# Patient Record
Sex: Female | Born: 1935 | Race: White | Smoking: Former smoker
Health system: Southern US, Community
[De-identification: ages and names within clinical notes are randomized; demographics above are authoritative.]

## PROBLEM LIST (undated history)

## (undated) DIAGNOSIS — I2510 Atherosclerotic heart disease of native coronary artery without angina pectoris: Secondary | ICD-10-CM

## (undated) DIAGNOSIS — Z720 Tobacco use: Secondary | ICD-10-CM

## (undated) DIAGNOSIS — N179 Acute kidney failure, unspecified: Secondary | ICD-10-CM

## (undated) DIAGNOSIS — J449 Chronic obstructive pulmonary disease, unspecified: Secondary | ICD-10-CM

## (undated) DIAGNOSIS — I5043 Acute on chronic combined systolic (congestive) and diastolic (congestive) heart failure: Secondary | ICD-10-CM

## (undated) DIAGNOSIS — U071 COVID-19: Secondary | ICD-10-CM

## (undated) DIAGNOSIS — E119 Type 2 diabetes mellitus without complications: Secondary | ICD-10-CM

## (undated) DIAGNOSIS — I10 Essential (primary) hypertension: Secondary | ICD-10-CM

## (undated) DIAGNOSIS — I350 Nonrheumatic aortic (valve) stenosis: Secondary | ICD-10-CM

## (undated) DIAGNOSIS — E785 Hyperlipidemia, unspecified: Secondary | ICD-10-CM

## (undated) DIAGNOSIS — H548 Legal blindness, as defined in USA: Secondary | ICD-10-CM

## (undated) DIAGNOSIS — K5792 Diverticulitis of intestine, part unspecified, without perforation or abscess without bleeding: Secondary | ICD-10-CM

## (undated) DIAGNOSIS — K219 Gastro-esophageal reflux disease without esophagitis: Secondary | ICD-10-CM

## (undated) HISTORY — DX: Nonrheumatic aortic (valve) stenosis: I35.0

## (undated) HISTORY — PX: APPENDECTOMY: SHX54

## (undated) HISTORY — DX: Hyperlipidemia, unspecified: E78.5

## (undated) HISTORY — DX: Acute kidney failure, unspecified: N17.9

## (undated) HISTORY — DX: Diverticulitis of intestine, part unspecified, without perforation or abscess without bleeding: K57.92

## (undated) HISTORY — DX: Gastro-esophageal reflux disease without esophagitis: K21.9

## (undated) HISTORY — DX: Atherosclerotic heart disease of native coronary artery without angina pectoris: I25.10

## (undated) HISTORY — DX: Essential (primary) hypertension: I10

## (undated) HISTORY — DX: Acute on chronic combined systolic (congestive) and diastolic (congestive) heart failure: I50.43

## (undated) HISTORY — DX: Type 2 diabetes mellitus without complications: E11.9

## (undated) HISTORY — DX: Chronic obstructive pulmonary disease, unspecified: J44.9

## (undated) HISTORY — PX: ABDOMINAL HYSTERECTOMY: SHX81

## (undated) HISTORY — PX: CATARACT EXTRACTION, BILATERAL: SHX1313

## (undated) HISTORY — DX: Tobacco use: Z72.0

## (undated) HISTORY — PX: BACK SURGERY: SHX140

## (undated) HISTORY — PX: CHOLECYSTECTOMY: SHX55

## (undated) HISTORY — DX: COVID-19: U07.1

## (undated) HISTORY — DX: Legal blindness, as defined in USA: H54.8

---

## 2014-05-03 DIAGNOSIS — I359 Nonrheumatic aortic valve disorder, unspecified: Secondary | ICD-10-CM | POA: Insufficient documentation

## 2014-05-03 DIAGNOSIS — E559 Vitamin D deficiency, unspecified: Secondary | ICD-10-CM | POA: Insufficient documentation

## 2014-05-03 DIAGNOSIS — J449 Chronic obstructive pulmonary disease, unspecified: Secondary | ICD-10-CM | POA: Insufficient documentation

## 2014-05-03 DIAGNOSIS — E7800 Pure hypercholesterolemia, unspecified: Secondary | ICD-10-CM | POA: Insufficient documentation

## 2014-05-03 DIAGNOSIS — M2392 Unspecified internal derangement of left knee: Secondary | ICD-10-CM | POA: Insufficient documentation

## 2014-05-03 DIAGNOSIS — E119 Type 2 diabetes mellitus without complications: Secondary | ICD-10-CM | POA: Insufficient documentation

## 2014-05-03 DIAGNOSIS — J4489 Other specified chronic obstructive pulmonary disease: Secondary | ICD-10-CM | POA: Insufficient documentation

## 2014-05-03 DIAGNOSIS — I10 Essential (primary) hypertension: Secondary | ICD-10-CM | POA: Insufficient documentation

## 2014-05-03 DIAGNOSIS — K219 Gastro-esophageal reflux disease without esophagitis: Secondary | ICD-10-CM | POA: Insufficient documentation

## 2014-08-13 DIAGNOSIS — M23309 Other meniscus derangements, unspecified meniscus, unspecified knee: Secondary | ICD-10-CM | POA: Insufficient documentation

## 2014-09-10 ENCOUNTER — Encounter: Payer: Self-pay | Admitting: Cardiovascular Disease

## 2014-09-10 ENCOUNTER — Ambulatory Visit (INDEPENDENT_AMBULATORY_CARE_PROVIDER_SITE_OTHER): Payer: Medicare Other | Admitting: Cardiovascular Disease

## 2014-09-10 VITALS — BP 140/80 | HR 68 | Ht 60.0 in | Wt 174.0 lb

## 2014-09-10 DIAGNOSIS — Z01810 Encounter for preprocedural cardiovascular examination: Secondary | ICD-10-CM

## 2014-09-10 DIAGNOSIS — I10 Essential (primary) hypertension: Secondary | ICD-10-CM

## 2014-09-10 DIAGNOSIS — R011 Cardiac murmur, unspecified: Secondary | ICD-10-CM

## 2014-09-10 NOTE — Progress Notes (Signed)
History of Present Illness: 81 yo female with history of DM, COPD, HTN, HLD, GERD and aortic valve disease referred today as a new patient for cardiac evaluation prior to planned left knee replacement. She has been evaluated by Dr. Noemi Chapel and surgery is being discussed but not yet arranged. She has well controlled BP, DM and lipids per pt. No known heart disease. She has been told for several years that she has a heart murmur. No recent echo. She denies any chest pain, SOB, palpitations, near syncope or syncope. She feels well. Only limited by chronic back pain and knee pain. She has smoked 1 ppd for 70 years.   Primary Care Physician: Bennie Dallas   Past Medical History  Diagnosis Date  . Diabetes mellitus   . COPD (chronic obstructive pulmonary disease)   . Hyperlipidemia   . HTN (hypertension)   . Tobacco abuse     Past Surgical History  Procedure Laterality Date  . Appendectomy    . Back surgery    . Cholecystectomy      Current Outpatient Prescriptions  Medication Sig Dispense Refill  . acetaminophen (TYLENOL) 325 MG tablet Take 650 mg by mouth every 6 (six) hours as needed (SEVERE PAIN).    [name] amLODipine (NORVASC) 10 MG tablet Take 10 mg by mouth.   1  . aspirin 81 MG tablet Take 81 mg by mouth daily.    [name] atenolol (TENORMIN) 100 MG tablet Take 100 mg by mouth daily.   1  . atorvastatin (LIPITOR) 20 MG tablet Take 20 mg by mouth daily at 6 PM.   4  . Cholecalciferol (VITAMIN D-3 PO) Take 5,000 Units by mouth daily.    . Coenzyme Q10-Fish Oil-Vit E (CO-Q 10 OMEGA-3 FISH OIL PO) Take 400 mg by mouth daily.    . fenofibrate 160 MG tablet Take 160 mg by mouth daily.   7  . losartan (COZAAR) 100 MG tablet Take 100 mg by mouth daily.   1  . metFORMIN (GLUCOPHAGE) 500 MG tablet Take 500 mg by mouth 2 (two) times daily.   1  . Multiple Vitamins-Minerals (WOMENS 50+ MULTI VITAMIN/MIN PO) Take 1 tablet by mouth daily. CENTRUM SILVER    . PROAIR HFA 108 (90 BASE) MCG/ACT inhaler  Inhale 2 puffs into the lungs as needed (FOR ALLERGIES). ALLERGIES  3  . terazosin (HYTRIN) 10 MG capsule Take 10 mg by mouth at bedtime.   1  . traMADol (ULTRAM) 50 MG tablet Take by mouth every 6 (six) hours as needed for moderate pain.   0   No current facility-administered medications for this visit.    Allergies  Allergen Reactions  . Amlodipine Besy-Benazepril Hcl Other (See Comments)    cough  . Hctz [Hydrochlorothiazide] Other (See Comments)    History   Social History  . Marital Status: Widowed    Spouse Name: N/A    Number of Children: 2  . Years of Education: N/A   Occupational History  . Retired    Social History Main Topics  . Smoking status: Current Every Day Smoker -- 1.00 packs/day for 70 years    Types: Cigarettes  . Smokeless tobacco: Not on file  . Alcohol Use: No  . Drug Use: No  . Sexual Activity: Not on file   Other Topics Concern  . Not on file   Social History Narrative  . No narrative on file    Family History  Problem Relation Age of Onset  .  Stroke Brother   . Diabetes Mother   . Coronary artery disease Neg Hx     Review of Systems:  As stated in the HPI and otherwise negative.   BP 140/80 mmHg  Pulse 68  Ht 5' (1.524 m)  Wt 174 lb (78.926 kg)  BMI 33.98 kg/m2  SpO2 97%  Physical Examination: General: Well developed, well nourished, NAD HEENT: OP clear, mucus membranes moist SKIN: warm, dry. No rashes. Neuro: No focal deficits Musculoskeletal: Muscle strength 5/5 all ext Psychiatric: Mood and affect normal Neck: No JVD, no carotid bruits, no thyromegaly, no lymphadenopathy. Lungs:Clear bilaterally, no wheezes, rhonci, crackles Cardiovascular: Regular rate and rhythm. Systolic murmur. No gallops or rubs. Abdomen:Soft. Bowel sounds present. Non-tender.  Extremities: No lower extremity edema. Pulses are 2 + in the bilateral DP/PT.  EKG: NSR, rate 68 bpm. Non-specific ST abnormality.   Assessment and Plan:   1.  Pre-operative cardiovascular examination: She has risk factors for CAD including 70 years of tobacco abuse, DM, HTN, HLD and advanced age. She has a planned left knee replacement. I think she needs ischemic testing before her surgical procedure. Will arrange Lexiscan stress myoview. She cannot walk on a treadmill due to knee pain and back pain. I will review the studies and then make formal recommendations for her risk.   2. Cardiac murmur: Will arrange echo to assess. This is a systolic murmur and sounds most c/w an aortic stenosis etiology but the quality is slightly different over the left precordium. There may also be mitral regurgitation.   3. Tobacco abuse: Smoking cessation recommended.   4. HTN: BP controlled. No changes today.

## 2014-09-10 NOTE — Patient Instructions (Signed)
Your physician recommends that you schedule a follow-up appointment in: about 5-6 weeks.   Your physician has requested that you have an echocardiogram. Echocardiography is a painless test that uses sound waves to create images of your heart. It provides your doctor with information about the size and shape of your heart and how well your heart's chambers and valves are working. This procedure takes approximately one hour. There are no restrictions for this procedure.  Your physician has requested that you have a lexiscan myoview. For further information please visit [email or web address]. Please follow instruction sheet, as given.

## 2014-09-13 ENCOUNTER — Ambulatory Visit (HOSPITAL_BASED_OUTPATIENT_CLINIC_OR_DEPARTMENT_OTHER): Payer: Medicare Other | Admitting: Radiology

## 2014-09-13 ENCOUNTER — Ambulatory Visit (HOSPITAL_COMMUNITY): Payer: Medicare Other | Attending: Cardiovascular Disease | Admitting: Radiology

## 2014-09-13 DIAGNOSIS — E119 Type 2 diabetes mellitus without complications: Secondary | ICD-10-CM | POA: Diagnosis not present

## 2014-09-13 DIAGNOSIS — Z720 Tobacco use: Secondary | ICD-10-CM | POA: Diagnosis not present

## 2014-09-13 DIAGNOSIS — Z01810 Encounter for preprocedural cardiovascular examination: Secondary | ICD-10-CM

## 2014-09-13 DIAGNOSIS — I10 Essential (primary) hypertension: Secondary | ICD-10-CM

## 2014-09-13 DIAGNOSIS — I2510 Atherosclerotic heart disease of native coronary artery without angina pectoris: Secondary | ICD-10-CM | POA: Insufficient documentation

## 2014-09-13 DIAGNOSIS — R011 Cardiac murmur, unspecified: Secondary | ICD-10-CM

## 2014-09-13 DIAGNOSIS — Z01818 Encounter for other preprocedural examination: Secondary | ICD-10-CM | POA: Diagnosis not present

## 2014-09-13 DIAGNOSIS — J449 Chronic obstructive pulmonary disease, unspecified: Secondary | ICD-10-CM | POA: Insufficient documentation

## 2014-09-13 DIAGNOSIS — E785 Hyperlipidemia, unspecified: Secondary | ICD-10-CM | POA: Diagnosis not present

## 2014-09-13 MED ORDER — TECHNETIUM TC 99M SESTAMIBI GENERIC - CARDIOLITE
11.0000 | Freq: Once | INTRAVENOUS | Status: AC | PRN
  Administered 2014-09-13: 11 via INTRAVENOUS

## 2014-09-13 MED ORDER — TECHNETIUM TC 99M SESTAMIBI GENERIC - CARDIOLITE
33.0000 | Freq: Once | INTRAVENOUS | Status: AC | PRN
  Administered 2014-09-13: 33 via INTRAVENOUS

## 2014-09-13 MED ORDER — REGADENOSON 0.4 MG/5ML IV SOLN
0.4000 mg | Freq: Once | INTRAVENOUS | Status: AC
  Administered 2014-09-13: 0.4 mg via INTRAVENOUS

## 2014-09-13 NOTE — Progress Notes (Signed)
[facility] [place], [place] [address] [phone]    Cardiology Nuclear Med Study  [place] is a 81 y.o. female     MRN : [id number]     DOB: 1927/12/16  Procedure Date: 09/13/2014  Nuclear Med Background Indication for Stress Test:  Evaluation for Ischemia and Surgical Clearance History:  CAD; Cardiac Risk Factors: Hypertension, Lipids, NIDDM, Smoker and COPD  Symptoms: NO SYMPTOMS   Nuclear Pre-Procedure Caffeine/Decaff Intake:  None NPO After: 5:00am   Lungs:  clear O2 Sat: 98% on room air. IV 0.9% NS with Angio Cath:  22g  IV Site: L Antecubital  IV Started by:  Ileene Hutchinson, EMT-P  Chest Size (in):  38 Cup Size: D  Height: 5' (1.524 m)  Weight:  174 lb (78.926 kg)  BMI:  Body mass index is 33.98 kg/(m^2). Tech Comments:  Withheld tenormin    Nuclear Med Study 1 or 2 day study: 1 day  Stress Test Type:  Lexiscan  Reading MD: n/a  Order Authorizing Provider:  Dr.C.McAlhany  Resting Radionuclide: Technetium 62m Sestamibi  Resting Radionuclide Dose: 11.0 mCi   Stress Radionuclide:  Technetium 48m Sestamibi  Stress Radionuclide Dose: 33.0 mCi           Stress Protocol Rest HR: 74 Stress HR: 88  Rest BP: 168/73 Stress BP: 144/53  Exercise Time (min): n/a METS: n/a   Predicted Max HR: 136 bpm % Max HR: 64.71 bpm Rate Pressure Product: 13728   Dose of Adenosine (mg):  n/a Dose of Lexiscan: 0.4 mg  Dose of Atropine (mg): n/a Dose of Dobutamine: n/a mcg/kg/min (at max HR)  Stress Test Technologist: Glade Lloyd, BS-ES  Nuclear Technologist:  Earl Many, CNMT     Rest Procedure:  Myocardial perfusion imaging was performed at rest 45 minutes following the intravenous administration of Technetium 29m Sestamibi. Rest ECG: NSR - Normal EKG  Stress Procedure:  The patient received IV Lexiscan 0.4 mg over 15-seconds.  Technetium 28m Sestamibi injected at 30-seconds.  Quantitative spect images were obtained after  a 45 minute delay.  During the infusion of Lexiscan the patient complained of SOB that resolved in recovery.  Stress ECG: No significant change from baseline ECG  QPS Raw Data Images:  Normal; no motion artifact; normal heart/lung ratio. Stress Images:  Decreased uptake in the distal anterior and lateral walls and in the true apex.  Rest Images:  Normal homogeneous uptake in all areas of the myocardium. Subtraction (SDS):  SDS 5.  Transient Ischemic Dilatation (Normal <1.22):  1.31 Lung/Heart Ratio (Normal <0.45):  0.31  Quantitative Gated Spect Images QGS EDV:  112 ml QGS ESV:  48 ml  Impression Exercise Capacity:  Lexiscan with no exercise. BP Response:  Normal blood pressure response. Clinical Symptoms:  There is dyspnea. ECG Impression:  No significant ST segment change suggestive of ischemia. Comparison with Prior Nuclear Study: No images to compare  Overall Impression:  Intermediate risk stress nuclear study there is smal area moderate severity reversible defect in the distal anterior and lateral walls and in the apex. SDS 5.   LV Ejection Fraction: 58%.  LV Wall Motion:  NL LV Function; NL Wall Motion    Dorothy Spark 09/13/2014'

## 2014-09-13 NOTE — Progress Notes (Signed)
Echocardiogram performed.  

## 2014-09-18 ENCOUNTER — Ambulatory Visit (INDEPENDENT_AMBULATORY_CARE_PROVIDER_SITE_OTHER): Payer: Medicare Other | Admitting: Cardiovascular Disease

## 2014-09-18 ENCOUNTER — Encounter: Payer: Self-pay | Admitting: Cardiovascular Disease

## 2014-09-18 VITALS — BP 150/72 | HR 68 | Ht 60.0 in | Wt 172.8 lb

## 2014-09-18 DIAGNOSIS — I350 Nonrheumatic aortic (valve) stenosis: Secondary | ICD-10-CM

## 2014-09-18 DIAGNOSIS — Z01810 Encounter for preprocedural cardiovascular examination: Secondary | ICD-10-CM

## 2014-09-18 DIAGNOSIS — Z720 Tobacco use: Secondary | ICD-10-CM

## 2014-09-18 DIAGNOSIS — I10 Essential (primary) hypertension: Secondary | ICD-10-CM

## 2014-09-18 NOTE — Patient Instructions (Signed)
Your physician recommends that you schedule a follow-up appointment in: 3 months. Scheduled for Dec 25, 2014 at 9:00

## 2014-09-18 NOTE — Progress Notes (Signed)
History of Present Illness: 81 yo female with history of DM, COPD, HTN, HLD, GERD and aortic valve disease here today for cardiac follow up. I saw her 09/10/14 as a new patient for cardiac evaluation prior to planned left knee replacement. She has been evaluated by Dr. Noemi Chapel and surgery is being discussed but not yet arranged. She has well controlled BP, DM and lipids per pt. No known heart disease. She has been told for several years that she has a heart murmur. No recent echo. She denied any chest pain, SOB, palpitations, near syncope or syncope. Only limited by chronic back pain and knee pain. She has smoked 1 ppd for 70 years. I arranged an echo and a stress myoview. Echo 09/13/14 with normal LV function, moderate AS. Stress myoview 09/13/14 intermediate risk study with small area of possible ischemia in the distal anterior and lateral walls.   She is here today for follow up. NO changes since last visit. No chest pain or SOB. Left knee pain is biggest complaint.   Primary Care Physician: Bennie Dallas   Past Medical History  Diagnosis Date  . Diabetes mellitus   . COPD (chronic obstructive pulmonary disease)   . Hyperlipidemia   . HTN (hypertension)   . Tobacco abuse     Past Surgical History  Procedure Laterality Date  . Appendectomy    . Back surgery    . Cholecystectomy      Current Outpatient Prescriptions  Medication Sig Dispense Refill  . acetaminophen (TYLENOL) 325 MG tablet Take 650 mg by mouth every 6 (six) hours as needed (SEVERE PAIN).    [name] amLODipine (NORVASC) 10 MG tablet Take 10 mg by mouth.   1  . aspirin 81 MG tablet Take 81 mg by mouth daily.    [name] atenolol (TENORMIN) 100 MG tablet Take 100 mg by mouth daily.   1  . atorvastatin (LIPITOR) 20 MG tablet Take 20 mg by mouth daily at 6 PM.   4  . Cholecalciferol (VITAMIN D-3 PO) Take 5,000 Units by mouth daily.    . Coenzyme Q10-Fish Oil-Vit E (CO-Q 10 OMEGA-3 FISH OIL PO) Take 400 mg by mouth daily.    .  fenofibrate 160 MG tablet Take 160 mg by mouth daily.   7  . losartan (COZAAR) 100 MG tablet Take 100 mg by mouth daily.   1  . metFORMIN (GLUCOPHAGE) 500 MG tablet Take 500 mg by mouth 2 (two) times daily.   1  . Multiple Vitamins-Minerals (WOMENS 50+ MULTI VITAMIN/MIN PO) Take 1 tablet by mouth daily. CENTRUM SILVER    . PROAIR HFA 108 (90 BASE) MCG/ACT inhaler Inhale 2 puffs into the lungs as needed (FOR ALLERGIES). ALLERGIES  3  . terazosin (HYTRIN) 10 MG capsule Take 10 mg by mouth at bedtime.   1  . traMADol (ULTRAM) 50 MG tablet Take by mouth every 6 (six) hours as needed for moderate pain.   0   No current facility-administered medications for this visit.    Allergies  Allergen Reactions  . Amlodipine Besy-Benazepril Hcl Other (See Comments)    cough  . Hctz [Hydrochlorothiazide] Other (See Comments)    History   Social History  . Marital Status: Widowed    Spouse Name: N/A    Number of Children: 2  . Years of Education: N/A   Occupational History  . Retired    Social History Main Topics  . Smoking status: Current Every Day Smoker -- 1.00  packs/day for 70 years    Types: Cigarettes  . Smokeless tobacco: Not on file  . Alcohol Use: No  . Drug Use: No  . Sexual Activity: Not on file   Other Topics Concern  . Not on file   Social History Narrative    Family History  Problem Relation Age of Onset  . Stroke Brother   . Diabetes Mother   . Coronary artery disease Neg Hx     Review of Systems:  As stated in the HPI and otherwise negative.   BP 150/72 mmHg  Pulse 68  Ht 5' (1.524 m)  Wt 172 lb 12.8 oz (78.382 kg)  BMI 33.75 kg/m2  SpO2 94%  Physical Examination: General: Well developed, well nourished, NAD HEENT: OP clear, mucus membranes moist SKIN: warm, dry. No rashes. Neuro: No focal deficits Musculoskeletal: Muscle strength 5/5 all ext Psychiatric: Mood and affect normal Neck: No JVD, no carotid bruits, no thyromegaly, no  lymphadenopathy. Lungs:Clear bilaterally, no wheezes, rhonci, crackles Cardiovascular: Regular rate and rhythm. Systolic murmur. No gallops or rubs. Abdomen:Soft. Bowel sounds present. Non-tender.  Extremities: No lower extremity edema. Pulses are 2 + in the bilateral DP/PT.  Echo 09/13/14: Left ventricle: The cavity size was normal. Wall thickness was increased in a pattern of mild LVH. Systolic function was normal. The estimated ejection fraction was in the range of 55% to 60%. Doppler parameters are consistent with abnormal left ventricular relaxation (grade 1 diastolic dysfunction). - Aortic valve: Mean gradient (S): 28 mm Hg. Peak gradient (S): 47 mm Hg. Valve area (VTI): 1.18 cm^2. Valve area (Vmax): 1.07 cm^2. Valve area (Vmean): 1.07 cm^2. - Mitral valve: There was mild regurgitation. - Left atrium: The atrium was moderately dilated.  Stress myoview 09/13/13: Stress Procedure: The patient received IV Lexiscan 0.4 mg over 15-seconds. Technetium 36m Sestamibi injected at 30-seconds. Quantitative spect images were obtained after a 45 minute delay. During the infusion of Lexiscan the patient complained of SOB that resolved in recovery. Stress ECG: No significant change from baseline ECG QPS Raw Data Images: Normal; no motion artifact; normal heart/lung ratio. Stress Images: Decreased uptake in the distal anterior and lateral walls and in the true apex.  Rest Images: Normal homogeneous uptake in all areas of the myocardium. Subtraction (SDS): SDS 5.  Transient Ischemic Dilatation (Normal <1.22): 1.31 Lung/Heart Ratio (Normal <0.45): 0.31  Quantitative Gated Spect Images QGS EDV: 112 ml QGS ESV: 48 ml  Impression Exercise Capacity: Lexiscan with no exercise. BP Response: Normal blood pressure response. Clinical Symptoms: There is dyspnea. ECG Impression: No significant ST segment change suggestive of ischemia. Comparison with Prior Nuclear Study: No  images to compare  Overall Impression: Intermediate risk stress nuclear study there is smal area moderate severity reversible defect in the distal anterior and lateral walls and in the apex. SDS 5.   LV Ejection Fraction: 58%. LV Wall Motion: NL LV Function; NL Wall Motion    Assessment and Plan:   1. Pre-operative cardiovascular examination: Stress test 09/13/14 with small area of possible ischemia. She is asymptomatic with no chest pain or dyspnea with exertion. We have discussed cardiac cath but she is asymptomatic. Given her long history of tobacco abuse, DM and HTN, she has a high probability of obstructive CAD. Her risk with general anesthesia would not be low. If she proceeded with the surgery, I would plan a cath but she will likely not schedule the knee surgery. I will see her back in 3 months. If she has any  chest pain or change in clinical status before then she will call.   2. Aortic stenosis:  Moderate by echo 09/13/13. Will follow.   3. Tobacco abuse: Smoking cessation recommended.   4. HTN: BP controlled. No changes today.

## 2014-10-17 ENCOUNTER — Ambulatory Visit: Payer: Medicare Other | Admitting: Cardiovascular Disease

## 2014-12-25 ENCOUNTER — Ambulatory Visit: Payer: Medicare Other | Admitting: Cardiovascular Disease

## 2017-12-01 DIAGNOSIS — I6523 Occlusion and stenosis of bilateral carotid arteries: Secondary | ICD-10-CM | POA: Insufficient documentation

## 2018-01-25 ENCOUNTER — Ambulatory Visit (INDEPENDENT_AMBULATORY_CARE_PROVIDER_SITE_OTHER): Payer: Medicare Other | Admitting: Family Medicine

## 2018-01-25 ENCOUNTER — Other Ambulatory Visit: Payer: Self-pay

## 2018-01-25 ENCOUNTER — Encounter: Payer: Self-pay | Admitting: Family Medicine

## 2018-01-25 ENCOUNTER — Ambulatory Visit (INDEPENDENT_AMBULATORY_CARE_PROVIDER_SITE_OTHER): Payer: Medicare Other

## 2018-01-25 VITALS — BP 128/62 | HR 55 | Temp 97.4°F | Ht 60.0 in | Wt 151.6 lb

## 2018-01-25 DIAGNOSIS — I350 Nonrheumatic aortic (valve) stenosis: Secondary | ICD-10-CM | POA: Diagnosis not present

## 2018-01-25 DIAGNOSIS — J069 Acute upper respiratory infection, unspecified: Secondary | ICD-10-CM | POA: Diagnosis not present

## 2018-01-25 DIAGNOSIS — R062 Wheezing: Secondary | ICD-10-CM

## 2018-01-25 DIAGNOSIS — R0602 Shortness of breath: Secondary | ICD-10-CM | POA: Diagnosis not present

## 2018-01-25 DIAGNOSIS — R0789 Other chest pain: Secondary | ICD-10-CM

## 2018-01-25 LAB — POCT CBC
GRANULOCYTE PERCENT: 81.7 % — AB (ref 37–80)
HCT, POC: 39.7 % (ref 37.7–47.9)
Hemoglobin: 12.6 g/dL (ref 12.2–16.2)
Lymph, poc: 1.1 (ref 0.6–3.4)
MCH: 29.2 pg (ref 27–31.2)
MCHC: 31.8 g/dL (ref 31.8–35.4)
MCV: 91.8 fL (ref 80–97)
MID (cbc): 0.2 (ref 0–0.9)
MPV: 7.3 fL (ref 0–99.8)
PLATELET COUNT, POC: 346 10*3/uL (ref 142–424)
POC Granulocyte: 6 (ref 2–6.9)
POC LYMPH PERCENT: 15.2 %L (ref 10–50)
POC MID %: 3.1 %M (ref 0–12)
RBC: 4.32 M/uL (ref 4.04–5.48)
RDW, POC: 1301 %
WBC: 7.4 10*3/uL (ref 4.6–10.2)

## 2018-01-25 MED ORDER — PROAIR HFA 108 (90 BASE) MCG/ACT IN AERS: INHALATION_SPRAY | RESPIRATORY_TRACT | 3 refills | Status: DC

## 2018-01-25 MED ORDER — FLUTICASONE PROPIONATE 50 MCG/ACT NA SUSP: 2.0000 | Freq: Every day | NASAL | 0 refills | Status: DC

## 2018-01-25 MED ORDER — CEFDINIR 300 MG PO CAPS: 600.0000 mg | ORAL_CAPSULE | Freq: Every day | ORAL | 0 refills | Status: DC

## 2018-01-25 NOTE — Progress Notes (Signed)
Patient ID: [place], female    DOB: [birth year]  Age: 84 y.o. MRN: [id number]  Chief Complaint  Patient presents with  . Nasal Congestion    x started x 1 week ago Denies fever  . Cough    yellow mucus  . Shortness of Breath    Subjective:  [place] who recently had carotid surgeries done in [place] and needs to have aortic stenosis repair sometime.  Other than that she has done well until last week when she got a cold she had sneezing and congestion and some coughing she is felt short of breath and has had bilateral rib cage pain no fevers.  She has a history of having had a pneumonia in the past.  She has moved here to be with family, having lived in [place] until now.  Care everywhere [facility] from [facility] reviewed.  Review of systems: Constitutional: Feels little tired Cardiovascular: As above.  No anterior chest pains. Respiratory: As above HEENT: As above GI: Unremarkable GU: Unremarkable Musculoskeletal: Unremarkable Neurologic: Unremarkable Psychiatric: Unremarkable.  Mental status stays good. Dermatologic unremarkable  Current allergies, medications, problem list, past/family and social histories reviewed.  Objective:  BP 128/62 (BP Location: Right Arm, Patient Position: Sitting, Cuff Size: Normal)   Pulse (!) 55   Temp (!) 97.4 F (36.3 C) (Oral)   Ht 5' (1.524 m)   Wt 151 lb 9.6 oz (68.8 kg)   SpO2 90%   BMI 29.61 kg/m  [place]: Alert and oriented.  Mildly short of breath.  TMs normal.  Throat not erythematous.  Neck supple without significant nodes.  Chest tube wheezes on auscultation.  Heart regular.  Murmur grade 4 out of 6 of aortic stenosis.  Abdomen soft.  Extremities without edema.  CBC and chest x-ray report reviewed  Assessment & Plan:   Assessment: 1. Acute upper respiratory infection   2. Shortness of breath   3. Wheeze   4. Chest wall pain   5. Aortic valve stenosis, etiology of cardiac valve disease  unspecified       Plan: See instructions  Orders Placed This Encounter  Procedures  . DG Chest 2 View    Standing Status:   Future    Number of Occurrences:   1    Standing Expiration Date:   01/25/2019    Order Specific Question:   Reason for Exam (SYMPTOM  OR DIAGNOSIS REQUIRED)    Answer:   mild cough, wheeze, aortic stenosis, short of breath, chest wall pains in lateral ribs with breathing deep    Order Specific Question:   Preferred imaging location?    Answer:   External  . POCT CBC    Meds ordered this encounter  Medications  . cefdinir (OMNICEF) 300 MG capsule    Sig: Take 2 capsules (600 mg total) by mouth daily.    Dispense:  20 capsule    Refill:  0  . PROAIR HFA 108 (90 Base) MCG/ACT inhaler    Sig: Use 2 inhalations every 4-6 hours as needed for wheezing or coughing.    Dispense:  1 Inhaler    Refill:  3  . fluticasone (FLONASE) 50 MCG/ACT nasal spray    Sig: Place 2 sprays into both nostrils daily.    Dispense:  16 g    Refill:  0         Patient Instructions   Drink plenty of fluids to stay well-hydrated.  Avoid excessive salt  Take Omnicef cyst  Ceftin ear) 300 mg 1 twice daily  Albuterol inhaler 2 inhalations every 4-6 hours if needed for coughing or wheezing  Flonase 2 sprays each nostril daily for nasal symptoms and congestion.  Can use twice daily for a few days if necessary.  If wheezing gets worse you may need to take a course of prednisone, return if needed.       IF you received an x-ray today, you will receive an invoice from [facility]. Please contact [facility] at [phone] with questions or concerns regarding your invoice.   IF you received labwork today, you will receive an invoice from [facility]. Please contact [facility] at [phone] with questions or concerns regarding your invoice.   Our billing staff will not be able to assist you with questions regarding bills from these companies.  You will be  contacted with the lab results as soon as they are available. The fastest way to get your results is to activate your My Chart account. Instructions are located on the last page of this paperwork. If you have not heard from [place] regarding the results in 2 weeks, please contact this office.         No follow-ups on file.   [name], MD 01/25/2018

## 2018-01-25 NOTE — Patient Instructions (Addendum)
Drink plenty of fluids to stay well-hydrated.  Avoid excessive salt  Take Omnicef cyst Ceftin ear) 300 mg 1 twice daily  Albuterol inhaler 2 inhalations every 4-6 hours if needed for coughing or wheezing  Flonase 2 sprays each nostril daily for nasal symptoms and congestion.  Can use twice daily for a few days if necessary.  If wheezing gets worse you may need to take a course of prednisone, return if needed.       IF you received an x-ray today, you will receive an invoice from [facility]. Please contact [facility] at [phone] with questions or concerns regarding your invoice.   IF you received labwork today, you will receive an invoice from [place] [facility]. Please contact [facility] at [phone] with questions or concerns regarding your invoice.   Our billing staff will not be able to assist you with questions regarding bills from these companies.  You will be contacted with the lab results as soon as they are available. The fastest way to get your results is to activate your My Chart account. Instructions are located on the last page of this paperwork. If you have not heard from [place] regarding the results in 2 weeks, please contact this office.

## 2019-02-21 ENCOUNTER — Other Ambulatory Visit: Payer: Self-pay

## 2019-02-21 ENCOUNTER — Ambulatory Visit (INDEPENDENT_AMBULATORY_CARE_PROVIDER_SITE_OTHER): Payer: Medicare Other | Admitting: Adult Health

## 2019-02-21 ENCOUNTER — Encounter: Payer: Self-pay | Admitting: Adult Health

## 2019-02-21 VITALS — BP 130/70 | HR 69 | Temp 97.5°F | Wt 139.6 lb

## 2019-02-21 DIAGNOSIS — I10 Essential (primary) hypertension: Secondary | ICD-10-CM | POA: Diagnosis not present

## 2019-02-21 DIAGNOSIS — E1169 Type 2 diabetes mellitus with other specified complication: Secondary | ICD-10-CM | POA: Diagnosis not present

## 2019-02-21 DIAGNOSIS — I359 Nonrheumatic aortic valve disorder, unspecified: Secondary | ICD-10-CM | POA: Diagnosis not present

## 2019-02-21 DIAGNOSIS — J449 Chronic obstructive pulmonary disease, unspecified: Secondary | ICD-10-CM | POA: Diagnosis not present

## 2019-02-21 DIAGNOSIS — E7800 Pure hypercholesterolemia, unspecified: Secondary | ICD-10-CM

## 2019-02-21 DIAGNOSIS — Z0000 Encounter for general adult medical examination without abnormal findings: Secondary | ICD-10-CM

## 2019-02-21 DIAGNOSIS — I6523 Occlusion and stenosis of bilateral carotid arteries: Secondary | ICD-10-CM | POA: Diagnosis not present

## 2019-02-21 LAB — CBC WITH DIFFERENTIAL/PLATELET
Basophils Absolute: 0 10*3/uL (ref 0.0–0.1)
Basophils Relative: 0.6 % (ref 0.0–3.0)
Eosinophils Absolute: 0.1 10*3/uL (ref 0.0–0.7)
Eosinophils Relative: 0.9 % (ref 0.0–5.0)
HCT: 38.5 % (ref 36.0–46.0)
Hemoglobin: 12.7 g/dL (ref 12.0–15.0)
Lymphocytes Relative: 17.5 % (ref 12.0–46.0)
Lymphs Abs: 1.1 10*3/uL (ref 0.7–4.0)
MCHC: 33.1 g/dL (ref 30.0–36.0)
MCV: 91 fl (ref 78.0–100.0)
Monocytes Absolute: 0.5 10*3/uL (ref 0.1–1.0)
Monocytes Relative: 7.6 % (ref 3.0–12.0)
Neutro Abs: 4.8 10*3/uL (ref 1.4–7.7)
Neutrophils Relative %: 73.4 % (ref 43.0–77.0)
Platelets: 320 10*3/uL (ref 150.0–400.0)
RBC: 4.23 Mil/uL (ref 3.87–5.11)
RDW: 13.7 % (ref 11.5–15.5)
WBC: 6.5 10*3/uL (ref 4.0–10.5)

## 2019-02-21 LAB — BASIC METABOLIC PANEL
BUN: 18 mg/dL (ref 6–23)
CO2: 27 mEq/L (ref 19–32)
Calcium: 9.4 mg/dL (ref 8.4–10.5)
Chloride: 106 mEq/L (ref 96–112)
Creatinine, Ser: 0.67 mg/dL (ref 0.40–1.20)
GFR: 82.87 mL/min (ref >60.00)
Glucose, Bld: 101 mg/dL — ABNORMAL HIGH (ref 70–99)
Potassium: 3.8 mEq/L (ref 3.5–5.1)
Sodium: 145 mEq/L (ref 135–145)

## 2019-02-21 LAB — HEMOGLOBIN A1C: Hgb A1c MFr Bld: 5.7 % (ref 4.6–6.5)

## 2019-02-21 NOTE — Progress Notes (Signed)
Patient presents to clinic today to establish care. She is a pleasant 85 year old female who  has a past medical history of COPD (chronic obstructive pulmonary disease) ([place]), Diabetes mellitus ([place]), HTN (hypertension), Hyperlipidemia, and Tobacco abuse.   Recently moved from [place], [place] to live with her son.   Acute Concerns: Establish Care  Chronic Issues:  Aortic Valve Stenosis -is monitored by Dr. Ardeth Sportsman and Dr. Elba Barman at [facility] heart and vascular in [place].  She is scheduled for TAVR procedure on July 22.Currentlly prescribed Plavix.   DM - diet controlled. She does not know when her last A1c was done or what her reading was.   Hyperlipidemia/CAD/Bilateral carotid Artery Stenosis - Takes lipitor and fenofibrate.  Her last carotid artery ultrasound was in April 2019.  Showed 70 to 99% bilateral internal carotid artery stenosis which was felt to be greater on the right. This is managed by Cardiology.   Hypertension - Controlled with Losartan, atenolol, norvasc, and terazosin   COPD - uses Proair when needed. Denies shortness of breath   Chronic Back Pain - takes Tramadol PRN   Tobacco Use - Continues to smoke about 2 cigarette per day.    Health Maintenance: Dental -- Has dentures  Vision -- Routine  Immunizations -- UTD Colonoscopy -- No longer needs     Past Medical History:  Diagnosis Date  . COPD (chronic obstructive pulmonary disease) ([place])   . Diabetes mellitus ([place])   . HTN (hypertension)   . Hyperlipidemia   . Tobacco abuse     Past Surgical History:  Procedure Laterality Date  . APPENDECTOMY    . BACK SURGERY    . CHOLECYSTECTOMY      Current Outpatient Medications on File Prior to Visit  Medication Sig Dispense Refill  . acetaminophen (TYLENOL) 325 MG tablet Take 650 mg by mouth every 6 (six) hours as needed (SEVERE PAIN).    [name] amLODipine (NORVASC) 10 MG tablet Take 10 mg by mouth.   1  . aspirin 81 MG tablet Take 81 mg by mouth daily.     [name] atenolol (TENORMIN) 100 MG tablet Take 100 mg by mouth daily.   1  . atorvastatin (LIPITOR) 40 MG tablet   1  . cefdinir (OMNICEF) 300 MG capsule Take 2 capsules (600 mg total) by mouth daily. 20 capsule 0  . Cholecalciferol (VITAMIN D-3 PO) Take 5,000 Units by mouth daily.    . clopidogrel (PLAVIX) 75 MG tablet Take 75 mg by mouth daily.   2  . Coenzyme Q10-Fish Oil-Vit E (CO-Q 10 OMEGA-3 FISH OIL PO) Take 400 mg by mouth daily.    . fenofibrate 160 MG tablet Take 160 mg by mouth daily.   7  . fluticasone (FLONASE) 50 MCG/ACT nasal spray Place 2 sprays into both nostrils daily. 16 g 0  . losartan (COZAAR) 100 MG tablet Take 100 mg by mouth daily.   1  . Multiple Vitamins-Minerals (WOMENS 50+ MULTI VITAMIN/MIN PO) Take 1 tablet by mouth daily. CENTRUM SILVER    . PROAIR HFA 108 (90 Base) MCG/ACT inhaler Use 2 inhalations every 4-6 hours as needed for wheezing or coughing. 1 Inhaler 3  . terazosin (HYTRIN) 10 MG capsule Take 10 mg by mouth at bedtime.   1  . traMADol (ULTRAM) 50 MG tablet Take by mouth every 6 (six) hours as needed for moderate pain.   0   No current facility-administered medications on file prior to visit.  Allergies  Allergen Reactions  . Amlodipine Besy-Benazepril Hcl Other (See Comments)    cough  . Hctz [Hydrochlorothiazide] Other (See Comments)  . Diclofenac Sodium Diarrhea  . Hydralazine     (Apresoline *ANTIHYPERTENSIVES*) HEPATITIS  . Methylprednisolone     (Medrol (Pak) *CORTICOSTEROIDS*) STATES ANGER, BUT ALOT OF ENERGY    Family History  Problem Relation Age of Onset  . Diabetes Mother   . Stroke Brother   . Coronary artery disease Neg Hx     Social History   Socioeconomic History  . Marital status: Widowed    Spouse name: Not on file  . Number of children: 2  . Years of education: Not on file  . Highest education level: Not on file  Occupational History  . Occupation: Retired  Scientific laboratory technician  . Financial resource strain: Not on file  .  Food insecurity    Worry: Not on file    Inability: Not on file  . Transportation needs    Medical: Not on file    Non-medical: Not on file  Tobacco Use  . Smoking status: Current Every Day Smoker    Packs/day: 1.00    Years: 70.00    Pack years: 70.00    Types: Cigarettes  . Smokeless tobacco: Never Used  Substance and Sexual Activity  . Alcohol use: No    Alcohol/week: 0.0 standard drinks  . Drug use: No  . Sexual activity: Not on file  Lifestyle  . Physical activity    Days per week: Not on file    Minutes per session: Not on file  . Stress: Not on file  Relationships  . Social Herbalist on phone: Not on file    Gets together: Not on file    Attends religious service: Not on file    Active member of club or organization: Not on file    Attends meetings of clubs or organizations: Not on file    Relationship status: Not on file  . Intimate partner violence    Fear of current or ex partner: Not on file    Emotionally abused: Not on file    Physically abused: Not on file    Forced sexual activity: Not on file  Other Topics Concern  . Not on file  Social History Narrative  . Not on file    Review of Systems  Constitutional: Negative.   HENT: Negative.   Eyes: Positive for blurred vision.       Blind in left eye  Respiratory: Negative.   Cardiovascular: Negative.   Gastrointestinal: Negative.   Genitourinary: Negative.   Musculoskeletal: Positive for back pain and joint pain.  Skin: Negative.   Neurological: Negative.   Psychiatric/Behavioral: Negative.   All other systems reviewed and are negative.      There were no vitals taken for this visit.  Physical Exam Vitals signs and nursing note reviewed.  Constitutional:      Appearance: Normal appearance.  Cardiovascular:     Rate and Rhythm: Normal rate and regular rhythm.     Heart sounds: Murmur present. Systolic murmur present with a grade of 3/6.  Pulmonary:     Effort: Pulmonary effort  is normal.     Breath sounds: Normal breath sounds.  Musculoskeletal: Normal range of motion.     Right lower leg: No edema.     Left lower leg: No edema.  Skin:    General: Skin is warm and dry.  Neurological:  Mental Status: She is alert.     Gait: Gait abnormal.     Comments: Walks with rolling walker   Psychiatric:        Mood and Affect: Mood normal.        Behavior: Behavior normal.        Thought Content: Thought content normal.        Judgment: Judgment normal.     Assessment/Plan: 1. Encounter for medical examination to establish care .  Pleasant 85 year old female.  We will have her follow-up after her TAVR procedure.  We will request notes from his PCP.  Will get basic labs today.  She was advised to follow-up if she needs anything in the future  2. Type 2 diabetes mellitus with other specified complication, without long-term current use of insulin (HCC) - Consider metformin  - CBC with Differential/Platelet - Basic Metabolic Panel - Hemoglobin A1c  3. Aortic valve disorder - Follow up with Cardiology as directed  4. Bilateral carotid artery stenosis - Follow up with Cardiology as directed  5. Chronic obstructive bronchitis without exacerbation ([place]) - Continue with Albuterol inhaler PRN   6. Pure hypercholesterolemia - Continue with statin and fenofibrate   7. Essential hypertension - No change in medications  - CBC with Differential/Platelet - Basic Metabolic Panel  [name], NP

## 2019-02-21 NOTE — Patient Instructions (Signed)
It was great meeting you today and I am happy to have you on the team   We will follow up with you regarding your blood work   I will review the notes that I can see from cardiology.   Your son can send me any information he has via Mychart.   Lets plan on seeing each other after your heart procedure.   Please let me know if you need anything

## 2019-03-15 ENCOUNTER — Telehealth: Payer: Self-pay | Admitting: Adult Health

## 2019-03-15 NOTE — Telephone Encounter (Signed)
Flor calling with [facility] stated that she would like verbals for PT.   Frequency: 1x7

## 2019-03-16 NOTE — Telephone Encounter (Signed)
Spoke to [place] and advised that she proceed with PT.  Nothing further needed at this time.

## 2019-05-03 ENCOUNTER — Ambulatory Visit (INDEPENDENT_AMBULATORY_CARE_PROVIDER_SITE_OTHER): Payer: Medicare Other | Admitting: Adult Health

## 2019-05-03 ENCOUNTER — Encounter: Payer: Self-pay | Admitting: Adult Health

## 2019-05-03 ENCOUNTER — Other Ambulatory Visit: Payer: Self-pay

## 2019-05-03 VITALS — BP 122/60 | HR 90 | Temp 97.6°F | Wt 134.4 lb

## 2019-05-03 DIAGNOSIS — Z952 Presence of prosthetic heart valve: Secondary | ICD-10-CM

## 2019-05-03 DIAGNOSIS — I6523 Occlusion and stenosis of bilateral carotid arteries: Secondary | ICD-10-CM

## 2019-05-03 DIAGNOSIS — I4819 Other persistent atrial fibrillation: Secondary | ICD-10-CM | POA: Diagnosis not present

## 2019-05-03 DIAGNOSIS — M150 Primary generalized (osteo)arthritis: Secondary | ICD-10-CM | POA: Diagnosis not present

## 2019-05-03 DIAGNOSIS — Z23 Encounter for immunization: Secondary | ICD-10-CM

## 2019-05-03 DIAGNOSIS — M159 Polyosteoarthritis, unspecified: Secondary | ICD-10-CM

## 2019-05-03 MED ORDER — TRAMADOL HCL 50 MG PO TABS: 50.0000 mg | ORAL_TABLET | Freq: Every evening | ORAL | 2 refills | Status: AC | PRN

## 2019-05-03 NOTE — Progress Notes (Signed)
Subjective:    Patient ID: Tara Fields, female    DOB: [birth year], 85 y.o.   MRN: [id number]  HPI  85 year old female who  has a past medical history of COPD (chronic obstructive pulmonary disease) ([place]), Diabetes mellitus ([place]), Diverticulitis, HTN (hypertension), Hyperlipidemia, Legally blind in left eye, as defined in [place], and Tobacco abuse.  She presents with her son today for follow-up visit.  She was last seen by me on 02/21/2019  Consent time she is gone through TAVR procedure on 03/07/2019 at [facility].  Had some postop bleeding on her right groin site.  She has been seen for follow-up and reports now she is doing fine.  She continues to exercise independently.  She denies shortness of breath dyspnea on exertion, orthopnea, PND, palpitations, chest pain, dizziness, or syncope  He was also diagnosed with A. fib currently on Eliquis and beta-blocker.  At her previous PCP she was treated with tramadol for osteoarthritis of the knees, hips, and lower back.  This works well for her but she needs to get a new prescription.  She recently has been seen by Dr. Noemi Chapel and is receiving steroid injections every 3 months.  She does report that this helps with mobility.   Review of Systems See HPI   Past Medical History:  Diagnosis Date  . COPD (chronic obstructive pulmonary disease) ([place])   . Diabetes mellitus ([place])   . Diverticulitis   . HTN (hypertension)   . Hyperlipidemia   . Legally blind in left eye, as defined in [place]   . Tobacco abuse     Social History   Socioeconomic History  . Marital status: Widowed    Spouse name: Not on file  . Number of children: 2  . Years of education: Not on file  . Highest education level: Not on file  Occupational History  . Occupation: Retired  Scientific laboratory technician  . Financial resource strain: Not on file  . Food insecurity    Worry: Not on file    Inability: Not on file  . Transportation needs    Medical: Not on file    Non-medical:  Not on file  Tobacco Use  . Smoking status: Current Every Day Smoker    Packs/day: 1.00    Years: 70.00    Pack years: 70.00    Types: Cigarettes  . Smokeless tobacco: Never Used  Substance and Sexual Activity  . Alcohol use: No    Alcohol/week: 0.0 standard drinks  . Drug use: No  . Sexual activity: Not on file  Lifestyle  . Physical activity    Days per week: Not on file    Minutes per session: Not on file  . Stress: Not on file  Relationships  . Social Herbalist on phone: Not on file    Gets together: Not on file    Attends religious service: Not on file    Active member of club or organization: Not on file    Attends meetings of clubs or organizations: Not on file    Relationship status: Not on file  . Intimate partner violence    Fear of current or ex partner: Not on file    Emotionally abused: Not on file    Physically abused: Not on file    Forced sexual activity: Not on file  Other Topics Concern  . Not on file  Social History Narrative   Widowed- was married for 40 years    Has  two sons        Past Surgical History:  Procedure Laterality Date  . ABDOMINAL HYSTERECTOMY    . APPENDECTOMY    . BACK SURGERY    . CHOLECYSTECTOMY      Family History  Problem Relation Age of Onset  . Diabetes Mother   . Stroke Brother   . Cerebral palsy Brother   . Coronary artery disease Neg Hx     Allergies  Allergen Reactions  . Amlodipine Besy-Benazepril Hcl Other (See Comments)    cough  . Hctz [Hydrochlorothiazide] Other (See Comments)  . Diclofenac Sodium Diarrhea  . Hydralazine     (Apresoline *ANTIHYPERTENSIVES*) HEPATITIS  . Methylprednisolone     (Medrol (Pak) *CORTICOSTEROIDS*) STATES ANGER, BUT ALOT OF ENERGY    Current Outpatient Medications on File Prior to Visit  Medication Sig Dispense Refill  . acetaminophen (TYLENOL) 325 MG tablet Take 650 mg by mouth every 6 (six) hours as needed (SEVERE PAIN).    [name] amLODipine (NORVASC) 10 MG  tablet Take 10 mg by mouth.   1  . atenolol (TENORMIN) 100 MG tablet Take 100 mg by mouth daily.   1  . atorvastatin (LIPITOR) 40 MG tablet   1  . cefdinir (OMNICEF) 300 MG capsule Take 2 capsules (600 mg total) by mouth daily. 20 capsule 0  . Cholecalciferol (VITAMIN D-3 PO) Take 5,000 Units by mouth daily.    . clopidogrel (PLAVIX) 75 MG tablet Take 75 mg by mouth daily.   2  . Coenzyme Q10-Fish Oil-Vit E (CO-Q 10 OMEGA-3 FISH OIL PO) Take 400 mg by mouth daily.    . fenofibrate 160 MG tablet Take 160 mg by mouth daily.   7  . fluticasone (FLONASE) 50 MCG/ACT nasal spray Place 2 sprays into both nostrils daily. 16 g 0  . losartan (COZAAR) 100 MG tablet Take 100 mg by mouth daily.   1  . Multiple Vitamins-Minerals (WOMENS 50+ MULTI VITAMIN/MIN PO) Take 1 tablet by mouth daily. CENTRUM SILVER    . PROAIR HFA 108 (90 Base) MCG/ACT inhaler Use 2 inhalations every 4-6 hours as needed for wheezing or coughing. 1 Inhaler 3  . terazosin (HYTRIN) 10 MG capsule Take 10 mg by mouth at bedtime.   1   No current facility-administered medications on file prior to visit.     BP 122/60 (BP Location: Left Arm, Patient Position: Sitting, Cuff Size: Normal)   Pulse 90   Temp 97.6 F (36.4 C) (Temporal)   Wt 134 lb 6.4 oz (61 kg)   SpO2 98%   BMI 26.25 kg/m       Objective:   Physical Exam Vitals signs and nursing note reviewed.  Constitutional:      Appearance: Normal appearance.  Cardiovascular:     Rate and Rhythm: Normal rate. Rhythm irregularly irregular.     Pulses: Normal pulses.     Heart sounds: Murmur present.  Pulmonary:     Effort: Pulmonary effort is normal.     Breath sounds: Normal breath sounds.  Skin:    General: Skin is warm and dry.     Capillary Refill: Capillary refill takes less than 2 seconds.  Neurological:     General: No focal deficit present.     Mental Status: She is alert and oriented to person, place, and time.  Psychiatric:        Mood and Affect: Mood  normal.        Behavior: Behavior normal.  Thought Content: Thought content normal.        Judgment: Judgment normal.       Assessment & Plan:   1. Primary osteoarthritis involving multiple joints  - traMADol (ULTRAM) 50 MG tablet; Take 1 tablet (50 mg total) by mouth at bedtime as needed.  Dispense: 30 tablet; Refill: 2 - Follow up in three months or sooner 2. S/P TAVR (transcatheter aortic valve replacement) -Appears to be doing well status post TAVR.  She was advised to follow-up with cardiology as directed  3. Other persistent atrial fibrillation -Continue with Eliquis and beta-blocker for rate control.  Follow-up with cardiology as directed  [name], NP

## 2019-07-03 ENCOUNTER — Telehealth: Payer: Self-pay | Admitting: Adult Health

## 2019-07-03 NOTE — Telephone Encounter (Signed)
Pt son called in and stated that pt was taking Ferrous Guconate 320 MG , I did not see this on her med list but he stated the previous dr had her take this.  Please advise ?

## 2019-07-04 MED ORDER — FERROUS GLUCONATE 324 (38 FE) MG PO TABS: 324.0000 mg | ORAL_TABLET | Freq: Every day | ORAL | 1 refills | Status: DC

## 2019-07-04 NOTE — Telephone Encounter (Signed)
Ok to send in or they can buy it over the counter.

## 2019-07-04 NOTE — Telephone Encounter (Signed)
Sent to the pharmacy by e-scribe. 

## 2019-08-01 ENCOUNTER — Other Ambulatory Visit: Payer: Self-pay

## 2019-08-02 ENCOUNTER — Ambulatory Visit (INDEPENDENT_AMBULATORY_CARE_PROVIDER_SITE_OTHER): Payer: Medicare Other | Admitting: Adult Health

## 2019-08-02 ENCOUNTER — Encounter: Payer: Self-pay | Admitting: Adult Health

## 2019-08-02 VITALS — BP 140/84 | Temp 97.2°F | Wt 151.0 lb

## 2019-08-02 DIAGNOSIS — I6523 Occlusion and stenosis of bilateral carotid arteries: Secondary | ICD-10-CM | POA: Diagnosis not present

## 2019-08-02 DIAGNOSIS — M8949 Other hypertrophic osteoarthropathy, multiple sites: Secondary | ICD-10-CM | POA: Diagnosis not present

## 2019-08-02 DIAGNOSIS — I10 Essential (primary) hypertension: Secondary | ICD-10-CM

## 2019-08-02 DIAGNOSIS — M159 Polyosteoarthritis, unspecified: Secondary | ICD-10-CM

## 2019-08-02 MED ORDER — TRAMADOL HCL 50 MG PO TABS: 50.0000 mg | ORAL_TABLET | Freq: Three times a day (TID) | ORAL | 2 refills | Status: DC | PRN

## 2019-08-02 MED ORDER — AMLODIPINE BESYLATE 5 MG PO TABS: 5.0000 mg | ORAL_TABLET | Freq: Every day | ORAL | 3 refills | Status: DC

## 2019-08-02 NOTE — Progress Notes (Signed)
Subjective:    Patient ID: Tara Fields, female    DOB: 23-Jan-1928, 85 y.o.   MRN: [id number]  HPI 85 year old female who  has a past medical history of COPD (chronic obstructive pulmonary disease) ([place]), Diabetes mellitus ([place]), Diverticulitis, HTN (hypertension), Hyperlipidemia, Legally blind in left eye, as defined in [place], and Tobacco abuse.  She presents to the office today with her son for 14-month follow-up regarding osteoarthritic pain.  She was placed back on tramadol for osteoarthritis of the knees, hips, low back.  This worked well for her in the past but when she moved to [place] from [facility] she needed a new prescription.  Today she reports that the tramadol is helping, she takes it in the morning but by the time she gets to early evening the pain is started to come back.  She reports that this medication does not make her feel dizzy, lightheaded or sedated.  She also needs a refill of Norvasc.    Review of Systems See HPI   Past Medical History:  Diagnosis Date  . COPD (chronic obstructive pulmonary disease) ([place])   . Diabetes mellitus ([place])   . Diverticulitis   . HTN (hypertension)   . Hyperlipidemia   . Legally blind in left eye, as defined in [place]   . Tobacco abuse     Social History   Socioeconomic History  . Marital status: Widowed    Spouse name: Not on file  . Number of children: 2  . Years of education: Not on file  . Highest education level: Not on file  Occupational History  . Occupation: Retired  Tobacco Use  . Smoking status: Current Every Day Smoker    Packs/day: 1.00    Years: 70.00    Pack years: 70.00    Types: Cigarettes  . Smokeless tobacco: Never Used  Substance and Sexual Activity  . Alcohol use: No    Alcohol/week: 0.0 standard drinks  . Drug use: No  . Sexual activity: Not on file  Other Topics Concern  . Not on file  Social History Narrative   Widowed- was married for 50 years    Has two sons        Social Determinants of Radio broadcast assistant Strain:   . Difficulty of Paying Living Expenses: Not on file  Food Insecurity:   . Worried About Charity fundraiser in the Last Year: Not on file  . Ran Out of Food in the Last Year: Not on file  Transportation Needs:   . Lack of Transportation (Medical): Not on file  . Lack of Transportation (Non-Medical): Not on file  Physical Activity:   . Days of Exercise per Week: Not on file  . Minutes of Exercise per Session: Not on file  Stress:   . Feeling of Stress : Not on file  Social Connections:   . Frequency of Communication with Friends and Family: Not on file  . Frequency of Social Gatherings with Friends and Family: Not on file  . Attends Religious Services: Not on file  . Active Member of Clubs or Organizations: Not on file  . Attends Archivist Meetings: Not on file  . Marital Status: Not on file  Intimate Partner Violence:   . Fear of Current or Ex-Partner: Not on file  . Emotionally Abused: Not on file  . Physically Abused: Not on file  . Sexually Abused: Not on file    Past Surgical History:  Procedure Laterality Date  . ABDOMINAL HYSTERECTOMY    . APPENDECTOMY    . BACK SURGERY    . CHOLECYSTECTOMY      Family History  Problem Relation Age of Onset  . Diabetes Mother   . Stroke Brother   . Cerebral palsy Brother   . Coronary artery disease Neg Hx     Allergies  Allergen Reactions  . Amlodipine Besy-Benazepril Hcl Other (See Comments)    cough  . Hctz [Hydrochlorothiazide] Other (See Comments)  . Diclofenac Sodium Diarrhea  . Hydralazine     (Apresoline *ANTIHYPERTENSIVES*) HEPATITIS  . Methylprednisolone     (Medrol (Pak) *CORTICOSTEROIDS*) STATES ANGER, BUT ALOT OF ENERGY    Current Outpatient Medications on File Prior to Visit  Medication Sig Dispense Refill  . acetaminophen (TYLENOL) 325 MG tablet Take 650 mg by mouth every 6 (six) hours as needed (SEVERE PAIN).    [name] amLODipine  (NORVASC) 5 MG tablet 5 mg daily.    [name] aspirin 81 MG chewable tablet Chew 81 mg by mouth.    [name] atorvastatin (LIPITOR) 40 MG tablet   1  . cefdinir (OMNICEF) 300 MG capsule Take 2 capsules (600 mg total) by mouth daily. 20 capsule 0  . Cholecalciferol (VITAMIN D-3 PO) Take 5,000 Units by mouth daily.    . Coenzyme Q10-Fish Oil-Vit E (CO-Q 10 OMEGA-3 FISH OIL PO) Take 400 mg by mouth daily.    . fenofibrate 160 MG tablet Take 160 mg by mouth daily.   7  . ferrous gluconate (FERGON) 324 MG tablet Take 1 tablet (324 mg total) by mouth daily with breakfast. 90 tablet 1  . fluticasone (FLONASE) 50 MCG/ACT nasal spray Place 2 sprays into both nostrils daily. 16 g 0  . losartan (COZAAR) 100 MG tablet Take 100 mg by mouth daily.   1  . metoprolol tartrate (LOPRESSOR) 50 MG tablet Take 50 mg by mouth 2 (two) times daily.    . Multiple Vitamins-Minerals (WOMENS 50+ MULTI VITAMIN/MIN PO) Take 1 tablet by mouth daily. CENTRUM SILVER    . PROAIR HFA 108 (90 Base) MCG/ACT inhaler Use 2 inhalations every 4-6 hours as needed for wheezing or coughing. 1 Inhaler 3  . terazosin (HYTRIN) 10 MG capsule Take 10 mg by mouth at bedtime.   1  . apixaban (ELIQUIS) 5 MG TABS tablet Take 5 mg by mouth 2 (two) times daily.     No current facility-administered medications on file prior to visit.    BP 140/84   Temp (!) 97.2 F (36.2 C) (Temporal)   Wt 151 lb (68.5 kg)   BMI 29.49 kg/m       Objective:   Physical Exam Vitals and nursing note reviewed.  Constitutional:      Appearance: Normal appearance.  Musculoskeletal:        General: Tenderness present. No swelling.  Skin:    General: Skin is warm and dry.     Capillary Refill: Capillary refill takes less than 2 seconds.  Neurological:     General: No focal deficit present.     Mental Status: She is alert and oriented to person, place, and time.     Gait: Gait abnormal.     Comments: In wheelchair    Psychiatric:        Mood and Affect: Mood normal.         Behavior: Behavior normal.        Thought Content: Thought content normal.  Judgment: Judgment normal.       Assessment & Plan:  1. Primary osteoarthritis involving multiple joints - Will increase Tramadol to every 8 hours PRN with a limit of 60 a month  - traMADol (ULTRAM) 50 MG tablet; Take 1 tablet (50 mg total) by mouth every 8 (eight) hours as needed.  Dispense: 60 tablet; Refill: 2  2. Essential hypertension  - amLODipine (NORVASC) 5 MG tablet; Take 1 tablet (5 mg total) by mouth daily.  Dispense: 90 tablet; Refill: 3   [name], NP

## 2019-08-19 ENCOUNTER — Encounter: Payer: Self-pay | Admitting: Cardiovascular Disease

## 2019-08-19 NOTE — Progress Notes (Signed)
Chief Complaint  Patient presents with  . New Patient (Initial Visit)    aortic valve disease   History of Present Illness: 86 yo female with history of severe AS s/p TAVR at [facility], COPD, CAD, GERD, persistent atrial fibrillation, DM, HTN, HLD, tobacco abuse and diverticulitis here today to establish cardiology care. I saw her remotely in 2016. She underwent TAVR at [facility] 03/07/19 with placement of a 26 mm Sapien Ultra THV from the right femoral artery approach. She developed a pseudoaneurysm post TAVR and had thrombin injection. She has atrial fibrillation and is on Eliquis. She has carotid artery disease. She tells me that no stent was placed in her carotid artery. Cardiac cath March 2019 at [place] with 25% mid LAD stenosis, 50% stenosis second diagonal branch, 50% distal Circumflex stenosis, 75% ostial stenosis of a small Diagonal branch. Echo September 2020 at [place] with LVEF=45-50%. Moderate MR. Normally   She tells me today that she has no chest pain, dyspnea, palpitations, lower extremity edema, orthopnea, PND, dizziness, near syncope or syncope. She feels great. Her right groin is much better.   Primary Care Physician: [name], NP  Past Medical History:  Diagnosis Date  . Aortic stenosis    s/p TAVR 26 mm Edwards Sapien 3 THV July 2020  . CAD (coronary artery disease)   . COPD (chronic obstructive pulmonary disease) ([place])   . Diabetes mellitus ([place])   . Diverticulitis   . GERD (gastroesophageal reflux disease)   . HTN (hypertension)   . Hyperlipidemia   . Legally blind in left eye, as defined in [place]   . Tobacco abuse     Past Surgical History:  Procedure Laterality Date  . ABDOMINAL HYSTERECTOMY    . APPENDECTOMY    . BACK SURGERY    . CATARACT EXTRACTION, BILATERAL    . CHOLECYSTECTOMY      Current Outpatient Medications  Medication Sig Dispense Refill  . acetaminophen (TYLENOL) 325 MG tablet Take 650 mg by mouth every 6 (six) hours as needed (SEVERE PAIN).     [name] amLODipine (NORVASC) 5 MG tablet Take 1 tablet (5 mg total) by mouth daily. 90 tablet 3  . apixaban (ELIQUIS) 5 MG TABS tablet Take 5 mg by mouth 2 (two) times daily.    [name] aspirin 81 MG chewable tablet Chew 81 mg by mouth.    [name] atorvastatin (LIPITOR) 40 MG tablet   1  . Cholecalciferol (VITAMIN D-3 PO) Take 5,000 Units by mouth daily.    . Coenzyme Q10-Fish Oil-Vit E (CO-Q 10 OMEGA-3 FISH OIL PO) Take 400 mg by mouth daily.    . fenofibrate 160 MG tablet Take 160 mg by mouth daily.   7  . ferrous gluconate (FERGON) 324 MG tablet Take 1 tablet (324 mg total) by mouth daily with breakfast. 90 tablet 1  . losartan (COZAAR) 100 MG tablet Take 100 mg by mouth daily.   1  . metoprolol tartrate (LOPRESSOR) 50 MG tablet Take 50 mg by mouth 2 (two) times daily.    . Multiple Vitamins-Minerals (WOMENS 50+ MULTI VITAMIN/MIN PO) Take 1 tablet by mouth daily. CENTRUM SILVER    . PROAIR HFA 108 (90 Base) MCG/ACT inhaler Use 2 inhalations every 4-6 hours as needed for wheezing or coughing. 1 Inhaler 3  . terazosin (HYTRIN) 10 MG capsule Take 10 mg by mouth at bedtime.   1  . traMADol (ULTRAM) 50 MG tablet Take 1 tablet (50 mg total) by mouth every 8 (eight)  hours as needed. 60 tablet 2   No current facility-administered medications for this visit.    Allergies  Allergen Reactions  . Amlodipine Besy-Benazepril Hcl Other (See Comments)    cough  . Hctz [Hydrochlorothiazide] Other (See Comments)  . Diclofenac Sodium Diarrhea  . Hydralazine     (Apresoline *ANTIHYPERTENSIVES*) HEPATITIS  . Methylprednisolone     (Medrol (Pak) *CORTICOSTEROIDS*) STATES ANGER, BUT ALOT OF ENERGY    Social History   Socioeconomic History  . Marital status: Widowed    Spouse name: Not on file  . Number of children: 2  . Years of education: Not on file  . Highest education level: Not on file  Occupational History  . Occupation: Retired  Tobacco Use  . Smoking status: Current Every Day Smoker    Packs/day: 1.00     Years: 70.00    Pack years: 70.00    Types: Cigarettes  . Smokeless tobacco: Never Used  Substance and Sexual Activity  . Alcohol use: No    Alcohol/week: 0.0 standard drinks  . Drug use: No  . Sexual activity: Not on file  Other Topics Concern  . Not on file  Social History Narrative   Widowed- was married for 13 years    Has two sons       Social Determinants of Radio broadcast assistant Strain:   . Difficulty of Paying Living Expenses: Not on file  Food Insecurity:   . Worried About Charity fundraiser in the Last Year: Not on file  . Ran Out of Food in the Last Year: Not on file  Transportation Needs:   . Lack of Transportation (Medical): Not on file  . Lack of Transportation (Non-Medical): Not on file  Physical Activity:   . Days of Exercise per Week: Not on file  . Minutes of Exercise per Session: Not on file  Stress:   . Feeling of Stress : Not on file  Social Connections:   . Frequency of Communication with Friends and Family: Not on file  . Frequency of Social Gatherings with Friends and Family: Not on file  . Attends Religious Services: Not on file  . Active Member of Clubs or Organizations: Not on file  . Attends Archivist Meetings: Not on file  . Marital Status: Not on file  Intimate Partner Violence:   . Fear of Current or Ex-Partner: Not on file  . Emotionally Abused: Not on file  . Physically Abused: Not on file  . Sexually Abused: Not on file    Family History  Problem Relation Age of Onset  . Diabetes Mother   . Stroke Brother   . Cerebral palsy Brother   . Coronary artery disease Neg Hx     Review of Systems:  As stated in the HPI and otherwise negative.   BP 124/82   Pulse 89   Ht 5' (1.524 m)   Wt 154 lb 12.8 oz (70.2 kg)   SpO2 98%   BMI 30.23 kg/m   Physical Examination: General: Well developed, well nourished, NAD  HEENT: OP clear, mucus membranes moist  SKIN: warm, dry. No rashes. Neuro: No focal deficits   Musculoskeletal: Muscle strength 5/5 all ext  Psychiatric: Mood and affect normal  Neck: No JVD, no carotid bruits, no thyromegaly, no lymphadenopathy.  Lungs:Clear bilaterally, no wheezes, rhonci, crackles Cardiovascular: Regular rate and rhythm. Systolic murmur.  Abdomen:Soft. Bowel sounds present. Non-tender.  Extremities: No lower extremity edema. Pulses are 2 + in  the bilateral DP/PT.  EKG:  EKG is ordered today. The ekg ordered today demonstrates Atrial fib, rate 89 bpm  Recent Labs: 02/21/2019: BUN 18; Creatinine, Ser 0.67; Hemoglobin 12.7; Platelets 320.0; Potassium 3.8; Sodium 145   Lipid Panel No results found for: CHOL, TRIG, HDL, CHOLHDL, VLDL, LDLCALC, LDLDIRECT   Wt Readings from Last 3 Encounters:  08/20/19 154 lb 12.8 oz (70.2 kg)  08/02/19 151 lb (68.5 kg)  05/03/19 134 lb 6.4 oz (61 kg)     Other studies Reviewed: Additional studies/ records that were reviewed today include:  Review of the above records demonstrates:    Assessment and Plan:   1. Severe aortic stenosis s/p TAVR: Continue ASA, Eliquis and SBE prophylaxis. She has planned f/u in the structural heart clinic at [place] in July 2021 and will have a repeat echo at that time.   2. CAD: No chest pain. Mild to moderate CAD by cath in 2019. Continue ASA, statin and beta blocker  3. Paroxysmal atrial fibrillation: Atrial fib today. Rate controlled. Continue Eliquis and metoprolol  4. HTN: BP controlled. No changes  5. Carotid artery disease: ? Followed at [facility]. Notes indicate right carotid artery stenting  6. Tobacco abuse: Smoking cessation is recommended.   Current medicines are reviewed at length with the patient today.  The patient does not have concerns regarding medicines.  The following changes have been made:  no change  Labs/ tests ordered today include:   Orders Placed This Encounter  Procedures  . EKG 12-Lead     Disposition:   FU with me in 12 months   Signed, Lauree Chandler, MD 08/20/2019 9:15 AM    [facility] [address], [place], [place]  [address] Phone: [phone]; Fax: [phone]

## 2019-08-20 ENCOUNTER — Other Ambulatory Visit: Payer: Self-pay

## 2019-08-20 ENCOUNTER — Ambulatory Visit (INDEPENDENT_AMBULATORY_CARE_PROVIDER_SITE_OTHER): Payer: Medicare Other | Admitting: Cardiovascular Disease

## 2019-08-20 ENCOUNTER — Encounter: Payer: Self-pay | Admitting: Cardiovascular Disease

## 2019-08-20 VITALS — BP 124/82 | HR 89 | Ht 60.0 in | Wt 154.8 lb

## 2019-08-20 DIAGNOSIS — I4821 Permanent atrial fibrillation: Secondary | ICD-10-CM | POA: Diagnosis not present

## 2019-08-20 DIAGNOSIS — I6523 Occlusion and stenosis of bilateral carotid arteries: Secondary | ICD-10-CM

## 2019-08-20 DIAGNOSIS — I10 Essential (primary) hypertension: Secondary | ICD-10-CM

## 2019-08-20 DIAGNOSIS — I350 Nonrheumatic aortic (valve) stenosis: Secondary | ICD-10-CM | POA: Diagnosis not present

## 2019-08-20 NOTE — Patient Instructions (Signed)
Medication Instructions:  No changes *If you need a refill on your cardiac medications before your next appointment, please call your pharmacy*  Lab Work: none If you have labs (blood work) drawn today and your tests are completely normal, you will receive your results only by: . MyChart Message (if you have MyChart) OR . A paper copy in the mail If you have any lab test that is abnormal or we need to change your treatment, we will call you to review the results.  Testing/Procedures: none  Follow-Up: At [facility], you and your health needs are our priority.  As part of our continuing mission to provide you with exceptional heart care, we have created designated Provider Care Teams.  These Care Teams include your primary Cardiologist (physician) and Advanced Practice Providers (APPs -  Physician Assistants and Nurse Practitioners) who all work together to provide you with the care you need, when you need it.  Your next appointment:   12 month(s)  The format for your next appointment:   Either In Person or Virtual  Provider:   Christopher McAlhany, MD  Other Instructions   

## 2019-08-29 ENCOUNTER — Ambulatory Visit: Payer: Medicare Other | Attending: Internal Medicine

## 2019-08-29 DIAGNOSIS — Z23 Encounter for immunization: Secondary | ICD-10-CM | POA: Insufficient documentation

## 2019-08-29 NOTE — Progress Notes (Signed)
   Covid-19 Vaccination Clinic  Name:  Tara Fields    MRN: [id number] DOB: 03-27-1928  08/29/2019  [name] was observed post Covid-19 immunization for 30 minutes based on pre-vaccination screening without incidence. She was provided with Vaccine Information Sheet and instruction to access the V-Safe system.   Ms. Cousin was instructed to call 911 with any severe reactions post vaccine: [name] Difficulty breathing  . Swelling of your face and throat  . A fast heartbeat  . A bad rash all over your body  . Dizziness and weakness    Immunizations Administered    Name Date Dose VIS Date Route   Pfizer COVID-19 Vaccine 08/29/2019  9:05 AM 0.3 mL 07/27/2019 Intramuscular   Manufacturer: [employer], [employer]   Lot: [id number]   [place]: [address]-2

## 2019-09-18 ENCOUNTER — Ambulatory Visit: Payer: Medicare Other | Attending: Internal Medicine

## 2019-09-18 DIAGNOSIS — Z23 Encounter for immunization: Secondary | ICD-10-CM

## 2019-09-18 NOTE — Progress Notes (Signed)
   Covid-19 Vaccination Clinic  Name:  Tara Fields    MRN: [id number] DOB: [birth year]  09/18/2019  [name] was observed post Covid-19 immunization for 15 minutes without incidence. She was provided with Vaccine Information Sheet and instruction to access the V-Safe system.   [name] was instructed to call 911 with any severe reactions post vaccine: [name] Difficulty breathing  . Swelling of your face and throat  . A fast heartbeat  . A bad rash all over your body  . Dizziness and weakness    Immunizations Administered    Name Date Dose VIS Date Route   Pfizer COVID-19 Vaccine 09/18/2019  9:22 AM 0.3 mL 07/27/2019 Intramuscular   Manufacturer: [place]   Lot: [id number]   [place]: [address]-0

## 2019-10-31 ENCOUNTER — Telehealth: Payer: Self-pay | Admitting: Adult Health

## 2019-10-31 MED ORDER — ATORVASTATIN CALCIUM 40 MG PO TABS: 40.0000 mg | ORAL_TABLET | Freq: Every day | ORAL | 0 refills | Status: DC

## 2019-10-31 MED ORDER — TERAZOSIN HCL 10 MG PO CAPS: 10.0000 mg | ORAL_CAPSULE | Freq: Every day | ORAL | 0 refills | Status: DC

## 2019-10-31 MED ORDER — APIXABAN 5 MG PO TABS: 5.0000 mg | ORAL_TABLET | Freq: Two times a day (BID) | ORAL | 0 refills | Status: DC

## 2019-10-31 MED ORDER — FERROUS GLUCONATE 324 (38 FE) MG PO TABS: 324.0000 mg | ORAL_TABLET | Freq: Every day | ORAL | 0 refills | Status: DC

## 2019-10-31 MED ORDER — METOPROLOL TARTRATE 50 MG PO TABS: 50.0000 mg | ORAL_TABLET | Freq: Two times a day (BID) | ORAL | 0 refills | Status: DC

## 2019-10-31 NOTE — Telephone Encounter (Signed)
pt requesting a refill on medication  atorvastatin (LIPITOR) 40 MG tablet terazosin (HYTRIN) 10 MG capsule apixaban (ELIQUIS) 5 MG TABS tablet(Expired) metoprolol tartrate (LOPRESSOR) 50 MG tablet ferrous gluconate (FERGON) 324 MG tablet  [id number] - [place], [place] - [address] AT [address]  Phone:  [phone] Fax:  [phone]  May call  Louie Casa son -[phone] if any questions

## 2019-11-07 ENCOUNTER — Other Ambulatory Visit: Payer: Self-pay | Admitting: Adult Health

## 2019-11-07 DIAGNOSIS — M159 Polyosteoarthritis, unspecified: Secondary | ICD-10-CM

## 2019-11-07 NOTE — Telephone Encounter (Signed)
Pt is out of Tramadol and would like this filled today if possible. She has enough for today but will be out by tomorrow.   Pharmacy: [facility]: [phone]

## 2019-11-20 ENCOUNTER — Telehealth: Payer: Self-pay | Admitting: Adult Health

## 2019-11-20 NOTE — Telephone Encounter (Signed)
Pt's son, Louie Casa, would like to know if pt could take any kind of dieretic medication. Pt has swelling around ankles. Thanks

## 2019-11-20 NOTE — Telephone Encounter (Signed)
Left a message for Tara Fields to call back.  Pt needs to be scheduled for appointment.

## 2019-11-21 ENCOUNTER — Telehealth: Payer: Self-pay | Admitting: Adult Health

## 2019-11-21 NOTE — Telephone Encounter (Signed)
Appt scheduled

## 2019-11-21 NOTE — Telephone Encounter (Signed)
Called and spoke to the pharmacy.  Last filled by Bennie Dallas on 07/30/2019.

## 2019-11-21 NOTE — Telephone Encounter (Signed)
Pt scheduled for 11/23/2019 @ 2 PM.  Nothing further needed.

## 2019-11-21 NOTE — Telephone Encounter (Signed)
Medication:Fenofibrate 160 MG Pharmacy: [facility]

## 2019-11-21 NOTE — Telephone Encounter (Signed)
Can we find out when he last refill was

## 2019-11-21 NOTE — Telephone Encounter (Signed)
Not sure the pt is taking this medication.

## 2019-11-22 MED ORDER — FENOFIBRATE 160 MG PO TABS: 160.0000 mg | ORAL_TABLET | Freq: Every day | ORAL | 1 refills | Status: DC

## 2019-11-22 NOTE — Telephone Encounter (Signed)
SENT TO THE PHARMACY BY E-SCRIBE. 

## 2019-11-22 NOTE — Telephone Encounter (Signed)
Ok to fill 90 +1

## 2019-11-23 ENCOUNTER — Encounter: Payer: Self-pay | Admitting: Adult Health

## 2019-11-23 ENCOUNTER — Other Ambulatory Visit: Payer: Self-pay

## 2019-11-23 ENCOUNTER — Ambulatory Visit (INDEPENDENT_AMBULATORY_CARE_PROVIDER_SITE_OTHER): Payer: Medicare Other | Admitting: Adult Health

## 2019-11-23 ENCOUNTER — Ambulatory Visit (INDEPENDENT_AMBULATORY_CARE_PROVIDER_SITE_OTHER): Payer: Medicare Other

## 2019-11-23 VITALS — BP 158/76 | Temp 97.6°F

## 2019-11-23 DIAGNOSIS — R600 Localized edema: Secondary | ICD-10-CM

## 2019-11-23 DIAGNOSIS — R350 Frequency of micturition: Secondary | ICD-10-CM | POA: Diagnosis not present

## 2019-11-23 DIAGNOSIS — I6523 Occlusion and stenosis of bilateral carotid arteries: Secondary | ICD-10-CM | POA: Diagnosis not present

## 2019-11-23 LAB — URINALYSIS, ROUTINE W REFLEX MICROSCOPIC
Bilirubin Urine: NEGATIVE
Nitrite: NEGATIVE
Specific Gravity, Urine: 1.025 (ref 1.000–1.030)
Total Protein, Urine: NEGATIVE
Urine Glucose: NEGATIVE
Urobilinogen, UA: 0.2 (ref 0.0–1.0)
pH: 5.5 (ref 5.0–8.0)

## 2019-11-23 LAB — BASIC METABOLIC PANEL
BUN: 25 mg/dL — ABNORMAL HIGH (ref 6–23)
CO2: 28 mEq/L (ref 19–32)
Calcium: 9.4 mg/dL (ref 8.4–10.5)
Chloride: 104 mEq/L (ref 96–112)
Creatinine, Ser: 0.77 mg/dL (ref 0.40–1.20)
GFR: 70.46 mL/min (ref >60.00)
Glucose, Bld: 138 mg/dL — ABNORMAL HIGH (ref 70–99)
Potassium: 3.3 mEq/L — ABNORMAL LOW (ref 3.5–5.1)
Sodium: 142 mEq/L (ref 135–145)

## 2019-11-23 LAB — BRAIN NATRIURETIC PEPTIDE: Pro B Natriuretic peptide (BNP): 519 pg/mL — ABNORMAL HIGH (ref 0.0–100.0)

## 2019-11-23 NOTE — Progress Notes (Signed)
Subjective:    Patient ID: Tara Fields, female    DOB: 07-02-1928, 86 y.o.   MRN: [id number]  HPI  86 year old female who  has a past medical history of Aortic stenosis, CAD (coronary artery disease), COPD (chronic obstructive pulmonary disease) ([place]), Diabetes mellitus ([place]), Diverticulitis, GERD (gastroesophageal reflux disease), HTN (hypertension), Hyperlipidemia, Legally blind in left eye, as defined in [place], and Tobacco abuse.   She presents to the office today with her son for bilateral lower extremity swelling x 2-3 months.  She has not changed her diet and her family has actually been having her eat a more low-sodium diet.  Tries to elevate her legs at night and does drink water throughout the day.  She does have a significant cardiac history including aortic stenosis status post TAVR in July 2020 and CAD.  He denies chest pain but does report mild shortness of breath.  She has not experienced any calf pain redness, or warmth  She is also complaining of urinary frequency but denies other UTI-like symptoms.  This has been an ongoing issue for an extended period of time.    Review of Systems See HPI   Past Medical History:  Diagnosis Date  . Aortic stenosis    s/p TAVR 26 mm Edwards Sapien 3 THV July 2020  . CAD (coronary artery disease)   . COPD (chronic obstructive pulmonary disease) ([place])   . Diabetes mellitus ([place])   . Diverticulitis   . GERD (gastroesophageal reflux disease)   . HTN (hypertension)   . Hyperlipidemia   . Legally blind in left eye, as defined in [place]   . Tobacco abuse     Social History   Socioeconomic History  . Marital status: Widowed    Spouse name: Not on file  . Number of children: 2  . Years of education: Not on file  . Highest education level: Not on file  Occupational History  . Occupation: Retired  Tobacco Use  . Smoking status: Current Every Day Smoker    Packs/day: 1.00    Years: 70.00    Pack years: 70.00    Types: Cigarettes    . Smokeless tobacco: Never Used  Substance and Sexual Activity  . Alcohol use: No    Alcohol/week: 0.0 standard drinks  . Drug use: No  . Sexual activity: Not on file  Other Topics Concern  . Not on file  Social History Narrative   Widowed- was married for 40 years    Has two sons       Social Determinants of Radio broadcast assistant Strain:   . Difficulty of Paying Living Expenses:   Food Insecurity:   . Worried About Charity fundraiser in the Last Year:   . Arboriculturist in the Last Year:   Transportation Needs:   . Film/video editor (Medical):   [name] Lack of Transportation (Non-Medical):   Physical Activity:   . Days of Exercise per Week:   . Minutes of Exercise per Session:   Stress:   . Feeling of Stress :   Social Connections:   . Frequency of Communication with Friends and Family:   . Frequency of Social Gatherings with Friends and Family:   . Attends Religious Services:   . Active Member of Clubs or Organizations:   . Attends Archivist Meetings:   [name] Marital Status:   Intimate Partner Violence:   . Fear of Current or Ex-Partner:   .  Emotionally Abused:   [name] Physically Abused:   . Sexually Abused:     Past Surgical History:  Procedure Laterality Date  . ABDOMINAL HYSTERECTOMY    . APPENDECTOMY    . BACK SURGERY    . CATARACT EXTRACTION, BILATERAL    . CHOLECYSTECTOMY      Family History  Problem Relation Age of Onset  . Diabetes Mother   . Stroke Brother   . Cerebral palsy Brother   . Coronary artery disease Neg Hx     Allergies  Allergen Reactions  . Amlodipine Besy-Benazepril Hcl Other (See Comments)    cough  . Hctz [Hydrochlorothiazide] Other (See Comments)  . Diclofenac Sodium Diarrhea  . Hydralazine     (Apresoline *ANTIHYPERTENSIVES*) HEPATITIS  . Methylprednisolone     (Medrol (Pak) *CORTICOSTEROIDS*) STATES ANGER, BUT ALOT OF ENERGY    Current Outpatient Medications on File Prior to Visit  Medication Sig  Dispense Refill  . acetaminophen (TYLENOL) 325 MG tablet Take 650 mg by mouth every 6 (six) hours as needed (SEVERE PAIN).    [name] amLODipine (NORVASC) 5 MG tablet Take 1 tablet (5 mg total) by mouth daily. 90 tablet 3  . apixaban (ELIQUIS) 5 MG TABS tablet Take 1 tablet (5 mg total) by mouth 2 (two) times daily. 180 tablet 0  . aspirin 81 MG chewable tablet Chew 81 mg by mouth.    [name] atorvastatin (LIPITOR) 40 MG tablet Take 1 tablet (40 mg total) by mouth daily at 6 PM. 90 tablet 0  . Cholecalciferol (VITAMIN D-3 PO) Take 5,000 Units by mouth daily.    . Coenzyme Q10-Fish Oil-Vit E (CO-Q 10 OMEGA-3 FISH OIL PO) Take 400 mg by mouth daily.    . fenofibrate 160 MG tablet Take 1 tablet (160 mg total) by mouth daily. 90 tablet 1  . ferrous gluconate (FERGON) 324 MG tablet Take 1 tablet (324 mg total) by mouth daily with breakfast. 90 tablet 0  . losartan (COZAAR) 100 MG tablet Take 100 mg by mouth daily.   1  . metoprolol tartrate (LOPRESSOR) 50 MG tablet Take 1 tablet (50 mg total) by mouth 2 (two) times daily. 180 tablet 0  . Multiple Vitamins-Minerals (WOMENS 50+ MULTI VITAMIN/MIN PO) Take 1 tablet by mouth daily. CENTRUM SILVER    . PROAIR HFA 108 (90 Base) MCG/ACT inhaler Use 2 inhalations every 4-6 hours as needed for wheezing or coughing. 1 Inhaler 3  . terazosin (HYTRIN) 10 MG capsule Take 1 capsule (10 mg total) by mouth at bedtime. 90 capsule 0  . traMADol (ULTRAM) 50 MG tablet TAKE 1 TABLET (50 MG TOTAL) BY MOUTH EVERY 8 (EIGHT) HOURS AS NEEDED. 60 tablet 2   No current facility-administered medications on file prior to visit.    BP (!) 158/76   Temp 97.6 F (36.4 C)       Objective:   Physical Exam Vitals and nursing note reviewed.  Constitutional:      Appearance: Normal appearance.  Cardiovascular:     Rate and Rhythm: Normal rate and regular rhythm.     Pulses: Normal pulses.     Heart sounds: Normal heart sounds.  Pulmonary:     Effort: Pulmonary effort is normal.      Breath sounds: Normal breath sounds.  Abdominal:     General: Abdomen is flat.     Palpations: Abdomen is soft.  Musculoskeletal:     Right lower leg: 1+ Pitting Edema present.     Left lower leg:  1+ Pitting Edema present.  Skin:    General: Skin is warm.     Capillary Refill: Capillary refill takes less than 2 seconds.  Neurological:     General: No focal deficit present.     Mental Status: She is alert and oriented to person, place, and time.  Psychiatric:        Mood and Affect: Mood normal.        Behavior: Behavior normal.        Thought Content: Thought content normal.        Judgment: Judgment normal.        Assessment & Plan:  1. Lower extremity edema -Concern for CHF versus venous insufficiency.  No concern for DVT.  Will check BMP, BNP, and chest x-ray.  Consider low-dose Lasix. - Basic Metabolic Panel - Brain Natriuretic Peptide - DG Chest 2 View; Future - DG Chest 2 View  2. Urinary frequency - OAB vs UTI - Urinalysis - Urinalysis, Routine w reflex microscopic   [name], NP

## 2019-11-23 NOTE — Patient Instructions (Addendum)
It was great seeing you today   I am going to do some blood work and get a chest xray on you.   I will follow up with you regarding your blood work and chest xray

## 2019-11-26 ENCOUNTER — Telehealth: Payer: Self-pay | Admitting: Adult Health

## 2019-11-26 ENCOUNTER — Encounter: Payer: Self-pay | Admitting: Adult Health

## 2019-11-26 NOTE — Telephone Encounter (Signed)
Pt's son, Louie Casa (on [place]), called to get the results from the lab test and to inquire what to do next?  Louie Casa can be reached at [phone]

## 2019-11-27 ENCOUNTER — Other Ambulatory Visit: Payer: Self-pay | Admitting: Adult Health

## 2019-11-27 DIAGNOSIS — J189 Pneumonia, unspecified organism: Secondary | ICD-10-CM

## 2019-11-27 DIAGNOSIS — R600 Localized edema: Secondary | ICD-10-CM

## 2019-11-27 MED ORDER — POTASSIUM CHLORIDE CRYS ER 10 MEQ PO TBCR: 10.0000 meq | EXTENDED_RELEASE_TABLET | Freq: Two times a day (BID) | ORAL | 0 refills | Status: DC

## 2019-11-27 MED ORDER — FUROSEMIDE 20 MG PO TABS: 10.0000 mg | ORAL_TABLET | Freq: Every day | ORAL | 0 refills | Status: DC

## 2019-11-27 MED ORDER — DOXYCYCLINE HYCLATE 100 MG PO CAPS: 100.0000 mg | ORAL_CAPSULE | Freq: Two times a day (BID) | ORAL | 0 refills | Status: AC

## 2019-11-27 NOTE — Telephone Encounter (Signed)
Spoke to Doran and informed him of the below message.  I have scheduled her for a follow up in 2 weeks.  Nothing further needed.

## 2019-11-27 NOTE — Telephone Encounter (Signed)
I sent this to Leidi's Mychart this morning in response to the question through mychart  Good Morning,    Her BNP was slightly elevated, this is likely due to mild heart failure, which is not uncommon at her age.  This is what is also causing her lower extremity swelling.  I have sent in a diuretic called Lasix, since she mentioned that she responds very well to Lasix, I am starting her off on 10 mg daily.  I also sent in a potassium supplement for her to take with the Lasix so that she does not become deficient in her potassium levels.     Her chest x-ray showed a possible mild pneumonia in the left upper lung.  I have sent in an antibiotic called doxycycline to help clear this up.   I would like to see Asti back in about 2 weeks to see how she is doing   Please let me know if you have any questions

## 2019-12-10 ENCOUNTER — Other Ambulatory Visit: Payer: Self-pay

## 2019-12-11 ENCOUNTER — Ambulatory Visit (INDEPENDENT_AMBULATORY_CARE_PROVIDER_SITE_OTHER)
Admission: RE | Admit: 2019-12-11 | Discharge: 2019-12-11 | Disposition: A | Discharge status: Home / Self Care | Payer: Medicare Other | Source: Ambulatory Visit | Attending: Adult Health | Admitting: Adult Health

## 2019-12-11 ENCOUNTER — Encounter: Payer: Self-pay | Admitting: Adult Health

## 2019-12-11 ENCOUNTER — Ambulatory Visit (INDEPENDENT_AMBULATORY_CARE_PROVIDER_SITE_OTHER): Payer: Medicare Other | Admitting: Adult Health

## 2019-12-11 VITALS — BP 132/90 | HR 71 | Temp 97.6°F

## 2019-12-11 DIAGNOSIS — R600 Localized edema: Secondary | ICD-10-CM

## 2019-12-11 DIAGNOSIS — J189 Pneumonia, unspecified organism: Secondary | ICD-10-CM | POA: Diagnosis not present

## 2019-12-11 DIAGNOSIS — R350 Frequency of micturition: Secondary | ICD-10-CM | POA: Diagnosis not present

## 2019-12-11 DIAGNOSIS — I6523 Occlusion and stenosis of bilateral carotid arteries: Secondary | ICD-10-CM | POA: Diagnosis not present

## 2019-12-11 LAB — URINALYSIS, ROUTINE W REFLEX MICROSCOPIC
Bilirubin Urine: NEGATIVE
Hgb urine dipstick: NEGATIVE
Ketones, ur: NEGATIVE
Nitrite: NEGATIVE
Specific Gravity, Urine: 1.025 (ref 1.000–1.030)
Total Protein, Urine: NEGATIVE
Urine Glucose: NEGATIVE
Urobilinogen, UA: 0.2 (ref 0.0–1.0)
pH: 6 (ref 5.0–8.0)

## 2019-12-11 NOTE — Patient Instructions (Signed)
It was great seeing you today   I am going to check your urine again and make sure that you do not have a urinary tract infection  Please go to the [facility] office at your convenience for a follow up chest xrya   Continue to elevated legs

## 2019-12-11 NOTE — Progress Notes (Signed)
Subjective:    Patient ID: Tara Fields, female    DOB: 1927/11/11, 86 y.o.   MRN: [id number]  HPI 86 year old female who  has a past medical history of Aortic stenosis, CAD (coronary artery disease), COPD (chronic obstructive pulmonary disease) ([place]), Diabetes mellitus ([place]), Diverticulitis, GERD (gastroesophageal reflux disease), HTN (hypertension), Hyperlipidemia, Legally blind in left eye, as defined in [place], and Tobacco abuse.   She was seen 2 weeks ago for bilateral lower extremity swelling x2 to 3 months.  She had not changed her diet and her family had actually been having her eat more low-sodium.  She was elevating her legs at night, but despite these modalities she continued to have lower extremity edema.  She denied chest pain but did report mild shortness of breath.  Additionally, she reported urinary frequency that has been present for over a year as well as some new onset of dysuria.  UA was positive for leukocytes and bacteria; urine culture was unable to be added on.    Her BNP was slightly elevated at 519  On chest x-ray she had mild patchy/nodular posterior left upper lobe opacity.  Restarted her on Lasix 10 mg by mouth daily as well as doxycycline for likely pneumonia.  Today she reports that she has not been taking the Lasix, she is just been elevating her legs higher and this seems to has resolved the fluid.  She is breathing better and does not have as much shortness of breath.  She no longer is having dysuria after taking doxycycline but continues with frequency and urgency   Review of Systems See HPI   Past Medical History:  Diagnosis Date  . Aortic stenosis    s/p TAVR 26 mm Edwards Sapien 3 THV July 2020  . CAD (coronary artery disease)   . COPD (chronic obstructive pulmonary disease) ([place])   . Diabetes mellitus ([place])   . Diverticulitis   . GERD (gastroesophageal reflux disease)   . HTN (hypertension)   . Hyperlipidemia   . Legally blind in left eye,  as defined in [place]   . Tobacco abuse     Social History   Socioeconomic History  . Marital status: Widowed    Spouse name: Not on file  . Number of children: 2  . Years of education: Not on file  . Highest education level: Not on file  Occupational History  . Occupation: Retired  Tobacco Use  . Smoking status: Current Every Day Smoker    Packs/day: 1.00    Years: 70.00    Pack years: 70.00    Types: Cigarettes  . Smokeless tobacco: Never Used  Substance and Sexual Activity  . Alcohol use: No    Alcohol/week: 0.0 standard drinks  . Drug use: No  . Sexual activity: Not on file  Other Topics Concern  . Not on file  Social History Narrative   Widowed- was married for 40 years    Has two sons       Social Determinants of Radio broadcast assistant Strain:   . Difficulty of Paying Living Expenses:   Food Insecurity:   . Worried About Charity fundraiser in the Last Year:   . Arboriculturist in the Last Year:   Transportation Needs:   . Film/video editor (Medical):   [name] Lack of Transportation (Non-Medical):   Physical Activity:   . Days of Exercise per Week:   . Minutes of Exercise per Session:  Stress:   . Feeling of Stress :   Social Connections:   . Frequency of Communication with Friends and Family:   . Frequency of Social Gatherings with Friends and Family:   . Attends Religious Services:   . Active Member of Clubs or Organizations:   . Attends Archivist Meetings:   [name] Marital Status:   Intimate Partner Violence:   . Fear of Current or Ex-Partner:   . Emotionally Abused:   [name] Physically Abused:   . Sexually Abused:     Past Surgical History:  Procedure Laterality Date  . ABDOMINAL HYSTERECTOMY    . APPENDECTOMY    . BACK SURGERY    . CATARACT EXTRACTION, BILATERAL    . CHOLECYSTECTOMY      Family History  Problem Relation Age of Onset  . Diabetes Mother   . Stroke Brother   . Cerebral palsy Brother   . Coronary artery disease Neg  Hx     Allergies  Allergen Reactions  . Amlodipine Besy-Benazepril Hcl Other (See Comments)    cough  . Hctz [Hydrochlorothiazide] Other (See Comments)  . Diclofenac Sodium Diarrhea  . Hydralazine     (Apresoline *ANTIHYPERTENSIVES*) HEPATITIS  . Methylprednisolone     (Medrol (Pak) *CORTICOSTEROIDS*) STATES ANGER, BUT ALOT OF ENERGY    Current Outpatient Medications on File Prior to Visit  Medication Sig Dispense Refill  . acetaminophen (TYLENOL) 325 MG tablet Take 650 mg by mouth every 6 (six) hours as needed (SEVERE PAIN).    [name] apixaban (ELIQUIS) 5 MG TABS tablet Take 1 tablet (5 mg total) by mouth 2 (two) times daily. 180 tablet 0  . aspirin 81 MG chewable tablet Chew 81 mg by mouth.    [name] atorvastatin (LIPITOR) 40 MG tablet Take 1 tablet (40 mg total) by mouth daily at 6 PM. 90 tablet 0  . Cholecalciferol (VITAMIN D-3 PO) Take 5,000 Units by mouth daily.    . Coenzyme Q10-Fish Oil-Vit E (CO-Q 10 OMEGA-3 FISH OIL PO) Take 400 mg by mouth daily.    . fenofibrate 160 MG tablet Take 1 tablet (160 mg total) by mouth daily. 90 tablet 1  . ferrous gluconate (FERGON) 324 MG tablet Take 1 tablet (324 mg total) by mouth daily with breakfast. 90 tablet 0  . losartan (COZAAR) 100 MG tablet Take 100 mg by mouth daily.   1  . metoprolol tartrate (LOPRESSOR) 50 MG tablet Take 1 tablet (50 mg total) by mouth 2 (two) times daily. 180 tablet 0  . Multiple Vitamins-Minerals (WOMENS 50+ MULTI VITAMIN/MIN PO) Take 1 tablet by mouth daily. CENTRUM SILVER    . potassium chloride (KLOR-CON M10) 10 MEQ tablet Take 1 tablet (10 mEq total) by mouth 2 (two) times daily. 60 tablet 0  . PROAIR HFA 108 (90 Base) MCG/ACT inhaler Use 2 inhalations every 4-6 hours as needed for wheezing or coughing. 1 Inhaler 3  . terazosin (HYTRIN) 10 MG capsule Take 1 capsule (10 mg total) by mouth at bedtime. 90 capsule 0  . traMADol (ULTRAM) 50 MG tablet TAKE 1 TABLET (50 MG TOTAL) BY MOUTH EVERY 8 (EIGHT) HOURS AS NEEDED. 60  tablet 2  . amLODipine (NORVASC) 5 MG tablet Take 1 tablet (5 mg total) by mouth daily. 90 tablet 3   No current facility-administered medications on file prior to visit.    BP 132/90   Pulse 71   Temp 97.6 F (36.4 C)   SpO2 97%  Objective:   Physical Exam Vitals and nursing note reviewed.  Constitutional:      Appearance: Normal appearance.  Cardiovascular:     Rate and Rhythm: Normal rate and regular rhythm.     Pulses: Normal pulses.     Heart sounds: Normal heart sounds.  Pulmonary:     Effort: Pulmonary effort is normal.     Breath sounds: Normal breath sounds.  Musculoskeletal:     Right lower leg: No edema.     Left lower leg: No edema.  Skin:    General: Skin is warm and dry.  Neurological:     General: No focal deficit present.     Mental Status: She is alert and oriented to person, place, and time.  Psychiatric:        Mood and Affect: Mood normal.        Behavior: Behavior normal.        Thought Content: Thought content normal.        Judgment: Judgment normal.       Assessment & Plan:  1. Urinary frequency -We will recheck with culture since this could not be added on 2 weeks ago.  Consider OAB.  We will discuss medications of urinalysis and culture come back negative - Urinalysis - Culture, Urine  2. Pneumonia of left upper lobe due to infectious organism  - DG Chest 2 View; Future  3. Lower extremity edema -Has resolved with elevation.  No need for Lasix at this time.  Continue to monitor at home and follow-up as needed  [name], NP

## 2019-12-12 ENCOUNTER — Telehealth: Payer: Self-pay | Admitting: Adult Health

## 2019-12-12 DIAGNOSIS — R918 Other nonspecific abnormal finding of lung field: Secondary | ICD-10-CM

## 2019-12-12 NOTE — Telephone Encounter (Signed)
Spoke to patient's son and DPR Louie Casa and informed him of x-ray results which showed  Persistent left upper lobe density most compatible with a lung nodule. Malignancy is possible. Recommend CT chest with contrast for further evaluation.  He is okay with doing a CT of the chest

## 2019-12-13 ENCOUNTER — Other Ambulatory Visit: Payer: Self-pay | Admitting: Adult Health

## 2019-12-13 LAB — URINE CULTURE
MICRO NUMBER:: 10410834
SPECIMEN QUALITY:: ADEQUATE

## 2019-12-13 MED ORDER — CIPROFLOXACIN HCL 500 MG PO TABS
500.0000 mg | ORAL_TABLET | Freq: Two times a day (BID) | ORAL | 0 refills | Status: DC
Start: 2019-12-13 — End: 2019-12-28

## 2019-12-19 ENCOUNTER — Other Ambulatory Visit: Payer: Self-pay | Admitting: Adult Health

## 2019-12-19 DIAGNOSIS — R600 Localized edema: Secondary | ICD-10-CM

## 2019-12-20 NOTE — Telephone Encounter (Signed)
Does not need this medication since she is not taking lasix

## 2019-12-24 ENCOUNTER — Other Ambulatory Visit: Payer: Self-pay | Admitting: Adult Health

## 2019-12-24 DIAGNOSIS — R600 Localized edema: Secondary | ICD-10-CM

## 2019-12-25 NOTE — Telephone Encounter (Signed)
Denied.  Pt is not taking the diuretic so this medication is not needed.

## 2019-12-25 NOTE — Telephone Encounter (Signed)
Patient's son called to get a Rx refill   potassium chloride (KLOR-CON M10) 10 MEQ tablet   [id number] - [place], [place] - [place] Phone:  [phone]  Fax:  [phone]

## 2019-12-27 ENCOUNTER — Ambulatory Visit
Admission: RE | Admit: 2019-12-27 | Discharge: 2019-12-27 | Disposition: A | Discharge status: Home / Self Care | Payer: Medicare Other | Source: Ambulatory Visit | Attending: Adult Health | Admitting: Adult Health

## 2019-12-27 ENCOUNTER — Other Ambulatory Visit: Payer: Self-pay

## 2019-12-27 ENCOUNTER — Other Ambulatory Visit: Payer: Self-pay | Admitting: Adult Health

## 2019-12-27 DIAGNOSIS — R918 Other nonspecific abnormal finding of lung field: Secondary | ICD-10-CM

## 2019-12-27 MED ORDER — IOPAMIDOL (ISOVUE-300) INJECTION 61%
75.0000 mL | Freq: Once | INTRAVENOUS | Status: AC | PRN
  Administered 2019-12-27: 75 mL via INTRAVENOUS

## 2019-12-28 ENCOUNTER — Telehealth: Payer: Self-pay | Admitting: Adult Health

## 2019-12-28 DIAGNOSIS — R918 Other nonspecific abnormal finding of lung field: Secondary | ICD-10-CM

## 2019-12-28 MED ORDER — CIPROFLOXACIN HCL 500 MG PO TABS: 500.0000 mg | ORAL_TABLET | Freq: Two times a day (BID) | ORAL | 0 refills | Status: DC

## 2019-12-28 NOTE — Telephone Encounter (Signed)
I spoke to the patient's son and informed him of Tara Fields's CT results which showed  IMPRESSION: 1. 2.4 x 1.8 cm spiculated nodule in the superior segment of the left lower lobe, suspicious for a primary lung carcinoma. Recommend further evaluation with a PET-CT and/or tissue sampling. 2. 4 mm indeterminate right lower lobe nodule. 3. 2.5 x 1.8 cm left adrenal mass. This could represent an adenoma or metastasis. 4. Enlarged central pulmonary arteries, compatible with pulmonary arterial hypertension. 5. COPD with centrilobular emphysema. 6.  Calcific coronary artery and aortic atherosclerosis.   He will relay this information to his mother and he is okay with me referring to pulmonary for biopsy

## 2020-01-03 ENCOUNTER — Encounter: Payer: Self-pay | Admitting: Emergency Medicine

## 2020-01-03 ENCOUNTER — Ambulatory Visit (INDEPENDENT_AMBULATORY_CARE_PROVIDER_SITE_OTHER): Payer: Medicare Other | Admitting: Emergency Medicine

## 2020-01-03 ENCOUNTER — Other Ambulatory Visit: Payer: Self-pay

## 2020-01-03 DIAGNOSIS — I6523 Occlusion and stenosis of bilateral carotid arteries: Secondary | ICD-10-CM | POA: Diagnosis not present

## 2020-01-03 DIAGNOSIS — R911 Solitary pulmonary nodule: Secondary | ICD-10-CM | POA: Diagnosis not present

## 2020-01-03 NOTE — Progress Notes (Signed)
Subjective:    Patient ID: Tara Fields, female    DOB: Jan 25, 1928, 86 y.o.   MRN: [id number]  HPI 86 year old woman with a history of former tobacco use (70 pack years), associated COPD (no PFTs, no BD), A Fib, CAD, aortic stenosis for which she underwent TAVR July 2020 and is on Eliquis, hypertension, diabetes.   She is referred today for abnormal CT scan of the chest.  She originally had a chest x-ray 11/24/2019 that showed a hazy left upper lobe density, still present on repeat chest x-ray 4/27 so a CT chest was done on 12/28/2019.  I have reviewed all the films.  The CT shows an isolated spiculated superior segmental left lower lobe nodule 2.4 x 1.8 cm. Also noted was a 4 mm right lower lobe nodule, emphysematous change and a left adrenal mass 2.5 x 1.8 cm.  Her son is here and assists with the history She denies any cough, dyspnea, does have severe arthritis.    Review of Systems As per [facility]   Past Medical History:  Diagnosis Date  . Aortic stenosis    s/p TAVR 26 mm Edwards Sapien 3 THV July 2020  . CAD (coronary artery disease)   . COPD (chronic obstructive pulmonary disease) ([place])   . Diabetes mellitus ([place])   . Diverticulitis   . GERD (gastroesophageal reflux disease)   . HTN (hypertension)   . Hyperlipidemia   . Legally blind in left eye, as defined in [place]   . Tobacco abuse      Family History  Problem Relation Age of Onset  . Diabetes Mother   . Stroke Brother   . Cerebral palsy Brother   . Coronary artery disease Neg Hx      Social History   Socioeconomic History  . Marital status: Widowed    Spouse name: Not on file  . Number of children: 2  . Years of education: Not on file  . Highest education level: Not on file  Occupational History  . Occupation: Retired  Tobacco Use  . Smoking status: Former Smoker    Packs/day: 1.00    Years: 70.00    Pack years: 70.00    Types: Cigarettes    Quit date: 12/15/2019    Years since quitting: 0.0  .  Smokeless tobacco: Never Used  . Tobacco comment: Pt states quiting a year ago. 01/03/20 [facility]  Substance and Sexual Activity  . Alcohol use: No    Alcohol/week: 0.0 standard drinks  . Drug use: No  . Sexual activity: Not on file  Other Topics Concern  . Not on file  Social History Narrative   Widowed- was married for 40 years    Has two sons       Social Determinants of Radio broadcast assistant Strain:   . Difficulty of Paying Living Expenses:   Food Insecurity:   . Worried About Charity fundraiser in the Last Year:   . Arboriculturist in the Last Year:   Transportation Needs:   . Film/video editor (Medical):   [name] Lack of Transportation (Non-Medical):   Physical Activity:   . Days of Exercise per Week:   . Minutes of Exercise per Session:   Stress:   . Feeling of Stress :   Social Connections:   . Frequency of Communication with Friends and Family:   . Frequency of Social Gatherings with Friends and Family:   . Attends Religious Services:   . Active  Member of Clubs or Organizations:   . Attends Archivist Meetings:   [name] Marital Status:   Intimate Partner Violence:   . Fear of Current or Ex-Partner:   . Emotionally Abused:   [name] Physically Abused:   . Sexually Abused:      Allergies  Allergen Reactions  . Amlodipine Besy-Benazepril Hcl Other (See Comments)    cough  . Hctz [Hydrochlorothiazide] Other (See Comments)  . Diclofenac Sodium Diarrhea  . Hydralazine     (Apresoline *ANTIHYPERTENSIVES*) HEPATITIS  . Methylprednisolone     (Medrol (Pak) *CORTICOSTEROIDS*) STATES ANGER, BUT ALOT OF ENERGY     Outpatient Medications Prior to Visit  Medication Sig Dispense Refill  . acetaminophen (TYLENOL) 325 MG tablet Take 650 mg by mouth every 6 (six) hours as needed (SEVERE PAIN).    [name] apixaban (ELIQUIS) 5 MG TABS tablet Take 1 tablet (5 mg total) by mouth 2 (two) times daily. 180 tablet 0  . aspirin 81 MG chewable tablet Chew 81 mg by mouth.    [name]  atorvastatin (LIPITOR) 40 MG tablet Take 1 tablet (40 mg total) by mouth daily at 6 PM. 90 tablet 0  . Cholecalciferol (VITAMIN D-3 PO) Take 5,000 Units by mouth daily.    . Coenzyme Q10-Fish Oil-Vit E (CO-Q 10 OMEGA-3 FISH OIL PO) Take 400 mg by mouth daily.    . fenofibrate 160 MG tablet Take 1 tablet (160 mg total) by mouth daily. 90 tablet 1  . ferrous gluconate (FERGON) 324 MG tablet TAKE 1 TABLET BY MOUTH DAILY WITH BREAKFAST 90 tablet 1  . losartan (COZAAR) 100 MG tablet Take 100 mg by mouth daily.   1  . metoprolol tartrate (LOPRESSOR) 50 MG tablet Take 1 tablet (50 mg total) by mouth 2 (two) times daily. 180 tablet 0  . Multiple Vitamins-Minerals (WOMENS 50+ MULTI VITAMIN/MIN PO) Take 1 tablet by mouth daily. CENTRUM SILVER    . potassium chloride (KLOR-CON M10) 10 MEQ tablet Take 1 tablet (10 mEq total) by mouth 2 (two) times daily. 60 tablet 0  . PROAIR HFA 108 (90 Base) MCG/ACT inhaler Use 2 inhalations every 4-6 hours as needed for wheezing or coughing. 1 Inhaler 3  . terazosin (HYTRIN) 10 MG capsule Take 1 capsule (10 mg total) by mouth at bedtime. 90 capsule 0  . traMADol (ULTRAM) 50 MG tablet TAKE 1 TABLET (50 MG TOTAL) BY MOUTH EVERY 8 (EIGHT) HOURS AS NEEDED. 60 tablet 2  . amLODipine (NORVASC) 5 MG tablet Take 1 tablet (5 mg total) by mouth daily. 90 tablet 3  . ciprofloxacin (CIPRO) 500 MG tablet Take 1 tablet (500 mg total) by mouth 2 (two) times daily. 6 tablet 0   No facility-administered medications prior to visit.        Objective:   Physical Exam  Vitals:   01/03/20 1535  BP: 124/72  Pulse: 83  Temp: 98.3 F (36.8 C)  TempSrc: Temporal  SpO2: 94%  Weight: 158 lb 12.8 oz (72 kg)  Height: 5' (1.524 m)   Gen: Pleasant, well-nourished, in no distress,  In wheelchair  ENT: No lesions,  mouth clear,  oropharynx clear, no postnasal drip  Neck: No JVD, no stridor  Lungs: distant, no wheeze or crackles.   Cardiovascular: irreg irreg, 2/6 S  murmur  Musculoskeletal: No deformities, no cyanosis or clubbing  Neuro: alert, awake, a bit tangential and distracted, but can re-focus  Skin: Warm, no lesions or rash      Assessment &  Plan:  Pulmonary nodule 1 cm or greater in diameter I reviewed the imaging with the patient and her son today.  Explained that this is most likely a primary lung cancer based on appearance and given her smoking history, risk factors.  Also explained that there is a range of possible approaches to take based on her philosophy for her care.  If we were to pursue biopsy then I believe a PET scan will help [place] plan this.  If her adrenal lesion is hypermetabolic then this may be the safest biopsy target.  Alternatively TTNA of the pulmonary nodule or bronchoscopy with navigation are possibilities.  The ENB would of course necessitate general anesthesia with associated risk.  The patient is willing to get the PET scan, will discuss the options with her son.  We will then get back together reviewed the PET scan and decide whether to pursue biopsy versus following serial scans,  and which kind of biopsy to pursue.  Baltazar Apo, MD, PhD 01/03/2020, 5:24 PM [facility] [phone] or if no answer [phone]

## 2020-01-03 NOTE — Assessment & Plan Note (Signed)
I reviewed the imaging with the patient and her son today.  Explained that this is most likely a primary lung cancer based on appearance and given her smoking history, risk factors.  Also explained that there is a range of possible approaches to take based on her philosophy for her care.  If we were to pursue biopsy then I believe a PET scan will help [place] plan this.  If her adrenal lesion is hypermetabolic then this may be the safest biopsy target.  Alternatively TTNA of the pulmonary nodule or bronchoscopy with navigation are possibilities.  The ENB would of course necessitate general anesthesia with associated risk.  The patient is willing to get the PET scan, will discuss the options with her son.  We will then get back together reviewed the PET scan and decide whether to pursue biopsy versus following serial scans,  and which kind of biopsy to pursue.

## 2020-01-03 NOTE — Patient Instructions (Addendum)
We will perform a PET scan to better evaluate your pulmonary nodule and adrenal nodule.  Follow with Dr Lamonte Sakai next available after the PET to review together.

## 2020-01-16 ENCOUNTER — Other Ambulatory Visit: Payer: Self-pay

## 2020-01-16 ENCOUNTER — Ambulatory Visit (HOSPITAL_COMMUNITY)
Admission: RE | Admit: 2020-01-16 | Discharge: 2020-01-16 | Disposition: A | Discharge status: Home / Self Care | Payer: Medicare Other | Source: Ambulatory Visit | Attending: Emergency Medicine | Admitting: Emergency Medicine

## 2020-01-16 DIAGNOSIS — Z952 Presence of prosthetic heart valve: Secondary | ICD-10-CM | POA: Insufficient documentation

## 2020-01-16 DIAGNOSIS — I2510 Atherosclerotic heart disease of native coronary artery without angina pectoris: Secondary | ICD-10-CM | POA: Insufficient documentation

## 2020-01-16 DIAGNOSIS — R911 Solitary pulmonary nodule: Secondary | ICD-10-CM

## 2020-01-16 DIAGNOSIS — I700 Atherosclerosis of aorta: Secondary | ICD-10-CM | POA: Diagnosis not present

## 2020-01-16 DIAGNOSIS — D3502 Benign neoplasm of left adrenal gland: Secondary | ICD-10-CM | POA: Diagnosis not present

## 2020-01-16 DIAGNOSIS — R918 Other nonspecific abnormal finding of lung field: Secondary | ICD-10-CM | POA: Insufficient documentation

## 2020-01-16 LAB — GLUCOSE, CAPILLARY: Glucose-Capillary: 141 mg/dL — ABNORMAL HIGH (ref 70–99)

## 2020-01-16 MED ORDER — FLUDEOXYGLUCOSE F - 18 (FDG) INJECTION: 7.8000 | Freq: Once | INTRAVENOUS | Status: DC | PRN

## 2020-01-18 ENCOUNTER — Other Ambulatory Visit: Payer: Self-pay | Admitting: Adult Health

## 2020-01-18 DIAGNOSIS — R600 Localized edema: Secondary | ICD-10-CM

## 2020-01-18 DIAGNOSIS — R911 Solitary pulmonary nodule: Secondary | ICD-10-CM

## 2020-01-19 NOTE — Telephone Encounter (Signed)
Need to call pt on 6/7 to discuss PET results and plan biopsy

## 2020-01-21 NOTE — Telephone Encounter (Signed)
I spoke with the patient's son about the PET scan results.  The best target for biopsy is the left lung nodule.  Even so TTNA does carry risk of pneumothorax.  He understandably asks whether she might be a candidate for referral to XRT even without a tissue diagnosis.  This is possible.  I did explain that without tissue we would not be able to consider targeted therapy, etc.  I offered to speak with radiation oncology about whether she would be a good candidate.  I will discuss her either directly with radiation oncology or at the thoracic oncology conference on Thursday

## 2020-01-22 NOTE — Telephone Encounter (Signed)
DENIED.  PT IS NO LONGER TAKING THIS MEDICATION. 

## 2020-01-24 NOTE — Telephone Encounter (Signed)
Please let the patient and her son know that I reviewed her case at thoracic conference this morning. It was recommended that we refer her to Radiation Oncology to discuss possible radiation treatment to her nodule without a biopsy. I will refer her to see Dr Sondra Come with Rad Onc.   Please go ahead and make the referral as long as they agree.

## 2020-01-27 ENCOUNTER — Other Ambulatory Visit: Payer: Self-pay | Admitting: Adult Health

## 2020-01-29 MED ORDER — METOPROLOL TARTRATE 50 MG PO TABS: 50.0000 mg | ORAL_TABLET | Freq: Two times a day (BID) | ORAL | 1 refills | Status: DC

## 2020-01-30 ENCOUNTER — Other Ambulatory Visit: Payer: Self-pay

## 2020-01-31 ENCOUNTER — Encounter: Payer: Self-pay | Admitting: Adult Health

## 2020-01-31 ENCOUNTER — Ambulatory Visit (INDEPENDENT_AMBULATORY_CARE_PROVIDER_SITE_OTHER): Payer: Medicare Other | Admitting: Adult Health

## 2020-01-31 VITALS — BP 160/90 | HR 101 | Temp 97.3°F

## 2020-01-31 DIAGNOSIS — I6523 Occlusion and stenosis of bilateral carotid arteries: Secondary | ICD-10-CM | POA: Diagnosis not present

## 2020-01-31 DIAGNOSIS — Z952 Presence of prosthetic heart valve: Secondary | ICD-10-CM | POA: Diagnosis not present

## 2020-01-31 DIAGNOSIS — I10 Essential (primary) hypertension: Secondary | ICD-10-CM

## 2020-01-31 DIAGNOSIS — M8949 Other hypertrophic osteoarthropathy, multiple sites: Secondary | ICD-10-CM

## 2020-01-31 DIAGNOSIS — E1169 Type 2 diabetes mellitus with other specified complication: Secondary | ICD-10-CM | POA: Diagnosis not present

## 2020-01-31 DIAGNOSIS — C3432 Malignant neoplasm of lower lobe, left bronchus or lung: Secondary | ICD-10-CM

## 2020-01-31 DIAGNOSIS — M159 Polyosteoarthritis, unspecified: Secondary | ICD-10-CM

## 2020-01-31 LAB — CBC WITH DIFFERENTIAL/PLATELET
Basophils Absolute: 0.1 10*3/uL (ref 0.0–0.1)
Basophils Relative: 0.9 % (ref 0.0–3.0)
Eosinophils Absolute: 0.1 10*3/uL (ref 0.0–0.7)
Eosinophils Relative: 2.1 % (ref 0.0–5.0)
HCT: 39.3 % (ref 36.0–46.0)
Hemoglobin: 13.3 g/dL (ref 12.0–15.0)
Lymphocytes Relative: 20.8 % (ref 12.0–46.0)
Lymphs Abs: 1.2 10*3/uL (ref 0.7–4.0)
MCHC: 33.8 g/dL (ref 30.0–36.0)
MCV: 92.2 fl (ref 78.0–100.0)
Monocytes Absolute: 0.6 10*3/uL (ref 0.1–1.0)
Monocytes Relative: 9.3 % (ref 3.0–12.0)
Neutro Abs: 4 10*3/uL (ref 1.4–7.7)
Neutrophils Relative %: 66.9 % (ref 43.0–77.0)
Platelets: 229 10*3/uL (ref 150.0–400.0)
RBC: 4.26 Mil/uL (ref 3.87–5.11)
RDW: 13.8 % (ref 11.5–15.5)
WBC: 5.9 10*3/uL (ref 4.0–10.5)

## 2020-01-31 LAB — BASIC METABOLIC PANEL
BUN: 23 mg/dL (ref 6–23)
CO2: 29 mEq/L (ref 19–32)
Calcium: 9.9 mg/dL (ref 8.4–10.5)
Chloride: 101 mEq/L (ref 96–112)
Creatinine, Ser: 0.92 mg/dL (ref 0.40–1.20)
GFR: 57.35 mL/min — ABNORMAL LOW (ref >60.00)
Glucose, Bld: 119 mg/dL — ABNORMAL HIGH (ref 70–99)
Potassium: 4.4 mEq/L (ref 3.5–5.1)
Sodium: 139 mEq/L (ref 135–145)

## 2020-01-31 LAB — HEMOGLOBIN A1C: Hgb A1c MFr Bld: 6.4 % (ref 4.6–6.5)

## 2020-01-31 NOTE — Progress Notes (Signed)
Subjective:    Patient ID: Tara Fields, female    DOB: [birth year], 86 y.o.   MRN: [id number]  HPI 86 year old female who  has a past medical history of Aortic stenosis, CAD (coronary artery disease), COPD (chronic obstructive pulmonary disease) ([place]), Diabetes mellitus ([place]), Diverticulitis, GERD (gastroesophageal reflux disease), HTN (hypertension), Hyperlipidemia, Legally blind in left eye, as defined in [place], and Tobacco abuse.  She presents to the office today for 65-monthfollow-up.  She is with her son today.  Since I last saw her in April 2021 she has been diagnosed with primary lung cancer of the left lower lung.  She underwent PET scan on 01/16/2020 which confirmed this.  She has met with pulmonary who has referred her to radiation oncology for possible radiation treatment.  She does not like to talk about this and she does not know yet what her plan is.  She has a history of severe aortic stenosis S/p TAVR and continues with aspirin and Eliquis.  She also is a history of CAD and is on a statin, fenofibrate and beta-blocker.  Other cardiac history includes PAF which is rate controlled with beta-blocker and is on Eliquis for stroke prevention.  Her diabetes is diet controlled with her last A1c being 5.7.  Her biggest complaint today is that of bilateral knee pain and low back pain, both are chronic in nature.  Recently she has had injections into her knees but she does not feel as though this helped at all and actually believes that it made her pain worse.  She is having a hard time walking and does have to use a wheelchair.  She denies redness, warmth, or swelling.  She is taking tramadol as needed reports that this helps but does not relieve the pain   Review of Systems See HPI   Past Medical History:  Diagnosis Date  . Aortic stenosis    s/p TAVR 26 mm Edwards Sapien 3 THV July 2020  . CAD (coronary artery disease)   . COPD (chronic obstructive pulmonary disease) ([place]   .  Diabetes mellitus ([place]   . Diverticulitis   . GERD (gastroesophageal reflux disease)   . HTN (hypertension)   . Hyperlipidemia   . Legally blind in left [place], [place] defined [place]  . Tobacco abuse     Social History   Socioeconomic History  . Marital status: Widowed    Spouse name: Not on file  . Number of children: 2  . Years of education: Not on file  . Highest education level: Not on file  Occupational History  . Occupation: Retired  Tobacco Use  . Smoking status: Former Smoker    Packs/day: 1.00    Years: 70.00    Pack years: 70.00    Types: Cigarettes    Quit date: 12/15/2019    Years since quitting: 0.1  . Smokeless tobacco: Never Used  . Tobacco comment: Pt states quiting a year ago. 01/03/20 ARJ  Substance and Sexual Activity  . Alcohol use: No    Alcohol/week: 0.0 standard drinks  . Drug use: No  . Sexual activity: Not on file  Other Topics Concern  . Not on file  Social History Narrative   Widowed- was married for 40 years    Has two sons       Social Determinants of HRadio broadcast assistantStrain:   . Difficulty of Paying Living Expenses:   Food Insecurity:   . Worried About Running  Out of Food in the Last Year:   . [place] in the Last Year:   Transportation Needs:   . Lack of Transportation (Medical):   [name] Lack of Transportation (Non-Medical):   Physical Activity:   . Days of Exercise per Week:   . Minutes of Exercise per Session:   Stress:   . Feeling of Stress :   Social Connections:   . Frequency of Communication with Friends and Family:   . Frequency of Social Gatherings with Friends and Family:   . Attends Religious Services:   . Active Member of Clubs or Organizations:   . Attends Archivist Meetings:   [name] Marital Status:   Intimate Partner Violence:   . Fear of Current or Ex-Partner:   . Emotionally Abused:   [name] Physically Abused:   . Sexually Abused:     Past Surgical History:  Procedure Laterality Date  .  ABDOMINAL HYSTERECTOMY    . APPENDECTOMY    . BACK SURGERY    . CATARACT EXTRACTION, BILATERAL    . CHOLECYSTECTOMY      Family History  Problem Relation Age of Onset  . Diabetes Mother   . Stroke Brother   . Cerebral palsy Brother   . Coronary artery disease Neg Hx     Allergies  Allergen Reactions  . Amlodipine Besy-Benazepril Hcl Other (See Comments)    cough  . Hctz [Hydrochlorothiazide] Other (See Comments)  . Diclofenac Sodium Diarrhea  . Hydralazine     (Apresoline *ANTIHYPERTENSIVES*) HEPATITIS  . Methylprednisolone     (Medrol (Pak) *CORTICOSTEROIDS*) STATES ANGER, BUT ALOT OF ENERGY    Current Outpatient Medications on File Prior to Visit  Medication Sig Dispense Refill  . acetaminophen (TYLENOL) 325 MG tablet Take 650 mg by mouth every 6 (six) hours as needed (SEVERE PAIN).    [name] aspirin 81 MG chewable tablet Chew 81 mg by mouth.    [name] atorvastatin (LIPITOR) 40 MG tablet TAKE 1 TABLET (40 MG TOTAL) BY MOUTH DAILY AT 6 PM. 90 tablet 1  . Cholecalciferol (VITAMIN D-3 PO) Take 5,000 Units by mouth daily.    . Coenzyme Q10-Fish Oil-Vit E (CO-Q 10 OMEGA-3 FISH OIL PO) Take 400 mg by mouth daily.    [name] ELIQUIS 5 MG TABS tablet TAKE 1 TABLET BY MOUTH TWICE A DAY 180 tablet 1  . fenofibrate 160 MG tablet Take 1 tablet (160 mg total) by mouth daily. 90 tablet 1  . ferrous gluconate (FERGON) 324 MG tablet TAKE 1 TABLET BY MOUTH DAILY WITH BREAKFAST 90 tablet 1  . losartan (COZAAR) 100 MG tablet Take 100 mg by mouth daily.   1  . metoprolol tartrate (LOPRESSOR) 50 MG tablet Take 1 tablet (50 mg total) by mouth 2 (two) times daily. 180 tablet 1  . Multiple Vitamins-Minerals (WOMENS 50+ MULTI VITAMIN/MIN PO) Take 1 tablet by mouth daily. CENTRUM SILVER    . potassium chloride (KLOR-CON M10) 10 MEQ tablet Take 1 tablet (10 mEq total) by mouth 2 (two) times daily. 60 tablet 0  . PROAIR HFA 108 (90 Base) MCG/ACT inhaler Use 2 inhalations every 4-6 hours as needed for wheezing or  coughing. 1 Inhaler 3  . terazosin (HYTRIN) 10 MG capsule TAKE 1 CAPSULE (10 MG TOTAL) BY MOUTH AT BEDTIME. 90 capsule 1  . traMADol (ULTRAM) 50 MG tablet TAKE 1 TABLET (50 MG TOTAL) BY MOUTH EVERY 8 (EIGHT) HOURS AS NEEDED. 60 tablet 2  . amLODipine (NORVASC) 5 MG  tablet Take 1 tablet (5 mg total) by mouth daily. 90 tablet 3   No current facility-administered medications on file prior to visit.    BP (!) 160/90   Pulse (!) 101   Temp (!) 97.3 F (36.3 C)   SpO2 97%       Objective:   Physical Exam Vitals and nursing note reviewed.  Constitutional:      Appearance: Normal appearance.  Cardiovascular:     Rate and Rhythm: Normal rate and regular rhythm.     Pulses: Normal pulses.     Heart sounds: Normal heart sounds.  Pulmonary:     Effort: Pulmonary effort is normal.     Breath sounds: Normal breath sounds.  Musculoskeletal:        General: Tenderness present. No swelling.  Skin:    General: Skin is warm and dry.  Neurological:     General: No focal deficit present.     Mental Status: She is alert.  Psychiatric:        Mood and Affect: Mood normal.        Behavior: Behavior normal.        Thought Content: Thought content normal.        Judgment: Judgment normal.        Assessment & Plan:  1. Primary osteoarthritis involving multiple joints - Will refer to PT. Can continue to take tramadol as needed - Ambulatory referral to Physical Therapy  2. Type 2 diabetes mellitus with other specified complication, without long-term current use of insulin (HCC) - Consider increase in metformin  - Hemoglobin A1c - CBC with Differential/Platelet - Basic Metabolic Panel  3. S/P TAVR (transcatheter aortic valve replacement) - Continue with current medications  - Follow up with Cardiology as directed  4. Essential hypertension - BP elevated today but has been controlled in the past.  - No change in medications  - CBC with Differential/Platelet - Basic Metabolic  Panel  5. Malignant neoplasm of lower lobe of left lung ([place]) -Answered all questions to the best of my ability.  She is going to follow-up with radiation oncology and go from there.  [name], NP

## 2020-01-31 NOTE — Telephone Encounter (Signed)
Referral has been placed. 

## 2020-01-31 NOTE — Patient Instructions (Signed)
It was great seeing you today   We will follow up with you regarding your blood work   Someone from PT will call you to schedule your appointments

## 2020-02-05 ENCOUNTER — Ambulatory Visit: Payer: Medicare Other | Attending: Adult Health | Admitting: Physical Therapy

## 2020-02-05 ENCOUNTER — Other Ambulatory Visit: Payer: Self-pay

## 2020-02-05 ENCOUNTER — Encounter: Payer: Self-pay | Admitting: Physical Therapy

## 2020-02-05 DIAGNOSIS — M545 Low back pain: Secondary | ICD-10-CM | POA: Diagnosis present

## 2020-02-05 DIAGNOSIS — M25561 Pain in right knee: Secondary | ICD-10-CM | POA: Diagnosis present

## 2020-02-05 DIAGNOSIS — M25562 Pain in left knee: Secondary | ICD-10-CM | POA: Insufficient documentation

## 2020-02-05 DIAGNOSIS — G8929 Other chronic pain: Secondary | ICD-10-CM | POA: Insufficient documentation

## 2020-02-05 NOTE — Therapy (Addendum)
[facility] [address] [place], [place], [address] Phone: [phone]   Fax:  [phone]  Physical Therapy Evaluation/Discharge Summary   Patient Details  Name: Tara Fields MRN: [id number] Date of Birth: 05/21/1928 Referring Provider (PT): Dr. [name]   Encounter Date: 02/05/2020   PT End of Session - 02/05/20 2002    Visit Number 1    Date for PT Re-Evaluation 04/29/20    Authorization Type Medicare BCBS    PT Start Time 4193    PT Stop Time 1100    PT Time Calculation (min) 45 min    Activity Tolerance Patient tolerated treatment well           Past Medical History:  Diagnosis Date  . Aortic stenosis    s/p TAVR 26 mm Edwards Sapien 3 THV July 2020  . CAD (coronary artery disease)   . COPD (chronic obstructive pulmonary disease) ([place])   . Diabetes mellitus ([place])   . Diverticulitis   . GERD (gastroesophageal reflux disease)   . HTN (hypertension)   . Hyperlipidemia   . Legally blind in left eye, as defined in [place]   . Tobacco abuse     Past Surgical History:  Procedure Laterality Date  . ABDOMINAL HYSTERECTOMY    . APPENDECTOMY    . BACK SURGERY    . CATARACT EXTRACTION, BILATERAL    . CHOLECYSTECTOMY      There were no vitals filed for this visit.    Subjective Assessment - 02/05/20 1021    Subjective 10 years knees, back surgeries years ago.  No  surgery recommended now secondary to advanced age.  Gets cortisone in knees.  Wants to relieve pain.  Now living with son.  Some steps in/out of the house.  Presents in W/C.  Has RW for household  distances.  Uses W/C for community use.    Patient is accompained by: Family member    Pertinent History Heart valve replaced 1 year ago had PT bedridden 1 week after squats    Limitations Walking    How long can you sit comfortably? not using an issue (knees are fine in this position)    How long can you stand comfortably? short periods of  time    How long can you walk comfortably? household distances    Diagnostic tests severe OA bil knees;  DDD/DJD lumbar spine    Patient Stated Goals Son requests PT only for "pain management" due to negative experience in the past where pt was bedridden following too much exercise    Currently in Pain? No/denies    Pain Score 0-No pain    Pain Location Knee    Pain Orientation Right;Left    Pain Type Chronic pain    Aggravating Factors  standing, bending over, walking    Pain Relieving Factors ice    Multiple Pain Sites Yes    Pain Score 0    Pain Location Back    Pain Type Chronic pain    Pain Radiating Towards bending over; standing              [facility] PT Assessment - 02/05/20 0001      Assessment   Medical Diagnosis osteoarthritis multiple joints (bil knees, back)    Referring Provider (PT) Dr. [name]    Onset Date/Surgical Date --   1 year   Next MD Visit as needed    Prior Therapy yes--negative experience bedridden for > 1 week (  squats)      Precautions   Precautions None      Restrictions   Weight Bearing Restrictions No      Balance Screen   Has the patient fallen in the past 6 months No    Has the patient had a decrease in activity level because of a fear of falling?  No    Is the patient reluctant to leave their home because of a fear of falling?  No      Home Ecologist residence    Living Arrangements Children;Other relatives    Available Help at Discharge [place] - 2 wheels;Toilet riser;Wheelchair - manual      Prior Function   Level of Independence Independent with basic ADLs    Vocation Retired      AROM   Right Knee Extension 10    Right Knee Flexion 125    Left Knee Extension 12    Left Knee Flexion 110      Strength   Overall Strength Comments Able to do 1 SLR right/left     Right Knee Flexion 4-/5    Right Knee Extension 3+/5    Left Knee Flexion 4-/5    Left Knee Extension  3+/5      Flexibility   Soft Tissue Assessment /Muscle Length yes    Hamstrings decreased bil 60 degrees      Transfers   Comments needs UE assist and son's CGA to transfer W/C to chair                       Objective measurements completed on examination: See above findings.               PT Education - 02/05/20 2000    Education Details [email or web address]  supine heel slides, SLR, supine hip abduction; seated LAQ, seated march;  home TENS info (pt has used one in the past but unsure if still works) son requests info on where to Health and safety inspector) Educated Patient;Child(ren);Caregiver(s)    Methods Explanation;Demonstration;Handout    Comprehension Returned demonstration;Verbalized understanding            PT Short Term Goals - 02/05/20 2021      PT SHORT TERM GOAL #1   Title See LTGS             PT Long Term Goals - 02/05/20 2021      PT LONG TERM GOAL #1   Title The patient will demonstrate understanding of a basic HEP for ROM and low level strength    Time 12    Period Weeks    Status New    Target Date 04/29/20      PT LONG TERM GOAL #2   Title The patient will demonstrate understanding of basic self care strategies for pain management    Time 12    Period Weeks    Status New      PT LONG TERM GOAL #3   Title Right knee ROM 8- 130 degrees and left knee ROM 10-115 for greater ease with bed mobility, transfers, and household gait    Time 12    Period Weeks    Status New                  Plan - 02/05/20 2003    Clinical Impression Statement The patient is an 86 year old who presents today  in a wheelchair pushed by her son.  He requests to participate in the evaluation to give background info.  He reports his mother has severe bil knee OA and a long history of LBP with a surgery 30 years ago.  He states their primary interest in attending today is for pain management since his mother was bedridden for over a week following a previous  course of PT in the past.  We discussed treatment options of a home TENs unit and bracing but also the benefits of unloaded (seated and supine) low level ROM and strengthening ex's and iontophoresis.  She will consider aquatic PT for the future.  Recommend 6-8 PT visits to establish a low level HEP and self care strategies for pain management.    Personal Factors and Comorbidities Age;Fitness;Past/Current Experience;Comorbidity 1;Comorbidity 2;Comorbidity 3+    Comorbidities Diabetes, cardiac history; multi region joint pain; chronic pain    Examination-Activity Limitations Transfers;Bend;Stand    Examination-Participation Restrictions Community Activity;Other;Meal Prep    Stability/Clinical Decision Making Stable/Uncomplicated    Clinical Decision Making Low    Rehab Potential Good    PT Frequency 1x / week    PT Duration 12 weeks   6-8 visits max to establish HEP   PT Treatment/Interventions ADLs/Self Care Home Management;Cryotherapy;Electrical Stimulation;Moist Heat;Iontophoresis 54m/ml Dexamethasone;Therapeutic activities;Therapeutic exercise;Neuromuscular re-education;Manual techniques;Patient/family education;Taping    PT Next Visit Plan review supine and seated HEP as needed and add 2-3 additional ex's (pillow squeezes, seated abdominal isometric table top push downs); iontophoresis for knees if cert signed (with caution depending on skin fragility; discuss home TENS if needed    PT [place] Agree with Plan of Care Patient;Family member/caregiver           Patient will benefit from skilled therapeutic intervention in order to improve the following deficits and impairments:  Decreased range of motion, Pain, Decreased strength, Impaired flexibility, Decreased activity tolerance, Difficulty walking  Visit Diagnosis: Chronic pain of left knee - Plan: PT plan of care cert/re-cert  Chronic pain of right knee - Plan: PT plan of care cert/re-cert  Chronic  bilateral low back pain without sciatica - Plan: PT plan of care cert/re-cert   PHYSICAL THERAPY DISCHARGE SUMMARY  Visits from Start of Care: 1  Current functional level related to goals / functional outcomes: The patient's son called to request discharge from for his mother.  She is starting cancer treatments and has a lot going on right now.  She will need a new PT order if she wishes to resume.     Remaining deficits: As above   Education / Equipment: Basic HEP Plan: Patient agrees to discharge.  Patient goals were not met. Patient is being discharged due to the patient's request.  ?????         Problem List Patient Active Problem List   Diagnosis Date Noted  . Pulmonary nodule 1 cm or greater in diameter 01/03/2020  . S/P TAVR (transcatheter aortic valve replacement) 05/03/2019  . Primary osteoarthritis involving multiple joints 05/03/2019  . Other persistent atrial fibrillation ([place] 05/03/2019  . Bilateral carotid artery stenosis 12/01/2017  . 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/13/2014  . Aortic valve disorder 05/03/2014  . Chronic obstructive bronchitis without exacerbation ([place] 05/03/2014  . Derangement of left knee 05/03/2014  . Esophageal reflux 05/03/2014  . Essential hypertension 05/03/2014  . Pure hypercholesterolemia 05/03/2014  . Type II diabetes mellitus ([place] 05/03/2014  . Vitamin D deficiency 05/03/2014   SM[place]etta Board  Konstance Happel, PT 02/05/20 8:29 PM Phone: [phone] Fax: [phone] Alvera Singh 02/05/2020, 8:28 PM  [facility] [address] [place], [place], [address] Phone: [phone]   Fax:  [phone]  Name: Tara Fields MRN: [id number] Date of Birth: 08/30/1927

## 2020-02-05 NOTE — Telephone Encounter (Signed)
PCC's can we check on the referral to the radiology oncology?

## 2020-02-05 NOTE — Patient Instructions (Signed)
Access Code: [email or web address] URL: [email or web address] Date: 02/05/2020 Prepared by: Ruben Im  Exercises Supine Heel Slide - 1 x daily - 7 x weekly - 1 sets - 10 reps Supine Hip Abduction - 1 x daily - 7 x weekly - 1 sets - 10 reps Small Range Straight Leg Raise - 1 x daily - 7 x weekly - 1 sets - 10 reps Seated Long Arc Quad - 1 x daily - 7 x weekly - 1 sets - 10 reps Seated March - 1 x daily - 7 x weekly - 1 sets - 10 reps    TENS UNIT  This is helpful for muscle pain and spasm.   Search and Purchase a TENS 7000 2nd edition at www.tenspros.com or www.amazon.com  (It should be less than $30)     TENS unit instructions:   Do not shower or bathe with the unit on  Turn the unit off before removing electrodes or batteries  If the electrodes lose stickiness add a drop of water to the electrodes after they are disconnected from the unit and place on plastic sheet. If you continued to have difficulty, call the TENS unit company to purchase more electrodes.  Do not apply lotion on the skin area prior to use. Make sure the skin is clean and dry as this will help prolong the life of the electrodes.  After use, always check skin for unusual red areas, rash or other skin difficulties. If there are any skin problems, does not apply electrodes to the same area.  Never remove the electrodes from the unit by pulling the wires.  Do not use the TENS unit or electrodes other than as directed.  Do not change electrode placement without consulting your therapist or physician.  Keep 2 fingers with between each electrode.

## 2020-02-05 NOTE — Telephone Encounter (Signed)
I have sent it over to oncology on 01/31/2020 I resent it urgently over in proficient

## 2020-02-07 ENCOUNTER — Other Ambulatory Visit: Payer: Self-pay | Admitting: Adult Health

## 2020-02-07 ENCOUNTER — Telehealth: Payer: Self-pay | Admitting: Adult Health

## 2020-02-07 DIAGNOSIS — M159 Polyosteoarthritis, unspecified: Secondary | ICD-10-CM

## 2020-02-07 NOTE — Telephone Encounter (Signed)
Pt' son, Tara Fields ok per DPR, stated that the pt runs out of tramadol early because she will take 2-3 a day instead of the one a day. He is wondering if the dosage can be up to two a day so she does not run out so soon?  He is aware that Tommi Rumps is out of the office this week  [id number] - [place], [place] - [place] Phone:  [phone]  Fax:  [phone]

## 2020-02-11 ENCOUNTER — Encounter: Payer: Self-pay | Admitting: Adult Health

## 2020-02-12 ENCOUNTER — Other Ambulatory Visit: Payer: Self-pay | Admitting: Adult Health

## 2020-02-12 DIAGNOSIS — M159 Polyosteoarthritis, unspecified: Secondary | ICD-10-CM

## 2020-02-12 MED ORDER — TRAMADOL HCL 50 MG PO TABS: 50.0000 mg | ORAL_TABLET | Freq: Three times a day (TID) | ORAL | 2 refills | Status: DC

## 2020-02-22 NOTE — Progress Notes (Signed)
Patient here for a consult with Dr. Sondra Come.  Assessment & Plan:  Pulmonary nodule 1 cm or greater in diameter I reviewed the imaging with the patient and her son today.  Explained that this is most likely a primary lung cancer based on appearance and given her smoking history, risk factors.  Also explained that there is a range of possible approaches to take based on her philosophy for her care.  If we were to pursue biopsy then I believe a PET scan will help [place] plan this.  If her adrenal lesion is hypermetabolic then this may be the safest biopsy target.  Alternatively TTNA of the pulmonary nodule or bronchoscopy with navigation are possibilities.  The ENB would of course necessitate general anesthesia with associated risk.  The patient is willing to get the PET scan, will discuss the options with her son.  We will then get back together reviewed the PET scan and decide whether to pursue biopsy versus following serial scans,  and which kind of biopsy to pursue.  Baltazar Apo, MD, PhD 01/03/2020, 5:24 PM [facility] [phone] or if no answer [phone]         Electronically signed by Collene Gobble, MD at 01/03/2020 5:25 PM    [name], NP  Nurse Practitioner  Specialty:  Family Medicine  Telephone Encounter  Signed  Encounter Date:  12/28/2019          Signed       I spoke to the patient's son and informed him of Tara Fields's CT results which showed  IMPRESSION: 1. 2.4 x 1.8 cm spiculated nodule in the superior segment of the left lower lobe, suspicious for a primary lung carcinoma. Recommend further evaluation with a PET-CT and/or tissue sampling. 2. 4 mm indeterminate right lower lobe nodule. 3. 2.5 x 1.8 cm left adrenal mass. This could represent an adenoma or metastasis. 4. Enlarged central pulmonary arteries, compatible with pulmonary arterial hypertension. 5. COPD with centrilobular emphysema. 6. Calcific coronary artery and aortic  atherosclerosis.   He will relay this information to his mother and he is okay with me referring to pulmonary for biopsy        Electronically signed by [name], NP at 12/28/2019 2:07 PM  Telephone on 12/28/2019   Telephone on 12/28/2019     Detailed Report    Tobacco/Marijuana/Snuff/ETOH use: Former Smoker  Past/Anticipated interventions by cardiothoracic surgery, if any:   Past/Anticipated interventions by medical oncology, if any:   Signs/Symptoms  Weight changes, if any: Stable  Respiratory complaints, if any: Mild SOB  Hemoptysis, if any: No  Pain issues, if any:  No  SAFETY ISSUES:  Prior radiation? No  Pacemaker/ICD? No  Possible current pregnancy?Postmenopausal  Is the patient on methotrexate? No  Current Complaints / other details:   She has bad arthritis.  BP (!) 159/74 (BP Location: Left Arm, Patient Position: Sitting, Cuff Size: Large)   Pulse 89   Temp 98.5 F (36.9 C)   Resp 20   Ht 5' (1.524 m)   Wt 162 lb 3.2 oz (73.6 kg)   SpO2 98%   BMI 31.68 kg/m    Wt Readings from Last 3 Encounters:  02/25/20 162 lb 3.2 oz (73.6 kg)  01/03/20 158 lb 12.8 oz (72 kg)  08/20/19 154 lb 12.8 oz (70.2 kg)

## 2020-02-25 ENCOUNTER — Encounter: Payer: Self-pay | Admitting: Radiation Oncology

## 2020-02-25 ENCOUNTER — Ambulatory Visit
Admission: RE | Admit: 2020-02-25 | Discharge: 2020-02-25 | Disposition: A | Discharge status: Home / Self Care | Payer: Medicare Other | Source: Ambulatory Visit | Attending: Radiation Oncology | Admitting: Radiation Oncology

## 2020-02-25 ENCOUNTER — Other Ambulatory Visit: Payer: Self-pay

## 2020-02-25 VITALS — BP 159/74 | HR 89 | Temp 98.5°F | Resp 20 | Ht 60.0 in | Wt 162.2 lb

## 2020-02-25 DIAGNOSIS — J439 Emphysema, unspecified: Secondary | ICD-10-CM | POA: Diagnosis not present

## 2020-02-25 DIAGNOSIS — Z87891 Personal history of nicotine dependence: Secondary | ICD-10-CM | POA: Insufficient documentation

## 2020-02-25 DIAGNOSIS — I2721 Secondary pulmonary arterial hypertension: Secondary | ICD-10-CM | POA: Insufficient documentation

## 2020-02-25 DIAGNOSIS — E785 Hyperlipidemia, unspecified: Secondary | ICD-10-CM | POA: Insufficient documentation

## 2020-02-25 DIAGNOSIS — Z7901 Long term (current) use of anticoagulants: Secondary | ICD-10-CM | POA: Insufficient documentation

## 2020-02-25 DIAGNOSIS — R911 Solitary pulmonary nodule: Secondary | ICD-10-CM | POA: Diagnosis not present

## 2020-02-25 DIAGNOSIS — Z7982 Long term (current) use of aspirin: Secondary | ICD-10-CM | POA: Insufficient documentation

## 2020-02-25 DIAGNOSIS — I350 Nonrheumatic aortic (valve) stenosis: Secondary | ICD-10-CM | POA: Diagnosis not present

## 2020-02-25 DIAGNOSIS — E119 Type 2 diabetes mellitus without complications: Secondary | ICD-10-CM | POA: Insufficient documentation

## 2020-02-25 DIAGNOSIS — I2510 Atherosclerotic heart disease of native coronary artery without angina pectoris: Secondary | ICD-10-CM | POA: Diagnosis not present

## 2020-02-25 DIAGNOSIS — K219 Gastro-esophageal reflux disease without esophagitis: Secondary | ICD-10-CM | POA: Insufficient documentation

## 2020-02-25 DIAGNOSIS — I10 Essential (primary) hypertension: Secondary | ICD-10-CM | POA: Diagnosis not present

## 2020-02-25 DIAGNOSIS — F17210 Nicotine dependence, cigarettes, uncomplicated: Secondary | ICD-10-CM | POA: Insufficient documentation

## 2020-02-25 DIAGNOSIS — I700 Atherosclerosis of aorta: Secondary | ICD-10-CM | POA: Diagnosis not present

## 2020-02-25 NOTE — Progress Notes (Signed)
Radiation Oncology         (336) [phone] ________________________________  Initial Outpatient Consultation  Name: Tara Fields MRN: [id number]  Date: 02/25/2020  DOB: 09-28-1927  VF:IEPPIRJJ, Tommi Rumps, NP  Collene Gobble, MD   REFERRING PHYSICIAN: Collene Gobble, MD  DIAGNOSIS: The encounter diagnosis was Pulmonary nodule 1 cm or greater in diameter.  Left lower lobe pulmonary nodule compatible with primary bronchogenic neoplasm, biopsy deferred  HISTORY OF PRESENT ILLNESS::Tara Fields is a 86 y.o. female who is seen as a courtesy of Dr. Lamonte Sakai for an opinion concerning radiation therapy as part of management for her recently diagnosed lung cancer. Today, she is accompanied by her son on evaluation today.   The patient presented to [name], FNP, on 11/23/2019 with complaint of lower extremity edema. Chest x-ray on that same day showed a mild indeterminate patchy/nodular posterior left upper lobe opacity. Follow-up chest x-ray on 04/272021 showed a persistent left upper lobe density that was most compatible with a lung nodule and possible malignancy.  CT scan of chest on 12/27/2019 showed a 2.4 x 1.8 cm spiculated nodule in the superior segment of the left lower lobe that was suspicious for a primary lung carcinoma. There was also a 4 mm indeterminate right lower lobe nodule and a 2.5 x 1.8 cm left adrenal mass that could represent an adenoma or metastasis. Finally, it showed enlarged central pulmonary arteries compatible with pulmonary arterial hypertension, COPD with centrilobular emphysema, and calcific coronary artery and aortic atherosclerosis.   The patient was referred to Dr. Lamonte Sakai and was seen in consultation on 01/03/2020. At that time, he recommended proceeding with a PET scan and biopsy.  PET scan on 01/16/2020 showed a 2.4 cm spiculated left lower lobe nodule that was compatible with primary bronchogenic neoplasm. It also showed a 5 mm right lower lobe nodule that  was non-FDG avid but beneath the size threshold for PET sensitivity. Finally, there was a 2.2 cm benign left adrenal adenoma. There were no findings specific for metastatic disease.  Dr. Lamonte Sakai spoke with the patient's son on 01/21/2020 and discussed how the best target for biopsy would be the left lung nodule. The patient's son questioned whether she was a candidate for referral to radiation therapy without tissue diagnosis. This was discussed at the thoracic conference on 01/24/2020 and referral was given.  PREVIOUS RADIATION THERAPY: No  PAST MEDICAL HISTORY:  Past Medical History:  Diagnosis Date  . Aortic stenosis    s/p TAVR 26 mm Edwards Sapien 3 THV July 2020  . CAD (coronary artery disease)   . COPD (chronic obstructive pulmonary disease) ([place])   . Diabetes mellitus ([place])   . Diverticulitis   . GERD (gastroesophageal reflux disease)   . HTN (hypertension)   . Hyperlipidemia   . Legally blind in left eye, as defined in [place]   . Tobacco abuse     PAST SURGICAL HISTORY: Past Surgical History:  Procedure Laterality Date  . ABDOMINAL HYSTERECTOMY    . APPENDECTOMY    . BACK SURGERY    . CATARACT EXTRACTION, BILATERAL    . CHOLECYSTECTOMY      FAMILY HISTORY:  Family History  Problem Relation Age of Onset  . Diabetes Mother   . Stroke Brother   . Cerebral palsy Brother   . Coronary artery disease Neg Hx     SOCIAL HISTORY:  Social History   Tobacco Use  . Smoking status: Former Smoker    Packs/day: 1.00    Years:  70.00    Pack years: 70.00    Types: Cigarettes    Quit date: 12/15/2019    Years since quitting: 0.1  . Smokeless tobacco: Never Used  . Tobacco comment: Pt states quiting a year ago. 01/03/20 [facility]  Substance Use Topics  . Alcohol use: No    Alcohol/week: 0.0 standard drinks  . Drug use: No    ALLERGIES:  Allergies  Allergen Reactions  . Amlodipine Besy-Benazepril Hcl Other (See Comments)    cough  . Hctz [Hydrochlorothiazide] Other (See  Comments)  . Diclofenac Sodium Diarrhea  . Hydralazine     (Apresoline *ANTIHYPERTENSIVES*) HEPATITIS  . Methylprednisolone     (Medrol (Pak) *CORTICOSTEROIDS*) STATES ANGER, BUT ALOT OF ENERGY    MEDICATIONS:  Current Outpatient Medications  Medication Sig Dispense Refill  . acetaminophen (TYLENOL) 325 MG tablet Take 650 mg by mouth every 6 (six) hours as needed (SEVERE PAIN).    [name] aspirin 81 MG chewable tablet Chew 81 mg by mouth.    [name] atorvastatin (LIPITOR) 40 MG tablet TAKE 1 TABLET (40 MG TOTAL) BY MOUTH DAILY AT 6 PM. 90 tablet 1  . Cholecalciferol (VITAMIN D-3 PO) Take 5,000 Units by mouth daily.    . Coenzyme Q10-Fish Oil-Vit E (CO-Q 10 OMEGA-3 FISH OIL PO) Take 400 mg by mouth daily.    [name] ELIQUIS 5 MG TABS tablet TAKE 1 TABLET BY MOUTH TWICE A DAY 180 tablet 1  . fenofibrate 160 MG tablet Take 1 tablet (160 mg total) by mouth daily. 90 tablet 1  . ferrous gluconate (FERGON) 324 MG tablet TAKE 1 TABLET BY MOUTH DAILY WITH BREAKFAST 90 tablet 1  . losartan (COZAAR) 100 MG tablet Take 100 mg by mouth daily.   1  . metoprolol tartrate (LOPRESSOR) 50 MG tablet Take 1 tablet (50 mg total) by mouth 2 (two) times daily. 180 tablet 1  . Multiple Vitamins-Minerals (WOMENS 50+ MULTI VITAMIN/MIN PO) Take 1 tablet by mouth daily. CENTRUM SILVER    . terazosin (HYTRIN) 10 MG capsule TAKE 1 CAPSULE (10 MG TOTAL) BY MOUTH AT BEDTIME. 90 capsule 1  . traMADol (ULTRAM) 50 MG tablet Take 1 tablet (50 mg total) by mouth in the morning, at noon, and at bedtime. 90 tablet 2  . amLODipine (NORVASC) 5 MG tablet Take 1 tablet (5 mg total) by mouth daily. 90 tablet 3  . potassium chloride (KLOR-CON M10) 10 MEQ tablet Take 1 tablet (10 mEq total) by mouth 2 (two) times daily. (Patient not taking: Reported on 02/25/2020) 60 tablet 0  . PROAIR HFA 108 (90 Base) MCG/ACT inhaler Use 2 inhalations every 4-6 hours as needed for wheezing or coughing. (Patient not taking: Reported on 02/25/2020) 1 Inhaler 3   No  current facility-administered medications for this encounter.    REVIEW OF SYSTEMS:  A 10+ POINT REVIEW OF SYSTEMS WAS OBTAINED including neurology, dermatology, psychiatry, cardiac, respiratory, lymph, extremities, GI, GU, musculoskeletal, constitutional, reproductive, HEENT.  She denies any pain within the chest area significant cough or hemoptysis.  Patient denies any respiratory difficulties.  She smoked approximately 1 pack of cigarettes per day for 70 years.  She denies ever being on oxygen.  Quit smoking in May of this year.  Ambulates with the assistance of a walker at home.   PHYSICAL EXAM:  height is 5' (1.524 m) and weight is 162 lb 3.2 oz (73.6 kg). Her temperature is 98.5 F (36.9 C). Her blood pressure is 159/74 (abnormal) and her pulse is 89.  Her respiration is 20 and oxygen saturation is 98%.   General: Alert and oriented, in no acute distress, exhibits some signs of dementia HEENT: Head is normocephalic. Extraocular movements are intact.  Neck: Neck is supple, no palpable cervical or supraclavicular lymphadenopathy. Heart: Regular in rate and rhythm with no murmurs, rubs, or gallops. Chest: Clear to auscultation bilaterally, with no rhonchi, wheezes, or rales. Abdomen: Soft, nontender, nondistended, with no rigidity or guarding. Extremities: No cyanosis or edema. Lymphatics: see Neck Exam Skin: No concerning lesions. Musculoskeletal: symmetric strength and muscle tone throughout. Neurologic: Cranial nerves II through XII are grossly intact. No obvious focalities. Speech is fluent. Coordination is intact. Psychiatric: Judgment and insight are intact. Affect is appropriate.   ECOG = 2  0 - Asymptomatic (Fully active, able to carry on all predisease activities without restriction)  1 - Symptomatic but completely ambulatory (Restricted in physically strenuous activity but ambulatory and able to carry out work of a light or sedentary nature. For example, light housework, office  work)  2 - Symptomatic, <50% in bed during the day (Ambulatory and capable of all self care but unable to carry out any work activities. Up and about more than 50% of waking hours)  3 - Symptomatic, >50% in bed, but not bedbound (Capable of only limited self-care, confined to bed or chair 50% or more of waking hours)  4 - Bedbound (Completely disabled. Cannot carry on any self-care. Totally confined to bed or chair)  5 - Death   Tara Fields MM, Creech RH, Tormey DC, et al. [phone]). "Toxicity and response criteria of the [facility]". [place] Oncol. 5 (6): 649-55  LABORATORY DATA:  Lab Results  Component Value Date   WBC 5.9 01/31/2020   HGB 13.3 01/31/2020   HCT 39.3 01/31/2020   MCV 92.2 01/31/2020   PLT 229.0 01/31/2020   NEUTROABS 4.0 01/31/2020   Lab Results  Component Value Date   NA 139 01/31/2020   K 4.4 01/31/2020   CL 101 01/31/2020   CO2 29 01/31/2020   GLUCOSE 119 (H) 01/31/2020   CREATININE 0.92 01/31/2020   CALCIUM 9.9 01/31/2020      RADIOGRAPHY: No results found.    IMPRESSION: Left lower lobe pulmonary nodule compatible with primary bronchogenic neoplasm, biopsy deferred  We discussed that the patient most likely has a primary bronchogenic neoplasm.  We discussed potential options for biopsy including CT-guided biopsy and bronchoscopy and subsequent biopsy.  Given the patient's advanced age and other medical issues empiric treatment without tissue diagnosis  would be a very reasonable option for this patient.  Patient and her son are somewhat hesitant in considering biopsy given the potential complications of either procedure.  Today, I talked to the patient and family about the findings and work-up thus far.  We discussed the natural history of lung cancer and general treatment, highlighting the role of radiotherapy (SBRT) in the management.  We discussed the available radiation techniques, and focused on the details of logistics and  delivery.  We reviewed the anticipated acute and late sequelae associated with radiation in this setting.  The patient was encouraged to ask questions that I answered to the best of my ability.  A patient consent form was discussed and signed.  We retained a copy for our records.  The patient would like to proceed with radiation and will be scheduled for CT simulation.  PLAN: Patient will return on July 15 for stereotactic body radiation therapy planning.  Treatments to  begin a week later.  Anticipate between 3 and 5 treatments directed at the solitary pulmonary nodule in the superior segment of the left lower lobe.  Total time spent in this encounter was 60 minutes which included reviewing the patient's most recent imaging (chest x-rays, chest CT scan, PET scan), consultations, follow-ups, physical examination, and documentation.   ------------------------------------------------  Blair Promise, PhD, MD  This document serves as a record of services personally performed by Gery Pray, MD. It was created on his behalf by Clerance Lav, a trained medical scribe. The creation of this record is based on the scribe's personal observations and the provider's statements to them. This document has been checked and approved by the attending provider.

## 2020-02-28 ENCOUNTER — Other Ambulatory Visit: Payer: Self-pay

## 2020-02-28 ENCOUNTER — Ambulatory Visit
Admission: RE | Admit: 2020-02-28 | Discharge: 2020-02-28 | Disposition: A | Discharge status: Home / Self Care | Payer: Medicare Other | Source: Ambulatory Visit | Attending: Radiation Oncology | Admitting: Radiation Oncology

## 2020-02-28 DIAGNOSIS — Z510 Encounter for antineoplastic radiation therapy: Secondary | ICD-10-CM | POA: Diagnosis present

## 2020-02-28 DIAGNOSIS — C3432 Malignant neoplasm of lower lobe, left bronchus or lung: Secondary | ICD-10-CM | POA: Insufficient documentation

## 2020-03-04 DIAGNOSIS — Z510 Encounter for antineoplastic radiation therapy: Secondary | ICD-10-CM | POA: Diagnosis not present

## 2020-03-06 ENCOUNTER — Ambulatory Visit: Payer: Medicare Other | Admitting: Physical Therapy

## 2020-03-07 ENCOUNTER — Telehealth: Payer: Self-pay

## 2020-03-07 NOTE — Telephone Encounter (Signed)
Patient's son called to advise Dr. Sondra Come that his mother fell into a chair but not to the floor. She has sore buttocks and he will take her to the ortho MD to be checked out. Patient is okay otherwise and the patient wanted Dr. Sondra Come to know. Son was advised that will not affect her treatment at this time.

## 2020-03-09 NOTE — Progress Notes (Signed)
  Radiation Oncology         (336) [phone] ________________________________  Name: Tara Fields MRN: [id number]  Date: 03/11/2020  DOB: 1928/04/05  Stereotactic Body Radiotherapy Treatment Procedure Note  NARRATIVE:  Tara Fields was brought to the stereotactic radiation treatment machine and placed supine on the CT couch. The patient was set up for stereotactic body radiotherapy on the body fix pillow.  3D TREATMENT PLANNING AND DOSIMETRY:  The patient's radiation plan was reviewed and approved prior to starting treatment.  It showed 3-dimensional radiation distributions overlaid onto the planning CT.  The [facility] for the target structures as well as the organs at risk were reviewed. The documentation of this is filed in the radiation oncology EMR.  SIMULATION VERIFICATION:  The patient underwent CT imaging on the treatment unit.  These were carefully aligned to document that the ablative radiation dose would cover the target volume and maximally spare the nearby organs at risk according to the planned distribution.  SPECIAL TREATMENT PROCEDURE: Tara Fields received high dose ablative stereotactic body radiotherapy to the planned target volume without unforeseen complications. Treatment was delivered uneventfully. The high doses associated with stereotactic body radiotherapy and the significant potential risks require careful treatment set up and patient monitoring constituting a special treatment procedure   STEREOTACTIC TREATMENT MANAGEMENT:  Following delivery, the patient was evaluated clinically. The patient tolerated treatment without significant acute effects, and was discharged to home in stable condition.    PLAN: Continue treatment as planned.  ________________________________  Blair Promise, PhD, MD  This document serves as a record of services personally performed by Gery Pray, MD. It was created on his behalf by Clerance Lav, a trained medical scribe. The  creation of this record is based on the scribe's personal observations and the provider's statements to them. This document has been checked and approved by the attending provider.

## 2020-03-11 ENCOUNTER — Ambulatory Visit
Admission: RE | Admit: 2020-03-11 | Discharge: 2020-03-11 | Disposition: A | Discharge status: Home / Self Care | Payer: Medicare Other | Source: Ambulatory Visit | Attending: Radiation Oncology | Admitting: Radiation Oncology

## 2020-03-11 ENCOUNTER — Other Ambulatory Visit: Payer: Self-pay

## 2020-03-11 DIAGNOSIS — Z510 Encounter for antineoplastic radiation therapy: Secondary | ICD-10-CM | POA: Diagnosis not present

## 2020-03-11 DIAGNOSIS — R911 Solitary pulmonary nodule: Secondary | ICD-10-CM

## 2020-03-12 ENCOUNTER — Encounter: Payer: Medicare Other | Admitting: Physical Therapy

## 2020-03-12 NOTE — Progress Notes (Signed)
°  Radiation Oncology         (336) [phone] ________________________________  Name: Tara Fields MRN: [id number]  Date: 03/13/2020  DOB: 02-11-1928  Stereotactic Body Radiotherapy Treatment Procedure Note  NARRATIVE:  Tara Fields was brought to the stereotactic radiation treatment machine and placed supine on the CT couch. The patient was set up for stereotactic body radiotherapy on the body fix pillow.  3D TREATMENT PLANNING AND DOSIMETRY:  The patient's radiation plan was reviewed and approved prior to starting treatment.  It showed 3-dimensional radiation distributions overlaid onto the planning CT.  The [facility] for the target structures as well as the organs at risk were reviewed. The documentation of this is filed in the radiation oncology EMR.  SIMULATION VERIFICATION:  The patient underwent CT imaging on the treatment unit.  These were carefully aligned to document that the ablative radiation dose would cover the target volume and maximally spare the nearby organs at risk according to the planned distribution.  SPECIAL TREATMENT PROCEDURE: Tara Fields received high dose ablative stereotactic body radiotherapy to the planned target volume without unforeseen complications. Treatment was delivered uneventfully. The high doses associated with stereotactic body radiotherapy and the significant potential risks require careful treatment set up and patient monitoring constituting a special treatment procedure   STEREOTACTIC TREATMENT MANAGEMENT:  Following delivery, the patient was evaluated clinically. The patient tolerated treatment without significant acute effects, and was discharged to home in stable condition.    PLAN: Continue treatment as planned.  ________________________________  Blair Promise, PhD, MD  This document serves as a record of services personally performed by Gery Pray, MD. It was created on his behalf by Clerance Lav, a trained medical scribe. The  creation of this record is based on the scribe's personal observations and the provider's statements to them. This document has been checked and approved by the attending provider.

## 2020-03-13 ENCOUNTER — Other Ambulatory Visit: Payer: Self-pay

## 2020-03-13 ENCOUNTER — Ambulatory Visit
Admission: RE | Admit: 2020-03-13 | Discharge: 2020-03-13 | Disposition: A | Discharge status: Home / Self Care | Payer: Medicare Other | Source: Ambulatory Visit | Attending: Radiation Oncology | Admitting: Radiation Oncology

## 2020-03-13 DIAGNOSIS — R911 Solitary pulmonary nodule: Secondary | ICD-10-CM

## 2020-03-13 DIAGNOSIS — Z510 Encounter for antineoplastic radiation therapy: Secondary | ICD-10-CM | POA: Diagnosis not present

## 2020-03-18 ENCOUNTER — Other Ambulatory Visit: Payer: Self-pay

## 2020-03-18 ENCOUNTER — Ambulatory Visit
Admission: RE | Admit: 2020-03-18 | Discharge: 2020-03-18 | Disposition: A | Discharge status: Home / Self Care | Payer: Medicare Other | Source: Ambulatory Visit | Attending: Radiation Oncology | Admitting: Radiation Oncology

## 2020-03-18 ENCOUNTER — Encounter: Payer: Medicare Other | Admitting: Physical Therapy

## 2020-03-18 ENCOUNTER — Encounter: Payer: Self-pay | Admitting: Radiation Oncology

## 2020-03-18 DIAGNOSIS — Z510 Encounter for antineoplastic radiation therapy: Secondary | ICD-10-CM | POA: Diagnosis present

## 2020-03-18 DIAGNOSIS — C3432 Malignant neoplasm of lower lobe, left bronchus or lung: Secondary | ICD-10-CM | POA: Diagnosis not present

## 2020-03-24 NOTE — Progress Notes (Incomplete)
  Patient Name: Tara Fields MRN: [id number] DOB: 27-Oct-1927 Referring Physician: Baltazar Apo (Profile Not Attached) Date of Service: 03/18/2020 [facility], [place]                                                        End Of Treatment Note  Diagnoses: R91.1-Solitary pulmonary nodule  Cancer Staging: Left lower lobe pulmonary nodule compatible with primary bronchogenic neoplasm, biopsy deferred  Intent: Curative  Radiation Treatment Dates: 03/11/2020 through 03/18/2020 Site Technique Total Dose (Gy) Dose per Fx (Gy) Completed Fx Beam Energies  Lung, Left: Lung_Lt IMRT 54/54 18 3/3 6XFFF   Narrative: The patient tolerated radiation therapy relatively well. She reported no radiation-related toxicities.  Plan: The patient will follow-up with radiation oncology in one month.  ________________________________________________   Blair Promise, PhD, MD  This document serves as a record of services personally performed by Gery Pray, MD. It was created on his behalf by Clerance Lav, a trained medical scribe. The creation of this record is based on the scribe's personal observations and the provider's statements to them. This document has been checked and approved by the attending provider.

## 2020-03-26 ENCOUNTER — Encounter: Payer: Self-pay | Admitting: Adult Health

## 2020-03-26 ENCOUNTER — Encounter: Payer: Medicare Other | Admitting: Physical Therapy

## 2020-04-01 ENCOUNTER — Other Ambulatory Visit: Payer: Self-pay | Admitting: Adult Health

## 2020-04-01 MED ORDER — ASPIRIN 81 MG PO CHEW: 81.0000 mg | CHEWABLE_TABLET | Freq: Every day | ORAL | 3 refills | Status: DC

## 2020-04-02 ENCOUNTER — Encounter: Payer: Medicare Other | Admitting: Physical Therapy

## 2020-04-08 ENCOUNTER — Encounter: Payer: Medicare Other | Admitting: Physical Therapy

## 2020-05-17 ENCOUNTER — Telehealth: Payer: Self-pay | Admitting: Adult Health

## 2020-05-22 ENCOUNTER — Other Ambulatory Visit: Payer: Self-pay

## 2020-05-22 MED ORDER — LOSARTAN POTASSIUM 100 MG PO TABS: 100.0000 mg | ORAL_TABLET | Freq: Every day | ORAL | 0 refills | Status: DC

## 2020-05-22 NOTE — Telephone Encounter (Signed)
losartan (COZAAR) 100 MG tablet  fenofibrate 160 MG tablet   [id number] - [place], [place] - [address] Phone:  [phone]  Fax:  [phone]

## 2020-05-22 NOTE — Telephone Encounter (Signed)
I have never prescribed losartan and does not look like she has had a refill since 2016.  I am unsure if she actually needs this medication

## 2020-05-22 NOTE — Telephone Encounter (Signed)
Spoke with the pts son Louie Casa and informed him of the message below.  Louie Casa stated she pt has been taking this medication from Dr Pascal Lux he thought and has had refills from the pharmacy. I advised he contact the pharmacy for further info as Losartan is not on her medication list and reminded of message from PCP.

## 2020-05-29 ENCOUNTER — Other Ambulatory Visit: Payer: Self-pay

## 2020-05-29 ENCOUNTER — Ambulatory Visit (INDEPENDENT_AMBULATORY_CARE_PROVIDER_SITE_OTHER): Payer: Medicare Other

## 2020-05-29 DIAGNOSIS — Z23 Encounter for immunization: Secondary | ICD-10-CM

## 2020-05-30 NOTE — Telephone Encounter (Signed)
RB- please advise on pt email:  Tara Fields finished her radiation treatments in August, when should she schedule a follow up visit?

## 2020-06-02 ENCOUNTER — Other Ambulatory Visit: Payer: Self-pay | Admitting: Adult Health

## 2020-06-02 DIAGNOSIS — M159 Polyosteoarthritis, unspecified: Secondary | ICD-10-CM

## 2020-06-04 NOTE — Telephone Encounter (Signed)
Lets set up an office visit either in December or January so we can make sure her breathing is doing okay following radiation treatment.

## 2020-07-07 ENCOUNTER — Telehealth: Payer: Self-pay | Admitting: Emergency Medicine

## 2020-07-07 NOTE — Telephone Encounter (Signed)
Called and spoke to pt's son, Louie Casa. Appt scheduled for 08/04/20. Louie Casa verbalized understanding and denied any further questions or concerns at this time.

## 2020-07-15 ENCOUNTER — Emergency Department (HOSPITAL_COMMUNITY)
Admission: EM | Admit: 2020-07-15 | Discharge: 2020-07-15 | Disposition: A | Discharge status: Home / Self Care | Payer: Medicare Other | Attending: Emergency Medicine | Admitting: Emergency Medicine

## 2020-07-15 ENCOUNTER — Other Ambulatory Visit: Payer: Self-pay

## 2020-07-15 ENCOUNTER — Encounter (HOSPITAL_COMMUNITY): Payer: Self-pay | Admitting: Emergency Medicine

## 2020-07-15 ENCOUNTER — Emergency Department (HOSPITAL_COMMUNITY): Payer: Medicare Other

## 2020-07-15 DIAGNOSIS — I10 Essential (primary) hypertension: Secondary | ICD-10-CM | POA: Diagnosis not present

## 2020-07-15 DIAGNOSIS — X500XXA Overexertion from strenuous movement or load, initial encounter: Secondary | ICD-10-CM | POA: Diagnosis not present

## 2020-07-15 DIAGNOSIS — Z7982 Long term (current) use of aspirin: Secondary | ICD-10-CM | POA: Insufficient documentation

## 2020-07-15 DIAGNOSIS — R0789 Other chest pain: Secondary | ICD-10-CM | POA: Diagnosis present

## 2020-07-15 DIAGNOSIS — S161XXA Strain of muscle, fascia and tendon at neck level, initial encounter: Secondary | ICD-10-CM | POA: Diagnosis not present

## 2020-07-15 DIAGNOSIS — Z7901 Long term (current) use of anticoagulants: Secondary | ICD-10-CM | POA: Diagnosis not present

## 2020-07-15 DIAGNOSIS — M5412 Radiculopathy, cervical region: Secondary | ICD-10-CM | POA: Insufficient documentation

## 2020-07-15 DIAGNOSIS — M25512 Pain in left shoulder: Secondary | ICD-10-CM | POA: Diagnosis not present

## 2020-07-15 DIAGNOSIS — E119 Type 2 diabetes mellitus without complications: Secondary | ICD-10-CM | POA: Diagnosis not present

## 2020-07-15 DIAGNOSIS — Z87891 Personal history of nicotine dependence: Secondary | ICD-10-CM | POA: Diagnosis not present

## 2020-07-15 DIAGNOSIS — R079 Chest pain, unspecified: Secondary | ICD-10-CM

## 2020-07-15 DIAGNOSIS — Z79899 Other long term (current) drug therapy: Secondary | ICD-10-CM | POA: Insufficient documentation

## 2020-07-15 LAB — CBC
HCT: 41.8 % (ref 36.0–46.0)
Hemoglobin: 13.1 g/dL (ref 12.0–15.0)
MCH: 29.8 pg (ref 26.0–34.0)
MCHC: 31.3 g/dL (ref 30.0–36.0)
MCV: 95 fL (ref 80.0–100.0)
Platelets: 229 10*3/uL (ref 150–400)
RBC: 4.4 MIL/uL (ref 3.87–5.11)
RDW: 13.4 % (ref 11.5–15.5)
WBC: 7.3 10*3/uL (ref 4.0–10.5)
nRBC: 0 % (ref 0.0–0.2)

## 2020-07-15 LAB — BASIC METABOLIC PANEL
Anion gap: 9 (ref 5–15)
BUN: 14 mg/dL (ref 8–23)
CO2: 22 mmol/L (ref 22–32)
Calcium: 9.2 mg/dL (ref 8.9–10.3)
Chloride: 102 mmol/L (ref 98–111)
Creatinine, Ser: 0.84 mg/dL (ref 0.44–1.00)
GFR, Estimated: >60 mL/min (ref >60)
Glucose, Bld: 147 mg/dL — ABNORMAL HIGH (ref 70–99)
Potassium: 3.4 mmol/L — ABNORMAL LOW (ref 3.5–5.1)
Sodium: 133 mmol/L — ABNORMAL LOW (ref 135–145)

## 2020-07-15 LAB — TROPONIN I (HIGH SENSITIVITY)
Troponin I (High Sensitivity): 7 ng/L (ref <18)
Troponin I (High Sensitivity): 7 ng/L (ref <18)

## 2020-07-15 MED ORDER — CYCLOBENZAPRINE HCL 10 MG PO TABS: 10.0000 mg | ORAL_TABLET | Freq: Two times a day (BID) | ORAL | 0 refills | Status: DC | PRN

## 2020-07-15 MED ORDER — POTASSIUM CHLORIDE CRYS ER 20 MEQ PO TBCR
20.0000 meq | EXTENDED_RELEASE_TABLET | Freq: Once | ORAL | Status: AC
  Administered 2020-07-15: 20 meq via ORAL
  Filled 2020-07-15: qty 1

## 2020-07-15 MED ORDER — CYCLOBENZAPRINE HCL 10 MG PO TABS
10.0000 mg | ORAL_TABLET | Freq: Once | ORAL | Status: AC
  Administered 2020-07-15: 10 mg via ORAL
  Filled 2020-07-15: qty 1

## 2020-07-15 NOTE — ED Triage Notes (Signed)
Patient arrives to ED with complaints of left sided chest pain for the last couple of days. Pt states the pain is constant and has gotten worse. Pain relieves with rest. Describes pain as achy. CP travels to neck and down left arm. Pt has been moving homes this week.

## 2020-07-15 NOTE — ED Provider Notes (Signed)
[facility] EMERGENCY DEPARTMENT Provider Note   CSN: [id number] Arrival date & time: 07/15/20  0757     History Chief Complaint  Patient presents with  . Chest Pain    Tara Fields is a 86 y.o. female.  HPI     Left sided chest pain upper, shoulder pain, neck pain, and down the arm on left worse with movement Hx of prior back surgery, TAVR Smoked in earlier years Maybe doing more than used to   When resting is 7/10, with movement is more intense, lifting, reaching makes it worse No falls or trauma in last 3 mos  No left arm weakness, numbness Has arthritis pain/difficulty grasping  Tried heat and ice  No shortness of breath, nausea, vomiting, diaphoresis No fevers, cough No hx of MI  Past Medical History:  Diagnosis Date  . Aortic stenosis    s/p TAVR 26 mm Edwards Sapien 3 THV July 2020  . CAD (coronary artery disease)   . COPD (chronic obstructive pulmonary disease) ([place])   . Diabetes mellitus ([place])   . Diverticulitis   . GERD (gastroesophageal reflux disease)   . HTN (hypertension)   . Hyperlipidemia   . Legally blind in left eye, as defined in [place]   . Tobacco abuse     Patient Active Problem List   Diagnosis Date Noted  . Pulmonary nodule 1 cm or greater in diameter 01/03/2020  . S/P TAVR (transcatheter aortic valve replacement) 05/03/2019  . Primary osteoarthritis involving multiple joints 05/03/2019  . Other persistent atrial fibrillation ([place]) 05/03/2019  . Bilateral carotid artery stenosis 12/01/2017  . 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/13/2014  . Aortic valve disorder 05/03/2014  . Chronic obstructive bronchitis without exacerbation ([place]) 05/03/2014  . Derangement of left knee 05/03/2014  . Esophageal reflux 05/03/2014  . Essential hypertension 05/03/2014  . Pure hypercholesterolemia 05/03/2014  . Type II diabetes mellitus ([place]) 05/03/2014  . Vitamin D deficiency 05/03/2014    Past Surgical History:  Procedure  Laterality Date  . ABDOMINAL HYSTERECTOMY    . APPENDECTOMY    . BACK SURGERY    . CATARACT EXTRACTION, BILATERAL    . CHOLECYSTECTOMY       OB History   No obstetric history on file.     Family History  Problem Relation Age of Onset  . Diabetes Mother   . Stroke Brother   . Cerebral palsy Brother   . Coronary artery disease Neg Hx     Social History   Tobacco Use  . Smoking status: Former Smoker    Packs/day: 1.00    Years: 70.00    Pack years: 70.00    Types: Cigarettes    Quit date: 12/15/2019    Years since quitting: 0.5  . Smokeless tobacco: Never Used  . Tobacco comment: Pt states quiting a year ago. 01/03/20 ARJ  Substance Use Topics  . Alcohol use: No    Alcohol/week: 0.0 standard drinks  . Drug use: No    Home Medications Prior to Admission medications   Medication Sig Start Date End Date Taking? Authorizing Provider  acetaminophen (TYLENOL) 325 MG tablet Take 650 mg by mouth every 6 (six) hours as needed (SEVERE PAIN).    [provider]  amLODipine (NORVASC) 5 MG tablet Take 1 tablet (5 mg total) by mouth daily. 08/02/19 10/31/19  [name], NP  aspirin 81 MG chewable tablet Chew 1 tablet (81 mg total) by mouth daily. 04/01/20   [name], NP  atorvastatin (LIPITOR) 40 MG tablet TAKE 1 TABLET (40 MG TOTAL) BY MOUTH DAILY AT 6 PM. 01/29/20   Nafziger, Tommi Rumps, NP  Cholecalciferol (VITAMIN D-3 PO) Take 5,000 Units by mouth daily.    [provider]  Coenzyme Q10-Fish Oil-Vit E (CO-Q 10 OMEGA-3 FISH OIL PO) Take 400 mg by mouth daily.    [provider]  cyclobenzaprine (FLEXERIL) 10 MG tablet Take 1 tablet (10 mg total) by mouth 2 (two) times daily as needed for muscle spasms. 07/15/20   Gareth Morgan, MD  ELIQUIS 5 MG TABS tablet TAKE 1 TABLET BY MOUTH TWICE A DAY 01/29/20   Nafziger, Tommi Rumps, NP  fenofibrate 160 MG tablet TAKE 1 TABLET BY MOUTH EVERY DAY 05/19/20   Nafziger, Tommi Rumps, NP  ferrous gluconate (FERGON) 324 MG tablet TAKE  1 TABLET BY MOUTH DAILY WITH BREAKFAST 12/27/19   Nafziger, Tommi Rumps, NP  losartan (COZAAR) 100 MG tablet Take 1 tablet (100 mg total) by mouth daily. 05/22/20   Burnell Blanks, MD  metoprolol tartrate (LOPRESSOR) 50 MG tablet Take 1 tablet (50 mg total) by mouth 2 (two) times daily. 01/29/20 04/28/20  Nafziger, Tommi Rumps, NP  Multiple Vitamins-Minerals (WOMENS 50+ MULTI VITAMIN/MIN PO) Take 1 tablet by mouth daily. CENTRUM SILVER    [provider]  potassium chloride (KLOR-CON M10) 10 MEQ tablet Take 1 tablet (10 mEq total) by mouth 2 (two) times daily. Patient not taking: Reported on 02/25/2020 11/27/19   [name], NP  PROAIR HFA 108 [id number] Base) MCG/ACT inhaler Use 2 inhalations every 4-6 hours as needed for wheezing or coughing. Patient not taking: Reported on 02/25/2020 01/25/18   Posey Boyer, MD  terazosin (HYTRIN) 10 MG capsule TAKE 1 CAPSULE (10 MG TOTAL) BY MOUTH AT BEDTIME. 01/29/20   Nafziger, Tommi Rumps, NP    Allergies    Amlodipine besy-benazepril hcl, Hctz [hydrochlorothiazide], Diclofenac sodium, Hydralazine, and Methylprednisolone  Review of Systems   Review of Systems  Constitutional: Negative for fever.  HENT: Negative for sore throat.   Eyes: Negative for visual disturbance.  Respiratory: Negative for cough and shortness of breath.   Cardiovascular: Positive for chest pain.  Gastrointestinal: Negative for abdominal pain, nausea and vomiting.  Genitourinary: Negative for difficulty urinating.  Musculoskeletal: Positive for arthralgias, myalgias and neck pain. Negative for back pain.  Skin: Negative for rash.  Neurological: Negative for syncope and headaches.    Physical Exam Updated Vital Signs BP (!) 159/91   Pulse 73   Temp 98 F (36.7 C) (Oral)   Resp 13   Ht 5' (1.524 m)   Wt 77.1 kg   SpO2 98%   BMI 33.20 kg/m   Physical Exam Vitals and nursing note reviewed.  Constitutional:      General: She is not in acute distress.    Appearance: She is  well-developed. She is not diaphoretic.  HENT:     Head: Normocephalic and atraumatic.  Eyes:     Conjunctiva/sclera: Conjunctivae normal.  Cardiovascular:     Rate and Rhythm: Normal rate and regular rhythm.     Heart sounds: Normal heart sounds. No murmur heard.  No friction rub. No gallop.   Pulmonary:     Effort: Pulmonary effort is normal. No respiratory distress.     Breath sounds: Normal breath sounds. No wheezing or rales.  Abdominal:     General: There is no distension.     Palpations: Abdomen is soft.     Tenderness: There is no abdominal tenderness. There is  no guarding.  Musculoskeletal:        General: No tenderness.     Cervical back: Normal range of motion.  Skin:    General: Skin is warm and dry.     Findings: No erythema or rash.  Neurological:     Mental Status: She is alert and oriented to person, place, and time.     ED Results / Procedures / Treatments   Labs (all labs ordered are listed, but only abnormal results are displayed) Labs Reviewed  BASIC METABOLIC PANEL - Abnormal; Notable for the following components:      Result Value   Sodium 133 (*)    Potassium 3.4 (*)    Glucose, Bld 147 (*)    All other components within normal limits  CBC  TROPONIN I (HIGH SENSITIVITY)  TROPONIN I (HIGH SENSITIVITY)    EKG EKG Interpretation  Date/Time:  Tuesday July 15 2020 08:01:01 EST Ventricular Rate:  86 PR Interval:    QRS Duration: 158 QT Interval:  414 QTC Calculation: 495 R Axis:   122 Text Interpretation: Atrial fibrillation Right axis deviation Left bundle branch block Abnormal ECG No previous ECGs available Confirmed by Gareth Morgan [id number]) on 07/15/2020 8:48:33 AM   Radiology DG Chest 2 View  Result Date: 07/15/2020 CLINICAL DATA:  LEFT-sided chest pain. Radiation therapy 2 LEFT lower lobe lung mass 03/13/2020. EXAM: CHEST - 2 VIEW COMPARISON:  PET-CT 01/16/2020, chest radiograph 12/11/2019 FINDINGS: Stable cardiac silhouette. Low  lung volumes. Mild interstitial pattern similar comparison exam. No pneumothorax. No focal consolidation. LEFT lower lobe mass not appreciated. IMPRESSION: Mild interstitial pattern similar to comparison exam. Low lung volumes. LEFT lower lobe mass not appreciated. Electronically Signed   By: Suzy Bouchard M.D.   On: 07/15/2020 08:41    Procedures Procedures (including critical care time)  Medications Ordered in ED Medications  cyclobenzaprine (FLEXERIL) tablet 10 mg (10 mg Oral Given 07/15/20 1153)  potassium chloride SA (KLOR-CON) CR tablet 20 mEq (20 mEq Oral Given 07/15/20 1154)    ED Course  I have reviewed the triage vital signs and the nursing notes.  Pertinent labs & imaging results that were available during my care of the patient were reviewed by me and considered in my medical decision making (see chart for details).    MDM Rules/Calculators/A&P                          86 year old female with a history of aortic stenosis status post TAVR in July 2020, coronary artery disease, COPD, diabetes, hypertension, hyperlipidemia, atrial fibrillation on Eliquis, pulmonary nodule for which she received radiation, presents with concern for left shoulder, neck, chest and arm pain.  EKG shows a left bundle branch block with no prior for comparison.  CBC, CMP shows no clinically significant abnormalities.  Troponins negative x 2.  Have low suspicion for aortic dissection given equal bilateral upper and lower extremity pulses, description of pain, x-ray without concern, and significant tenderness on exam suggesting more likely musculoskeletal etiology.  Possible cervical radiculopathy resulting in left-sided neck, shoulder and arm pain.  No signs of surgical emergency. No trauma, doubt fx.  Suspect likely MSK etiology of pain given pain with palpation and movements.  Recommend flexeril, supportive care, close PCP follow up. Patient discharged in stable condition with understanding of reasons to  return.    Final Clinical Impression(s) / ED Diagnoses Final diagnoses:  Chest pain, unspecified type  Acute pain of left  shoulder  Cervical radiculopathy  Strain of neck muscle, initial encounter    Rx / DC Orders ED Discharge Orders         Ordered    cyclobenzaprine (FLEXERIL) 10 MG tablet  2 times daily PRN        07/15/20 1143           Gareth Morgan, MD 07/15/20 1204

## 2020-07-20 ENCOUNTER — Other Ambulatory Visit: Payer: Self-pay | Admitting: Adult Health

## 2020-07-20 ENCOUNTER — Other Ambulatory Visit: Payer: Self-pay | Admitting: Cardiovascular Disease

## 2020-08-04 ENCOUNTER — Encounter: Payer: Self-pay | Admitting: Emergency Medicine

## 2020-08-04 ENCOUNTER — Ambulatory Visit (INDEPENDENT_AMBULATORY_CARE_PROVIDER_SITE_OTHER): Payer: Medicare Other | Admitting: Emergency Medicine

## 2020-08-04 ENCOUNTER — Other Ambulatory Visit: Payer: Self-pay

## 2020-08-04 DIAGNOSIS — R911 Solitary pulmonary nodule: Secondary | ICD-10-CM | POA: Diagnosis not present

## 2020-08-04 DIAGNOSIS — I6523 Occlusion and stenosis of bilateral carotid arteries: Secondary | ICD-10-CM | POA: Diagnosis not present

## 2020-08-04 NOTE — Addendum Note (Signed)
Addended by: Gavin Potters R on: 08/04/2020 12:03 PM   Modules accepted: Orders

## 2020-08-04 NOTE — Patient Instructions (Addendum)
We will plan to repeat your CT chest to follow your pulmonary nodules.  Follow-up with Dr. Lamonte Sakai next available after your CT so that we can review the results together.

## 2020-08-04 NOTE — Progress Notes (Signed)
 Subjective:    Patient ID: Tara Fields, female    DOB: 02/06/1928, 86 y.o.   MRN: [id number]  HPI 89-year-old woman with a history of former tobacco use (70 pack years), associated COPD (no PFTs, no BD), A Fib, CAD, aortic stenosis for which she underwent TAVR July 2020 and is on Eliquis, hypertension, diabetes.   She is referred today for abnormal CT scan of the chest.  She originally had a chest x-ray 11/24/2019 that showed a hazy left upper lobe density, still present on repeat chest x-ray 4/27 so a CT chest was done on 12/28/2019.  I have reviewed all the films.  The CT shows an isolated spiculated superior segmental left lower lobe nodule 2.4 x 1.8 cm. Also noted was a 4 mm right lower lobe nodule, emphysematous change and a left adrenal mass 2.5 x 1.8 cm.  Her son is here and assists with the history She denies any cough, dyspnea, does have severe arthritis.   ROV 08/04/20 --follow-up visit for 86-year-old former smoker (70 pack years) with associated COPD (no PFTs), atrial fibrillation, CAD, aortic stenosis with TAVR on Eliquis, hypertension, diabetes.  I met her in May following a CT chest that showed a 2.4 cm spiculated left lower lobe nodule, left adrenal mass.  A PET scan done 01/16/2020 reviewed by me, showed that the adrenal adenoma was benign but that the left lower lobe lesion was hypermetabolic.  There was no other distant disease identified. I referred her for SBRT with Dr Kinard. She feels well, denies any breathing difficulty. She had some L CP in November that prompted an ED visit, CXR as below.  She tolerated XRT without difficulty.  She has not followed back up with Dr. Kinard yet  CXR 07/15/20 reviewed by me, showed a mild interstitial pattern, stable from prior, low lung volumes, no evidence of the left lower lobe mass.   Review of Systems As per HPi   Past Medical History:  Diagnosis Date  . Aortic stenosis    s/p TAVR 26 mm Edwards Sapien 3 THV July 2020  . CAD  (coronary artery disease)   . COPD (chronic obstructive pulmonary disease) ([facility])   . Diabetes mellitus ([facility])   . Diverticulitis   . GERD (gastroesophageal reflux disease)   . HTN (hypertension)   . Hyperlipidemia   . Legally blind in left eye, as defined in [place]   . Tobacco abuse      Family History  Problem Relation Age of Onset  . Diabetes Mother   . Stroke Brother   . Cerebral palsy Brother   . Coronary artery disease Neg Hx      Social History   Socioeconomic History  . Marital status: Widowed    Spouse name: Not on file  . Number of children: 2  . Years of education: Not on file  . Highest education level: Not on file  Occupational History  . Occupation: Retired  Tobacco Use  . Smoking status: Former Smoker    Packs/day: 1.00    Years: 70.00    Pack years: 70.00    Types: Cigarettes    Quit date: 12/15/2019    Years since quitting: 0.6  . Smokeless tobacco: Never Used  . Tobacco comment: Pt states quiting a year ago. 01/03/20 ARJ  Substance and Sexual Activity  . Alcohol use: No    Alcohol/week: 0.0 standard drinks  . Drug use: No  . Sexual activity: Not on file  Other Topics Concern  .   Not on file  Social History Narrative   Widowed- was married for 96 years    Has two sons       Social Determinants of Radio broadcast assistant Strain: Not on file  Food Insecurity: Not on file  Transportation Needs: Not on file  Physical Activity: Not on file  Stress: Not on file  Social Connections: Not on file  Intimate Partner Violence: Not on file     Allergies  Allergen Reactions  . Amlodipine Besy-Benazepril Hcl Other (See Comments)    cough  . Hctz [Hydrochlorothiazide] Other (See Comments)  . Diclofenac Sodium Diarrhea  . Hydralazine     (Apresoline *ANTIHYPERTENSIVES*) HEPATITIS  . Methylprednisolone     (Medrol (Pak) *CORTICOSTEROIDS*) STATES ANGER, BUT ALOT OF ENERGY     Outpatient Medications Prior to Visit  Medication Sig Dispense Refill  .  acetaminophen (TYLENOL) 325 MG tablet Take 650 mg by mouth every 6 (six) hours as needed (SEVERE PAIN).    [name] aspirin 81 MG chewable tablet Chew 1 tablet (81 mg total) by mouth daily. 90 tablet 3  . atorvastatin (LIPITOR) 40 MG tablet TAKE 1 TABLET (40 MG TOTAL) BY MOUTH DAILY AT 6 PM. 90 tablet 0  . Cholecalciferol (VITAMIN D-3 PO) Take 5,000 Units by mouth daily.    . Coenzyme Q10-Fish Oil-Vit E (CO-Q 10 OMEGA-3 FISH OIL PO) Take 400 mg by mouth daily.    . cyclobenzaprine (FLEXERIL) 10 MG tablet Take 1 tablet (10 mg total) by mouth 2 (two) times daily as needed for muscle spasms. 20 tablet 0  . ELIQUIS 5 MG TABS tablet TAKE 1 TABLET BY MOUTH TWICE A DAY 180 tablet 0  . fenofibrate 160 MG tablet TAKE 1 TABLET BY MOUTH EVERY DAY 90 tablet 1  . ferrous gluconate (FERGON) 324 MG tablet TAKE 1 TABLET BY MOUTH EVERY DAY WITH BREAKFAST 90 tablet 0  . losartan (COZAAR) 100 MG tablet TAKE 1 TABLET BY MOUTH EVERY DAY 90 tablet 0  . Multiple Vitamins-Minerals (WOMENS 50+ MULTI VITAMIN/MIN PO) Take 1 tablet by mouth daily. CENTRUM SILVER    . potassium chloride (KLOR-CON M10) 10 MEQ tablet Take 1 tablet (10 mEq total) by mouth 2 (two) times daily. 60 tablet 0  . PROAIR HFA 108 (90 Base) MCG/ACT inhaler Use 2 inhalations every 4-6 hours as needed for wheezing or coughing. 1 Inhaler 3  . terazosin (HYTRIN) 10 MG capsule TAKE 1 CAPSULE (10 MG TOTAL) BY MOUTH AT BEDTIME. 90 capsule 0  . amLODipine (NORVASC) 5 MG tablet Take 1 tablet (5 mg total) by mouth daily. 90 tablet 3  . metoprolol tartrate (LOPRESSOR) 50 MG tablet Take 1 tablet (50 mg total) by mouth 2 (two) times daily. 180 tablet 1   No facility-administered medications prior to visit.        Objective:   Physical Exam  Vitals:   08/04/20 1115  BP: 124/76  Pulse: 83  Temp: (!) 97.3 F (36.3 C)  SpO2: 96%  Weight: 155 lb 6.4 oz (70.5 kg)  Height: 5' (1.524 m)   Gen: Pleasant, elderly woman,  well-nourished, in no distress,  In  wheelchair  ENT: No lesions,  mouth clear,  oropharynx clear, no postnasal drip  Neck: No JVD, no stridor  Lungs: distant, no wheeze or crackles.   Cardiovascular: irreg irreg, 2/6 S murmur  Musculoskeletal: No deformities, no cyanosis or clubbing  Neuro: alert, awake, oriented  Skin: Warm, no lesions or rash  Assessment & Plan:  Pulmonary nodule 1 cm or greater in diameter Treated with radiation therapy.  She tolerated.  She has not followed up with radiation oncology yet.  No serial imaging.  Its been 6 months since her last imaging and we need to follow a 5 mm right lower lobe nodule as well.  We will repeat her CT now to look for response to therapy and to follow the 5 mm nodule.  Follow-up after completed to review together.  Baltazar Apo, MD, PhD 08/04/2020, 11:48 AM [facility] [phone] or if no answer [phone]

## 2020-08-04 NOTE — Assessment & Plan Note (Signed)
Treated with radiation therapy.  She tolerated.  She has not followed up with radiation oncology yet.  No serial imaging.  Its been 6 months since her last imaging and we need to follow a 5 mm right lower lobe nodule as well.  We will repeat her CT now to look for response to therapy and to follow the 5 mm nodule.  Follow-up after completed to review together.

## 2020-08-12 ENCOUNTER — Other Ambulatory Visit: Payer: Self-pay

## 2020-08-12 ENCOUNTER — Ambulatory Visit (INDEPENDENT_AMBULATORY_CARE_PROVIDER_SITE_OTHER): Payer: Medicare Other | Admitting: Adult Health

## 2020-08-12 ENCOUNTER — Encounter: Payer: Self-pay | Admitting: Adult Health

## 2020-08-12 VITALS — BP 140/80 | HR 80 | Temp 98.0°F | Ht 60.0 in | Wt 153.2 lb

## 2020-08-12 DIAGNOSIS — M545 Low back pain, unspecified: Secondary | ICD-10-CM | POA: Diagnosis not present

## 2020-08-12 DIAGNOSIS — I6523 Occlusion and stenosis of bilateral carotid arteries: Secondary | ICD-10-CM | POA: Diagnosis not present

## 2020-08-12 NOTE — Progress Notes (Signed)
Subjective:    Patient ID: Tara Fields, female    DOB: Jan 07, 1928, 86 y.o.   MRN: [id number]  HPI  86 year old female who  has a past medical history of Aortic stenosis, CAD (coronary artery disease), COPD (chronic obstructive pulmonary disease) ([place]), Diabetes mellitus ([place]), Diverticulitis, GERD (gastroesophageal reflux disease), HTN (hypertension), Hyperlipidemia, Legally blind in left eye, as defined in [place], and Tobacco abuse.   She presents to the office today with her son for an acute issue of low back pain.  She reports that she was reaching over to grab something off the floor a few days ago and experience bilateral low back pain.  Currently not any pain but will have pain intermittently throughout the day depending on position.  She has been using heat and ice as well is taking Tylenol.  Was seen for something similar in the emergency room on 07/15/2020 and prescribed Flexeril which made her confused so she stopped taking this medication   Review of Systems See HPI   Past Medical History:  Diagnosis Date  . Aortic stenosis    s/p TAVR 26 mm Edwards Sapien 3 THV July 2020  . CAD (coronary artery disease)   . COPD (chronic obstructive pulmonary disease) ([place])   . Diabetes mellitus ([place])   . Diverticulitis   . GERD (gastroesophageal reflux disease)   . HTN (hypertension)   . Hyperlipidemia   . Legally blind in left eye, as defined in [place]   . Tobacco abuse     Social History   Socioeconomic History  . Marital status: Widowed    Spouse name: Not on file  . Number of children: 2  . Years of education: Not on file  . Highest education level: Not on file  Occupational History  . Occupation: Retired  Tobacco Use  . Smoking status: Former Smoker    Packs/day: 1.00    Years: 70.00    Pack years: 70.00    Types: Cigarettes    Quit date: 12/15/2019    Years since quitting: 0.6  . Smokeless tobacco: Never Used  . Tobacco comment: Pt states quiting a year ago.  01/03/20 ARJ  Substance and Sexual Activity  . Alcohol use: No    Alcohol/week: 0.0 standard drinks  . Drug use: No  . Sexual activity: Not on file  Other Topics Concern  . Not on file  Social History Narrative   Widowed- was married for 34 years    Has two sons       Social Determinants of Radio broadcast assistant Strain: Not on file  Food Insecurity: Not on file  Transportation Needs: Not on file  Physical Activity: Not on file  Stress: Not on file  Social Connections: Not on file  Intimate Partner Violence: Not on file    Past Surgical History:  Procedure Laterality Date  . ABDOMINAL HYSTERECTOMY    . APPENDECTOMY    . BACK SURGERY    . CATARACT EXTRACTION, BILATERAL    . CHOLECYSTECTOMY      Family History  Problem Relation Age of Onset  . Diabetes Mother   . Stroke Brother   . Cerebral palsy Brother   . Coronary artery disease Neg Hx     Allergies  Allergen Reactions  . Amlodipine Besy-Benazepril Hcl Other (See Comments)    cough  . Hctz [Hydrochlorothiazide] Other (See Comments)  . Diclofenac Sodium Diarrhea  . Hydralazine     (Apresoline *ANTIHYPERTENSIVES*) HEPATITIS  . Methylprednisolone     (  Medrol (Pak) *CORTICOSTEROIDS*) STATES ANGER, BUT ALOT OF ENERGY    Current Outpatient Medications on File Prior to Visit  Medication Sig Dispense Refill  . acetaminophen (TYLENOL) 325 MG tablet Take 650 mg by mouth every 6 (six) hours as needed (SEVERE PAIN).    [name] aspirin 81 MG chewable tablet Chew 1 tablet (81 mg total) by mouth daily. 90 tablet 3  . atorvastatin (LIPITOR) 40 MG tablet TAKE 1 TABLET (40 MG TOTAL) BY MOUTH DAILY AT 6 PM. 90 tablet 0  . Cholecalciferol (VITAMIN D-3 PO) Take 5,000 Units by mouth daily.    . Coenzyme Q10-Fish Oil-Vit E (CO-Q 10 OMEGA-3 FISH OIL PO) Take 400 mg by mouth daily.    [name] ELIQUIS 5 MG TABS tablet TAKE 1 TABLET BY MOUTH TWICE A DAY 180 tablet 0  . fenofibrate 160 MG tablet TAKE 1 TABLET BY MOUTH EVERY DAY 90 tablet 1   . ferrous gluconate (FERGON) 324 MG tablet TAKE 1 TABLET BY MOUTH EVERY DAY WITH BREAKFAST 90 tablet 0  . losartan (COZAAR) 100 MG tablet TAKE 1 TABLET BY MOUTH EVERY DAY 90 tablet 0  . Multiple Vitamins-Minerals (WOMENS 50+ MULTI VITAMIN/MIN PO) Take 1 tablet by mouth daily. CENTRUM SILVER    . potassium chloride (KLOR-CON M10) 10 MEQ tablet Take 1 tablet (10 mEq total) by mouth 2 (two) times daily. 60 tablet 0  . PROAIR HFA 108 (90 Base) MCG/ACT inhaler Use 2 inhalations every 4-6 hours as needed for wheezing or coughing. 1 Inhaler 3  . terazosin (HYTRIN) 10 MG capsule TAKE 1 CAPSULE (10 MG TOTAL) BY MOUTH AT BEDTIME. 90 capsule 0  . amLODipine (NORVASC) 5 MG tablet Take 1 tablet (5 mg total) by mouth daily. 90 tablet 3  . metoprolol tartrate (LOPRESSOR) 50 MG tablet Take 1 tablet (50 mg total) by mouth 2 (two) times daily. 180 tablet 1   No current facility-administered medications on file prior to visit.    BP 140/80 (BP Location: Left Arm, Patient Position: Sitting, Cuff Size: Normal)   Pulse 80   Temp 98 F (36.7 C) (Oral)   Ht 5' (1.524 m)   Wt 153 lb 3.2 oz (69.5 kg)   BMI 29.92 kg/m       Objective:   Physical Exam Vitals and nursing note reviewed.  Constitutional:      Appearance: Normal appearance.  Cardiovascular:     Rate and Rhythm: Normal rate and regular rhythm.     Pulses: Normal pulses.     Heart sounds: Normal heart sounds.  Pulmonary:     Effort: Pulmonary effort is normal.     Breath sounds: Normal breath sounds.  Musculoskeletal:        General: Tenderness (mile tenderness to bilateral sides of low back. No spine tenderness noted) present.  Skin:    General: Skin is warm and dry.  Neurological:     General: No focal deficit present.     Mental Status: She is alert and oriented to person, place, and time.  Psychiatric:        Mood and Affect: Mood normal.        Behavior: Behavior normal.        Thought Content: Thought content normal.         Judgment: Judgment normal.       Assessment & Plan:  1. Acute bilateral low back pain without sciatica -Appears to be muscular in nature.  We will have her continue with ice and heat  as well as Tylenol.  Gentle stretching exercises will help as well.  No muscle relaxers that she had adverse reaction recently.  [name], NP

## 2020-08-21 ENCOUNTER — Other Ambulatory Visit: Payer: Self-pay

## 2020-08-21 ENCOUNTER — Ambulatory Visit
Admission: RE | Admit: 2020-08-21 | Discharge: 2020-08-21 | Disposition: A | Discharge status: Home / Self Care | Payer: Medicare Other | Source: Ambulatory Visit | Attending: Emergency Medicine | Admitting: Emergency Medicine

## 2020-08-21 DIAGNOSIS — R911 Solitary pulmonary nodule: Secondary | ICD-10-CM

## 2020-08-25 NOTE — Telephone Encounter (Signed)
Please advise on patient mychart message    Hello Dr Lamonte Sakai, Need your help "translating" Dr. Felton Clinton findings. I believe he is saying that the tumor on her  left side that you treated has shrunk significantly. I don't see where he mentions the "spot" on her right side-- did it disappear?  Are there any other finding in this report that are of concerned?   [place] [phone]

## 2020-08-27 NOTE — Telephone Encounter (Signed)
I reviewed the CT chest and compared it to her prior.   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   The small R nodule wasn't mentioned in the radiology report but is still present, unchanged I size which is good news. We will continue to follow it to insure it doesn't change.   Thanks.

## 2020-08-31 IMAGING — CT CT CHEST W/ CM
1 series · 15 of 34 positions shown, 19 images · IV contrast (APPLIED)
Comparison: None.

CLINICAL DATA: Persistent right upper lobe density compatible with
a nodule on recent chest radiographs.

EXAM:
CT CHEST WITH CONTRAST
TECHNIQUE: Multidetector CT imaging of the chest was performed during
intravenous contrast administration.
CONTRAST:  75mL L92S8H-FDD IOPAMIDOL (L92S8H-FDD) INJECTION 61%

[Series 2: chest w/cm · axial · 0.73mm/px · z∈[-307,-51]mm · 15 of 152 slices shown, 19 images]
[im 12/152  mediastinal]
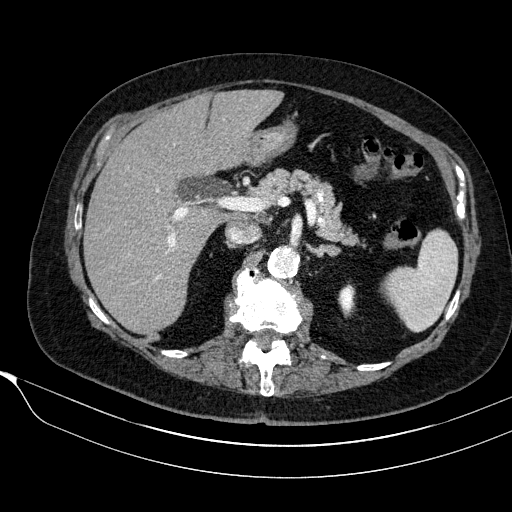
[im 12/152  lung]
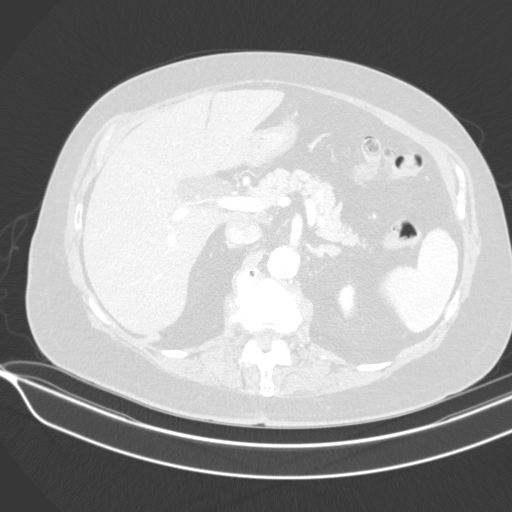
[im 23/152  lung]
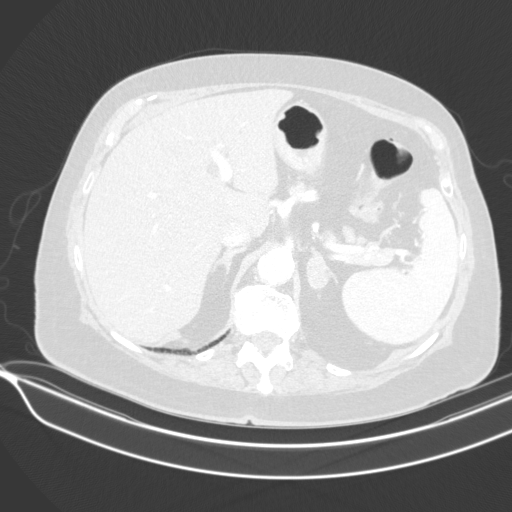
[im 31/152  lung]
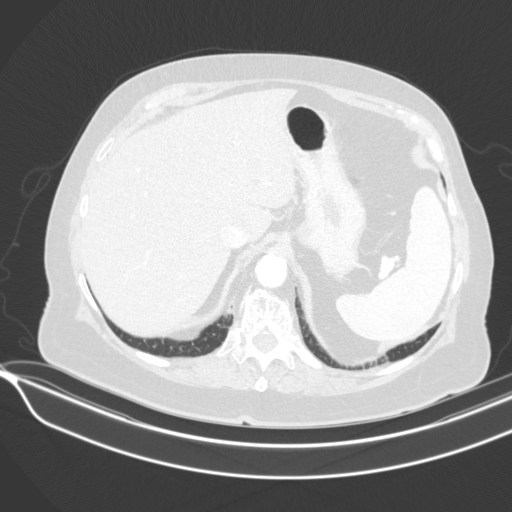
[im 40/152  lung]
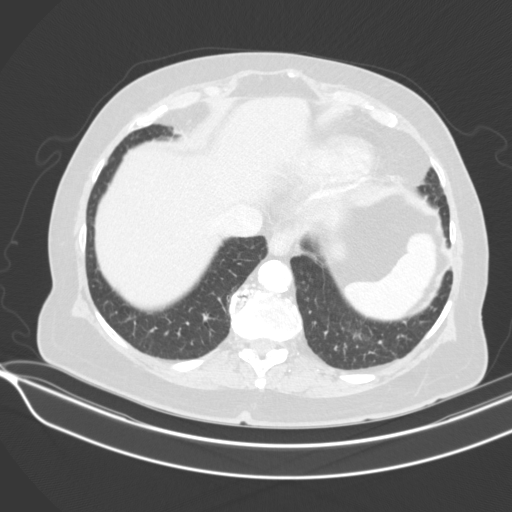
[im 51/152  mediastinal]
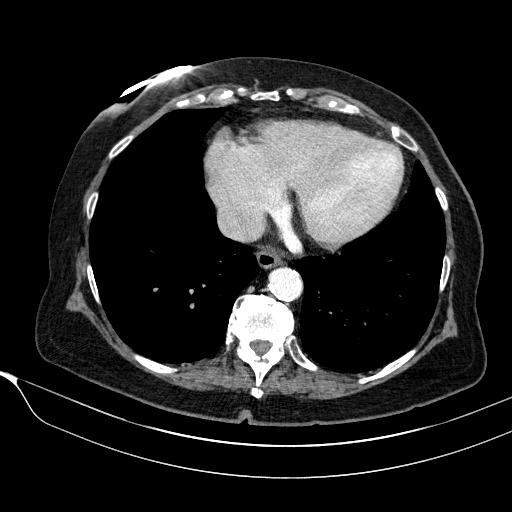
[im 51/152  lung]
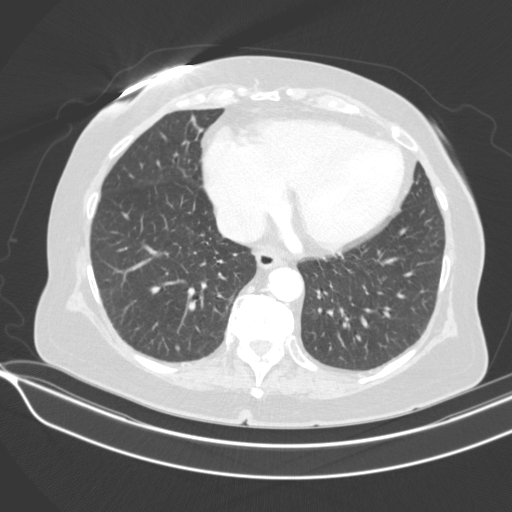
[im 61/152  lung]
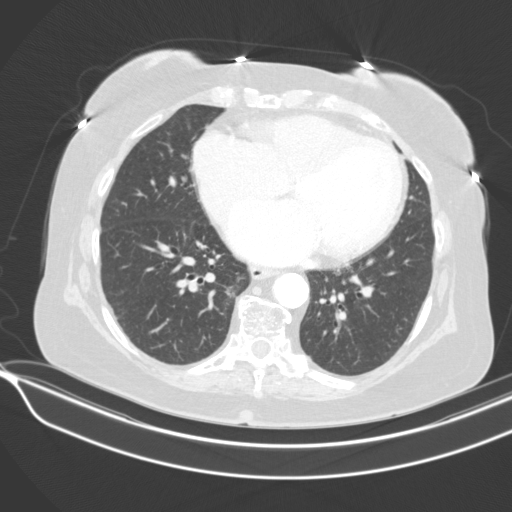
[im 68/152  lung]
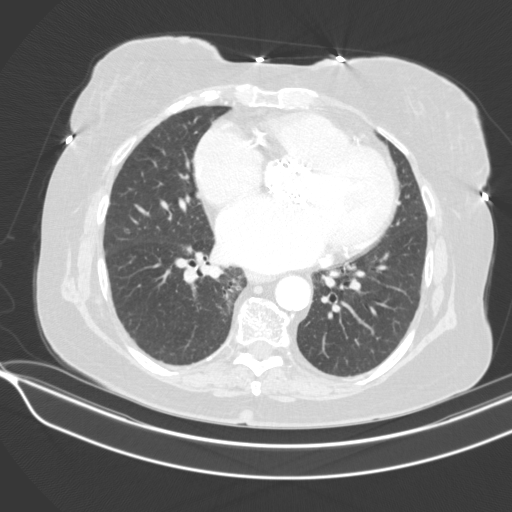
[im 79/152  lung]
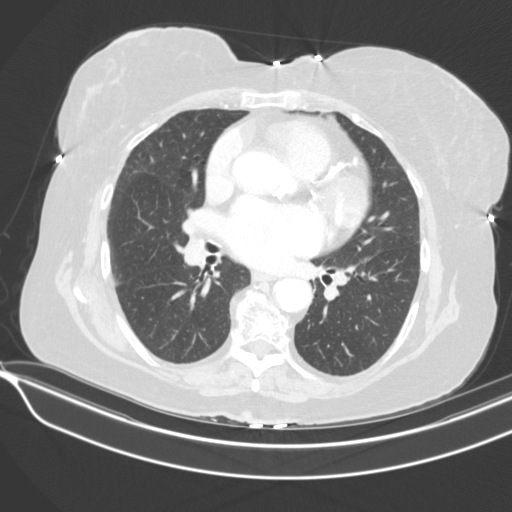
[im 84/152  mediastinal]
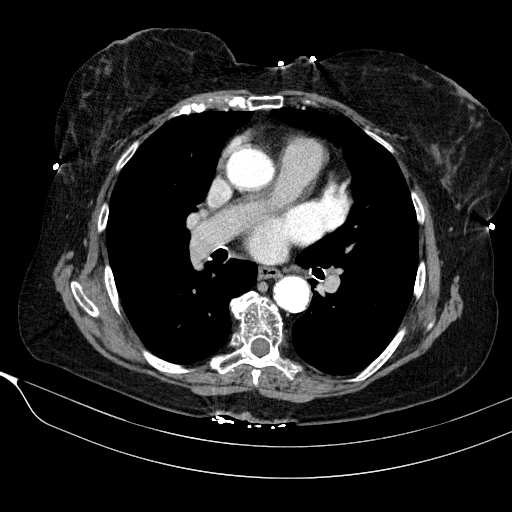
[im 84/152  lung]
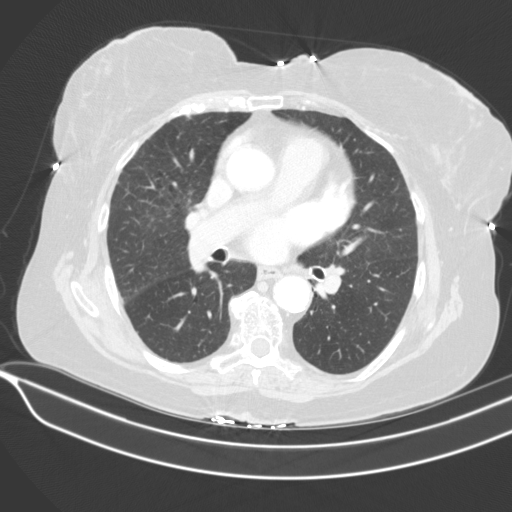
[im 91/152  lung]
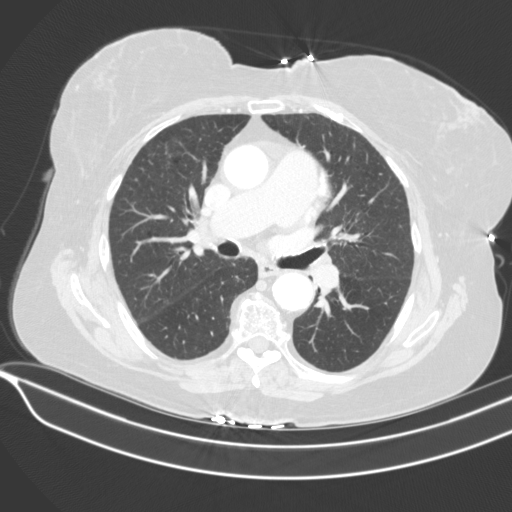
[im 101/152  lung]
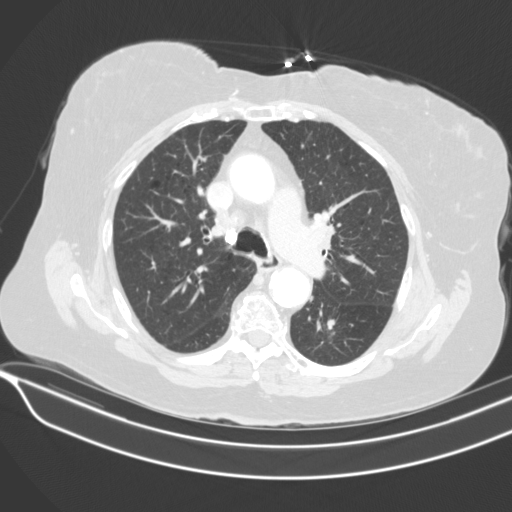
[im 112/152  lung]
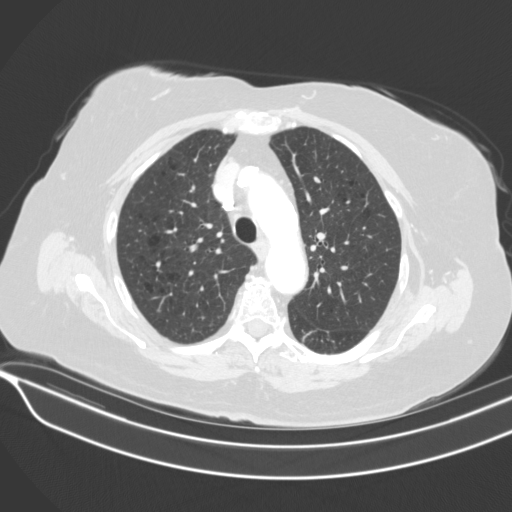
[im 121/152  mediastinal]
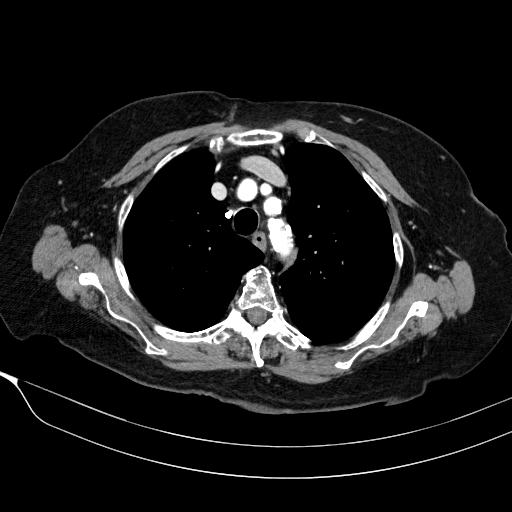
[im 121/152  lung]
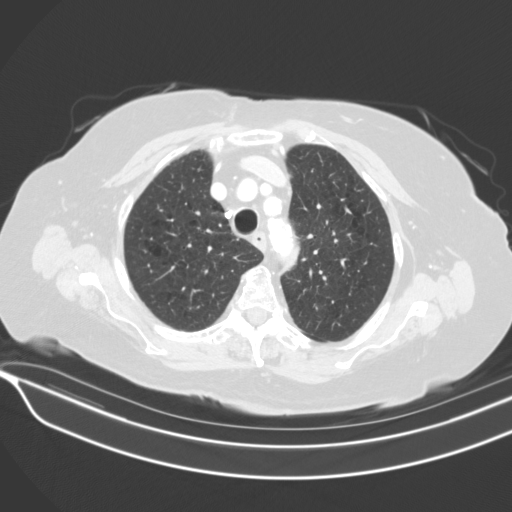
[im 129/152  lung]
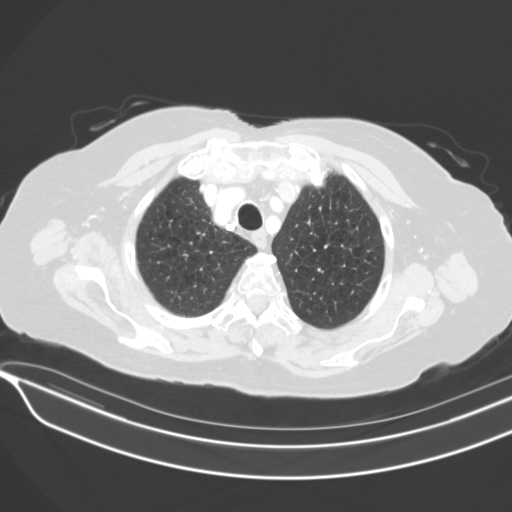
[im 140/152  lung]
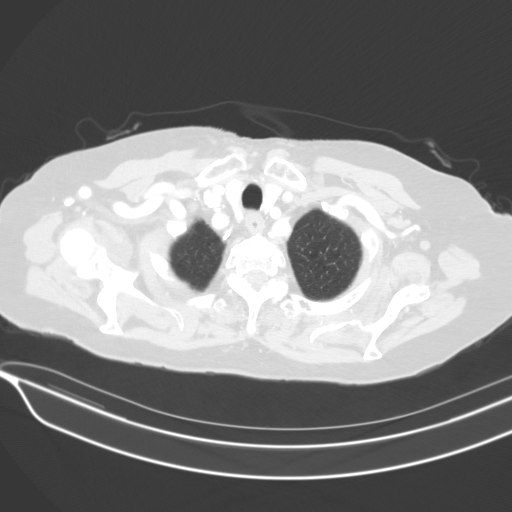

[15 of 34 positions shown; findings below may reference images not displayed]

FINDINGS: Cardiovascular: Aortic stent valve. Enlarged heart. Dense
atheromatous calcifications, including the coronary arteries and
aorta. Enlarged central pulmonary arteries. The main pulmonary
artery measures 3.5 cm in maximum diameter.

Mediastinum/Nodes: Minimally heterogeneous thyroid gland. No
enlarged lymph nodes.

Lungs/Pleura: Irregular, spiculated nodule in the superior segment
of the left lower lobe, measuring 2.4 x 1.8 cm on image number 47
series 5. This corresponds to the nodular density seen on the recent
chest radiographs.

4 mm nodule in the right lower lobe on image number 102 series 5.

Bilateral centrilobular bullous changes, most pronounced in the
upper lobes.

Upper Abdomen: Mildly heterogeneous left adrenal mass measuring
x 1.8 cm on image number 131 series 2. Cholecystectomy clips.
Dilated extrahepatic and intrahepatic ducts with the common duct
measuring 1.9 cm in maximum diameter.

Musculoskeletal: Diffuse osteopenia. Thoracic spine degenerative
changes. No evidence of bony metastatic disease.
IMPRESSION: 1. 2.4 x 1.8 cm spiculated nodule in the superior segment of the
left lower lobe, suspicious for a primary lung carcinoma. Recommend
further evaluation with a PET-CT and/or tissue sampling.
2. 4 mm indeterminate right lower lobe nodule.
3. 2.5 x 1.8 cm left adrenal mass. This could represent an adenoma
or metastasis.
4. Enlarged central pulmonary arteries, compatible with pulmonary
arterial hypertension.
5. COPD with centrilobular emphysema.
6.  Calcific coronary artery and aortic atherosclerosis.

Aortic Atherosclerosis ([facility]BHE-4OP.P) and Emphysema (UKBHE-J24.H).

## 2020-09-01 ENCOUNTER — Emergency Department (HOSPITAL_COMMUNITY): Payer: Medicare Other

## 2020-09-01 ENCOUNTER — Inpatient Hospital Stay (HOSPITAL_COMMUNITY)
Admission: EM | Admit: 2020-09-01 | Discharge: 2020-09-04 | DRG: 193 | Disposition: A | Discharge status: Home Health | Payer: Medicare Other | Attending: Internal Medicine | Admitting: Internal Medicine

## 2020-09-01 ENCOUNTER — Encounter (HOSPITAL_COMMUNITY): Payer: Self-pay | Admitting: *Deleted

## 2020-09-01 ENCOUNTER — Other Ambulatory Visit: Payer: Self-pay

## 2020-09-01 DIAGNOSIS — K429 Umbilical hernia without obstruction or gangrene: Secondary | ICD-10-CM | POA: Diagnosis present

## 2020-09-01 DIAGNOSIS — Z85118 Personal history of other malignant neoplasm of bronchus and lung: Secondary | ICD-10-CM

## 2020-09-01 DIAGNOSIS — R0602 Shortness of breath: Secondary | ICD-10-CM | POA: Diagnosis present

## 2020-09-01 DIAGNOSIS — Z90710 Acquired absence of both cervix and uterus: Secondary | ICD-10-CM

## 2020-09-01 DIAGNOSIS — J9601 Acute respiratory failure with hypoxia: Secondary | ICD-10-CM | POA: Diagnosis present

## 2020-09-01 DIAGNOSIS — I10 Essential (primary) hypertension: Secondary | ICD-10-CM | POA: Diagnosis not present

## 2020-09-01 DIAGNOSIS — Z87891 Personal history of nicotine dependence: Secondary | ICD-10-CM

## 2020-09-01 DIAGNOSIS — K5900 Constipation, unspecified: Secondary | ICD-10-CM | POA: Diagnosis present

## 2020-09-01 DIAGNOSIS — J189 Pneumonia, unspecified organism: Secondary | ICD-10-CM | POA: Diagnosis not present

## 2020-09-01 DIAGNOSIS — I4819 Other persistent atrial fibrillation: Secondary | ICD-10-CM | POA: Diagnosis present

## 2020-09-01 DIAGNOSIS — Z9049 Acquired absence of other specified parts of digestive tract: Secondary | ICD-10-CM

## 2020-09-01 DIAGNOSIS — Z952 Presence of prosthetic heart valve: Secondary | ICD-10-CM

## 2020-09-01 DIAGNOSIS — E876 Hypokalemia: Secondary | ICD-10-CM | POA: Diagnosis present

## 2020-09-01 DIAGNOSIS — H548 Legal blindness, as defined in USA: Secondary | ICD-10-CM | POA: Diagnosis present

## 2020-09-01 DIAGNOSIS — Z833 Family history of diabetes mellitus: Secondary | ICD-10-CM | POA: Diagnosis not present

## 2020-09-01 DIAGNOSIS — J441 Chronic obstructive pulmonary disease with (acute) exacerbation: Secondary | ICD-10-CM | POA: Diagnosis not present

## 2020-09-01 DIAGNOSIS — I2510 Atherosclerotic heart disease of native coronary artery without angina pectoris: Secondary | ICD-10-CM | POA: Diagnosis present

## 2020-09-01 DIAGNOSIS — E1169 Type 2 diabetes mellitus with other specified complication: Secondary | ICD-10-CM | POA: Diagnosis present

## 2020-09-01 DIAGNOSIS — E785 Hyperlipidemia, unspecified: Secondary | ICD-10-CM | POA: Diagnosis present

## 2020-09-01 DIAGNOSIS — E119 Type 2 diabetes mellitus without complications: Secondary | ICD-10-CM

## 2020-09-01 DIAGNOSIS — I447 Left bundle-branch block, unspecified: Secondary | ICD-10-CM | POA: Diagnosis present

## 2020-09-01 DIAGNOSIS — K219 Gastro-esophageal reflux disease without esophagitis: Secondary | ICD-10-CM | POA: Diagnosis present

## 2020-09-01 DIAGNOSIS — Z923 Personal history of irradiation: Secondary | ICD-10-CM

## 2020-09-01 DIAGNOSIS — J449 Chronic obstructive pulmonary disease, unspecified: Secondary | ICD-10-CM

## 2020-09-01 DIAGNOSIS — Z7901 Long term (current) use of anticoagulants: Secondary | ICD-10-CM

## 2020-09-01 DIAGNOSIS — I110 Hypertensive heart disease with heart failure: Secondary | ICD-10-CM | POA: Diagnosis present

## 2020-09-01 DIAGNOSIS — I5033 Acute on chronic diastolic (congestive) heart failure: Secondary | ICD-10-CM | POA: Diagnosis present

## 2020-09-01 DIAGNOSIS — Z888 Allergy status to other drugs, medicaments and biological substances status: Secondary | ICD-10-CM

## 2020-09-01 DIAGNOSIS — E11638 Type 2 diabetes mellitus with other oral complications: Secondary | ICD-10-CM | POA: Diagnosis not present

## 2020-09-01 DIAGNOSIS — I350 Nonrheumatic aortic (valve) stenosis: Secondary | ICD-10-CM | POA: Diagnosis present

## 2020-09-01 DIAGNOSIS — Z20822 Contact with and (suspected) exposure to covid-19: Secondary | ICD-10-CM | POA: Diagnosis present

## 2020-09-01 DIAGNOSIS — J432 Centrilobular emphysema: Secondary | ICD-10-CM | POA: Diagnosis present

## 2020-09-01 DIAGNOSIS — Z7982 Long term (current) use of aspirin: Secondary | ICD-10-CM

## 2020-09-01 DIAGNOSIS — K469 Unspecified abdominal hernia without obstruction or gangrene: Secondary | ICD-10-CM

## 2020-09-01 DIAGNOSIS — R0902 Hypoxemia: Secondary | ICD-10-CM

## 2020-09-01 DIAGNOSIS — Z79899 Other long term (current) drug therapy: Secondary | ICD-10-CM

## 2020-09-01 LAB — CBC
HCT: 37.2 % (ref 36.0–46.0)
Hemoglobin: 11.5 g/dL — ABNORMAL LOW (ref 12.0–15.0)
MCH: 28.9 pg (ref 26.0–34.0)
MCHC: 30.9 g/dL (ref 30.0–36.0)
MCV: 93.5 fL (ref 80.0–100.0)
Platelets: 236 10*3/uL (ref 150–400)
RBC: 3.98 MIL/uL (ref 3.87–5.11)
RDW: 14.8 % (ref 11.5–15.5)
WBC: 6.1 10*3/uL (ref 4.0–10.5)
nRBC: 0 % (ref 0.0–0.2)

## 2020-09-01 LAB — COMPREHENSIVE METABOLIC PANEL
ALT: 15 U/L (ref 0–44)
AST: 21 U/L (ref 15–41)
Albumin: 3.5 g/dL (ref 3.5–5.0)
Alkaline Phosphatase: 40 U/L (ref 38–126)
Anion gap: 13 (ref 5–15)
BUN: 13 mg/dL (ref 8–23)
CO2: 23 mmol/L (ref 22–32)
Calcium: 9 mg/dL (ref 8.9–10.3)
Chloride: 105 mmol/L (ref 98–111)
Creatinine, Ser: 0.82 mg/dL (ref 0.44–1.00)
GFR, Estimated: >60 mL/min (ref >60)
Glucose, Bld: 142 mg/dL — ABNORMAL HIGH (ref 70–99)
Potassium: 3.6 mmol/L (ref 3.5–5.1)
Sodium: 141 mmol/L (ref 135–145)
Total Bilirubin: 1 mg/dL (ref 0.3–1.2)
Total Protein: 6.4 g/dL — ABNORMAL LOW (ref 6.5–8.1)

## 2020-09-01 LAB — URINALYSIS, ROUTINE W REFLEX MICROSCOPIC
Bilirubin Urine: NEGATIVE
Glucose, UA: NEGATIVE mg/dL
Hgb urine dipstick: NEGATIVE
Ketones, ur: NEGATIVE mg/dL
Nitrite: NEGATIVE
Protein, ur: 30 mg/dL — AB
Specific Gravity, Urine: 1.019 (ref 1.005–1.030)
pH: 6 (ref 5.0–8.0)

## 2020-09-01 LAB — RESP PANEL BY RT-PCR (FLU A&B, COVID) ARPGX2
Influenza A by PCR: NEGATIVE
Influenza B by PCR: NEGATIVE
SARS Coronavirus 2 by RT PCR: NEGATIVE

## 2020-09-01 LAB — TROPONIN I (HIGH SENSITIVITY)
Troponin I (High Sensitivity): 9 ng/L (ref <18)
Troponin I (High Sensitivity): 9 ng/L (ref <18)

## 2020-09-01 LAB — BRAIN NATRIURETIC PEPTIDE: B Natriuretic Peptide: 1304.5 pg/mL — ABNORMAL HIGH (ref 0.0–100.0)

## 2020-09-01 LAB — LIPASE, BLOOD: Lipase: 18 U/L (ref 11–51)

## 2020-09-01 MED ORDER — SODIUM CHLORIDE 0.9 % IV SOLN
1.0000 g | Freq: Once | INTRAVENOUS | Status: AC
  Administered 2020-09-01: 1 g via INTRAVENOUS
  Filled 2020-09-01: qty 10

## 2020-09-01 MED ORDER — ASPIRIN 81 MG PO CHEW
81.0000 mg | CHEWABLE_TABLET | Freq: Every day | ORAL | Status: DC
  Administered 2020-09-02 – 2020-09-03 (×2): 81 mg via ORAL
  Filled 2020-09-01 (×3): qty 1

## 2020-09-01 MED ORDER — DOXYCYCLINE HYCLATE 100 MG PO TABS
100.0000 mg | ORAL_TABLET | Freq: Two times a day (BID) | ORAL | Status: DC
  Administered 2020-09-02 – 2020-09-03 (×4): 100 mg via ORAL
  Filled 2020-09-01 (×5): qty 1

## 2020-09-01 MED ORDER — IOHEXOL 350 MG/ML SOLN
100.0000 mL | Freq: Once | INTRAVENOUS | Status: AC | PRN
  Administered 2020-09-01: 100 mL via INTRAVENOUS

## 2020-09-01 MED ORDER — METOPROLOL TARTRATE 50 MG PO TABS
50.0000 mg | ORAL_TABLET | Freq: Two times a day (BID) | ORAL | Status: DC
  Administered 2020-09-01 – 2020-09-03 (×5): 50 mg via ORAL
  Filled 2020-09-01 (×6): qty 1

## 2020-09-01 MED ORDER — SODIUM CHLORIDE 0.9 % IV SOLN
1.0000 g | INTRAVENOUS | Status: DC
  Administered 2020-09-02: 1 g via INTRAVENOUS
  Filled 2020-09-01 (×3): qty 10

## 2020-09-01 MED ORDER — FUROSEMIDE 10 MG/ML IJ SOLN
40.0000 mg | Freq: Once | INTRAMUSCULAR | Status: AC
  Administered 2020-09-01: 40 mg via INTRAVENOUS
  Filled 2020-09-01: qty 4

## 2020-09-01 MED ORDER — TRAMADOL HCL 50 MG PO TABS
50.0000 mg | ORAL_TABLET | Freq: Four times a day (QID) | ORAL | Status: DC | PRN
  Administered 2020-09-01: 50 mg via ORAL
  Filled 2020-09-01 (×3): qty 1

## 2020-09-01 MED ORDER — LOSARTAN POTASSIUM 50 MG PO TABS
100.0000 mg | ORAL_TABLET | Freq: Every day | ORAL | Status: DC
  Administered 2020-09-02 – 2020-09-03 (×2): 100 mg via ORAL
  Filled 2020-09-01 (×3): qty 2

## 2020-09-01 MED ORDER — SODIUM CHLORIDE 0.9 % IV SOLN: 500.0000 mg | INTRAVENOUS | Status: DC

## 2020-09-01 MED ORDER — MORPHINE SULFATE (PF) 2 MG/ML IV SOLN
2.0000 mg | Freq: Once | INTRAVENOUS | Status: AC
  Administered 2020-09-01: 2 mg via INTRAVENOUS
  Filled 2020-09-01: qty 1

## 2020-09-01 MED ORDER — AMLODIPINE BESYLATE 10 MG PO TABS
10.0000 mg | ORAL_TABLET | Freq: Every day | ORAL | Status: DC
  Administered 2020-09-02 – 2020-09-03 (×2): 10 mg via ORAL
  Filled 2020-09-01 (×3): qty 1

## 2020-09-01 MED ORDER — ATORVASTATIN CALCIUM 40 MG PO TABS
40.0000 mg | ORAL_TABLET | Freq: Every day | ORAL | Status: DC
  Administered 2020-09-01: 40 mg via ORAL
  Filled 2020-09-01: qty 1

## 2020-09-01 MED ORDER — APIXABAN 5 MG PO TABS
5.0000 mg | ORAL_TABLET | Freq: Two times a day (BID) | ORAL | Status: DC
  Administered 2020-09-01 – 2020-09-03 (×5): 5 mg via ORAL
  Filled 2020-09-01 (×6): qty 1

## 2020-09-01 MED ORDER — SODIUM CHLORIDE 0.9 % IV SOLN
500.0000 mg | Freq: Once | INTRAVENOUS | Status: DC
  Filled 2020-09-01: qty 500

## 2020-09-01 NOTE — ED Notes (Signed)
Taken to CT.

## 2020-09-01 NOTE — Progress Notes (Signed)
Arrived to 6n23 from ED at this time. Moved self from stretcher to bed without difficulty. Pt states that she lives in an assisted living facility and uses walker when ambulating. Denies nausea, c/o abd pain 5/10.

## 2020-09-01 NOTE — ED Notes (Signed)
Patient had became very anxious and started hyperventalting, she was yelling " I CANT BREATHE."  Patient placed on NRB at 15L, patient has tachypneic. Prior to this incident patient had been yelling out in pain. Per son she can become this way when she is in a lot of pain.   Patient now medicated for pain, after this RN requested it. Taken off NRB and placed on 2L [place] for comfort.   Purewick also in place.

## 2020-09-01 NOTE — ED Triage Notes (Signed)
Pt arrived by ems from facility. Reports having epigastric pain that occurs with breathing. Has cardiac hx. ekg done on arrival. Reports recent cough.

## 2020-09-01 NOTE — ED Notes (Signed)
Report attempted 3x.   Patient transport here for patient  Floor aware to call for report  VSS

## 2020-09-01 NOTE — H&P (Signed)
History and Physical    Tara Fields YTK:[id number] DOB: May 14, 1928 DOA: 09/01/2020  PCP: [name], NP   Chief Complaint: Abdominal pain, shortness of breath  HPI: Tara Fields is a 87 y.o. female with medical history significant of bilateral lung nodule/masses who reports having undergone radiation earlier this month, aortic stenosis, CAD (coronary artery disease), COPD (chronic obstructive pulmonary disease) ([place]), Diabetes mellitus ([place]), Diverticulitis, GERD (gastroesophageal reflux disease), HTN (hypertension), Hyperlipidemia, Legally blind in left eye.  Patient presents with 3 to 4 days of epigastric abdominal pain pinching/squeezing in nature with associated shortness of breath.  Patient was noted to be hypoxic at intake, COVID 19 swab was negative however based on CTA of the chest a PE was ruled out but notable infection/inflammation of the left upper lobe, previous imaging notes a nodule too small for evaluation or intervention but being followed by pulmonology.  Patient also has a 2.1 cm nodule in the left lower lobe being followed by pulmonology status post radiation as above.  Given abdominal complaints patient also had CT of the abdomen showing a small Richter umbilical hernia containing a small loop of transverse colon without any ischemia bowel obstruction or inflammation.  Patient also has notable diverticulosis on imaging without diverticulitis.  Unfortunately patient's review of systems is somewhat limited and despite multiple questions in regards to alleviating or exacerbating factors patient could not recall anything specific just epigastric pain and dyspnea with exertion and shortness of breath in general.  Patient indicates that her pain and shortness of breath resolved immediately with single dose of IV antibiotics in the emergency department.  At this point given patient's hypoxia concern for possible community-acquired pneumonia given inflammatory and atypical  appearance on imaging we will admit patient for further evaluation and treatment.  Review of Systems: As per HPI above, otherwise denies nausea, vomiting, fever, chills, cough, sputum production, headache, constipation chest pain.   Assessment/Plan  Acute hypoxic respiratory failure 2/2 community acquired pneumonia, POA covid negative at intake Rule out worsening respiratory mass/inflammatory response -Given above patient will be initiated on doxycycline (azithromycin/ceftriaxone discontinued due to questionably prolonged QT on EKG) -Wean oxygen as tolerated, ambulatory oxygen screening daily -Suspect patient will be stable for discharge in 24 to 48 hours pending clinical course given minimal symptoms, imaging and hypoxia. -COVID-19 swab negative -no recent sick contacts or travel  A. fib, rate controlled on Eliquis Continue patient's home Eliquis, rate control medications  Atypical lung nodule, chronic Patient being followed with pulmonology, recent evaluation with Dr. Lamonte Sakai Patient reports recent episode of radiation to the chest for unspecified nodule/mass, per chart review this appears to have been set up but does not appear the patient has undergone any therapy yet   DVT prophylaxis: Eliquis Code Status: Full Family Communication: None available Status is: Inpatient  Dispo: The patient is from: Home              Anticipated d/c is to: Home              Anticipated d/c date is: 24 to 48 hours              Patient currently not medically stable for discharge due to ongoing hypoxia with unclear etiology requiring further evaluation and treatment  Consultants:   None  Procedures:   None   Past Medical History:  Diagnosis Date  . Aortic stenosis    s/p TAVR 26 mm Edwards Sapien 3 THV July 2020  . CAD (coronary artery disease)   .  COPD (chronic obstructive pulmonary disease) ([place])   . Diabetes mellitus ([place])   . Diverticulitis   . GERD (gastroesophageal reflux disease)    . HTN (hypertension)   . Hyperlipidemia   . Legally blind in left eye, as defined in [place]   . Tobacco abuse     Past Surgical History:  Procedure Laterality Date  . ABDOMINAL HYSTERECTOMY    . APPENDECTOMY    . BACK SURGERY    . CATARACT EXTRACTION, BILATERAL    . CHOLECYSTECTOMY       reports that she quit smoking about 8 months ago. Her smoking use included cigarettes. She has a 70.00 pack-year smoking history. She has never used smokeless tobacco. She reports that she does not drink alcohol and does not use drugs.  Allergies  Allergen Reactions  . Amlodipine Besy-Benazepril Hcl Other (See Comments)    cough  . Hctz [Hydrochlorothiazide] Other (See Comments)  . Diclofenac Sodium Diarrhea  . Hydralazine     (Apresoline *ANTIHYPERTENSIVES*) HEPATITIS  . Methylprednisolone     (Medrol (Pak) *CORTICOSTEROIDS*) STATES ANGER, BUT ALOT OF ENERGY    Family History  Problem Relation Age of Onset  . Diabetes Mother   . Stroke Brother   . Cerebral palsy Brother   . Coronary artery disease Neg Hx     Prior to Admission medications   Medication Sig Start Date End Date Taking? Authorizing Provider  acetaminophen (TYLENOL) 325 MG tablet Take 650 mg by mouth every 6 (six) hours as needed (SEVERE PAIN).   Yes [provider]  amLODipine (NORVASC) 10 MG tablet Take 10 mg by mouth daily.   Yes [provider]  aspirin 81 MG chewable tablet Chew 1 tablet (81 mg total) by mouth daily. 04/01/20  Yes Nafziger, Tommi Rumps, NP  atorvastatin (LIPITOR) 40 MG tablet TAKE 1 TABLET (40 MG TOTAL) BY MOUTH DAILY AT 6 PM. 07/22/20  Yes Nafziger, Tommi Rumps, NP  Cholecalciferol (VITAMIN D-3 PO) Take 5,000 Units by mouth 2 (two) times daily.   Yes [provider]  Coenzyme Q10-Fish Oil-Vit E (CO-Q 10 OMEGA-3 FISH OIL PO) Take 400 mg by mouth daily.   Yes [provider]  ELIQUIS 5 MG TABS tablet TAKE 1 TABLET BY MOUTH TWICE A DAY Patient taking differently: Take 5 mg by mouth 2  (two) times daily. 07/22/20  Yes Nafziger, Tommi Rumps, NP  fenofibrate 160 MG tablet TAKE 1 TABLET BY MOUTH EVERY DAY Patient taking differently: Take 160 mg by mouth every evening. 05/19/20  Yes Nafziger, Tommi Rumps, NP  ferrous gluconate (FERGON) 324 MG tablet TAKE 1 TABLET BY MOUTH EVERY DAY WITH BREAKFAST Patient taking differently: Take 324 mg by mouth daily with breakfast. 07/22/20  Yes Nafziger, Tommi Rumps, NP  losartan (COZAAR) 100 MG tablet TAKE 1 TABLET BY MOUTH EVERY DAY Patient taking differently: Take 100 mg by mouth daily. 07/22/20  Yes Burnell Blanks, MD  metoprolol tartrate (LOPRESSOR) 50 MG tablet Take 1 tablet (50 mg total) by mouth 2 (two) times daily. 01/29/20 04/28/20 Yes Nafziger, Tommi Rumps, NP  Multiple Vitamins-Minerals (WOMENS 50+ MULTI VITAMIN/MIN PO) Take 1 tablet by mouth daily. CENTRUM SILVER   Yes [provider]  PROAIR HFA 108 (90 Base) MCG/ACT inhaler Use 2 inhalations every 4-6 hours as needed for wheezing or coughing. 01/25/18  Yes Posey Boyer, MD  terazosin (HYTRIN) 10 MG capsule TAKE 1 CAPSULE (10 MG TOTAL) BY MOUTH AT BEDTIME. Patient taking differently: Take 10 mg by mouth daily at 12 noon. 07/22/20  Yes Nafziger, Tommi Rumps, NP  traMADol (ULTRAM) 50 MG tablet Take 50 mg by mouth 3 (three) times daily as needed for pain. 08/20/20  Yes [provider]  potassium chloride (KLOR-CON M10) 10 MEQ tablet Take 1 tablet (10 mEq total) by mouth 2 (two) times daily. Patient not taking: Reported on 09/01/2020 11/27/19   [name], NP    Physical Exam: Vitals:   09/01/20 1500 09/01/20 1545 09/01/20 1600 09/01/20 1614  BP:    (!) 150/85  Pulse: 100 (!) 102 (!) 106 98  Resp:    16  Temp:      TempSrc:      SpO2: 94% (!) 87% (!) 86% 94%  Weight:      Height:        Constitutional: NAD, calm, comfortable Vitals:   09/01/20 1500 09/01/20 1545 09/01/20 1600 09/01/20 1614  BP:    (!) 150/85  Pulse: 100 (!) 102 (!) 106 98  Resp:    16  Temp:      TempSrc:       SpO2: 94% (!) 87% (!) 86% 94%  Weight:      Height:       General:  Pleasantly resting in bed, No acute distress. HEENT:  Normocephalic atraumatic.  Sclerae nonicteric, noninjected.  Extraocular movements intact bilaterally. Neck:  Without mass or deformity.  Trachea is midline. Lungs:  Clear to auscultate bilaterally without rhonchi, wheeze, or rales. Heart:  Regular rate and rhythm.  Without murmurs, rubs, or gallops. Abdomen:  Soft, nontender, nondistended.  Without guarding or rebound. Extremities: Without cyanosis, clubbing, edema, or obvious deformity. Vascular:  Dorsalis pedis and posterior tibial pulses palpable bilaterally. Skin:  Warm and dry, no erythema, no ulcerations.  Labs on Admission: I have personally reviewed following labs and imaging studies  CBC: Recent Labs  Lab 09/01/20 1132  WBC 6.1  HGB 11.5*  HCT 37.2  MCV 93.5  PLT 606   Basic Metabolic Panel: Recent Labs  Lab 09/01/20 1132  NA 141  K 3.6  CL 105  CO2 23  GLUCOSE 142*  BUN 13  CREATININE 0.82  CALCIUM 9.0   GFR: Estimated Creatinine Clearance: 40.4 mL/min (by C-G formula based on SCr of 0.82 mg/dL). Liver Function Tests: Recent Labs  Lab 09/01/20 1132  AST 21  ALT 15  ALKPHOS 40  BILITOT 1.0  PROT 6.4*  ALBUMIN 3.5   Recent Labs  Lab 09/01/20 1132  LIPASE 18   No results for input(s): AMMONIA in the last 168 hours. Coagulation Profile: No results for input(s): INR, PROTIME in the last 168 hours. Cardiac Enzymes: No results for input(s): CKTOTAL, CKMB, CKMBINDEX, TROPONINI in the last 168 hours. BNP (last 3 results) Recent Labs    11/23/19 1432  PROBNP 519.0*   HbA1C: No results for input(s): HGBA1C in the last 72 hours. CBG: No results for input(s): GLUCAP in the last 168 hours. Lipid Profile: No results for input(s): CHOL, HDL, LDLCALC, TRIG, CHOLHDL, LDLDIRECT in the last 72 hours. Thyroid Function Tests: No results for input(s): TSH, T4TOTAL, FREET4, T3FREE,  THYROIDAB in the last 72 hours. Anemia Panel: No results for input(s): VITAMINB12, FOLATE, FERRITIN, TIBC, IRON, RETICCTPCT in the last 72 hours. Urine analysis:    Component Value Date/Time   COLORURINE YELLOW 09/01/2020 1403   APPEARANCEUR HAZY (A) 09/01/2020 1403   LABSPEC 1.019 09/01/2020 1403   PHURINE 6.0 09/01/2020 1403   GLUCOSEU NEGATIVE 09/01/2020 1403   GLUCOSEU NEGATIVE 12/11/2019 1033   HGBUR  NEGATIVE 09/01/2020 [place] 09/01/2020 [place] 09/01/2020 1403   PROTEINUR 30 (A) 09/01/2020 1403   UROBILINOGEN 0.2 12/11/2019 1033   NITRITE NEGATIVE 09/01/2020 1403   LEUKOCYTESUR TRACE (A) 09/01/2020 1403    Radiological Exams on Admission: DG Chest 1 View  Result Date: 09/01/2020 CLINICAL DATA:  Shortness of breath EXAM: CHEST  1 VIEW COMPARISON:  Chest CT August 22, 2019 and chest radiograph July 15, 2020 FINDINGS: Enlarged cardiac silhouette. Aortic atherosclerosis. Diffuse interstitial opacities. Similar patchy opacity in the left upper lobe, likely involving post radiation change. Probable small bilateral pleural effusions. IMPRESSION: Cardiomegaly with diffuse interstitial opacities, likely edema as well as probable small bilateral pleural effusions. Electronically Signed   By: Dahlia Bailiff MD   On: 09/01/2020 12:30   CT Angio Chest PE W/Cm &/Or Wo Cm  Result Date: 09/01/2020 CLINICAL DATA:  Cough, epigastric pain, nonlocalized abdominal pain. EXAM: CT ANGIOGRAPHY CHEST CT ABDOMEN AND PELVIS WITH CONTRAST TECHNIQUE: Multidetector CT imaging of the chest was performed using the standard protocol during bolus administration of intravenous contrast. Multiplanar CT image reconstructions and MIPs were obtained to evaluate the vascular anatomy. Multidetector CT imaging of the abdomen and pelvis was performed using the standard protocol during bolus administration of intravenous contrast. CONTRAST:  174mL OMNIPAQUE IOHEXOL 350 MG/ML SOLN  COMPARISON:  PET CT 01/16/2020. FINDINGS: CTA CHEST FINDINGS Cardiovascular: Satisfactory opacification of the pulmonary arteries to the segmental level. No evidence of pulmonary embolism. The left atria is enlarged in size. Otherwise remaining of the heart is normal in size. No significant pericardial effusion. Aortic valve replacement. The thoracic aorta is normal in caliber. Severe atherosclerotic plaque of the thoracic aorta. At least mild to moderate four-vessel coronary artery calcifications. Mild mild for annular calcifications. Mediastinum/Nodes: Several enlarged mediastinal lymph nodes with as an example a stable 1.3 cm right paratracheal lymph node (5:53). Also interval development of a 1.3 cm precarinal lymph node (5:66). No hilar or axillary lymph nodes. Thyroid gland, trachea, and esophagus demonstrate no significant findings. Lungs/Pleura: Moderate paraseptal emphysematous changes. Left upper lung consolidation as well as surrounding ground-glass airspace opacities. Poorly visualized 2.1 cm nodular-like density within the left lower lobe could represent interval decrease in size of a prior noted pulmonary nodule (7:55). Previously noted 5 mm right lower lobe nodule not well visualized on this study. No pulmonary nodule or definite pulmonary mass. Bilateral, left greater than right, trace to small volume pleural effusions. No pneumothorax. Musculoskeletal: No chest wall abnormality. No suspicious lytic or blastic osseous lesions. No acute displaced fracture. Multilevel degenerative changes of the spine. Review of the MIP images confirms the above findings. CT ABDOMEN and PELVIS FINDINGS Hepatobiliary: No focal liver abnormality. Status post cholecystectomy. Persistent enlargement of the common bile duct measuring up to 1.7 cm as well as mild central intrahepatic biliary ductal dilatation. Pancreas: No focal lesion. Normal pancreatic contour. No surrounding inflammatory changes. No main pancreatic ductal  dilatation. Spleen: Normal in size without focal abnormality. Adrenals/Urinary Tract: Redemonstration of a grossly stable in size 2.5 x 1.3 cm left adrenal gland nodule with a density of 101 Hounsfield units. No right adrenal gland nodule. Bilateral kidneys enhance symmetrically. Redemonstration of a stable 1.6 cm parapelvic cyst on the left. Subcentimeter hypodensities are too small to characterize. No hydronephrosis. No hydroureter. The urinary bladder is unremarkable. Stomach/Bowel: Stomach is within normal limits. No evidence of bowel wall thickening or dilatation. Scattered colonic diverticulosis. The appendix is not definitely identified. Vascular/Lymphatic: No  abdominal aorta or iliac aneurysm. Severe atherosclerotic plaque of the aorta and its branches. No abdominal, pelvic, or inguinal lymphadenopathy. Reproductive: The uterus and bilateral adnexal regions are unremarkable. Other: No intraperitoneal free fluid. No intraperitoneal free gas. No organized fluid collection. Musculoskeletal: Small umbilical hernia containing the anti-mesenteric wall of a short loop of transverse colon. Associated abdominal defect of 3.5 x 2.5 cm. Redemonstration of L4 through S1 posterolateral fusion in laminectomy. No suspicious lytic or blastic osseous lesions. No acute displaced fracture. Multilevel severe degenerative changes of the spine. Review of the MIP images confirms the above findings. IMPRESSION: 1. Left upper lobe findings suggestive of infection/inflammation with underlying malignancy not excluded. At least a followup PA and lateral chest X-ray is recommended in 3-4 weeks following trial of antibiotic therapy to ensure resolution and exclude underlying malignancy. 2. Poorly visualized 2.1 cm nodular-like density within the left lower lobe could represent interval decrease in size of a prior noted pulmonary nodule that represents a known primary bronchogenic neoplasm. 3. Interval development of bilateral trace to  small volume pleural effusion. 4. Indeterminate mediastinal lymphadenopathy that may be reactive in etiology. 5. Stable in size 2.5 cm left adrenal gland nodule. 6. Other imaging findings of potential clinical significance: Stable common bile duct dilatation - can be seen in the post cholecystectomy setting as in this patient. Small Richter umbilical hernia containing short loop of transverse colon - no findings suggest ischemia or bowel obstruction. Colonic diverticulosis with no acute diverticulitis. Aortic Atherosclerosis (ICD10-I70.0) and Emphysema (ICD10-J43.9). Electronically Signed   By: Iven Finn M.D.   On: 09/01/2020 16:19   CT Abdomen Pelvis W Contrast  Result Date: 09/01/2020 CLINICAL DATA:  Cough, epigastric pain, nonlocalized abdominal pain. EXAM: CT ANGIOGRAPHY CHEST CT ABDOMEN AND PELVIS WITH CONTRAST TECHNIQUE: Multidetector CT imaging of the chest was performed using the standard protocol during bolus administration of intravenous contrast. Multiplanar CT image reconstructions and MIPs were obtained to evaluate the vascular anatomy. Multidetector CT imaging of the abdomen and pelvis was performed using the standard protocol during bolus administration of intravenous contrast. CONTRAST:  178mL OMNIPAQUE IOHEXOL 350 MG/ML SOLN COMPARISON:  PET CT 01/16/2020. FINDINGS: CTA CHEST FINDINGS Cardiovascular: Satisfactory opacification of the pulmonary arteries to the segmental level. No evidence of pulmonary embolism. The left atria is enlarged in size. Otherwise remaining of the heart is normal in size. No significant pericardial effusion. Aortic valve replacement. The thoracic aorta is normal in caliber. Severe atherosclerotic plaque of the thoracic aorta. At least mild to moderate four-vessel coronary artery calcifications. Mild mild for annular calcifications. Mediastinum/Nodes: Several enlarged mediastinal lymph nodes with as an example a stable 1.3 cm right paratracheal lymph node (5:53).  Also interval development of a 1.3 cm precarinal lymph node (5:66). No hilar or axillary lymph nodes. Thyroid gland, trachea, and esophagus demonstrate no significant findings. Lungs/Pleura: Moderate paraseptal emphysematous changes. Left upper lung consolidation as well as surrounding ground-glass airspace opacities. Poorly visualized 2.1 cm nodular-like density within the left lower lobe could represent interval decrease in size of a prior noted pulmonary nodule (7:55). Previously noted 5 mm right lower lobe nodule not well visualized on this study. No pulmonary nodule or definite pulmonary mass. Bilateral, left greater than right, trace to small volume pleural effusions. No pneumothorax. Musculoskeletal: No chest wall abnormality. No suspicious lytic or blastic osseous lesions. No acute displaced fracture. Multilevel degenerative changes of the spine. Review of the MIP images confirms the above findings. CT ABDOMEN and PELVIS FINDINGS Hepatobiliary: No focal liver  abnormality. Status post cholecystectomy. Persistent enlargement of the common bile duct measuring up to 1.7 cm as well as mild central intrahepatic biliary ductal dilatation. Pancreas: No focal lesion. Normal pancreatic contour. No surrounding inflammatory changes. No main pancreatic ductal dilatation. Spleen: Normal in size without focal abnormality. Adrenals/Urinary Tract: Redemonstration of a grossly stable in size 2.5 x 1.3 cm left adrenal gland nodule with a density of 101 Hounsfield units. No right adrenal gland nodule. Bilateral kidneys enhance symmetrically. Redemonstration of a stable 1.6 cm parapelvic cyst on the left. Subcentimeter hypodensities are too small to characterize. No hydronephrosis. No hydroureter. The urinary bladder is unremarkable. Stomach/Bowel: Stomach is within normal limits. No evidence of bowel wall thickening or dilatation. Scattered colonic diverticulosis. The appendix is not definitely identified. Vascular/Lymphatic:  No abdominal aorta or iliac aneurysm. Severe atherosclerotic plaque of the aorta and its branches. No abdominal, pelvic, or inguinal lymphadenopathy. Reproductive: The uterus and bilateral adnexal regions are unremarkable. Other: No intraperitoneal free fluid. No intraperitoneal free gas. No organized fluid collection. Musculoskeletal: Small umbilical hernia containing the anti-mesenteric wall of a short loop of transverse colon. Associated abdominal defect of 3.5 x 2.5 cm. Redemonstration of L4 through S1 posterolateral fusion in laminectomy. No suspicious lytic or blastic osseous lesions. No acute displaced fracture. Multilevel severe degenerative changes of the spine. Review of the MIP images confirms the above findings. IMPRESSION: 1. Left upper lobe findings suggestive of infection/inflammation with underlying malignancy not excluded. At least a followup PA and lateral chest X-ray is recommended in 3-4 weeks following trial of antibiotic therapy to ensure resolution and exclude underlying malignancy. 2. Poorly visualized 2.1 cm nodular-like density within the left lower lobe could represent interval decrease in size of a prior noted pulmonary nodule that represents a known primary bronchogenic neoplasm. 3. Interval development of bilateral trace to small volume pleural effusion. 4. Indeterminate mediastinal lymphadenopathy that may be reactive in etiology. 5. Stable in size 2.5 cm left adrenal gland nodule. 6. Other imaging findings of potential clinical significance: Stable common bile duct dilatation - can be seen in the post cholecystectomy setting as in this patient. Small Richter umbilical hernia containing short loop of transverse colon - no findings suggest ischemia or bowel obstruction. Colonic diverticulosis with no acute diverticulitis. Aortic Atherosclerosis (ICD10-I70.0) and Emphysema (ICD10-J43.9). Electronically Signed   By: Iven Finn M.D.   On: 09/01/2020 16:19    EKG: Independently  reviewed.  A. fib with questionable QT prolongation   Little Ishikawa DO [facility] For contact please use secure messenger on Epic  If 7PM-7AM, please contact night-coverage located on [email or web address]   09/01/2020, 4:44 PM

## 2020-09-01 NOTE — Consult Note (Signed)
Tara Fields 14-Aug-1928  [id number].    Requesting MD: Dr. Almyra Free Chief Complaint/Reason for Consult: umbilical hernia  HPI:  Ms. Tara Fields is a 87 yo female with a history of CAD, COPD, DM, GERD, and HLD who presented to the ED today with abdominal pain and shortness of breath. She reports that the abdominal pain was in her upper abdomen and she felt full. She was also found to be hypoxic and was placed on oxygen. She underwent CT scans of the chest/abd/pelvis, which showed inflammation in the left upper lobe concerning for pneumonia. The patient also has a LLL nodule that is likely a lung malignancy, for which she has been treated with SBRT this year. The patient has been admitted to medicine for treatment of CAP. CT abd/pelvis showed an umbilical hernia containing a portion of colon. General surgery was consulted for evaluation of the hernia. The patient reports she was told about the hernia a few years ago but it has not really bothered her. She endorses constipation. No vomiting.  Prior abdominal surgeries include hysterectomy (via a midline incision) and appendectomy.  ROS: Review of Systems  Constitutional: Negative for chills and fever.  Respiratory: Positive for shortness of breath. Negative for stridor.   Cardiovascular: Negative for chest pain.  Gastrointestinal: Positive for abdominal pain and constipation.  Neurological: Negative for speech change and focal weakness.    Family History  Problem Relation Age of Onset  . Diabetes Mother   . Stroke Brother   . Cerebral palsy Brother   . Coronary artery disease Neg Hx     Past Medical History:  Diagnosis Date  . Aortic stenosis    s/p TAVR 26 mm Edwards Sapien 3 THV July 2020  . CAD (coronary artery disease)   . COPD (chronic obstructive pulmonary disease) ([place])   . Diabetes mellitus ([place])   . Diverticulitis   . GERD (gastroesophageal reflux disease)   . HTN (hypertension)   . Hyperlipidemia   . Legally  blind in left eye, as defined in [place]   . Tobacco abuse     Past Surgical History:  Procedure Laterality Date  . ABDOMINAL HYSTERECTOMY    . APPENDECTOMY    . BACK SURGERY    . CATARACT EXTRACTION, BILATERAL    . CHOLECYSTECTOMY      Social History:  reports that she quit smoking about 8 months ago. Her smoking use included cigarettes. She has a 70.00 pack-year smoking history. She has never used smokeless tobacco. She reports that she does not drink alcohol and does not use drugs.  Allergies:  Allergies  Allergen Reactions  . Amlodipine Besy-Benazepril Hcl Other (See Comments)    cough  . Hctz [Hydrochlorothiazide] Other (See Comments)  . Diclofenac Sodium Diarrhea  . Hydralazine     (Apresoline *ANTIHYPERTENSIVES*) HEPATITIS  . Methylprednisolone     (Medrol (Pak) *CORTICOSTEROIDS*) STATES ANGER, BUT ALOT OF ENERGY    Medications Prior to Admission  Medication Sig Dispense Refill  . acetaminophen (TYLENOL) 325 MG tablet Take 650 mg by mouth every 6 (six) hours as needed (SEVERE PAIN).    [name] amLODipine (NORVASC) 10 MG tablet Take 10 mg by mouth daily.    [name] aspirin 81 MG chewable tablet Chew 1 tablet (81 mg total) by mouth daily. 90 tablet 3  . atorvastatin (LIPITOR) 40 MG tablet TAKE 1 TABLET (40 MG TOTAL) BY MOUTH DAILY AT 6 PM. 90 tablet 0  . Cholecalciferol (VITAMIN D-3 PO) Take 5,000 Units by mouth  2 (two) times daily.    . Coenzyme Q10-Fish Oil-Vit E (CO-Q 10 OMEGA-3 FISH OIL PO) Take 400 mg by mouth daily.    [name] ELIQUIS 5 MG TABS tablet TAKE 1 TABLET BY MOUTH TWICE A DAY (Patient taking differently: Take 5 mg by mouth 2 (two) times daily.) 180 tablet 0  . fenofibrate 160 MG tablet TAKE 1 TABLET BY MOUTH EVERY DAY (Patient taking differently: Take 160 mg by mouth every evening.) 90 tablet 1  . ferrous gluconate (FERGON) 324 MG tablet TAKE 1 TABLET BY MOUTH EVERY DAY WITH BREAKFAST (Patient taking differently: Take 324 mg by mouth daily with breakfast.) 90 tablet 0  .  losartan (COZAAR) 100 MG tablet TAKE 1 TABLET BY MOUTH EVERY DAY (Patient taking differently: Take 100 mg by mouth daily.) 90 tablet 0  . metoprolol tartrate (LOPRESSOR) 50 MG tablet Take 1 tablet (50 mg total) by mouth 2 (two) times daily. 180 tablet 1  . Multiple Vitamins-Minerals (WOMENS 50+ MULTI VITAMIN/MIN PO) Take 1 tablet by mouth daily. CENTRUM SILVER    . PROAIR HFA 108 (90 Base) MCG/ACT inhaler Use 2 inhalations every 4-6 hours as needed for wheezing or coughing. 1 Inhaler 3  . terazosin (HYTRIN) 10 MG capsule TAKE 1 CAPSULE (10 MG TOTAL) BY MOUTH AT BEDTIME. (Patient taking differently: Take 10 mg by mouth daily at 12 noon.) 90 capsule 0  . traMADol (ULTRAM) 50 MG tablet Take 50 mg by mouth 3 (three) times daily as needed for pain.       Physical Exam: Blood pressure 140/60, pulse 87, temperature (!) 97.3 F (36.3 C), temperature source Oral, resp. rate 20, height 5' (1.524 m), weight 72.1 kg, SpO2 97 %. General: resting comfortably, appears stated age, no apparent distress Neurological: alert and oriented, no focal deficits, cranial nerves grossly in tact HEENT: normocephalic, atraumatic, oropharynx clear, no scleral icterus CV: extremities warm and well-perfused Respiratory: normal work of breathing on nasal cannula, symmetric chest wall expansion Abdomen: soft, nondistended, nontender to deep palpation. No masses or organomegaly. Small umbilical hernia, soft and nontender to palpation. Extremities: warm and well-perfused, no deformities, moving all extremities spontaneously Psychiatric: normal mood and affect Skin: warm and dry, no jaundice, no rashes or lesions   Results for orders placed or performed during the hospital encounter of 09/01/20 (from the past 48 hour(s))  Resp Panel by RT-PCR (Flu A&B, Covid) Nasopharyngeal Swab     Status: None   Collection Time: 09/01/20 10:58 AM   Specimen: Nasopharyngeal Swab; Nasopharyngeal(NP) swabs in vial transport medium  Result  Value Ref Range   SARS Coronavirus 2 by RT PCR NEGATIVE NEGATIVE    Comment: (NOTE) SARS-CoV-2 target nucleic acids are NOT DETECTED.  The SARS-CoV-2 RNA is generally detectable in upper respiratory specimens during the acute phase of infection. The lowest concentration of SARS-CoV-2 viral copies this assay can detect is 138 copies/mL. A negative result does not preclude SARS-Cov-2 infection and should not be used as the sole basis for treatment or other patient management decisions. A negative result may occur with  improper specimen collection/handling, submission of specimen other than nasopharyngeal swab, presence of viral mutation(s) within the areas targeted by this assay, and inadequate number of viral copies(<138 copies/mL). A negative result must be combined with clinical observations, patient history, and epidemiological information. The expected result is Negative.  Fact Sheet for Patients:  EntrepreneurPulse.com.au  Fact Sheet for Healthcare Providers:  IncredibleEmployment.be  This test is no t yet approved or cleared by the  [place] Architectural technologist and  has been authorized for detection and/or diagnosis of SARS-CoV-2 by FDA under an Print production planner (EUA). This EUA will remain  in effect (meaning this test can be used) for the duration of the COVID-19 declaration under Section 564(b)(1) of the Act, 21 U.S.C.section 360bbb-3(b)(1), unless the authorization is terminated  or revoked sooner.       Influenza A by PCR NEGATIVE NEGATIVE   Influenza B by PCR NEGATIVE NEGATIVE    Comment: (NOTE) The Xpert Xpress SARS-CoV-2/FLU/RSV plus assay is intended as an aid in the diagnosis of influenza from Nasopharyngeal swab specimens and should not be used as a sole basis for treatment. Nasal washings and aspirates are unacceptable for Xpert Xpress SARS-CoV-2/FLU/RSV testing.  Fact Sheet for  Patients: EntrepreneurPulse.com.au  Fact Sheet for Healthcare Providers: IncredibleEmployment.be  This test is not yet approved or cleared by the [place] FDA and has been authorized for detection and/or diagnosis of SARS-CoV-2 by FDA under an Emergency Use Authorization (EUA). This EUA will remain in effect (meaning this test can be used) for the duration of the COVID-19 declaration under Section 564(b)(1) of the Act, 21 U.S.C. section 360bbb-3(b)(1), unless the authorization is terminated or revoked.  Performed at [facility], [place] [address], [place], [place] [address]   Lipase, blood     Status: None   Collection Time: 09/01/20 11:32 AM  Result Value Ref Range   Lipase 18 11 - 51 U/L    Comment: Performed at [facility] [address], [place], [place] [address]  Comprehensive metabolic panel     Status: Abnormal   Collection Time: 09/01/20 11:32 AM  Result Value Ref Range   Sodium 141 135 - 145 mmol/L   Potassium 3.6 3.5 - 5.1 mmol/L   Chloride 105 98 - 111 mmol/L   CO2 23 22 - 32 mmol/L   Glucose, Bld 142 (H) 70 - 99 mg/dL    Comment: Glucose reference range applies only to samples taken after fasting for at least 8 hours.   BUN 13 8 - 23 mg/dL   Creatinine, Ser 0.82 0.44 - 1.00 mg/dL   Calcium 9.0 8.9 - 10.3 mg/dL   Total Protein 6.4 (L) 6.5 - 8.1 g/dL   Albumin 3.5 3.5 - 5.0 g/dL   AST 21 15 - 41 U/L   ALT 15 0 - 44 U/L   Alkaline Phosphatase 40 38 - 126 U/L   Total Bilirubin 1.0 0.3 - 1.2 mg/dL   GFR, Estimated >60 >60 mL/min    Comment: (NOTE) Calculated using the CKD-EPI Creatinine Equation (2021)    Anion gap 13 5 - 15    Comment: Performed at [facility] [address], [place], [place] [address]  CBC     Status: Abnormal   Collection Time: 09/01/20 11:32 AM  Result Value Ref Range   WBC 6.1 4.0 - 10.5 K/uL   RBC 3.98 3.87 - 5.11 MIL/uL   Hemoglobin 11.5 (L) 12.0 - 15.0 g/dL   HCT 37.2 36.0  - 46.0 %   MCV 93.5 80.0 - 100.0 fL   MCH 28.9 26.0 - 34.0 pg   MCHC 30.9 30.0 - 36.0 g/dL   RDW 14.8 11.5 - 15.5 %   Platelets 236 150 - 400 K/uL   nRBC 0.0 0.0 - 0.2 %    Comment: Performed at [facility], [place] [address], [place], [place] [address]  Troponin I (High Sensitivity)  Status: None   Collection Time: 09/01/20 12:22 PM  Result Value Ref Range   Troponin I (High Sensitivity) 9 <18 ng/L    Comment: (NOTE) Elevated high sensitivity troponin I (hsTnI) values and significant  changes across serial measurements may suggest ACS but many other  chronic and acute conditions are known to elevate hsTnI results.  Refer to the "Links" section for chest pain algorithms and additional  guidance. Performed at [facility], [place] [address], [place], [place] [address]   Brain natriuretic peptide     Status: Abnormal   Collection Time: 09/01/20 12:23 PM  Result Value Ref Range   B Natriuretic Peptide 1,304.5 (H) 0.0 - 100.0 pg/mL    Comment: Performed at [facility] [address], [place], [place] [address]  Urinalysis, Routine w reflex microscopic Urine, Clean Catch     Status: Abnormal   Collection Time: 09/01/20  2:03 PM  Result Value Ref Range   Color, Urine YELLOW YELLOW   APPearance HAZY (A) CLEAR   Specific Gravity, Urine 1.019 1.005 - 1.030   pH 6.0 5.0 - 8.0   Glucose, UA NEGATIVE NEGATIVE mg/dL   Hgb urine dipstick NEGATIVE NEGATIVE   Bilirubin Urine NEGATIVE NEGATIVE   Ketones, ur NEGATIVE NEGATIVE mg/dL   Protein, ur 30 (A) NEGATIVE mg/dL   Nitrite NEGATIVE NEGATIVE   Leukocytes,Ua TRACE (A) NEGATIVE   RBC / HPF 0-5 0 - 5 RBC/hpf   WBC, UA 6-10 0 - 5 WBC/hpf   Bacteria, UA RARE (A) NONE SEEN   Squamous Epithelial / LPF 0-5 0 - 5   Mucus PRESENT    Hyaline Casts, UA PRESENT     Comment: Performed at [facility], [address] [place], [place] [address]  Troponin I (High Sensitivity)     Status: None   Collection Time: 09/01/20  2:04  PM  Result Value Ref Range   Troponin I (High Sensitivity) 9 <18 ng/L    Comment: (NOTE) Elevated high sensitivity troponin I (hsTnI) values and significant  changes across serial measurements may suggest ACS but many other  chronic and acute conditions are known to elevate hsTnI results.  Refer to the "Links" section for chest pain algorithms and additional  guidance. Performed at [facility], [place] [address], [place], [place] [address]    DG Chest 1 View  Result Date: 09/01/2020 CLINICAL DATA:  Shortness of breath EXAM: CHEST  1 VIEW COMPARISON:  Chest CT August 22, 2019 and chest radiograph July 15, 2020 FINDINGS: Enlarged cardiac silhouette. Aortic atherosclerosis. Diffuse interstitial opacities. Similar patchy opacity in the left upper lobe, likely involving post radiation change. Probable small bilateral pleural effusions. IMPRESSION: Cardiomegaly with diffuse interstitial opacities, likely edema as well as probable small bilateral pleural effusions. Electronically Signed   By: Dahlia Bailiff MD   On: 09/01/2020 12:30   CT Angio Chest PE W/Cm &/Or Wo Cm  Result Date: 09/01/2020 CLINICAL DATA:  Cough, epigastric pain, nonlocalized abdominal pain. EXAM: CT ANGIOGRAPHY CHEST CT ABDOMEN AND PELVIS WITH CONTRAST TECHNIQUE: Multidetector CT imaging of the chest was performed using the standard protocol during bolus administration of intravenous contrast. Multiplanar CT image reconstructions and MIPs were obtained to evaluate the vascular anatomy. Multidetector CT imaging of the abdomen and pelvis was performed using the standard protocol during bolus administration of intravenous contrast. CONTRAST:  144mL OMNIPAQUE IOHEXOL 350 MG/ML SOLN COMPARISON:  PET CT 01/16/2020. FINDINGS: CTA CHEST FINDINGS Cardiovascular: Satisfactory opacification of  the pulmonary arteries to the segmental level. No evidence of pulmonary embolism. The left atria is enlarged in size. Otherwise remaining of the  heart is normal in size. No significant pericardial effusion. Aortic valve replacement. The thoracic aorta is normal in caliber. Severe atherosclerotic plaque of the thoracic aorta. At least mild to moderate four-vessel coronary artery calcifications. Mild mild for annular calcifications. Mediastinum/Nodes: Several enlarged mediastinal lymph nodes with as an example a stable 1.3 cm right paratracheal lymph node (5:53). Also interval development of a 1.3 cm precarinal lymph node (5:66). No hilar or axillary lymph nodes. Thyroid gland, trachea, and esophagus demonstrate no significant findings. Lungs/Pleura: Moderate paraseptal emphysematous changes. Left upper lung consolidation as well as surrounding ground-glass airspace opacities. Poorly visualized 2.1 cm nodular-like density within the left lower lobe could represent interval decrease in size of a prior noted pulmonary nodule (7:55). Previously noted 5 mm right lower lobe nodule not well visualized on this study. No pulmonary nodule or definite pulmonary mass. Bilateral, left greater than right, trace to small volume pleural effusions. No pneumothorax. Musculoskeletal: No chest wall abnormality. No suspicious lytic or blastic osseous lesions. No acute displaced fracture. Multilevel degenerative changes of the spine. Review of the MIP images confirms the above findings. CT ABDOMEN and PELVIS FINDINGS Hepatobiliary: No focal liver abnormality. Status post cholecystectomy. Persistent enlargement of the common bile duct measuring up to 1.7 cm as well as mild central intrahepatic biliary ductal dilatation. Pancreas: No focal lesion. Normal pancreatic contour. No surrounding inflammatory changes. No main pancreatic ductal dilatation. Spleen: Normal in size without focal abnormality. Adrenals/Urinary Tract: Redemonstration of a grossly stable in size 2.5 x 1.3 cm left adrenal gland nodule with a density of 101 Hounsfield units. No right adrenal gland nodule. Bilateral  kidneys enhance symmetrically. Redemonstration of a stable 1.6 cm parapelvic cyst on the left. Subcentimeter hypodensities are too small to characterize. No hydronephrosis. No hydroureter. The urinary bladder is unremarkable. Stomach/Bowel: Stomach is within normal limits. No evidence of bowel wall thickening or dilatation. Scattered colonic diverticulosis. The appendix is not definitely identified. Vascular/Lymphatic: No abdominal aorta or iliac aneurysm. Severe atherosclerotic plaque of the aorta and its branches. No abdominal, pelvic, or inguinal lymphadenopathy. Reproductive: The uterus and bilateral adnexal regions are unremarkable. Other: No intraperitoneal free fluid. No intraperitoneal free gas. No organized fluid collection. Musculoskeletal: Small umbilical hernia containing the anti-mesenteric wall of a short loop of transverse colon. Associated abdominal defect of 3.5 x 2.5 cm. Redemonstration of L4 through S1 posterolateral fusion in laminectomy. No suspicious lytic or blastic osseous lesions. No acute displaced fracture. Multilevel severe degenerative changes of the spine. Review of the MIP images confirms the above findings. IMPRESSION: 1. Left upper lobe findings suggestive of infection/inflammation with underlying malignancy not excluded. At least a followup PA and lateral chest X-ray is recommended in 3-4 weeks following trial of antibiotic therapy to ensure resolution and exclude underlying malignancy. 2. Poorly visualized 2.1 cm nodular-like density within the left lower lobe could represent interval decrease in size of a prior noted pulmonary nodule that represents a known primary bronchogenic neoplasm. 3. Interval development of bilateral trace to small volume pleural effusion. 4. Indeterminate mediastinal lymphadenopathy that may be reactive in etiology. 5. Stable in size 2.5 cm left adrenal gland nodule. 6. Other imaging findings of potential clinical significance: Stable common bile duct  dilatation - can be seen in the post cholecystectomy setting as in this patient. Small Richter umbilical hernia containing short loop of transverse colon - no findings  suggest ischemia or bowel obstruction. Colonic diverticulosis with no acute diverticulitis. Aortic Atherosclerosis (ICD10-I70.0) and Emphysema (ICD10-J43.9). Electronically Signed   By: Iven Finn M.D.   On: 09/01/2020 16:19   CT Abdomen Pelvis W Contrast  Result Date: 09/01/2020 CLINICAL DATA:  Cough, epigastric pain, nonlocalized abdominal pain. EXAM: CT ANGIOGRAPHY CHEST CT ABDOMEN AND PELVIS WITH CONTRAST TECHNIQUE: Multidetector CT imaging of the chest was performed using the standard protocol during bolus administration of intravenous contrast. Multiplanar CT image reconstructions and MIPs were obtained to evaluate the vascular anatomy. Multidetector CT imaging of the abdomen and pelvis was performed using the standard protocol during bolus administration of intravenous contrast. CONTRAST:  144mL OMNIPAQUE IOHEXOL 350 MG/ML SOLN COMPARISON:  PET CT 01/16/2020. FINDINGS: CTA CHEST FINDINGS Cardiovascular: Satisfactory opacification of the pulmonary arteries to the segmental level. No evidence of pulmonary embolism. The left atria is enlarged in size. Otherwise remaining of the heart is normal in size. No significant pericardial effusion. Aortic valve replacement. The thoracic aorta is normal in caliber. Severe atherosclerotic plaque of the thoracic aorta. At least mild to moderate four-vessel coronary artery calcifications. Mild mild for annular calcifications. Mediastinum/Nodes: Several enlarged mediastinal lymph nodes with as an example a stable 1.3 cm right paratracheal lymph node (5:53). Also interval development of a 1.3 cm precarinal lymph node (5:66). No hilar or axillary lymph nodes. Thyroid gland, trachea, and esophagus demonstrate no significant findings. Lungs/Pleura: Moderate paraseptal emphysematous changes. Left upper lung  consolidation as well as surrounding ground-glass airspace opacities. Poorly visualized 2.1 cm nodular-like density within the left lower lobe could represent interval decrease in size of a prior noted pulmonary nodule (7:55). Previously noted 5 mm right lower lobe nodule not well visualized on this study. No pulmonary nodule or definite pulmonary mass. Bilateral, left greater than right, trace to small volume pleural effusions. No pneumothorax. Musculoskeletal: No chest wall abnormality. No suspicious lytic or blastic osseous lesions. No acute displaced fracture. Multilevel degenerative changes of the spine. Review of the MIP images confirms the above findings. CT ABDOMEN and PELVIS FINDINGS Hepatobiliary: No focal liver abnormality. Status post cholecystectomy. Persistent enlargement of the common bile duct measuring up to 1.7 cm as well as mild central intrahepatic biliary ductal dilatation. Pancreas: No focal lesion. Normal pancreatic contour. No surrounding inflammatory changes. No main pancreatic ductal dilatation. Spleen: Normal in size without focal abnormality. Adrenals/Urinary Tract: Redemonstration of a grossly stable in size 2.5 x 1.3 cm left adrenal gland nodule with a density of 101 Hounsfield units. No right adrenal gland nodule. Bilateral kidneys enhance symmetrically. Redemonstration of a stable 1.6 cm parapelvic cyst on the left. Subcentimeter hypodensities are too small to characterize. No hydronephrosis. No hydroureter. The urinary bladder is unremarkable. Stomach/Bowel: Stomach is within normal limits. No evidence of bowel wall thickening or dilatation. Scattered colonic diverticulosis. The appendix is not definitely identified. Vascular/Lymphatic: No abdominal aorta or iliac aneurysm. Severe atherosclerotic plaque of the aorta and its branches. No abdominal, pelvic, or inguinal lymphadenopathy. Reproductive: The uterus and bilateral adnexal regions are unremarkable. Other: No intraperitoneal  free fluid. No intraperitoneal free gas. No organized fluid collection. Musculoskeletal: Small umbilical hernia containing the anti-mesenteric wall of a short loop of transverse colon. Associated abdominal defect of 3.5 x 2.5 cm. Redemonstration of L4 through S1 posterolateral fusion in laminectomy. No suspicious lytic or blastic osseous lesions. No acute displaced fracture. Multilevel severe degenerative changes of the spine. Review of the MIP images confirms the above findings. IMPRESSION: 1. Left upper lobe findings suggestive of  infection/inflammation with underlying malignancy not excluded. At least a followup PA and lateral chest X-ray is recommended in 3-4 weeks following trial of antibiotic therapy to ensure resolution and exclude underlying malignancy. 2. Poorly visualized 2.1 cm nodular-like density within the left lower lobe could represent interval decrease in size of a prior noted pulmonary nodule that represents a known primary bronchogenic neoplasm. 3. Interval development of bilateral trace to small volume pleural effusion. 4. Indeterminate mediastinal lymphadenopathy that may be reactive in etiology. 5. Stable in size 2.5 cm left adrenal gland nodule. 6. Other imaging findings of potential clinical significance: Stable common bile duct dilatation - can be seen in the post cholecystectomy setting as in this patient. Small Richter umbilical hernia containing short loop of transverse colon - no findings suggest ischemia or bowel obstruction. Colonic diverticulosis with no acute diverticulitis. Aortic Atherosclerosis (ICD10-I70.0) and Emphysema (ICD10-J43.9). Electronically Signed   By: Iven Finn M.D.   On: 09/01/2020 16:19      Assessment/Plan 87 yo female admitted with hypoxia and community-acquired pneumonia, with an umbilical hernia. Her abdominal pain had resolved by the time of my exam, and her described epigastric pain was likely not related to the umbilical hernia. I personally  reviewed the CT scan - there is a small umbilical hernia containing bowel, but no signs of obstruction or bowel ischemia. The hernia is soft and nontender on exam.Given her age and multiple medical comorbidities, I would not recommend surgical repair of this hernia unless it becomes highly symptomatic or causes bowel obstruction or strangulation. The hernia seems largely asymptomatic at this time. No acute surgical intervention indicated. I discussed this with the patient. Further follow up with general surgery as needed. Please call with any further questions or concerns.  Michaelle Birks, [facility] General, Hepatobiliary and Pancreatic Surgery 09/01/20 9:27 PM

## 2020-09-01 NOTE — ED Provider Notes (Addendum)
[facility] Provider Note   CSN: [id number] Arrival date & time: 09/01/20  1038     History Chief Complaint  Patient presents with  . Abdominal Pain    [facility] is a 87 y.o. female.  Patient presents ER chief complaint of abdominal pressure sensation and shortness of breath.  Symptoms been ongoing for 2 days now.  Nonradiating.  Mid central abdominal region pressure-like sensation palpation.  Denies fevers or cough.  No vomiting or diarrhea.        Past Medical History:  Diagnosis Date  . Aortic stenosis    s/p TAVR 26 mm Edwards Sapien 3 THV July 2020  . CAD (coronary artery disease)   . COPD (chronic obstructive pulmonary disease) ([place])   . Diabetes mellitus ([place])   . Diverticulitis   . GERD (gastroesophageal reflux disease)   . HTN (hypertension)   . Hyperlipidemia   . Legally blind in left eye, as defined in [place]   . Tobacco abuse     Patient Active Problem List   Diagnosis Date Noted  . Acute respiratory failure with hypoxia ([place]) 09/01/2020  . Pulmonary nodule 1 cm or greater in diameter 01/03/2020  . S/P TAVR (transcatheter aortic valve replacement) 05/03/2019  . Primary osteoarthritis involving multiple joints 05/03/2019  . Other persistent atrial fibrillation ([place]) 05/03/2019  . Bilateral carotid artery stenosis 12/01/2017  . 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/13/2014  . Aortic valve disorder 05/03/2014  . Chronic obstructive bronchitis without exacerbation ([place]) 05/03/2014  . Derangement of left knee 05/03/2014  . Esophageal reflux 05/03/2014  . Essential hypertension 05/03/2014  . Pure hypercholesterolemia 05/03/2014  . Type II diabetes mellitus ([place]) 05/03/2014  . Vitamin D deficiency 05/03/2014    Past Surgical History:  Procedure Laterality Date  . ABDOMINAL HYSTERECTOMY    . APPENDECTOMY    . BACK SURGERY    . CATARACT EXTRACTION, BILATERAL    . CHOLECYSTECTOMY       OB History   No  obstetric history on file.     Family History  Problem Relation Age of Onset  . Diabetes Mother   . Stroke Brother   . Cerebral palsy Brother   . Coronary artery disease Neg Hx     Social History   Tobacco Use  . Smoking status: Former Smoker    Packs/day: 1.00    Years: 70.00    Pack years: 70.00    Types: Cigarettes    Quit date: 12/15/2019    Years since quitting: 0.7  . Smokeless tobacco: Never Used  . Tobacco comment: Pt states quiting a year ago. 01/03/20 [facility]  Substance Use Topics  . Alcohol use: No    Alcohol/week: 0.0 standard drinks  . Drug use: No    Home Medications Prior to Admission medications   Medication Sig Start Date End Date Taking? Authorizing Provider  acetaminophen (TYLENOL) 325 MG tablet Take 650 mg by mouth every 6 (six) hours as needed (SEVERE PAIN).   Yes [provider]  amLODipine (NORVASC) 10 MG tablet Take 10 mg by mouth daily.   Yes [provider]  aspirin 81 MG chewable tablet Chew 1 tablet (81 mg total) by mouth daily. 04/01/20  Yes Nafziger, Tommi Rumps, NP  atorvastatin (LIPITOR) 40 MG tablet TAKE 1 TABLET (40 MG TOTAL) BY MOUTH DAILY AT 6 PM. 07/22/20  Yes Nafziger, Tommi Rumps, NP  Cholecalciferol (VITAMIN D-3 PO) Take 5,000 Units by mouth 2 (two) times daily.   Yes  [provider]  Coenzyme Q10-Fish Oil-Vit E (CO-Q 10 OMEGA-3 FISH OIL PO) Take 400 mg by mouth daily.   Yes [provider]  ELIQUIS 5 MG TABS tablet TAKE 1 TABLET BY MOUTH TWICE A DAY Patient taking differently: Take 5 mg by mouth 2 (two) times daily. 07/22/20  Yes Nafziger, Tommi Rumps, NP  fenofibrate 160 MG tablet TAKE 1 TABLET BY MOUTH EVERY DAY Patient taking differently: Take 160 mg by mouth every evening. 05/19/20  Yes Nafziger, Tommi Rumps, NP  ferrous gluconate (FERGON) 324 MG tablet TAKE 1 TABLET BY MOUTH EVERY DAY WITH BREAKFAST Patient taking differently: Take 324 mg by mouth daily with breakfast. 07/22/20  Yes Nafziger, Tommi Rumps, NP  losartan (COZAAR) 100 MG  tablet TAKE 1 TABLET BY MOUTH EVERY DAY Patient taking differently: Take 100 mg by mouth daily. 07/22/20  Yes Burnell Blanks, MD  metoprolol tartrate (LOPRESSOR) 50 MG tablet Take 1 tablet (50 mg total) by mouth 2 (two) times daily. 01/29/20 04/28/20 Yes Nafziger, Tommi Rumps, NP  Multiple Vitamins-Minerals (WOMENS 50+ MULTI VITAMIN/MIN PO) Take 1 tablet by mouth daily. CENTRUM SILVER   Yes [provider]  PROAIR HFA 108 (90 Base) MCG/ACT inhaler Use 2 inhalations every 4-6 hours as needed for wheezing or coughing. 01/25/18  Yes Posey Boyer, MD  terazosin (HYTRIN) 10 MG capsule TAKE 1 CAPSULE (10 MG TOTAL) BY MOUTH AT BEDTIME. Patient taking differently: Take 10 mg by mouth daily at 12 noon. 07/22/20  Yes Nafziger, Tommi Rumps, NP  traMADol (ULTRAM) 50 MG tablet Take 50 mg by mouth 3 (three) times daily as needed for pain. 08/20/20  Yes [provider]    Allergies    Amlodipine besy-benazepril hcl, Hctz [hydrochlorothiazide], Diclofenac sodium, Hydralazine, and Methylprednisolone  Review of Systems   Review of Systems  Constitutional: Negative for fever.  HENT: Negative for ear pain.   Eyes: Negative for pain.  Respiratory: Positive for shortness of breath. Negative for cough.   Cardiovascular: Negative for chest pain.  Gastrointestinal: Positive for abdominal pain.  Genitourinary: Negative for flank pain.  Musculoskeletal: Negative for back pain.  Skin: Negative for rash.  Neurological: Negative for headaches.    Physical Exam Updated Vital Signs BP (!) 159/77   Pulse 91   Temp 98.6 F (37 C) (Oral)   Resp (!) 26   Ht 5' (1.524 m)   Wt 72.1 kg   SpO2 94%   BMI 31.05 kg/m   Physical Exam Constitutional:      General: She is not in acute distress.    Appearance: Normal appearance.  HENT:     Head: Normocephalic.     Nose: Nose normal.  Eyes:     Extraocular Movements: Extraocular movements intact.  Cardiovascular:     Rate and Rhythm: Normal rate.   Pulmonary:     Effort: Pulmonary effort is normal.  Abdominal:     Tenderness: There is abdominal tenderness in the periumbilical area.  Musculoskeletal:        General: Normal range of motion.     Cervical back: Normal range of motion.  Neurological:     General: No focal deficit present.     Mental Status: She is alert. Mental status is at baseline.     ED Results / Procedures / Treatments   Labs (all labs ordered are listed, but only abnormal results are displayed) Labs Reviewed  COMPREHENSIVE METABOLIC PANEL - Abnormal; Notable for the following components:      Result Value  Glucose, Bld 142 (*)    Total Protein 6.4 (*)    All other components within normal limits  CBC - Abnormal; Notable for the following components:   Hemoglobin 11.5 (*)    All other components within normal limits  URINALYSIS, ROUTINE W REFLEX MICROSCOPIC - Abnormal; Notable for the following components:   APPearance HAZY (*)    Protein, ur 30 (*)    Leukocytes,Ua TRACE (*)    Bacteria, UA RARE (*)    All other components within normal limits  BRAIN NATRIURETIC PEPTIDE - Abnormal; Notable for the following components:   B Natriuretic Peptide 1,304.5 (*)    All other components within normal limits  RESP PANEL BY RT-PCR (FLU A&B, COVID) ARPGX2  CULTURE, BLOOD (ROUTINE X 2)  CULTURE, BLOOD (ROUTINE X 2)  LIPASE, BLOOD  BASIC METABOLIC PANEL  CBC  TROPONIN I (HIGH SENSITIVITY)  TROPONIN I (HIGH SENSITIVITY)    EKG EKG Interpretation  Date/Time:  Monday September 01 2020 10:44:38 EST Ventricular Rate:  91 PR Interval:    QRS Duration: 168 QT Interval:  412 QTC Calculation: 506 R Axis:   -33 Text Interpretation: Atrial fibrillation with premature ventricular or aberrantly conducted complexes Left axis deviation Left bundle branch block Abnormal ECG Confirmed by Thamas Jaegers ([id number]) on 09/01/2020 11:45:57 AM   Radiology DG Chest 1 View  Result Date: 09/01/2020 CLINICAL DATA:  Shortness of  breath EXAM: CHEST  1 VIEW COMPARISON:  Chest CT August 22, 2019 and chest radiograph July 15, 2020 FINDINGS: Enlarged cardiac silhouette. Aortic atherosclerosis. Diffuse interstitial opacities. Similar patchy opacity in the left upper lobe, likely involving post radiation change. Probable small bilateral pleural effusions. IMPRESSION: Cardiomegaly with diffuse interstitial opacities, likely edema as well as probable small bilateral pleural effusions. Electronically Signed   By: Dahlia Bailiff MD   On: 09/01/2020 12:30   CT Angio Chest PE W/Cm &/Or Wo Cm  Result Date: 09/01/2020 CLINICAL DATA:  Cough, epigastric pain, nonlocalized abdominal pain. EXAM: CT ANGIOGRAPHY CHEST CT ABDOMEN AND PELVIS WITH CONTRAST TECHNIQUE: Multidetector CT imaging of the chest was performed using the standard protocol during bolus administration of intravenous contrast. Multiplanar CT image reconstructions and MIPs were obtained to evaluate the vascular anatomy. Multidetector CT imaging of the abdomen and pelvis was performed using the standard protocol during bolus administration of intravenous contrast. CONTRAST:  190mL OMNIPAQUE IOHEXOL 350 MG/ML SOLN COMPARISON:  PET CT 01/16/2020. FINDINGS: CTA CHEST FINDINGS Cardiovascular: Satisfactory opacification of the pulmonary arteries to the segmental level. No evidence of pulmonary embolism. The left atria is enlarged in size. Otherwise remaining of the heart is normal in size. No significant pericardial effusion. Aortic valve replacement. The thoracic aorta is normal in caliber. Severe atherosclerotic plaque of the thoracic aorta. At least mild to moderate four-vessel coronary artery calcifications. Mild mild for annular calcifications. Mediastinum/Nodes: Several enlarged mediastinal lymph nodes with as an example a stable 1.3 cm right paratracheal lymph node (5:53). Also interval development of a 1.3 cm precarinal lymph node (5:66). No hilar or axillary lymph nodes. Thyroid  gland, trachea, and esophagus demonstrate no significant findings. Lungs/Pleura: Moderate paraseptal emphysematous changes. Left upper lung consolidation as well as surrounding ground-glass airspace opacities. Poorly visualized 2.1 cm nodular-like density within the left lower lobe could represent interval decrease in size of a prior noted pulmonary nodule (7:55). Previously noted 5 mm right lower lobe nodule not well visualized on this study. No pulmonary nodule or definite pulmonary mass. Bilateral, left greater than right, trace  to small volume pleural effusions. No pneumothorax. Musculoskeletal: No chest wall abnormality. No suspicious lytic or blastic osseous lesions. No acute displaced fracture. Multilevel degenerative changes of the spine. Review of the MIP images confirms the above findings. CT ABDOMEN and PELVIS FINDINGS Hepatobiliary: No focal liver abnormality. Status post cholecystectomy. Persistent enlargement of the common bile duct measuring up to 1.7 cm as well as mild central intrahepatic biliary ductal dilatation. Pancreas: No focal lesion. Normal pancreatic contour. No surrounding inflammatory changes. No main pancreatic ductal dilatation. Spleen: Normal in size without focal abnormality. Adrenals/Urinary Tract: Redemonstration of a grossly stable in size 2.5 x 1.3 cm left adrenal gland nodule with a density of 101 Hounsfield units. No right adrenal gland nodule. Bilateral kidneys enhance symmetrically. Redemonstration of a stable 1.6 cm parapelvic cyst on the left. Subcentimeter hypodensities are too small to characterize. No hydronephrosis. No hydroureter. The urinary bladder is unremarkable. Stomach/Bowel: Stomach is within normal limits. No evidence of bowel wall thickening or dilatation. Scattered colonic diverticulosis. The appendix is not definitely identified. Vascular/Lymphatic: No abdominal aorta or iliac aneurysm. Severe atherosclerotic plaque of the aorta and its branches. No  abdominal, pelvic, or inguinal lymphadenopathy. Reproductive: The uterus and bilateral adnexal regions are unremarkable. Other: No intraperitoneal free fluid. No intraperitoneal free gas. No organized fluid collection. Musculoskeletal: Small umbilical hernia containing the anti-mesenteric wall of a short loop of transverse colon. Associated abdominal defect of 3.5 x 2.5 cm. Redemonstration of L4 through S1 posterolateral fusion in laminectomy. No suspicious lytic or blastic osseous lesions. No acute displaced fracture. Multilevel severe degenerative changes of the spine. Review of the MIP images confirms the above findings. IMPRESSION: 1. Left upper lobe findings suggestive of infection/inflammation with underlying malignancy not excluded. At least a followup PA and lateral chest X-ray is recommended in 3-4 weeks following trial of antibiotic therapy to ensure resolution and exclude underlying malignancy. 2. Poorly visualized 2.1 cm nodular-like density within the left lower lobe could represent interval decrease in size of a prior noted pulmonary nodule that represents a known primary bronchogenic neoplasm. 3. Interval development of bilateral trace to small volume pleural effusion. 4. Indeterminate mediastinal lymphadenopathy that may be reactive in etiology. 5. Stable in size 2.5 cm left adrenal gland nodule. 6. Other imaging findings of potential clinical significance: Stable common bile duct dilatation - can be seen in the post cholecystectomy setting as in this patient. Small Richter umbilical hernia containing short loop of transverse colon - no findings suggest ischemia or bowel obstruction. Colonic diverticulosis with no acute diverticulitis. Aortic Atherosclerosis (ICD10-I70.0) and Emphysema (ICD10-J43.9). Electronically Signed   By: Iven Finn M.D.   On: 09/01/2020 16:19   CT Abdomen Pelvis W Contrast  Result Date: 09/01/2020 CLINICAL DATA:  Cough, epigastric pain, nonlocalized abdominal pain.  EXAM: CT ANGIOGRAPHY CHEST CT ABDOMEN AND PELVIS WITH CONTRAST TECHNIQUE: Multidetector CT imaging of the chest was performed using the standard protocol during bolus administration of intravenous contrast. Multiplanar CT image reconstructions and MIPs were obtained to evaluate the vascular anatomy. Multidetector CT imaging of the abdomen and pelvis was performed using the standard protocol during bolus administration of intravenous contrast. CONTRAST:  16mL OMNIPAQUE IOHEXOL 350 MG/ML SOLN COMPARISON:  PET CT 01/16/2020. FINDINGS: CTA CHEST FINDINGS Cardiovascular: Satisfactory opacification of the pulmonary arteries to the segmental level. No evidence of pulmonary embolism. The left atria is enlarged in size. Otherwise remaining of the heart is normal in size. No significant pericardial effusion. Aortic valve replacement. The thoracic aorta is normal in caliber.  Severe atherosclerotic plaque of the thoracic aorta. At least mild to moderate four-vessel coronary artery calcifications. Mild mild for annular calcifications. Mediastinum/Nodes: Several enlarged mediastinal lymph nodes with as an example a stable 1.3 cm right paratracheal lymph node (5:53). Also interval development of a 1.3 cm precarinal lymph node (5:66). No hilar or axillary lymph nodes. Thyroid gland, trachea, and esophagus demonstrate no significant findings. Lungs/Pleura: Moderate paraseptal emphysematous changes. Left upper lung consolidation as well as surrounding ground-glass airspace opacities. Poorly visualized 2.1 cm nodular-like density within the left lower lobe could represent interval decrease in size of a prior noted pulmonary nodule (7:55). Previously noted 5 mm right lower lobe nodule not well visualized on this study. No pulmonary nodule or definite pulmonary mass. Bilateral, left greater than right, trace to small volume pleural effusions. No pneumothorax. Musculoskeletal: No chest wall abnormality. No suspicious lytic or blastic  osseous lesions. No acute displaced fracture. Multilevel degenerative changes of the spine. Review of the MIP images confirms the above findings. CT ABDOMEN and PELVIS FINDINGS Hepatobiliary: No focal liver abnormality. Status post cholecystectomy. Persistent enlargement of the common bile duct measuring up to 1.7 cm as well as mild central intrahepatic biliary ductal dilatation. Pancreas: No focal lesion. Normal pancreatic contour. No surrounding inflammatory changes. No main pancreatic ductal dilatation. Spleen: Normal in size without focal abnormality. Adrenals/Urinary Tract: Redemonstration of a grossly stable in size 2.5 x 1.3 cm left adrenal gland nodule with a density of 101 Hounsfield units. No right adrenal gland nodule. Bilateral kidneys enhance symmetrically. Redemonstration of a stable 1.6 cm parapelvic cyst on the left. Subcentimeter hypodensities are too small to characterize. No hydronephrosis. No hydroureter. The urinary bladder is unremarkable. Stomach/Bowel: Stomach is within normal limits. No evidence of bowel wall thickening or dilatation. Scattered colonic diverticulosis. The appendix is not definitely identified. Vascular/Lymphatic: No abdominal aorta or iliac aneurysm. Severe atherosclerotic plaque of the aorta and its branches. No abdominal, pelvic, or inguinal lymphadenopathy. Reproductive: The uterus and bilateral adnexal regions are unremarkable. Other: No intraperitoneal free fluid. No intraperitoneal free gas. No organized fluid collection. Musculoskeletal: Small umbilical hernia containing the anti-mesenteric wall of a short loop of transverse colon. Associated abdominal defect of 3.5 x 2.5 cm. Redemonstration of L4 through S1 posterolateral fusion in laminectomy. No suspicious lytic or blastic osseous lesions. No acute displaced fracture. Multilevel severe degenerative changes of the spine. Review of the MIP images confirms the above findings. IMPRESSION: 1. Left upper lobe findings  suggestive of infection/inflammation with underlying malignancy not excluded. At least a followup PA and lateral chest X-ray is recommended in 3-4 weeks following trial of antibiotic therapy to ensure resolution and exclude underlying malignancy. 2. Poorly visualized 2.1 cm nodular-like density within the left lower lobe could represent interval decrease in size of a prior noted pulmonary nodule that represents a known primary bronchogenic neoplasm. 3. Interval development of bilateral trace to small volume pleural effusion. 4. Indeterminate mediastinal lymphadenopathy that may be reactive in etiology. 5. Stable in size 2.5 cm left adrenal gland nodule. 6. Other imaging findings of potential clinical significance: Stable common bile duct dilatation - can be seen in the post cholecystectomy setting as in this patient. Small Richter umbilical hernia containing short loop of transverse colon - no findings suggest ischemia or bowel obstruction. Colonic diverticulosis with no acute diverticulitis. Aortic Atherosclerosis (ICD10-I70.0) and Emphysema (ICD10-J43.9). Electronically Signed   By: Iven Finn M.D.   On: 09/01/2020 16:19    Procedures .Critical Care Performed by: Luna Fuse, MD  Authorized by: Luna Fuse, MD   Critical care provider statement:    Critical care time (minutes):  45   Critical care time was exclusive of:  Separately billable procedures and treating other patients   Critical care was necessary to treat or prevent imminent or life-threatening deterioration of the following conditions:  Respiratory failure   Critical care was time spent personally by me on the following activities:  Discussions with consultants, evaluation of patient's response to treatment, examination of patient, ordering and performing treatments and interventions, ordering and review of laboratory studies, ordering and review of radiographic studies, pulse oximetry, re-evaluation of patient's condition,  obtaining history from patient or surrogate and review of old charts   Care discussed with: admitting provider     (including critical care time)  Medications Ordered in ED Medications  cefTRIAXone (ROCEPHIN) 1 g in sodium chloride 0.9 % 100 mL IVPB (has no administration in time range)  doxycycline (VIBRA-TABS) tablet 100 mg (has no administration in time range)  amLODipine (NORVASC) tablet 10 mg (has no administration in time range)  aspirin chewable tablet 81 mg (has no administration in time range)  atorvastatin (LIPITOR) tablet 40 mg (has no administration in time range)  apixaban (ELIQUIS) tablet 5 mg (has no administration in time range)  losartan (COZAAR) tablet 100 mg (has no administration in time range)  metoprolol tartrate (LOPRESSOR) tablet 50 mg (has no administration in time range)  traMADol (ULTRAM) tablet 50 mg (has no administration in time range)  furosemide (LASIX) injection 40 mg (40 mg Intravenous Given 09/01/20 1436)  morphine 2 MG/ML injection 2 mg (2 mg Intravenous Given 09/01/20 1440)  iohexol (OMNIPAQUE) 350 MG/ML injection 100 mL (100 mLs Intravenous Contrast Given 09/01/20 1544)  cefTRIAXone (ROCEPHIN) 1 g in sodium chloride 0.9 % 100 mL IVPB (0 g Intravenous Stopped 09/01/20 1813)    ED Course  I have reviewed the triage vital signs and the nursing notes.  Pertinent labs & imaging results that were available during my care of the patient were reviewed by me and considered in my medical decision making (see chart for details).    MDM Rules/Calculators/A&P                          She is hypoxic here about 86% on room air which is low.  Placed on 2 L nasal cannula satting 92% which is appropriate.  CT angio of chest pursued no evidence of pulm embolism noted.  Pulmonary lesion-noted possible pneumonia versus inflammatory lesion, patient started on broad-spectrum antibiotics after blood culture sent.  CT abdomen pelvis shows Richter hernia with transverse colon  abutting it.  Requesting surgical consultation as well.  Patient admitted to the hospitalist team given IV Lasix given proBNP elevated greater than 1000.   Final Clinical Impression(s) / ED Diagnoses Final diagnoses:  Hypoxemia  Community acquired pneumonia, unspecified laterality  Abdominal hernia without obstruction and without gangrene, recurrence not specified, unspecified hernia type    Rx / DC Orders ED Discharge Orders    None       Luna Fuse, MD 09/01/20 1646    Luna Fuse, MD 09/01/20 2018

## 2020-09-02 DIAGNOSIS — J449 Chronic obstructive pulmonary disease, unspecified: Secondary | ICD-10-CM

## 2020-09-02 DIAGNOSIS — I4819 Other persistent atrial fibrillation: Secondary | ICD-10-CM

## 2020-09-02 DIAGNOSIS — I10 Essential (primary) hypertension: Secondary | ICD-10-CM

## 2020-09-02 DIAGNOSIS — E11638 Type 2 diabetes mellitus with other oral complications: Secondary | ICD-10-CM

## 2020-09-02 DIAGNOSIS — E785 Hyperlipidemia, unspecified: Secondary | ICD-10-CM

## 2020-09-02 DIAGNOSIS — Z952 Presence of prosthetic heart valve: Secondary | ICD-10-CM

## 2020-09-02 DIAGNOSIS — J441 Chronic obstructive pulmonary disease with (acute) exacerbation: Secondary | ICD-10-CM

## 2020-09-02 LAB — BASIC METABOLIC PANEL
Anion gap: 12 (ref 5–15)
BUN: 12 mg/dL (ref 8–23)
CO2: 25 mmol/L (ref 22–32)
Calcium: 8.9 mg/dL (ref 8.9–10.3)
Chloride: 102 mmol/L (ref 98–111)
Creatinine, Ser: 0.77 mg/dL (ref 0.44–1.00)
GFR, Estimated: >60 mL/min (ref >60)
Glucose, Bld: 122 mg/dL — ABNORMAL HIGH (ref 70–99)
Potassium: 3 mmol/L — ABNORMAL LOW (ref 3.5–5.1)
Sodium: 139 mmol/L (ref 135–145)

## 2020-09-02 LAB — CBC
HCT: 36 % (ref 36.0–46.0)
Hemoglobin: 11.2 g/dL — ABNORMAL LOW (ref 12.0–15.0)
MCH: 28.4 pg (ref 26.0–34.0)
MCHC: 31.1 g/dL (ref 30.0–36.0)
MCV: 91.4 fL (ref 80.0–100.0)
Platelets: 231 10*3/uL (ref 150–400)
RBC: 3.94 MIL/uL (ref 3.87–5.11)
RDW: 14.9 % (ref 11.5–15.5)
WBC: 6.5 10*3/uL (ref 4.0–10.5)
nRBC: 0 % (ref 0.0–0.2)

## 2020-09-02 LAB — GLUCOSE, CAPILLARY: Glucose-Capillary: 164 mg/dL — ABNORMAL HIGH (ref 70–99)

## 2020-09-02 LAB — HEMOGLOBIN A1C
Hgb A1c MFr Bld: 5.9 % — ABNORMAL HIGH (ref 4.8–5.6)
Mean Plasma Glucose: 122.63 mg/dL

## 2020-09-02 MED ORDER — POTASSIUM CHLORIDE 20 MEQ PO PACK
40.0000 meq | PACK | ORAL | Status: AC
Start: 2020-09-02 — End: 2020-09-02
  Administered 2020-09-02 (×2): 40 meq via ORAL
  Filled 2020-09-02 (×2): qty 2

## 2020-09-02 MED ORDER — INSULIN ASPART 100 UNIT/ML ~~LOC~~ SOLN
0.0000 [IU] | Freq: Three times a day (TID) | SUBCUTANEOUS | Status: DC
  Administered 2020-09-02 – 2020-09-03 (×2): 2 [IU] via SUBCUTANEOUS

## 2020-09-02 MED ORDER — ONDANSETRON HCL 4 MG/2ML IJ SOLN
4.0000 mg | Freq: Four times a day (QID) | INTRAMUSCULAR | Status: DC | PRN
  Administered 2020-09-02 – 2020-09-04 (×2): 4 mg via INTRAVENOUS
  Filled 2020-09-02 (×2): qty 2

## 2020-09-02 MED ORDER — ATORVASTATIN CALCIUM 40 MG PO TABS
40.0000 mg | ORAL_TABLET | Freq: Every day | ORAL | Status: DC
  Administered 2020-09-02 – 2020-09-03 (×2): 40 mg via ORAL
  Filled 2020-09-02 (×3): qty 1

## 2020-09-02 NOTE — Progress Notes (Addendum)
PROGRESS NOTE    Tara Fields  OZD:[id number] DOB: 1927/10/16 DOA: 09/01/2020 PCP: [name], NP    Brief Narrative:  Tara Fields was admitted to the hospital working diagnosis of acute hypoxic respiratory failure secondary to community-acquired pneumonia.  87 year old female past medical history of bilateral lung nodules/masses, aortic stenosis, coronary artery disease, COPD, type 2 diabetes mellitus, diverticulosis, GERD, hypertension and dyslipidemia. Reported 3 to 4 days of epigastric abdominal pain.  On initial physical examination blood pressure 150/85, heart rate 106, respiratory rate 16, oxygen saturation 86-87%. Her lungs are clear to auscultation bilaterally, heart S1-S2 compressed and rhythmic, soft abdomen, no lower extremity edema.  Sodium 141, potassium 3.6, chloride 105, bicarb 23, glucose 142, BUN 13, creatinine 0.82, white count 6.1, hemoglobin 11.5, hematocrit 37.2, platelets 236, BNP 1304, troponin I 9,-9. SARS COVID-19 negative. Urinalysis 6-10 white cells, specific gravity 1.019.  Chest radiograph with bilateral interstitial infiltrates, left upper lobe/left lower lobe. CT chest, left upper lobe infiltrate, 2.1 cm nodule left lower lobe. Bilateral pleural effusions.  EKG 91 bpm, left axis deviation, left bundle branch block, atrial fibrillation rhythm, V1-V3 Q waves/J-point elevation, no significant ST segment changes. Lateral T wave inversions.  Patient responding well to medical therapy, mean off to room air with good toleration.   Assessment & Plan:   Principal Problem:   Acute respiratory failure with hypoxia (HCC) Active Problems:   Essential hypertension   Type II diabetes mellitus (HCC)   S/P TAVR (transcatheter aortic valve replacement)   Other persistent atrial fibrillation (HCC)   COPD (chronic obstructive pulmonary disease) (HCC)   Dyslipidemia   1. Acute hypoxic respiratory failure due to multilobar community-acquired  pneumonia. Patient this am is on room air, dyspnea has been improving, intermittent cough.  Wbc is 6,5, cultures, with no growth.  Continue medical therapy with ceftriaxone and doxycyline.  Manually meassured Qt is 0.44, left bundle branch block gives impression of longer Qtc Monitor oxymetry, follow with PT and OT evaluation.  If continue to improve possible dc back to SNF in am.   2. Atrial fibrillation rate control with metoprolol, continue anticoagulation with apixaban.   3. HTN/ diastolic heart failure with acute exacerbation/ cardiogenic pulmonary edema with bilateral pleural effusions. Continue blood pressure control with amlodipine, losartan and metoprolol.  Patient received one dose of furosemide on admission.   Continue close monitoring of blood pressure, hold on further diuresis for now  Old records personally reviewed, echocardiogram from 2016 with a preserved LV systolic function and no significant valvular disease. Will repeat echo on this admission, to assess LV function and valves.   4. COPD/ lung nodules.  No clinical sings of exacerbation, follow with pulmonary as outpatient for lung nodules.   5. T2DM with Dyslipidemia. Fasting glucose this am 122,. Will add insulin sliding sclae for glucose cover and monitoring.   6. Hypokalemia. Stable renal function with serum cr at 0.77, K is 3,0 and bicarbonate at 25. Will add 80 meq Kcl in 2 divided doses, follow up on renal function in am, hold on further diuresis for now.   7. Umbilical hernia. No incarceration, no indication for surgery.    Status is: Inpatient  Remains inpatient appropriate because:Inpatient level of care appropriate due to severity of illness   Dispo: The patient is from: SNF              Anticipated d/c is to: SNF              Anticipated d/c date is:  1 day              Patient currently is not medically stable to d/c. Possible dc tp SNF in am if continue to improve.        DVT  prophylaxis: apixaban   Code Status:   full  Family Communication:  No family at the bedside, I was not able to reach her son over the phone, left a voicemail.     Antimicrobials:   Doxycycline  Ceftriaxone     Subjective: Patient is feeling better, dyspnea has been improving, continue with intermittent cough, no nausea or vomiting.   Objective: Vitals:   09/01/20 2058 09/02/20 0010 09/02/20 0520 09/02/20 1317  BP: 140/60 131/72 136/77 129/65  Pulse: 87 76 80 91  Resp: 20 20 18    Temp: (!) 97.3 F (36.3 C) 97.9 F (36.6 C) 97.7 F (36.5 C) 98.3 F (36.8 C)  TempSrc: Oral Oral Oral Oral  SpO2: 97% 98% 96% 94%  Weight:      Height:        Intake/Output Summary (Last 24 hours) at 09/02/2020 1330 Last data filed at 09/02/2020 1610 Gross per 24 hour  Intake 340 ml  Output 700 ml  Net -360 ml   Filed Weights   09/01/20 1045  Weight: 72.1 kg    Examination:   General: Not in pain or dyspnea, deconditioned  Neurology: Awake and alert, non focal  E ENT: mild pallor, no icterus, oral mucosa moist Cardiovascular: No JVD. S1-S2 present, rhythmic, no gallops, rubs, or murmurs. No lower extremity edema. Pulmonary: positive breath sounds bilaterally, with no wheezing, rhonchi or rales. Gastrointestinal. Abdomen soft and non tender Skin. No rashes Musculoskeletal: no joint deformities     Data Reviewed: I have personally reviewed following labs and imaging studies  CBC: Recent Labs  Lab 09/01/20 1132 09/02/20 0156  WBC 6.1 6.5  HGB 11.5* 11.2*  HCT 37.2 36.0  MCV 93.5 91.4  PLT 236 960   Basic Metabolic Panel: Recent Labs  Lab 09/01/20 1132 09/02/20 0156  NA 141 139  K 3.6 3.0*  CL 105 102  CO2 23 25  GLUCOSE 142* 122*  BUN 13 12  CREATININE 0.82 0.77  CALCIUM 9.0 8.9   GFR: Estimated Creatinine Clearance: 41.4 mL/min (by C-G formula based on SCr of 0.77 mg/dL). Liver Function Tests: Recent Labs  Lab 09/01/20 1132  AST 21  ALT 15  ALKPHOS 40   BILITOT 1.0  PROT 6.4*  ALBUMIN 3.5   Recent Labs  Lab 09/01/20 1132  LIPASE 18   No results for input(s): AMMONIA in the last 168 hours. Coagulation Profile: No results for input(s): INR, PROTIME in the last 168 hours. Cardiac Enzymes: No results for input(s): CKTOTAL, CKMB, CKMBINDEX, TROPONINI in the last 168 hours. BNP (last 3 results) Recent Labs    11/23/19 1432  PROBNP 519.0*   HbA1C: No results for input(s): HGBA1C in the last 72 hours. CBG: No results for input(s): GLUCAP in the last 168 hours. Lipid Profile: No results for input(s): CHOL, HDL, LDLCALC, TRIG, CHOLHDL, LDLDIRECT in the last 72 hours. Thyroid Function Tests: No results for input(s): TSH, T4TOTAL, FREET4, T3FREE, THYROIDAB in the last 72 hours. Anemia Panel: No results for input(s): VITAMINB12, FOLATE, FERRITIN, TIBC, IRON, RETICCTPCT in the last 72 hours.    Radiology Studies: I have reviewed all of the imaging during this hospital visit personally     Scheduled Meds: . amLODipine  10 mg Oral Daily  .  apixaban  5 mg Oral BID  . aspirin  81 mg Oral Daily  . atorvastatin  40 mg Oral Daily  . doxycycline  100 mg Oral Q12H  . losartan  100 mg Oral Daily  . metoprolol tartrate  50 mg Oral BID   Continuous Infusions: . cefTRIAXone (ROCEPHIN)  IV       LOS: 1 day        Shonique Pelphrey Gerome Apley, MD

## 2020-09-03 LAB — CBC WITH DIFFERENTIAL/PLATELET
Abs Immature Granulocytes: 0.01 10*3/uL (ref 0.00–0.07)
Basophils Absolute: 0 10*3/uL (ref 0.0–0.1)
Basophils Relative: 1 %
Eosinophils Absolute: 0.1 10*3/uL (ref 0.0–0.5)
Eosinophils Relative: 2 %
HCT: 32.2 % — ABNORMAL LOW (ref 36.0–46.0)
Hemoglobin: 10.7 g/dL — ABNORMAL LOW (ref 12.0–15.0)
Immature Granulocytes: 0 %
Lymphocytes Relative: 17 %
Lymphs Abs: 0.8 10*3/uL (ref 0.7–4.0)
MCH: 30.1 pg (ref 26.0–34.0)
MCHC: 33.2 g/dL (ref 30.0–36.0)
MCV: 90.4 fL (ref 80.0–100.0)
Monocytes Absolute: 0.6 10*3/uL (ref 0.1–1.0)
Monocytes Relative: 12 %
Neutro Abs: 3.4 10*3/uL (ref 1.7–7.7)
Neutrophils Relative %: 68 %
Platelets: 219 10*3/uL (ref 150–400)
RBC: 3.56 MIL/uL — ABNORMAL LOW (ref 3.87–5.11)
RDW: 15 % (ref 11.5–15.5)
WBC: 4.9 10*3/uL (ref 4.0–10.5)
nRBC: 0 % (ref 0.0–0.2)

## 2020-09-03 LAB — BASIC METABOLIC PANEL
Anion gap: 9 (ref 5–15)
BUN: 19 mg/dL (ref 8–23)
CO2: 25 mmol/L (ref 22–32)
Calcium: 8.8 mg/dL — ABNORMAL LOW (ref 8.9–10.3)
Chloride: 105 mmol/L (ref 98–111)
Creatinine, Ser: 0.86 mg/dL (ref 0.44–1.00)
GFR, Estimated: >60 mL/min (ref >60)
Glucose, Bld: 112 mg/dL — ABNORMAL HIGH (ref 70–99)
Potassium: 3.6 mmol/L (ref 3.5–5.1)
Sodium: 139 mmol/L (ref 135–145)

## 2020-09-03 LAB — GLUCOSE, CAPILLARY
Glucose-Capillary: 126 mg/dL — ABNORMAL HIGH (ref 70–99)
Glucose-Capillary: 126 mg/dL — ABNORMAL HIGH (ref 70–99)
Glucose-Capillary: 131 mg/dL — ABNORMAL HIGH (ref 70–99)
Glucose-Capillary: 193 mg/dL — ABNORMAL HIGH (ref 70–99)
Glucose-Capillary: 98 mg/dL (ref 70–99)

## 2020-09-03 NOTE — Progress Notes (Signed)
PROGRESS NOTE  Tara Fields XHB:[id number] DOB: 12/13/1927 DOA: 09/01/2020 PCP: [name], NP  Brief History   [name] was admitted to the hospital working diagnosis of acute hypoxic respiratory failure secondary to community-acquired pneumonia.  87 year old female past medical history of bilateral lung nodules/masses, aortic stenosis, coronary artery disease, COPD, type 2 diabetes mellitus, diverticulosis, GERD, hypertension and dyslipidemia. Reported 3 to 4 days of epigastric abdominal pain.  On initial physical examination blood pressure 150/85, heart rate 106, respiratory rate 16, oxygen saturation 86-87%. Her lungs are clear to auscultation bilaterally, heart S1-S2 compressed and rhythmic, soft abdomen, no lower extremity edema.  Sodium 141, potassium 3.6, chloride 105, bicarb 23, glucose 142, BUN 13, creatinine 0.82, white count 6.1, hemoglobin 11.5, hematocrit 37.2, platelets 236, BNP 1304, troponin I 9,-9. SARS COVID-19 negative. Urinalysis 6-10 white cells, specific gravity 1.019.  Chest radiograph with bilateral interstitial infiltrates, left upper lobe/left lower lobe. CT chest, left upper lobe infiltrate, 2.1 cm nodule left lower lobe. Bilateral pleural effusions.  EKG 91 bpm, left axis deviation, left bundle branch block, atrial fibrillation rhythm, V1-V3 Q waves/J-point elevation, no significant ST segment changes. Lateral T wave inversions.  Patient responding well to medical therapy, mean off to room air with good toleration.   Consultants  . None  Procedures  . None  Antibiotics   Anti-infectives (From admission, onward)   Start     Dose/Rate Route Frequency Ordered Stop   09/02/20 1700  cefTRIAXone (ROCEPHIN) 1 g in sodium chloride 0.9 % 100 mL IVPB        1 g 200 mL/hr over 30 Minutes Intravenous Every 24 hours 09/01/20 1713 09/06/20 1659   09/02/20 1630  azithromycin (ZITHROMAX) 500 mg in sodium chloride 0.9 % 250 mL IVPB  Status:   Discontinued        500 mg 250 mL/hr over 60 Minutes Intravenous Every 24 hours 09/01/20 1713 09/01/20 1726   09/01/20 1730  doxycycline (VIBRA-TABS) tablet 100 mg        100 mg Oral Every 12 hours 09/01/20 1726     09/01/20 1630  cefTRIAXone (ROCEPHIN) 1 g in sodium chloride 0.9 % 100 mL IVPB        1 g 200 mL/hr over 30 Minutes Intravenous  Once 09/01/20 1627 09/01/20 1813   09/01/20 1630  azithromycin (ZITHROMAX) 500 mg in sodium chloride 0.9 % 250 mL IVPB  Status:  Discontinued        500 mg 250 mL/hr over 60 Minutes Intravenous  Once 09/01/20 1627 09/01/20 1726    .  Subjective  The patient is sitting up at bedside eating lunch. No new complaints.  Objective   Vitals:  Vitals:   09/03/20 0429 09/03/20 1742  BP: (!) 143/76 (!) 123/59  Pulse: 91 85  Resp: 18 20  Temp: 98 F (36.7 C) 98.2 F (36.8 C)  SpO2: 91% 95%   Exam:  Constitutional:  . The patient is awake, alert, and oriented x 3. No acute distress. Respiratory:  . No increased work of breathing. . No wheezes, rales, or rhonchi . No tactile fremitus Cardiovascular:  . Regular rate and rhythm . No murmurs, ectopy, or gallups. . No lateral PMI. No thrills. Abdomen:  . Abdomen is soft, non-tender, non-distended . No hernias, masses, or organomegaly . Normoactive bowel sounds.  Musculoskeletal:  . No cyanosis, clubbing, or edema Skin:  . No rashes, lesions, ulcers . palpation of skin: no induration or nodules Neurologic:  . CN 2-12 intact .  Sensation all 4 extremities intact Psychiatric:  . Mental status o Mood, affect appropriate o Orientation to person, place, time  . judgment and insight appear intact  I have personally reviewed the following:   Today's Data  . Vitals, BMP, CBC  Micro Data  . Blood culture x 2  Imaging  . CT Abdomen and Pelvis . CTA chest . CXR  Cardiology Data  . EKG  Scheduled Meds: . amLODipine  10 mg Oral Daily  . apixaban  5 mg Oral BID  . aspirin  81 mg Oral  Daily  . atorvastatin  40 mg Oral Daily  . doxycycline  100 mg Oral Q12H  . insulin aspart  0-9 Units Subcutaneous TID WC  . losartan  100 mg Oral Daily  . metoprolol tartrate  50 mg Oral BID   Continuous Infusions: . cefTRIAXone (ROCEPHIN)  IV 1 g (09/02/20 1711)    Principal Problem:   Acute respiratory failure with hypoxia (HCC) Active Problems:   Essential hypertension   Type II diabetes mellitus (HCC)   S/P TAVR (transcatheter aortic valve replacement)   Other persistent atrial fibrillation (HCC)   COPD (chronic obstructive pulmonary disease) (HCC)   Dyslipidemia   LOS: 2 days   A & P  1. Acute hypoxic respiratory failure due to multilobar community-acquired pneumonia. Patient this am is on room air, dyspnea has been improving, intermittent cough.  Wbc is 6,5, cultures, with no growth.  Continue medical therapy with ceftriaxone and doxycyline.  Manually meassured Qt is 0.44, left bundle branch block gives impression of longer Qtc Monitor oxymetry, follow with PT and OT evaluation.  Today the patient is saturating at 91% on room air. Will check room air SaO2 with ambulation. D/C to home in am if she does well.   2. Atrial fibrillation rate control with metoprolol, continue anticoagulation with apixaban.   3. HTN/ diastolic heart failure with acute exacerbation/ cardiogenic pulmonary edema with bilateral pleural effusions. Continue blood pressure control with amlodipine, losartan and metoprolol.  Patient received one dose of furosemide on admission.   Continue close monitoring of blood pressure, hold on further diuresis for now  Old records personally reviewed, echocardiogram from 2016 with a preserved LV systolic function and no significant valvular disease. Will repeat echo on this admission, to assess LV function and valves.   4. COPD/ lung nodules.  No clinical sings of exacerbation, follow with pulmonary as outpatient for lung nodules.   5. T2DM with  Dyslipidemia. Fasting glucose this am 122,. Will add insulin sliding sclae for glucose cover and monitoring.   6. Hypokalemia. Stable renal function with serum cr at 0.77, K is 3,0 and bicarbonate at 25. Will add 80 meq Kcl in 2 divided doses, follow up on renal function in am, hold on further diuresis for now.   7. Umbilical hernia. No incarceration, no indication for surgery.    Status is: Inpatient  Remains inpatient appropriate because:Inpatient level of care appropriate due to severity of illness   Dispo: The patient is from: SNF  Anticipated d/c is to: SNF  Anticipated d/c date is: 1 day  Patient currently is not medically stable to d/c.   DVT prophylaxis:      apixaban   Code Status:              full  Family Communication:       No family at the bedside, I was not able to reach her son over the phone, left a voicemail.  Disposition:  Status is: Inpatient  Remains inpatient appropriate because:Inpatient level of care appropriate due to severity of illness   Dispo: The patient is from: Home              Anticipated d/c is to: Home              Anticipated d/c date is: 1 day              Patient currently is not medically stable to d/c.  Braxxton Stoudt, DO [facility] Direct contact: see [email or web address]  7PM-7AM contact night coverage as above 09/03/2020, 7:06 PM  LOS: 2 days

## 2020-09-04 LAB — GLUCOSE, CAPILLARY
Glucose-Capillary: 143 mg/dL — ABNORMAL HIGH (ref 70–99)
Glucose-Capillary: 156 mg/dL — ABNORMAL HIGH (ref 70–99)

## 2020-09-04 MED ORDER — DOXYCYCLINE HYCLATE 100 MG PO TABS: 100.0000 mg | ORAL_TABLET | Freq: Two times a day (BID) | ORAL | 0 refills | Status: DC

## 2020-09-04 MED ORDER — CEPHALEXIN 500 MG PO CAPS: 500.0000 mg | ORAL_CAPSULE | Freq: Four times a day (QID) | ORAL | 0 refills | Status: AC

## 2020-09-04 NOTE — Discharge Summary (Signed)
Physician Discharge Summary  Tara Fields ZHY:[id number] DOB: 09/04/1927 DOA: 09/01/2020  PCP: [name], NP  Admit date: 09/01/2020 Discharge date: 09/04/2020  Recommendations for Outpatient Follow-up:  1. Discharge patient to home with intermittent supervision and [facility] PT/OT. 2. Follow up with PCP in 7-10 days. 3. Have chemistry checked at that visit and reported to PCP. 4. Pt to have follow up 2 view chest x-ray in 3-4 weeks to watch nodules to see if they have cleared with antibiotic therapy.  Discharge Diagnoses: Principal diagnosis is #1 1. Acute hypoxic respiratory failure due to multilobar community-acquired pneumonia 2. Atrial fibrillation 3. Hypertension 4. Diastolic heart failure in acute exacerbation 5. Cardiogenic pulmonary edema 6. Bilateral pleural effusions 7. COPD/Lung nodules 8. DM II with dyslipidemia 9. Hypokalemia 10. Umbilical hernia  Discharge Condition: Fair  Disposition: Home  Diet recommendation: Heart healthy  Filed Weights   09/01/20 1045  Weight: 72.1 kg    History of present illness: Tara Fields is a 87 y.o. female with medical history significant of bilateral lung nodule/masses who reports having undergone radiation earlier this month, aortic stenosis, CAD (coronary artery disease), COPD (chronic obstructive pulmonary disease) ([place]), Diabetes mellitus ([place]), Diverticulitis, GERD (gastroesophageal reflux disease), HTN (hypertension), Hyperlipidemia, Legally blind in left eye.  Patient presents with 3 to 4 days of epigastric abdominal pain pinching/squeezing in nature with associated shortness of breath.  Patient was noted to be hypoxic at intake, COVID 19 swab was negative however based on CTA of the chest a PE was ruled out but notable infection/inflammation of the left upper lobe, previous imaging notes a nodule too small for evaluation or intervention but being followed by pulmonology.  Patient also has a 2.1 cm nodule in the left  lower lobe being followed by pulmonology status post radiation as above.  Given abdominal complaints patient also had CT of the abdomen showing a small Richter umbilical hernia containing a small loop of transverse colon without any ischemia bowel obstruction or inflammation.  Patient also has notable diverticulosis on imaging without diverticulitis.  Unfortunately patient's review of systems is somewhat limited and despite multiple questions in regards to alleviating or exacerbating factors patient could not recall anything specific just epigastric pain and dyspnea with exertion and shortness of breath in general.  Patient indicates that her pain and shortness of breath resolved immediately with single dose of IV antibiotics in the emergency department.  At this point given patient's hypoxia concern for possible community-acquired pneumonia given inflammatory and atypical appearance on imaging we will admit patient for further evaluation and treatment.  Review of Systems: As per HPI above, otherwise denies nausea, vomiting, fever, chills, cough, sputum production, headache, constipation chest pain.   Hospital Course: 87 year old female past medical history of bilateral lung nodules/masses, aortic stenosis, coronary artery disease, COPD, type 2 diabetes mellitus, diverticulosis, GERD, hypertension and dyslipidemia. Reported 3 to 4 days of epigastric abdominal pain. On initial physical examination blood pressure 150/85, heart rate 106, respiratory rate 16, oxygen saturation 86-87%. Her lungs are clear to auscultation bilaterally, heart S1-S2 compressed and rhythmic, soft abdomen, no lower extremity edema.  Sodium 141, potassium 3.6, chloride 105, bicarb 23, glucose 142, BUN 13, creatinine 0.82, white count 6.1, hemoglobin 11.5, hematocrit 37.2, platelets 236, BNP 1304, troponin I 9,-9. SARS COVID-19 negative. Urinalysis 6-10 white cells, specific gravity 1.019.  Chest radiograph with bilateral  interstitial infiltrates, left upper lobe/left lower lobe. CT chest, left upper lobe infiltrate, 2.1 cm nodule left lower lobe. Bilateral pleural effusions.  EKG 91  bpm, left axis deviation, left bundle branch block, atrial fibrillation rhythm, V1-V3 Q waves/J-point elevation, no significant ST segment changes. Lateral T wave inversions.  Patient responding well to medical therapy and diuresis. She has been weaned off of oxygen to room air. On 09/03/2020 the patient was able to ambulate in the halls on room air while maintaining ag  Today's assessment: S: The patient is sitting up at bedside waiting for discharge. No new complaints. O: Vitals:  Vitals:   09/03/20 2152 09/04/20 0628  BP: (!) 125/57 138/80  Pulse: 85 94  Resp:  20  Temp: 98.1 F (36.7 C) 97.8 F (36.6 C)  SpO2: 95% 96%   Constitutional:   The patient is awake, alert, and oriented x 3. No acute distress. Respiratory:   No increased work of breathing.  No wheezes, rales, or rhonchi  No tactile fremitus Cardiovascular:   Regular rate and rhythm  No murmurs, ectopy, or gallups.  No lateral PMI. No thrills. Abdomen:   Abdomen is soft, non-tender, non-distended  No hernias, masses, or organomegaly  Normoactive bowel sounds.  Musculoskeletal:   No cyanosis, clubbing, or edema Skin:   No rashes, lesions, ulcers  palpation of skin: no induration or nodules Neurologic:   CN 2-12 intact  Sensation all 4 extremities intact Psychiatric:   Mental status ? Mood, affect appropriate ? Orientation to person, place, time   judgment and insight appear intact    Discharge Instructions  Discharge Instructions    Activity as tolerated - No restrictions   Complete by: As directed    Call MD for:  difficulty breathing, headache or visual disturbances   Complete by: As directed    Diet - low sodium heart healthy   Complete by: As directed    Discharge instructions   Complete by: As directed     Discharge patient to home with intermittent supervision and [facility] PT/OT. Follow up with PCP in 7-10 days. Have chemistry checked at that visit and reported to PCP. Pt to have follow up 2 view chest x-ray in 3-4 weeks to watch nodules to see if they have cleared with antibiotic therapy.   Increase activity slowly   Complete by: As directed      Allergies as of 09/04/2020      Reactions   Amlodipine Besy-benazepril Hcl Other (See Comments)   cough   Hctz [hydrochlorothiazide] Other (See Comments)   Diclofenac Sodium Diarrhea   Hydralazine    (Apresoline *ANTIHYPERTENSIVES*) HEPATITIS   Methylprednisolone    (Medrol (Pak) *CORTICOSTEROIDS*) STATES ANGER, BUT ALOT OF ENERGY      Medication List    STOP taking these medications   terazosin 10 MG capsule Commonly known as: HYTRIN     TAKE these medications   acetaminophen 325 MG tablet Commonly known as: TYLENOL Take 650 mg by mouth every 6 (six) hours as needed (SEVERE PAIN).   amLODipine 10 MG tablet Commonly known as: NORVASC Take 10 mg by mouth daily.   aspirin 81 MG chewable tablet Chew 1 tablet (81 mg total) by mouth daily.   atorvastatin 40 MG tablet Commonly known as: LIPITOR TAKE 1 TABLET (40 MG TOTAL) BY MOUTH DAILY AT 6 PM.   cephALEXin 500 MG capsule Commonly known as: KEFLEX Take 1 capsule (500 mg total) by mouth 4 (four) times daily for 7 days.   CO-Q 10 OMEGA-3 FISH OIL PO Take 400 mg by mouth daily.   doxycycline 100 MG tablet Commonly known as: VIBRA-TABS Take 1 tablet (  100 mg total) by mouth every 12 (twelve) hours.   Eliquis 5 MG Tabs tablet Generic drug: apixaban TAKE 1 TABLET BY MOUTH TWICE A DAY What changed: how much to take   fenofibrate 160 MG tablet TAKE 1 TABLET BY MOUTH EVERY DAY What changed: when to take this   ferrous gluconate 324 MG tablet Commonly known as: FERGON TAKE 1 TABLET BY MOUTH EVERY DAY WITH BREAKFAST What changed: See the new instructions.   losartan 100 MG  tablet Commonly known as: COZAAR TAKE 1 TABLET BY MOUTH EVERY DAY   metoprolol tartrate 50 MG tablet Commonly known as: LOPRESSOR Take 1 tablet (50 mg total) by mouth 2 (two) times daily.   ProAir HFA 108 (90 Base) MCG/ACT inhaler Generic drug: albuterol Use 2 inhalations every 4-6 hours as needed for wheezing or coughing.   traMADol 50 MG tablet Commonly known as: ULTRAM Take 50 mg by mouth 3 (three) times daily as needed for pain.   VITAMIN D-3 PO Take 5,000 Units by mouth 2 (two) times daily.   WOMENS 50+ MULTI VITAMIN/MIN PO Take 1 tablet by mouth daily. CENTRUM SILVER      Allergies  Allergen Reactions  . Amlodipine Besy-Benazepril Hcl Other (See Comments)    cough  . Hctz [Hydrochlorothiazide] Other (See Comments)  . Diclofenac Sodium Diarrhea  . Hydralazine     (Apresoline *ANTIHYPERTENSIVES*) HEPATITIS  . Methylprednisolone     (Medrol (Pak) *CORTICOSTEROIDS*) STATES ANGER, BUT ALOT OF ENERGY    The results of significant diagnostics from this hospitalization (including imaging, microbiology, ancillary and laboratory) are listed below for reference.    Significant Diagnostic Studies: DG Chest 1 View  Result Date: 09/01/2020 CLINICAL DATA:  Shortness of breath EXAM: CHEST  1 VIEW COMPARISON:  Chest CT August 22, 2019 and chest radiograph July 15, 2020 FINDINGS: Enlarged cardiac silhouette. Aortic atherosclerosis. Diffuse interstitial opacities. Similar patchy opacity in the left upper lobe, likely involving post radiation change. Probable small bilateral pleural effusions. IMPRESSION: Cardiomegaly with diffuse interstitial opacities, likely edema as well as probable small bilateral pleural effusions. Electronically Signed   By: Dahlia Bailiff MD   On: 09/01/2020 12:30   CT Chest Wo Contrast  Result Date: 08/21/2020 CLINICAL DATA:  Left lower lobe lung cancer status post radiation therapy. Restaging. EXAM: CT CHEST WITHOUT CONTRAST TECHNIQUE: Multidetector CT  imaging of the chest was performed following the standard protocol without IV contrast. COMPARISON:  01/16/2020 PET-CT. 12/27/2019 chest CT. FINDINGS: Cardiovascular: Moderate cardiomegaly, stable. Aortic valve prosthesis is in place. No significant pericardial effusion/thickening. Three-vessel coronary atherosclerosis. Atherosclerotic nonaneurysmal thoracic aorta. Stable dilated main pulmonary artery (3.5 cm diameter). Mediastinum/Nodes: No discrete thyroid nodules. Unremarkable esophagus. No pathologically enlarged axillary, mediastinal or hilar lymph nodes, noting limited sensitivity for the detection of hilar adenopathy on this noncontrast study. Lungs/Pleura: No pneumothorax. Trace dependent left pleural effusion. No right pleural effusion. Moderate centrilobular emphysema with diffuse bronchial wall thickening. Irregular solid 1.6 x 0.9 cm superior segment left lower lobe pulmonary nodule (series 5/image 37), decreased from 2.4 x 1.8 cm. Patchy consolidation and ground-glass opacity in the left upper lobe is new and compatible with evolving postradiation change. Mild diffuse interlobular septal thickening. No new significant pulmonary nodules. Upper abdomen: Cholecystectomy. Nonobstructing 3 mm upper left renal stone. Stable 2.3 cm left adrenal nodule with density 11 HU, previously characterized as an adenoma. Colonic diverticulosis. Musculoskeletal: No aggressive appearing focal osseous lesions. Marked thoracic spondylosis. IMPRESSION: 1. Interval decreased size of irregular solid superior segment left  lower lobe pulmonary nodule, compatible with response to therapy. Evolving postradiation change in the left upper lobe. 2. No evidence of metastatic disease in the chest. 3. Moderate cardiomegaly. Trace dependent left pleural effusion. Mild diffuse interlobular septal thickening, suggesting mild pulmonary edema. 4. Stable dilated main pulmonary artery, suggesting chronic pulmonary arterial hypertension. 5.  Stable left adrenal adenoma. 6. Nonobstructing left nephrolithiasis. 7. Aortic Atherosclerosis (ICD10-I70.0) and Emphysema (ICD10-J43.9). Electronically Signed   By: Ilona Sorrel M.D.   On: 08/21/2020 18:17   CT Angio Chest PE W/Cm &/Or Wo Cm  Result Date: 09/01/2020 CLINICAL DATA:  Cough, epigastric pain, nonlocalized abdominal pain. EXAM: CT ANGIOGRAPHY CHEST CT ABDOMEN AND PELVIS WITH CONTRAST TECHNIQUE: Multidetector CT imaging of the chest was performed using the standard protocol during bolus administration of intravenous contrast. Multiplanar CT image reconstructions and MIPs were obtained to evaluate the vascular anatomy. Multidetector CT imaging of the abdomen and pelvis was performed using the standard protocol during bolus administration of intravenous contrast. CONTRAST:  153mL OMNIPAQUE IOHEXOL 350 MG/ML SOLN COMPARISON:  PET CT 01/16/2020. FINDINGS: CTA CHEST FINDINGS Cardiovascular: Satisfactory opacification of the pulmonary arteries to the segmental level. No evidence of pulmonary embolism. The left atria is enlarged in size. Otherwise remaining of the heart is normal in size. No significant pericardial effusion. Aortic valve replacement. The thoracic aorta is normal in caliber. Severe atherosclerotic plaque of the thoracic aorta. At least mild to moderate four-vessel coronary artery calcifications. Mild mild for annular calcifications. Mediastinum/Nodes: Several enlarged mediastinal lymph nodes with as an example a stable 1.3 cm right paratracheal lymph node (5:53). Also interval development of a 1.3 cm precarinal lymph node (5:66). No hilar or axillary lymph nodes. Thyroid gland, trachea, and esophagus demonstrate no significant findings. Lungs/Pleura: Moderate paraseptal emphysematous changes. Left upper lung consolidation as well as surrounding ground-glass airspace opacities. Poorly visualized 2.1 cm nodular-like density within the left lower lobe could represent interval decrease in size  of a prior noted pulmonary nodule (7:55). Previously noted 5 mm right lower lobe nodule not well visualized on this study. No pulmonary nodule or definite pulmonary mass. Bilateral, left greater than right, trace to small volume pleural effusions. No pneumothorax. Musculoskeletal: No chest wall abnormality. No suspicious lytic or blastic osseous lesions. No acute displaced fracture. Multilevel degenerative changes of the spine. Review of the MIP images confirms the above findings. CT ABDOMEN and PELVIS FINDINGS Hepatobiliary: No focal liver abnormality. Status post cholecystectomy. Persistent enlargement of the common bile duct measuring up to 1.7 cm as well as mild central intrahepatic biliary ductal dilatation. Pancreas: No focal lesion. Normal pancreatic contour. No surrounding inflammatory changes. No main pancreatic ductal dilatation. Spleen: Normal in size without focal abnormality. Adrenals/Urinary Tract: Redemonstration of a grossly stable in size 2.5 x 1.3 cm left adrenal gland nodule with a density of 101 Hounsfield units. No right adrenal gland nodule. Bilateral kidneys enhance symmetrically. Redemonstration of a stable 1.6 cm parapelvic cyst on the left. Subcentimeter hypodensities are too small to characterize. No hydronephrosis. No hydroureter. The urinary bladder is unremarkable. Stomach/Bowel: Stomach is within normal limits. No evidence of bowel wall thickening or dilatation. Scattered colonic diverticulosis. The appendix is not definitely identified. Vascular/Lymphatic: No abdominal aorta or iliac aneurysm. Severe atherosclerotic plaque of the aorta and its branches. No abdominal, pelvic, or inguinal lymphadenopathy. Reproductive: The uterus and bilateral adnexal regions are unremarkable. Other: No intraperitoneal free fluid. No intraperitoneal free gas. No organized fluid collection. Musculoskeletal: Small umbilical hernia containing the anti-mesenteric wall of a short  loop of transverse colon.  Associated abdominal defect of 3.5 x 2.5 cm. Redemonstration of L4 through S1 posterolateral fusion in laminectomy. No suspicious lytic or blastic osseous lesions. No acute displaced fracture. Multilevel severe degenerative changes of the spine. Review of the MIP images confirms the above findings. IMPRESSION: 1. Left upper lobe findings suggestive of infection/inflammation with underlying malignancy not excluded. At least a followup PA and lateral chest X-ray is recommended in 3-4 weeks following trial of antibiotic therapy to ensure resolution and exclude underlying malignancy. 2. Poorly visualized 2.1 cm nodular-like density within the left lower lobe could represent interval decrease in size of a prior noted pulmonary nodule that represents a known primary bronchogenic neoplasm. 3. Interval development of bilateral trace to small volume pleural effusion. 4. Indeterminate mediastinal lymphadenopathy that may be reactive in etiology. 5. Stable in size 2.5 cm left adrenal gland nodule. 6. Other imaging findings of potential clinical significance: Stable common bile duct dilatation - can be seen in the post cholecystectomy setting as in this patient. Small Richter umbilical hernia containing short loop of transverse colon - no findings suggest ischemia or bowel obstruction. Colonic diverticulosis with no acute diverticulitis. Aortic Atherosclerosis (ICD10-I70.0) and Emphysema (ICD10-J43.9). Electronically Signed   By: Iven Finn M.D.   On: 09/01/2020 16:19   CT Abdomen Pelvis W Contrast  Result Date: 09/01/2020 CLINICAL DATA:  Cough, epigastric pain, nonlocalized abdominal pain. EXAM: CT ANGIOGRAPHY CHEST CT ABDOMEN AND PELVIS WITH CONTRAST TECHNIQUE: Multidetector CT imaging of the chest was performed using the standard protocol during bolus administration of intravenous contrast. Multiplanar CT image reconstructions and MIPs were obtained to evaluate the vascular anatomy. Multidetector CT imaging of the  abdomen and pelvis was performed using the standard protocol during bolus administration of intravenous contrast. CONTRAST:  143mL OMNIPAQUE IOHEXOL 350 MG/ML SOLN COMPARISON:  PET CT 01/16/2020. FINDINGS: CTA CHEST FINDINGS Cardiovascular: Satisfactory opacification of the pulmonary arteries to the segmental level. No evidence of pulmonary embolism. The left atria is enlarged in size. Otherwise remaining of the heart is normal in size. No significant pericardial effusion. Aortic valve replacement. The thoracic aorta is normal in caliber. Severe atherosclerotic plaque of the thoracic aorta. At least mild to moderate four-vessel coronary artery calcifications. Mild mild for annular calcifications. Mediastinum/Nodes: Several enlarged mediastinal lymph nodes with as an example a stable 1.3 cm right paratracheal lymph node (5:53). Also interval development of a 1.3 cm precarinal lymph node (5:66). No hilar or axillary lymph nodes. Thyroid gland, trachea, and esophagus demonstrate no significant findings. Lungs/Pleura: Moderate paraseptal emphysematous changes. Left upper lung consolidation as well as surrounding ground-glass airspace opacities. Poorly visualized 2.1 cm nodular-like density within the left lower lobe could represent interval decrease in size of a prior noted pulmonary nodule (7:55). Previously noted 5 mm right lower lobe nodule not well visualized on this study. No pulmonary nodule or definite pulmonary mass. Bilateral, left greater than right, trace to small volume pleural effusions. No pneumothorax. Musculoskeletal: No chest wall abnormality. No suspicious lytic or blastic osseous lesions. No acute displaced fracture. Multilevel degenerative changes of the spine. Review of the MIP images confirms the above findings. CT ABDOMEN and PELVIS FINDINGS Hepatobiliary: No focal liver abnormality. Status post cholecystectomy. Persistent enlargement of the common bile duct measuring up to 1.7 cm as well as mild  central intrahepatic biliary ductal dilatation. Pancreas: No focal lesion. Normal pancreatic contour. No surrounding inflammatory changes. No main pancreatic ductal dilatation. Spleen: Normal in size without focal abnormality. Adrenals/Urinary Tract: Redemonstration of  a grossly stable in size 2.5 x 1.3 cm left adrenal gland nodule with a density of 101 Hounsfield units. No right adrenal gland nodule. Bilateral kidneys enhance symmetrically. Redemonstration of a stable 1.6 cm parapelvic cyst on the left. Subcentimeter hypodensities are too small to characterize. No hydronephrosis. No hydroureter. The urinary bladder is unremarkable. Stomach/Bowel: Stomach is within normal limits. No evidence of bowel wall thickening or dilatation. Scattered colonic diverticulosis. The appendix is not definitely identified. Vascular/Lymphatic: No abdominal aorta or iliac aneurysm. Severe atherosclerotic plaque of the aorta and its branches. No abdominal, pelvic, or inguinal lymphadenopathy. Reproductive: The uterus and bilateral adnexal regions are unremarkable. Other: No intraperitoneal free fluid. No intraperitoneal free gas. No organized fluid collection. Musculoskeletal: Small umbilical hernia containing the anti-mesenteric wall of a short loop of transverse colon. Associated abdominal defect of 3.5 x 2.5 cm. Redemonstration of L4 through S1 posterolateral fusion in laminectomy. No suspicious lytic or blastic osseous lesions. No acute displaced fracture. Multilevel severe degenerative changes of the spine. Review of the MIP images confirms the above findings. IMPRESSION: 1. Left upper lobe findings suggestive of infection/inflammation with underlying malignancy not excluded. At least a followup PA and lateral chest X-ray is recommended in 3-4 weeks following trial of antibiotic therapy to ensure resolution and exclude underlying malignancy. 2. Poorly visualized 2.1 cm nodular-like density within the left lower lobe could  represent interval decrease in size of a prior noted pulmonary nodule that represents a known primary bronchogenic neoplasm. 3. Interval development of bilateral trace to small volume pleural effusion. 4. Indeterminate mediastinal lymphadenopathy that may be reactive in etiology. 5. Stable in size 2.5 cm left adrenal gland nodule. 6. Other imaging findings of potential clinical significance: Stable common bile duct dilatation - can be seen in the post cholecystectomy setting as in this patient. Small Richter umbilical hernia containing short loop of transverse colon - no findings suggest ischemia or bowel obstruction. Colonic diverticulosis with no acute diverticulitis. Aortic Atherosclerosis (ICD10-I70.0) and Emphysema (ICD10-J43.9). Electronically Signed   By: Iven Finn M.D.   On: 09/01/2020 16:19    Microbiology: Recent Results (from the past 240 hour(s))  Resp Panel by RT-PCR (Flu A&B, Covid) Nasopharyngeal Swab     Status: None   Collection Time: 09/01/20 10:58 AM   Specimen: Nasopharyngeal Swab; Nasopharyngeal(NP) swabs in vial transport medium  Result Value Ref Range Status   SARS Coronavirus 2 by RT PCR NEGATIVE NEGATIVE Final    Comment: (NOTE) SARS-CoV-2 target nucleic acids are NOT DETECTED.  The SARS-CoV-2 RNA is generally detectable in upper respiratory specimens during the acute phase of infection. The lowest concentration of SARS-CoV-2 viral copies this assay can detect is 138 copies/mL. A negative result does not preclude SARS-Cov-2 infection and should not be used as the sole basis for treatment or other patient management decisions. A negative result may occur with  improper specimen collection/handling, submission of specimen other than nasopharyngeal swab, presence of viral mutation(s) within the areas targeted by this assay, and inadequate number of viral copies(<138 copies/mL). A negative result must be combined with clinical observations, patient history, and  epidemiological information. The expected result is Negative.  Fact Sheet for Patients:  EntrepreneurPulse.com.au  Fact Sheet for Healthcare Providers:  IncredibleEmployment.be  This test is no t yet approved or cleared by the [place] FDA and  has been authorized for detection and/or diagnosis of SARS-CoV-2 by FDA under an Emergency Use Authorization (EUA). This EUA will remain  in effect (meaning this test can be  used) for the duration of the COVID-19 declaration under Section 564(b)(1) of the Act, 21 U.S.C.section 360bbb-3(b)(1), unless the authorization is terminated  or revoked sooner.       Influenza A by PCR NEGATIVE NEGATIVE Final   Influenza B by PCR NEGATIVE NEGATIVE Final    Comment: (NOTE) The Xpert Xpress SARS-CoV-2/FLU/RSV plus assay is intended as an aid in the diagnosis of influenza from Nasopharyngeal swab specimens and should not be used as a sole basis for treatment. Nasal washings and aspirates are unacceptable for Xpert Xpress SARS-CoV-2/FLU/RSV testing.  Fact Sheet for Patients: EntrepreneurPulse.com.au  Fact Sheet for Healthcare Providers: IncredibleEmployment.be  This test is not yet approved or cleared by the [place] FDA and has been authorized for detection and/or diagnosis of SARS-CoV-2 by FDA under an Emergency Use Authorization (EUA). This EUA will remain in effect (meaning this test can be used) for the duration of the COVID-19 declaration under Section 564(b)(1) of the Act, 21 U.S.C. section 360bbb-3(b)(1), unless the authorization is terminated or revoked.  Performed at [facility], [place] [address], [place], [place] [address]   Culture, blood (routine x 2)     Status: None (Preliminary result)   Collection Time: 09/01/20  5:10 PM   Specimen: BLOOD LEFT ARM  Result Value Ref Range Status   Specimen Description BLOOD LEFT ARM  Final   Special Requests    Final    BOTTLES DRAWN AEROBIC AND ANAEROBIC Blood Culture results may not be optimal due to an inadequate volume of blood received in culture bottles   Culture   Final    NO GROWTH 3 DAYS Performed at [facility], [place] [address], [place], [place] [address]    Report Status PENDING  Incomplete  Culture, blood (routine x 2)     Status: None (Preliminary result)   Collection Time: 09/01/20  5:15 PM   Specimen: BLOOD LEFT HAND  Result Value Ref Range Status   Specimen Description BLOOD LEFT HAND  Final   Special Requests   Final    BOTTLES DRAWN AEROBIC ONLY Blood Culture results may not be optimal due to an inadequate volume of blood received in culture bottles   Culture   Final    NO GROWTH 3 DAYS Performed at [facility], [place] [address], [place], [place] [address]    Report Status PENDING  Incomplete     Labs: Basic Metabolic Panel: Recent Labs  Lab 09/01/20 1132 09/02/20 0156 09/03/20 0144  NA 141 139 139  K 3.6 3.0* 3.6  CL 105 102 105  CO2 23 25 25   GLUCOSE 142* 122* 112*  BUN 13 12 19   CREATININE 0.82 0.77 0.86  CALCIUM 9.0 8.9 8.8*   Liver Function Tests: Recent Labs  Lab 09/01/20 1132  AST 21  ALT 15  ALKPHOS 40  BILITOT 1.0  PROT 6.4*  ALBUMIN 3.5   Recent Labs  Lab 09/01/20 1132  LIPASE 18   No results for input(s): AMMONIA in the last 168 hours. CBC: Recent Labs  Lab 09/01/20 1132 09/02/20 0156 09/03/20 0144  WBC 6.1 6.5 4.9  NEUTROABS  --   --  3.4  HGB 11.5* 11.2* 10.7*  HCT 37.2 36.0 32.2*  MCV 93.5 91.4 90.4  PLT 236 231 219   Cardiac Enzymes: No results for input(s): CKTOTAL, CKMB, CKMBINDEX, TROPONINI in the last 168 hours. BNP: BNP (last 3 results) Recent Labs    09/01/20 1223  BNP 1,304.5*  ProBNP (last 3 results) Recent Labs    11/23/19 1432  PROBNP 519.0*    CBG: Recent Labs  Lab 09/03/20 1154 09/03/20 1738 09/03/20 2148 09/04/20 0730 09/04/20 1136  GLUCAP 126* 131* 126* 143* 156*     Principal Problem:   Acute respiratory failure with hypoxia (HCC) Active Problems:   Essential hypertension   Type II diabetes mellitus (HCC)   S/P TAVR (transcatheter aortic valve replacement)   Other persistent atrial fibrillation (HCC)   COPD (chronic obstructive pulmonary disease) (HCC)   Dyslipidemia   Time coordinating discharge: 38 minutes.  Signed:        Angellica Maddison, DO [facility]  09/04/2020, 2:15 PM

## 2020-09-04 NOTE — Care Management Important Message (Signed)
Important Message  Patient Details  Name: Tara Fields MRN: [id number] Date of Birth: Sep 11, 1927   Medicare Important Message Given:  Yes     Shakthi Scipio 09/04/2020, 8:30 AM

## 2020-09-04 NOTE — Evaluation (Signed)
Occupational Therapy Evaluation and Discharge Patient Details Name: Tara Fields MRN: [id number] DOB: 1928-01-31 Today's Date: 09/04/2020    History of Present Illness 87 y.o. female with medical history significant of bilateral lung nodule/masses who reports having undergone radiation earlier this month, aortic stenosis, CAD, afib, COPD, DM, Diverticulitis, GERD, HTN, Hyperlipidemia, Legally blind in left eye presented 09/01/20 with abdominal pain and hypoxia due to CAP. +umbilical hernia   Clinical Impression   This 87 yo female admitted with above presents to acute OT at an overall S level using a RW. Feel she can safely discharge home with [place] and intermittent S or at least access to call someone if she needs A at her ALF. We will D/C from acute OT.    Follow Up Recommendations  Home health OT;Supervision - Intermittent    Equipment Recommendations  None recommended by OT       Precautions / Restrictions Precautions Precautions: Fall Restrictions Weight Bearing Restrictions: No      Mobility Bed Mobility Overal bed mobility: Independent                  Transfers Overall transfer level: Needs assistance Equipment used: Rolling walker (2 wheeled) Transfers: Sit to/from Stand Sit to Stand: Supervision         General transfer comment: ambualting in room S with RW    Balance Overall balance assessment: Mild deficits observed, not formally tested                                         ADL either performed or assessed with clinical judgement   ADL                                         General ADL Comments: At an overall S level due to pt reporting she has not been moving around less than normal--she was safe with me in room with RW with only one vc to stay closer to RW (she is used to using a rollator)     Vision Patient Visual Report: No change from baseline              Pertinent Vitals/Pain Pain  Assessment:  (nauseated)     Hand Dominance Right   Extremity/Trunk Assessment Upper Extremity Assessment Upper Extremity Assessment: Overall WFL for tasks assessed           Communication Communication Communication: No difficulties   Cognition Arousal/Alertness: Awake/alert Behavior During Therapy: WFL for tasks assessed/performed Overall Cognitive Status: Within Functional Limits for tasks assessed                                                Home Living Family/patient expects to be discharged to:: Assisted living   Available Help at Discharge: Available 24 hours/day (but not right with her, only in building) Type of Home: Assisted living Home Access: Elevator     Home Layout: One level     Bathroom Shower/Tub: Occupational psychologist: Handicapped height     Home Equipment: Grab bars - toilet;Grab bars - tub/shower;Shower seat;Walker - 4 wheels;Cane - quad  Prior Functioning/Environment Level of Independence: Independent with assistive device(s)        Comments: Son puts her medications in pillbox for her, sometimes she goes down to cafeteria other times she makes things in her room, staff clean her apartment 1x/week.        OT Problem List: Impaired balance (sitting and/or standing)         OT Goals(Current goals can be found in the care plan section) Acute Rehab OT Goals Patient Stated Goal: to not be nauseated  OT Frequency:                AM-PAC OT "6 Clicks" Daily Activity     Outcome Measure Help from another person eating meals?: None Help from another person taking care of personal grooming?: A Little Help from another person toileting, which includes using toliet, bedpan, or urinal?: A Little Help from another person bathing (including washing, rinsing, drying)?: A Little Help from another person to put on and taking off regular upper body clothing?: A Little Help from another person to put on  and taking off regular lower body clothing?: A Little 6 Click Score: 19   End of Session Equipment Utilized During Treatment: Gait belt;Rolling walker  Activity Tolerance: Patient tolerated treatment well Patient left: in chair;with call bell/phone within reach;with chair alarm set  OT Visit Diagnosis: Unsteadiness on feet (R26.81);Muscle weakness (generalized) (M62.81)                Time: 5003-7048 OT Time Calculation (min): 16 min Charges:  OT General Charges $OT Visit: 1 Visit OT Evaluation $OT Eval Moderate Complexity: 1 Mod  [place], OTR/L Acute [employer] Pager [phone] Office [phone]     Almon Register 09/04/2020, 9:38 AM

## 2020-09-04 NOTE — Evaluation (Signed)
Physical Therapy Evaluation and Discharge Patient Details Name: Tara Fields MRN: [id number] DOB: [birth year] Today's Date: 09/04/2020   History of Present Illness  87 y.o. female with medical history significant of bilateral lung nodule/masses who reports having undergone radiation earlier this month, aortic stenosis, CAD, afib, COPD, DM, Diverticulitis, GERD, HTN, Hyperlipidemia, Legally blind in left eye presented 09/01/20 with abdominal pain and hypoxia due to CAP. +umbilical hernia  Clinical Impression   Patient evaluated by Physical Therapy. All education has been completed and the patient has no further questions. She agrees she is at her baseline for mobility, which includes generally weaker due to recent move and less active in her ALF apartment. She is interested in learning an exercise program to improve her balance.  See below for any follow-up Physical Therapy or equipment needs. PT is signing off. Thank you for this referral.     Follow Up Recommendations Home health PT    Equipment Recommendations  None recommended by PT    Recommendations for Other Services       Precautions / Restrictions Precautions Precautions: Fall Precaution Comments: denies falls or near falls Restrictions Weight Bearing Restrictions: No      Mobility  Bed Mobility Overal bed mobility: Independent                  Transfers Overall transfer level: Modified independent Equipment used: Rolling walker (2 wheeled);4-wheeled walker Transfers: Sit to/from Stand Sit to Stand: Modified independent (Device/Increase time)         General transfer comment: ambualting in room S with RW  Ambulation/Gait Ambulation/Gait assistance: Supervision Gait Distance (Feet): 30 Feet Assistive device: Rolling walker (2 wheeled) Gait Pattern/deviations: Step-through pattern;Shuffle;Trunk flexed Gait velocity: decr   General Gait Details: pt accustomed to rollator and required cues for  proximity to RW; cues at one point to roll RW and not lift it  Stairs            Wheelchair Mobility    Modified Rankin (Stroke Patients Only)       Balance Overall balance assessment: Mild deficits observed, not formally tested (able to reach into suitcase with bil UEs searching for items while in standing without LOB)                                           Pertinent Vitals/Pain Pain Assessment:  (nauseated)    Home Living Family/patient expects to be discharged to:: Assisted living   Available Help at Discharge: Available 24 hours/day (but not right with her, only in building) Type of Home: Assisted living Home Access: Elevator     Home Layout: One level Home Equipment: Grab bars - toilet;Grab bars - tub/shower;Shower seat;Walker - 4 wheels;Cane - quad      Prior Function Level of Independence: Independent with assistive device(s)         Comments: She uses a rollator due to bil knee pain and h/o back surgery. Son puts her medications in pillbox for her, sometimes she goes down to cafeteria other times she makes things in her room, staff clean her apartment 1x/week.     Hand Dominance   Dominant Hand: Right    Extremity/Trunk Assessment   Upper Extremity Assessment Upper Extremity Assessment: Overall WFL for tasks assessed    Lower Extremity Assessment Lower Extremity Assessment: Generalized weakness (somewhat limited by pain in knees)  Communication   Communication: No difficulties  Cognition Arousal/Alertness: Awake/alert Behavior During Therapy: WFL for tasks assessed/performed Overall Cognitive Status: Within Functional Limits for tasks assessed                                        General Comments General comments (skin integrity, edema, etc.): Pt to bathroom during session. Independent with transfer, clothing manipulation and pericare.    Exercises     Assessment/Plan    PT Assessment All  further PT needs can be met in the next venue of care  PT Problem List Decreased balance;Decreased mobility;Decreased knowledge of use of DME;Pain       PT Treatment Interventions      PT Goals (Current goals can be found in the Care Plan section)  Acute Rehab PT Goals Patient Stated Goal: to not be nauseated PT Goal Formulation: All assessment and education complete, DC therapy    Frequency     Barriers to discharge        Co-evaluation               AM-PAC PT "6 Clicks" Mobility  Outcome Measure Help needed turning from your back to your side while in a flat bed without using bedrails?: None Help needed moving from lying on your back to sitting on the side of a flat bed without using bedrails?: None Help needed moving to and from a bed to a chair (including a wheelchair)?: None Help needed standing up from a chair using your arms (e.g., wheelchair or bedside chair)?: None Help needed to walk in hospital room?: None Help needed climbing 3-5 steps with a railing? : A Little 6 Click Score: 23    End of Session   Activity Tolerance: Patient tolerated treatment well (on room air; sats 94% HR 110) Patient left: in chair;with call bell/phone within reach;with chair alarm set Nurse Communication: Other (comment) (ok for discharge) PT Visit Diagnosis: Other abnormalities of gait and mobility (R26.89)    Time: 2706-2376 PT Time Calculation (min) (ACUTE ONLY): 26 min   Charges:   PT Evaluation $PT Eval Low Complexity: 1 Low PT Treatments $Self Care/Home Management: 8-22         Arby Barrette, PT Pager [phone]   Rexanne Mano 09/04/2020, 10:24 AM

## 2020-09-04 NOTE — TOC Initial Note (Addendum)
Transition of Care [facility]) - Initial/Assessment Note    Patient Details  Name: Tara Fields MRN: [id number] Date of Birth: 08-Dec-1927  Transition of Care [facility]) CM/SW Contact:    Marilu Favre, RN Phone Number: 09/04/2020, 11:54 AM  Clinical Narrative:                 Spoke to patient at bedside. She is from [place]. NCM called and confirmed she is from [facility]. Discussed HHPT, they have [facility] in the building who provides HHPT. NCM instructed to call [facility] at [phone], and fax orders and face to face to [facility] at [phone]. NCM called and left voicemail. Secure chatted for orders . Received orders and faxed    [address] with [facility] called she received orders and will provide home health services.   Expected Discharge Plan: [place] Barriers to Discharge: No Barriers Identified   Patient Goals and CMS Choice Patient states their goals for this hospitalization and ongoing recovery are:: to return to home CMS Medicare.gov Compare Post Acute Care list provided to:: Patient Choice offered to / list presented to : Patient  Expected Discharge Plan and Services Expected Discharge Plan: [place]   Discharge Planning Services: CM Consult Post Acute Care Choice: [place] arrangements for the past 2 months: Apartment Expected Discharge Date: 09/04/20               DME Arranged: N/A DME Agency: NA       HH Arranged: PT [place] Agency: Other - See comment Secondary school teacher)        Prior Living Arrangements/Services Living arrangements for the past 2 months: Apartment Lives with:: Self Patient language and need for interpreter reviewed:: Yes              Criminal Activity/Legal Involvement Pertinent to Current Situation/Hospitalization: No - Comment as needed  Activities of Daily Living Home Assistive Devices/Equipment: Environmental consultant (specify type) ADL Screening (condition at time of  admission) Patient's cognitive ability adequate to safely complete daily activities?: Yes Is the patient deaf or have difficulty hearing?: Yes Does the patient have difficulty seeing, even when wearing glasses/contacts?: No Does the patient have difficulty concentrating, remembering, or making decisions?: No Patient able to express need for assistance with ADLs?: Yes Does the patient have difficulty dressing or bathing?: No Independently performs ADLs?: Yes (appropriate for developmental age) Does the patient have difficulty walking or climbing stairs?: No Weakness of Legs: None Weakness of Arms/Hands: None  Permission Sought/Granted   Permission granted to share information with : Yes, Verbal Permission Granted     Permission granted to share info w AGENCY: [employer]        Emotional Assessment Appearance:: Appears younger than stated age Attitude/Demeanor/Rapport: Engaged Affect (typically observed): Accepting Orientation: : Oriented to Self,Oriented to Place,Oriented to  Time,Oriented to Situation Alcohol / Substance Use: Not Applicable Psych Involvement: No (comment)  Admission diagnosis:  Hypoxemia [R09.02] Acute respiratory failure with hypoxia ([place]) [J96.01] Abdominal hernia without obstruction and without gangrene, recurrence not specified, unspecified hernia type [K46.9] Community acquired pneumonia, unspecified laterality [J18.9] Patient Active Problem List   Diagnosis Date Noted  . COPD (chronic obstructive pulmonary disease) ([place]) 09/02/2020  . Dyslipidemia 09/02/2020  . Acute respiratory failure with hypoxia ([place]) 09/01/2020  . Pulmonary nodule 1 cm or greater in diameter 01/03/2020  . S/P TAVR (transcatheter aortic valve replacement) 05/03/2019  . Primary osteoarthritis involving multiple joints 05/03/2019  .  Other persistent atrial fibrillation ([place]) 05/03/2019  . Bilateral carotid artery stenosis 12/01/2017  . Degeneration of meniscus of knee  08/13/2014  . Aortic valve disorder 05/03/2014  . Chronic obstructive bronchitis without exacerbation ([place]) 05/03/2014  . Derangement of left knee 05/03/2014  . Esophageal reflux 05/03/2014  . Essential hypertension 05/03/2014  . Pure hypercholesterolemia 05/03/2014  . Type II diabetes mellitus ([place]) 05/03/2014  . Vitamin D deficiency 05/03/2014   PCP:  [name], NP Pharmacy:   [id number] - [place], [place] [address] [place] [address] Phone: [phone] Fax: [phone]     Social Determinants of Health (SDOH) Interventions    Readmission Risk Interventions No flowsheet data found.

## 2020-09-06 LAB — CULTURE, BLOOD (ROUTINE X 2)
Culture: NO GROWTH
Culture: NO GROWTH

## 2020-09-10 ENCOUNTER — Other Ambulatory Visit: Payer: Self-pay

## 2020-09-11 ENCOUNTER — Encounter: Payer: Self-pay | Admitting: Adult Health

## 2020-09-11 ENCOUNTER — Ambulatory Visit (INDEPENDENT_AMBULATORY_CARE_PROVIDER_SITE_OTHER): Payer: Medicare Other | Admitting: Adult Health

## 2020-09-11 VITALS — BP 138/80 | Temp 97.9°F | Wt 150.0 lb

## 2020-09-11 DIAGNOSIS — R911 Solitary pulmonary nodule: Secondary | ICD-10-CM

## 2020-09-11 DIAGNOSIS — J189 Pneumonia, unspecified organism: Secondary | ICD-10-CM | POA: Diagnosis not present

## 2020-09-11 DIAGNOSIS — I4819 Other persistent atrial fibrillation: Secondary | ICD-10-CM | POA: Diagnosis not present

## 2020-09-11 LAB — CBC WITH DIFFERENTIAL/PLATELET
Basophils Absolute: 0 10*3/uL (ref 0.0–0.1)
Basophils Relative: 0.9 % (ref 0.0–3.0)
Eosinophils Absolute: 0.1 10*3/uL (ref 0.0–0.7)
Eosinophils Relative: 1.9 % (ref 0.0–5.0)
HCT: 36.1 % (ref 36.0–46.0)
Hemoglobin: 11.8 g/dL — ABNORMAL LOW (ref 12.0–15.0)
Lymphocytes Relative: 16.7 % (ref 12.0–46.0)
Lymphs Abs: 0.7 10*3/uL (ref 0.7–4.0)
MCHC: 32.7 g/dL (ref 30.0–36.0)
MCV: 88.3 fl (ref 78.0–100.0)
Monocytes Absolute: 0.3 10*3/uL (ref 0.1–1.0)
Monocytes Relative: 8.2 % (ref 3.0–12.0)
Neutro Abs: 3 10*3/uL (ref 1.4–7.7)
Neutrophils Relative %: 72.3 % (ref 43.0–77.0)
Platelets: 301 10*3/uL (ref 150.0–400.0)
RBC: 4.09 Mil/uL (ref 3.87–5.11)
RDW: 15.4 % (ref 11.5–15.5)
WBC: 4.2 10*3/uL (ref 4.0–10.5)

## 2020-09-11 LAB — BASIC METABOLIC PANEL
BUN: 17 mg/dL (ref 6–23)
CO2: 27 mEq/L (ref 19–32)
Calcium: 9.2 mg/dL (ref 8.4–10.5)
Chloride: 106 mEq/L (ref 96–112)
Creatinine, Ser: 0.78 mg/dL (ref 0.40–1.20)
GFR: 66.8 mL/min (ref >60.00)
Glucose, Bld: 100 mg/dL — ABNORMAL HIGH (ref 70–99)
Potassium: 4 mEq/L (ref 3.5–5.1)
Sodium: 141 mEq/L (ref 135–145)

## 2020-09-11 NOTE — Patient Instructions (Signed)
I am very glad you are feeling better!  Finish your antibiotics   We will do some blood work today   Please schedule a visit for a chest xray in 2 weeks   Let me know if you need anything

## 2020-09-11 NOTE — Progress Notes (Signed)
Subjective:    Patient ID: Tara Fields, female    DOB: 28-Jan-1928, 87 y.o.   MRN: [id number]  HPI 87 year old female who  has a past medical history of Aortic stenosis, CAD (coronary artery disease), COPD (chronic obstructive pulmonary disease) ([place]), Diabetes mellitus ([place]), Diverticulitis, GERD (gastroesophageal reflux disease), HTN (hypertension), Hyperlipidemia, Legally blind in left eye, as defined in [place], and Tobacco abuse.  She presents with her son today for TCM visit   Admit Date: 09/02/2019 Discharge Date 09/05/2019  She presented to the emergency room with 3 to 4 days of epigastric abdominal pain, pinching/squeezing in nature with associated shortness of breath.  She was noted to be hypoxic at intake, COVID-19 swab was negative however based on CTA of the chest a PE was ruled out but notable infection/inflammation of the left upper lobe.  Previous imaging noted a nodule too small for evaluation or intervention but being followed by pulmonary.  She was also noted to have a 2.1 cm nodule in the left lower lobe status post radiation earlier this month.  Given her abdominal complaints patient also had a CT of the abdomen which showed a small Richter umbilical hernia containing a small loop of transverse colon without any hemic bowel obstruction or information.  She was noted to have diverticulosis on imaging without diverticulitis.    She received a single dose of IV antibiotics in the emergency room, and she reported that her pain and shortness of breath resolved immediately.    Given the patient's hypoxia, concern for possible community-acquired pneumonia given inflammatory and atypical appearance on imaging she was admitted for further evaluation and treatment  While in the hospital she responded well to medical therapy and diuresis.  She was able to be weaned off oxygen to room air and maintained on room air before discharge.  Upon discharge she was able to ambulate in the halls on  room air while maintaining her O2 saturation.  Hospital Course  - Acute hypoxic respiratory failure- concern for CAP -Was initiated on doxycycline after azithromycin/ceftriaxone was discontinued due to questionable prolonged QT on EKG. - D/c home with Keflex 500 mg QID x 7 days and doxycycline 100 mg BID x 7 days   A fib, rate controlled on Eliquis  -She was continued on home Eliquis and rate control medications.  No issues during hospitalization  Apical lung nodule -Patient being followed by pulmonary, recent evaluation by Dr. Lamonte Sakai.  Today she reports that she is starting to feel back to her baseline especially when it comes to her breathing.  She has not developed any fevers or chills since being discharged from the hospital and has not had any issues with diarrhea.  Her biggest complaint today is that of mild intermittent nausea that can happen throughout the day.  She is ambulating at her assisted living facility without difficulty  Review of Systems  Constitutional: Negative.   HENT: Negative.   Respiratory: Negative.   Cardiovascular: Negative.   Gastrointestinal: Negative.   Genitourinary: Negative.   Neurological: Negative.   All other systems reviewed and are negative.  Past Medical History:  Diagnosis Date  . Aortic stenosis    s/p TAVR 26 mm Edwards Sapien 3 THV July 2020  . CAD (coronary artery disease)   . COPD (chronic obstructive pulmonary disease) ([place])   . Diabetes mellitus ([place])   . Diverticulitis   . GERD (gastroesophageal reflux disease)   . HTN (hypertension)   . Hyperlipidemia   . Legally  blind in left eye, as defined in [place]   . Tobacco abuse     Social History   Socioeconomic History  . Marital status: Widowed    Spouse name: Not on file  . Number of children: 2  . Years of education: Not on file  . Highest education level: Not on file  Occupational History  . Occupation: Retired  Tobacco Use  . Smoking status: Former Smoker    Packs/day:  1.00    Years: 70.00    Pack years: 70.00    Types: Cigarettes    Quit date: 12/15/2019    Years since quitting: 0.7  . Smokeless tobacco: Never Used  . Tobacco comment: Pt states quiting a year ago. 01/03/20 ARJ  Substance and Sexual Activity  . Alcohol use: No    Alcohol/week: 0.0 standard drinks  . Drug use: No  . Sexual activity: Not on file  Other Topics Concern  . Not on file  Social History Narrative   Widowed- was married for 28 years    Has two sons       Social Determinants of Radio broadcast assistant Strain: Not on file  Food Insecurity: Not on file  Transportation Needs: Not on file  Physical Activity: Not on file  Stress: Not on file  Social Connections: Not on file  Intimate Partner Violence: Not on file    Past Surgical History:  Procedure Laterality Date  . ABDOMINAL HYSTERECTOMY    . APPENDECTOMY    . BACK SURGERY    . CATARACT EXTRACTION, BILATERAL    . CHOLECYSTECTOMY      Family History  Problem Relation Age of Onset  . Diabetes Mother   . Stroke Brother   . Cerebral palsy Brother   . Coronary artery disease Neg Hx     Allergies  Allergen Reactions  . Amlodipine Besy-Benazepril Hcl Other (See Comments)    cough  . Hctz [Hydrochlorothiazide] Other (See Comments)  . Diclofenac Sodium Diarrhea  . Hydralazine     (Apresoline *ANTIHYPERTENSIVES*) HEPATITIS  . Methylprednisolone     (Medrol (Pak) *CORTICOSTEROIDS*) STATES ANGER, BUT ALOT OF ENERGY    Current Outpatient Medications on File Prior to Visit  Medication Sig Dispense Refill  . acetaminophen (TYLENOL) 325 MG tablet Take 650 mg by mouth every 6 (six) hours as needed (SEVERE PAIN).    [name] amLODipine (NORVASC) 10 MG tablet Take 10 mg by mouth daily.    [name] aspirin 81 MG chewable tablet Chew 1 tablet (81 mg total) by mouth daily. 90 tablet 3  . atorvastatin (LIPITOR) 40 MG tablet TAKE 1 TABLET (40 MG TOTAL) BY MOUTH DAILY AT 6 PM. 90 tablet 0  . cephALEXin (KEFLEX) 500 MG capsule Take  1 capsule (500 mg total) by mouth 4 (four) times daily for 7 days. 28 capsule 0  . Cholecalciferol (VITAMIN D-3 PO) Take 5,000 Units by mouth 2 (two) times daily.    . Coenzyme Q10-Fish Oil-Vit E (CO-Q 10 OMEGA-3 FISH OIL PO) Take 400 mg by mouth daily.    [name] ELIQUIS 5 MG TABS tablet TAKE 1 TABLET BY MOUTH TWICE A DAY (Patient taking differently: Take 5 mg by mouth 2 (two) times daily.) 180 tablet 0  . fenofibrate 160 MG tablet TAKE 1 TABLET BY MOUTH EVERY DAY (Patient taking differently: Take 160 mg by mouth every evening.) 90 tablet 1  . ferrous gluconate (FERGON) 324 MG tablet TAKE 1 TABLET BY MOUTH EVERY DAY WITH BREAKFAST (Patient taking  differently: Take 324 mg by mouth daily with breakfast.) 90 tablet 0  . losartan (COZAAR) 100 MG tablet TAKE 1 TABLET BY MOUTH EVERY DAY (Patient taking differently: Take 100 mg by mouth daily.) 90 tablet 0  . Multiple Vitamins-Minerals (WOMENS 50+ MULTI VITAMIN/MIN PO) Take 1 tablet by mouth daily. CENTRUM SILVER    . PROAIR HFA 108 (90 Base) MCG/ACT inhaler Use 2 inhalations every 4-6 hours as needed for wheezing or coughing. 1 Inhaler 3  . traMADol (ULTRAM) 50 MG tablet Take 50 mg by mouth 3 (three) times daily as needed for pain.    . metoprolol tartrate (LOPRESSOR) 50 MG tablet Take 1 tablet (50 mg total) by mouth 2 (two) times daily. 180 tablet 1   No current facility-administered medications on file prior to visit.    BP 138/80   Temp 97.9 F (36.6 C)   Wt 150 lb (68 kg)   BMI 29.29 kg/m       Objective:   Physical Exam Vitals and nursing note reviewed.  Constitutional:      Appearance: Normal appearance.  Cardiovascular:     Rate and Rhythm: Normal rate. Rhythm irregular.     Pulses: Normal pulses.     Heart sounds: Normal heart sounds.  Pulmonary:     Effort: Pulmonary effort is normal.     Breath sounds: Normal breath sounds.  Skin:    General: Skin is warm and dry.     Capillary Refill: Capillary refill takes less than 2 seconds.   Neurological:     General: No focal deficit present.     Mental Status: She is alert and oriented to person, place, and time.     Comments: In wheelchair   Psychiatric:        Mood and Affect: Mood normal.        Behavior: Behavior normal.        Thought Content: Thought content normal.        Judgment: Judgment normal.        Assessment & Plan:  1. Pneumonia of left upper lobe due to infectious organism -Reviewed hospital notes, discharge instructions, imaging, and labs that were done during hospital admission. All questions answered to the best of my ability. She appears to be improving, lung sounds clear on exam. I will check CBC and BMP today and repeat chest x-ray in 2 weeks - CBC with Differential/Platelet; Future - Basic Metabolic Panel; Future - CBC with Differential/Platelet - Basic Metabolic Panel - DG Chest 2 View; Future  2. Other persistent atrial fibrillation (H[facility]) - Continue Eliquis and rate control medications   3. Pulmonary nodule 1 cm or greater in diameter - Follow up with pulmonary as directed  [name], NP

## 2020-09-25 ENCOUNTER — Telehealth: Payer: Self-pay | Admitting: Adult Health

## 2020-09-25 ENCOUNTER — Other Ambulatory Visit: Payer: Self-pay | Admitting: Adult Health

## 2020-09-25 ENCOUNTER — Other Ambulatory Visit: Payer: Self-pay

## 2020-09-25 ENCOUNTER — Ambulatory Visit (INDEPENDENT_AMBULATORY_CARE_PROVIDER_SITE_OTHER): Payer: Medicare Other | Admitting: Adult Health

## 2020-09-25 ENCOUNTER — Encounter: Payer: Self-pay | Admitting: Adult Health

## 2020-09-25 ENCOUNTER — Other Ambulatory Visit: Payer: Medicare Other

## 2020-09-25 ENCOUNTER — Ambulatory Visit (INDEPENDENT_AMBULATORY_CARE_PROVIDER_SITE_OTHER): Payer: Medicare Other

## 2020-09-25 VITALS — BP 150/74 | Temp 97.4°F | Wt 152.0 lb

## 2020-09-25 DIAGNOSIS — J189 Pneumonia, unspecified organism: Secondary | ICD-10-CM | POA: Diagnosis not present

## 2020-09-25 DIAGNOSIS — R600 Localized edema: Secondary | ICD-10-CM | POA: Diagnosis not present

## 2020-09-25 MED ORDER — FUROSEMIDE 20 MG PO TABS: 20.0000 mg | ORAL_TABLET | Freq: Every day | ORAL | 0 refills | Status: DC

## 2020-09-25 MED ORDER — POTASSIUM CHLORIDE ER 10 MEQ PO TBCR
10.0000 meq | EXTENDED_RELEASE_TABLET | Freq: Every day | ORAL | 0 refills | Status: DC
Start: 2020-09-25 — End: 2020-12-19

## 2020-09-25 MED ORDER — DOXYCYCLINE HYCLATE 100 MG PO CAPS: 100.0000 mg | ORAL_CAPSULE | Freq: Two times a day (BID) | ORAL | 0 refills | Status: DC

## 2020-09-25 NOTE — Progress Notes (Signed)
Subjective:    Patient ID: Tara Fields, female    DOB: 03/21/1928, 87 y.o.   MRN: [id number]  HPI 87 year old female who  has a past medical history of Aortic stenosis, CAD (coronary artery disease), COPD (chronic obstructive pulmonary disease) ([place]), Diabetes mellitus ([place]), Diverticulitis, GERD (gastroesophageal reflux disease), HTN (hypertension), Hyperlipidemia, Legally blind in left eye, as defined in [place], and Tobacco abuse.  She presents to the office today for an acute on chronic issue of lower extremity edema bilaterally.  He was last evaluated for this in April 2021.  Most recently edema has been present for the last week or so.  She is pretty sedentary and in eats a diet high in sodium.  Has been elevating her legs with minimal decrease in edema.  She has not noticed any redness or warmth in her calf.  Has pain with palpation throughout bilateral legs.  No worsening shortness of breath past baseline.  No chest pain   Review of Systems See HPI   Past Medical History:  Diagnosis Date  . Aortic stenosis    s/p TAVR 26 mm Edwards Sapien 3 THV July 2020  . CAD (coronary artery disease)   . COPD (chronic obstructive pulmonary disease) ([place])   . Diabetes mellitus ([place])   . Diverticulitis   . GERD (gastroesophageal reflux disease)   . HTN (hypertension)   . Hyperlipidemia   . Legally blind in left eye, as defined in [place]   . Tobacco abuse     Social History   Socioeconomic History  . Marital status: Widowed    Spouse name: Not on file  . Number of children: 2  . Years of education: Not on file  . Highest education level: Not on file  Occupational History  . Occupation: Retired  Tobacco Use  . Smoking status: Former Smoker    Packs/day: 1.00    Years: 70.00    Pack years: 70.00    Types: Cigarettes    Quit date: 12/15/2019    Years since quitting: 0.7  . Smokeless tobacco: Never Used  . Tobacco comment: Pt states quiting a year ago. 01/03/20 ARJ  Substance and  Sexual Activity  . Alcohol use: No    Alcohol/week: 0.0 standard drinks  . Drug use: No  . Sexual activity: Not on file  Other Topics Concern  . Not on file  Social History Narrative   Widowed- was married for 62 years    Has two sons       Social Determinants of Radio broadcast assistant Strain: Not on file  Food Insecurity: Not on file  Transportation Needs: Not on file  Physical Activity: Not on file  Stress: Not on file  Social Connections: Not on file  Intimate Partner Violence: Not on file    Past Surgical History:  Procedure Laterality Date  . ABDOMINAL HYSTERECTOMY    . APPENDECTOMY    . BACK SURGERY    . CATARACT EXTRACTION, BILATERAL    . CHOLECYSTECTOMY      Family History  Problem Relation Age of Onset  . Diabetes Mother   . Stroke Brother   . Cerebral palsy Brother   . Coronary artery disease Neg Hx     Allergies  Allergen Reactions  . Amlodipine Besy-Benazepril Hcl Other (See Comments)    cough  . Hctz [Hydrochlorothiazide] Other (See Comments)  . Diclofenac Sodium Diarrhea  . Hydralazine     (Apresoline *ANTIHYPERTENSIVES*) HEPATITIS  . Methylprednisolone     (  Medrol (Pak) *CORTICOSTEROIDS*) STATES ANGER, BUT ALOT OF ENERGY    Current Outpatient Medications on File Prior to Visit  Medication Sig Dispense Refill  . acetaminophen (TYLENOL) 325 MG tablet Take 650 mg by mouth every 6 (six) hours as needed (SEVERE PAIN).    [name] amLODipine (NORVASC) 10 MG tablet Take 10 mg by mouth daily.    [name] aspirin 81 MG chewable tablet Chew 1 tablet (81 mg total) by mouth daily. 90 tablet 3  . atorvastatin (LIPITOR) 40 MG tablet TAKE 1 TABLET (40 MG TOTAL) BY MOUTH DAILY AT 6 PM. 90 tablet 0  . Cholecalciferol (VITAMIN D-3 PO) Take 5,000 Units by mouth 2 (two) times daily.    [name] ELIQUIS 5 MG TABS tablet TAKE 1 TABLET BY MOUTH TWICE A DAY (Patient taking differently: Take 5 mg by mouth 2 (two) times daily.) 180 tablet 0  . fenofibrate 160 MG tablet TAKE 1 TABLET  BY MOUTH EVERY DAY (Patient taking differently: Take 160 mg by mouth every evening.) 90 tablet 1  . ferrous gluconate (FERGON) 324 MG tablet TAKE 1 TABLET BY MOUTH EVERY DAY WITH BREAKFAST (Patient taking differently: Take 324 mg by mouth daily with breakfast.) 90 tablet 0  . losartan (COZAAR) 100 MG tablet TAKE 1 TABLET BY MOUTH EVERY DAY (Patient taking differently: Take 100 mg by mouth daily.) 90 tablet 0  . Multiple Vitamins-Minerals (WOMENS 50+ MULTI VITAMIN/MIN PO) Take 1 tablet by mouth daily. CENTRUM SILVER    . NASAL SPRAY SALINE NA Place into the nose.    . traMADol (ULTRAM) 50 MG tablet Take 50 mg by mouth 3 (three) times daily as needed for pain.    . metoprolol tartrate (LOPRESSOR) 50 MG tablet Take 1 tablet (50 mg total) by mouth 2 (two) times daily. 180 tablet 1   No current facility-administered medications on file prior to visit.    BP (!) 150/74   Temp (!) 97.4 F (36.3 C) (Oral)   Wt 152 lb (68.9 kg)   BMI 29.69 kg/m       Objective:   Physical Exam Constitutional:      Appearance: Normal appearance.  Cardiovascular:     Rate and Rhythm: Normal rate and regular rhythm.     Pulses: Normal pulses.     Heart sounds: Normal heart sounds.     Comments: Pitting edema noted from ankle to top of the shin bilaterally.  No calf pain, redness, or warmth. Pulmonary:     Effort: Pulmonary effort is normal.     Breath sounds: Normal breath sounds.  Musculoskeletal:     Right lower leg: 2+ Pitting Edema present.     Left lower leg: 2+ Pitting Edema present.  Skin:    General: Skin is warm and dry.     Capillary Refill: Capillary refill takes less than 2 seconds.  Neurological:     Mental Status: She is alert.  Psychiatric:        Mood and Affect: Mood normal.        Behavior: Behavior normal.        Thought Content: Thought content normal.        Judgment: Judgment normal.       Assessment & Plan:  1. Bilateral lower extremity edema -We will treat with Lasix 20  mg daily, advised that take until swelling has resolved and she can quit the Lasix.  We will supplement her potassium as well.  Encouraged to continue elevation, low-sodium diet.  Compression socks  would be beneficial but she has trouble putting them on by herself and has nobody that can help her. - furosemide (LASIX) 20 MG tablet; Take 1 tablet (20 mg total) by mouth daily.  Dispense: 10 tablet; Refill: 0 - potassium chloride (KLOR-CON) 10 MEQ tablet; Take 1 tablet (10 mEq total) by mouth daily.  Dispense: 10 tablet; Refill: 0 - Follow up as needed  [name], NP

## 2020-09-25 NOTE — Telephone Encounter (Signed)
Updated the patient son on xray which showed interval clearing of pneumonia. Will send in doxycycline and have them repeat xray in 4 weeks

## 2020-10-03 ENCOUNTER — Other Ambulatory Visit: Payer: Self-pay | Admitting: Adult Health

## 2020-10-03 DIAGNOSIS — M8949 Other hypertrophic osteoarthropathy, multiple sites: Secondary | ICD-10-CM

## 2020-10-03 NOTE — Telephone Encounter (Signed)
Requesting: tramadol  Contract: n/a UDS:n/a Last Visit:09/25/2020 Next Visit:n/a Last Refill:08/20/2020  Please Advise

## 2020-10-03 NOTE — Telephone Encounter (Signed)
  traMADol (ULTRAM) 50 MG tablet  [id number] - [place], [place] - [address] AT [address] Phone:  [phone]  Fax:  [phone]

## 2020-10-09 ENCOUNTER — Other Ambulatory Visit: Payer: Self-pay | Admitting: Adult Health

## 2020-10-09 ENCOUNTER — Other Ambulatory Visit: Payer: Self-pay | Admitting: Cardiovascular Disease

## 2020-10-21 ENCOUNTER — Other Ambulatory Visit: Payer: Self-pay | Admitting: Cardiovascular Disease

## 2020-10-21 ENCOUNTER — Other Ambulatory Visit: Payer: Self-pay | Admitting: Adult Health

## 2020-10-21 DIAGNOSIS — I10 Essential (primary) hypertension: Secondary | ICD-10-CM

## 2020-10-21 NOTE — Telephone Encounter (Signed)
Pt had a foot swelling appt on 09/25/20, and lab appt scheduled for tomorrow, will route to PCP because not sure if PCP is filling meds or cardiology

## 2020-10-22 ENCOUNTER — Ambulatory Visit (INDEPENDENT_AMBULATORY_CARE_PROVIDER_SITE_OTHER): Payer: Medicare Other

## 2020-10-22 ENCOUNTER — Other Ambulatory Visit: Payer: Self-pay

## 2020-10-22 ENCOUNTER — Other Ambulatory Visit: Payer: Medicare Other

## 2020-10-22 DIAGNOSIS — J189 Pneumonia, unspecified organism: Secondary | ICD-10-CM | POA: Diagnosis not present

## 2020-11-03 ENCOUNTER — Ambulatory Visit (INDEPENDENT_AMBULATORY_CARE_PROVIDER_SITE_OTHER): Payer: Medicare Other

## 2020-11-03 ENCOUNTER — Other Ambulatory Visit: Payer: Self-pay

## 2020-11-03 ENCOUNTER — Ambulatory Visit (INDEPENDENT_AMBULATORY_CARE_PROVIDER_SITE_OTHER): Payer: Medicare Other | Admitting: Emergency Medicine

## 2020-11-03 ENCOUNTER — Encounter: Payer: Self-pay | Admitting: Emergency Medicine

## 2020-11-03 VITALS — BP 112/74 | HR 86 | Temp 97.6°F | Ht 60.0 in | Wt 149.8 lb

## 2020-11-03 DIAGNOSIS — Z8701 Personal history of pneumonia (recurrent): Secondary | ICD-10-CM

## 2020-11-03 DIAGNOSIS — I6523 Occlusion and stenosis of bilateral carotid arteries: Secondary | ICD-10-CM

## 2020-11-03 DIAGNOSIS — R911 Solitary pulmonary nodule: Secondary | ICD-10-CM | POA: Diagnosis not present

## 2020-11-03 NOTE — Patient Instructions (Signed)
Chest x-ray today We will repeat your CT scan of the chest in 02/2021 to follow your treated pulmonary nodule and associated lung scarring. Follow Dr. Lamonte Sakai in July after your CT so that we can review the results together.

## 2020-11-03 NOTE — Assessment & Plan Note (Signed)
Left lower lobe pulmonary nodule was treated with XRT.  She had an area of adjacent evolving interstitial change in the left upper lobe on her CT chest 1/6.  She then was in the hospital with what sounds like some volume overload versus pneumonia versus both.  She was treated for both.  She had more prominent left upper lobe infiltrate at that time but this was in the setting of volume overload, effusions, poor inspiration.  She is now clinically improved after treatment.  We will plan to repeat her CT chest in 02/2021 to ensure stability of both the nodule and the left upper lobe area of scarring.

## 2020-11-03 NOTE — Progress Notes (Signed)
Subjective:    Patient ID: Tara Fields, female    DOB: [birth year], 87 y.o.   MRN: [id number]  HPI 87 year old woman with a history of former tobacco use (70 pack years), associated COPD (no PFTs, no BD), A Fib, CAD, aortic stenosis for which she underwent TAVR July 2020 and is on Eliquis, hypertension, diabetes.   She is referred today for abnormal CT scan of the chest.  She originally had a chest x-ray 11/24/2019 that showed a hazy left upper lobe density, still present on repeat chest x-ray 4/27 so a CT chest was done on 12/28/2019.  I have reviewed all the films.  The CT shows an isolated spiculated superior segmental left lower lobe nodule 2.4 x 1.8 cm. Also noted was a 4 mm right lower lobe nodule, emphysematous change and a left adrenal mass 2.5 x 1.8 cm.  Her son is here and assists with the history She denies any cough, dyspnea, does have severe arthritis.   ROV 08/04/20 --follow-up visit for 87 year old former smoker (40 pack years) with associated COPD (no PFTs), atrial fibrillation, CAD, aortic stenosis with TAVR on Eliquis, hypertension, diabetes.  I met her in May following a CT chest that showed a 2.4 cm spiculated left lower lobe nodule, left adrenal mass.  A PET scan done 01/16/2020 reviewed by me, showed that the adrenal adenoma was benign but that the left lower lobe lesion was hypermetabolic.  There was no other distant disease identified. I referred her for SBRT with Dr Sondra Come. She feels well, denies any breathing difficulty. She had some L CP in November that prompted an ED visit, CXR as below.  She tolerated XRT without difficulty.  She has not followed back up with Dr. Sondra Come yet  CXR 07/15/20 reviewed by me, showed a mild interstitial pattern, stable from prior, low lung volumes, no evidence of the left lower lobe mass.  ROV 11/03/20 --Tara Fields is 70 and a former smoker.  She has COPD, A. fib, CAD, AS/TAVR.  She is also undergone SBRT for a spiculated left lower  lobe nodule.  I repeated her CT scan of the chest to look for interval stability in this area and also a 5 mm right lower lobe nodule.  Unfortunately about 11 days after that scan she was admitted to the hospital with symptoms consistent with a pneumonia.  Confirmed on CT chest 1/17. She had some LE edema as well.   CT chest 08/21/2020 reviewed by me, showed a decrease in size of her left lower lobe pulmonary nodule consistent with treatment with some evolving postradiation scar in the adjacent LUL, no change in the right lower lobe 5 mm nodule. CT angio abdomen and pelvis, chest 09/01/2020 reviewed, shows some evolution of the left upper lobe airspace disease, new mediastinal adenopathy, B effusions and fluid in the fissure.   Review of Systems As per HPI      Objective:   Physical Exam  Vitals:   11/03/20 1541  BP: 112/74  Pulse: 86  Temp: 97.6 F (36.4 C)  TempSrc: Temporal  SpO2: 97%  Weight: 149 lb 12.8 oz (67.9 kg)  Height: 5' (1.524 m)   Gen: Pleasant, elderly woman,  well-nourished, in no distress,  In wheelchair  ENT: No lesions,  mouth clear,  oropharynx clear, no postnasal drip  Neck: No JVD, no stridor  Lungs: distant, no wheeze or crackles.   Cardiovascular: irreg irreg, 2/6 S murmur  Musculoskeletal: No deformities, no cyanosis or clubbing  Neuro: alert, awake, oriented  Skin: Warm, no lesions or rash      Assessment & Plan:  Pulmonary nodule 1 cm or greater in diameter Left lower lobe pulmonary nodule was treated with XRT.  She had an area of adjacent evolving interstitial change in the left upper lobe on her CT chest 1/6.  She then was in the hospital with what sounds like some volume overload versus pneumonia versus both.  She was treated for both.  She had more prominent left upper lobe infiltrate at that time but this was in the setting of volume overload, effusions, poor inspiration.  She is now clinically improved after treatment.  We will plan to repeat  her CT chest in 02/2021 to ensure stability of both the nodule and the left upper lobe area of scarring.  Baltazar Apo, MD, PhD 11/03/2020, 4:30 PM [facility] [phone] or if no answer [phone]

## 2020-11-08 ENCOUNTER — Encounter (HOSPITAL_BASED_OUTPATIENT_CLINIC_OR_DEPARTMENT_OTHER): Payer: Self-pay | Admitting: Emergency Medicine

## 2020-11-08 ENCOUNTER — Other Ambulatory Visit: Payer: Self-pay

## 2020-11-08 ENCOUNTER — Emergency Department (HOSPITAL_BASED_OUTPATIENT_CLINIC_OR_DEPARTMENT_OTHER): Payer: Medicare Other

## 2020-11-08 ENCOUNTER — Emergency Department (HOSPITAL_BASED_OUTPATIENT_CLINIC_OR_DEPARTMENT_OTHER)
Admission: EM | Admit: 2020-11-08 | Discharge: 2020-11-08 | Disposition: A | Discharge status: Home / Self Care | Payer: Medicare Other | Attending: Emergency Medicine | Admitting: Emergency Medicine

## 2020-11-08 ENCOUNTER — Other Ambulatory Visit: Payer: Self-pay | Admitting: Adult Health

## 2020-11-08 ENCOUNTER — Other Ambulatory Visit: Payer: Self-pay | Admitting: Cardiovascular Disease

## 2020-11-08 DIAGNOSIS — W19XXXA Unspecified fall, initial encounter: Secondary | ICD-10-CM

## 2020-11-08 DIAGNOSIS — Z79899 Other long term (current) drug therapy: Secondary | ICD-10-CM | POA: Diagnosis not present

## 2020-11-08 DIAGNOSIS — E119 Type 2 diabetes mellitus without complications: Secondary | ICD-10-CM | POA: Diagnosis not present

## 2020-11-08 DIAGNOSIS — Z7901 Long term (current) use of anticoagulants: Secondary | ICD-10-CM | POA: Diagnosis not present

## 2020-11-08 DIAGNOSIS — I10 Essential (primary) hypertension: Secondary | ICD-10-CM | POA: Diagnosis not present

## 2020-11-08 DIAGNOSIS — S299XXA Unspecified injury of thorax, initial encounter: Secondary | ICD-10-CM | POA: Insufficient documentation

## 2020-11-08 DIAGNOSIS — Z87891 Personal history of nicotine dependence: Secondary | ICD-10-CM | POA: Diagnosis not present

## 2020-11-08 DIAGNOSIS — I2510 Atherosclerotic heart disease of native coronary artery without angina pectoris: Secondary | ICD-10-CM | POA: Insufficient documentation

## 2020-11-08 DIAGNOSIS — W06XXXA Fall from bed, initial encounter: Secondary | ICD-10-CM | POA: Insufficient documentation

## 2020-11-08 DIAGNOSIS — S298XXA Other specified injuries of thorax, initial encounter: Secondary | ICD-10-CM

## 2020-11-08 DIAGNOSIS — Z7982 Long term (current) use of aspirin: Secondary | ICD-10-CM | POA: Diagnosis not present

## 2020-11-08 DIAGNOSIS — S0990XA Unspecified injury of head, initial encounter: Secondary | ICD-10-CM | POA: Insufficient documentation

## 2020-11-08 MED ORDER — TRAMADOL HCL 50 MG PO TABS
50.0000 mg | ORAL_TABLET | Freq: Once | ORAL | Status: AC
  Administered 2020-11-08: 50 mg via ORAL
  Filled 2020-11-08: qty 1

## 2020-11-08 MED ORDER — ACETAMINOPHEN 325 MG PO TABS
650.0000 mg | ORAL_TABLET | Freq: Once | ORAL | Status: AC
  Administered 2020-11-08: 650 mg via ORAL
  Filled 2020-11-08: qty 2

## 2020-11-08 NOTE — ED Provider Notes (Signed)
[facility] EMERGENCY DEPT Provider Note   CSN: [id number] Arrival date & time: 11/08/20  0010     History Chief Complaint  Patient presents with  . Fall    Tara Fields is a 87 y.o. female.  The history is provided by the patient and a relative.  Fall This is a new problem. The current episode started 1 to 2 hours ago. The problem occurs constantly. The problem has been gradually worsening. Associated symptoms include headaches. Pertinent negatives include no abdominal pain and no shortness of breath. Exacerbated by: movement/palpation. The symptoms are relieved by rest. She has tried nothing for the symptoms.  Patient history of aortic stenosis, CAD, atrial fibrillation, COPD presents after a fall.  This occurred approximately 10 PM on March 25.  Patient does not member falling, but she was asleep in her bed and rolled out of the bed.  She reports at first she did not have much pain, but since that time she is having increasing pain in her right chest.  She is unsure if she hit her head, but she now has a mild headache.  No neck or back pain.  No abdominal pain.  No vomiting. Prior to this fall, she was at her baseline. She has been ambulatory since the fall     Past Medical History:  Diagnosis Date  . Aortic stenosis    s/p TAVR 26 mm Edwards Sapien 3 THV July 2020  . CAD (coronary artery disease)   . COPD (chronic obstructive pulmonary disease) ([place])   . Diabetes mellitus ([place])   . Diverticulitis   . GERD (gastroesophageal reflux disease)   . HTN (hypertension)   . Hyperlipidemia   . Legally blind in left eye, as defined in [place]   . Tobacco abuse     Patient Active Problem List   Diagnosis Date Noted  . COPD (chronic obstructive pulmonary disease) ([place]) 09/02/2020  . Dyslipidemia 09/02/2020  . Acute respiratory failure with hypoxia ([place]) 09/01/2020  . Pulmonary nodule 1 cm or greater in diameter 01/03/2020  . S/P TAVR (transcatheter aortic valve  replacement) 05/03/2019  . Primary osteoarthritis involving multiple joints 05/03/2019  . Other persistent atrial fibrillation ([place]) 05/03/2019  . Bilateral carotid artery stenosis 12/01/2017  . 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/13/2014  . Aortic valve disorder 05/03/2014  . Chronic obstructive bronchitis without exacerbation ([place]) 05/03/2014  . Derangement of left knee 05/03/2014  . Esophageal reflux 05/03/2014  . Essential hypertension 05/03/2014  . Pure hypercholesterolemia 05/03/2014  . Type II diabetes mellitus ([place]) 05/03/2014  . Vitamin D deficiency 05/03/2014    Past Surgical History:  Procedure Laterality Date  . ABDOMINAL HYSTERECTOMY    . APPENDECTOMY    . BACK SURGERY    . CATARACT EXTRACTION, BILATERAL    . CHOLECYSTECTOMY       OB History   No obstetric history on file.     Family History  Problem Relation Age of Onset  . Diabetes Mother   . Stroke Brother   . Cerebral palsy Brother   . Coronary artery disease Neg Hx     Social History   Tobacco Use  . Smoking status: Former Smoker    Packs/day: 1.00    Years: 70.00    Pack years: 70.00    Types: Cigarettes    Quit date: 12/15/2019    Years since quitting: 0.9  . Smokeless tobacco: Never Used  . Tobacco comment: Pt states quiting a year ago. 01/03/20 ARJ  Substance  Use Topics  . Alcohol use: No    Alcohol/week: 0.0 standard drinks  . Drug use: No    Home Medications Prior to Admission medications   Medication Sig Start Date End Date Taking? Authorizing Provider  acetaminophen (TYLENOL) 325 MG tablet Take 650 mg by mouth every 6 (six) hours as needed (SEVERE PAIN).    [provider]  amLODipine (NORVASC) 10 MG tablet Take 10 mg by mouth daily.    [provider]  amLODipine (NORVASC) 5 MG tablet TAKE 1 TABLET BY MOUTH EVERY DAY 10/22/20   Nafziger, Tommi Rumps, NP  aspirin 81 MG chewable tablet Chew 1 tablet (81 mg total) by mouth daily. 04/01/20   Nafziger, Tommi Rumps, NP  atorvastatin  (LIPITOR) 40 MG tablet TAKE 1 TABLET BY MOUTH DAILY AT 6 PM. 10/09/20   Nafziger, Tommi Rumps, NP  Cholecalciferol (VITAMIN D-3 PO) Take 5,000 Units by mouth 2 (two) times daily.    [provider]  doxycycline (VIBRAMYCIN) 100 MG capsule Take 1 capsule (100 mg total) by mouth 2 (two) times daily. 09/25/20   Nafziger, Tommi Rumps, NP  ELIQUIS 5 MG TABS tablet TAKE 1 TABLET BY MOUTH TWICE A DAY 10/22/20   Nafziger, Tommi Rumps, NP  fenofibrate 160 MG tablet TAKE 1 TABLET BY MOUTH EVERY DAY 10/22/20   Nafziger, Tommi Rumps, NP  ferrous gluconate (FERGON) 324 MG tablet Take 1 tablet (324 mg total) by mouth daily with breakfast. 10/09/20   Nafziger, Tommi Rumps, NP  furosemide (LASIX) 20 MG tablet Take 1 tablet (20 mg total) by mouth daily. 09/25/20   Nafziger, Tommi Rumps, NP  losartan (COZAAR) 100 MG tablet TAKE 1 TABLET BY MOUTH EVERY DAY 10/09/20   Burnell Blanks, MD  metoprolol tartrate (LOPRESSOR) 50 MG tablet TAKE 1 TABLET BY MOUTH TWICE A DAY 10/22/20   Nafziger, Tommi Rumps, NP  Multiple Vitamins-Minerals (WOMENS 50+ [place] VITAMIN/MIN PO) Take 1 tablet by mouth daily. CENTRUM SILVER    [provider]  NASAL SPRAY SALINE NA Place into the nose.    [provider]  potassium chloride (KLOR-CON) 10 MEQ tablet Take 1 tablet (10 mEq total) by mouth daily. 09/25/20   Nafziger, Tommi Rumps, NP  traMADol (ULTRAM) 50 MG tablet TAKE 1 TABLET (50 MG TOTAL) BY MOUTH IN THE MORNING, AT NOON, AND AT BEDTIME. 10/03/20   Nafziger, Tommi Rumps, NP    Allergies    Amlodipine besy-benazepril hcl, Hctz [hydrochlorothiazide], Diclofenac sodium, Hydralazine, and Methylprednisolone  Review of Systems   Review of Systems  Constitutional: Negative for fever.  Respiratory: Negative for shortness of breath.   Gastrointestinal: Negative for abdominal pain and vomiting.  Musculoskeletal: Negative for back pain and neck pain.       Chest wall pain   Neurological: Positive for headaches.  All other systems reviewed and are negative.   Physical  Exam Updated Vital Signs BP (!) 155/91 (BP Location: Right Arm)   Pulse 86   Temp 98.1 F (36.7 C) (Oral)   Resp 20   Ht 1.524 m (5')   Wt 67.6 kg   SpO2 96%   BMI 29.10 kg/m   Physical Exam CONSTITUTIONAL: Elderly, uncomfortable appearing HEAD: Normocephalic/atraumatic EYES: EOMI/PERRL ENMT: Mucous membranes moist NECK: supple no meningeal signs SPINE/BACK:entire spine nontender, no bruising/crepitance/stepoffs noted to spine, nexus criteria met CV: S1/S2 noted LUNGS: Lungs are clear to auscultation bilaterally, no apparent distress Chest-diffuse right-sided chest wall tenderness.  No crepitus or bruising.  Family present during exam at patient request ABDOMEN: soft, nontender, no rebound or guarding, bowel  sounds noted throughout abdomen, no bruising noted GU:no cva tenderness, no flank hematoma NEURO: Pt is awake/alert/appropriate, moves all extremitiesx4.  No facial droop.   EXTREMITIES: pulses normal/equal, full ROM, no deformities, all other extremities/joints palpated/ranged and nontender SKIN: warm, color normal PSYCH: Mildly anxious  ED Results / Procedures / Treatments   Labs (all labs ordered are listed, but only abnormal results are displayed) Labs Reviewed - No data to display  EKG None  Radiology DG Ribs Unilateral W/Chest Right  Result Date: 11/08/2020 CLINICAL DATA:  Recent fall from bed with right-sided chest pain, initial encounter EXAM: RIGHT RIBS AND CHEST - 3+ VIEW COMPARISON:  11/03/2020 FINDINGS: Cardiac shadow is mildly enlarged. Changes of prior TAVR are noted. Aortic calcifications are again seen. Patchy left upper lobe infiltrate is again noted and stable. Lungs are otherwise clear. No pneumothorax is noted. No acute rib fracture is seen. Degenerative changes of the right shoulder joint are noted. IMPRESSION: No evidence of acute rib fracture. Stable left upper lobe infiltrate. Electronically Signed   By: Inez Catalina M.D.   On: 11/08/2020 01:39    CT Head Wo Contrast  Result Date: 11/08/2020 CLINICAL DATA:  Fall from bed with headaches, initial encounter EXAM: CT HEAD WITHOUT CONTRAST TECHNIQUE: Contiguous axial images were obtained from the base of the skull through the vertex without intravenous contrast. COMPARISON:  None. FINDINGS: Brain: No evidence of acute infarction, hemorrhage, hydrocephalus, extra-axial collection or mass lesion/mass effect. Scattered basal ganglia calcifications are noted. Vascular: No hyperdense vessel or unexpected calcification. Skull: Normal. Negative for fracture or focal lesion. Sinuses/Orbits: No acute finding. Other: None. IMPRESSION: No acute intracranial abnormality noted. Electronically Signed   By: Inez Catalina M.D.   On: 11/08/2020 01:36    Procedures Procedures   Medications Ordered in ED Medications  traMADol (ULTRAM) tablet 50 mg (50 mg Oral Given 11/08/20 0036)  acetaminophen (TYLENOL) tablet 650 mg (650 mg Oral Given 11/08/20 0035)    ED Course  I have reviewed the triage vital signs and the nursing notes.  Pertinent labs & imaging results that were available during my care of the patient were reviewed by me and considered in my medical decision making (see chart for details).    MDM Rules/Calculators/A&P                          12:36 AM Patient presents after rolling out of bed.  She denies significant pain in her chest wall.  Overall vitals are appropriate.  She now reports headache and unsure if she hit her head.  We will proceed with CT head and chest x-ray 1:57 AM No acute findings on CT head or chest x-ray.  Patient feels improved No hypoxia.  She is able to ambulate without dizziness or significant pain. Although x-ray was negative, there is still a chance she has an occult rib fracture.  Will provide incentive spirometer.  She has pain medicines at home. On repeat exam she has no focal abdominal or flank tenderness No other signs of trauma We discussed strict return  precautions.  Pulmonary nodule noted that is already being managed by pulmonology.  Patient and family are aware of this nodule Final Clinical Impression(s) / ED Diagnoses Final diagnoses:  Fall, initial encounter  Blunt trauma to chest, initial encounter  Injury of head, initial encounter    Rx / DC Orders ED Discharge Orders    None       Ripley Fraise, MD  11/08/20 0159

## 2020-11-08 NOTE — ED Triage Notes (Signed)
  Patient comes in after a fall that occurred around 2200.  Patient states she was asleep in bed and rolled out of the bed.  States she doesn't remember falling and doesn't know if she hit her head.  No obvious bruising or hematomas.  Patient on eliquis and 81 mg baby ASA.  Patient A&O x4.  Patient is a resident at [facility].  Has arm/leg pain but states they always hurt.  Does endorse pain on R side of rib cage. 9/10 sharp and tender.

## 2020-11-08 NOTE — ED Notes (Signed)
Patient returned from Radiology at this time.

## 2020-11-08 NOTE — ED Notes (Signed)
No Obvious Head Trauma present but MD Wickline made aware that patient does take Eliquis.

## 2020-11-08 NOTE — ED Notes (Signed)
  Patient able to stand up on the side of the bed with minimal assistance.  No dizziness or altered gait apart from her baseline.  Patient ambulated to sink and back to bed with standby assist.  Patient normally uses walker at home.

## 2020-11-10 ENCOUNTER — Encounter: Payer: Self-pay | Admitting: Adult Health

## 2020-11-10 ENCOUNTER — Emergency Department (HOSPITAL_BASED_OUTPATIENT_CLINIC_OR_DEPARTMENT_OTHER)
Admission: EM | Admit: 2020-11-10 | Discharge: 2020-11-10 | Disposition: A | Discharge status: Home / Self Care | Payer: Medicare Other | Attending: Emergency Medicine | Admitting: Emergency Medicine

## 2020-11-10 ENCOUNTER — Encounter (HOSPITAL_BASED_OUTPATIENT_CLINIC_OR_DEPARTMENT_OTHER): Payer: Self-pay | Admitting: Obstetrics and Gynecology

## 2020-11-10 ENCOUNTER — Emergency Department (HOSPITAL_BASED_OUTPATIENT_CLINIC_OR_DEPARTMENT_OTHER): Payer: Medicare Other | Admitting: Radiology

## 2020-11-10 ENCOUNTER — Other Ambulatory Visit: Payer: Self-pay

## 2020-11-10 DIAGNOSIS — Z79899 Other long term (current) drug therapy: Secondary | ICD-10-CM | POA: Insufficient documentation

## 2020-11-10 DIAGNOSIS — I2510 Atherosclerotic heart disease of native coronary artery without angina pectoris: Secondary | ICD-10-CM | POA: Diagnosis not present

## 2020-11-10 DIAGNOSIS — W1839XA Other fall on same level, initial encounter: Secondary | ICD-10-CM | POA: Diagnosis not present

## 2020-11-10 DIAGNOSIS — Z87891 Personal history of nicotine dependence: Secondary | ICD-10-CM | POA: Insufficient documentation

## 2020-11-10 DIAGNOSIS — E119 Type 2 diabetes mellitus without complications: Secondary | ICD-10-CM | POA: Insufficient documentation

## 2020-11-10 DIAGNOSIS — Z7901 Long term (current) use of anticoagulants: Secondary | ICD-10-CM | POA: Insufficient documentation

## 2020-11-10 DIAGNOSIS — S20211A Contusion of right front wall of thorax, initial encounter: Secondary | ICD-10-CM | POA: Insufficient documentation

## 2020-11-10 DIAGNOSIS — J449 Chronic obstructive pulmonary disease, unspecified: Secondary | ICD-10-CM | POA: Insufficient documentation

## 2020-11-10 DIAGNOSIS — Z7982 Long term (current) use of aspirin: Secondary | ICD-10-CM | POA: Diagnosis not present

## 2020-11-10 DIAGNOSIS — I10 Essential (primary) hypertension: Secondary | ICD-10-CM | POA: Insufficient documentation

## 2020-11-10 DIAGNOSIS — S299XXA Unspecified injury of thorax, initial encounter: Secondary | ICD-10-CM | POA: Diagnosis present

## 2020-11-10 MED ORDER — OXYCODONE-ACETAMINOPHEN 5-325 MG PO TABS
1.0000 | ORAL_TABLET | Freq: Four times a day (QID) | ORAL | 0 refills | Status: DC | PRN
Start: 2020-11-10 — End: 2020-12-18

## 2020-11-10 MED ORDER — OXYCODONE-ACETAMINOPHEN 5-325 MG PO TABS
1.0000 | ORAL_TABLET | Freq: Once | ORAL | Status: AC
  Administered 2020-11-10: 1 via ORAL
  Filled 2020-11-10: qty 1

## 2020-11-10 NOTE — ED Triage Notes (Signed)
Patient reports to the ER for a "fall followup". Patient reports on Friday she was seen for a fall and given x-rays and a CT. Patient reports a large bruise on the right side. Patient reports she was placed on Tramadol for pain but that the pain is getting worse. Patient has increased bruising since the accident.

## 2020-11-10 NOTE — ED Provider Notes (Signed)
[facility] EMERGENCY DEPT Provider Note   CSN: [id number] Arrival date & time: 11/10/20  1101     History Chief Complaint  Patient presents with  . Pain    Tara Fields is a 87 y.o. female.  HPI Had a mechanical fall March 25.  She was seen in the emergency department and at that time had CT head and chest x-ray.  Findings were negative however patient had focal pain on the right chest wall consistent with either occult rib fracture or chest wall strain\contusion.  Patient has been taking tramadol for pain.  She typically takes this for other arthritic pain.  She reports that now she has developed a bruise on the right chest that has become visible.  He also continues to have a lot of pain right in that area.  She reports sometimes she can get comfortable but she cannot lie on that side and if she moves a certain way she gets pretty severe pain.  He does not feel short of breath.  She has not been coughing and does not have fever.  She reports she has been trying her incentive spirometer.  Presents today with persisting pain and now bruising on the chest wall.  No abdominal pain, no vomiting.  Patient is eating and drinking without difficulty.  No lightheadedness or near syncope    Past Medical History:  Diagnosis Date  . Aortic stenosis    s/p TAVR 26 mm Edwards Sapien 3 THV July 2020  . CAD (coronary artery disease)   . COPD (chronic obstructive pulmonary disease) ([place])   . Diabetes mellitus ([place])   . Diverticulitis   . GERD (gastroesophageal reflux disease)   . HTN (hypertension)   . Hyperlipidemia   . Legally blind in left eye, as defined in [place]   . Tobacco abuse     Patient Active Problem List   Diagnosis Date Noted  . COPD (chronic obstructive pulmonary disease) ([place]) 09/02/2020  . Dyslipidemia 09/02/2020  . Acute respiratory failure with hypoxia ([place]) 09/01/2020  . Pulmonary nodule 1 cm or greater in diameter 01/03/2020  . S/P TAVR (transcatheter  aortic valve replacement) 05/03/2019  . Primary osteoarthritis involving multiple joints 05/03/2019  . Other persistent atrial fibrillation ([place]) 05/03/2019  . Bilateral carotid artery stenosis 12/01/2017  . 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/13/2014  . Aortic valve disorder 05/03/2014  . Chronic obstructive bronchitis without exacerbation ([place]) 05/03/2014  . Derangement of left knee 05/03/2014  . Esophageal reflux 05/03/2014  . Essential hypertension 05/03/2014  . Pure hypercholesterolemia 05/03/2014  . Type II diabetes mellitus ([place]) 05/03/2014  . Vitamin D deficiency 05/03/2014    Past Surgical History:  Procedure Laterality Date  . ABDOMINAL HYSTERECTOMY    . APPENDECTOMY    . BACK SURGERY    . CATARACT EXTRACTION, BILATERAL    . CHOLECYSTECTOMY       OB History   No obstetric history on file.     Family History  Problem Relation Age of Onset  . Diabetes Mother   . Stroke Brother   . Cerebral palsy Brother   . Coronary artery disease Neg Hx     Social History   Tobacco Use  . Smoking status: Former Smoker    Packs/day: 1.00    Years: 70.00    Pack years: 70.00    Types: Cigarettes    Quit date: 12/15/2019    Years since quitting: 0.9  . Smokeless tobacco: Never Used  . Tobacco comment: Pt states  quiting a year ago. 01/03/20 ARJ  Substance Use Topics  . Alcohol use: No    Alcohol/week: 0.0 standard drinks  . Drug use: No    Home Medications Prior to Admission medications   Medication Sig Start Date End Date Taking? Authorizing Provider  oxyCODONE-acetaminophen (PERCOCET) 5-325 MG tablet Take 1-2 tablets by mouth every 6 (six) hours as needed for moderate pain. 11/10/20  Yes Charlesetta Shanks, MD  acetaminophen (TYLENOL) 325 MG tablet Take 650 mg by mouth every 6 (six) hours as needed (SEVERE PAIN).    [provider]  amLODipine (NORVASC) 10 MG tablet Take 10 mg by mouth daily.    [provider]  amLODipine (NORVASC) 5 MG tablet TAKE 1  TABLET BY MOUTH EVERY DAY 10/22/20   Nafziger, Tommi Rumps, NP  aspirin 81 MG chewable tablet Chew 1 tablet (81 mg total) by mouth daily. 04/01/20   Nafziger, Tommi Rumps, NP  atorvastatin (LIPITOR) 40 MG tablet TAKE 1 TABLET BY MOUTH DAILY AT 6 PM. 10/09/20   Nafziger, Tommi Rumps, NP  Cholecalciferol (VITAMIN D-3 PO) Take 5,000 Units by mouth 2 (two) times daily.    [provider]  doxycycline (VIBRAMYCIN) 100 MG capsule Take 1 capsule (100 mg total) by mouth 2 (two) times daily. 09/25/20   Nafziger, Tommi Rumps, NP  ELIQUIS 5 MG TABS tablet TAKE 1 TABLET BY MOUTH TWICE A DAY 10/22/20   Nafziger, Tommi Rumps, NP  fenofibrate 160 MG tablet TAKE 1 TABLET BY MOUTH EVERY DAY 10/22/20   Nafziger, Tommi Rumps, NP  ferrous gluconate (FERGON) 324 MG tablet Take 1 tablet (324 mg total) by mouth daily with breakfast. 10/09/20   Nafziger, Tommi Rumps, NP  furosemide (LASIX) 20 MG tablet Take 1 tablet (20 mg total) by mouth daily. 09/25/20   Nafziger, Tommi Rumps, NP  losartan (COZAAR) 100 MG tablet TAKE 1 TABLET BY MOUTH EVERY DAY 10/09/20   Burnell Blanks, MD  metoprolol tartrate (LOPRESSOR) 50 MG tablet TAKE 1 TABLET BY MOUTH TWICE A DAY 10/22/20   Nafziger, Tommi Rumps, NP  Multiple Vitamins-Minerals (WOMENS 50+ [place] VITAMIN/MIN PO) Take 1 tablet by mouth daily. CENTRUM SILVER    [provider]  NASAL SPRAY SALINE NA Place into the nose.    [provider]  potassium chloride (KLOR-CON) 10 MEQ tablet Take 1 tablet (10 mEq total) by mouth daily. 09/25/20   Nafziger, Tommi Rumps, NP  traMADol (ULTRAM) 50 MG tablet TAKE 1 TABLET (50 MG TOTAL) BY MOUTH IN THE MORNING, AT NOON, AND AT BEDTIME. 10/03/20   Nafziger, Tommi Rumps, NP    Allergies    Amlodipine besy-benazepril hcl, Hctz [hydrochlorothiazide], Diclofenac sodium, Hydralazine, and Methylprednisolone  Review of Systems   Review of Systems 10 systems reviewed and negative except as per HPI Physical Exam Updated Vital Signs BP (!) 147/77 (BP Location: Right Arm)   Pulse 83   Temp 97.7 F (36.5 C)  (Oral)   Resp 18   Ht 5' (1.524 m)   Wt 68 kg   SpO2 96%   BMI 29.28 kg/m   Physical Exam Constitutional:      Comments: Alert and nontoxic.  Good physical condition for age.  HENT:     Head: Normocephalic and atraumatic.     Mouth/Throat:     Pharynx: Oropharynx is clear.  Cardiovascular:     Rate and Rhythm: Normal rate and regular rhythm.     Comments: 2 out of 6 systolic ejection murmur, split S2.  Occasional ectopy. Pulmonary:     Effort: Pulmonary effort is  normal.     Breath sounds: Normal breath sounds.     Comments: Patient has good breath sounds bilaterally.  There are no crackles or wheezing.  Good airflow of the right lower and axillary lung fields.  Patient has a oval bruise approximately 8 cm on the lateral chest wall at about ribs 8 through 10.  No associated hematoma.  Associated erythema.  Patient is very tender on the chest wall from this area inferiorly and medially.  No appreciable crepitus.  Other soft tissues are normal. Abdominal:     General: There is no distension.     Palpations: Abdomen is soft.     Tenderness: There is no abdominal tenderness. There is no guarding.  Musculoskeletal:        General: No swelling or tenderness. Normal range of motion.     Right lower leg: No edema.     Left lower leg: No edema.     Comments: No lower extremity edema.  Calves are soft and nontender peer  Skin:    General: Skin is warm and dry.  Neurological:     General: No focal deficit present.     Mental Status: She is oriented to person, place, and time.     Coordination: Coordination normal.  Psychiatric:        Mood and Affect: Mood normal.     ED Results / Procedures / Treatments   Labs (all labs ordered are listed, but only abnormal results are displayed) Labs Reviewed - No data to display  EKG None  Radiology DG Ribs Unilateral W/Chest Right  Result Date: 11/10/2020 CLINICAL DATA:  Fall, pain EXAM: RIGHT RIBS AND CHEST - 3+ VIEW COMPARISON:  None.  FINDINGS: No fracture or other bone lesions are seen involving the ribs. There is no evidence of pneumothorax or pleural effusion. Unchanged bandlike post treatment scarring of the left upper lobe. Emphysema. Cardiomegaly. IMPRESSION: 1. No acute abnormality of the right ribs. No displaced rib fracture. No pneumothorax or pleural effusion. 2. Emphysema. 3. Unchanged bandlike post treatment scarring of the left upper lobe. 4. Cardiomegaly. Electronically Signed   By: Eddie Candle M.D.   On: 11/10/2020 11:48    Procedures Procedures   Medications Ordered in ED Medications  oxyCODONE-acetaminophen (PERCOCET/ROXICET) 5-325 MG per tablet 1 tablet (1 tablet Oral Given 11/10/20 1128)    ED Course  I have reviewed the triage vital signs and the nursing notes.  Pertinent labs & imaging results that were available during my care of the patient were reviewed by me and considered in my medical decision making (see chart for details).    MDM Rules/Calculators/A&P                         Chest x-ray does not show any interim development of pleural effusion, infiltrate or pneumothorax.  Patient does not [place] shortness of breath.  Her lungs are clear in the area of her pain.  She clearly has focal bruising and reproducible chest wall pain consistent with chest wall injury.  Still consideration for occult rib fracture.  However patient is eating well she is not having lightheadedness or dizziness and she is not having abdominal pain.  Vital signs are stable and there is no hypoxia.  At this time, it does not appear that she needs other imaging based on clinical findings.  Needs additional pain control.  Will change from tramadol to 1 Percocet every 6 hours with an extra strength  Tylenol tablet.  Return precautions provided and follow-up plan for recheck within the next week or less. Final Clinical Impression(s) / ED Diagnoses Final diagnoses:  Chest wall contusion, right, initial encounter    Rx / DC  Orders ED Discharge Orders         Ordered    oxyCODONE-acetaminophen (PERCOCET) 5-325 MG tablet  Every 6 hours PRN        11/10/20 1207           Charlesetta Shanks, MD 11/10/20 1211

## 2020-11-10 NOTE — ED Notes (Signed)
Pt discharged home after verbalizing understanding of discharge instructions; nad noted. 

## 2020-11-10 NOTE — ED Notes (Signed)
Patient transported to X-ray 

## 2020-11-10 NOTE — ED Notes (Signed)
Patient returned from X-ray 

## 2020-11-10 NOTE — Discharge Instructions (Signed)
1.  Rib fractures and chest wall strains can be painful for weeks.  Take 1 Percocet tablet every 6 hours for severe pain.  With the Percocet tablet, you may take 1 extra strength tablet of Tylenol (500mg ), this makes your total Tylenol dose for 6 hours 825 mg.  Do not exceed that amount of Tylenol every 6 hours.  2.  You may use warm moist heat, heating pads or lidocaine patches on your ribs.  Do which ever gives you the most pain relief.  Continue to use your incentive spirometer and move around as much as possible. 3.  Return to the emergency department immediately if you develop sudden shortness of breath, progressively worsening pain, fever and cough, lightheadedness or feeling like he will pass out, vomiting or other concerning symptoms

## 2020-12-05 ENCOUNTER — Other Ambulatory Visit: Payer: Self-pay | Admitting: Cardiovascular Disease

## 2020-12-11 ENCOUNTER — Other Ambulatory Visit: Payer: Self-pay | Admitting: Adult Health

## 2020-12-11 NOTE — Telephone Encounter (Signed)
Please advise. This medication has been discontinued by another provider. Should the patient continue taking this?

## 2020-12-18 ENCOUNTER — Emergency Department (HOSPITAL_BASED_OUTPATIENT_CLINIC_OR_DEPARTMENT_OTHER): Payer: Medicare Other

## 2020-12-18 ENCOUNTER — Other Ambulatory Visit: Payer: Self-pay | Admitting: Cardiovascular Disease

## 2020-12-18 ENCOUNTER — Encounter (HOSPITAL_BASED_OUTPATIENT_CLINIC_OR_DEPARTMENT_OTHER): Payer: Self-pay | Admitting: *Deleted

## 2020-12-18 ENCOUNTER — Other Ambulatory Visit: Payer: Self-pay

## 2020-12-18 ENCOUNTER — Ambulatory Visit: Payer: Medicare Other | Admitting: Adult Health

## 2020-12-18 ENCOUNTER — Inpatient Hospital Stay (HOSPITAL_BASED_OUTPATIENT_CLINIC_OR_DEPARTMENT_OTHER)
Admission: EM | Admit: 2020-12-18 | Discharge: 2020-12-21 | DRG: 291 | Disposition: A | Discharge status: Home Health | Payer: Medicare Other | Attending: Family Medicine | Admitting: Family Medicine

## 2020-12-18 DIAGNOSIS — H548 Legal blindness, as defined in USA: Secondary | ICD-10-CM | POA: Diagnosis present

## 2020-12-18 DIAGNOSIS — I110 Hypertensive heart disease with heart failure: Secondary | ICD-10-CM | POA: Diagnosis present

## 2020-12-18 DIAGNOSIS — Z888 Allergy status to other drugs, medicaments and biological substances status: Secondary | ICD-10-CM | POA: Diagnosis not present

## 2020-12-18 DIAGNOSIS — K219 Gastro-esophageal reflux disease without esophagitis: Secondary | ICD-10-CM | POA: Diagnosis present

## 2020-12-18 DIAGNOSIS — Z952 Presence of prosthetic heart valve: Secondary | ICD-10-CM

## 2020-12-18 DIAGNOSIS — I482 Chronic atrial fibrillation, unspecified: Secondary | ICD-10-CM | POA: Diagnosis present

## 2020-12-18 DIAGNOSIS — I5023 Acute on chronic systolic (congestive) heart failure: Secondary | ICD-10-CM | POA: Diagnosis not present

## 2020-12-18 DIAGNOSIS — E119 Type 2 diabetes mellitus without complications: Secondary | ICD-10-CM | POA: Diagnosis present

## 2020-12-18 DIAGNOSIS — Z923 Personal history of irradiation: Secondary | ICD-10-CM | POA: Diagnosis not present

## 2020-12-18 DIAGNOSIS — J440 Chronic obstructive pulmonary disease with acute lower respiratory infection: Secondary | ICD-10-CM | POA: Diagnosis present

## 2020-12-18 DIAGNOSIS — Z20822 Contact with and (suspected) exposure to covid-19: Secondary | ICD-10-CM | POA: Diagnosis present

## 2020-12-18 DIAGNOSIS — J9601 Acute respiratory failure with hypoxia: Secondary | ICD-10-CM | POA: Diagnosis present

## 2020-12-18 DIAGNOSIS — C3432 Malignant neoplasm of lower lobe, left bronchus or lung: Secondary | ICD-10-CM | POA: Diagnosis present

## 2020-12-18 DIAGNOSIS — Z79899 Other long term (current) drug therapy: Secondary | ICD-10-CM | POA: Diagnosis not present

## 2020-12-18 DIAGNOSIS — I509 Heart failure, unspecified: Secondary | ICD-10-CM

## 2020-12-18 DIAGNOSIS — R45851 Suicidal ideations: Secondary | ICD-10-CM | POA: Diagnosis not present

## 2020-12-18 DIAGNOSIS — I472 Ventricular tachycardia: Secondary | ICD-10-CM | POA: Diagnosis not present

## 2020-12-18 DIAGNOSIS — Z7901 Long term (current) use of anticoagulants: Secondary | ICD-10-CM

## 2020-12-18 DIAGNOSIS — L8990 Pressure ulcer of unspecified site, unspecified stage: Secondary | ICD-10-CM | POA: Diagnosis present

## 2020-12-18 DIAGNOSIS — Z7982 Long term (current) use of aspirin: Secondary | ICD-10-CM | POA: Diagnosis not present

## 2020-12-18 DIAGNOSIS — I4891 Unspecified atrial fibrillation: Secondary | ICD-10-CM | POA: Diagnosis not present

## 2020-12-18 DIAGNOSIS — Z87891 Personal history of nicotine dependence: Secondary | ICD-10-CM

## 2020-12-18 DIAGNOSIS — I2510 Atherosclerotic heart disease of native coronary artery without angina pectoris: Secondary | ICD-10-CM | POA: Diagnosis present

## 2020-12-18 DIAGNOSIS — J189 Pneumonia, unspecified organism: Secondary | ICD-10-CM | POA: Diagnosis present

## 2020-12-18 DIAGNOSIS — Z885 Allergy status to narcotic agent status: Secondary | ICD-10-CM | POA: Diagnosis not present

## 2020-12-18 DIAGNOSIS — R4189 Other symptoms and signs involving cognitive functions and awareness: Secondary | ICD-10-CM | POA: Diagnosis not present

## 2020-12-18 DIAGNOSIS — R410 Disorientation, unspecified: Secondary | ICD-10-CM | POA: Diagnosis not present

## 2020-12-18 DIAGNOSIS — I5043 Acute on chronic combined systolic (congestive) and diastolic (congestive) heart failure: Secondary | ICD-10-CM | POA: Diagnosis present

## 2020-12-18 DIAGNOSIS — I5031 Acute diastolic (congestive) heart failure: Secondary | ICD-10-CM | POA: Diagnosis not present

## 2020-12-18 DIAGNOSIS — E785 Hyperlipidemia, unspecified: Secondary | ICD-10-CM | POA: Diagnosis present

## 2020-12-18 DIAGNOSIS — Z833 Family history of diabetes mellitus: Secondary | ICD-10-CM | POA: Diagnosis not present

## 2020-12-18 HISTORY — DX: Heart failure, unspecified: I50.9

## 2020-12-18 LAB — MAGNESIUM: Magnesium: 1.8 mg/dL (ref 1.7–2.4)

## 2020-12-18 LAB — BRAIN NATRIURETIC PEPTIDE: B Natriuretic Peptide: 1444.7 pg/mL — ABNORMAL HIGH (ref 0.0–100.0)

## 2020-12-18 LAB — CBC WITH DIFFERENTIAL/PLATELET
Abs Immature Granulocytes: 0.02 10*3/uL (ref 0.00–0.07)
Basophils Absolute: 0 10*3/uL (ref 0.0–0.1)
Basophils Relative: 1 %
Eosinophils Absolute: 0 10*3/uL (ref 0.0–0.5)
Eosinophils Relative: 1 %
HCT: 38.8 % (ref 36.0–46.0)
Hemoglobin: 12.2 g/dL (ref 12.0–15.0)
Immature Granulocytes: 0 %
Lymphocytes Relative: 6 %
Lymphs Abs: 0.4 10*3/uL — ABNORMAL LOW (ref 0.7–4.0)
MCH: 28.8 pg (ref 26.0–34.0)
MCHC: 31.4 g/dL (ref 30.0–36.0)
MCV: 91.5 fL (ref 80.0–100.0)
Monocytes Absolute: 0.4 10*3/uL (ref 0.1–1.0)
Monocytes Relative: 6 %
Neutro Abs: 5.5 10*3/uL (ref 1.7–7.7)
Neutrophils Relative %: 86 %
Platelets: 271 10*3/uL (ref 150–400)
RBC: 4.24 MIL/uL (ref 3.87–5.11)
RDW: 15.9 % — ABNORMAL HIGH (ref 11.5–15.5)
WBC: 6.4 10*3/uL (ref 4.0–10.5)
nRBC: 0 % (ref 0.0–0.2)

## 2020-12-18 LAB — COMPREHENSIVE METABOLIC PANEL
ALT: 13 U/L (ref 0–44)
AST: 16 U/L (ref 15–41)
Albumin: 3.8 g/dL (ref 3.5–5.0)
Alkaline Phosphatase: 41 U/L (ref 38–126)
Anion gap: 10 (ref 5–15)
BUN: 16 mg/dL (ref 8–23)
CO2: 24 mmol/L (ref 22–32)
Calcium: 9.1 mg/dL (ref 8.9–10.3)
Chloride: 105 mmol/L (ref 98–111)
Creatinine, Ser: 0.75 mg/dL (ref 0.44–1.00)
GFR, Estimated: >60 mL/min (ref >60)
Glucose, Bld: 185 mg/dL — ABNORMAL HIGH (ref 70–99)
Potassium: 3.6 mmol/L (ref 3.5–5.1)
Sodium: 139 mmol/L (ref 135–145)
Total Bilirubin: 0.9 mg/dL (ref 0.3–1.2)
Total Protein: 6.5 g/dL (ref 6.5–8.1)

## 2020-12-18 LAB — TROPONIN I (HIGH SENSITIVITY)
Troponin I (High Sensitivity): 8 ng/L (ref <18)
Troponin I (High Sensitivity): 10 ng/L (ref <18)

## 2020-12-18 LAB — RESP PANEL BY RT-PCR (FLU A&B, COVID) ARPGX2
Influenza A by PCR: NEGATIVE
Influenza B by PCR: NEGATIVE
SARS Coronavirus 2 by RT PCR: NEGATIVE

## 2020-12-18 LAB — PHOSPHORUS: Phosphorus: 3.5 mg/dL (ref 2.5–4.6)

## 2020-12-18 LAB — LIPASE, BLOOD: Lipase: <10 U/L — ABNORMAL LOW (ref 11–51)

## 2020-12-18 MED ORDER — POTASSIUM CHLORIDE CRYS ER 10 MEQ PO TBCR
10.0000 meq | EXTENDED_RELEASE_TABLET | Freq: Every day | ORAL | Status: DC
  Administered 2020-12-19 – 2020-12-21 (×3): 10 meq via ORAL
  Filled 2020-12-18 (×4): qty 1

## 2020-12-18 MED ORDER — SODIUM CHLORIDE 0.9% FLUSH
3.0000 mL | Freq: Two times a day (BID) | INTRAVENOUS | Status: DC
  Administered 2020-12-19 – 2020-12-20 (×3): 3 mL via INTRAVENOUS

## 2020-12-18 MED ORDER — ONDANSETRON HCL 4 MG/2ML IJ SOLN: 4.0000 mg | Freq: Four times a day (QID) | INTRAMUSCULAR | Status: DC | PRN

## 2020-12-18 MED ORDER — TRAMADOL HCL 50 MG PO TABS
50.0000 mg | ORAL_TABLET | Freq: Two times a day (BID) | ORAL | Status: DC | PRN
  Administered 2020-12-19: 50 mg via ORAL
  Filled 2020-12-18: qty 1

## 2020-12-18 MED ORDER — SODIUM CHLORIDE 0.9 % IV SOLN: 250.0000 mL | INTRAVENOUS | Status: DC | PRN

## 2020-12-18 MED ORDER — FUROSEMIDE 10 MG/ML IJ SOLN
40.0000 mg | Freq: Every day | INTRAMUSCULAR | Status: DC
  Administered 2020-12-19 – 2020-12-20 (×2): 40 mg via INTRAVENOUS
  Filled 2020-12-18 (×2): qty 4

## 2020-12-18 MED ORDER — ACETAMINOPHEN 325 MG PO TABS: 650.0000 mg | ORAL_TABLET | Freq: Four times a day (QID) | ORAL | Status: DC | PRN

## 2020-12-18 MED ORDER — LOSARTAN POTASSIUM 50 MG PO TABS
100.0000 mg | ORAL_TABLET | Freq: Every day | ORAL | Status: DC
  Administered 2020-12-19 – 2020-12-21 (×3): 100 mg via ORAL
  Filled 2020-12-18 (×3): qty 2

## 2020-12-18 MED ORDER — TERAZOSIN HCL 5 MG PO CAPS
10.0000 mg | ORAL_CAPSULE | Freq: Every day | ORAL | Status: DC
  Administered 2020-12-18 – 2020-12-21 (×4): 10 mg via ORAL
  Filled 2020-12-18 (×4): qty 2

## 2020-12-18 MED ORDER — VITAMIN D 25 MCG (1000 UNIT) PO TABS
5000.0000 [IU] | ORAL_TABLET | Freq: Two times a day (BID) | ORAL | Status: DC
  Administered 2020-12-19 – 2020-12-21 (×5): 5000 [IU] via ORAL
  Filled 2020-12-18 (×5): qty 5

## 2020-12-18 MED ORDER — FUROSEMIDE 10 MG/ML IJ SOLN
40.0000 mg | Freq: Once | INTRAMUSCULAR | Status: AC
  Administered 2020-12-18: 40 mg via INTRAVENOUS
  Filled 2020-12-18: qty 4

## 2020-12-18 MED ORDER — LABETALOL HCL 200 MG PO TABS: 200.0000 mg | ORAL_TABLET | Freq: Three times a day (TID) | ORAL | Status: DC | PRN

## 2020-12-18 MED ORDER — MELATONIN 3 MG PO TABS
3.0000 mg | ORAL_TABLET | Freq: Every day | ORAL | Status: DC
  Administered 2020-12-20: 3 mg via ORAL
  Filled 2020-12-18 (×2): qty 1

## 2020-12-18 MED ORDER — LOSARTAN POTASSIUM 50 MG PO TABS: 100.0000 mg | ORAL_TABLET | Freq: Every day | ORAL | Status: DC

## 2020-12-18 MED ORDER — SODIUM CHLORIDE 0.9% FLUSH: 3.0000 mL | INTRAVENOUS | Status: DC | PRN

## 2020-12-18 MED ORDER — AMLODIPINE BESYLATE 5 MG PO TABS
5.0000 mg | ORAL_TABLET | Freq: Every day | ORAL | Status: DC
  Administered 2020-12-19 – 2020-12-20 (×2): 5 mg via ORAL
  Filled 2020-12-18 (×2): qty 1

## 2020-12-18 MED ORDER — ATORVASTATIN CALCIUM 40 MG PO TABS
40.0000 mg | ORAL_TABLET | Freq: Every day | ORAL | Status: DC
  Administered 2020-12-18 – 2020-12-21 (×4): 40 mg via ORAL
  Filled 2020-12-18 (×4): qty 1

## 2020-12-18 MED ORDER — POTASSIUM CHLORIDE CRYS ER 20 MEQ PO TBCR
40.0000 meq | EXTENDED_RELEASE_TABLET | Freq: Once | ORAL | Status: AC
  Administered 2020-12-18: 40 meq via ORAL
  Filled 2020-12-18: qty 2

## 2020-12-18 MED ORDER — IOHEXOL 350 MG/ML SOLN
75.0000 mL | Freq: Once | INTRAVENOUS | Status: AC | PRN
  Administered 2020-12-18: 75 mL via INTRAVENOUS

## 2020-12-18 MED ORDER — ACETAMINOPHEN 325 MG PO TABS: 650.0000 mg | ORAL_TABLET | ORAL | Status: DC | PRN

## 2020-12-18 MED ORDER — ASPIRIN 81 MG PO CHEW
81.0000 mg | CHEWABLE_TABLET | Freq: Every day | ORAL | Status: DC
  Administered 2020-12-19 – 2020-12-21 (×3): 81 mg via ORAL
  Filled 2020-12-18 (×3): qty 1

## 2020-12-18 MED ORDER — METOPROLOL TARTRATE 50 MG PO TABS
50.0000 mg | ORAL_TABLET | Freq: Two times a day (BID) | ORAL | Status: DC
  Administered 2020-12-19 – 2020-12-21 (×5): 50 mg via ORAL
  Filled 2020-12-18 (×5): qty 1

## 2020-12-18 MED ORDER — FERROUS GLUCONATE 324 (38 FE) MG PO TABS
324.0000 mg | ORAL_TABLET | Freq: Every day | ORAL | Status: DC
  Administered 2020-12-20 – 2020-12-21 (×2): 324 mg via ORAL
  Filled 2020-12-18 (×3): qty 1

## 2020-12-18 MED ORDER — FENOFIBRATE 160 MG PO TABS
160.0000 mg | ORAL_TABLET | Freq: Every day | ORAL | Status: DC
  Administered 2020-12-18 – 2020-12-21 (×4): 160 mg via ORAL
  Filled 2020-12-18 (×4): qty 1

## 2020-12-18 MED ORDER — APIXABAN 5 MG PO TABS
5.0000 mg | ORAL_TABLET | Freq: Two times a day (BID) | ORAL | Status: DC
  Administered 2020-12-19 – 2020-12-21 (×5): 5 mg via ORAL
  Filled 2020-12-18 (×5): qty 1

## 2020-12-18 NOTE — Progress Notes (Signed)
Patient is irritated and saying her son and everybody else is trying to take her money. Patient denies being in [facility] despite being told that she is. Patient also refusing to take night time medications. Attempts made to teach patient about the ordered medications, the rationale and benefits for taking them. Patient still refuses stating that she doesn't have to take anything and we can't make her.

## 2020-12-18 NOTE — ED Notes (Signed)
ED Provider at bedside. 

## 2020-12-18 NOTE — H&P (Signed)
History and Physical    Tara Fields HUD:[id number] DOB: 1928-02-10 DOA: 12/18/2020  PCP: [name], NP (Confirm with patient/family/NH records and if not entered, this has to be entered at [facility] point of entry) Patient coming from:[facility]  I have personally briefly reviewed patient's old medical records in [place]  Chief Complaint: SOB, chest pain  HPI: Tara Fields is a 87 y.o. female with medical history significant of chronic systolic CHF (LVEF 45 to 85% in 2020 at [facility]), HTN, aortic stenosis s/p TAVR 2018, paroxysmal A. fib on Eliquis, presented with increasing shortness of breath, chest pain and leg swelling.  Symptoms started 2 weeks ago, currently getting worse, with exertion no dyspnea.  Chest pain is on lower chest, aching-like, cannot take deep breath, worsening when lying down and partly relieved when sitting up or standing up.  Also developed orthopnea since last week.  Started to see leg swelling in the last 2 weeks.  Patient denies any palpitation sensations, no nauseous vomit no diarrhea no cough no fever chills. ED Course: Patient went to [facility] ED, CT angiogram negative for PE but positive for bilateral pulmonary congestion and bilateral pleural effusion.  Patient also developed rapid A. fib with heart rate in the 150s however resolved by itself.  Received 1 dose of 40 mg IV Lasix and urine output around 1 L and the symptoms significantly improved afterwards.  However still on 2 L oxygen  Review of Systems: As per HPI otherwise 14 point review of systems negative.    Past Medical History:  Diagnosis Date  . Aortic stenosis    s/p TAVR 26 mm Edwards Sapien 3 THV July 2020  . CAD (coronary artery disease)   . COPD (chronic obstructive pulmonary disease) ([place])   . Diabetes mellitus ([place])   . Diverticulitis   . GERD (gastroesophageal reflux disease)   . HTN (hypertension)   . Hyperlipidemia   . Legally blind in left eye, as defined in [place]    . Tobacco abuse     Past Surgical History:  Procedure Laterality Date  . ABDOMINAL HYSTERECTOMY    . APPENDECTOMY    . BACK SURGERY    . CATARACT EXTRACTION, BILATERAL    . CHOLECYSTECTOMY       reports that she quit smoking about a year ago. Her smoking use included cigarettes. She has a 70.00 pack-year smoking history. She has never used smokeless tobacco. She reports that she does not drink alcohol and does not use drugs.  Allergies  Allergen Reactions  . Amlodipine Besy-Benazepril Hcl Other (See Comments)    cough  . Hctz [Hydrochlorothiazide] Other (See Comments)  . Diclofenac Sodium Diarrhea  . Hydralazine     (Apresoline *ANTIHYPERTENSIVES*) HEPATITIS  . Methylprednisolone     (Medrol (Pak) *CORTICOSTEROIDS*) STATES ANGER, BUT ALOT OF ENERGY    Family History  Problem Relation Age of Onset  . Diabetes Mother   . Stroke Brother   . Cerebral palsy Brother   . Coronary artery disease Neg Hx      Prior to Admission medications   Medication Sig Start Date End Date Taking? Authorizing Provider  amLODipine (NORVASC) 5 MG tablet TAKE 1 TABLET BY MOUTH EVERY DAY 10/22/20  Yes Nafziger, Tommi Rumps, NP  aspirin 81 MG chewable tablet Chew 1 tablet (81 mg total) by mouth daily. 04/01/20  Yes Nafziger, Tommi Rumps, NP  atorvastatin (LIPITOR) 40 MG tablet TAKE 1 TABLET BY MOUTH DAILY AT 6 PM. 10/09/20  Yes Nafziger, Tommi Rumps, NP  Cholecalciferol (VITAMIN D-3 PO) Take 5,000 Units by mouth 2 (two) times daily.   Yes [provider]  ELIQUIS 5 MG TABS tablet TAKE 1 TABLET BY MOUTH TWICE A DAY 10/22/20  Yes Nafziger, Tommi Rumps, NP  fenofibrate 160 MG tablet TAKE 1 TABLET BY MOUTH EVERY DAY 10/22/20  Yes Nafziger, Tommi Rumps, NP  ferrous gluconate (FERGON) 324 MG tablet Take 1 tablet (324 mg total) by mouth daily with breakfast. 10/09/20  Yes Nafziger, Tommi Rumps, NP  losartan (COZAAR) 100 MG tablet TAKE 1 TABLET BY MOUTH EVERY DAY 11/10/20  Yes Burnell Blanks, MD  metoprolol tartrate (LOPRESSOR) 50 MG  tablet TAKE 1 TABLET BY MOUTH TWICE A DAY 10/22/20  Yes Nafziger, Tommi Rumps, NP  Multiple Vitamins-Minerals (WOMENS 50+ [place] VITAMIN/MIN PO) Take 1 tablet by mouth daily. CENTRUM SILVER   Yes [provider]  NASAL SPRAY SALINE NA Place into the nose.   Yes [provider]  terazosin (HYTRIN) 10 MG capsule Take 1 capsule (10 mg total) by mouth daily at 12 noon. 12/16/20  Yes Nafziger, Tommi Rumps, NP  traMADol (ULTRAM) 50 MG tablet TAKE 1 TABLET (50 MG TOTAL) BY MOUTH IN THE MORNING, AT NOON, AND AT BEDTIME. 10/03/20  Yes Nafziger, Tommi Rumps, NP  acetaminophen (TYLENOL) 325 MG tablet Take 650 mg by mouth every 6 (six) hours as needed (SEVERE PAIN).    [provider]  potassium chloride (KLOR-CON) 10 MEQ tablet Take 1 tablet (10 mEq total) by mouth daily. 09/25/20   [name], NP    Physical Exam: Vitals:   12/18/20 1130 12/18/20 1230 12/18/20 1245 12/18/20 1519  BP: (!) 152/95 (!) 111/99 112/76 (!) 152/78  Pulse: (!) 101 93 98 78  Resp: (!) 23 (!) 21 20 18   Temp:    (!) 97.5 F (36.4 C)  TempSrc:    Oral  SpO2: 100% 100% 100% 100%  Weight:      Height:        Constitutional: NAD, calm, comfortable Vitals:   12/18/20 1130 12/18/20 1230 12/18/20 1245 12/18/20 1519  BP: (!) 152/95 (!) 111/99 112/76 (!) 152/78  Pulse: (!) 101 93 98 78  Resp: (!) 23 (!) 21 20 18   Temp:    (!) 97.5 F (36.4 C)  TempSrc:    Oral  SpO2: 100% 100% 100% 100%  Weight:      Height:       Eyes: PERRL, lids and conjunctivae normal ENMT: Mucous membranes are moist. Posterior pharynx clear of any exudate or lesions.Normal dentition.  Neck: normal, supple, no masses, no thyromegaly Respiratory: clear to auscultation bilaterally, no wheezing, fine crackles on B/L bases. Increasing respiratory effort. No accessory muscle use.  Cardiovascular: Regular rate and rhythm, systolic murmur on carb-based. Trace extremity edema. 2+ pedal pulses. No carotid bruits.  Abdomen: no tenderness, no masses palpated.  No hepatosplenomegaly. Bowel sounds positive.  Musculoskeletal: no clubbing / cyanosis. No joint deformity upper and lower extremities. Good ROM, no contractures. Normal muscle tone.  Skin: no rashes, lesions, ulcers. No induration Neurologic: CN 2-12 grossly intact. Sensation intact, DTR normal. Strength 5/5 in all 4.  Psychiatric: Normal judgment and insight. Alert and oriented x 3. Normal mood.     Labs on Admission: I have personally reviewed following labs and imaging studies  CBC: Recent Labs  Lab 12/18/20 0825  WBC 6.4  NEUTROABS 5.5  HGB 12.2  HCT 38.8  MCV 91.5  PLT 035   Basic Metabolic Panel: Recent Labs  Lab 12/18/20 0825  NA  139  K 3.6  CL 105  CO2 24  GLUCOSE 185*  BUN 16  CREATININE 0.75  CALCIUM 9.1   GFR: Estimated Creatinine Clearance: 41.3 mL/min (by C-G formula based on SCr of 0.75 mg/dL). Liver Function Tests: Recent Labs  Lab 12/18/20 0825  AST 16  ALT 13  ALKPHOS 41  BILITOT 0.9  PROT 6.5  ALBUMIN 3.8   Recent Labs  Lab 12/18/20 0825  LIPASE <10*   No results for input(s): AMMONIA in the last 168 hours. Coagulation Profile: No results for input(s): INR, PROTIME in the last 168 hours. Cardiac Enzymes: No results for input(s): CKTOTAL, CKMB, CKMBINDEX, TROPONINI in the last 168 hours. BNP (last 3 results) No results for input(s): PROBNP in the last 8760 hours. HbA1C: No results for input(s): HGBA1C in the last 72 hours. CBG: No results for input(s): GLUCAP in the last 168 hours. Lipid Profile: No results for input(s): CHOL, HDL, LDLCALC, TRIG, CHOLHDL, LDLDIRECT in the last 72 hours. Thyroid Function Tests: No results for input(s): TSH, T4TOTAL, FREET4, T3FREE, THYROIDAB in the last 72 hours. Anemia Panel: No results for input(s): VITAMINB12, FOLATE, FERRITIN, TIBC, IRON, RETICCTPCT in the last 72 hours. Urine analysis:    Component Value Date/Time   COLORURINE YELLOW 09/01/2020 1403   APPEARANCEUR HAZY (A) 09/01/2020 1403    LABSPEC 1.019 09/01/2020 1403   PHURINE 6.0 09/01/2020 1403   GLUCOSEU NEGATIVE 09/01/2020 1403   GLUCOSEU NEGATIVE 12/11/2019 1033   HGBUR NEGATIVE 09/01/2020 [place] 09/01/2020 1403   [place] 09/01/2020 1403   PROTEINUR 30 (A) 09/01/2020 1403   UROBILINOGEN 0.2 12/11/2019 1033   NITRITE NEGATIVE 09/01/2020 1403   LEUKOCYTESUR TRACE (A) 09/01/2020 1403    Radiological Exams on Admission: CT Angio Chest PE W/Cm &/Or Wo Cm  Result Date: 12/18/2020 CLINICAL DATA:  Right AND Left upper quadrant pain since Tuesday and nausea. Increase SOB. History of radiation therapy left lung carcinoma. EXAM: CT ANGIOGRAPHY CHEST CT ABDOMEN AND PELVIS WITH CONTRAST TECHNIQUE: Multidetector CT imaging of the chest was performed using the standard protocol during bolus administration of intravenous contrast. Multiplanar CT image reconstructions and MIPs were obtained to evaluate the vascular anatomy. Multidetector CT imaging of the abdomen and pelvis was performed using the standard protocol during bolus administration of intravenous contrast. CONTRAST:  92mL OMNIPAQUE IOHEXOL 350 MG/ML SOLN COMPARISON:  09/01/2020 FINDINGS: CTA CHEST FINDINGS Cardiovascular: Pulmonary arteries are well opacified. There is no evidence of a pulmonary embolism. Heart is mildly enlarged. Stable changes from previous aortic valve replacement. Three-vessel coronary artery calcifications. No pericardial effusion. Aorta is unopacified. Are aortic and arch branch vessel atherosclerotic calcifications. Mediastinum/Nodes: Normal thyroid. No neck base or axillary masses or enlarged lymph nodes. Mildly enlarged right paratracheal, azygos level lymph node measuring 1.4 cm in short axis. No mediastinal or hilar masses. No other enlarged lymph nodes. Trachea and esophagus are unremarkable. Lungs/Pleura: Bilateral interstitial thickening. Consolidation associated with bronchiectasis extends from the hilum into the left  upper lobe. Small areas of opacity are noted at the lung bases consistent with atelectasis. Small bilateral pleural effusions. No pneumothorax. Musculoskeletal: Old right rib fractures. No acute fracture. No bone lesion. No chest wall masses. Review of the MIP images confirms the above findings. CT ABDOMEN and PELVIS FINDINGS Hepatobiliary: Liver normal in size and attenuation. No masses. Gallbladder surgically absent. Common bile duct dilated to a maximum 1.8 cm with distal tapering, unchanged. No evidence of a duct stone. Pancreas: Unremarkable. No pancreatic ductal dilatation  or surrounding inflammatory changes. Spleen: Normal in size without focal abnormality. Adrenals/Urinary Tract: 2.1 cm left adrenal mass, unchanged from the previous CT scan and stable from a chest CT 12/27/2019. Normal right adrenal gland Kidneys normal in size, orientation and position. Symmetric renal enhancement and excretion. Two low-density left renal masses, largest, 1.9 cm, a lower pole renal sinus mass, both consistent with cysts and both stable. No other masses, no stones and no hydronephrosis. Normal ureters. Normal bladder. Stomach/Bowel: Normal stomach. Small bowel and colon are normal in caliber. No wall thickening. No inflammation. Numerous colonic diverticula most evident along the sigmoid. Mild generalized increase in the colonic stool burden. Vascular/Lymphatic: Dense aortic atherosclerosis. No aneurysm. No enlarged lymph nodes. Reproductive: Uterus and bilateral adnexa are unremarkable. Other: Small midline hernia contains a knuckle of the transverse colon, unchanged from the prior study. No ascites. Musculoskeletal: No fracture. No bone lesion. Previous L4 through S1 posterior lumbar spine fusion, stable from the prior exam. Review of the MIP images confirms the above findings. IMPRESSION: CHEST CTA 1. No evidence of a pulmonary embolism. 2. Congestive heart failure diffuse bilateral interstitial thickening and small  bilateral pleural effusions. 3. Left lung opacity extending superiorly from the left hilum consistent with post radiation scarring, stable from the prior CT. 4. Mild dependent atelectasis. 5. Changes from aortic valve replacement. Coronary artery calcifications and aortic atherosclerosis. ABDOMEN AND PELVIS CT 1. No acute findings within the abdomen or pelvis. 2. Colonic diverticulosis without evidence diverticulitis. Mild increase in the colonic stool burden. 3. Stable small midline paraumbilical hernia. 4. Aortic atherosclerosis. Electronically Signed   By: Lajean Manes M.D.   On: 12/18/2020 11:11   CT Abdomen Pelvis W Contrast  Result Date: 12/18/2020 CLINICAL DATA:  Right AND Left upper quadrant pain since Tuesday and nausea. Increase SOB. History of radiation therapy left lung carcinoma. EXAM: CT ANGIOGRAPHY CHEST CT ABDOMEN AND PELVIS WITH CONTRAST TECHNIQUE: Multidetector CT imaging of the chest was performed using the standard protocol during bolus administration of intravenous contrast. Multiplanar CT image reconstructions and MIPs were obtained to evaluate the vascular anatomy. Multidetector CT imaging of the abdomen and pelvis was performed using the standard protocol during bolus administration of intravenous contrast. CONTRAST:  15mL OMNIPAQUE IOHEXOL 350 MG/ML SOLN COMPARISON:  09/01/2020 FINDINGS: CTA CHEST FINDINGS Cardiovascular: Pulmonary arteries are well opacified. There is no evidence of a pulmonary embolism. Heart is mildly enlarged. Stable changes from previous aortic valve replacement. Three-vessel coronary artery calcifications. No pericardial effusion. Aorta is unopacified. Are aortic and arch branch vessel atherosclerotic calcifications. Mediastinum/Nodes: Normal thyroid. No neck base or axillary masses or enlarged lymph nodes. Mildly enlarged right paratracheal, azygos level lymph node measuring 1.4 cm in short axis. No mediastinal or hilar masses. No other enlarged lymph nodes.  Trachea and esophagus are unremarkable. Lungs/Pleura: Bilateral interstitial thickening. Consolidation associated with bronchiectasis extends from the hilum into the left upper lobe. Small areas of opacity are noted at the lung bases consistent with atelectasis. Small bilateral pleural effusions. No pneumothorax. Musculoskeletal: Old right rib fractures. No acute fracture. No bone lesion. No chest wall masses. Review of the MIP images confirms the above findings. CT ABDOMEN and PELVIS FINDINGS Hepatobiliary: Liver normal in size and attenuation. No masses. Gallbladder surgically absent. Common bile duct dilated to a maximum 1.8 cm with distal tapering, unchanged. No evidence of a duct stone. Pancreas: Unremarkable. No pancreatic ductal dilatation or surrounding inflammatory changes. Spleen: Normal in size without focal abnormality. Adrenals/Urinary Tract: 2.1 cm left adrenal mass,  unchanged from the previous CT scan and stable from a chest CT 12/27/2019. Normal right adrenal gland Kidneys normal in size, orientation and position. Symmetric renal enhancement and excretion. Two low-density left renal masses, largest, 1.9 cm, a lower pole renal sinus mass, both consistent with cysts and both stable. No other masses, no stones and no hydronephrosis. Normal ureters. Normal bladder. Stomach/Bowel: Normal stomach. Small bowel and colon are normal in caliber. No wall thickening. No inflammation. Numerous colonic diverticula most evident along the sigmoid. Mild generalized increase in the colonic stool burden. Vascular/Lymphatic: Dense aortic atherosclerosis. No aneurysm. No enlarged lymph nodes. Reproductive: Uterus and bilateral adnexa are unremarkable. Other: Small midline hernia contains a knuckle of the transverse colon, unchanged from the prior study. No ascites. Musculoskeletal: No fracture. No bone lesion. Previous L4 through S1 posterior lumbar spine fusion, stable from the prior exam. Review of the MIP images  confirms the above findings. IMPRESSION: CHEST CTA 1. No evidence of a pulmonary embolism. 2. Congestive heart failure diffuse bilateral interstitial thickening and small bilateral pleural effusions. 3. Left lung opacity extending superiorly from the left hilum consistent with post radiation scarring, stable from the prior CT. 4. Mild dependent atelectasis. 5. Changes from aortic valve replacement. Coronary artery calcifications and aortic atherosclerosis. ABDOMEN AND PELVIS CT 1. No acute findings within the abdomen or pelvis. 2. Colonic diverticulosis without evidence diverticulitis. Mild increase in the colonic stool burden. 3. Stable small midline paraumbilical hernia. 4. Aortic atherosclerosis. Electronically Signed   By: Lajean Manes M.D.   On: 12/18/2020 11:11   DG Chest Port 1 View  Result Date: 12/18/2020 CLINICAL DATA:  Chest pain. Possible nipple shadow at the right lung base on a portable chest earlier today. EXAM: PORTABLE CHEST 1 VIEW COMPARISON:  Earlier today. FINDINGS: Stable enlarged cardiac silhouette. Mild increase in prominence of the interstitial markings. Linear scarring in the left upper lobe is again noted. Stable small left pleural effusion. The nodular density seen at the right lung base previously is no longer visualized. The patient's nipples are more inferiorly located and not included on the image due to their low position. Thoracic and cervical spine degenerative changes. Moderate bilateral glenohumeral joint degenerative changes. IMPRESSION: 1. No nipple shadow or right lung nodule. 2. Mild interstitial pulmonary edema with progression. 3. Stable cardiomegaly and small left pleural effusion. Electronically Signed   By: Claudie Revering M.D.   On: 12/18/2020 09:00   DG Chest Port 1 View  Result Date: 12/18/2020 CLINICAL DATA:  Chest pain EXAM: PORTABLE CHEST 1 VIEW COMPARISON:  November 10, 2020.  September 25, 2020 FINDINGS: Persistent scarring noted in the left upper lobe. There is  diffuse interstitial thickening, stable. There is a minimal left pleural effusion. Opacity right base region potentially may represent overlying nipple shadow. Heart is mildly enlarged with pulmonary vascularity within normal limits. Patient is status post aortic valve replacement. There is aortic atherosclerosis. Bones are osteoporotic. There is degenerative change in the shoulders with a degree of superior migration of each humeral head. IMPRESSION: Scarring left upper lobe, stable. Interstitial thickening throughout the lungs likely represents a degree of chronic bronchitis. Small left pleural effusion. Question nipple shadow right base; repeat study with nipple markers advised to exclude underlying developing infiltrate in this area. Cardiac prominence, stable. Status post aortic valve replacement. Aortic Atherosclerosis (ICD10-I70.0). Electronically Signed   By: Lowella Grip III M.D.   On: 12/18/2020 08:36    EKG: Independently reviewed.  Chronic A. Fib, Chronic LBBB  Assessment/Plan  Active Problems:   Acute exacerbation of CHF (congestive heart failure) (HCC)   CHF (congestive heart failure) ([place])  (please populate well all problems here in Problem List. (For example, if patient is on BP meds at home and you resume or decide to hold them, it is a problem that needs to be her. Same for CAD, COPD, HLD and so on)  Acute hypoxic respiratory failure secondary to acute on chronic systolic CHF decompensation +/- uncontrolled A. Fib -Responded well with IV Lasix, however family reported patient did not tolerate long term Lasix well, when she developed "Jaundice" two time in her past while on long term Lasix. Plan to use short course Lasix, given patient showed very good response, then switched to as needed Lasix at home for weight gaining 2 pounds / 2 days and/or 5 pound / 1 week or relieving breathing symptoms or leg swelling.  Discussed with pharmacist -Check echocardiogram, if significant changes  in TAVR structure or LVEF will consult cardiology. -Increase metoprolol from 50 mg daily to 50 mg twice daily. -As needed labetalol for uncontrolled hypertension. -Hold ARB today for received IV contrast.  Chronic A. Fib -Became normal controlled briefly after CT scan today.  Increase metoprolol as above.  May need outpatient Holter monitoring. -Potassium p.o. 40 mg x 1, check magnesium and phosphorus level -Eliquis.  LLL CA status post radiation therapy -Status post radiation therapy without tissue diagnosis in June 2021. -Appears to be stable on today's CT scan, outpatient follow-up with pulmonologist and oncology.  TAVR -Echo  HTN uncontrolled -As above.  Chest pain -Likely from worsening of B/L pleural effusion -Lasix then reevaluate   DVT prophylaxis: Eliquis Code Status: Full code Family Communication: Son at bedside Disposition Plan: Expect 1 to 2 days hospital stay to wean off oxygen and IV diuresis Consults called: None Admission status: Tele admit   Lequita Halt MD [facility] Pager [phone]  12/18/2020, 5:22 PM

## 2020-12-18 NOTE — Progress Notes (Signed)
Patient became SOB and othopneic upon returning from CT.  Placed patient on Hi-Flo nasal cannula at 15 liters.  Patient SPO2 improved to 90% and remained orthopneic and SOB with a RR of 35.  Placed patient on 100% non re-breather. And Patient remained Othopneic and SOB but her SPO2 increased to 100%.  Attempted to place patient on NIV per MDs instruction but patient could not tolerate NIV at this time.  Placed patient back on 100% non rebreather and patient's SOB improved and her RR rate.  Patient's urine output has increased and has put out over 500 ccs and her symptoms continue to improve.  Placed patient back on a 10 liter HI-Flo nasal cannula.  Patient is able to speak in complete sentences, is no longer SOB, and her respiratory rate remains between 16 and 22.  Patient is feeling much better and breathing easier.  RT will continue to monitor.

## 2020-12-18 NOTE — ED Notes (Signed)
MD at bedside. 

## 2020-12-18 NOTE — Progress Notes (Signed)
Heart Failure Nurse Navigator Progress Note  Navigation team following this hospitalization. Pt currently on 15L HF [place]; plan to screen for HV TOC readiness as pt pulmonary status progresses. Will plan to interview tomorrow as respiratory and diuresis status improve allowing pt to interact more comfortably during interview process.  Pricilla Holm, RN, BSN Heart Failure Nurse Navigator [phone]

## 2020-12-18 NOTE — ED Notes (Signed)
Patient had a difficult time breathing following being laid flat for CT. Patient was pale and gasping. MD and Respiratory at bedside.

## 2020-12-18 NOTE — Progress Notes (Signed)
Patient's son, Louie Casa, called to the nurse station and said his mother was on the phone with him and communicating suicidal thoughts. Patient still denies being in [facility] and making attempts to remove equipment and get out of bed. MD notified.

## 2020-12-18 NOTE — ED Provider Notes (Signed)
[facility] EMERGENCY DEPT Provider Note   CSN: [id number] Arrival date & time: 12/18/20  3825     History Chief Complaint  Patient presents with  . Abdominal Pain  . Nausea    Tara Fields is a 87 y.o. female.  She is brought in by her son for evaluation of upper abdominal pain radiating to her chest associated with some shortness of breath.  This is been on and off for weeks.  She has a history of a TAVR for aortic stenosis, coronary disease, COPD, reflux and is on anticoagulation.  She was admitted in January for hypoxia and pneumonia and also had an ED visit in late March for a fall and rib pain.  No recent falls.  Son states since she had the fall she has been sleeping in a recliner and so has had increased lower extremity edema.  She has been on diuretics in the past but he says it makes her electrolytes abnormal.  She denies any fever.  There is been some nausea no vomiting.  Infrequent diarrhea.  No urinary symptoms  The history is provided by the patient and a relative.  Abdominal Pain Pain location:  Epigastric Pain radiates to:  Chest Pain severity:  Moderate Onset quality:  Gradual Timing:  Intermittent Progression:  Unchanged Chronicity:  New Relieved by:  None tried Worsened by:  Nothing Ineffective treatments:  None tried Associated symptoms: chest pain, cough, diarrhea, nausea and shortness of breath   Associated symptoms: no dysuria, no fever, no sore throat and no vomiting   Risk factors: being elderly        Past Medical History:  Diagnosis Date  . Aortic stenosis    s/p TAVR 26 mm Edwards Sapien 3 THV July 2020  . CAD (coronary artery disease)   . COPD (chronic obstructive pulmonary disease) ([place])   . Diabetes mellitus ([place])   . Diverticulitis   . GERD (gastroesophageal reflux disease)   . HTN (hypertension)   . Hyperlipidemia   . Legally blind in left eye, as defined in [place]   . Tobacco abuse     Patient Active Problem List    Diagnosis Date Noted  . COPD (chronic obstructive pulmonary disease) ([place]) 09/02/2020  . Dyslipidemia 09/02/2020  . Acute respiratory failure with hypoxia ([place]) 09/01/2020  . Pulmonary nodule 1 cm or greater in diameter 01/03/2020  . S/P TAVR (transcatheter aortic valve replacement) 05/03/2019  . Primary osteoarthritis involving multiple joints 05/03/2019  . Other persistent atrial fibrillation ([place]) 05/03/2019  . Bilateral carotid artery stenosis 12/01/2017  . 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/13/2014  . Aortic valve disorder 05/03/2014  . Chronic obstructive bronchitis without exacerbation ([place]) 05/03/2014  . Derangement of left knee 05/03/2014  . Esophageal reflux 05/03/2014  . Essential hypertension 05/03/2014  . Pure hypercholesterolemia 05/03/2014  . Type II diabetes mellitus ([place]) 05/03/2014  . Vitamin D deficiency 05/03/2014    Past Surgical History:  Procedure Laterality Date  . ABDOMINAL HYSTERECTOMY    . APPENDECTOMY    . BACK SURGERY    . CATARACT EXTRACTION, BILATERAL    . CHOLECYSTECTOMY       OB History    Gravida  2   Para  2   Term      Preterm      AB      Living        SAB      IAB      Ectopic      Multiple  Live Births              Family History  Problem Relation Age of Onset  . Diabetes Mother   . Stroke Brother   . Cerebral palsy Brother   . Coronary artery disease Neg Hx     Social History   Tobacco Use  . Smoking status: Former Smoker    Packs/day: 1.00    Years: 70.00    Pack years: 70.00    Types: Cigarettes    Quit date: 12/15/2019    Years since quitting: 1.0  . Smokeless tobacco: Never Used  . Tobacco comment: Pt states quiting a year ago. 01/03/20 [facility]  Substance Use Topics  . Alcohol use: No    Alcohol/week: 0.0 standard drinks  . Drug use: No    Home Medications Prior to Admission medications   Medication Sig Start Date End Date Taking? Authorizing Provider  amLODipine (NORVASC) 5 MG tablet TAKE  1 TABLET BY MOUTH EVERY DAY 10/22/20  Yes Nafziger, Tommi Rumps, NP  aspirin 81 MG chewable tablet Chew 1 tablet (81 mg total) by mouth daily. 04/01/20  Yes Nafziger, Tommi Rumps, NP  atorvastatin (LIPITOR) 40 MG tablet TAKE 1 TABLET BY MOUTH DAILY AT 6 PM. 10/09/20  Yes Nafziger, Tommi Rumps, NP  Cholecalciferol (VITAMIN D-3 PO) Take 5,000 Units by mouth 2 (two) times daily.   Yes [provider]  ELIQUIS 5 MG TABS tablet TAKE 1 TABLET BY MOUTH TWICE A DAY 10/22/20  Yes Nafziger, Tommi Rumps, NP  fenofibrate 160 MG tablet TAKE 1 TABLET BY MOUTH EVERY DAY 10/22/20  Yes Nafziger, Tommi Rumps, NP  ferrous gluconate (FERGON) 324 MG tablet Take 1 tablet (324 mg total) by mouth daily with breakfast. 10/09/20  Yes Nafziger, Tommi Rumps, NP  losartan (COZAAR) 100 MG tablet TAKE 1 TABLET BY MOUTH EVERY DAY 11/10/20  Yes Burnell Blanks, MD  metoprolol tartrate (LOPRESSOR) 50 MG tablet TAKE 1 TABLET BY MOUTH TWICE A DAY 10/22/20  Yes Nafziger, Tommi Rumps, NP  Multiple Vitamins-Minerals (WOMENS 50+ [place] VITAMIN/MIN PO) Take 1 tablet by mouth daily. CENTRUM SILVER   Yes [provider]  NASAL SPRAY SALINE NA Place into the nose.   Yes [provider]  terazosin (HYTRIN) 10 MG capsule Take 1 capsule (10 mg total) by mouth daily at 12 noon. 12/16/20  Yes Nafziger, Tommi Rumps, NP  traMADol (ULTRAM) 50 MG tablet TAKE 1 TABLET (50 MG TOTAL) BY MOUTH IN THE MORNING, AT NOON, AND AT BEDTIME. 10/03/20  Yes Nafziger, Tommi Rumps, NP  acetaminophen (TYLENOL) 325 MG tablet Take 650 mg by mouth every 6 (six) hours as needed (SEVERE PAIN).    [provider]  potassium chloride (KLOR-CON) 10 MEQ tablet Take 1 tablet (10 mEq total) by mouth daily. 09/25/20   Nafziger, Tommi Rumps, NP    Allergies    Amlodipine besy-benazepril hcl, Hctz [hydrochlorothiazide], Diclofenac sodium, Hydralazine, and Methylprednisolone  Review of Systems   Review of Systems  Constitutional: Negative for fever.  HENT: Negative for sore throat.   Eyes: Negative for pain.   Respiratory: Positive for cough and shortness of breath.   Cardiovascular: Positive for chest pain.  Gastrointestinal: Positive for abdominal pain, diarrhea and nausea. Negative for vomiting.  Genitourinary: Negative for dysuria.  Musculoskeletal: Positive for back pain (chronic).  Skin: Negative for rash.  Neurological: Negative for syncope.    Physical Exam Updated Vital Signs BP (!) 145/71 (BP Location: Right Arm)   Pulse (!) 101   Temp 98 F (36.7 C) (Oral)  Resp 16   Ht 5' (1.524 m)   Wt 71.7 kg   SpO2 96%   BMI 30.86 kg/m   Physical Exam Vitals and nursing note reviewed.  Constitutional:      General: She is not in acute distress.    Appearance: Normal appearance. She is well-developed.  HENT:     Head: Normocephalic and atraumatic.  Eyes:     Conjunctiva/sclera: Conjunctivae normal.  Cardiovascular:     Rate and Rhythm: Regular rhythm. Tachycardia present.     Heart sounds: No murmur heard.   Pulmonary:     Effort: Pulmonary effort is normal. No respiratory distress.     Breath sounds: Normal breath sounds.  Abdominal:     Palpations: Abdomen is soft.     Tenderness: There is no abdominal tenderness. There is no guarding or rebound.  Musculoskeletal:        General: No tenderness. Normal range of motion.     Cervical back: Neck supple.     Right lower leg: Edema present.     Left lower leg: Edema present.     Comments: She has trace edema bilateral lower extremities no cords appreciated  Skin:    General: Skin is warm and dry.  Neurological:     General: No focal deficit present.     Mental Status: She is alert. Mental status is at baseline.     ED Results / Procedures / Treatments   Labs (all labs ordered are listed, but only abnormal results are displayed) Labs Reviewed  COMPREHENSIVE METABOLIC PANEL - Abnormal; Notable for the following components:      Result Value   Glucose, Bld 185 (*)    All other components within normal limits   LIPASE, BLOOD - Abnormal; Notable for the following components:   Lipase <10 (*)    All other components within normal limits  BRAIN NATRIURETIC PEPTIDE - Abnormal; Notable for the following components:   B Natriuretic Peptide 1,444.7 (*)    All other components within normal limits  CBC WITH DIFFERENTIAL/PLATELET - Abnormal; Notable for the following components:   RDW 15.9 (*)    Lymphs Abs 0.4 (*)    All other components within normal limits  RESP PANEL BY RT-PCR (FLU A&B, COVID) [id number]  BASIC METABOLIC PANEL  MAGNESIUM  PHOSPHORUS  TROPONIN I (HIGH SENSITIVITY)  TROPONIN I (HIGH SENSITIVITY)    EKG EKG Interpretation  Date/Time:  Thursday Dec 18 2020 08:21:04 EDT Ventricular Rate:  87 PR Interval:    QRS Duration: 171 QT Interval:  420 QTC Calculation: 506 R Axis:   -77 Text Interpretation: Atrial fibrillation Left bundle branch block Baseline wander in lead(s) V2 No significant change since prior 1/22 Confirmed by Aletta Edouard [id number]) on 12/18/2020 8:23:46 AM   Radiology CT Angio Chest PE W/Cm &/Or Wo Cm  Result Date: 12/18/2020 CLINICAL DATA:  Right AND Left upper quadrant pain since Tuesday and nausea. Increase SOB. History of radiation therapy left lung carcinoma. EXAM: CT ANGIOGRAPHY CHEST CT ABDOMEN AND PELVIS WITH CONTRAST TECHNIQUE: Multidetector CT imaging of the chest was performed using the standard protocol during bolus administration of intravenous contrast. Multiplanar CT image reconstructions and MIPs were obtained to evaluate the vascular anatomy. Multidetector CT imaging of the abdomen and pelvis was performed using the standard protocol during bolus administration of intravenous contrast. CONTRAST:  45mL OMNIPAQUE IOHEXOL 350 MG/ML SOLN COMPARISON:  09/01/2020 FINDINGS: CTA CHEST FINDINGS Cardiovascular: Pulmonary arteries are well opacified. There is no evidence of  a pulmonary embolism. Heart is mildly enlarged. Stable changes from previous aortic valve  replacement. Three-vessel coronary artery calcifications. No pericardial effusion. Aorta is unopacified. Are aortic and arch branch vessel atherosclerotic calcifications. Mediastinum/Nodes: Normal thyroid. No neck base or axillary masses or enlarged lymph nodes. Mildly enlarged right paratracheal, azygos level lymph node measuring 1.4 cm in short axis. No mediastinal or hilar masses. No other enlarged lymph nodes. Trachea and esophagus are unremarkable. Lungs/Pleura: Bilateral interstitial thickening. Consolidation associated with bronchiectasis extends from the hilum into the left upper lobe. Small areas of opacity are noted at the lung bases consistent with atelectasis. Small bilateral pleural effusions. No pneumothorax. Musculoskeletal: Old right rib fractures. No acute fracture. No bone lesion. No chest wall masses. Review of the MIP images confirms the above findings. CT ABDOMEN and PELVIS FINDINGS Hepatobiliary: Liver normal in size and attenuation. No masses. Gallbladder surgically absent. Common bile duct dilated to a maximum 1.8 cm with distal tapering, unchanged. No evidence of a duct stone. Pancreas: Unremarkable. No pancreatic ductal dilatation or surrounding inflammatory changes. Spleen: Normal in size without focal abnormality. Adrenals/Urinary Tract: 2.1 cm left adrenal mass, unchanged from the previous CT scan and stable from a chest CT 12/27/2019. Normal right adrenal gland Kidneys normal in size, orientation and position. Symmetric renal enhancement and excretion. Two low-density left renal masses, largest, 1.9 cm, a lower pole renal sinus mass, both consistent with cysts and both stable. No other masses, no stones and no hydronephrosis. Normal ureters. Normal bladder. Stomach/Bowel: Normal stomach. Small bowel and colon are normal in caliber. No wall thickening. No inflammation. Numerous colonic diverticula most evident along the sigmoid. Mild generalized increase in the colonic stool burden.  Vascular/Lymphatic: Dense aortic atherosclerosis. No aneurysm. No enlarged lymph nodes. Reproductive: Uterus and bilateral adnexa are unremarkable. Other: Small midline hernia contains a knuckle of the transverse colon, unchanged from the prior study. No ascites. Musculoskeletal: No fracture. No bone lesion. Previous L4 through S1 posterior lumbar spine fusion, stable from the prior exam. Review of the MIP images confirms the above findings. IMPRESSION: CHEST CTA 1. No evidence of a pulmonary embolism. 2. Congestive heart failure diffuse bilateral interstitial thickening and small bilateral pleural effusions. 3. Left lung opacity extending superiorly from the left hilum consistent with post radiation scarring, stable from the prior CT. 4. Mild dependent atelectasis. 5. Changes from aortic valve replacement. Coronary artery calcifications and aortic atherosclerosis. ABDOMEN AND PELVIS CT 1. No acute findings within the abdomen or pelvis. 2. Colonic diverticulosis without evidence diverticulitis. Mild increase in the colonic stool burden. 3. Stable small midline paraumbilical hernia. 4. Aortic atherosclerosis. Electronically Signed   By: Lajean Manes M.D.   On: 12/18/2020 11:11   CT Abdomen Pelvis W Contrast  Result Date: 12/18/2020 CLINICAL DATA:  Right AND Left upper quadrant pain since Tuesday and nausea. Increase SOB. History of radiation therapy left lung carcinoma. EXAM: CT ANGIOGRAPHY CHEST CT ABDOMEN AND PELVIS WITH CONTRAST TECHNIQUE: Multidetector CT imaging of the chest was performed using the standard protocol during bolus administration of intravenous contrast. Multiplanar CT image reconstructions and MIPs were obtained to evaluate the vascular anatomy. Multidetector CT imaging of the abdomen and pelvis was performed using the standard protocol during bolus administration of intravenous contrast. CONTRAST:  52mL OMNIPAQUE IOHEXOL 350 MG/ML SOLN COMPARISON:  09/01/2020 FINDINGS: CTA CHEST FINDINGS  Cardiovascular: Pulmonary arteries are well opacified. There is no evidence of a pulmonary embolism. Heart is mildly enlarged. Stable changes from previous aortic valve replacement. Three-vessel coronary artery  calcifications. No pericardial effusion. Aorta is unopacified. Are aortic and arch branch vessel atherosclerotic calcifications. Mediastinum/Nodes: Normal thyroid. No neck base or axillary masses or enlarged lymph nodes. Mildly enlarged right paratracheal, azygos level lymph node measuring 1.4 cm in short axis. No mediastinal or hilar masses. No other enlarged lymph nodes. Trachea and esophagus are unremarkable. Lungs/Pleura: Bilateral interstitial thickening. Consolidation associated with bronchiectasis extends from the hilum into the left upper lobe. Small areas of opacity are noted at the lung bases consistent with atelectasis. Small bilateral pleural effusions. No pneumothorax. Musculoskeletal: Old right rib fractures. No acute fracture. No bone lesion. No chest wall masses. Review of the MIP images confirms the above findings. CT ABDOMEN and PELVIS FINDINGS Hepatobiliary: Liver normal in size and attenuation. No masses. Gallbladder surgically absent. Common bile duct dilated to a maximum 1.8 cm with distal tapering, unchanged. No evidence of a duct stone. Pancreas: Unremarkable. No pancreatic ductal dilatation or surrounding inflammatory changes. Spleen: Normal in size without focal abnormality. Adrenals/Urinary Tract: 2.1 cm left adrenal mass, unchanged from the previous CT scan and stable from a chest CT 12/27/2019. Normal right adrenal gland Kidneys normal in size, orientation and position. Symmetric renal enhancement and excretion. Two low-density left renal masses, largest, 1.9 cm, a lower pole renal sinus mass, both consistent with cysts and both stable. No other masses, no stones and no hydronephrosis. Normal ureters. Normal bladder. Stomach/Bowel: Normal stomach. Small bowel and colon are normal  in caliber. No wall thickening. No inflammation. Numerous colonic diverticula most evident along the sigmoid. Mild generalized increase in the colonic stool burden. Vascular/Lymphatic: Dense aortic atherosclerosis. No aneurysm. No enlarged lymph nodes. Reproductive: Uterus and bilateral adnexa are unremarkable. Other: Small midline hernia contains a knuckle of the transverse colon, unchanged from the prior study. No ascites. Musculoskeletal: No fracture. No bone lesion. Previous L4 through S1 posterior lumbar spine fusion, stable from the prior exam. Review of the MIP images confirms the above findings. IMPRESSION: CHEST CTA 1. No evidence of a pulmonary embolism. 2. Congestive heart failure diffuse bilateral interstitial thickening and small bilateral pleural effusions. 3. Left lung opacity extending superiorly from the left hilum consistent with post radiation scarring, stable from the prior CT. 4. Mild dependent atelectasis. 5. Changes from aortic valve replacement. Coronary artery calcifications and aortic atherosclerosis. ABDOMEN AND PELVIS CT 1. No acute findings within the abdomen or pelvis. 2. Colonic diverticulosis without evidence diverticulitis. Mild increase in the colonic stool burden. 3. Stable small midline paraumbilical hernia. 4. Aortic atherosclerosis. Electronically Signed   By: Lajean Manes M.D.   On: 12/18/2020 11:11   DG Chest Port 1 View  Result Date: 12/18/2020 CLINICAL DATA:  Chest pain. Possible nipple shadow at the right lung base on a portable chest earlier today. EXAM: PORTABLE CHEST 1 VIEW COMPARISON:  Earlier today. FINDINGS: Stable enlarged cardiac silhouette. Mild increase in prominence of the interstitial markings. Linear scarring in the left upper lobe is again noted. Stable small left pleural effusion. The nodular density seen at the right lung base previously is no longer visualized. The patient's nipples are more inferiorly located and not included on the image due to their  low position. Thoracic and cervical spine degenerative changes. Moderate bilateral glenohumeral joint degenerative changes. IMPRESSION: 1. No nipple shadow or right lung nodule. 2. Mild interstitial pulmonary edema with progression. 3. Stable cardiomegaly and small left pleural effusion. Electronically Signed   By: Claudie Revering M.D.   On: 12/18/2020 09:00   DG Chest [facility]  Result Date: 12/18/2020 CLINICAL DATA:  Chest pain EXAM: PORTABLE CHEST 1 VIEW COMPARISON:  November 10, 2020.  September 25, 2020 FINDINGS: Persistent scarring noted in the left upper lobe. There is diffuse interstitial thickening, stable. There is a minimal left pleural effusion. Opacity right base region potentially may represent overlying nipple shadow. Heart is mildly enlarged with pulmonary vascularity within normal limits. Patient is status post aortic valve replacement. There is aortic atherosclerosis. Bones are osteoporotic. There is degenerative change in the shoulders with a degree of superior migration of each humeral head. IMPRESSION: Scarring left upper lobe, stable. Interstitial thickening throughout the lungs likely represents a degree of chronic bronchitis. Small left pleural effusion. Question nipple shadow right base; repeat study with nipple markers advised to exclude underlying developing infiltrate in this area. Cardiac prominence, stable. Status post aortic valve replacement. Aortic Atherosclerosis (ICD10-I70.0). Electronically Signed   By: Lowella Grip III M.D.   On: 12/18/2020 08:36    Procedures .Critical Care Performed by: Hayden Rasmussen, MD Authorized by: Hayden Rasmussen, MD   Critical care provider statement:    Critical care time (minutes):  45   Critical care time was exclusive of:  Separately billable procedures and treating other patients   Critical care was necessary to treat or prevent imminent or life-threatening deterioration of the following conditions:  Respiratory failure    Critical care was time spent personally by me on the following activities:  Discussions with consultants, evaluation of patient's response to treatment, examination of patient, ordering and performing treatments and interventions, ordering and review of laboratory studies, ordering and review of radiographic studies, pulse oximetry, re-evaluation of patient's condition, obtaining history from patient or surrogate, review of old charts and development of treatment plan with patient or surrogate     Medications Ordered in ED Medications  aspirin chewable tablet 81 mg (has no administration in time range)  traMADol (ULTRAM) tablet 50 mg (has no administration in time range)  amLODipine (NORVASC) tablet 5 mg (has no administration in time range)  atorvastatin (LIPITOR) tablet 40 mg (has no administration in time range)  fenofibrate tablet 160 mg (has no administration in time range)  metoprolol tartrate (LOPRESSOR) tablet 50 mg (has no administration in time range)  terazosin (HYTRIN) capsule 10 mg (has no administration in time range)  apixaban (ELIQUIS) tablet 5 mg (has no administration in time range)  ferrous gluconate (FERGON) tablet 324 mg (has no administration in time range)  cholecalciferol (VITAMIN D3) tablet 5,000 Units (has no administration in time range)  potassium chloride (KLOR-CON) CR tablet 10 mEq (has no administration in time range)  potassium chloride SA (KLOR-CON) CR tablet 40 mEq (has no administration in time range)  sodium chloride flush (NS) 0.9 % injection 3 mL (has no administration in time range)  sodium chloride flush (NS) 0.9 % injection 3 mL (has no administration in time range)  0.9 %  sodium chloride infusion (has no administration in time range)  acetaminophen (TYLENOL) tablet 650 mg (has no administration in time range)  ondansetron (ZOFRAN) injection 4 mg (has no administration in time range)  furosemide (LASIX) injection 40 mg (has no administration in time  range)  labetalol (NORMODYNE) tablet 200 mg (has no administration in time range)  iohexol (OMNIPAQUE) 350 MG/ML injection 75 mL (75 mLs Intravenous Contrast Given 12/18/20 1019)  furosemide (LASIX) injection 40 mg (40 mg Intravenous Given 12/18/20 1049)    ED Course  I have reviewed the triage vital signs and the nursing  notes.  Pertinent labs & imaging results that were available during my care of the patient were reviewed by me and considered in my medical decision making (see chart for details).  Clinical Course as of 12/18/20 1731  Thu Dec 18, 2020  0835 Chest x-ray interpreted by me as scar left upper lobe no gross failure or infiltrates.  Interpreted by me, waiting on radiology reading. [MB]  1040 Patient had acute change in condition on return from CT.  She is now gasping for breath saturations around 92% on room air tachycardic into the 140s.  Respiratory present and is getting set her up with some high flow.  Has some rales in her bases.  No rash. [MB]  31 Discussed with [facility] hospitalist Dr. Tamala Julian who will put the patient in for a bed at [facility]. [MB]    Clinical Course User Index [MB] Hayden Rasmussen, MD   MDM Rules/Calculators/A&P                         This patient complains of upper abdominal and lower chest pain, shortness of breath this involves an extensive number of treatment Options and is a complaint that carries with it a high risk of complications and Morbidity. The differential includes ACS, pneumonia, pneumothorax, PE, hiatal hernia, gastritis, reflux, aspiration, CHF  I ordered, reviewed and interpreted labs, which included CBC with normal white count normal hemoglobin, chemistries normal other than elevated glucose, LFTs normal, COVID and flu testing negative, BNP markedly elevated at 1447, troponins flat I ordered medication IV Lasix and oxygen I ordered imaging studies which included chest x-ray and CT angio chest CT abdomen and pelvis and I independently     visualized and interpreted imaging which showed no evidence of PE.  Does have evidence of congestive heart failure Additional history obtained from patient's son Previous records obtained and reviewed in epic including prior ED visits I consulted [facility] hospitalist Dr. Tamala Julian and discussed lab and imaging findings  Critical Interventions: Work-up and management of patient's acute respiratory failure after return from CT with IV diuresis and high flow oxygen.  Patient did not tolerate BiPAP.  After the interventions stated above, I reevaluated the patient and found patient to be significantly improved.  She will need to be [facility] for further management.   Final Clinical Impression(s) / ED Diagnoses Final diagnoses:  CHF (congestive heart failure) ([place])    Rx / DC Orders ED Discharge Orders    None       Hayden Rasmussen, MD 12/18/20 1736

## 2020-12-18 NOTE — Plan of Care (Addendum)
Transfer from [place]. Tara Fields is a 86 year old female with pmh as  S/p TAVR, CHF, A. fib on Eliquis, and COPD who presented with 2 days of shortness of breath.  She was seen to go into A. fib with RVR with heart rates into the 150s after going to the CT scanner, but did not tolerate BiPAP and O2 saturation  are currently maintained on nasal cannula oxygen.  BNP 1444.7, troponins negative x2.  CTA of the chest, abd, pelvis significant for congestive heart failure with bilateral interstitial thickening and small bilateral pleural effusions with a left lung opacity extending superiorly and left hilum consistent with postradiation scarring.  Patient has been given 40 mg of Lasix IV.

## 2020-12-18 NOTE — ED Notes (Signed)
Patient placed on Pur wick.  Patient tolerated well.

## 2020-12-18 NOTE — ED Triage Notes (Signed)
Right & Left upper quadrant pain since Tuesday and nausea.

## 2020-12-19 ENCOUNTER — Encounter (HOSPITAL_COMMUNITY): Payer: Self-pay | Admitting: Internal Medicine

## 2020-12-19 ENCOUNTER — Other Ambulatory Visit (HOSPITAL_COMMUNITY): Payer: Medicare Other

## 2020-12-19 ENCOUNTER — Inpatient Hospital Stay (HOSPITAL_COMMUNITY): Payer: Medicare Other

## 2020-12-19 DIAGNOSIS — L8990 Pressure ulcer of unspecified site, unspecified stage: Secondary | ICD-10-CM | POA: Insufficient documentation

## 2020-12-19 LAB — BASIC METABOLIC PANEL
Anion gap: 9 (ref 5–15)
BUN: 12 mg/dL (ref 8–23)
CO2: 25 mmol/L (ref 22–32)
Calcium: 8.9 mg/dL (ref 8.9–10.3)
Chloride: 105 mmol/L (ref 98–111)
Creatinine, Ser: 0.75 mg/dL (ref 0.44–1.00)
GFR, Estimated: >60 mL/min (ref >60)
Glucose, Bld: 125 mg/dL — ABNORMAL HIGH (ref 70–99)
Potassium: 3.6 mmol/L (ref 3.5–5.1)
Sodium: 139 mmol/L (ref 135–145)

## 2020-12-19 MED ORDER — BIOTENE DRY MOUTH MT LIQD: 15.0000 mL | OROMUCOSAL | Status: DC | PRN

## 2020-12-19 NOTE — Progress Notes (Signed)
Pt's family member has came to visit pt, bringing pt's personal 4-wheel walker. Will continue to monitor pt.

## 2020-12-19 NOTE — Progress Notes (Signed)
Patient now has a Actuary at bedside.

## 2020-12-19 NOTE — Evaluation (Signed)
Physical Therapy Evaluation Patient Details Name: Tara Fields MRN: [id number] DOB: 1928-02-07 Today's Date: 12/19/2020   History of Present Illness  87 y.o. female presents to [facility] ED on 12/18/2020 with increasing SOB, LE swelling and chest pain. Pt admitted for acute respiratory failure 2/2 chronic systolic CHF decompensation, as well as uncontrolled Afib. PMH includes CHF, HTN, aortic stenosis s/p TAVR, PAF, blindness of L eye, lung CA.  Clinical Impression  Pt demonstrates ability to perform bed mobility, transfers, and gait without needing physical assistance. Pt demonstrates good knowledge of hand placement during sit>stand transfers and reports using a rollator at baseline. Pt demonstrates ability to ambulate limited community distances with use of RW on 3 L and oxygen sat levels remaining at 95%. Pt is limited in gait distance by activity tolerance at this time. Pt placed on 3 L [place] by end of session. Oxygen needs with mobility not assessed on less than 3 L oxygen.  Pt will benefit from acute PT to address deficits in overall strength, endurance, power, and activity tolerance for safe and independent mobility. SPT recommends HHPT for strengthening and implementation of walking program, however pt reports having a bad experience in the past.     Follow Up Recommendations Home health PT (Pt may refuse, reports bad experience with physical therapist in the past.)    Equipment Recommendations  None recommended by PT    Recommendations for Other Services       Precautions / Restrictions Precautions Precautions: Fall Restrictions Weight Bearing Restrictions: No      Mobility  Bed Mobility Overal bed mobility: Needs Assistance Bed Mobility: Supine to Sit     Supine to sit: Min guard;HOB elevated          Transfers Overall transfer level: Needs assistance Equipment used: Rolling walker (2 wheeled) Transfers: Sit to/from Stand Sit to Stand: Min guard         General  transfer comment: Pt demonstrates good knowledge for hand placement during sit>stand transfers. Min G for safety.  Ambulation/Gait Ambulation/Gait assistance: Min guard Gait Distance (Feet): 250 Feet Assistive device: Rolling walker (2 wheeled) Gait Pattern/deviations: Step-through pattern;Decreased stride length Gait velocity: decreased Gait velocity interpretation: 1.31 - 2.62 ft/sec, indicative of limited community ambulator General Gait Details: Pt weaned to 3 L [place] during gait, maintaining oxygen sats at 95%.  Stairs            Wheelchair Mobility    Modified Rankin (Stroke Patients Only)       Balance Overall balance assessment: Needs assistance Sitting-balance support: No upper extremity supported;Feet supported Sitting balance-Leahy Scale: Good Sitting balance - Comments: Pt able to maintain balance while donning socks with no UE support needed.   Standing balance support: Single extremity supported;Bilateral upper extremity supported;During functional activity Standing balance-Leahy Scale: Poor Standing balance comment: Pt requires at least single UE support to don face mask with static standing.                             Pertinent Vitals/Pain Pain Assessment: Faces Faces Pain Scale: Hurts little more Pain Location: L-sided upper abdominal pain Pain Descriptors / Indicators: Discomfort;Grimacing Pain Intervention(s): Monitored during session    Home Living Family/patient expects to be discharged to:: Assisted living               Home Equipment: Walker - 4 wheels;Cane - single point;Wheelchair - manual      Prior Function Level of Independence: Independent  with assistive device(s) (rollator)         Comments: Pt utilizes rollator to perform ADLs, including laundry.     Hand Dominance        Extremity/Trunk Assessment   Upper Extremity Assessment Upper Extremity Assessment: Generalized weakness    Lower Extremity  Assessment Lower Extremity Assessment: Generalized weakness       Communication   Communication: No difficulties  Cognition Arousal/Alertness: Awake/alert Behavior During Therapy: WFL for tasks assessed/performed Overall Cognitive Status: Within Functional Limits for tasks assessed                                        General Comments General comments (skin integrity, edema, etc.): Pt on 6 L  upon PT arrival. Pt reports not requiring oxygen at baseline.    Exercises     Assessment/Plan    PT Assessment Patient needs continued PT services  PT Problem List Decreased strength;Decreased activity tolerance;Decreased mobility;Decreased knowledge of use of DME;Decreased safety awareness;Pain       PT Treatment Interventions DME instruction;Gait training;Functional mobility training;Therapeutic activities;Therapeutic exercise;Patient/family education    PT Goals (Current goals can be found in the Care Plan section)  Acute Rehab PT Goals Patient Stated Goal: To return home PT Goal Formulation: With patient Time For Goal Achievement: 01/02/21 Potential to Achieve Goals: Good    Frequency Min 3X/week   Barriers to discharge        Co-evaluation               AM-PAC PT "6 Clicks" Mobility  Outcome Measure Help needed turning from your back to your side while in a flat bed without using bedrails?: A Little Help needed moving from lying on your back to sitting on the side of a flat bed without using bedrails?: A Little Help needed moving to and from a bed to a chair (including a wheelchair)?: A Little Help needed standing up from a chair using your arms (e.g., wheelchair or bedside chair)?: A Little Help needed to walk in hospital room?: A Little Help needed climbing 3-5 steps with a railing? : A Lot 6 Click Score: 17    End of Session Equipment Utilized During Treatment: Gait belt;Oxygen Activity Tolerance: Patient limited by fatigue Patient left:  in chair;with nursing/sitter in room;with call bell/phone within reach Nurse Communication: Mobility status PT Visit Diagnosis: Other abnormalities of gait and mobility (R26.89);Muscle weakness (generalized) (M62.81)    Time: 7371-0626 PT Time Calculation (min) (ACUTE ONLY): 40 min   Charges:   PT Evaluation $PT Eval Low Complexity: 1 Low PT Treatments $Therapeutic Activity: 8-22 mins        Acute Rehab  Pager: [phone]   Garwin Brothers, SPT 12/19/2020, 1:03 PM

## 2020-12-19 NOTE — Progress Notes (Signed)
Heart Failure Nurse Navigator Progress Note  PCP: [name], NP PCP-Cardiologist: Lauretta Grill., MD Admission Diagnosis: A/C CHF Admitted from: [facility] senior independent living.  Initiated [employer] wheel. Interviewed pt. SI sitter at bedside, son Louie Casa) at bedside. Pt more concerned with interacting with PT and social anxiety at her living facility.  Son had questions regarding level of care once return to living facility regarding independent vs assisted needs. RNCM informed. PT has already evaluated pt today. Pt stating purewick leaks, bedside staff made aware. Pt aox4; stated she is "too chicken" to hurt herself when questions about suicidal ideations.   ECHO/ LVEF: pending this admission.  Clinical Course:  Past Medical History:  Diagnosis Date  . Aortic stenosis    s/p TAVR 26 mm [place] 3 THV July 2020  . CAD (coronary artery disease)   . COPD (chronic obstructive pulmonary disease) ([place])   . Diabetes mellitus ([place])   . Diverticulitis   . GERD (gastroesophageal reflux disease)   . HTN (hypertension)   . Hyperlipidemia   . Legally blind in left eye, as defined in [place]   . Tobacco abuse      Social History   Socioeconomic History  . Marital status: Widowed    Spouse name: Not on file  . Number of children: 2  . Years of education: Not on file  . Highest education level: High school graduate  Occupational History  . Occupation: Retired    Comment: homemaker  Tobacco Use  . Smoking status: Former Smoker    Packs/day: 1.00    Years: 70.00    Pack years: 70.00    Types: Cigarettes    Quit date: 12/15/2019    Years since quitting: 1.0  . Smokeless tobacco: Never Used  . Tobacco comment: Pt states quiting a year ago. 01/03/20 [facility]  Vaping Use  . Vaping Use: Never used  Substance and Sexual Activity  . Alcohol use: No    Alcohol/week: 0.0 standard drinks  . Drug use: No  . Sexual activity: Not Currently  Other Topics Concern  . Not on file  Social  History Narrative   Widowed- was married for 48 years    Has two sons       Social Determinants of Health   Financial Resource Strain: Low Risk   . Difficulty of Paying Living Expenses: Not hard at all  Food Insecurity: No Food Insecurity  . Worried About Charity fundraiser in the Last Year: Never true  . Ran Out of Food in the Last Year: Never true  Transportation Needs: No Transportation Needs  . Lack of Transportation (Medical): No  . Lack of Transportation (Non-Medical): No  Physical Activity: Not on file  Stress: Not on file  Social Connections: Not on file   No need for HV TOC at this time.   Pricilla Holm, RN, BSN Heart Failure Nurse Navigator [phone]

## 2020-12-19 NOTE — Progress Notes (Addendum)
PROGRESS NOTE    Tara Fields  GGY:[id number] DOB: February 20, 1928 DOA: 12/18/2020 PCP: [name], NP   Brief Narrative: Taken from H&P. Tara Fields is a 87 y.o. female with medical history significant of chronic systolic CHF (LVEF 45 to 03% in 2020 at [facility]), HTN, aortic stenosis s/p TAVR 2018, paroxysmal A. fib on Eliquis, presented with increasing shortness of breath, chest pain and leg swelling.  Symptoms started 2 weeks ago, currently getting worse, with exertion no dyspnea.  Chest pain is on lower chest, aching-like, cannot take deep breath, worsening when lying down and partly relieved when sitting up or standing up.  Also developed orthopnea since last week.  Started to see leg swelling in the last 2 weeks.  Patient was hypoxic on arrival, CTA negative for PE but positive for bilateral pulmonary congestion and pleural effusions.  Patient was also found to have A. fib with RVR which improved on its own. Respiratory status improving with IV diuresis.  Subjective: Patient continues to feel little winded.  Denies any more chest pain.  Assessment & Plan:   Active Problems:   Acute exacerbation of CHF (congestive heart failure) (HCC)   CHF (congestive heart failure) (HCC)   Pressure injury of skin  Acute hypoxic respiratory failure secondary to acute on chronic HFrEF and A. fib with RVR. Patient does not use oxygen at baseline.  This morning she was saturating well on 5 L of oxygen, able to wean her to 2 L and saturation remains in high 90s. -Try weaning her off from oxygen. -Follow-up echocardiogram-still pending, might need cardiology consult if significant changes in TAVR structure or EF. -Continue with metoprolol. -Continue with Lasix -Strict intake and output -Daily weight and BNP  Chronic atrial fibrillation.  Rate controlled with increase in metoprolol. -Continue metoprolol and Eliquis.  Left lower lobe lung cancer s/p radiation.  Lung scarring secondary  to radiation seems stable on CT scan. -Continue outpatient pulmonology and oncology follow-up  TAVR.  Echocardiogram ordered to reevaluate her aortic valve, still pending.  It can be contributory to her shortness of breath. -Consult cardiology if significant changes in TAVR  Hypertension.  Blood pressure within goal. -Continue amlodipine, losartan and Lasix  Chest pain resolved.  Troponin remain negative.  Objective: Vitals:   12/19/20 1117 12/19/20 1126 12/19/20 1136 12/19/20 1519  BP: (!) 147/80   140/73  Pulse: 95   80  Resp: 18   (!) 22  Temp: 98.5 F (36.9 C)   98.2 F (36.8 C)  TempSrc: Oral   Oral  SpO2: 98% 99% 96% 92%  Weight:      Height:        Intake/Output Summary (Last 24 hours) at 12/19/2020 1552 Last data filed at 12/19/2020 1501 Gross per 24 hour  Intake 420 ml  Output 200 ml  Net 220 ml   Filed Weights   12/18/20 0801 12/19/20 0514  Weight: 71.7 kg 68.8 kg    Examination:  General exam: Frail elderly lady, appears calm and comfortable  Respiratory system: Clear to auscultation. Respiratory effort normal. Cardiovascular system: Irregularly irregular Gastrointestinal system: Soft, nontender, nondistended, bowel sounds positive. Central nervous system: Alert and oriented. No focal neurological deficits. Extremities: No edema, no cyanosis, pulses intact and symmetrical. Psychiatry: Judgement and insight appear normal. Mood & affect appropriate.    DVT prophylaxis: Eliquis Code Status: Full Family Communication: Son was updated on phone. Disposition Plan:  Status is: Inpatient  Remains inpatient appropriate because:Inpatient level of care appropriate due to  severity of illness   Dispo: The patient is from: Home              Anticipated d/c is to: Home              Patient currently is not medically stable to d/c.   Difficult to place patient No               Level of care: Telemetry Medical  All the records are reviewed and case discussed with  Care Management/Social Worker. Management plans discussed with the patient, nursing and they are in agreement.  Consultants:   None  Procedures:  Antimicrobials:   Data Reviewed: I have personally reviewed following labs and imaging studies  CBC: Recent Labs  Lab 12/18/20 0825  WBC 6.4  NEUTROABS 5.5  HGB 12.2  HCT 38.8  MCV 91.5  PLT 220   Basic Metabolic Panel: Recent Labs  Lab 12/18/20 0825 12/18/20 1846 12/19/20 0409  NA 139  --  139  K 3.6  --  3.6  CL 105  --  105  CO2 24  --  25  GLUCOSE 185*  --  125*  BUN 16  --  12  CREATININE 0.75  --  0.75  CALCIUM 9.1  --  8.9  MG  --  1.8  --   PHOS  --  3.5  --    GFR: Estimated Creatinine Clearance: 40.4 mL/min (by C-G formula based on SCr of 0.75 mg/dL). Liver Function Tests: Recent Labs  Lab 12/18/20 0825  AST 16  ALT 13  ALKPHOS 41  BILITOT 0.9  PROT 6.5  ALBUMIN 3.8   Recent Labs  Lab 12/18/20 0825  LIPASE <10*   No results for input(s): AMMONIA in the last 168 hours. Coagulation Profile: No results for input(s): INR, PROTIME in the last 168 hours. Cardiac Enzymes: No results for input(s): CKTOTAL, CKMB, CKMBINDEX, TROPONINI in the last 168 hours. BNP (last 3 results) No results for input(s): PROBNP in the last 8760 hours. HbA1C: No results for input(s): HGBA1C in the last 72 hours. CBG: No results for input(s): GLUCAP in the last 168 hours. Lipid Profile: No results for input(s): CHOL, HDL, LDLCALC, TRIG, CHOLHDL, LDLDIRECT in the last 72 hours. Thyroid Function Tests: No results for input(s): TSH, T4TOTAL, FREET4, T3FREE, THYROIDAB in the last 72 hours. Anemia Panel: No results for input(s): VITAMINB12, FOLATE, FERRITIN, TIBC, IRON, RETICCTPCT in the last 72 hours. Sepsis Labs: No results for input(s): PROCALCITON, LATICACIDVEN in the last 168 hours.  Recent Results (from the past 240 hour(s))  Resp Panel by RT-PCR (Flu A&B, Covid) Nasopharyngeal Swab     Status: None   Collection  Time: 12/18/20  8:25 AM   Specimen: Nasopharyngeal Swab; Nasopharyngeal(NP) swabs in vial transport medium  Result Value Ref Range Status   SARS Coronavirus 2 by RT PCR NEGATIVE NEGATIVE Final    Comment: (NOTE) SARS-CoV-2 target nucleic acids are NOT DETECTED.  The SARS-CoV-2 RNA is generally detectable in upper respiratory specimens during the acute phase of infection. The lowest concentration of SARS-CoV-2 viral copies this assay can detect is 138 copies/mL. A negative result does not preclude SARS-Cov-2 infection and should not be used as the sole basis for treatment or other patient management decisions. A negative result may occur with  improper specimen collection/handling, submission of specimen other than nasopharyngeal swab, presence of viral mutation(s) within the areas targeted by this assay, and inadequate number of viral copies(<138 copies/mL). A negative result  must be combined with clinical observations, patient history, and epidemiological information. The expected result is Negative.  Fact Sheet for Patients:  EntrepreneurPulse.com.au  Fact Sheet for Healthcare Providers:  IncredibleEmployment.be  This test is no t yet approved or cleared by the [place] FDA and  has been authorized for detection and/or diagnosis of SARS-CoV-2 by FDA under an Emergency Use Authorization (EUA). This EUA will remain  in effect (meaning this test can be used) for the duration of the COVID-19 declaration under Section 564(b)(1) of the Act, 21 U.S.C.section 360bbb-3(b)(1), unless the authorization is terminated  or revoked sooner.       Influenza A by PCR NEGATIVE NEGATIVE Final   Influenza B by PCR NEGATIVE NEGATIVE Final    Comment: (NOTE) The Xpert Xpress SARS-CoV-2/FLU/RSV plus assay is intended as an aid in the diagnosis of influenza from Nasopharyngeal swab specimens and should not be used as a sole basis for treatment. Nasal washings  and aspirates are unacceptable for Xpert Xpress SARS-CoV-2/FLU/RSV testing.  Fact Sheet for Patients: EntrepreneurPulse.com.au  Fact Sheet for Healthcare Providers: IncredibleEmployment.be  This test is not yet approved or cleared by the [place] FDA and has been authorized for detection and/or diagnosis of SARS-CoV-2 by FDA under an Emergency Use Authorization (EUA). This EUA will remain in effect (meaning this test can be used) for the duration of the COVID-19 declaration under Section 564(b)(1) of the Act, 21 U.S.C. section 360bbb-3(b)(1), unless the authorization is terminated or revoked.  Performed at [facility], [address] [place], [place] [address]      Radiology Studies: CT Angio Chest PE W/Cm &/Or Wo Cm  Result Date: 12/18/2020 CLINICAL DATA:  Right AND Left upper quadrant pain since Tuesday and nausea. Increase SOB. History of radiation therapy left lung carcinoma. EXAM: CT ANGIOGRAPHY CHEST CT ABDOMEN AND PELVIS WITH CONTRAST TECHNIQUE: Multidetector CT imaging of the chest was performed using the standard protocol during bolus administration of intravenous contrast. Multiplanar CT image reconstructions and MIPs were obtained to evaluate the vascular anatomy. Multidetector CT imaging of the abdomen and pelvis was performed using the standard protocol during bolus administration of intravenous contrast. CONTRAST:  50mL OMNIPAQUE IOHEXOL 350 MG/ML SOLN COMPARISON:  09/01/2020 FINDINGS: CTA CHEST FINDINGS Cardiovascular: Pulmonary arteries are well opacified. There is no evidence of a pulmonary embolism. Heart is mildly enlarged. Stable changes from previous aortic valve replacement. Three-vessel coronary artery calcifications. No pericardial effusion. Aorta is unopacified. Are aortic and arch branch vessel atherosclerotic calcifications. Mediastinum/Nodes: Normal thyroid. No neck base or axillary masses or enlarged  lymph nodes. Mildly enlarged right paratracheal, azygos level lymph node measuring 1.4 cm in short axis. No mediastinal or hilar masses. No other enlarged lymph nodes. Trachea and esophagus are unremarkable. Lungs/Pleura: Bilateral interstitial thickening. Consolidation associated with bronchiectasis extends from the hilum into the left upper lobe. Small areas of opacity are noted at the lung bases consistent with atelectasis. Small bilateral pleural effusions. No pneumothorax. Musculoskeletal: Old right rib fractures. No acute fracture. No bone lesion. No chest wall masses. Review of the MIP images confirms the above findings. CT ABDOMEN and PELVIS FINDINGS Hepatobiliary: Liver normal in size and attenuation. No masses. Gallbladder surgically absent. Common bile duct dilated to a maximum 1.8 cm with distal tapering, unchanged. No evidence of a duct stone. Pancreas: Unremarkable. No pancreatic ductal dilatation or surrounding inflammatory changes. Spleen: Normal in size without focal abnormality. Adrenals/Urinary Tract: 2.1 cm left adrenal mass, unchanged from the previous CT scan and stable from  a chest CT 12/27/2019. Normal right adrenal gland Kidneys normal in size, orientation and position. Symmetric renal enhancement and excretion. Two low-density left renal masses, largest, 1.9 cm, a lower pole renal sinus mass, both consistent with cysts and both stable. No other masses, no stones and no hydronephrosis. Normal ureters. Normal bladder. Stomach/Bowel: Normal stomach. Small bowel and colon are normal in caliber. No wall thickening. No inflammation. Numerous colonic diverticula most evident along the sigmoid. Mild generalized increase in the colonic stool burden. Vascular/Lymphatic: Dense aortic atherosclerosis. No aneurysm. No enlarged lymph nodes. Reproductive: Uterus and bilateral adnexa are unremarkable. Other: Small midline hernia contains a knuckle of the transverse colon, unchanged from the prior study. No  ascites. Musculoskeletal: No fracture. No bone lesion. Previous L4 through S1 posterior lumbar spine fusion, stable from the prior exam. Review of the MIP images confirms the above findings. IMPRESSION: CHEST CTA 1. No evidence of a pulmonary embolism. 2. Congestive heart failure diffuse bilateral interstitial thickening and small bilateral pleural effusions. 3. Left lung opacity extending superiorly from the left hilum consistent with post radiation scarring, stable from the prior CT. 4. Mild dependent atelectasis. 5. Changes from aortic valve replacement. Coronary artery calcifications and aortic atherosclerosis. ABDOMEN AND PELVIS CT 1. No acute findings within the abdomen or pelvis. 2. Colonic diverticulosis without evidence diverticulitis. Mild increase in the colonic stool burden. 3. Stable small midline paraumbilical hernia. 4. Aortic atherosclerosis. Electronically Signed   By: Lajean Manes M.D.   On: 12/18/2020 11:11   CT Abdomen Pelvis W Contrast  Result Date: 12/18/2020 CLINICAL DATA:  Right AND Left upper quadrant pain since Tuesday and nausea. Increase SOB. History of radiation therapy left lung carcinoma. EXAM: CT ANGIOGRAPHY CHEST CT ABDOMEN AND PELVIS WITH CONTRAST TECHNIQUE: Multidetector CT imaging of the chest was performed using the standard protocol during bolus administration of intravenous contrast. Multiplanar CT image reconstructions and MIPs were obtained to evaluate the vascular anatomy. Multidetector CT imaging of the abdomen and pelvis was performed using the standard protocol during bolus administration of intravenous contrast. CONTRAST:  57mL OMNIPAQUE IOHEXOL 350 MG/ML SOLN COMPARISON:  09/01/2020 FINDINGS: CTA CHEST FINDINGS Cardiovascular: Pulmonary arteries are well opacified. There is no evidence of a pulmonary embolism. Heart is mildly enlarged. Stable changes from previous aortic valve replacement. Three-vessel coronary artery calcifications. No pericardial effusion. Aorta  is unopacified. Are aortic and arch branch vessel atherosclerotic calcifications. Mediastinum/Nodes: Normal thyroid. No neck base or axillary masses or enlarged lymph nodes. Mildly enlarged right paratracheal, azygos level lymph node measuring 1.4 cm in short axis. No mediastinal or hilar masses. No other enlarged lymph nodes. Trachea and esophagus are unremarkable. Lungs/Pleura: Bilateral interstitial thickening. Consolidation associated with bronchiectasis extends from the hilum into the left upper lobe. Small areas of opacity are noted at the lung bases consistent with atelectasis. Small bilateral pleural effusions. No pneumothorax. Musculoskeletal: Old right rib fractures. No acute fracture. No bone lesion. No chest wall masses. Review of the MIP images confirms the above findings. CT ABDOMEN and PELVIS FINDINGS Hepatobiliary: Liver normal in size and attenuation. No masses. Gallbladder surgically absent. Common bile duct dilated to a maximum 1.8 cm with distal tapering, unchanged. No evidence of a duct stone. Pancreas: Unremarkable. No pancreatic ductal dilatation or surrounding inflammatory changes. Spleen: Normal in size without focal abnormality. Adrenals/Urinary Tract: 2.1 cm left adrenal mass, unchanged from the previous CT scan and stable from a chest CT 12/27/2019. Normal right adrenal gland Kidneys normal in size, orientation and position. Symmetric renal enhancement  and excretion. Two low-density left renal masses, largest, 1.9 cm, a lower pole renal sinus mass, both consistent with cysts and both stable. No other masses, no stones and no hydronephrosis. Normal ureters. Normal bladder. Stomach/Bowel: Normal stomach. Small bowel and colon are normal in caliber. No wall thickening. No inflammation. Numerous colonic diverticula most evident along the sigmoid. Mild generalized increase in the colonic stool burden. Vascular/Lymphatic: Dense aortic atherosclerosis. No aneurysm. No enlarged lymph nodes.  Reproductive: Uterus and bilateral adnexa are unremarkable. Other: Small midline hernia contains a knuckle of the transverse colon, unchanged from the prior study. No ascites. Musculoskeletal: No fracture. No bone lesion. Previous L4 through S1 posterior lumbar spine fusion, stable from the prior exam. Review of the MIP images confirms the above findings. IMPRESSION: CHEST CTA 1. No evidence of a pulmonary embolism. 2. Congestive heart failure diffuse bilateral interstitial thickening and small bilateral pleural effusions. 3. Left lung opacity extending superiorly from the left hilum consistent with post radiation scarring, stable from the prior CT. 4. Mild dependent atelectasis. 5. Changes from aortic valve replacement. Coronary artery calcifications and aortic atherosclerosis. ABDOMEN AND PELVIS CT 1. No acute findings within the abdomen or pelvis. 2. Colonic diverticulosis without evidence diverticulitis. Mild increase in the colonic stool burden. 3. Stable small midline paraumbilical hernia. 4. Aortic atherosclerosis. Electronically Signed   By: Lajean Manes M.D.   On: 12/18/2020 11:11   DG Chest Port 1 View  Result Date: 12/19/2020 CLINICAL DATA:  Chest pain EXAM: PORTABLE CHEST 1 VIEW COMPARISON:  Dec 18, 2020 chest radiograph and CT chest FINDINGS: There is persistent airspace opacity in the left upper lobe. There is a left pleural effusion. There is interstitial prominence bilaterally, likely due to a degree of interstitial pulmonary edema. There is cardiomegaly with pulmonary vascularity normal. Patient is status post aortic valve replacement. There is aortic atherosclerosis. No adenopathy. There is superior migration of the left humeral head. IMPRESSION: Persistent left upper lobe airspace opacity, likely pneumonia. Left pleural effusion. Right pleural effusions seen on recent CT not appreciable by radiography. Suspect mild interstitial pulmonary edema.  Stable cardiomegaly. Status post aortic valve  replacement. Aortic Atherosclerosis (ICD10-I70.0). Electronically Signed   By: Lowella Grip III M.D.   On: 12/19/2020 08:47   DG Chest Port 1 View  Result Date: 12/18/2020 CLINICAL DATA:  Chest pain. Possible nipple shadow at the right lung base on a portable chest earlier today. EXAM: PORTABLE CHEST 1 VIEW COMPARISON:  Earlier today. FINDINGS: Stable enlarged cardiac silhouette. Mild increase in prominence of the interstitial markings. Linear scarring in the left upper lobe is again noted. Stable small left pleural effusion. The nodular density seen at the right lung base previously is no longer visualized. The patient's nipples are more inferiorly located and not included on the image due to their low position. Thoracic and cervical spine degenerative changes. Moderate bilateral glenohumeral joint degenerative changes. IMPRESSION: 1. No nipple shadow or right lung nodule. 2. Mild interstitial pulmonary edema with progression. 3. Stable cardiomegaly and small left pleural effusion. Electronically Signed   By: Claudie Revering M.D.   On: 12/18/2020 09:00   DG Chest Port 1 View  Result Date: 12/18/2020 CLINICAL DATA:  Chest pain EXAM: PORTABLE CHEST 1 VIEW COMPARISON:  November 10, 2020.  September 25, 2020 FINDINGS: Persistent scarring noted in the left upper lobe. There is diffuse interstitial thickening, stable. There is a minimal left pleural effusion. Opacity right base region potentially may represent overlying nipple shadow. Heart is mildly enlarged  with pulmonary vascularity within normal limits. Patient is status post aortic valve replacement. There is aortic atherosclerosis. Bones are osteoporotic. There is degenerative change in the shoulders with a degree of superior migration of each humeral head. IMPRESSION: Scarring left upper lobe, stable. Interstitial thickening throughout the lungs likely represents a degree of chronic bronchitis. Small left pleural effusion. Question nipple shadow right base;  repeat study with nipple markers advised to exclude underlying developing infiltrate in this area. Cardiac prominence, stable. Status post aortic valve replacement. Aortic Atherosclerosis (ICD10-I70.0). Electronically Signed   By: Lowella Grip III M.D.   On: 12/18/2020 08:36    Scheduled Meds: . amLODipine  5 mg Oral Daily  . apixaban  5 mg Oral BID  . aspirin  81 mg Oral Daily  . atorvastatin  40 mg Oral Daily  . cholecalciferol  5,000 Units Oral BID  . fenofibrate  160 mg Oral Daily  . ferrous gluconate  324 mg Oral Q breakfast  . furosemide  40 mg Intravenous Daily  . losartan  100 mg Oral Daily  . melatonin  3 mg Oral QHS  . metoprolol tartrate  50 mg Oral BID  . potassium chloride  10 mEq Oral Daily  . sodium chloride flush  3 mL Intravenous Q12H  . terazosin  10 mg Oral Q1200   Continuous Infusions: . sodium chloride       LOS: 1 day   Time spent: 40 minutes. More than 50% of the time was spent in counseling/coordination of care  Lorella Nimrod, MD [facility]  If 7PM-7AM, please contact night-coverage [email or web address]  12/19/2020, 3:52 PM   This record has been created using Systems analyst. Errors have been sought and corrected,but may not always be located. Such creation errors do not reflect on the standard of care.

## 2020-12-20 ENCOUNTER — Inpatient Hospital Stay (HOSPITAL_COMMUNITY): Payer: Medicare Other

## 2020-12-20 DIAGNOSIS — I5023 Acute on chronic systolic (congestive) heart failure: Secondary | ICD-10-CM

## 2020-12-20 DIAGNOSIS — I5031 Acute diastolic (congestive) heart failure: Secondary | ICD-10-CM | POA: Diagnosis not present

## 2020-12-20 DIAGNOSIS — I4891 Unspecified atrial fibrillation: Secondary | ICD-10-CM

## 2020-12-20 LAB — BASIC METABOLIC PANEL
Anion gap: 7 (ref 5–15)
BUN: 16 mg/dL (ref 8–23)
CO2: 25 mmol/L (ref 22–32)
Calcium: 8.9 mg/dL (ref 8.9–10.3)
Chloride: 106 mmol/L (ref 98–111)
Creatinine, Ser: 0.73 mg/dL (ref 0.44–1.00)
GFR, Estimated: >60 mL/min (ref >60)
Glucose, Bld: 128 mg/dL — ABNORMAL HIGH (ref 70–99)
Potassium: 3.5 mmol/L (ref 3.5–5.1)
Sodium: 138 mmol/L (ref 135–145)

## 2020-12-20 LAB — ECHOCARDIOGRAM COMPLETE
AR max vel: 1.64 cm2
AV Area VTI: 1.67 cm2
AV Area mean vel: 1.7 cm2
AV Mean grad: 12.2 mmHg
AV Peak grad: 24.3 mmHg
Ao pk vel: 2.46 m/s
Height: 60 in
P 1/2 time: 401 msec
S' Lateral: 5 cm
Weight: 2352.75 oz

## 2020-12-20 MED ORDER — TRAZODONE HCL 50 MG PO TABS
25.0000 mg | ORAL_TABLET | Freq: Every day | ORAL | Status: DC
  Filled 2020-12-20: qty 1

## 2020-12-20 MED ORDER — FUROSEMIDE 20 MG PO TABS
20.0000 mg | ORAL_TABLET | Freq: Every day | ORAL | Status: DC
  Administered 2020-12-21: 20 mg via ORAL
  Filled 2020-12-20: qty 1

## 2020-12-20 NOTE — Progress Notes (Signed)
  Echocardiogram 2D Echocardiogram has been performed.  Tara Fields 12/20/2020, 12:18 PM

## 2020-12-20 NOTE — Consult Note (Addendum)
Cardiology Consultation:   Patient ID: Tara Fields MRN: [id number]; DOB: Dec 27, 1927  Admit date: 12/18/2020 Date of Consult: 12/20/2020  PCP:  [name], NP   [facility] Providers Cardiologist:  Lauree Chandler, MD 08/19/2016 Structural Heart: Tara Jonni Sanger FNP 03/25/2020   Patient Profile:   Tara Fields is a 87 y.o. female with a hx of severe AS s/p TAVR at [facility] 2020, COPD, CAD, GERD, persistent atrial fibrillation, DM, HTN, HLD, bilat carotid artery occlusion s/p stent R-ICA, tobacco abuse and diverticulitis, who is being seen 12/20/2020 for the evaluation of CHF w/ decreased EF at the request of Dr Florene Glen.  History of Present Illness:   Tara. Kotter lives at [facility]. She is oriented to name and place. Answers some questions well, however memory is poor.  She came to the hospital w/ SOB and says her nerves were bad. Son helps in her care but is not present.   She denies chest pain, says there is tightness in her epigastric area. Says is goes around to her back. Cannot say how long the pain has been there. Thinks maybe a year. ER notes indicate she was sleeping in a recliner. She was not on diuretics pta, son said they made her electrolytes abnl.   Has had LE edema, describes orthopnea, denies PND. +SOB with exertion.   She walks poorly due to OA, uses a Rollator. Tara issues seem to limit ambulation more than SOB.  She does not feel her heart out of rhythm. Denies presyncope or syncope. Has fallen a couple of times in the past 2 years, they were mechanical falls.   Past Medical History:  Diagnosis Date  . Aortic stenosis    s/p TAVR 26 mm Edwards Sapien 3 THV July 2020  . CAD (coronary artery disease)   . COPD (chronic obstructive pulmonary disease) ([place])   . Diabetes mellitus ([place])   . Diverticulitis   . GERD (gastroesophageal reflux disease)   . HTN (hypertension)   . Hyperlipidemia   . Legally blind in left eye, as defined in [place]    . Tobacco abuse     Past Surgical History:  Procedure Laterality Date  . ABDOMINAL HYSTERECTOMY    . APPENDECTOMY    . BACK SURGERY    . CATARACT EXTRACTION, BILATERAL    . CHOLECYSTECTOMY       Home Medications:  Prior to Admission medications   Medication Sig Start Date End Date Taking? Authorizing Provider  acetaminophen (TYLENOL) 500 MG tablet Take 500 mg by mouth every 6 (six) hours as needed for mild pain (or headaches).   Yes [provider]  amLODipine (NORVASC) 5 MG tablet TAKE 1 TABLET BY MOUTH EVERY DAY Patient taking differently: Take 5 mg by mouth in the morning. 10/22/20  Yes Nafziger, Tommi Rumps, NP  aspirin 81 MG chewable tablet Chew 1 tablet (81 mg total) by mouth daily. 04/01/20  Yes Nafziger, Tommi Rumps, NP  atorvastatin (LIPITOR) 40 MG tablet TAKE 1 TABLET BY MOUTH DAILY AT 6 PM. Patient taking differently: Take 40 mg by mouth every evening. 10/09/20  Yes Nafziger, Tommi Rumps, NP  Cholecalciferol (VITAMIN D-3 PO) Take 1,000 Units by mouth 2 (two) times daily.   Yes [provider]  Coenzyme Q10 (CO Q-10) 300 MG CAPS Take 300 mg by mouth in the morning.   Yes [provider]  ELIQUIS 5 MG TABS tablet TAKE 1 TABLET BY MOUTH TWICE A DAY Patient taking differently: Take 5 mg by mouth See admin instructions.  Take 5 mg by mouth in the morning and evening 10/22/20  Yes Nafziger, Tommi Rumps, NP  fenofibrate 160 MG tablet TAKE 1 TABLET BY MOUTH EVERY DAY Patient taking differently: Take 160 mg by mouth every evening. 10/22/20  Yes Nafziger, Tommi Rumps, NP  ferrous gluconate (FERGON) 324 MG tablet Take 1 tablet (324 mg total) by mouth daily with breakfast. 10/09/20  Yes Nafziger, Tommi Rumps, NP  losartan (COZAAR) 100 MG tablet TAKE 1 TABLET BY MOUTH EVERY DAY Patient taking differently: Take 100 mg by mouth in the morning. 11/10/20  Yes Burnell Blanks, MD  metoprolol tartrate (LOPRESSOR) 50 MG tablet TAKE 1 TABLET BY MOUTH TWICE A DAY Patient taking differently: Take 50 mg by mouth  See admin instructions. Take 50 mg by mouth in the morning and evening 10/22/20  Yes Nafziger, Tommi Rumps, NP  Multiple Vitamins-Minerals (CENTRUM SILVER 50+WOMEN) TABS Take 1 tablet by mouth daily with breakfast.   Yes [provider]  NASAL SPRAY SALINE NA Place 1 spray into both nostrils as needed (for congestion).   Yes [provider]  Omega-3 Fatty Acids (FISH OIL) 1200 MG CAPS Take 1,200 mg by mouth in the morning.   Yes [provider]  psyllium (METAMUCIL) 58.6 % powder Take 1 packet by mouth daily as needed (for constipation).   Yes [provider]  terazosin (HYTRIN) 10 MG capsule Take 1 capsule (10 mg total) by mouth daily at 12 noon. Patient taking differently: Take 10 mg by mouth every evening. 12/16/20  Yes Nafziger, Tommi Rumps, NP  traMADol (ULTRAM) 50 MG tablet TAKE 1 TABLET (50 MG TOTAL) BY MOUTH IN THE MORNING, AT NOON, AND AT BEDTIME. Patient taking differently: Take 50 mg by mouth 3 (three) times daily with meals. 10/03/20  Yes Nafziger, Tommi Rumps, NP    Inpatient Medications: Scheduled Meds: . amLODipine  5 mg Oral Daily  . apixaban  5 mg Oral BID  . aspirin  81 mg Oral Daily  . atorvastatin  40 mg Oral Daily  . cholecalciferol  5,000 Units Oral BID  . fenofibrate  160 mg Oral Daily  . ferrous gluconate  324 mg Oral Q breakfast  . [START ON 12/21/2020] furosemide  20 mg Oral Daily  . losartan  100 mg Oral Daily  . melatonin  3 mg Oral QHS  . metoprolol tartrate  50 mg Oral BID  . potassium chloride  10 mEq Oral Daily  . sodium chloride flush  3 mL Intravenous Q12H  . terazosin  10 mg Oral Q1200  . traZODone  25 mg Oral QHS   Continuous Infusions: . sodium chloride     PRN Meds: sodium chloride, acetaminophen, antiseptic oral rinse, labetalol, ondansetron (ZOFRAN) IV, sodium chloride flush, traMADol  Allergies:    Allergies  Allergen Reactions  . Amlodipine Besy-Benazepril Hcl Cough  . Hctz [Hydrochlorothiazide] Other (See Comments)    Per  family, every diuretic the patient has been on flushes out her out and ultimately results in CRAMPING/JAUNDICE (added potassium might help)  . Other Other (See Comments)    History of diverticulitis- CANNOT TOLERATE NUTS, CORN, AND ANY FOODS NOT EASILY DIGESTED- the patient AVOIDS these  . Cyclobenzaprine Other (See Comments)    Confusion- does not tolerate well   . Diclofenac Sodium Diarrhea  . Hydralazine Other (See Comments)    Per family, every diuretic the patient has been on flushes out her out and ultimately results in CRAMPING/JAUNDICE (added potassium might help)  . Methylprednisolone Other (See Comments)  Medrol/CORTICOSTEROIDS = A LOT OF ENERGY  . Oxycodone Other (See Comments)    Caused patient to "act crazy"    Social History:   Social History   Socioeconomic History  . Marital status: Widowed    Spouse name: Not on file  . Number of children: 2  . Years of education: Not on file  . Highest education level: High school graduate  Occupational History  . Occupation: Retired    Comment: homemaker  Tobacco Use  . Smoking status: Former Smoker    Packs/day: 1.00    Years: 70.00    Pack years: 70.00    Types: Cigarettes    Quit date: 12/15/2019    Years since quitting: 1.0  . Smokeless tobacco: Never Used  . Tobacco comment: Pt states quiting a year ago. 01/03/20 [facility]  Vaping Use  . Vaping Use: Never used  Substance and Sexual Activity  . Alcohol use: No    Alcohol/week: 0.0 standard drinks  . Drug use: No  . Sexual activity: Not Currently  Other Topics Concern  . Not on file  Social History Narrative   Widowed- was married for 39 years    Has two sons       Social Determinants of Health   Financial Resource Strain: Low Risk   . Difficulty of Paying Living Expenses: Not hard at all  Food Insecurity: No Food Insecurity  . Worried About Charity fundraiser in the Last Year: Never true  . Ran Out of Food in the Last Year: Never true  Transportation Needs: No  Transportation Needs  . Lack of Transportation (Medical): No  . Lack of Transportation (Non-Medical): No  Physical Activity: Not on file  Stress: Not on file  Social Connections: Not on file  Intimate Partner Violence: Not on file    Family History:   Family History  Problem Relation Age of Onset  . Diabetes Mother   . Stroke Brother   . Cerebral palsy Brother   . Coronary artery disease Neg Hx     Family Status  Relation Name Status  . Mother  Deceased  . Father  Deceased  . Brother  Deceased  . Brother  Deceased  . Neg Hx  (Not Specified)   ROS:  Please see the history of present illness.  All other ROS reviewed and negative.     Physical Exam/Data:   Vitals:   12/20/20 0409 12/20/20 0410 12/20/20 0840 12/20/20 1348  BP: (!) 149/75  (!) 152/83 100/65  Pulse: 78  77 78  Resp: 19  20 20   Temp: 97.8 F (36.6 C)  (!) 97.4 F (36.3 C) 97.8 F (36.6 C)  TempSrc: Oral  Oral Oral  SpO2: 93%  97% 97%  Weight:  66.7 kg    Height:        Intake/Output Summary (Last 24 hours) at 12/20/2020 1601 Last data filed at 12/20/2020 1400 Gross per 24 hour  Intake 723 ml  Output 1601 ml  Net -878 ml   Last 3 Weights 12/20/2020 12/19/2020 12/18/2020  Weight (lbs) 147 lb 0.8 oz 151 lb 10.8 oz 158 lb  Weight (kg) 66.7 kg 68.8 kg 71.668 kg     Body mass index is 28.72 kg/m.  General:  Well nourished, well developed, elderly female in no acute distress HEENT: normal for age Lymph: no adenopathy Neck: JVD 9 cm Endocrine:  No thryomegaly Vascular: No carotid bruits; 4/4 extremity pulses 2+   Cardiac:  normal S1, S2;  Irreg R&R; soft murmur  Lungs:  Decreased BS bases bilaterally, no wheezing, rhonchi, few rales  Abd: soft, nontender, no hepatomegaly  Ext: no edema Musculoskeletal:  No deformities, BUE and BLE strength normal and equal Skin: warm and dry  Neuro:  CNs 2-12 intact, no focal abnormalities noted Psych:  Normal affect   EKG:  The EKG was personally reviewed and  demonstrates:  Atrial fib, HR 73, ? Inc LBBB w/ QRS duration 106 Tara, pair PVCs seen  Telemetry:  Telemetry was personally reviewed and demonstrates:  Atrial fib, RVR at times, runs of VT seen, self-limiting  Relevant CV Studies:  ECHO: 12/20/2020 1. There is prominent septal-lateral left ventricular dyssynchrony due to  LBBB. Left ventricular ejection fraction, by estimation, is 25 to 30%. The  left ventricle has severely decreased function. The left ventricle  demonstrates global hypokinesis. The  left ventricular internal cavity size was mildly dilated. Left ventricular  diastolic function could not be evaluated.  2. Right ventricular systolic function is normal. The right ventricular  size is normal. There is normal pulmonary artery systolic pressure.  3. Left atrial size was severely dilated.  4. The mitral valve is normal in structure. Mild mitral valve  regurgitation. No evidence of mitral stenosis.  5. Mild perivalvular leak. The aortic valve has been repaired/replaced.  Aortic valve regurgitation is mild. There is a 26 mm Sapien prosthetic  (TAVR) valve present in the aortic position. Procedure Date: 03/07/2019 at  [facility]. Aortic regurgitation  PHT measures 401 msec. Aortic valve mean gradient measures 12.2 mmHg.  Aortic valve Vmax measures 2.46 m/s. Aortic valve acceleration time  measures 76 msec.  6. The inferior vena cava is normal in size with greater than 50%  respiratory variability, suggesting right atrial pressure of 3 mmHg.   ECHO: 03/25/2020 at [facility]  1. The left ventricle is mildly dilated in size with mildly increased wall  thickness.  2. The left ventricular systolic function is mildly decreased, LVEF is  visually estimated at 45-50%.  3. There is mild to moderate mitral valve regurgitation.  4. The right ventricle is normal in size, with low normal systolic function.  5. There is mild to moderate tricuspid  regurgitation.  6. There is moderate pulmonary hypertension, estimated pulmonary artery  systolic pressure is 49 mmHg.  7. The right atrium is mildly dilated in size.  8. Well seated #26 S3 Ultra valve in the aortic position. Mild paravalvular  AI. Mean 13 mm Hg DI 0.32 AVA 1.2-1.3 cm2.    CARDIAC CATH: 10/31/2017 at [facility]:   1. Coronary artery disease with 25% occlusion of the middle segment of  the LAD, 50% occlusion of the second diagonal artery, 50% occlusion of the  distal circumflex artery, 75% occlusion of the second diagonal artery  ostium (not a large vessel).     2. Severe aortic stenosis with aortic valve area 1.0 cm.     3. Normal left ventricular systolic function without evidence of  infarction, ejection fraction 60%, by echocardiogram.     4. Mild/moderate pulmonary hypertension, PA pressure 45/17 mmHg.   Plan:   TAVR evaluation   Date of Surgery: 10/31/2017   Appropriate Use Criteria:  CAD Presentation: Shortness of breath  Anginal Classification within last 2 weeks: CCS III.  Anti-Anginal Medications within last 2 weeks: None.  Stress or Imaging Study: Echocardiogram suggested critical aortic stenosis  with aortic valve area 0.7 cm2 and peak approximately 100 mmHg.  Laboratory Data:  High Sensitivity Troponin:   Recent Labs  Lab 12/18/20 0825 12/18/20 1000  TROPONINIHS 8 10     Chemistry Recent Labs  Lab 12/18/20 0825 12/19/20 0409 12/20/20 0244  NA 139 139 138  K 3.6 3.6 3.5  CL 105 105 106  CO2 24 25 25   GLUCOSE 185* 125* 128*  BUN 16 12 16   CREATININE 0.75 0.75 0.73  CALCIUM 9.1 8.9 8.9  GFRNONAA >60 >60 >60  ANIONGAP 10 9 7     Recent Labs  Lab 12/18/20 0825  PROT 6.5  ALBUMIN 3.8  AST 16  ALT 13  ALKPHOS 41  BILITOT 0.9   Hematology Recent Labs  Lab 12/18/20 0825  WBC 6.4  RBC 4.24  HGB 12.2  HCT 38.8  MCV 91.5  MCH 28.8  MCHC 31.4  RDW 15.9*  PLT 271   BNP Recent  Labs  Lab 12/18/20 0825  BNP 1,444.7*    DDimer No results for input(s): DDIMER in the last 168 hours. No results found for: TSH Lab Results  Component Value Date   HGBA1C 5.9 (H) 09/02/2020   No results found for: CHOL, HDL, LDLCALC, LDLDIRECT, TRIG, CHOLHDL   Radiology/Studies:  CT Angio Chest PE W/Cm &/Or Wo Cm  Result Date: 12/18/2020 CLINICAL DATA:  Right AND Left upper quadrant pain since Tuesday and nausea. Increase SOB. History of radiation therapy left lung carcinoma. EXAM: CT ANGIOGRAPHY CHEST CT ABDOMEN AND PELVIS WITH CONTRAST TECHNIQUE: Multidetector CT imaging of the chest was performed using the standard protocol during bolus administration of intravenous contrast. Multiplanar CT image reconstructions and MIPs were obtained to evaluate the vascular anatomy. Multidetector CT imaging of the abdomen and pelvis was performed using the standard protocol during bolus administration of intravenous contrast. CONTRAST:  67mL OMNIPAQUE IOHEXOL 350 MG/ML SOLN COMPARISON:  09/01/2020 FINDINGS: CTA CHEST FINDINGS Cardiovascular: Pulmonary arteries are well opacified. There is no evidence of a pulmonary embolism. Heart is mildly enlarged. Stable changes from previous aortic valve replacement. Three-vessel coronary artery calcifications. No pericardial effusion. Aorta is unopacified. Are aortic and arch branch vessel atherosclerotic calcifications. Mediastinum/Nodes: Normal thyroid. No neck base or axillary masses or enlarged lymph nodes. Mildly enlarged right paratracheal, azygos level lymph node measuring 1.4 cm in short axis. No mediastinal or hilar masses. No other enlarged lymph nodes. Trachea and esophagus are unremarkable. Lungs/Pleura: Bilateral interstitial thickening. Consolidation associated with bronchiectasis extends from the hilum into the left upper lobe. Small areas of opacity are noted at the lung bases consistent with atelectasis. Small bilateral pleural effusions. No pneumothorax.  Musculoskeletal: Old right rib fractures. No acute fracture. No bone lesion. No chest wall masses. Review of the MIP images confirms the above findings. CT ABDOMEN and PELVIS FINDINGS Hepatobiliary: Liver normal in size and attenuation. No masses. Gallbladder surgically absent. Common bile duct dilated to a maximum 1.8 cm with distal tapering, unchanged. No evidence of a duct stone. Pancreas: Unremarkable. No pancreatic ductal dilatation or surrounding inflammatory changes. Spleen: Normal in size without focal abnormality. Adrenals/Urinary Tract: 2.1 cm left adrenal mass, unchanged from the previous CT scan and stable from a chest CT 12/27/2019. Normal right adrenal gland Kidneys normal in size, orientation and position. Symmetric renal enhancement and excretion. Two low-density left renal masses, largest, 1.9 cm, a lower pole renal sinus mass, both consistent with cysts and both stable. No other masses, no stones and no hydronephrosis. Normal ureters. Normal bladder. Stomach/Bowel: Normal stomach. Small bowel and colon are normal in caliber. No wall  thickening. No inflammation. Numerous colonic diverticula most evident along the sigmoid. Mild generalized increase in the colonic stool burden. Vascular/Lymphatic: Dense aortic atherosclerosis. No aneurysm. No enlarged lymph nodes. Reproductive: Uterus and bilateral adnexa are unremarkable. Other: Small midline hernia contains a knuckle of the transverse colon, unchanged from the prior study. No ascites. Musculoskeletal: No fracture. No bone lesion. Previous L4 through S1 posterior lumbar spine fusion, stable from the prior exam. Review of the MIP images confirms the above findings. IMPRESSION: CHEST CTA 1. No evidence of a pulmonary embolism. 2. Congestive heart failure diffuse bilateral interstitial thickening and small bilateral pleural effusions. 3. Left lung opacity extending superiorly from the left hilum consistent with post radiation scarring, stable from the  prior CT. 4. Mild dependent atelectasis. 5. Changes from aortic valve replacement. Coronary artery calcifications and aortic atherosclerosis. ABDOMEN AND PELVIS CT 1. No acute findings within the abdomen or pelvis. 2. Colonic diverticulosis without evidence diverticulitis. Mild increase in the colonic stool burden. 3. Stable small midline paraumbilical hernia. 4. Aortic atherosclerosis. Electronically Signed   By: Lajean Manes M.D.   On: 12/18/2020 11:11   CT Abdomen Pelvis W Contrast  Result Date: 12/18/2020 CLINICAL DATA:  Right AND Left upper quadrant pain since Tuesday and nausea. Increase SOB. History of radiation therapy left lung carcinoma. EXAM: CT ANGIOGRAPHY CHEST CT ABDOMEN AND PELVIS WITH CONTRAST TECHNIQUE: Multidetector CT imaging of the chest was performed using the standard protocol during bolus administration of intravenous contrast. Multiplanar CT image reconstructions and MIPs were obtained to evaluate the vascular anatomy. Multidetector CT imaging of the abdomen and pelvis was performed using the standard protocol during bolus administration of intravenous contrast. CONTRAST:  59mL OMNIPAQUE IOHEXOL 350 MG/ML SOLN COMPARISON:  09/01/2020 FINDINGS: CTA CHEST FINDINGS Cardiovascular: Pulmonary arteries are well opacified. There is no evidence of a pulmonary embolism. Heart is mildly enlarged. Stable changes from previous aortic valve replacement. Three-vessel coronary artery calcifications. No pericardial effusion. Aorta is unopacified. Are aortic and arch branch vessel atherosclerotic calcifications. Mediastinum/Nodes: Normal thyroid. No neck base or axillary masses or enlarged lymph nodes. Mildly enlarged right paratracheal, azygos level lymph node measuring 1.4 cm in short axis. No mediastinal or hilar masses. No other enlarged lymph nodes. Trachea and esophagus are unremarkable. Lungs/Pleura: Bilateral interstitial thickening. Consolidation associated with bronchiectasis extends from the  hilum into the left upper lobe. Small areas of opacity are noted at the lung bases consistent with atelectasis. Small bilateral pleural effusions. No pneumothorax. Musculoskeletal: Old right rib fractures. No acute fracture. No bone lesion. No chest wall masses. Review of the MIP images confirms the above findings. CT ABDOMEN and PELVIS FINDINGS Hepatobiliary: Liver normal in size and attenuation. No masses. Gallbladder surgically absent. Common bile duct dilated to a maximum 1.8 cm with distal tapering, unchanged. No evidence of a duct stone. Pancreas: Unremarkable. No pancreatic ductal dilatation or surrounding inflammatory changes. Spleen: Normal in size without focal abnormality. Adrenals/Urinary Tract: 2.1 cm left adrenal mass, unchanged from the previous CT scan and stable from a chest CT 12/27/2019. Normal right adrenal gland Kidneys normal in size, orientation and position. Symmetric renal enhancement and excretion. Two low-density left renal masses, largest, 1.9 cm, a lower pole renal sinus mass, both consistent with cysts and both stable. No other masses, no stones and no hydronephrosis. Normal ureters. Normal bladder. Stomach/Bowel: Normal stomach. Small bowel and colon are normal in caliber. No wall thickening. No inflammation. Numerous colonic diverticula most evident along the sigmoid. Mild generalized increase in the colonic stool  burden. Vascular/Lymphatic: Dense aortic atherosclerosis. No aneurysm. No enlarged lymph nodes. Reproductive: Uterus and bilateral adnexa are unremarkable. Other: Small midline hernia contains a knuckle of the transverse colon, unchanged from the prior study. No ascites. Musculoskeletal: No fracture. No bone lesion. Previous L4 through S1 posterior lumbar spine fusion, stable from the prior exam. Review of the MIP images confirms the above findings. IMPRESSION: CHEST CTA 1. No evidence of a pulmonary embolism. 2. Congestive heart failure diffuse bilateral interstitial  thickening and small bilateral pleural effusions. 3. Left lung opacity extending superiorly from the left hilum consistent with post radiation scarring, stable from the prior CT. 4. Mild dependent atelectasis. 5. Changes from aortic valve replacement. Coronary artery calcifications and aortic atherosclerosis. ABDOMEN AND PELVIS CT 1. No acute findings within the abdomen or pelvis. 2. Colonic diverticulosis without evidence diverticulitis. Mild increase in the colonic stool burden. 3. Stable small midline paraumbilical hernia. 4. Aortic atherosclerosis. Electronically Signed   By: Lajean Manes M.D.   On: 12/18/2020 11:11   DG Chest Port 1 View  Result Date: 12/19/2020 CLINICAL DATA:  Chest pain EXAM: PORTABLE CHEST 1 VIEW COMPARISON:  Dec 18, 2020 chest radiograph and CT chest FINDINGS: There is persistent airspace opacity in the left upper lobe. There is a left pleural effusion. There is interstitial prominence bilaterally, likely due to a degree of interstitial pulmonary edema. There is cardiomegaly with pulmonary vascularity normal. Patient is status post aortic valve replacement. There is aortic atherosclerosis. No adenopathy. There is superior migration of the left humeral head. IMPRESSION: Persistent left upper lobe airspace opacity, likely pneumonia. Left pleural effusion. Right pleural effusions seen on recent CT not appreciable by radiography. Suspect mild interstitial pulmonary edema.  Stable cardiomegaly. Status post aortic valve replacement. Aortic Atherosclerosis (ICD10-I70.0). Electronically Signed   By: Lowella Grip III M.D.   On: 12/19/2020 08:47   DG Chest Port 1 View  Result Date: 12/18/2020 CLINICAL DATA:  Chest pain. Possible nipple shadow at the right lung base on a portable chest earlier today. EXAM: PORTABLE CHEST 1 VIEW COMPARISON:  Earlier today. FINDINGS: Stable enlarged cardiac silhouette. Mild increase in prominence of the interstitial markings. Linear scarring in the left upper  lobe is again noted. Stable small left pleural effusion. The nodular density seen at the right lung base previously is no longer visualized. The patient's nipples are more inferiorly located and not included on the image due to their low position. Thoracic and cervical spine degenerative changes. Moderate bilateral glenohumeral joint degenerative changes. IMPRESSION: 1. No nipple shadow or right lung nodule. 2. Mild interstitial pulmonary edema with progression. 3. Stable cardiomegaly and small left pleural effusion. Electronically Signed   By: Claudie Revering M.D.   On: 12/18/2020 09:00   DG Chest Port 1 View  Result Date: 12/18/2020 CLINICAL DATA:  Chest pain EXAM: PORTABLE CHEST 1 VIEW COMPARISON:  November 10, 2020.  September 25, 2020 FINDINGS: Persistent scarring noted in the left upper lobe. There is diffuse interstitial thickening, stable. There is a minimal left pleural effusion. Opacity right base region potentially may represent overlying nipple shadow. Heart is mildly enlarged with pulmonary vascularity within normal limits. Patient is status post aortic valve replacement. There is aortic atherosclerosis. Bones are osteoporotic. There is degenerative change in the shoulders with a degree of superior migration of each humeral head. IMPRESSION: Scarring left upper lobe, stable. Interstitial thickening throughout the lungs likely represents a degree of chronic bronchitis. Small left pleural effusion. Question nipple shadow right base; repeat study  with nipple markers advised to exclude underlying developing infiltrate in this area. Cardiac prominence, stable. Status post aortic valve replacement. Aortic Atherosclerosis (ICD10-I70.0). Electronically Signed   By: Lowella Grip III M.D.   On: 12/18/2020 08:36   ECHOCARDIOGRAM COMPLETE  Result Date: 12/20/2020    ECHOCARDIOGRAM REPORT   Patient Name:   STORM DULSKI Date of Exam: 12/20/2020 Medical Rec #:  [id number]          Height:       60.0 in  Accession #:    [id number]         Weight:       147.0 lb Date of Birth:  Nov 25, 1927           BSA:          1.638 m Patient Age:    64 years           BP:           152/83 mmHg Patient Gender: F                  HR:           83 bpm. Exam Location:  Inpatient Procedure: 2D Echo Indications:    acute diastolic chf  History:        Patient has prior history of Echocardiogram examinations, most                 recent 09/13/2014. CHF, COPD, Arrythmias:Atrial Fibrillation;                 Risk Factors:Diabetes, Hypertension and Dyslipidemia.                 Aortic Valve: 26 mm Sapien prosthetic, stented (TAVR) valve is                 present in the aortic position. Procedure Date: 03/07/2019 at                 [facility].  Sonographer:    Johny Chess Referring Phys: [id number] Echo  1. There is prominent septal-lateral left ventricular dyssynchrony due to LBBB. Left ventricular ejection fraction, by estimation, is 25 to 30%. The left ventricle has severely decreased function. The left ventricle demonstrates global hypokinesis. The left ventricular internal cavity size was mildly dilated. Left ventricular diastolic function could not be evaluated.  2. Right ventricular systolic function is normal. The right ventricular size is normal. There is normal pulmonary artery systolic pressure.  3. Left atrial size was severely dilated.  4. The mitral valve is normal in structure. Mild mitral valve regurgitation. No evidence of mitral stenosis.  5. Mild perivalvular leak. The aortic valve has been repaired/replaced. Aortic valve regurgitation is mild. There is a 26 mm Sapien prosthetic (TAVR) valve present in the aortic position. Procedure Date: 03/07/2019 at [facility]. Aortic regurgitation PHT measures 401 msec. Aortic valve mean gradient measures 12.2 mmHg. Aortic valve Vmax measures 2.46 m/s. Aortic valve acceleration time measures 76 msec.  6. The inferior vena cava is normal in size with greater  than 50% respiratory variability, suggesting right atrial pressure of 3 mmHg. Comparison(s): A prior study was performed on 03/25/2020. Prior images unable to be directly viewed, comparison made by report only. The left ventricular function is significantly worse. 03/25/2020 study at [place] reports LVEF 45-50%, similar TAVR gradients and mild perivalvular leak. LBBB is not mentioned on that report. FINDINGS  Left Ventricle: There is prominent septal-lateral left  ventricular dyssynchrony due to LBBB. Left ventricular ejection fraction, by estimation, is 25 to 30%. The left ventricle has severely decreased function. The left ventricle demonstrates global hypokinesis. The left ventricular internal cavity size was mildly dilated. There is no left ventricular hypertrophy. Abnormal (paradoxical) septal motion, consistent with left bundle branch block. Left ventricular diastolic function could not be evaluated due to atrial fibrillation. Left ventricular diastolic function could not be evaluated. Right Ventricle: The right ventricular size is normal. No increase in right ventricular wall thickness. Right ventricular systolic function is normal. There is normal pulmonary artery systolic pressure. The tricuspid regurgitant velocity is 2.61 m/s, and  with an assumed right atrial pressure of 3 mmHg, the estimated right ventricular systolic pressure is 36.4 mmHg. Left Atrium: Left atrial size was severely dilated. Right Atrium: Right atrial size was normal in size. Pericardium: There is no evidence of pericardial effusion. Mitral Valve: The mitral valve is normal in structure. Mild to moderate mitral annular calcification. Mild mitral valve regurgitation, with centrally-directed jet. No evidence of mitral valve stenosis. Tricuspid Valve: The tricuspid valve is normal in structure. Tricuspid valve regurgitation is mild. Aortic Valve: Mild perivalvular leak. The aortic valve has been repaired/replaced. Aortic valve  regurgitation is mild. Aortic regurgitation PHT measures 401 msec. Aortic valve mean gradient measures 12.2 mmHg. Aortic valve peak gradient measures 24.3 mmHg. Aortic valve area, by VTI measures 1.67 cm. There is a 26 mm Sapien prosthetic, stented (TAVR) valve present in the aortic position. Procedure Date: 03/07/2019 at [facility]. Pulmonic Valve: The pulmonic valve was grossly normal. Pulmonic valve regurgitation is mild. Aorta: The aortic root and ascending aorta are structurally normal, with no evidence of dilitation. Venous: The inferior vena cava is normal in size with greater than 50% respiratory variability, suggesting right atrial pressure of 3 mmHg. IAS/Shunts: No atrial level shunt detected by color flow Doppler.  LEFT VENTRICLE PLAX 2D LVIDd:         5.70 cm LVIDs:         5.00 cm LV PW:         1.20 cm LV IVS:        1.00 cm LVOT diam:     2.00 cm LV SV:         71 LV SV Index:   44 LVOT Area:     3.14 cm  RIGHT VENTRICLE         IVC TAPSE (M-mode): 1.5 cm  IVC diam: 1.80 cm LEFT ATRIUM             Index       RIGHT ATRIUM           Index LA diam:        5.10 cm 3.11 cm/m  RA Area:     18.00 cm LA Vol (A2C):   96.2 ml 58.73 ml/m RA Volume:   47.70 ml  29.12 ml/m LA Vol (A4C):   77.0 ml 47.01 ml/m LA Biplane Vol: 87.6 ml 53.48 ml/m  AORTIC VALVE AV Area (Vmax):    1.64 cm AV Area (Vmean):   1.70 cm AV Area (VTI):     1.67 cm AV Vmax:           246.47 cm/s AV Vmean:          159.496 cm/s AV VTI:            0.427 m AV Peak Grad:      24.3 mmHg AV Mean Grad:  12.2 mmHg LVOT Vmax:         128.50 cm/s LVOT Vmean:        86.400 cm/s LVOT VTI:          0.227 m LVOT/AV VTI ratio: 0.53 AI PHT:            401 msec  AORTA Ao Root diam: 3.20 cm Ao Asc diam:  3.00 cm TRICUSPID VALVE TR Peak grad:   27.2 mmHg TR Vmax:        261.00 cm/s  SHUNTS Systemic VTI:  0.23 m Systemic Diam: 2.00 cm Dani Gobble Croitoru MD Electronically signed by Sanda Klein MD Signature Date/Time: 12/20/2020/2:15:05 PM    Final       Assessment and Plan:   1. Acute on chronic systolic CHF - volume overload by exam, by CXR and BNP - EF was 45-50% 2021 echo, now 25-30% w/ global HK - she has had 3 doses IV Lasix 40 mg and is to go on 20mg  po qd in am - discuss continuing IV Lasix for at least 1 more day - renal function and electrolytes are ok - she is on home rx of losartan 100 mg qd, lopressor 50 mg bid - discuss adding spiro or change losartan to Entresto  2. Atrial fib, RVR, NSVT - Lopressor 50 mg bid is pta dose - HR is elevated some of the time, no pauses > 2.5 sec and no bradycardia - to help w/ VT, consider increasing BB to 75 mg bid and see how tolerated  3. Hx CAD - cath 2019 pre-TAVR is above, 50% mD2 & dCFX, 75% oD2 >> med rx -  No WMA on current echo, but EF is decreased - valve is stable  4. AS s/p TAVR - See echo report above, valve is well-seated w/ mean gradient 12.2. mmHg    Risk Assessment/Risk Scores:     HEAR Score (for undifferentiated chest pain):  HEAR Score: 4  New York Heart Association (NYHA) Functional Class NYHA Class III  CHA2DS2-VASc Score = 7  This indicates a 11.2% annual risk of stroke. The patient's score is based upon: CHF History: Yes HTN History: Yes Diabetes History: Yes Stroke History: No Vascular Disease History: Yes Age Score: 2 Gender Score: 1         For questions or updates, please contact [employer] Please consult [email or web address] for contact info under    Signed, Rosaria Ferries, PA-C  12/20/2020 4:01 PM   History and all data above reviewed.  Patient examined.  I agree with the findings as above.   I called and spoke with her son at length.  Also was able to give me a relatively clear history.  Her biggest complaint is her joint pain which is limiting.  Her real presenting complaint was more of some abdominal discomfort.  She has this discomfort across the upper epigastric area.  Was under the rib cage.  There was no associated substernal  pain.  There is no jaw or arm pain.  She was having some lower extremity swelling and some shortness of breath.  When she got into the emergency room her son said she urinated quite a bit after the dose of IV Lasix.  She had 2 doses that she seems to be comfortable now.  She is not having any new shortness of breath, PND or orthopnea.  Not having any palpitations like ectopy on the monitor.  She is not describing PND or orthopnea.  She had a recent  fall.  The patient exam reveals COR: Regular rate and rhythm, soft apical systolic murmur,  Lungs: Clear to auscultation bilaterally,  Abd: Positive bowel sounds no frequency., Ext, no edema.  All available labs, radiology testing, previous records reviewed. Agree with documented assessment and plan.  Acute on chronic systolic heart failure: Her ejection fraction is reduced compared to the ones done in [place] last year.  She has not had any enzyme elevation.  After conversation with her son we agreed that we will pursue conservative management with medications.  She has not been on daily Lasix and part because she has low potassium with Lasix but I think she is probably going to need at least a low-dose p.o. with some potassium supplementation and close follow-up.  I would continue the losartan.  Blood pressure somewhat labile.  I am not planning any change in her beta-blocker at this point.  I would not consider an invasive procedure and her son agrees with this.   Of note I am going to discontinue her amlodipine in anticipation that I might titrate up her beta-blocker further before discharge.  Jeneen Rinks Audie Wieser  5:04 PM  12/20/2020

## 2020-12-20 NOTE — Progress Notes (Signed)
PROGRESS NOTE    Tara Fields  VOJ:[id number] DOB: 1927/08/25 DOA: 12/18/2020 PCP: [name], NP   Chief Complaint  Patient presents with  . Abdominal Pain  . Nausea    Brief Narrative:  Tara Fields Tara Fields 87 y.o.femalewith medical history significant ofchronic systolic CHF(LVEF 45 to 99% in 2020 at [facility]), HTN, aortic stenosis s/p TAVR 2018, paroxysmal Tara Fields. fib on Eliquis, presented with increasing shortness of breath, chest pain and leg swelling.  She's been diuresed and has improved from as respiratory status.  EF has worsened, cardiology has been consulted.    Assessment & Plan:   Active Problems:   Acute exacerbation of CHF (congestive heart failure) (HCC)   CHF (congestive heart failure) (HCC)   Pressure injury of skin  Acute hypoxic respiratory failure secondary to acute on chronic HFrEF and Tara Fields. fib with RVR. - Currently on RA today - CXR 5/6 with persistent L upper lobe opacity, ? Pneumonia.  Mild interstitial pulm edema. - CT 5/5 with CHF diffuse bilateral interstitial thickening and small bilateral pleural effusions, lung opacity extending superiorly from L hilum c/w post radiation scarring - appears euvolemic, transition to PO lasix - echo with worsened EF -> now 25-30%, global hypokinesis, mild perivalvular leak, 26 mm sapien prosthetic valve in aortic position (see report) - given worsened EF, will c/s cardiology - Continue losartan, metoprolol (plan to consolidate) -Strict I/O, daily weights  Chronic atrial fibrillation.  Rate controlled with increase in metoprolol. -Continue metoprolol and Eliquis.  Left lower lobe lung cancer s/p radiation.  Lung scarring secondary to radiation seems stable on CT scan. -Continue outpatient pulmonology and oncology follow-up  Hx AS s/p TAVR 2018.   - echo as noted above, mild perivalvular leak, 26 mm sapien prosthetic valve in the aortic position (see report) - appreciate cardiology  recs  Hypertension.  Blood pressure within goal. -Continue amlodipine, losartan and Lasix  Chest pain resolved.  Troponin remain negative.  Delirium  Concern for Cognitive Deficit vs Dementia  Delirium precautions Trazodone for sleep Neurology follow outpatient  Suicidal Ideation This occurred overnight, sounds like she was delirious.  Today, she denies SI and depressive symptoms.  Says she thinks she was frightened, confused, can't completely explain what happened the other night.  Symptoms sound c/w delirium, which would fit in acutely hospitalized 87 yo.  Don't think she needs psych c/s at this time as she's currently denying any symptoms.  Discussed with pt/son, both expressed understanding.  Will continue to monitor.  DVT prophylaxis: eliquis Code Status: full  Family Communication: son over phoone Disposition:   Status is: Inpatient  Remains inpatient appropriate because:Inpatient level of care appropriate due to severity of illness   Dispo: The patient is from: Home              Anticipated d/c is to: ALF              Patient currently is not medically stable to d/c.   Difficult to place patient No       Consultants:  cardiology  Procedures:  Echo IMPRESSIONS    1. There is prominent septal-lateral left ventricular dyssynchrony due to  LBBB. Left ventricular ejection fraction, by estimation, is 25 to 30%. The  left ventricle has severely decreased function. The left ventricle  demonstrates global hypokinesis. The  left ventricular internal cavity size was mildly dilated. Left ventricular  diastolic function could not be evaluated.  2. Right ventricular systolic function is normal. The right ventricular  size  is normal. There is normal pulmonary artery systolic pressure.  3. Left atrial size was severely dilated.  4. The mitral valve is normal in structure. Mild mitral valve  regurgitation. No evidence of mitral stenosis.  5. Mild perivalvular leak.  The aortic valve has been repaired/replaced.  Aortic valve regurgitation is mild. There is Tara Fields 26 mm Sapien prosthetic  (TAVR) valve present in the aortic position. Procedure Date: 03/07/2019 at  [facility]. Aortic regurgitation  PHT measures 401 msec. Aortic valve mean gradient measures 12.2 mmHg.  Aortic valve Vmax measures 2.46 m/s. Aortic valve acceleration time  measures 76 msec.  6. The inferior vena cava is normal in size with greater than 50%  respiratory variability, suggesting right atrial pressure of 3 mmHg.   Comparison(s): Tara Fields prior study was performed on 03/25/2020. Prior images  unable to be directly viewed, comparison made by report only. The left  ventricular function is significantly worse. 03/25/2020 study at [facility] reports LVEF 45-50%,  similar TAVR gradients and mild perivalvular leak. LBBB is not mentioned  on that report.   Antimicrobials: Anti-infectives (From admission, onward)   None         Subjective: No new complaints Doesn't know what happened the other night, was frightened - denies depression or anxiety - denies suicidal thoughts/plans.  Discussed with son who said she was babbling that night, trying to call 911, trying to get out of there - didn't make any overtly suicidal statements, but said things like "I won't be around after 10", implying SI.  Discussed sounds like delirium, will hold off on psych c/s at this time, both son, pt agreeable   Objective: Vitals:   12/20/20 0409 12/20/20 0410 12/20/20 0840 12/20/20 1348  BP: (!) 149/75  (!) 152/83 100/65  Pulse: 78  77 78  Resp: 19  20 20   Temp: 97.8 F (36.6 C)  (!) 97.4 F (36.3 C) 97.8 F (36.6 C)  TempSrc: Oral  Oral Oral  SpO2: 93%  97% 97%  Weight:  66.7 kg    Height:        Intake/Output Summary (Last 24 hours) at 12/20/2020 1421 Last data filed at 12/20/2020 1211 Gross per 24 hour  Intake 843 ml  Output 500 ml  Net 343 ml   Filed Weights   12/18/20 0801 12/19/20  0514 12/20/20 0410  Weight: 71.7 kg 68.8 kg 66.7 kg    Examination:  General exam: Appears calm and comfortable  Respiratory system: Clear to auscultation. Respiratory effort normal. Cardiovascular system: irregularly irregular, no apparent JVD Gastrointestinal system: Abdomen is nondistended, soft and nontender. Central nervous system: Alert and oriented. No focal neurological deficits. Extremities: no LEE Skin: No rashes, lesions or ulcers Psychiatry: Judgement and insight appear normal. Mood & affect appropriate.     Data Reviewed: I have personally reviewed following labs and imaging studies  CBC: Recent Labs  Lab 12/18/20 0825  WBC 6.4  NEUTROABS 5.5  HGB 12.2  HCT 38.8  MCV 91.5  PLT 211    Basic Metabolic Panel: Recent Labs  Lab 12/18/20 0825 12/18/20 1846 12/19/20 0409 12/20/20 0244  NA 139  --  139 138  K 3.6  --  3.6 3.5  CL 105  --  105 106  CO2 24  --  25 25  GLUCOSE 185*  --  125* 128*  BUN 16  --  12 16  CREATININE 0.75  --  0.75 0.73  CALCIUM 9.1  --  8.9 8.9  MG  --  1.8  --   --   PHOS  --  3.5  --   --     GFR: Estimated Creatinine Clearance: 39.8 mL/min (by C-G formula based on SCr of 0.73 mg/dL).  Liver Function Tests: Recent Labs  Lab 12/18/20 0825  AST 16  ALT 13  ALKPHOS 41  BILITOT 0.9  PROT 6.5  ALBUMIN 3.8    CBG: No results for input(s): GLUCAP in the last 168 hours.   Recent Results (from the past 240 hour(s))  Resp Panel by RT-PCR (Flu Revonda Menter&B, Covid) Nasopharyngeal Swab     Status: None   Collection Time: 12/18/20  8:25 AM   Specimen: Nasopharyngeal Swab; Nasopharyngeal(NP) swabs in vial transport medium  Result Value Ref Range Status   SARS Coronavirus 2 by RT PCR NEGATIVE NEGATIVE Final    Comment: (NOTE) SARS-CoV-2 target nucleic acids are NOT DETECTED.  The SARS-CoV-2 RNA is generally detectable in upper respiratory specimens during the acute phase of infection. The lowest concentration of SARS-CoV-2 viral  copies this assay can detect is 138 copies/mL. Tara Fields negative result does not preclude SARS-Cov-2 infection and should not be used as the sole basis for treatment or other patient management decisions. Tara Fields negative result may occur with  improper specimen collection/handling, submission of specimen other than nasopharyngeal swab, presence of viral mutation(s) within the areas targeted by this assay, and inadequate number of viral copies(<138 copies/mL). Tara Fields negative result must be combined with clinical observations, patient history, and epidemiological information. The expected result is Negative.  Fact Sheet for Patients:  EntrepreneurPulse.com.au  Fact Sheet for Healthcare Providers:  IncredibleEmployment.be  This test is no t yet approved or cleared by the [place] FDA and  has been authorized for detection and/or diagnosis of SARS-CoV-2 by FDA under an Emergency Use Authorization (EUA). This EUA will remain  in effect (meaning this test can be used) for the duration of the COVID-19 declaration under Section 564(b)(1) of the Act, 21 U.S.C.section 360bbb-3(b)(1), unless the authorization is terminated  or revoked sooner.       Influenza Tara Fields by PCR NEGATIVE NEGATIVE Final   Influenza B by PCR NEGATIVE NEGATIVE Final    Comment: (NOTE) The Xpert Xpress SARS-CoV-2/FLU/RSV plus assay is intended as an aid in the diagnosis of influenza from Nasopharyngeal swab specimens and should not be used as Tara Fields sole basis for treatment. Nasal washings and aspirates are unacceptable for Xpert Xpress SARS-CoV-2/FLU/RSV testing.  Fact Sheet for Patients: EntrepreneurPulse.com.au  Fact Sheet for Healthcare Providers: IncredibleEmployment.be  This test is not yet approved or cleared by the [place] FDA and has been authorized for detection and/or diagnosis of SARS-CoV-2 by FDA under an Emergency Use Authorization (EUA). This  EUA will remain in effect (meaning this test can be used) for the duration of the COVID-19 declaration under Section 564(b)(1) of the Act, 21 U.S.C. section 360bbb-3(b)(1), unless the authorization is terminated or revoked.  Performed at [facility], [address] [place], [place] [address]          Radiology Studies: DG Chest [facility] 1 View  Result Date: 12/19/2020 CLINICAL DATA:  Chest pain EXAM: PORTABLE CHEST 1 VIEW COMPARISON:  Dec 18, 2020 chest radiograph and CT chest FINDINGS: There is persistent airspace opacity in the left upper lobe. There is Makaya Juneau left pleural effusion. There is interstitial prominence bilaterally, likely due to Tara Fields degree of interstitial pulmonary edema. There is cardiomegaly with pulmonary vascularity normal. Patient is status post aortic  valve replacement. There is aortic atherosclerosis. No adenopathy. There is superior migration of the left humeral head. IMPRESSION: Persistent left upper lobe airspace opacity, likely pneumonia. Left pleural effusion. Right pleural effusions seen on recent CT not appreciable by radiography. Suspect mild interstitial pulmonary edema.  Stable cardiomegaly. Status post aortic valve replacement. Aortic Atherosclerosis (ICD10-I70.0). Electronically Signed   By: Lowella Grip III M.D.   On: 12/19/2020 08:47   ECHOCARDIOGRAM COMPLETE  Result Date: 12/20/2020    ECHOCARDIOGRAM REPORT   Patient Name:   Tara Fields Date of Exam: 12/20/2020 Medical Rec #:  [id number]          Height:       60.0 in Accession #:    [id number]         Weight:       147.0 lb Date of Birth:  [birth year]           BSA:          1.638 m Patient Age:    61 years           BP:           152/83 mmHg Patient Gender: F                  HR:           83 bpm. Exam Location:  Inpatient Procedure: 2D Echo Indications:    acute diastolic chf  History:        Patient has prior history of Echocardiogram examinations, most                 recent 09/13/2014.  CHF, COPD, Arrythmias:Atrial Fibrillation;                 Risk Factors:Diabetes, Hypertension and Dyslipidemia.                 Aortic Valve: 26 mm Sapien prosthetic, stented (TAVR) valve is                 present in the aortic position. Procedure Date: 03/07/2019 at                 [facility]  Sonographer:    Tara Fields Referring Phys: [id number] [place]  1. There is prominent septal-lateral left ventricular dyssynchrony due to LBBB. Left ventricular ejection fraction, by estimation, is 25 to 30%. The left ventricle has severely decreased function. The left ventricle demonstrates global hypokinesis. The left ventricular internal cavity size was mildly dilated. Left ventricular diastolic function could not be evaluated.  2. Right ventricular systolic function is normal. The right ventricular size is normal. There is normal pulmonary artery systolic pressure.  3. Left atrial size was severely dilated.  4. The mitral valve is normal in structure. Mild mitral valve regurgitation. No evidence of mitral stenosis.  5. Mild perivalvular leak. The aortic valve has been repaired/replaced. Aortic valve regurgitation is mild. There is Tara Fields 26 mm Sapien prosthetic (TAVR) valve present in the aortic position. Procedure Date: 03/07/2019 at [facility]. Aortic regurgitation PHT measures 401 msec. Aortic valve mean gradient measures 12.2 mmHg. Aortic valve Vmax measures 2.46 m/s. Aortic valve acceleration time measures 76 msec.  6. The inferior vena cava is normal in size with greater than 50% respiratory variability, suggesting right atrial pressure of 3 mmHg. Comparison(s): Tara Fields prior study was performed on 03/25/2020. Prior images unable to be directly viewed, comparison made by report only. The left ventricular function is significantly  worse. 03/25/2020 study at [place] reports LVEF 45-50%, similar TAVR gradients and mild perivalvular leak. LBBB is not mentioned on that report. FINDINGS  Left  Ventricle: There is prominent septal-lateral left ventricular dyssynchrony due to LBBB. Left ventricular ejection fraction, by estimation, is 25 to 30%. The left ventricle has severely decreased function. The left ventricle demonstrates global hypokinesis. The left ventricular internal cavity size was mildly dilated. There is no left ventricular hypertrophy. Abnormal (paradoxical) septal motion, consistent with left bundle branch block. Left ventricular diastolic function could not be evaluated due to atrial fibrillation. Left ventricular diastolic function could not be evaluated. Right Ventricle: The right ventricular size is normal. No increase in right ventricular wall thickness. Right ventricular systolic function is normal. There is normal pulmonary artery systolic pressure. The tricuspid regurgitant velocity is 2.61 m/s, and  with an assumed right atrial pressure of 3 mmHg, the estimated right ventricular systolic pressure is 96.0 mmHg. Left Atrium: Left atrial size was severely dilated. Right Atrium: Right atrial size was normal in size. Pericardium: There is no evidence of pericardial effusion. Mitral Valve: The mitral valve is normal in structure. Mild to moderate mitral annular calcification. Mild mitral valve regurgitation, with centrally-directed jet. No evidence of mitral valve stenosis. Tricuspid Valve: The tricuspid valve is normal in structure. Tricuspid valve regurgitation is mild. Aortic Valve: Mild perivalvular leak. The aortic valve has been repaired/replaced. Aortic valve regurgitation is mild. Aortic regurgitation PHT measures 401 msec. Aortic valve mean gradient measures 12.2 mmHg. Aortic valve peak gradient measures 24.3 mmHg. Aortic valve area, by VTI measures 1.67 cm. There is Ninel Abdella 26 mm Sapien prosthetic, stented (TAVR) valve present in the aortic position. Procedure Date: 03/07/2019 at [facility]. Pulmonic Valve: The pulmonic valve was grossly normal. Pulmonic valve regurgitation is mild.  Aorta: The aortic root and ascending aorta are structurally normal, with no evidence of dilitation. Venous: The inferior vena cava is normal in size with greater than 50% respiratory variability, suggesting right atrial pressure of 3 mmHg. IAS/Shunts: No atrial level shunt detected by color flow Doppler.  LEFT VENTRICLE PLAX 2D LVIDd:         5.70 cm LVIDs:         5.00 cm LV PW:         1.20 cm LV IVS:        1.00 cm LVOT diam:     2.00 cm LV SV:         71 LV SV Index:   44 LVOT Area:     3.14 cm  RIGHT VENTRICLE         IVC TAPSE (M-mode): 1.5 cm  IVC diam: 1.80 cm LEFT ATRIUM             Index       RIGHT ATRIUM           Index LA diam:        5.10 cm 3.11 cm/m  RA Area:     18.00 cm LA Vol (A2C):   96.2 ml 58.73 ml/m RA Volume:   47.70 ml  29.12 ml/m LA Vol (A4C):   77.0 ml 47.01 ml/m LA Biplane Vol: 87.6 ml 53.48 ml/m  AORTIC VALVE AV Area (Vmax):    1.64 cm AV Area (Vmean):   1.70 cm AV Area (VTI):     1.67 cm AV Vmax:           246.47 cm/s AV Vmean:  159.496 cm/s AV VTI:            0.427 m AV Peak Grad:      24.3 mmHg AV Mean Grad:      12.2 mmHg LVOT Vmax:         128.50 cm/s LVOT Vmean:        86.400 cm/s LVOT VTI:          0.227 m LVOT/AV VTI ratio: 0.53 AI PHT:            401 msec  AORTA Ao Root diam: 3.20 cm Ao Asc diam:  3.00 cm TRICUSPID VALVE TR Peak grad:   27.2 mmHg TR Vmax:        261.00 cm/s  SHUNTS Systemic VTI:  0.23 m Systemic Diam: 2.00 cm Dani Gobble Croitoru MD Electronically signed by Sanda Klein MD Signature Date/Time: 12/20/2020/2:15:05 PM    Final         Scheduled Meds: . amLODipine  5 mg Oral Daily  . apixaban  5 mg Oral BID  . aspirin  81 mg Oral Daily  . atorvastatin  40 mg Oral Daily  . cholecalciferol  5,000 Units Oral BID  . fenofibrate  160 mg Oral Daily  . ferrous gluconate  324 mg Oral Q breakfast  . [START ON 12/21/2020] furosemide  20 mg Oral Daily  . losartan  100 mg Oral Daily  . melatonin  3 mg Oral QHS  . metoprolol tartrate  50 mg Oral BID   . potassium chloride  10 mEq Oral Daily  . sodium chloride flush  3 mL Intravenous Q12H  . terazosin  10 mg Oral Q1200  . traZODone  25 mg Oral QHS   Continuous Infusions: . sodium chloride       LOS: 2 days    Time spent: over 30 min    Fayrene Helper, MD [facility]   To contact the attending provider between 7A-7P or the covering provider during after hours 7P-7A, please log into the web site [email or web address] and access using universal [place] password for that web site. If you do not have the password, please call the hospital operator.  12/20/2020, 2:21 PM

## 2020-12-21 DIAGNOSIS — R4189 Other symptoms and signs involving cognitive functions and awareness: Secondary | ICD-10-CM

## 2020-12-21 LAB — COMPREHENSIVE METABOLIC PANEL
ALT: 16 U/L (ref 0–44)
AST: 20 U/L (ref 15–41)
Albumin: 3.3 g/dL — ABNORMAL LOW (ref 3.5–5.0)
Alkaline Phosphatase: 41 U/L (ref 38–126)
Anion gap: 11 (ref 5–15)
BUN: 21 mg/dL (ref 8–23)
CO2: 24 mmol/L (ref 22–32)
Calcium: 9.3 mg/dL (ref 8.9–10.3)
Chloride: 104 mmol/L (ref 98–111)
Creatinine, Ser: 0.83 mg/dL (ref 0.44–1.00)
GFR, Estimated: >60 mL/min (ref >60)
Glucose, Bld: 116 mg/dL — ABNORMAL HIGH (ref 70–99)
Potassium: 3.2 mmol/L — ABNORMAL LOW (ref 3.5–5.1)
Sodium: 139 mmol/L (ref 135–145)
Total Bilirubin: 0.5 mg/dL (ref 0.3–1.2)
Total Protein: 5.9 g/dL — ABNORMAL LOW (ref 6.5–8.1)

## 2020-12-21 LAB — CBC WITH DIFFERENTIAL/PLATELET
Abs Immature Granulocytes: 0.02 10*3/uL (ref 0.00–0.07)
Basophils Absolute: 0 10*3/uL (ref 0.0–0.1)
Basophils Relative: 1 %
Eosinophils Absolute: 0.1 10*3/uL (ref 0.0–0.5)
Eosinophils Relative: 2 %
HCT: 38.5 % (ref 36.0–46.0)
Hemoglobin: 12.5 g/dL (ref 12.0–15.0)
Immature Granulocytes: 0 %
Lymphocytes Relative: 11 %
Lymphs Abs: 0.7 10*3/uL (ref 0.7–4.0)
MCH: 29.3 pg (ref 26.0–34.0)
MCHC: 32.5 g/dL (ref 30.0–36.0)
MCV: 90.4 fL (ref 80.0–100.0)
Monocytes Absolute: 0.7 10*3/uL (ref 0.1–1.0)
Monocytes Relative: 11 %
Neutro Abs: 4.9 10*3/uL (ref 1.7–7.7)
Neutrophils Relative %: 75 %
Platelets: 269 10*3/uL (ref 150–400)
RBC: 4.26 MIL/uL (ref 3.87–5.11)
RDW: 16 % — ABNORMAL HIGH (ref 11.5–15.5)
WBC: 6.5 10*3/uL (ref 4.0–10.5)
nRBC: 0 % (ref 0.0–0.2)

## 2020-12-21 LAB — MAGNESIUM: Magnesium: 1.8 mg/dL (ref 1.7–2.4)

## 2020-12-21 LAB — PHOSPHORUS: Phosphorus: 4.1 mg/dL (ref 2.5–4.6)

## 2020-12-21 MED ORDER — FUROSEMIDE 20 MG PO TABS: 10.0000 mg | ORAL_TABLET | Freq: Every day | ORAL | 0 refills | Status: DC

## 2020-12-21 MED ORDER — POTASSIUM CHLORIDE CRYS ER 20 MEQ PO TBCR
40.0000 meq | EXTENDED_RELEASE_TABLET | Freq: Once | ORAL | Status: AC
  Administered 2020-12-21: 40 meq via ORAL
  Filled 2020-12-21: qty 2

## 2020-12-21 MED ORDER — METOPROLOL SUCCINATE ER 100 MG PO TB24: 100.0000 mg | ORAL_TABLET | Freq: Every day | ORAL | 1 refills | Status: DC

## 2020-12-21 MED ORDER — TRAZODONE HCL 50 MG PO TABS: 25.0000 mg | ORAL_TABLET | Freq: Every day | ORAL | 0 refills | Status: DC

## 2020-12-21 MED ORDER — POTASSIUM CHLORIDE CRYS ER 10 MEQ PO TBCR: 10.0000 meq | EXTENDED_RELEASE_TABLET | Freq: Every day | ORAL | Status: DC

## 2020-12-21 MED ORDER — POTASSIUM CHLORIDE CRYS ER 10 MEQ PO TBCR: 10.0000 meq | EXTENDED_RELEASE_TABLET | Freq: Every day | ORAL | 0 refills | Status: DC

## 2020-12-21 NOTE — Progress Notes (Addendum)
Progress Note  Patient Name: Tara Fields Date of Encounter: 12/21/2020  [facility] Cardiologist: Lauree Chandler, MD   Subjective   Getting restless, wants to walk, aware that she is not supposed to do so.  Inpatient Medications    Scheduled Meds: . apixaban  5 mg Oral BID  . aspirin  81 mg Oral Daily  . atorvastatin  40 mg Oral Daily  . cholecalciferol  5,000 Units Oral BID  . fenofibrate  160 mg Oral Daily  . ferrous gluconate  324 mg Oral Q breakfast  . furosemide  20 mg Oral Daily  . losartan  100 mg Oral Daily  . melatonin  3 mg Oral QHS  . metoprolol tartrate  50 mg Oral BID  . potassium chloride  10 mEq Oral Daily  . sodium chloride flush  3 mL Intravenous Q12H  . terazosin  10 mg Oral Q1200  . traZODone  25 mg Oral QHS   Continuous Infusions: . sodium chloride     PRN Meds: sodium chloride, acetaminophen, antiseptic oral rinse, labetalol, ondansetron (ZOFRAN) IV, sodium chloride flush, traMADol   Vital Signs    Vitals:   12/20/20 1954 12/21/20 0257 12/21/20 0426 12/21/20 0757  BP: (!) 141/72  115/61 (!) 142/82  Pulse: 79  80 90  Resp: 18  18 20   Temp: (!) 97.5 F (36.4 C)  98.2 F (36.8 C)   TempSrc: Oral  Oral   SpO2: 97%  96% 97%  Weight:  65 kg    Height:        Intake/Output Summary (Last 24 hours) at 12/21/2020 0935 Last data filed at 12/21/2020 0650 Gross per 24 hour  Intake 600 ml  Output 1651 ml  Net -1051 ml   Last 3 Weights 12/21/2020 12/20/2020 12/19/2020  Weight (lbs) 143 lb 6.4 oz 147 lb 0.8 oz 151 lb 10.8 oz  Weight (kg) 65.046 kg 66.7 kg 68.8 kg      Telemetry    Atrial fib, PVCs, rate ok - Personally Reviewed  ECG    None today - Personally Reviewed  Physical Exam   GEN: No acute distress.   Neck: No JVD Cardiac: Irreg R&R, soft murmurs, rubs, or gallops.  Respiratory: Clear to auscultation bilaterally. GI: Soft, nontender, non-distended  MS: No edema; No deformity. Neuro:  Nonfocal  Psych: Normal affect    Labs    High Sensitivity Troponin:   Recent Labs  Lab 12/18/20 0825 12/18/20 1000  TROPONINIHS 8 10      Chemistry Recent Labs  Lab 12/18/20 0825 12/19/20 0409 12/20/20 0244 12/21/20 0139  NA 139 139 138 139  K 3.6 3.6 3.5 3.2*  CL 105 105 106 104  CO2 24 25 25 24   GLUCOSE 185* 125* 128* 116*  BUN 16 12 16 21   CREATININE 0.75 0.75 0.73 0.83  CALCIUM 9.1 8.9 8.9 9.3  PROT 6.5  --   --  5.9*  ALBUMIN 3.8  --   --  3.3*  AST 16  --   --  20  ALT 13  --   --  16  ALKPHOS 41  --   --  41  BILITOT 0.9  --   --  0.5  GFRNONAA >60 >60 >60 >60  ANIONGAP 10 9 7 11      Hematology Recent Labs  Lab 12/18/20 0825 12/21/20 0139  WBC 6.4 6.5  RBC 4.24 4.26  HGB 12.2 12.5  HCT 38.8 38.5  MCV 91.5 90.4  MCH  28.8 29.3  MCHC 31.4 32.5  RDW 15.9* 16.0*  PLT 271 269    BNP Recent Labs  Lab 12/18/20 0825  BNP 1,444.7*   No results found for: TSH Lab Results  Component Value Date   HGBA1C 5.9 (H) 09/02/2020   No results found for: CHOL, HDL, LDLCALC, LDLDIRECT, TRIG, CHOLHDL  DDimer No results for input(s): DDIMER in the last 168 hours.   Radiology    ECHOCARDIOGRAM COMPLETE  Result Date: 12/20/2020    ECHOCARDIOGRAM REPORT   Patient Name:   Tara Fields Date of Exam: 12/20/2020 Medical Rec #:  [id number]          Height:       60.0 in Accession #:    [id number]         Weight:       147.0 lb Date of Birth:  11-12-1927           BSA:          1.638 m Patient Age:    87 years           BP:           152/83 mmHg Patient Gender: F                  HR:           83 bpm. Exam Location:  Inpatient Procedure: 2D Echo Indications:    acute diastolic chf  History:        Patient has prior history of Echocardiogram examinations, most                 recent 09/13/2014. CHF, COPD, Arrythmias:Atrial Fibrillation;                 Risk Factors:Diabetes, Hypertension and Dyslipidemia.                 Aortic Valve: 26 mm Sapien prosthetic, stented (TAVR) valve is                  present in the aortic position. Procedure Date: 03/07/2019 at                 [facility].  Sonographer:    Johny Chess Referring Phys: [id number] [place]  1. There is prominent septal-lateral left ventricular dyssynchrony due to LBBB. Left ventricular ejection fraction, by estimation, is 25 to 30%. The left ventricle has severely decreased function. The left ventricle demonstrates global hypokinesis. The left ventricular internal cavity size was mildly dilated. Left ventricular diastolic function could not be evaluated.  2. Right ventricular systolic function is normal. The right ventricular size is normal. There is normal pulmonary artery systolic pressure.  3. Left atrial size was severely dilated.  4. The mitral valve is normal in structure. Mild mitral valve regurgitation. No evidence of mitral stenosis.  5. Mild perivalvular leak. The aortic valve has been repaired/replaced. Aortic valve regurgitation is mild. There is a 26 mm Sapien prosthetic (TAVR) valve present in the aortic position. Procedure Date: 03/07/2019 at [facility]. Aortic regurgitation PHT measures 401 msec. Aortic valve mean gradient measures 12.2 mmHg. Aortic valve Vmax measures 2.46 m/s. Aortic valve acceleration time measures 76 msec.  6. The inferior vena cava is normal in size with greater than 50% respiratory variability, suggesting right atrial pressure of 3 mmHg. Comparison(s): A prior study was performed on 03/25/2020. Prior images unable to be directly viewed, comparison made by report only. The left ventricular  function is significantly worse. 03/25/2020 study at [place] reports LVEF 45-50%, similar TAVR gradients and mild perivalvular leak. LBBB is not mentioned on that report. FINDINGS  Left Ventricle: There is prominent septal-lateral left ventricular dyssynchrony due to LBBB. Left ventricular ejection fraction, by estimation, is 25 to 30%. The left ventricle has severely decreased function. The  left ventricle demonstrates global hypokinesis. The left ventricular internal cavity size was mildly dilated. There is no left ventricular hypertrophy. Abnormal (paradoxical) septal motion, consistent with left bundle branch block. Left ventricular diastolic function could not be evaluated due to atrial fibrillation. Left ventricular diastolic function could not be evaluated. Right Ventricle: The right ventricular size is normal. No increase in right ventricular wall thickness. Right ventricular systolic function is normal. There is normal pulmonary artery systolic pressure. The tricuspid regurgitant velocity is 2.61 m/s, and  with an assumed right atrial pressure of 3 mmHg, the estimated right ventricular systolic pressure is 64.4 mmHg. Left Atrium: Left atrial size was severely dilated. Right Atrium: Right atrial size was normal in size. Pericardium: There is no evidence of pericardial effusion. Mitral Valve: The mitral valve is normal in structure. Mild to moderate mitral annular calcification. Mild mitral valve regurgitation, with centrally-directed jet. No evidence of mitral valve stenosis. Tricuspid Valve: The tricuspid valve is normal in structure. Tricuspid valve regurgitation is mild. Aortic Valve: Mild perivalvular leak. The aortic valve has been repaired/replaced. Aortic valve regurgitation is mild. Aortic regurgitation PHT measures 401 msec. Aortic valve mean gradient measures 12.2 mmHg. Aortic valve peak gradient measures 24.3 mmHg. Aortic valve area, by VTI measures 1.67 cm. There is a 26 mm Sapien prosthetic, stented (TAVR) valve present in the aortic position. Procedure Date: 03/07/2019 at [facility]. Pulmonic Valve: The pulmonic valve was grossly normal. Pulmonic valve regurgitation is mild. Aorta: The aortic root and ascending aorta are structurally normal, with no evidence of dilitation. Venous: The inferior vena cava is normal in size with greater than 50% respiratory variability, suggesting  right atrial pressure of 3 mmHg. IAS/Shunts: No atrial level shunt detected by color flow Doppler.  LEFT VENTRICLE PLAX 2D LVIDd:         5.70 cm LVIDs:         5.00 cm LV PW:         1.20 cm LV IVS:        1.00 cm LVOT diam:     2.00 cm LV SV:         71 LV SV Index:   44 LVOT Area:     3.14 cm  RIGHT VENTRICLE         IVC TAPSE (M-mode): 1.5 cm  IVC diam: 1.80 cm LEFT ATRIUM             Index       RIGHT ATRIUM           Index LA diam:        5.10 cm 3.11 cm/m  RA Area:     18.00 cm LA Vol (A2C):   96.2 ml 58.73 ml/m RA Volume:   47.70 ml  29.12 ml/m LA Vol (A4C):   77.0 ml 47.01 ml/m LA Biplane Vol: 87.6 ml 53.48 ml/m  AORTIC VALVE AV Area (Vmax):    1.64 cm AV Area (Vmean):   1.70 cm AV Area (VTI):     1.67 cm AV Vmax:           246.47 cm/s AV Vmean:  159.496 cm/s AV VTI:            0.427 m AV Peak Grad:      24.3 mmHg AV Mean Grad:      12.2 mmHg LVOT Vmax:         128.50 cm/s LVOT Vmean:        86.400 cm/s LVOT VTI:          0.227 m LVOT/AV VTI ratio: 0.53 AI PHT:            401 msec  AORTA Ao Root diam: 3.20 cm Ao Asc diam:  3.00 cm TRICUSPID VALVE TR Peak grad:   27.2 mmHg TR Vmax:        261.00 cm/s  SHUNTS Systemic VTI:  0.23 m Systemic Diam: 2.00 cm Sanda Klein MD Electronically signed by Sanda Klein MD Signature Date/Time: 12/20/2020/2:15:05 PM    Final     Cardiac Studies   ECHO:  12/20/2020 1. There is prominent septal-lateral left ventricular dyssynchrony due to  LBBB. Left ventricular ejection fraction, by estimation, is 25 to 30%. The  left ventricle has severely decreased function. The left ventricle  demonstrates global hypokinesis. The left ventricular internal cavity size was mildly dilated. Left ventricular diastolic function could not be evaluated.  2. Right ventricular systolic function is normal. The right ventricular  size is normal. There is normal pulmonary artery systolic pressure.  3. Left atrial size was severely dilated.  4. The mitral valve is  normal in structure. Mild mitral valve  regurgitation. No evidence of mitral stenosis.  5. Mild perivalvular leak. The aortic valve has been repaired/replaced.  Aortic valve regurgitation is mild. There is a 26 mm Sapien prosthetic  (TAVR) valve present in the aortic position. Procedure Date: 03/07/2019 at  [facility]. Aortic regurgitation  PHT measures 401 msec. Aortic valve mean gradient measures 12.2 mmHg.  Aortic valve Vmax measures 2.46 m/s. Aortic valve acceleration time  measures 76 msec.  6. The inferior vena cava is normal in size with greater than 50%  respiratory variability, suggesting right atrial pressure of 3 mmHg.   Patient Profile     87 y.o. female with a hx of severe AS s/p TAVR at [facility], COPD, CAD, GERD,persistentatrial fibrillation, DM, HTN, HLD, bilat carotid artery occlusion s/p stent R-ICA, tobacco abuse and diverticulitis, was admitted 12/18/2020 with SOB, Cards saw 12/20/2020 for the evaluation of CHF w/ decreased EF.  Assessment & Plan    1. Acute on chronic systolic CHF - wt down > 8 lbs since admit, resp much improved - EF 25-30% w/ nl PAS on echo  - med mgt preferred by pt and son, no ischemic eval planned - now on Lasix 20 mg qd, will supp K+, will also add 10 meq qd - renal function stable - continue losartan 100 mg qd - MD advise on changing lopressor to Toprol XL 100 mg qd - will send message for f/u appt  2. HTN - amlodipine d/c'd -  SBP 100- 142 last 24 hr - follow  Otherwise, per IM Active Problems:   Acute exacerbation of CHF (congestive heart failure) (HCC)   CHF (congestive heart failure) (HCC)   Pressure injury of skin  For questions or updates, please contact [facility] Please consult [email or web address] for contact info under      Signed, Rosaria Ferries, PA-C  12/21/2020, 9:35 AM    History and all data above reviewed.  Patient examined.  I agree with the findings as above.  She denies SOB or chest pain.   The patient exam  reveals COR:RRR with ectoyp  ,  Lungs:   Clear  ,  Abd: Positive bowel sounds, no rebound no guarding, Ext No edema  .  All available labs, radiology testing, previous records reviewed. Agree with documented assessment and plan.   Acute systolic HF:  EF reduced.  I think she will need some diuretic.  We talked about need to reduce salt if it is in the food at her retirement home.  I would suggest Lasix 10 mg daily and potassium 10 mg daily. Continue current beta blocker.  As an out patient we can consider titrating this based on BPs.     Jeneen Rinks [facility]  11:36 AM  12/21/2020

## 2020-12-21 NOTE — Discharge Summary (Signed)
Physician Discharge Summary  Tara Fields ZOX:[id number] DOB: Jan 04, 1928 DOA: 12/18/2020  PCP: [name], NP  Admit date: 12/18/2020 Discharge date: 12/21/2020  Time spent: 40 minutes  Recommendations for Outpatient Follow-up:  1. Follow outpatient CBC/CMP 2. Follow volume status/lytes/creatinine outpatient on lasix 3. Follow repeat CXR outpatient 4. Follow with cardiology outpatient 5. Follow with neurology outpatient, concern for cognitive deficit 6. Follow response to trazodone   Discharge Diagnoses:  Active Problems:   Acute exacerbation of CHF (congestive heart failure) (HCC)   CHF (congestive heart failure) (HCC)   Pressure injury of skin   Discharge Condition: stable  Diet recommendation: heart healthy  Filed Weights   12/19/20 0514 12/20/20 0410 12/21/20 0257  Weight: 68.8 kg 66.7 kg 65 kg    History of present illness:  Tara Fields 87 y.o.femalewith medical history significant ofchronic systolic CHF(LVEF 45 to 81% in 2020 at [facility]), HTN, aortic stenosis s/p TAVR 2018, paroxysmal Tara Fields. fib on Eliquis, presented with increasing shortness of breath, chest pain and leg swelling.  She's been diuresed and has improved from as respiratory status.  EF has worsened, cardiology has been consulted and is recommending outpatient follow up.    See below for additional details  Hospital Course:  Acute hypoxic respiratory failure secondary to acute on chronic HFrEF and Tara Fields. fib with RVR. - Currently on RA today - CXR 5/6 with persistent L upper lobe opacity, ? Pneumonia.  Mild interstitial pulm edema. - Fields 5/5 with CHF diffuse bilateral interstitial thickening and small bilateral pleural effusions, lung opacity extending superiorly from L hilum c/w post radiation scarring - appears euvolemic, transition to PO lasix - echo with worsened EF -> now 25-30%, global hypokinesis, mild perivalvular leak, 26 mm sapien prosthetic valve in aortic position (see  report) - given worsened EF, will c/s cardiology -> d/c with 10 mg lasix and 10 meq kcl - follow outpatient with cardiology - Continue losartan, metoprolol -> defer to cardiology outpatient further adjustment -Strict I/O, daily weights  Chronic atrial fibrillation.Rate controlled with increase in metoprolol. -Continue metoprolol and Eliquis.  Left lower lobe lung cancer s/p radiation.Lung scarring secondary to radiation seems stable on Fields scan. -Continue outpatient pulmonology and oncology follow-up  Hx AS s/p TAVR 2018. - echo as noted above, mild perivalvular leak, 26 mm sapien prosthetic valve in the aortic position (see report) - appreciate cardiology recs, follow outpatient  Hypertension. Blood pressure within goal. -Continue amlodipine, losartan and Lasix  Chest pain resolved.Troponin remain negative.  Delirium  Concern for Cognitive Deficit vs Dementia  Insomnia Delirium precautions Trazodone for sleep Neurology follow outpatient  Suicidal Ideation See previous notes, continues to deny SI today.  This was in setting of delirium.  Follow outpatient as indicated.  Procedures: Echo IMPRESSIONS    1. There is prominent septal-lateral left ventricular dyssynchrony due to  LBBB. Left ventricular ejection fraction, by estimation, is 25 to 30%. The  left ventricle has severely decreased function. The left ventricle  demonstrates global hypokinesis. The  left ventricular internal cavity size was mildly dilated. Left ventricular  diastolic function could not be evaluated.  2. Right ventricular systolic function is normal. The right ventricular  size is normal. There is normal pulmonary artery systolic pressure.  3. Left atrial size was severely dilated.  4. The mitral valve is normal in structure. Mild mitral valve  regurgitation. No evidence of mitral stenosis.  5. Mild perivalvular leak. The aortic valve has been repaired/replaced.  Aortic valve  regurgitation is mild. There  is Estell Dillinger 26 mm Sapien prosthetic  (TAVR) valve present in the aortic position. Procedure Date: 03/07/2019 at  [facility]. Aortic regurgitation  PHT measures 401 msec. Aortic valve mean gradient measures 12.2 mmHg.  Aortic valve Vmax measures 2.46 m/s. Aortic valve acceleration time  measures 76 msec.  6. The inferior vena cava is normal in size with greater than 50%  respiratory variability, suggesting right atrial pressure of 3 mmHg.   Comparison(s): Christoffer Currier prior study was performed on 03/25/2020. Prior images  unable to be directly viewed, comparison made by report only. The left  ventricular function is significantly worse. 03/25/2020 study at [place] reports LVEF 45-50%,  similar TAVR gradients and mild perivalvular leak. LBBB is not mentioned  on that report.   Consultations:  cardiology  Discharge Exam: Vitals:   12/21/20 0426 12/21/20 0757  BP: 115/61 (!) 142/82  Pulse: 80 90  Resp: 18 20  Temp: 98.2 F (36.8 C)   SpO2: 96% 97%   No new complaints Denies SI Called and discussed d/c plan of care with son  General: No acute distress. Cardiovascular: Heart sounds show Dominick Zertuche regular rate, and rhythm. Lungs: Clear to auscultation bilaterally Abdomen: Soft, nontender, nondistended  Neurological: Alert and oriented 3. Moves all extremities 4 . Cranial nerves II through XII grossly intact. Skin: Warm and dry. No rashes or lesions. Extremities: No clubbing or cyanosis. No edema  Discharge Instructions   Discharge Instructions    (HEART FAILURE PATIENTS) Call MD:  Anytime you have any of the following symptoms: 1) 3 pound weight gain in 24 hours or 5 pounds in 1 week 2) shortness of breath, with or without Tara Fields dry hacking cough 3) swelling in the hands, feet or stomach 4) if you have to sleep on extra pillows at night in order to breathe.   Complete by: As directed    (HEART FAILURE PATIENTS) Call MD:  Anytime you have any of the following  symptoms: 1) 3 pound weight gain in 24 hours or 5 pounds in 1 week 2) shortness of breath, with or without Tara Fields dry hacking cough 3) swelling in the hands, feet or stomach 4) if you have to sleep on extra pillows at night in order to breathe.   Complete by: As directed    Ambulatory referral to Neurology   Complete by: As directed    An appointment is requested in approximately: 2 weeks   Call MD for:  difficulty breathing, headache or visual disturbances   Complete by: As directed    Call MD for:  extreme fatigue   Complete by: As directed    Call MD for:  hives   Complete by: As directed    Call MD for:  persistant dizziness or light-headedness   Complete by: As directed    Call MD for:  persistant nausea and vomiting   Complete by: As directed    Call MD for:  redness, tenderness, or signs of infection (pain, swelling, redness, odor or green/yellow discharge around incision site)   Complete by: As directed    Call MD for:  severe uncontrolled pain   Complete by: As directed    Call MD for:  temperature >100.4   Complete by: As directed    Diet - low sodium heart healthy   Complete by: As directed    Diet - low sodium heart healthy   Complete by: As directed    Discharge instructions   Complete by: As directed  You were seen for Genova Kiner heart failure exacerbation.  You've improved with diuresis.  Your ejection fraction (pump) of your heart is down from the last time it was checked.  We had cardiology see you while you were here.  We'll continue your losartan and metoprolol.  We'll start you on lasix and potassium.  Please follow up repeat labs with your PCP within about Ras Kollman week.  Follow up with cardiology outpatient as well.    We'll start you on trazodone for sleep.  Continue to follow up with pulmonology and oncology outpatient for you lung cancer follow up.  Follow up outpatient with cardiology.  Follow up with neurology for question of possible dementia.  Return for new,  recurrent, or worsening symptoms.  Please ask your PCP to request records from this hospitalization so they know what was done and what the next steps will be.   Discharge wound care:   Complete by: As directed    Continue to follow your redness on your bottom with your primary doctor.   Heart Failure patients record your daily weight using the same scale at the same time of day   Complete by: As directed    Increase activity slowly   Complete by: As directed    Increase activity slowly   Complete by: As directed      Allergies as of 12/21/2020      Reactions   Amlodipine Besy-benazepril Hcl Cough   Hctz [hydrochlorothiazide] Other (See Comments)   Per family, every diuretic the patient has been on flushes out her out and ultimately results in CRAMPING/JAUNDICE (added potassium might help)   Other Other (See Comments)   History of diverticulitis- CANNOT TOLERATE NUTS, CORN, AND ANY FOODS NOT EASILY DIGESTED- the patient AVOIDS these   Cyclobenzaprine Other (See Comments)   Confusion- does not tolerate well    Diclofenac Sodium Diarrhea   Hydralazine Other (See Comments)   Per family, every diuretic the patient has been on flushes out her out and ultimately results in CRAMPING/JAUNDICE (added potassium might help)   Methylprednisolone Other (See Comments)   Medrol/CORTICOSTEROIDS = Cathern Tahir LOT OF ENERGY   Oxycodone Other (See Comments)   Caused patient to "act crazy"      Medication List    STOP taking these medications   amLODipine 5 MG tablet Commonly known as: NORVASC   potassium chloride 10 MEQ tablet Commonly known as: KLOR-CON Replaced by: potassium chloride 10 MEQ tablet     TAKE these medications   acetaminophen 500 MG tablet Commonly known as: TYLENOL Take 500 mg by mouth every 6 (six) hours as needed for mild pain (or headaches).   aspirin 81 MG chewable tablet Chew 1 tablet (81 mg total) by mouth daily.   atorvastatin 40 MG tablet Commonly known as: LIPITOR TAKE  1 TABLET BY MOUTH DAILY AT 6 PM. What changed: when to take this   Centrum Silver 50+Women Tabs Take 1 tablet by mouth daily with breakfast.   Co Q-10 300 MG Caps Take 300 mg by mouth in the morning.   Eliquis 5 MG Tabs tablet Generic drug: apixaban TAKE 1 TABLET BY MOUTH TWICE Jayquan Bradsher DAY What changed:   how much to take  when to take this  additional instructions   fenofibrate 160 MG tablet TAKE 1 TABLET BY MOUTH EVERY DAY What changed: when to take this   ferrous gluconate 324 MG tablet Commonly known as: FERGON Take 1 tablet (324 mg total) by mouth daily with breakfast.  Fish Oil 1200 MG Caps Take 1,200 mg by mouth in the morning.   furosemide 20 MG tablet Commonly known as: LASIX Take 0.5 tablets (10 mg total) by mouth daily. Start taking on: Dec 22, 2020   losartan 100 MG tablet Commonly known as: COZAAR TAKE 1 TABLET BY MOUTH EVERY DAY What changed: when to take this   metoprolol tartrate 50 MG tablet Commonly known as: LOPRESSOR TAKE 1 TABLET BY MOUTH TWICE Laurynn Mccorvey DAY What changed:   when to take this  additional instructions   NASAL SPRAY SALINE NA Place 1 spray into both nostrils as needed (for congestion).   potassium chloride 10 MEQ tablet Commonly known as: KLOR-CON Take 1 tablet (10 mEq total) by mouth daily. Start taking on: Dec 22, 2020 Replaces: potassium chloride 10 MEQ tablet   psyllium 58.6 % powder Commonly known as: METAMUCIL Take 1 packet by mouth daily as needed (for constipation).   terazosin 10 MG capsule Commonly known as: HYTRIN Take 1 capsule (10 mg total) by mouth daily at 12 noon. What changed: when to take this   traMADol 50 MG tablet Commonly known as: ULTRAM TAKE 1 TABLET (50 MG TOTAL) BY MOUTH IN THE MORNING, AT NOON, AND AT BEDTIME. What changed: See the new instructions.   traZODone 50 MG tablet Commonly known as: DESYREL Take 0.5 tablets (25 mg total) by mouth at bedtime.   VITAMIN D-3 PO Take 1,000 Units by mouth  2 (two) times daily.            Discharge Care Instructions  (From admission, onward)         Start     Ordered   12/21/20 0000  Discharge wound care:       Comments: Continue to follow your redness on your bottom with your primary doctor.   12/21/20 1213         Allergies  Allergen Reactions  . Amlodipine Besy-Benazepril Hcl Cough  . Hctz [Hydrochlorothiazide] Other (See Comments)    Per family, every diuretic the patient has been on flushes out her out and ultimately results in CRAMPING/JAUNDICE (added potassium might help)  . Other Other (See Comments)    History of diverticulitis- CANNOT TOLERATE NUTS, CORN, AND ANY FOODS NOT EASILY DIGESTED- the patient AVOIDS these  . Cyclobenzaprine Other (See Comments)    Confusion- does not tolerate well   . Diclofenac Sodium Diarrhea  . Hydralazine Other (See Comments)    Per family, every diuretic the patient has been on flushes out her out and ultimately results in CRAMPING/JAUNDICE (added potassium might help)  . Methylprednisolone Other (See Comments)    Medrol/CORTICOSTEROIDS = Alexina Niccoli LOT OF ENERGY  . Oxycodone Other (See Comments)    Caused patient to "act crazy"      The results of significant diagnostics from this hospitalization (including imaging, microbiology, ancillary and laboratory) are listed below for reference.    Significant Diagnostic Studies: Fields Angio Chest PE W/Cm &/Or Wo Cm  Result Date: 12/18/2020 CLINICAL DATA:  Right AND Left upper quadrant pain since Tuesday and nausea. Increase SOB. History of radiation therapy left lung carcinoma. EXAM: Fields ANGIOGRAPHY CHEST Fields ABDOMEN AND PELVIS WITH CONTRAST TECHNIQUE: Multidetector Fields imaging of the chest was performed using the standard protocol during bolus administration of intravenous contrast. Multiplanar Fields image reconstructions and MIPs were obtained to evaluate the vascular anatomy. Multidetector Fields imaging of the abdomen and pelvis was performed using the  standard protocol during bolus administration of intravenous contrast.  CONTRAST:  17mL OMNIPAQUE IOHEXOL 350 MG/ML SOLN COMPARISON:  09/01/2020 FINDINGS: CTA CHEST FINDINGS Cardiovascular: Pulmonary arteries are well opacified. There is no evidence of Tara Fields pulmonary embolism. Heart is mildly enlarged. Stable changes from previous aortic valve replacement. Three-vessel coronary artery calcifications. No pericardial effusion. Aorta is unopacified. Are aortic and arch branch vessel atherosclerotic calcifications. Mediastinum/Nodes: Normal thyroid. No neck base or axillary masses or enlarged lymph nodes. Mildly enlarged right paratracheal, azygos level lymph node measuring 1.4 cm in short axis. No mediastinal or hilar masses. No other enlarged lymph nodes. Trachea and esophagus are unremarkable. Lungs/Pleura: Bilateral interstitial thickening. Consolidation associated with bronchiectasis extends from the hilum into the left upper lobe. Small areas of opacity are noted at the lung bases consistent with atelectasis. Small bilateral pleural effusions. No pneumothorax. Musculoskeletal: Old right rib fractures. No acute fracture. No bone lesion. No chest wall masses. Review of the MIP images confirms the above findings. Fields ABDOMEN and PELVIS FINDINGS Hepatobiliary: Liver normal in size and attenuation. No masses. Gallbladder surgically absent. Common bile duct dilated to Tara Fields maximum 1.8 cm with distal tapering, unchanged. No evidence of Tara Fields duct stone. Pancreas: Unremarkable. No pancreatic ductal dilatation or surrounding inflammatory changes. Spleen: Normal in size without focal abnormality. Adrenals/Urinary Tract: 2.1 cm left adrenal mass, unchanged from the previous Fields scan and stable from Tara Fields 12/27/2019. Normal right adrenal gland Kidneys normal in size, orientation and position. Symmetric renal enhancement and excretion. Two low-density left renal masses, largest, 1.9 cm, Tara Fields lower pole renal sinus mass, both consistent  with cysts and both stable. No other masses, no stones and no hydronephrosis. Normal ureters. Normal bladder. Stomach/Bowel: Normal stomach. Small bowel and colon are normal in caliber. No wall thickening. No inflammation. Numerous colonic diverticula most evident along the sigmoid. Mild generalized increase in the colonic stool burden. Vascular/Lymphatic: Dense aortic atherosclerosis. No aneurysm. No enlarged lymph nodes. Reproductive: Uterus and bilateral adnexa are unremarkable. Other: Small midline hernia contains Tara Fields knuckle of the transverse colon, unchanged from the prior study. No ascites. Musculoskeletal: No fracture. No bone lesion. Previous L4 through S1 posterior lumbar spine fusion, stable from the prior exam. Review of the MIP images confirms the above findings. IMPRESSION: CHEST CTA 1. No evidence of Seniyah Esker pulmonary embolism. 2. Congestive heart failure diffuse bilateral interstitial thickening and small bilateral pleural effusions. 3. Left lung opacity extending superiorly from the left hilum consistent with post radiation scarring, stable from the prior Fields. 4. Mild dependent atelectasis. 5. Changes from aortic valve replacement. Coronary artery calcifications and aortic atherosclerosis. ABDOMEN AND PELVIS Fields 1. No acute findings within the abdomen or pelvis. 2. Colonic diverticulosis without evidence diverticulitis. Mild increase in the colonic stool burden. 3. Stable small midline paraumbilical hernia. 4. Aortic atherosclerosis. Electronically Signed   By: Lajean Manes M.D.   On: 12/18/2020 11:11   Fields Abdomen Pelvis W Contrast  Result Date: 12/18/2020 CLINICAL DATA:  Right AND Left upper quadrant pain since Tuesday and nausea. Increase SOB. History of radiation therapy left lung carcinoma. EXAM: Fields ANGIOGRAPHY CHEST Fields ABDOMEN AND PELVIS WITH CONTRAST TECHNIQUE: Multidetector Fields imaging of the chest was performed using the standard protocol during bolus administration of intravenous contrast.  Multiplanar Fields image reconstructions and MIPs were obtained to evaluate the vascular anatomy. Multidetector Fields imaging of the abdomen and pelvis was performed using the standard protocol during bolus administration of intravenous contrast. CONTRAST:  5mL OMNIPAQUE IOHEXOL 350 MG/ML SOLN COMPARISON:  09/01/2020 FINDINGS: CTA CHEST FINDINGS Cardiovascular: Pulmonary arteries  are well opacified. There is no evidence of Greely Atiyeh pulmonary embolism. Heart is mildly enlarged. Stable changes from previous aortic valve replacement. Three-vessel coronary artery calcifications. No pericardial effusion. Aorta is unopacified. Are aortic and arch branch vessel atherosclerotic calcifications. Mediastinum/Nodes: Normal thyroid. No neck base or axillary masses or enlarged lymph nodes. Mildly enlarged right paratracheal, azygos level lymph node measuring 1.4 cm in short axis. No mediastinal or hilar masses. No other enlarged lymph nodes. Trachea and esophagus are unremarkable. Lungs/Pleura: Bilateral interstitial thickening. Consolidation associated with bronchiectasis extends from the hilum into the left upper lobe. Small areas of opacity are noted at the lung bases consistent with atelectasis. Small bilateral pleural effusions. No pneumothorax. Musculoskeletal: Old right rib fractures. No acute fracture. No bone lesion. No chest wall masses. Review of the MIP images confirms the above findings. Fields ABDOMEN and PELVIS FINDINGS Hepatobiliary: Liver normal in size and attenuation. No masses. Gallbladder surgically absent. Common bile duct dilated to Rhylie Stehr maximum 1.8 cm with distal tapering, unchanged. No evidence of Gabrella Stroh duct stone. Pancreas: Unremarkable. No pancreatic ductal dilatation or surrounding inflammatory changes. Spleen: Normal in size without focal abnormality. Adrenals/Urinary Tract: 2.1 cm left adrenal mass, unchanged from the previous Fields scan and stable from Jermon Chalfant chest Fields 12/27/2019. Normal right adrenal gland Kidneys normal in size,  orientation and position. Symmetric renal enhancement and excretion. Two low-density left renal masses, largest, 1.9 cm, Kristol Almanzar lower pole renal sinus mass, both consistent with cysts and both stable. No other masses, no stones and no hydronephrosis. Normal ureters. Normal bladder. Stomach/Bowel: Normal stomach. Small bowel and colon are normal in caliber. No wall thickening. No inflammation. Numerous colonic diverticula most evident along the sigmoid. Mild generalized increase in the colonic stool burden. Vascular/Lymphatic: Dense aortic atherosclerosis. No aneurysm. No enlarged lymph nodes. Reproductive: Uterus and bilateral adnexa are unremarkable. Other: Small midline hernia contains Jamilya Sarrazin knuckle of the transverse colon, unchanged from the prior study. No ascites. Musculoskeletal: No fracture. No bone lesion. Previous L4 through S1 posterior lumbar spine fusion, stable from the prior exam. Review of the MIP images confirms the above findings. IMPRESSION: CHEST CTA 1. No evidence of Kemya Shed pulmonary embolism. 2. Congestive heart failure diffuse bilateral interstitial thickening and small bilateral pleural effusions. 3. Left lung opacity extending superiorly from the left hilum consistent with post radiation scarring, stable from the prior Fields. 4. Mild dependent atelectasis. 5. Changes from aortic valve replacement. Coronary artery calcifications and aortic atherosclerosis. ABDOMEN AND PELVIS Fields 1. No acute findings within the abdomen or pelvis. 2. Colonic diverticulosis without evidence diverticulitis. Mild increase in the colonic stool burden. 3. Stable small midline paraumbilical hernia. 4. Aortic atherosclerosis. Electronically Signed   By: Lajean Manes M.D.   On: 12/18/2020 11:11   DG Chest Port 1 View  Result Date: 12/19/2020 CLINICAL DATA:  Chest pain EXAM: PORTABLE CHEST 1 VIEW COMPARISON:  Dec 18, 2020 chest radiograph and Fields chest FINDINGS: There is persistent airspace opacity in the left upper lobe. There is Tara Fields  left pleural effusion. There is interstitial prominence bilaterally, likely due to Tara Fields degree of interstitial pulmonary edema. There is cardiomegaly with pulmonary vascularity normal. Patient is status post aortic valve replacement. There is aortic atherosclerosis. No adenopathy. There is superior migration of the left humeral head. IMPRESSION: Persistent left upper lobe airspace opacity, likely pneumonia. Left pleural effusion. Right pleural effusions seen on recent Fields not appreciable by radiography. Suspect mild interstitial pulmonary edema.  Stable cardiomegaly. Status post aortic valve replacement. Aortic Atherosclerosis (ICD10-I70.0). Electronically  Signed   By: Lowella Grip III M.D.   On: 12/19/2020 08:47   DG Chest Port 1 View  Result Date: 12/18/2020 CLINICAL DATA:  Chest pain. Possible nipple shadow at the right lung base on Labrina Lines portable chest earlier today. EXAM: PORTABLE CHEST 1 VIEW COMPARISON:  Earlier today. FINDINGS: Stable enlarged cardiac silhouette. Mild increase in prominence of the interstitial markings. Linear scarring in the left upper lobe is again noted. Stable small left pleural effusion. The nodular density seen at the right lung base previously is no longer visualized. The patient's nipples are more inferiorly located and not included on the image due to their low position. Thoracic and cervical spine degenerative changes. Moderate bilateral glenohumeral joint degenerative changes. IMPRESSION: 1. No nipple shadow or right lung nodule. 2. Mild interstitial pulmonary edema with progression. 3. Stable cardiomegaly and small left pleural effusion. Electronically Signed   By: Claudie Revering M.D.   On: 12/18/2020 09:00   DG Chest Port 1 View  Result Date: 12/18/2020 CLINICAL DATA:  Chest pain EXAM: PORTABLE CHEST 1 VIEW COMPARISON:  November 10, 2020.  September 25, 2020 FINDINGS: Persistent scarring noted in the left upper lobe. There is diffuse interstitial thickening, stable. There is Tara Fields Purewal  minimal left pleural effusion. Opacity right base region potentially may represent overlying nipple shadow. Heart is mildly enlarged with pulmonary vascularity within normal limits. Patient is status post aortic valve replacement. There is aortic atherosclerosis. Bones are osteoporotic. There is degenerative change in the shoulders with Katia Hannen degree of superior migration of each humeral head. IMPRESSION: Scarring left upper lobe, stable. Interstitial thickening throughout the lungs likely represents Tara Fields degree of chronic bronchitis. Small left pleural effusion. Question nipple shadow right base; repeat study with nipple markers advised to exclude underlying developing infiltrate in this area. Cardiac prominence, stable. Status post aortic valve replacement. Aortic Atherosclerosis (ICD10-I70.0). Electronically Signed   By: Lowella Grip III M.D.   On: 12/18/2020 08:36   ECHOCARDIOGRAM COMPLETE  Result Date: 12/20/2020    ECHOCARDIOGRAM REPORT   Patient Name:   ABYGALE KARPF Date of Exam: 12/20/2020 Medical Rec #:  [id number]          Height:       60.0 in Accession #:    [id number]         Weight:       147.0 lb Date of Birth:  Feb 21, 1928           BSA:          1.638 m Patient Age:    53 years           BP:           152/83 mmHg Patient Gender: F                  HR:           83 bpm. Exam Location:  Inpatient Procedure: 2D Echo Indications:    acute diastolic chf  History:        Patient has prior history of Echocardiogram examinations, most                 recent 09/13/2014. CHF, COPD, Arrythmias:Atrial Fibrillation;                 Risk Factors:Diabetes, Hypertension and Dyslipidemia.                 Aortic Valve: 26 mm Sapien prosthetic, stented (TAVR) valve is  present in the aortic position. Procedure Date: 03/07/2019 at                 [facility].  Sonographer:    Johny Chess Referring Phys: [id number] [place]  1. There is prominent septal-lateral left ventricular  dyssynchrony due to LBBB. Left ventricular ejection fraction, by estimation, is 25 to 30%. The left ventricle has severely decreased function. The left ventricle demonstrates global hypokinesis. The left ventricular internal cavity size was mildly dilated. Left ventricular diastolic function could not be evaluated.  2. Right ventricular systolic function is normal. The right ventricular size is normal. There is normal pulmonary artery systolic pressure.  3. Left atrial size was severely dilated.  4. The mitral valve is normal in structure. Mild mitral valve regurgitation. No evidence of mitral stenosis.  5. Mild perivalvular leak. The aortic valve has been repaired/replaced. Aortic valve regurgitation is mild. There is Gareld Obrecht 26 mm Sapien prosthetic (TAVR) valve present in the aortic position. Procedure Date: 03/07/2019 at [facility] Aortic regurgitation PHT measures 401 msec. Aortic valve mean gradient measures 12.2 mmHg. Aortic valve Vmax measures 2.46 m/s. Aortic valve acceleration time measures 76 msec.  6. The inferior vena cava is normal in size with greater than 50% respiratory variability, suggesting right atrial pressure of 3 mmHg. Comparison(s): Julez Huseby prior study was performed on 03/25/2020. Prior images unable to be directly viewed, comparison made by report only. The left ventricular function is significantly worse. 03/25/2020 study at [facility] [place] reports LVEF 45-50%, similar TAVR gradients and mild perivalvular leak. LBBB is not mentioned on that report. FINDINGS  Left Ventricle: There is prominent septal-lateral left ventricular dyssynchrony due to LBBB. Left ventricular ejection fraction, by estimation, is 25 to 30%. The left ventricle has severely decreased function. The left ventricle demonstrates global hypokinesis. The left ventricular internal cavity size was mildly dilated. There is no left ventricular hypertrophy. Abnormal (paradoxical) septal motion, consistent with left bundle branch block.  Left ventricular diastolic function could not be evaluated due to atrial fibrillation. Left ventricular diastolic function could not be evaluated. Right Ventricle: The right ventricular size is normal. No increase in right ventricular wall thickness. Right ventricular systolic function is normal. There is normal pulmonary artery systolic pressure. The tricuspid regurgitant velocity is 2.61 m/s, and  with an assumed right atrial pressure of 3 mmHg, the estimated right ventricular systolic pressure is 01.0 mmHg. Left Atrium: Left atrial size was severely dilated. Right Atrium: Right atrial size was normal in size. Pericardium: There is no evidence of pericardial effusion. Mitral Valve: The mitral valve is normal in structure. Mild to moderate mitral annular calcification. Mild mitral valve regurgitation, with centrally-directed jet. No evidence of mitral valve stenosis. Tricuspid Valve: The tricuspid valve is normal in structure. Tricuspid valve regurgitation is mild. Aortic Valve: Mild perivalvular leak. The aortic valve has been repaired/replaced. Aortic valve regurgitation is mild. Aortic regurgitation PHT measures 401 msec. Aortic valve mean gradient measures 12.2 mmHg. Aortic valve peak gradient measures 24.3 mmHg. Aortic valve area, by VTI measures 1.67 cm. There is Nephi Savage 26 mm Sapien prosthetic, stented (TAVR) valve present in the aortic position. Procedure Date: 03/07/2019 at [facility]. Pulmonic Valve: The pulmonic valve was grossly normal. Pulmonic valve regurgitation is mild. Aorta: The aortic root and ascending aorta are structurally normal, with no evidence of dilitation. Venous: The inferior vena cava is normal in size with greater than 50% respiratory variability, suggesting right atrial pressure of 3 mmHg. IAS/Shunts: No atrial level shunt  detected by color flow Doppler.  LEFT VENTRICLE PLAX 2D LVIDd:         5.70 cm LVIDs:         5.00 cm LV PW:         1.20 cm LV IVS:        1.00 cm LVOT diam:      2.00 cm LV SV:         71 LV SV Index:   44 LVOT Area:     3.14 cm  RIGHT VENTRICLE         IVC TAPSE (M-mode): 1.5 cm  IVC diam: 1.80 cm LEFT ATRIUM             Index       RIGHT ATRIUM           Index LA diam:        5.10 cm 3.11 cm/m  RA Area:     18.00 cm LA Vol (A2C):   96.2 ml 58.73 ml/m RA Volume:   47.70 ml  29.12 ml/m LA Vol (A4C):   77.0 ml 47.01 ml/m LA Biplane Vol: 87.6 ml 53.48 ml/m  AORTIC VALVE AV Area (Vmax):    1.64 cm AV Area (Vmean):   1.70 cm AV Area (VTI):     1.67 cm AV Vmax:           246.47 cm/s AV Vmean:          159.496 cm/s AV VTI:            0.427 m AV Peak Grad:      24.3 mmHg AV Mean Grad:      12.2 mmHg LVOT Vmax:         128.50 cm/s LVOT Vmean:        86.400 cm/s LVOT VTI:          0.227 m LVOT/AV VTI ratio: 0.53 AI PHT:            401 msec  AORTA Ao Root diam: 3.20 cm Ao Asc diam:  3.00 cm TRICUSPID VALVE TR Peak grad:   27.2 mmHg TR Vmax:        261.00 cm/s  SHUNTS Systemic VTI:  0.23 m Systemic Diam: 2.00 cm Dani Gobble Croitoru MD Electronically signed by Sanda Klein MD Signature Date/Time: 12/20/2020/2:15:05 PM    Final     Microbiology: Recent Results (from the past 240 hour(s))  Resp Panel by RT-PCR (Flu Jhade Berko&B, Covid) Nasopharyngeal Swab     Status: None   Collection Time: 12/18/20  8:25 AM   Specimen: Nasopharyngeal Swab; Nasopharyngeal(NP) swabs in vial transport medium  Result Value Ref Range Status   SARS Coronavirus 2 by RT PCR NEGATIVE NEGATIVE Final    Comment: (NOTE) SARS-CoV-2 target nucleic acids are NOT DETECTED.  The SARS-CoV-2 RNA is generally detectable in upper respiratory specimens during the acute phase of infection. The lowest concentration of SARS-CoV-2 viral copies this assay can detect is 138 copies/mL. Lavontae Cornia negative result does not preclude SARS-Cov-2 infection and should not be used as the sole basis for treatment or other patient management decisions. Kayliee Atienza negative result may occur with  improper specimen collection/handling, submission  of specimen other than nasopharyngeal swab, presence of viral mutation(s) within the areas targeted by this assay, and inadequate number of viral copies(<138 copies/mL). Cathern Tahir negative result must be combined with clinical observations, patient history, and epidemiological information. The expected result is Negative.  Fact Sheet for Patients:  EntrepreneurPulse.com.au  Fact  Sheet for Healthcare Providers:  IncredibleEmployment.be  This test is no t yet approved or cleared by the [place] FDA and  has been authorized for detection and/or diagnosis of SARS-CoV-2 by FDA under an Emergency Use Authorization (EUA). This EUA will remain  in effect (meaning this test can be used) for the duration of the COVID-19 declaration under Section 564(b)(1) of the Act, 21 U.S.C.section 360bbb-3(b)(1), unless the authorization is terminated  or revoked sooner.       Influenza Hannan Tetzlaff by PCR NEGATIVE NEGATIVE Final   Influenza B by PCR NEGATIVE NEGATIVE Final    Comment: (NOTE) The Xpert Xpress SARS-CoV-2/FLU/RSV plus assay is intended as an aid in the diagnosis of influenza from Nasopharyngeal swab specimens and should not be used as Keshana Klemz sole basis for treatment. Nasal washings and aspirates are unacceptable for Xpert Xpress SARS-CoV-2/FLU/RSV testing.  Fact Sheet for Patients: EntrepreneurPulse.com.au  Fact Sheet for Healthcare Providers: IncredibleEmployment.be  This test is not yet approved or cleared by the [place] FDA and has been authorized for detection and/or diagnosis of SARS-CoV-2 by FDA under an Emergency Use Authorization (EUA). This EUA will remain in effect (meaning this test can be used) for the duration of the COVID-19 declaration under Section 564(b)(1) of the Act, 21 U.S.C. section 360bbb-3(b)(1), unless the authorization is terminated or revoked.  Performed at [facility], [address] [place], [place] [address]      Labs: Basic Metabolic Panel: Recent Labs  Lab 12/18/20 0825 12/18/20 1846 12/19/20 0409 12/20/20 0244 12/21/20 0139  NA 139  --  139 138 139  K 3.6  --  3.6 3.5 3.2*  CL 105  --  105 106 104  CO2 24  --  25 25 24   GLUCOSE 185*  --  125* 128* 116*  BUN 16  --  12 16 21   CREATININE 0.75  --  0.75 0.73 0.83  CALCIUM 9.1  --  8.9 8.9 9.3  MG  --  1.8  --   --  1.8  PHOS  --  3.5  --   --  4.1   Liver Function Tests: Recent Labs  Lab 12/18/20 0825 12/21/20 0139  AST 16 20  ALT 13 16  ALKPHOS 41 41  BILITOT 0.9 0.5  PROT 6.5 5.9*  ALBUMIN 3.8 3.3*   Recent Labs  Lab 12/18/20 0825  LIPASE <10*   No results for input(s): AMMONIA in the last 168 hours. CBC: Recent Labs  Lab 12/18/20 0825 12/21/20 0139  WBC 6.4 6.5  NEUTROABS 5.5 4.9  HGB 12.2 12.5  HCT 38.8 38.5  MCV 91.5 90.4  PLT 271 269   Cardiac Enzymes: No results for input(s): CKTOTAL, CKMB, CKMBINDEX, TROPONINI in the last 168 hours. BNP: BNP (last 3 results) Recent Labs    09/01/20 1223 12/18/20 0825  BNP 1,304.5* 1,444.7*    ProBNP (last 3 results) No results for input(s): PROBNP in the last 8760 hours.  CBG: No results for input(s): GLUCAP in the last 168 hours.     Signed:  Fayrene Helper MD.  [facility] 12/21/2020, 12:18 PM

## 2020-12-21 NOTE — TOC Transition Note (Signed)
Transition of Care [facility]) - CM/SW Discharge Note   Patient Details  Name: Tara Fields MRN: [id number] Date of Birth: [birth year]  [facility] CM/SW Contact:  Carles Collet, RN Phone Number: 12/21/2020, 1:48 PM   Clinical Narrative:    Tara Fields w patient at bedside. She is from [place]. Environment handicapped modified, has rollator, no other DME needs. She is agreeable to [facility] services. She specified that she did not want to use the [facility] at the facility. She did not have a preference otherwise.  Discussed w patient's son Tara Fields over the phone. He agreed to above. Referral accepted by [facility]. They will call Tara Fields to schedule.     Final next level of care: [place] Barriers to Discharge: No Barriers Identified   Patient Goals and CMS Choice Patient states their goals for this hospitalization and ongoing recovery are:: to return to [place] CMS Medicare.gov Compare Post Acute Care list provided to:: Other (Comment Required) Choice offered to / list presented to : [place]  Discharge Placement                       Discharge Plan and Services                DME Arranged: N/A         HH Arranged: PT,RN,Nurse's Aide [place] Agency: [place] ([place]) Date [place]: 12/21/20 Time [place]: [id number] Representative spoke with at [place]: Corene Cornea  Social Determinants of Health ([place]) Interventions Food Insecurity Interventions: Intervention Not Indicated Financial Strain Interventions: Intervention Not Indicated Housing Interventions: Intervention Not Indicated Transportation Interventions: Intervention Not Indicated   Readmission Risk Interventions No flowsheet data found.

## 2020-12-21 NOTE — Social Work (Signed)
CSW attempted to follow up with pt about her residency however curtain was closed in room CSW followed up with pt's son and he confirmed that pt was from [place]. MD was notified of pt's residency.

## 2020-12-22 ENCOUNTER — Telehealth: Payer: Self-pay | Admitting: Adult Health

## 2020-12-22 NOTE — Telephone Encounter (Signed)
Tara Fields w/[facility] verbal for skill nursing for 1 week 9.  May leave a detail msg on the secured voice mail.

## 2020-12-23 ENCOUNTER — Telehealth: Payer: Self-pay

## 2020-12-23 ENCOUNTER — Telehealth: Payer: Self-pay | Admitting: Adult Health

## 2020-12-23 NOTE — Telephone Encounter (Signed)
Tara Fields is calling and wanted to let the provider know that the family and patient doesn't want home health physical therapy and only wants nursing. CB is [phone]

## 2020-12-23 NOTE — Telephone Encounter (Signed)
fyi

## 2020-12-23 NOTE — Telephone Encounter (Cosign Needed)
Transition Care Management Follow-up Telephone Call  Date of discharge and from where: 12/21/2020  [facility]   How have you been since you were released from the hospital? Doing well  Any questions or concerns? No  Items Reviewed:  Did the pt receive and understand the discharge instructions provided? Yes   Medications obtained and verified? Yes   Other? No   Any new allergies since your discharge? No   Dietary orders reviewed? Yes  Do you have support at home? Yes   Home Care and Equipment/Supplies: Were home health services ordered? yes If so, what is the name of the agency?[facility]   Has the agency set up a time to come to the patient's home? yes Were any new equipment or medical supplies ordered?  No What is the name of the medical supply agency? n/a Were you able to get the supplies/equipment? not applicable Do you have any questions related to the use of the equipment or supplies? No  Functional Questionnaire: (I = Independent and D = Dependent) ADLs: D  Bathing/Dressing- D  Meal Prep- D  Eating- I  Maintaining continence- I  Transferring/Ambulation- D  Managing Meds- D  Follow up appointments reviewed:   PCP Hospital f/u appt confirmed? Yes  Scheduled to see [name] on 12/25/2020@ [facility] f/u appt confirmed? Waiting on return call   Are transportation arrangements needed? No   If their condition worsens, is the pt aware to call PCP or go to the Emergency Dept.? Yes  Was the patient provided with contact information for the PCP's office or ED? Yes  Was to pt encouraged to call back with questions or concerns? Yes

## 2020-12-23 NOTE — Telephone Encounter (Signed)
[place] for verbal orders  Thanks

## 2020-12-23 NOTE — Telephone Encounter (Signed)
Okay for verbal 

## 2020-12-23 NOTE — Telephone Encounter (Signed)
Verbal given 

## 2020-12-24 ENCOUNTER — Other Ambulatory Visit: Payer: Self-pay

## 2020-12-25 ENCOUNTER — Encounter: Payer: Self-pay | Admitting: Adult Health

## 2020-12-25 ENCOUNTER — Ambulatory Visit (INDEPENDENT_AMBULATORY_CARE_PROVIDER_SITE_OTHER): Payer: Medicare Other | Admitting: Adult Health

## 2020-12-25 ENCOUNTER — Ambulatory Visit (INDEPENDENT_AMBULATORY_CARE_PROVIDER_SITE_OTHER): Payer: Medicare Other

## 2020-12-25 VITALS — BP 138/80 | HR 82 | Temp 98.4°F | Ht 60.0 in | Wt 135.0 lb

## 2020-12-25 DIAGNOSIS — G4700 Insomnia, unspecified: Secondary | ICD-10-CM

## 2020-12-25 DIAGNOSIS — I4819 Other persistent atrial fibrillation: Secondary | ICD-10-CM | POA: Diagnosis not present

## 2020-12-25 DIAGNOSIS — R4189 Other symptoms and signs involving cognitive functions and awareness: Secondary | ICD-10-CM

## 2020-12-25 DIAGNOSIS — I5023 Acute on chronic systolic (congestive) heart failure: Secondary | ICD-10-CM

## 2020-12-25 DIAGNOSIS — J9601 Acute respiratory failure with hypoxia: Secondary | ICD-10-CM

## 2020-12-25 DIAGNOSIS — Z952 Presence of prosthetic heart valve: Secondary | ICD-10-CM | POA: Diagnosis not present

## 2020-12-25 LAB — COMPREHENSIVE METABOLIC PANEL
ALT: 17 U/L (ref 0–35)
AST: 23 U/L (ref 0–37)
Albumin: 3.9 g/dL (ref 3.5–5.2)
Alkaline Phosphatase: 54 U/L (ref 39–117)
BUN: 24 mg/dL — ABNORMAL HIGH (ref 6–23)
CO2: 23 mEq/L (ref 19–32)
Calcium: 9.1 mg/dL (ref 8.4–10.5)
Chloride: 108 mEq/L (ref 96–112)
Creatinine, Ser: 0.93 mg/dL (ref 0.40–1.20)
GFR: 53.98 mL/min — ABNORMAL LOW (ref >60.00)
Glucose, Bld: 116 mg/dL — ABNORMAL HIGH (ref 70–99)
Potassium: 4.2 mEq/L (ref 3.5–5.1)
Sodium: 140 mEq/L (ref 135–145)
Total Bilirubin: 0.7 mg/dL (ref 0.2–1.2)
Total Protein: 6.2 g/dL (ref 6.0–8.3)

## 2020-12-25 LAB — CBC WITH DIFFERENTIAL/PLATELET
Basophils Absolute: 0 10*3/uL (ref 0.0–0.1)
Basophils Relative: 0.8 % (ref 0.0–3.0)
Eosinophils Absolute: 0.1 10*3/uL (ref 0.0–0.7)
Eosinophils Relative: 1.3 % (ref 0.0–5.0)
HCT: 37.3 % (ref 36.0–46.0)
Hemoglobin: 12.4 g/dL (ref 12.0–15.0)
Lymphocytes Relative: 16.5 % (ref 12.0–46.0)
Lymphs Abs: 0.8 10*3/uL (ref 0.7–4.0)
MCHC: 33.2 g/dL (ref 30.0–36.0)
MCV: 88 fl (ref 78.0–100.0)
Monocytes Absolute: 0.4 10*3/uL (ref 0.1–1.0)
Monocytes Relative: 8.5 % (ref 3.0–12.0)
Neutro Abs: 3.5 10*3/uL (ref 1.4–7.7)
Neutrophils Relative %: 72.9 % (ref 43.0–77.0)
Platelets: 250 10*3/uL (ref 150.0–400.0)
RBC: 4.24 Mil/uL (ref 3.87–5.11)
RDW: 16 % — ABNORMAL HIGH (ref 11.5–15.5)
WBC: 4.9 10*3/uL (ref 4.0–10.5)

## 2020-12-25 MED ORDER — FUROSEMIDE 20 MG PO TABS: 10.0000 mg | ORAL_TABLET | Freq: Every day | ORAL | 0 refills | Status: DC

## 2020-12-25 MED ORDER — POTASSIUM CHLORIDE CRYS ER 10 MEQ PO TBCR: 10.0000 meq | EXTENDED_RELEASE_TABLET | Freq: Every day | ORAL | 0 refills | Status: DC

## 2020-12-25 NOTE — Progress Notes (Signed)
Subjective:    Patient ID: Tara Fields, female    DOB: 02-04-1928, 87 y.o.   MRN: [id number]  HPI  87 year old female who  has a past medical history of Aortic stenosis, CAD (coronary artery disease), COPD (chronic obstructive pulmonary disease) ([place]), Diabetes mellitus ([place]), Diverticulitis, GERD (gastroesophageal reflux disease), HTN (hypertension), Hyperlipidemia, Legally blind in left eye, as defined in [place], and Tobacco abuse.  She presents with her son for TCM visit   Admit Date: 12/18/2020 Discharge Date:  12/21/2020  She presented to the emergency room with increased short of breath, chest pain, and leg swelling.  Hospital Course  1.  Acute hypoxic respiratory failure secondary to acute on chronic heart failure and A. fib with RVR -He was diuresed during this hospital admission with IV Lasix which improved her respiratory status. -Chest x-ray on 12/19/2020 with persistent left upper lobe opacity, questionable pneumonia.  Mild interstitial pulmonary edema -CT on 5 5 with CHF diffuse bilateral interstitial thickening and small bilateral pleural effusions, lung opacity extending superiorly and left ilium consistent with postradiation scarring -She was transitioned to p.o. Lasix -Echo with worsened EF, now 25 to 30%, global hypokinesis, mild perivalvular leak -Cardiology was consulted, she was just charged with 10 mg Lasix and 10 mEq potassium and advised to follow-up with cardiology as outpatient -Was continued on losartan, metoprolol upon discharge  2. Chronic A fib  -Continue metoprolol and Eliquis  3.  Left lower lobe cancer status postradiation therapy -Appears stable on CT -Follow-up outpatient pulmonary and oncology   4. Hx. Of AS s/p TAVR  -Echo as noted above -Outpatient cardiology follow-up recommended  5. Hypertension  -Norvasc discontinued.  Continue with losartan, metoprolol, and Lasix  6. Delirium/ Concern for cognitive Deficit vs dementia/Insomnia   -Trazodone was prescribed for sleep -Recommended neurology follow-up outpatient  Today she reports that since being discharged she is feeling better, her breathing is stable and lower extremity edema has resolved since starting Lasix on a daily basis.  She has an appointment set up for cardiology in the next few weeks and has been scheduled to see Dr. Jannifer Franklin at neurology in about 2 weeks.  He reports that she has not started the trazodone yet, despite having a long history of sleep difficulty.  She denies confusion or memory deficit, feels as though sleep is causing intermittent confusion.  We will take naps during the day, but reports being woken up "day and night by spam callers".  Review of Systems  Constitutional: Positive for fatigue.  Respiratory: Negative.   Cardiovascular: Negative.   Gastrointestinal: Negative.   Genitourinary: Negative.   Musculoskeletal: Positive for arthralgias.  Neurological: Negative.   Psychiatric/Behavioral: Positive for sleep disturbance.  All other systems reviewed and are negative.   Past Medical History:  Diagnosis Date  . Aortic stenosis    s/p TAVR 26 mm Edwards Sapien 3 THV July 2020  . CAD (coronary artery disease)   . COPD (chronic obstructive pulmonary disease) ([place])   . Diabetes mellitus ([place])   . Diverticulitis   . GERD (gastroesophageal reflux disease)   . HTN (hypertension)   . Hyperlipidemia   . Legally blind in left eye, as defined in [place]   . Tobacco abuse     Social History   Socioeconomic History  . Marital status: Widowed    Spouse name: Not on file  . Number of children: 2  . Years of education: Not on file  . Highest education level: High school graduate  Occupational History  . Occupation: Retired    Comment: homemaker  Tobacco Use  . Smoking status: Former Smoker    Packs/day: 1.00    Years: 70.00    Pack years: 70.00    Types: Cigarettes    Quit date: 12/15/2019    Years since quitting: 1.0  . Smokeless  tobacco: Never Used  . Tobacco comment: Pt states quiting a year ago. 01/03/20 ARJ  Vaping Use  . Vaping Use: Never used  Substance and Sexual Activity  . Alcohol use: No    Alcohol/week: 0.0 standard drinks  . Drug use: No  . Sexual activity: Not Currently  Other Topics Concern  . Not on file  Social History Narrative   Widowed- was married for 70 years    Has two sons       Social Determinants of Health   Financial Resource Strain: Low Risk   . Difficulty of Paying Living Expenses: Not hard at all  Food Insecurity: No Food Insecurity  . Worried About Charity fundraiser in the Last Year: Never true  . Ran Out of Food in the Last Year: Never true  Transportation Needs: No Transportation Needs  . Lack of Transportation (Medical): No  . Lack of Transportation (Non-Medical): No  Physical Activity: Not on file  Stress: Not on file  Social Connections: Not on file  Intimate Partner Violence: Not on file    Past Surgical History:  Procedure Laterality Date  . ABDOMINAL HYSTERECTOMY    . APPENDECTOMY    . BACK SURGERY    . CATARACT EXTRACTION, BILATERAL    . CHOLECYSTECTOMY      Family History  Problem Relation Age of Onset  . Diabetes Mother   . Stroke Brother   . Cerebral palsy Brother   . Coronary artery disease Neg Hx     Allergies  Allergen Reactions  . Amlodipine Besy-Benazepril Hcl Cough  . Hctz [Hydrochlorothiazide] Other (See Comments)    Per family, every diuretic the patient has been on flushes out her out and ultimately results in CRAMPING/JAUNDICE (added potassium might help)  . Other Other (See Comments)    History of diverticulitis- CANNOT TOLERATE NUTS, CORN, AND ANY FOODS NOT EASILY DIGESTED- the patient AVOIDS these  . Cyclobenzaprine Other (See Comments)    Confusion- does not tolerate well   . Diclofenac Sodium Diarrhea  . Hydralazine Other (See Comments)    Per family, every diuretic the patient has been on flushes out her out and ultimately  results in CRAMPING/JAUNDICE (added potassium might help)  . Methylprednisolone Other (See Comments)    Medrol/CORTICOSTEROIDS = A LOT OF ENERGY  . Oxycodone Other (See Comments)    Caused patient to "act crazy"    Current Outpatient Medications on File Prior to Visit  Medication Sig Dispense Refill  . acetaminophen (TYLENOL) 500 MG tablet Take 500 mg by mouth every 6 (six) hours as needed for mild pain (or headaches).    [name] aspirin 81 MG chewable tablet Chew 1 tablet (81 mg total) by mouth daily. 90 tablet 3  . atorvastatin (LIPITOR) 40 MG tablet TAKE 1 TABLET BY MOUTH DAILY AT 6 PM. (Patient taking differently: Take 40 mg by mouth every evening.) 90 tablet 1  . Cholecalciferol (VITAMIN D-3 PO) Take 1,000 Units by mouth 2 (two) times daily.    . Coenzyme Q10 (CO Q-10) 300 MG CAPS Take 300 mg by mouth in the morning.    [name] ELIQUIS 5 MG TABS tablet  TAKE 1 TABLET BY MOUTH TWICE A DAY (Patient taking differently: Take 5 mg by mouth See admin instructions. Take 5 mg by mouth in the morning and evening) 180 tablet 1  . fenofibrate 160 MG tablet TAKE 1 TABLET BY MOUTH EVERY DAY (Patient taking differently: Take 160 mg by mouth every evening.) 90 tablet 1  . ferrous gluconate (FERGON) 324 MG tablet Take 1 tablet (324 mg total) by mouth daily with breakfast. 90 tablet 1  . furosemide (LASIX) 20 MG tablet Take 0.5 tablets (10 mg total) by mouth daily. 15 tablet 0  . losartan (COZAAR) 100 MG tablet TAKE 1 TABLET BY MOUTH EVERY DAY (Patient taking differently: Take 100 mg by mouth in the morning.) 30 tablet 0  . metoprolol tartrate (LOPRESSOR) 50 MG tablet TAKE 1 TABLET BY MOUTH TWICE A DAY (Patient taking differently: Take 50 mg by mouth See admin instructions. Take 50 mg by mouth in the morning and evening) 180 tablet 1  . Multiple Vitamins-Minerals (CENTRUM SILVER 50+WOMEN) TABS Take 1 tablet by mouth daily with breakfast.    . NASAL SPRAY SALINE NA Place 1 spray into both nostrils as needed (for  congestion).    . Omega-3 Fatty Acids (FISH OIL) 1200 MG CAPS Take 1,200 mg by mouth in the morning.    . potassium chloride (KLOR-CON) 10 MEQ tablet Take 1 tablet (10 mEq total) by mouth daily. 30 tablet 0  . psyllium (METAMUCIL) 58.6 % powder Take 1 packet by mouth daily as needed (for constipation).    . terazosin (HYTRIN) 10 MG capsule Take 1 capsule (10 mg total) by mouth daily at 12 noon. (Patient taking differently: Take 10 mg by mouth every evening.) 90 capsule 1  . traMADol (ULTRAM) 50 MG tablet TAKE 1 TABLET (50 MG TOTAL) BY MOUTH IN THE MORNING, AT NOON, AND AT BEDTIME. (Patient taking differently: Take 50 mg by mouth 3 (three) times daily with meals.) 90 tablet 2  . traZODone (DESYREL) 50 MG tablet Take 0.5 tablets (25 mg total) by mouth at bedtime. 15 tablet 0   No current facility-administered medications on file prior to visit.    BP 138/80 (BP Location: Right Arm, Patient Position: Sitting, Cuff Size: Normal)   Pulse 82   Temp 98.4 F (36.9 C) (Oral)   Ht 5' (1.524 m)   Wt 135 lb (61.2 kg)   SpO2 97%   BMI 26.37 kg/m       Objective:   Physical Exam Vitals and nursing note reviewed.  Constitutional:      Appearance: Normal appearance.  Cardiovascular:     Rate and Rhythm: Normal rate. Rhythm irregular.     Pulses: Normal pulses.     Heart sounds: Normal heart sounds.  Pulmonary:     Effort: Pulmonary effort is normal.     Breath sounds: Normal breath sounds.  Musculoskeletal:        General: Normal range of motion.     Right lower leg: No edema.     Left lower leg: No edema.  Skin:    General: Skin is warm and dry.  Neurological:     General: No focal deficit present.     Mental Status: She is alert and oriented to person, place, and time.     Motor: Weakness present.     Gait: Gait abnormal (in wheelchair).  Psychiatric:        Mood and Affect: Mood normal.        Behavior: Behavior normal.  Thought Content: Thought content normal.         Judgment: Judgment normal.       Assessment & Plan:  1. Acute on chronic systolic congestive heart failure [facility]) [facility] notes, imaging, labs, discharge instructions.  All questions answered to the best of my ability.  Appears euvolemic today.  Continue with Lasix and potassium.  Follow-up with cardiology as directed - CBC with Differential/Platelet; Future - Comprehensive metabolic panel; Future - DG Chest 2 View; Future - Comprehensive metabolic panel - CBC with Differential/Platelet  2. Other persistent atrial fibrillation ([place]) - Continue with metoprolol and Eliquis - Follow up with cardiology   3. Cognitive deficits - MMSE during this visit 28/30 - missed on on delayed verbal recall and copy design. Does not appear confused or have memory deficit today  MMSE - Mini Mental State Exam 12/25/2020  Orientation to time 5  Orientation to Place 5  Registration 3  Attention/ Calculation 5  Recall 2  Language- name 2 objects 2  Language- repeat 1  Language- follow 3 step command 3  Language- read & follow direction 1  Write a sentence 1  Copy design 0  Total score 28      4. S/P TAVR (transcatheter aortic valve replacement) - Follow up with Cardiology as directeds  5. Acute respiratory failure with hypoxia ([facility]) - Resolved  - CBC with Differential/Platelet; Future - Comprehensive metabolic panel; Future - DG Chest 2 View; Future - Comprehensive metabolic panel - CBC with Differential/Platelet  6. Insomnia, unspecified type - Encouraged to try Trazodone to see if this helps with her chronic sleep  - Put phone on silent when sleeping  - Try to refrain from taking naps in the day time -  Go to bed around the same time each night

## 2020-12-30 ENCOUNTER — Telehealth: Payer: Self-pay | Admitting: Emergency Medicine

## 2020-12-30 NOTE — Telephone Encounter (Signed)
Called and spoke with pt's son Louie Casa who stated since pt's last OV with RB, pt has either had to go to the ED to be seen or has even been admitted to the hospital.  Pt was seen at [facility] on 11/10/20 and had some chest xrays performed. Prior on 11/08/20, pt had a head CT performed.  On 12/18/20, pt was admitted at [facility] and had more imaging performed as well as labwork.  Louie Casa is wanting to Dr. Lamonte Sakai to review all of this and advise on it for both him and pt.  Dr. Lamonte Sakai, please advise.

## 2020-12-31 NOTE — Telephone Encounter (Signed)
Tried again. Left VM

## 2020-12-31 NOTE — Telephone Encounter (Signed)
Called Randy and left a VM. Will try him again

## 2020-12-31 NOTE — Telephone Encounter (Signed)
Tara Fields son is returning phone call. Tara Fields phone number is [phone].

## 2020-12-31 NOTE — Telephone Encounter (Signed)
RB pts son, Tara Fields is calling back about the results of the scans.

## 2021-01-01 NOTE — Telephone Encounter (Signed)
Called and left a detailed message asking Tara Fields to return our call with when the best time to call him and at what number as Dr. Lamonte Sakai has attempted to connect with Tara Fields twice. Once [place] calls back with this information, will forward to Dr. Lamonte Sakai.

## 2021-01-02 NOTE — Telephone Encounter (Signed)
RB please advise when you've spoken to son, thanks!

## 2021-01-04 ENCOUNTER — Emergency Department (HOSPITAL_BASED_OUTPATIENT_CLINIC_OR_DEPARTMENT_OTHER): Payer: Medicare Other

## 2021-01-04 ENCOUNTER — Emergency Department (HOSPITAL_BASED_OUTPATIENT_CLINIC_OR_DEPARTMENT_OTHER)
Admission: EM | Admit: 2021-01-04 | Discharge: 2021-01-04 | Disposition: A | Discharge status: Home / Self Care | Payer: Medicare Other | Attending: Emergency Medicine | Admitting: Emergency Medicine

## 2021-01-04 ENCOUNTER — Encounter (HOSPITAL_BASED_OUTPATIENT_CLINIC_OR_DEPARTMENT_OTHER): Payer: Self-pay

## 2021-01-04 DIAGNOSIS — Z7901 Long term (current) use of anticoagulants: Secondary | ICD-10-CM | POA: Diagnosis not present

## 2021-01-04 DIAGNOSIS — Z20822 Contact with and (suspected) exposure to covid-19: Secondary | ICD-10-CM | POA: Insufficient documentation

## 2021-01-04 DIAGNOSIS — Z7982 Long term (current) use of aspirin: Secondary | ICD-10-CM | POA: Insufficient documentation

## 2021-01-04 DIAGNOSIS — I110 Hypertensive heart disease with heart failure: Secondary | ICD-10-CM | POA: Insufficient documentation

## 2021-01-04 DIAGNOSIS — Z87891 Personal history of nicotine dependence: Secondary | ICD-10-CM | POA: Insufficient documentation

## 2021-01-04 DIAGNOSIS — I2510 Atherosclerotic heart disease of native coronary artery without angina pectoris: Secondary | ICD-10-CM | POA: Insufficient documentation

## 2021-01-04 DIAGNOSIS — Z79899 Other long term (current) drug therapy: Secondary | ICD-10-CM | POA: Diagnosis not present

## 2021-01-04 DIAGNOSIS — I5023 Acute on chronic systolic (congestive) heart failure: Secondary | ICD-10-CM | POA: Diagnosis not present

## 2021-01-04 DIAGNOSIS — R0602 Shortness of breath: Secondary | ICD-10-CM | POA: Diagnosis present

## 2021-01-04 DIAGNOSIS — J449 Chronic obstructive pulmonary disease, unspecified: Secondary | ICD-10-CM | POA: Insufficient documentation

## 2021-01-04 DIAGNOSIS — E119 Type 2 diabetes mellitus without complications: Secondary | ICD-10-CM | POA: Diagnosis not present

## 2021-01-04 LAB — COMPREHENSIVE METABOLIC PANEL
ALT: 12 U/L (ref 0–44)
AST: 16 U/L (ref 15–41)
Albumin: 3.7 g/dL (ref 3.5–5.0)
Alkaline Phosphatase: 39 U/L (ref 38–126)
Anion gap: 11 (ref 5–15)
BUN: 19 mg/dL (ref 8–23)
CO2: 21 mmol/L — ABNORMAL LOW (ref 22–32)
Calcium: 8.7 mg/dL — ABNORMAL LOW (ref 8.9–10.3)
Chloride: 106 mmol/L (ref 98–111)
Creatinine, Ser: 0.8 mg/dL (ref 0.44–1.00)
GFR, Estimated: >60 mL/min (ref >60)
Glucose, Bld: 132 mg/dL — ABNORMAL HIGH (ref 70–99)
Potassium: 3.8 mmol/L (ref 3.5–5.1)
Sodium: 138 mmol/L (ref 135–145)
Total Bilirubin: 1 mg/dL (ref 0.3–1.2)
Total Protein: 6.4 g/dL — ABNORMAL LOW (ref 6.5–8.1)

## 2021-01-04 LAB — BRAIN NATRIURETIC PEPTIDE: B Natriuretic Peptide: 1852.7 pg/mL — ABNORMAL HIGH (ref 0.0–100.0)

## 2021-01-04 LAB — CBC WITH DIFFERENTIAL/PLATELET
Abs Immature Granulocytes: 0.01 10*3/uL (ref 0.00–0.07)
Basophils Absolute: 0 10*3/uL (ref 0.0–0.1)
Basophils Relative: 0 %
Eosinophils Absolute: 0.1 10*3/uL (ref 0.0–0.5)
Eosinophils Relative: 2 %
HCT: 36.6 % (ref 36.0–46.0)
Hemoglobin: 11.7 g/dL — ABNORMAL LOW (ref 12.0–15.0)
Immature Granulocytes: 0 %
Lymphocytes Relative: 10 %
Lymphs Abs: 0.6 10*3/uL — ABNORMAL LOW (ref 0.7–4.0)
MCH: 28.8 pg (ref 26.0–34.0)
MCHC: 32 g/dL (ref 30.0–36.0)
MCV: 90.1 fL (ref 80.0–100.0)
Monocytes Absolute: 0.4 10*3/uL (ref 0.1–1.0)
Monocytes Relative: 6 %
Neutro Abs: 4.9 10*3/uL (ref 1.7–7.7)
Neutrophils Relative %: 82 %
Platelets: 228 10*3/uL (ref 150–400)
RBC: 4.06 MIL/uL (ref 3.87–5.11)
RDW: 15.9 % — ABNORMAL HIGH (ref 11.5–15.5)
WBC: 5.9 10*3/uL (ref 4.0–10.5)
nRBC: 0 % (ref 0.0–0.2)

## 2021-01-04 LAB — LIPASE, BLOOD: Lipase: <10 U/L — ABNORMAL LOW (ref 11–51)

## 2021-01-04 LAB — MAGNESIUM: Magnesium: 1.8 mg/dL (ref 1.7–2.4)

## 2021-01-04 LAB — RESP PANEL BY RT-PCR (FLU A&B, COVID) ARPGX2
Influenza A by PCR: NEGATIVE
Influenza B by PCR: NEGATIVE
SARS Coronavirus 2 by RT PCR: NEGATIVE

## 2021-01-04 LAB — TROPONIN I (HIGH SENSITIVITY): Troponin I (High Sensitivity): 12 ng/L (ref <18)

## 2021-01-04 MED ORDER — NITROGLYCERIN 2 % TD OINT
0.5000 [in_us] | TOPICAL_OINTMENT | Freq: Once | TRANSDERMAL | Status: AC
  Administered 2021-01-04: 0.5 [in_us] via TOPICAL
  Filled 2021-01-04: qty 1

## 2021-01-04 MED ORDER — FUROSEMIDE 10 MG/ML IJ SOLN
40.0000 mg | Freq: Once | INTRAMUSCULAR | Status: AC
  Administered 2021-01-04: 40 mg via INTRAVENOUS
  Filled 2021-01-04: qty 4

## 2021-01-04 MED ORDER — TRAMADOL HCL 50 MG PO TABS
50.0000 mg | ORAL_TABLET | Freq: Once | ORAL | Status: AC
  Administered 2021-01-04: 50 mg via ORAL
  Filled 2021-01-04: qty 1

## 2021-01-04 NOTE — Discharge Instructions (Addendum)
You can start taking Lasix 20 mg (1 tablet) twice a day for 5 days.    We have sent cardiology service a message to see if they can see you sooner.  If they are unable to get you in, make sure that you see your primary care doctor in 1 week for recheck and to draw lab.

## 2021-01-04 NOTE — ED Notes (Signed)
Lab called to add Troponin and lipase.

## 2021-01-04 NOTE — ED Provider Notes (Signed)
[facility] EMERGENCY DEPT Provider Note   CSN: [id number] Arrival date & time: 01/04/21  0257     History Chief Complaint  Patient presents with  . Shortness of Breath  . Leg Swelling    Tara Fields is a 87 y.o. female.  The history is provided by the patient, a relative and a caregiver.  Shortness of Breath Severity:  Moderate Onset quality:  Gradual Timing:  Intermittent Progression:  Worsening Chronicity:  Recurrent Context: activity   Relieved by:  Rest Worsened by:  Activity Associated symptoms: no chest pain, no cough, no fever and no vomiting   Associated symptoms comment:  Abdominal fullness/swelling Patient with history of CHF, CAD, COPD, diabetes, hypertension, aortic stenosis s/p TAVR presents with shortness of breath.  Patient reports over the past 4-5 days has had increasing shortness of breath.  She reports it is worse with exertion.  She cannot tolerate lying flat due to chronic back pain.  No chest pain.  No fevers.  She is not on home oxygen. This seems similar to prior episodes of CHF exacerbation     Past Medical History:  Diagnosis Date  . Aortic stenosis    s/p TAVR 26 mm Edwards Sapien 3 THV July 2020  . CAD (coronary artery disease)   . COPD (chronic obstructive pulmonary disease) ([place])   . Diabetes mellitus ([place])   . Diverticulitis   . GERD (gastroesophageal reflux disease)   . HTN (hypertension)   . Hyperlipidemia   . Legally blind in left eye, as defined in [place]   . Tobacco abuse     Patient Active Problem List   Diagnosis Date Noted  . Cognitive deficits   . Pressure injury of skin 12/19/2020  . Acute exacerbation of CHF (congestive heart failure) ([place]) 12/18/2020  . CHF (congestive heart failure) ([place]) 12/18/2020  . COPD (chronic obstructive pulmonary disease) ([place]) 09/02/2020  . Dyslipidemia 09/02/2020  . Acute respiratory failure with hypoxia ([place]) 09/01/2020  . Pulmonary nodule 1 cm or greater in diameter  01/03/2020  . S/P TAVR (transcatheter aortic valve replacement) 05/03/2019  . Primary osteoarthritis involving multiple joints 05/03/2019  . Other persistent atrial fibrillation ([place]) 05/03/2019  . Bilateral carotid artery stenosis 12/01/2017  . 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/13/2014  . Aortic valve disorder 05/03/2014  . Chronic obstructive bronchitis without exacerbation ([place]) 05/03/2014  . Derangement of left knee 05/03/2014  . Esophageal reflux 05/03/2014  . Essential hypertension 05/03/2014  . Pure hypercholesterolemia 05/03/2014  . Type II diabetes mellitus ([place]) 05/03/2014  . Vitamin D deficiency 05/03/2014    Past Surgical History:  Procedure Laterality Date  . ABDOMINAL HYSTERECTOMY    . APPENDECTOMY    . BACK SURGERY    . CATARACT EXTRACTION, BILATERAL    . CHOLECYSTECTOMY       OB History    Gravida  2   Para  2   Term      Preterm      AB      Living        SAB      IAB      Ectopic      Multiple      Live Births              Family History  Problem Relation Age of Onset  . Diabetes Mother   . Stroke Brother   . Cerebral palsy Brother   . Coronary artery disease Neg Hx     Social History  Tobacco Use  . Smoking status: Former Smoker    Packs/day: 1.00    Years: 70.00    Pack years: 70.00    Types: Cigarettes    Quit date: 12/15/2019    Years since quitting: 1.0  . Smokeless tobacco: Never Used  . Tobacco comment: Pt states quiting a year ago. 01/03/20 [facility]  Vaping Use  . Vaping Use: Never used  Substance Use Topics  . Alcohol use: No    Alcohol/week: 0.0 standard drinks  . Drug use: No    Home Medications Prior to Admission medications   Medication Sig Start Date End Date Taking? Authorizing Provider  acetaminophen (TYLENOL) 500 MG tablet Take 500 mg by mouth every 6 (six) hours as needed for mild pain (or headaches).    [provider]  aspirin 81 MG chewable tablet Chew 1 tablet (81 mg total) by mouth  daily. 04/01/20   Nafziger, Tommi Rumps, NP  atorvastatin (LIPITOR) 40 MG tablet TAKE 1 TABLET BY MOUTH DAILY AT 6 PM. Patient taking differently: Take 40 mg by mouth every evening. 10/09/20   Nafziger, Tommi Rumps, NP  Cholecalciferol (VITAMIN D-3 PO) Take 1,000 Units by mouth 2 (two) times daily.    [provider]  Coenzyme Q10 (CO Q-10) 300 MG CAPS Take 300 mg by mouth in the morning.    [provider]  ELIQUIS 5 MG TABS tablet TAKE 1 TABLET BY MOUTH TWICE A DAY Patient taking differently: Take 5 mg by mouth See admin instructions. Take 5 mg by mouth in the morning and evening 10/22/20   Nafziger, Tommi Rumps, NP  fenofibrate 160 MG tablet TAKE 1 TABLET BY MOUTH EVERY DAY Patient taking differently: Take 160 mg by mouth every evening. 10/22/20   Nafziger, Tommi Rumps, NP  ferrous gluconate (FERGON) 324 MG tablet Take 1 tablet (324 mg total) by mouth daily with breakfast. 10/09/20   Nafziger, Tommi Rumps, NP  furosemide (LASIX) 20 MG tablet Take 0.5 tablets (10 mg total) by mouth daily. 12/25/20 01/24/21  Nafziger, Tommi Rumps, NP  losartan (COZAAR) 100 MG tablet TAKE 1 TABLET BY MOUTH EVERY DAY Patient taking differently: Take 100 mg by mouth in the morning. 11/10/20   Burnell Blanks, MD  metoprolol tartrate (LOPRESSOR) 50 MG tablet TAKE 1 TABLET BY MOUTH TWICE A DAY Patient taking differently: Take 50 mg by mouth See admin instructions. Take 50 mg by mouth in the morning and evening 10/22/20   Nafziger, Tommi Rumps, NP  Multiple Vitamins-Minerals (CENTRUM SILVER 50+WOMEN) TABS Take 1 tablet by mouth daily with breakfast.    [provider]  NASAL SPRAY SALINE NA Place 1 spray into both nostrils as needed (for congestion).    [provider]  Omega-3 Fatty Acids (FISH OIL) 1200 MG CAPS Take 1,200 mg by mouth in the morning.    [provider]  potassium chloride (KLOR-CON) 10 MEQ tablet Take 1 tablet (10 mEq total) by mouth daily. 12/25/20 01/24/21  Nafziger, Tommi Rumps, NP  psyllium (METAMUCIL) 58.6 %  powder Take 1 packet by mouth daily as needed (for constipation).    [provider]  terazosin (HYTRIN) 10 MG capsule Take 1 capsule (10 mg total) by mouth daily at 12 noon. Patient taking differently: Take 10 mg by mouth every evening. 12/16/20   Nafziger, Tommi Rumps, NP  traMADol (ULTRAM) 50 MG tablet TAKE 1 TABLET (50 MG TOTAL) BY MOUTH IN THE MORNING, AT NOON, AND AT BEDTIME. Patient taking differently: Take 50 mg by mouth 3 (three) times daily with meals.  10/03/20   Nafziger, Tommi Rumps, NP  traZODone (DESYREL) 50 MG tablet Take 0.5 tablets (25 mg total) by mouth at bedtime. 12/21/20 01/20/21  Elodia Florence., MD    Allergies    Amlodipine besy-benazepril hcl, Hctz [hydrochlorothiazide], Other, Cyclobenzaprine, Diclofenac sodium, Hydralazine, Methylprednisolone, and Oxycodone  Review of Systems   Review of Systems  Constitutional: Negative for fever.  Respiratory: Positive for shortness of breath. Negative for cough.   Cardiovascular: Positive for leg swelling. Negative for chest pain.  Gastrointestinal: Positive for abdominal distention. Negative for blood in stool, constipation and vomiting.  All other systems reviewed and are negative.   Physical Exam Updated Vital Signs BP (!) 145/74   Pulse 90   Temp 97.6 F (36.4 C) (Oral)   Resp (!) 25   Ht 1.524 m (5')   Wt 68.1 kg   SpO2 97%   BMI 29.32 kg/m   Physical Exam CONSTITUTIONAL: Elderly, no acute distress HEAD: Normocephalic/atraumatic EYES: EOMI/PERRL ENMT: Mucous membranes moist NECK: supple no meningeal signs SPINE/BACK: Kyphotic spine CV: Irregular no harsh murmurs LUNGS: Decreased breath sounds bilaterally, mild tachypnea ABDOMEN: soft, nontender, no rebound or guarding, bowel sounds noted throughout abdomen GU:no cva tenderness NEURO: Pt is awake/alert/appropriate, moves all extremitiesx4.  No facial droop.   EXTREMITIES: pulses normal/equal, full ROM, mild symmetric pitting edema in the bilateral lower  extremities SKIN: warm, color normal PSYCH: no abnormalities of mood noted, alert and oriented to situation  ED Results / Procedures / Treatments   Labs (all labs ordered are listed, but only abnormal results are displayed) Labs Reviewed  CBC WITH DIFFERENTIAL/PLATELET - Abnormal; Notable for the following components:      Result Value   Hemoglobin 11.7 (*)    RDW 15.9 (*)    Lymphs Abs 0.6 (*)    All other components within normal limits  BRAIN NATRIURETIC PEPTIDE - Abnormal; Notable for the following components:   B Natriuretic Peptide 1,852.7 (*)    All other components within normal limits  COMPREHENSIVE METABOLIC PANEL - Abnormal; Notable for the following components:   CO2 21 (*)    Glucose, Bld 132 (*)    Calcium 8.7 (*)    Total Protein 6.4 (*)    All other components within normal limits  LIPASE, BLOOD - Abnormal; Notable for the following components:   Lipase <10 (*)    All other components within normal limits  RESP PANEL BY RT-PCR (FLU A&B, COVID) ARPGX2  MAGNESIUM  TROPONIN I (HIGH SENSITIVITY)    EKG EKG Interpretation  Date/Time:  Sunday Jan 04 2021 03:11:24 EDT Ventricular Rate:  91 PR Interval:    QRS Duration: 171 QT Interval:  418 QTC Calculation: 515 R Axis:   136 Text Interpretation: Atrial fibrillation Nonspecific intraventricular conduction delay Repol abnrm, probable ischemia, lateral leads Confirmed by Ripley Fraise [phone]) on 01/04/2021 3:14:08 AM   Radiology DG Chest Port 1 View  Result Date: 01/04/2021 CLINICAL DATA:  Shortness of breath EXAM: PORTABLE CHEST 1 VIEW COMPARISON:  12/25/2020 FINDINGS: Mild cardiomegaly. Diffuse interstitial opacity. No pneumothorax or sizable pleural effusion. No focal airspace consolidation. Prosthetic aortic valve. Unchanged upper left lung scarring. IMPRESSION: Cardiomegaly and diffuse interstitial opacity likely indicating mild pulmonary edema. Electronically Signed   By: Ulyses Jarred M.D.   On:  01/04/2021 03:53    Procedures Procedures   Medications Ordered in ED Medications  nitroGLYCERIN (NITROGLYN) 2 % ointment 0.5 inch (0.5 inches Topical Given 01/04/21 0349)  furosemide (LASIX) injection 40 mg (40  mg Intravenous Given 01/04/21 0425)    ED Course  I have reviewed the triage vital signs and the nursing notes.  Pertinent labs & imaging results that were available during my care of the patient were reviewed by me and considered in my medical decision making (see chart for details).    MDM Rules/Calculators/A&P                          3:46 AM Patient with extensive medical history presents with increasing shortness of breath and likely CHF exacerbation.  Patient only takes 10 mg of Lasix daily.  She reports medication compliance.  Patient reports increasing shortness of breath and dyspnea on exertion as well as lower extremity edema.  Room air pulse ox 96%.  Patient is mild tachypneic but is in no acute distress.  We will start with blood pressure management and diuresis.  Patient would like to avoid another admission as she was admitted recently 4:19 AM Blood pressure has remained stable after nitroglycerin.  We will begin diuresis 4:32 AM Patient reports upper abdominal pressure.  She reports it is similar to last time she was admitted.  She underwent CT imaging at that time and no cause was found.  Patient reports it feels like she has too much fluid and that is causing her issues.  She has mild tenderness to her epigastric region without any hernia.  Plan to diurese. 6:47 AM Patient continues to improve, but still does not feel improved enough to be discharged.  I suspect patient will be able to be discharged as she continues to [place] patient if she is discharged to take 20 mg Lasix twice daily for 5 days.  She can follow-up with her PCP in a week 7:03 AM Signed out to Dr. Kathrynn Humble at shift change Final Clinical Impression(s) / ED Diagnoses Final diagnoses:   Acute on chronic systolic congestive heart failure [facility])    Rx / DC Orders ED Discharge Orders    None       Ripley Fraise, MD 01/04/21 [phone]

## 2021-01-04 NOTE — ED Provider Notes (Signed)
  Physical Exam  BP 121/90   Pulse 95   Temp 98 F (36.7 C) (Oral)   Resp 16   Ht 5' (1.524 m)   Wt 68.1 kg   SpO2 96%   BMI 29.32 kg/m   Physical Exam  ED Course/Procedures     Procedures  MDM   Assuming care of patient from Dr. Christy Gentles   Patient in the ED for shortness of breath.  Has history of CHF. Workup thus far shows elevated BNP, chest x-ray with mild lymphadenopathy.  COVID-19 test is negative  Concerning findings are as following : None Important pending results are : None  According to Dr. Christy Gentles, plan is to discharge the patient post reassessment.  Patient had no complains, no concerns from the nursing side. Will continue to monitor.   8:17 AM Patient reassessed.  She is sitting on the wheelchair, ready to go, already dressed up.  Son at the bedside.  They both want her to go.  She lives at independent living facility.  I discussed the plan of taking 20 mg Lasix daily for the next week.  I was informed that cardiology service appointment is at least 3 weeks away, I will send a message to them to see if they can see her sooner.  In the interim, they will take 20 mg twice daily for the next week, contact PCP for close follow-up and return to the ER if the symptoms are getting worse.      Varney Biles, MD 01/04/21 [phone]

## 2021-01-04 NOTE — ED Triage Notes (Addendum)
Pt is present to the ED for increased SOB x 4-5 days. Pt also c/o lower extremity swelling and generalized abd pain. Pt is taking lasix PO daily but has not increased her dose. 95% RA.

## 2021-01-04 NOTE — ED Notes (Signed)
Assisted to bedside commode,voided 100cc. Now sitting in wheelchair.

## 2021-01-06 ENCOUNTER — Ambulatory Visit: Payer: BLUE CROSS/BLUE SHIELD | Admitting: Neurology

## 2021-01-06 NOTE — Telephone Encounter (Signed)
I've reviewed all the notes from the hospital, reviewed her films. It looks like the principal problem has been her heart failure and volume overload. I presume she has follow up with cardiology? I think this is the most important thing to do to help get her stabilized. I would be happy to see her in the office if they believe that we can be helpful as well.

## 2021-01-06 NOTE — Telephone Encounter (Signed)
Tara Fields returning a phone call. Concerned if his mom need to be seen by RB as he had to take her back to the hospital on this past weekend. She was prescribed lasix to get the fluid off. Tara Fields can be reached at [phone]. Tara Fields will be available all day.

## 2021-01-06 NOTE — Telephone Encounter (Signed)
I called and spoke with pt's son, Louie Casa ok per DPR I notified him of response per RB  He verbalized understanding  Nothing further needed

## 2021-01-07 NOTE — Progress Notes (Signed)
Chief Complaint  Patient presents with  . Follow-up    LE edema   History of Present Illness: 87 yo female with history of severe AS s/p TAVR at [facility], COPD, CAD, GERD, persistent atrial fibrillation, DM, HTN, HLD, tobacco abuse and diverticulitis here today for follow up. I saw her remotely in 2016. She re-established in my office January 2022. She underwent TAVR at [facility] 03/07/19 with placement of a 26 mm Sapien Ultra THV from the right femoral artery approach. She developed a pseudoaneurysm post TAVR and had thrombin injection. She has atrial fibrillation and is on Eliquis. She has carotid artery disease. She tells me that no stent was placed in her carotid artery. Cardiac cath March 2019 at [facility] with 25% mid LAD stenosis, 50% stenosis second diagonal branch, 50% distal Circumflex stenosis, 75% ostial stenosis of a small Diagonal branch. Echo September 2020 at [place] with LVEF=45-50%. Moderate MR. Bioprosthetic aortic valve working well. Repeat echo at [place] July 2021 with no change, Trivial perivalvular leak noted. She was admitted to [facility] 12/19/20 with acute systolic CHF. Echo 12/20/20 with LVEF=25-30%. No ischemic evaluation was planned per discussion with Dr. Percival Spanish and the patient. She was diuresed with IV Lasix. Norvasc was stopped. She was seen in the ED 01/04/21 with c/o dyspnea. BNP was elevated at 1852 but overall unchanged over last few checks over the last few months. Troponin negative. Lasix increased to 20 mg po BID.   She is here today for follow up. The patient denies any chest pain, palpitations, orthopnea, PND, dizziness, near syncope or syncope. Her LE edema is improved on lasix. No chest pain. She has abdominal pain and nausea.   Primary Care Physician: [name], NP  Past Medical History:  Diagnosis Date  . Aortic stenosis    s/p TAVR 26 mm Edwards Sapien 3 THV July 2020  . CAD (coronary artery disease)   . COPD (chronic obstructive pulmonary disease) ([place])   . Diabetes  mellitus ([place])   . Diverticulitis   . GERD (gastroesophageal reflux disease)   . HTN (hypertension)   . Hyperlipidemia   . Legally blind in left eye, as defined in [place]   . Tobacco abuse     Past Surgical History:  Procedure Laterality Date  . ABDOMINAL HYSTERECTOMY    . APPENDECTOMY    . BACK SURGERY    . CATARACT EXTRACTION, BILATERAL    . CHOLECYSTECTOMY      Current Outpatient Medications  Medication Sig Dispense Refill  . acetaminophen (TYLENOL) 500 MG tablet Take 500 mg by mouth every 6 (six) hours as needed for mild pain (or headaches).    [name] aspirin 81 MG chewable tablet Chew 1 tablet (81 mg total) by mouth daily. 90 tablet 3  . atorvastatin (LIPITOR) 40 MG tablet TAKE 1 TABLET BY MOUTH DAILY AT 6 PM. 90 tablet 1  . Cholecalciferol (VITAMIN D-3 PO) Take 1,000 Units by mouth 2 (two) times daily.    . Coenzyme Q10 (CO Q-10) 300 MG CAPS Take 300 mg by mouth in the morning.    [name] ELIQUIS 5 MG TABS tablet TAKE 1 TABLET BY MOUTH TWICE A DAY 180 tablet 1  . fenofibrate 160 MG tablet TAKE 1 TABLET BY MOUTH EVERY DAY 90 tablet 1  . ferrous gluconate (FERGON) 324 MG tablet Take 1 tablet (324 mg total) by mouth daily with breakfast. 90 tablet 1  . furosemide (LASIX) 20 MG tablet Take 1 tablet (20 mg total) by mouth 2 (  two) times daily. 180 tablet 3  . losartan (COZAAR) 100 MG tablet TAKE 1 TABLET BY MOUTH EVERY DAY 30 tablet 0  . metoprolol tartrate (LOPRESSOR) 50 MG tablet TAKE 1 TABLET BY MOUTH TWICE A DAY 180 tablet 1  . Multiple Vitamins-Minerals (CENTRUM SILVER 50+WOMEN) TABS Take 1 tablet by mouth daily with breakfast.    . NASAL SPRAY SALINE NA Place 1 spray into both nostrils as needed (for congestion).    . Omega-3 Fatty Acids (FISH OIL) 1200 MG CAPS Take 1,200 mg by mouth in the morning.    . psyllium (METAMUCIL) 58.6 % powder Take 1 packet by mouth daily as needed (for constipation).    . terazosin (HYTRIN) 10 MG capsule Take 1 capsule (10 mg total) by mouth daily at 12 noon.  90 capsule 1  . traMADol (ULTRAM) 50 MG tablet TAKE 1 TABLET (50 MG TOTAL) BY MOUTH IN THE MORNING, AT NOON, AND AT BEDTIME. 90 tablet 2  . traZODone (DESYREL) 50 MG tablet Take 0.5 tablets (25 mg total) by mouth at bedtime. 15 tablet 0  . potassium chloride (KLOR-CON) 10 MEQ tablet Take 1 tablet (10 mEq total) by mouth daily. 90 tablet 3   No current facility-administered medications for this visit.    Allergies  Allergen Reactions  . Amlodipine Besy-Benazepril Hcl Cough  . Hctz [Hydrochlorothiazide] Other (See Comments)    Per family, every diuretic the patient has been on flushes out her out and ultimately results in CRAMPING/JAUNDICE (added potassium might help)  . Other Other (See Comments)    History of diverticulitis- CANNOT TOLERATE NUTS, CORN, AND ANY FOODS NOT EASILY DIGESTED- the patient AVOIDS these  . Cyclobenzaprine Other (See Comments)    Confusion- does not tolerate well   . Diclofenac Sodium Diarrhea  . Hydralazine Other (See Comments)    Per family, every diuretic the patient has been on flushes out her out and ultimately results in CRAMPING/JAUNDICE (added potassium might help)  . Methylprednisolone Other (See Comments)    Medrol/CORTICOSTEROIDS = A LOT OF ENERGY  . Oxycodone Other (See Comments)    Caused patient to "act crazy"    Social History   Socioeconomic History  . Marital status: Widowed    Spouse name: Not on file  . Number of children: 2  . Years of education: Not on file  . Highest education level: High school graduate  Occupational History  . Occupation: Retired    Comment: homemaker  Tobacco Use  . Smoking status: Former Smoker    Packs/day: 1.00    Years: 70.00    Pack years: 70.00    Types: Cigarettes    Quit date: 12/15/2019    Years since quitting: 1.0  . Smokeless tobacco: Never Used  . Tobacco comment: Pt states quiting a year ago. 01/03/20 ARJ  Vaping Use  . Vaping Use: Never used  Substance and Sexual Activity  . Alcohol use: No     Alcohol/week: 0.0 standard drinks  . Drug use: No  . Sexual activity: Not Currently  Other Topics Concern  . Not on file  Social History Narrative   Widowed- was married for 79 years    Has two sons       Social Determinants of Health   Financial Resource Strain: Low Risk   . Difficulty of Paying Living Expenses: Not hard at all  Food Insecurity: No Food Insecurity  . Worried About Charity fundraiser in the Last Year: Never true  . Ran  Out of Food in the Last Year: Never true  Transportation Needs: No Transportation Needs  . Lack of Transportation (Medical): No  . Lack of Transportation (Non-Medical): No  Physical Activity: Not on file  Stress: Not on file  Social Connections: Not on file  Intimate Partner Violence: Not on file    Family History  Problem Relation Age of Onset  . Diabetes Mother   . Stroke Brother   . Cerebral palsy Brother   . Coronary artery disease Neg Hx     Review of Systems:  As stated in the HPI and otherwise negative.   BP 132/60   Pulse 86   Ht 5' (1.524 m)   Wt 149 lb 12.8 oz (67.9 kg)   SpO2 96%   BMI 29.26 kg/m   Physical Examination: General: Well developed, well nourished, NAD  HEENT: OP clear, mucus membranes moist  SKIN: warm, dry. No rashes. Neuro: No focal deficits  Musculoskeletal: Muscle strength 5/5 all ext  Psychiatric: Mood and affect normal  Neck: No JVD, no carotid bruits, no thyromegaly, no lymphadenopathy.  Lungs:Clear bilaterally, no wheezes, rhonci, crackles Cardiovascular: Regular rate and rhythm. No murmurs, gallops or rubs. Abdomen:Soft. Bowel sounds present. Non-tender.  Extremities: Trace bilateral lower extremity edema. Pulses are 2 + in the bilateral DP/PT.  EKG:  EKG is not ordered today. The ekg ordered today demonstrates   Recent Labs: 01/04/2021: ALT 12; B Natriuretic Peptide 1,852.7; BUN 19; Creatinine, Ser 0.80; Hemoglobin 11.7; Magnesium 1.8; Platelets 228; Potassium 3.8; Sodium 138   Lipid  Panel No results found for: CHOL, TRIG, HDL, CHOLHDL, VLDL, LDLCALC, LDLDIRECT   Wt Readings from Last 3 Encounters:  01/08/21 149 lb 12.8 oz (67.9 kg)  01/04/21 150 lb 2.1 oz (68.1 kg)  12/25/20 135 lb (61.2 kg)     Other studies Reviewed: Additional studies/ records that were reviewed today include:  Review of the above records demonstrates:    Assessment and Plan:   1. Severe aortic stenosis s/p TAVR: F/u at [place] summer July 2021 with Echo showing LVEF=45-50% , unchanged but most recent echo May 2022 [facility] with LVEF=25-30%. (see below). She was seen in the hospital by our cardiology team and Dr. Percival Spanish discussed ischemic evaluation with the patient. She declined an ischemic evaluation then and does not wish to do so now. Will continue ASA, Eliquis and SBE prophylaxis.    2. CAD without angina: She has no chest pain. Mild to moderate CAD by cath in 2019. Drop in LVEF noted on echo May 2022 with LVEF=25-30%. I have discussed a stress test but she does not wish to pursue at this time. Given her age, we will most likely not plan ischemic testing in the future. Will continue ASA, statin and beta blocker.   3. Persistent atrial fibrillation: Rate controlled atrial fibrillation today. Will continue beta blocker and Eliquis.   4. HTN: BP is well controlled. No changes  5. Carotid artery disease:Followed at [facility]. Notes indicate right carotid artery stenting. No plans for further surveillance given age.   6. Tobacco abuse: She has stopped smoking  7. Chronic systolic CHF: For now, will continue Lasix 20 mg BID. Her volume status seems better today. She will follow daily weights at home. BMET today.   Current medicines are reviewed at length with the patient today.  The patient does not have concerns regarding medicines.  The following changes have been made:  no change  Labs/ tests ordered today include:   Orders Placed  This Encounter  Procedures  . Basic metabolic panel      Disposition:   F/U with me or office APP in 2-3 months.    Signed, Lauree Chandler, MD 01/08/2021 9:48 AM    [facility] [address], [place], [place]  [address] Phone: [phone]; Fax: [phone]

## 2021-01-08 ENCOUNTER — Other Ambulatory Visit: Payer: Self-pay

## 2021-01-08 ENCOUNTER — Encounter: Payer: Self-pay | Admitting: Cardiovascular Disease

## 2021-01-08 ENCOUNTER — Ambulatory Visit (INDEPENDENT_AMBULATORY_CARE_PROVIDER_SITE_OTHER): Payer: Medicare Other | Admitting: Cardiovascular Disease

## 2021-01-08 VITALS — BP 132/60 | HR 86 | Ht 60.0 in | Wt 149.8 lb

## 2021-01-08 DIAGNOSIS — I4821 Permanent atrial fibrillation: Secondary | ICD-10-CM

## 2021-01-08 DIAGNOSIS — I350 Nonrheumatic aortic (valve) stenosis: Secondary | ICD-10-CM | POA: Diagnosis not present

## 2021-01-08 DIAGNOSIS — I6523 Occlusion and stenosis of bilateral carotid arteries: Secondary | ICD-10-CM | POA: Diagnosis not present

## 2021-01-08 DIAGNOSIS — I10 Essential (primary) hypertension: Secondary | ICD-10-CM

## 2021-01-08 DIAGNOSIS — I5022 Chronic systolic (congestive) heart failure: Secondary | ICD-10-CM

## 2021-01-08 LAB — BASIC METABOLIC PANEL
BUN/Creatinine Ratio: 17 (ref 12–28)
BUN: 16 mg/dL (ref 10–36)
CO2: 24 mmol/L (ref 20–29)
Calcium: 9.1 mg/dL (ref 8.7–10.3)
Chloride: 99 mmol/L (ref 96–106)
Creatinine, Ser: 0.96 mg/dL (ref 0.57–1.00)
Glucose: 113 mg/dL — ABNORMAL HIGH (ref 65–99)
Potassium: 4.2 mmol/L (ref 3.5–5.2)
Sodium: 137 mmol/L (ref 134–144)
eGFR: 56 mL/min/{1.73_m2} — ABNORMAL LOW (ref >59)

## 2021-01-08 MED ORDER — POTASSIUM CHLORIDE CRYS ER 10 MEQ PO TBCR: 10.0000 meq | EXTENDED_RELEASE_TABLET | Freq: Every day | ORAL | 3 refills | Status: DC

## 2021-01-08 MED ORDER — FUROSEMIDE 20 MG PO TABS: 20.0000 mg | ORAL_TABLET | Freq: Two times a day (BID) | ORAL | 3 refills | Status: DC

## 2021-01-08 NOTE — Patient Instructions (Signed)
Medication Instructions:  No changes *If you need a refill on your cardiac medications before your next appointment, please call your pharmacy*   Lab Work: bmet today  If you have labs (blood work) drawn today and your tests are completely normal, you will receive your results only by: [name] MyChart Message (if you have MyChart) OR . A paper copy in the mail If you have any lab test that is abnormal or we need to change your treatment, we will call you to review the results.   Testing/Procedures: none   Follow-Up: At [facility], you and your health needs are our priority.  As part of our continuing mission to provide you with exceptional heart care, we have created designated Provider Care Teams.  These Care Teams include your primary Cardiologist (physician) and Advanced Practice Providers (APPs -  Physician Assistants and Nurse Practitioners) who all work together to provide you with the care you need, when you need it.  Your next appointment:   2-3 month(s)  The format for your next appointment:   In Person  Provider:   You may see Lauree Chandler, MD or one of the following Advanced Practice Providers on your designated Care Team:    Melina Copa, PA-C  Ermalinda Barrios, PA-C

## 2021-01-09 ENCOUNTER — Ambulatory Visit: Payer: Medicare Other | Admitting: Adult Health

## 2021-01-10 ENCOUNTER — Emergency Department (HOSPITAL_BASED_OUTPATIENT_CLINIC_OR_DEPARTMENT_OTHER)
Admission: EM | Admit: 2021-01-10 | Discharge: 2021-01-10 | Disposition: A | Discharge status: Home / Self Care | Payer: Medicare Other | Attending: Emergency Medicine | Admitting: Emergency Medicine

## 2021-01-10 ENCOUNTER — Encounter (HOSPITAL_BASED_OUTPATIENT_CLINIC_OR_DEPARTMENT_OTHER): Payer: Self-pay | Admitting: *Deleted

## 2021-01-10 ENCOUNTER — Other Ambulatory Visit: Payer: Self-pay

## 2021-01-10 ENCOUNTER — Emergency Department (HOSPITAL_BASED_OUTPATIENT_CLINIC_OR_DEPARTMENT_OTHER): Payer: Medicare Other

## 2021-01-10 DIAGNOSIS — R110 Nausea: Secondary | ICD-10-CM | POA: Insufficient documentation

## 2021-01-10 DIAGNOSIS — R600 Localized edema: Secondary | ICD-10-CM | POA: Insufficient documentation

## 2021-01-10 DIAGNOSIS — I110 Hypertensive heart disease with heart failure: Secondary | ICD-10-CM | POA: Diagnosis not present

## 2021-01-10 DIAGNOSIS — Z79899 Other long term (current) drug therapy: Secondary | ICD-10-CM | POA: Diagnosis not present

## 2021-01-10 DIAGNOSIS — E119 Type 2 diabetes mellitus without complications: Secondary | ICD-10-CM | POA: Insufficient documentation

## 2021-01-10 DIAGNOSIS — I509 Heart failure, unspecified: Secondary | ICD-10-CM | POA: Insufficient documentation

## 2021-01-10 DIAGNOSIS — Z7901 Long term (current) use of anticoagulants: Secondary | ICD-10-CM | POA: Diagnosis not present

## 2021-01-10 DIAGNOSIS — J449 Chronic obstructive pulmonary disease, unspecified: Secondary | ICD-10-CM | POA: Insufficient documentation

## 2021-01-10 DIAGNOSIS — Z87891 Personal history of nicotine dependence: Secondary | ICD-10-CM | POA: Diagnosis not present

## 2021-01-10 DIAGNOSIS — Z7982 Long term (current) use of aspirin: Secondary | ICD-10-CM | POA: Insufficient documentation

## 2021-01-10 DIAGNOSIS — Z20822 Contact with and (suspected) exposure to covid-19: Secondary | ICD-10-CM | POA: Diagnosis not present

## 2021-01-10 DIAGNOSIS — I2510 Atherosclerotic heart disease of native coronary artery without angina pectoris: Secondary | ICD-10-CM | POA: Insufficient documentation

## 2021-01-10 DIAGNOSIS — R0602 Shortness of breath: Secondary | ICD-10-CM | POA: Diagnosis present

## 2021-01-10 LAB — CBC WITH DIFFERENTIAL/PLATELET
Abs Immature Granulocytes: 0.02 10*3/uL (ref 0.00–0.07)
Basophils Absolute: 0 10*3/uL (ref 0.0–0.1)
Basophils Relative: 1 %
Eosinophils Absolute: 0.1 10*3/uL (ref 0.0–0.5)
Eosinophils Relative: 2 %
HCT: 33.5 % — ABNORMAL LOW (ref 36.0–46.0)
Hemoglobin: 11 g/dL — ABNORMAL LOW (ref 12.0–15.0)
Immature Granulocytes: 0 %
Lymphocytes Relative: 9 %
Lymphs Abs: 0.6 10*3/uL — ABNORMAL LOW (ref 0.7–4.0)
MCH: 29.2 pg (ref 26.0–34.0)
MCHC: 32.8 g/dL (ref 30.0–36.0)
MCV: 88.9 fL (ref 80.0–100.0)
Monocytes Absolute: 0.5 10*3/uL (ref 0.1–1.0)
Monocytes Relative: 7 %
Neutro Abs: 5.2 10*3/uL (ref 1.7–7.7)
Neutrophils Relative %: 81 %
Platelets: 282 10*3/uL (ref 150–400)
RBC: 3.77 MIL/uL — ABNORMAL LOW (ref 3.87–5.11)
RDW: 15.9 % — ABNORMAL HIGH (ref 11.5–15.5)
WBC: 6.4 10*3/uL (ref 4.0–10.5)
nRBC: 0 % (ref 0.0–0.2)

## 2021-01-10 LAB — RESP PANEL BY RT-PCR (FLU A&B, COVID) ARPGX2
Influenza A by PCR: NEGATIVE
Influenza B by PCR: NEGATIVE
SARS Coronavirus 2 by RT PCR: NEGATIVE

## 2021-01-10 LAB — HEPATIC FUNCTION PANEL
ALT: 11 U/L (ref 0–44)
AST: 18 U/L (ref 15–41)
Albumin: 3.6 g/dL (ref 3.5–5.0)
Alkaline Phosphatase: 44 U/L (ref 38–126)
Bilirubin, Direct: 0.3 mg/dL — ABNORMAL HIGH (ref 0.0–0.2)
Indirect Bilirubin: 0.7 mg/dL (ref 0.3–0.9)
Total Bilirubin: 1 mg/dL (ref 0.3–1.2)
Total Protein: 6 g/dL — ABNORMAL LOW (ref 6.5–8.1)

## 2021-01-10 LAB — BASIC METABOLIC PANEL
Anion gap: 10 (ref 5–15)
BUN: 24 mg/dL — ABNORMAL HIGH (ref 8–23)
CO2: 24 mmol/L (ref 22–32)
Calcium: 8.6 mg/dL — ABNORMAL LOW (ref 8.9–10.3)
Chloride: 104 mmol/L (ref 98–111)
Creatinine, Ser: 0.81 mg/dL (ref 0.44–1.00)
GFR, Estimated: >60 mL/min (ref >60)
Glucose, Bld: 133 mg/dL — ABNORMAL HIGH (ref 70–99)
Potassium: 3.7 mmol/L (ref 3.5–5.1)
Sodium: 138 mmol/L (ref 135–145)

## 2021-01-10 LAB — BRAIN NATRIURETIC PEPTIDE: B Natriuretic Peptide: 1752.8 pg/mL — ABNORMAL HIGH (ref 0.0–100.0)

## 2021-01-10 LAB — TROPONIN I (HIGH SENSITIVITY): Troponin I (High Sensitivity): 14 ng/L (ref <18)

## 2021-01-10 LAB — LIPASE, BLOOD: Lipase: 12 U/L (ref 11–51)

## 2021-01-10 MED ORDER — ONDANSETRON HCL 4 MG/2ML IJ SOLN
4.0000 mg | Freq: Once | INTRAMUSCULAR | Status: AC
  Administered 2021-01-10: 4 mg via INTRAVENOUS
  Filled 2021-01-10: qty 2

## 2021-01-10 MED ORDER — ONDANSETRON HCL 4 MG PO TABS
4.0000 mg | ORAL_TABLET | Freq: Four times a day (QID) | ORAL | 0 refills | Status: DC
Start: 2021-01-10 — End: 2021-01-31

## 2021-01-10 NOTE — ED Triage Notes (Signed)
Pt presents with sob/nausea. States she is unable to lay flat due to the sob. Pt was seen here one week ago and place on lasix bid and has followed up with cardiology this week. Pt also c/o feeling weak.

## 2021-01-10 NOTE — ED Provider Notes (Signed)
[facility]ERG[facility]Y DEPT Provider Note   CSN: [id number] Arrival date & time: 01/10/21  1916     History Chief Complaint  Patient presents with  . Shortness of Breath    [facility] Fields is a 87 y.o. female.  Patient with history of heart failure.  Patient has had some nausea since increasing her Lasix and starting potassium last week.  From a respiratory standpoint she is still mildly short of breath at times but overall think she is improving.  Mostly concerned about the nausea.  Denies any abdominal pain.  No vomiting or diarrhea.  Is passing gas.  Has had multiple abdominal surgeries.  Denies any chest pain, fever, chills.  The history is provided by the patient and a caregiver.  Shortness of Breath Severity:  Mild Onset quality:  Gradual Timing:  Intermittent Progression:  Waxing and waning Chronicity:  New Context: activity   Relieved by:  Nothing Worsened by:  Nothing Associated symptoms: no abdominal pain, no chest pain, no cough, no ear pain, no fever, no rash, no sore throat and no vomiting        Past Medical History:  Diagnosis Date  . Aortic stenosis    s/p TAVR 26 mm Edwards Sapien 3 THV July 2020  . CAD (coronary artery disease)   . COPD (chronic obstructive pulmonary disease) ([place])   . Diabetes mellitus ([place])   . Diverticulitis   . GERD (gastroesophageal reflux disease)   . HTN (hypertension)   . Hyperlipidemia   . Legally blind in left eye, as defined in [place]   . Tobacco abuse     Patient Active Problem List   Diagnosis Date Noted  . Cognitive deficits   . Pressure injury of skin 12/19/2020  . Acute exacerbation of CHF (congestive heart failure) ([place]) 12/18/2020  . CHF (congestive heart failure) ([place]) 12/18/2020  . COPD (chronic obstructive pulmonary disease) ([place]) 09/02/2020  . Dyslipidemia 09/02/2020  . Acute respiratory failure with hypoxia ([place]) 09/01/2020  . Pulmonary nodule 1 cm or greater in diameter 01/03/2020  . S/P  TAVR (transcatheter aortic valve replacement) 05/03/2019  . Primary osteoarthritis involving multiple joints 05/03/2019  . Other persistent atrial fibrillation ([place]) 05/03/2019  . Bilateral carotid artery stenosis 12/01/2017  . 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/13/2014  . Aortic valve disorder 05/03/2014  . Chronic obstructive bronchitis without exacerbation ([place]) 05/03/2014  . Derangement of left knee 05/03/2014  . Esophageal reflux 05/03/2014  . Essential hypertension 05/03/2014  . Pure hypercholesterolemia 05/03/2014  . Type II diabetes mellitus ([place]) 05/03/2014  . Vitamin D deficiency 05/03/2014    Past Surgical History:  Procedure Laterality Date  . ABDOMINAL HYSTERECTOMY    . APPENDECTOMY    . BACK SURGERY    . CATARACT EXTRACTION, BILATERAL    . CHOLECYSTECTOMY       OB History    Gravida  2   Para  2   Term      Preterm      AB      Living        SAB      IAB      Ectopic      Multiple      Live Births              Family History  Problem Relation Age of Onset  . Diabetes Mother   . Stroke Brother   . Cerebral palsy Brother   . Coronary artery disease Neg Hx  Social History   Tobacco Use  . Smoking status: Former Smoker    Packs/day: 1.00    Years: 70.00    Pack years: 70.00    Types: Cigarettes    Quit date: 12/15/2019    Years since quitting: 1.0  . Smokeless tobacco: Never Used  . Tobacco comment: Pt states quiting a year ago. 01/03/20 ARJ  Vaping Use  . Vaping Use: Never used  Substance Use Topics  . Alcohol use: No    Alcohol/week: 0.0 standard drinks  . Drug use: No    Home Medications Prior to Admission medications   Medication Sig Start Date End Date Taking? Authorizing Provider  ondansetron (ZOFRAN) 4 MG tablet Take 1 tablet (4 mg total) by mouth every 6 (six) hours. 01/10/21  Yes Sareen Randon, DO  acetaminophen (TYLENOL) 500 MG tablet Take 500 mg by mouth every 6 (six) hours as needed for mild pain (or  headaches).    [provider]  aspirin 81 MG chewable tablet Chew 1 tablet (81 mg total) by mouth daily. 04/01/20   Nafziger, Tommi Rumps, NP  atorvastatin (LIPITOR) 40 MG tablet TAKE 1 TABLET BY MOUTH DAILY AT 6 PM. 10/09/20   Nafziger, Tommi Rumps, NP  Cholecalciferol (VITAMIN D-3 PO) Take 1,000 Units by mouth 2 (two) times daily.    [provider]  Coenzyme Q10 (CO Q-10) 300 MG CAPS Take 300 mg by mouth in the morning.    [provider]  ELIQUIS 5 MG TABS tablet TAKE 1 TABLET BY MOUTH TWICE A DAY 10/22/20   Nafziger, Tommi Rumps, NP  fenofibrate 160 MG tablet TAKE 1 TABLET BY MOUTH EVERY DAY 10/22/20   Nafziger, Tommi Rumps, NP  ferrous gluconate (FERGON) 324 MG tablet Take 1 tablet (324 mg total) by mouth daily with breakfast. 10/09/20   Nafziger, Tommi Rumps, NP  furosemide (LASIX) 20 MG tablet Take 1 tablet (20 mg total) by mouth 2 (two) times daily. 01/08/21   Burnell Blanks, MD  losartan (COZAAR) 100 MG tablet TAKE 1 TABLET BY MOUTH EVERY DAY 11/10/20   Burnell Blanks, MD  metoprolol tartrate (LOPRESSOR) 50 MG tablet TAKE 1 TABLET BY MOUTH TWICE A DAY 10/22/20   Nafziger, Tommi Rumps, NP  Multiple Vitamins-Minerals (CENTRUM SILVER 50+WOMEN) TABS Take 1 tablet by mouth daily with breakfast.    [provider]  NASAL SPRAY SALINE NA Place 1 spray into both nostrils as needed (for congestion).    [provider]  Omega-3 Fatty Acids (FISH OIL) 1200 MG CAPS Take 1,200 mg by mouth in the morning.    [provider]  potassium chloride (KLOR-CON) 10 MEQ tablet Take 1 tablet (10 mEq total) by mouth daily. 01/08/21   Burnell Blanks, MD  psyllium (METAMUCIL) 58.6 % powder Take 1 packet by mouth daily as needed (for constipation).    [provider]  terazosin (HYTRIN) 10 MG capsule Take 1 capsule (10 mg total) by mouth daily at 12 noon. 12/16/20   Nafziger, Tommi Rumps, NP  traMADol (ULTRAM) 50 MG tablet TAKE 1 TABLET (50 MG TOTAL) BY MOUTH IN THE MORNING, AT NOON, AND  AT BEDTIME. 10/03/20   Nafziger, Tommi Rumps, NP  traZODone (DESYREL) 50 MG tablet Take 0.5 tablets (25 mg total) by mouth at bedtime. 12/21/20 01/20/21  Elodia Florence., MD    Allergies    Amlodipine besy-benazepril hcl, Hctz [hydrochlorothiazide], Other, Cyclobenzaprine, Diclofenac sodium, Hydralazine, Methylprednisolone, and Oxycodone  Review of Systems   Review of Systems  Constitutional: Negative for chills  and fever.  HENT: Negative for ear pain and sore throat.   Eyes: Negative for pain and visual disturbance.  Respiratory: Positive for shortness of breath. Negative for cough.   Cardiovascular: Negative for chest pain and palpitations.  Gastrointestinal: Positive for nausea. Negative for abdominal distention, abdominal pain, diarrhea and vomiting.  Genitourinary: Negative for dysuria and hematuria.  Musculoskeletal: Negative for arthralgias and back pain.  Skin: Negative for color change and rash.  Neurological: Negative for seizures and syncope.  All other systems reviewed and are negative.   Physical Exam Updated Vital Signs  ED Triage Vitals  Enc Vitals Group     BP 01/10/21 1930 140/84     Pulse Rate 01/10/21 1930 85     Resp 01/10/21 1930 20     Temp 01/10/21 1930 98.6 F (37 C)     Temp Source 01/10/21 1930 Oral     SpO2 01/10/21 1930 95 %     Weight 01/10/21 1930 150 lb (68 kg)     Height 01/10/21 1930 5' (1.524 m)     Head Circumference --      Peak Flow --      Pain Score 01/10/21 1932 0     Pain Loc --      Pain Edu? --      Excl. in [place]? --     Physical Exam Vitals and nursing note reviewed.  Constitutional:      General: She is not in acute distress.    Appearance: She is well-developed. She is not ill-appearing.  HENT:     Head: Normocephalic and atraumatic.  Eyes:     Conjunctiva/sclera: Conjunctivae normal.     Pupils: Pupils are equal, round, and reactive to light.  Cardiovascular:     Rate and Rhythm: Normal rate and regular rhythm.      Pulses: Normal pulses.     Heart sounds: Normal heart sounds. No murmur heard.   Pulmonary:     Effort: Pulmonary effort is normal. No respiratory distress.     Breath sounds: Rales present.  Abdominal:     Palpations: Abdomen is soft. There is no mass.     Tenderness: There is no abdominal tenderness. There is no guarding or rebound.  Musculoskeletal:     Cervical back: Normal range of motion and neck supple.     Right lower leg: Edema present.     Left lower leg: Edema present.  Skin:    General: Skin is warm and dry.     Capillary Refill: Capillary refill takes less than 2 seconds.  Neurological:     General: No focal deficit present.     Mental Status: She is alert.     ED Results / Procedures / Treatments   Labs (all labs ordered are listed, but only abnormal results are displayed) Labs Reviewed  CBC WITH DIFFERENTIAL/PLATELET - Abnormal; Notable for the following components:      Result Value   RBC 3.77 (*)    Hemoglobin 11.0 (*)    HCT 33.5 (*)    RDW 15.9 (*)    Lymphs Abs 0.6 (*)    All other components within normal limits  BASIC METABOLIC PANEL - Abnormal; Notable for the following components:   Glucose, Bld 133 (*)    BUN 24 (*)    Calcium 8.6 (*)    All other components within normal limits  HEPATIC FUNCTION PANEL - Abnormal; Notable for the following components:   Total Protein 6.0 (*)  Bilirubin, Direct 0.3 (*)    All other components within normal limits  BRAIN NATRIURETIC PEPTIDE - Abnormal; Notable for the following components:   B Natriuretic Peptide 1,752.8 (*)    All other components within normal limits  RESP PANEL BY RT-PCR (FLU A&B, COVID) ARPGX2  LIPASE, BLOOD  TROPONIN I (HIGH SENSITIVITY)    EKG EKG Interpretation  Date/Time:  Saturday Jan 10 2021 20:33:13 EDT Ventricular Rate:  79 PR Interval:    QRS Duration: 172 QT Interval:  449 QTC Calculation: 515 R Axis:   266 Text Interpretation: Atrial fibrillation Left bundle branch  block Confirmed by Lennice Sites ([id number]) on 01/10/2021 8:50:00 PM   Radiology DG Chest Portable 1 View  Result Date: 01/10/2021 CLINICAL DATA:  Shortness of breath. EXAM: PORTABLE CHEST 1 VIEW COMPARISON:  Jan 04, 2021 FINDINGS: Stable enlarged cardiac silhouette.  Aortic valve prosthesis. Diffuse bilateral interstitial thickening. Streaky posttreatment scarring in the left upper lobe is unchanged. Probable bilateral subpulmonic effusions. Osseous structures are without acute abnormality. Soft tissues are grossly normal. IMPRESSION: 1. Enlarged cardiac silhouette with interstitial pulmonary edema. 2. Probable bilateral subpulmonic effusions. Electronically Signed   By: Fidela Salisbury M.D.   On: 01/10/2021 20:11    Procedures Procedures   Medications Ordered in ED Medications  ondansetron (ZOFRAN) injection 4 mg (4 mg Intravenous Given 01/10/21 2038)    ED Course  I have reviewed the triage vital signs and the nursing notes.  Pertinent labs & imaging results that were available during my care of the patient were reviewed by me and considered in my medical decision making (see chart for details).    MDM Rules/Calculators/A&P                          [facility] Fields is here with nausea.  Has had some shortness of breath issues recently as well.  Recent changes to her Lasix.  Denies any chest pain, diarrhea, vomiting.  History of abdominal surgeries.  Vital signs overall unremarkable.  Since increased dose of Lasix overall she feels improved.  She denies any chest pain.  Mainly concerned about nausea today and suspect that it could be from potassium use recently.  Overall she appears comfortable.  No real focal abdominal pain on exam.  Lab work showed no significant anemia, electrolyte ab, kidney injury, leukocytosis.  Chest x-ray showed no signs of infection.  Appears to have stable edema.  Troponin and BNP at baseline.  No increased work of breathing.  Overall feels like she is improved  from respiratory standpoint from last week.  Lipase is normal doubt pancreatitis.  Symptoms are not quite consistent with small bowel obstruction. No UTI symptoms. She has already had her gallbladder removed and overall have low suspicion for gallbladder issue.  Patient at this time does not want to proceed CT scan and I think that is reasonable.  Will prescribe Zofran and they understand return precautions.  Discharged in ED in good condition.  Recommend follow-up with primary care doctor and cardiology.  This chart was dictated using voice recognition software.  Despite best efforts to proofread,  errors can occur which can change the documentation meaning.    Final Clinical Impression(s) / ED Diagnoses Final diagnoses:  Nausea  SOB (shortness of breath)    Rx / DC Orders ED Discharge Orders         Ordered    ondansetron (ZOFRAN) 4 MG tablet  Every 6 hours  01/10/21 2133           Lennice Sites, DO 01/10/21 2137

## 2021-01-10 NOTE — Discharge Instructions (Addendum)
Follow-up with primary care doctor and cardiology.  Please return to the ED if symptoms worsen as discussed.

## 2021-01-13 ENCOUNTER — Other Ambulatory Visit: Payer: Self-pay | Admitting: Cardiovascular Disease

## 2021-01-15 ENCOUNTER — Other Ambulatory Visit: Payer: Self-pay

## 2021-01-16 ENCOUNTER — Ambulatory Visit (INDEPENDENT_AMBULATORY_CARE_PROVIDER_SITE_OTHER): Payer: Medicare Other | Admitting: Adult Health

## 2021-01-16 ENCOUNTER — Encounter: Payer: Self-pay | Admitting: Adult Health

## 2021-01-16 VITALS — BP 150/80 | HR 85 | Temp 98.1°F | Ht 60.0 in | Wt 146.0 lb

## 2021-01-16 DIAGNOSIS — I5023 Acute on chronic systolic (congestive) heart failure: Secondary | ICD-10-CM

## 2021-01-16 DIAGNOSIS — R110 Nausea: Secondary | ICD-10-CM

## 2021-01-16 DIAGNOSIS — I6523 Occlusion and stenosis of bilateral carotid arteries: Secondary | ICD-10-CM | POA: Diagnosis not present

## 2021-01-16 NOTE — Progress Notes (Signed)
Subjective:    Patient ID: Tara Fields, female    DOB: 03/20/1928, 87 y.o.   MRN: [id number]  HPI  87 year old female who  has a past medical history of Aortic stenosis, CAD (coronary artery disease), COPD (chronic obstructive pulmonary disease) ([place]), Diabetes mellitus ([place]), Diverticulitis, GERD (gastroesophageal reflux disease), HTN (hypertension), Hyperlipidemia, Legally blind in left eye, as defined in [place], and Tobacco abuse.   She presents to the office today with son after being seen in the ER on 01/10/2021 for nausea.  Her nausea started after increasing Lasix and starting potassium the week before.  He denied abdominal pain, vomiting or diarrhea.  Cup in the ER was reassuring and it was thought that her nausea could have been from potassium supplementation.  She reports that she has not taken any potassium in the last week but continues to have nausea from time to time.  There was Zofran that was she was given in the emergency room caused her to fall asleep.  He denies abdominal pain, vomiting, or diarrhea.  She also does report fatigue which has also been an ongoing issue.  Her breathing is at baseline and she has noticed improvement in both breathing and lower extremity edema with the increase Lasix   Review of Systems See HPI   Past Medical History:  Diagnosis Date  . Aortic stenosis    s/p TAVR 26 mm Edwards Sapien 3 THV July 2020  . CAD (coronary artery disease)   . COPD (chronic obstructive pulmonary disease) ([place])   . Diabetes mellitus ([place])   . Diverticulitis   . GERD (gastroesophageal reflux disease)   . HTN (hypertension)   . Hyperlipidemia   . Legally blind in left eye, as defined in [place]   . Tobacco abuse     Social History   Socioeconomic History  . Marital status: Widowed    Spouse name: Not on file  . Number of children: 2  . Years of education: Not on file  . Highest education level: High school graduate  Occupational History  . Occupation: Retired     Comment: homemaker  Tobacco Use  . Smoking status: Former Smoker    Packs/day: 1.00    Years: 70.00    Pack years: 70.00    Types: Cigarettes    Quit date: 12/15/2019    Years since quitting: 1.0  . Smokeless tobacco: Never Used  . Tobacco comment: Pt states quiting a year ago. 01/03/20 [facility]  Vaping Use  . Vaping Use: Never used  Substance and Sexual Activity  . Alcohol use: No    Alcohol/week: 0.0 standard drinks  . Drug use: No  . Sexual activity: Not Currently  Other Topics Concern  . Not on file  Social History Narrative   Widowed- was married for 50 years    Has two sons       Social Determinants of Health   Financial Resource Strain: Low Risk   . Difficulty of Paying Living Expenses: Not hard at all  Food Insecurity: No Food Insecurity  . Worried About Charity fundraiser in the Last Year: Never true  . Ran Out of Food in the Last Year: Never true  Transportation Needs: No Transportation Needs  . Lack of Transportation (Medical): No  . Lack of Transportation (Non-Medical): No  Physical Activity: Not on file  Stress: Not on file  Social Connections: Not on file  Intimate Partner Violence: Not on file    Past Surgical History:  Procedure Laterality Date  . ABDOMINAL HYSTERECTOMY    . APPENDECTOMY    . BACK SURGERY    . CATARACT EXTRACTION, BILATERAL    . CHOLECYSTECTOMY      Family History  Problem Relation Age of Onset  . Diabetes Mother   . Stroke Brother   . Cerebral palsy Brother   . Coronary artery disease Neg Hx     Allergies  Allergen Reactions  . Amlodipine Besy-Benazepril Hcl Cough  . Hctz [Hydrochlorothiazide] Other (See Comments)    Per family, every diuretic the patient has been on flushes out her out and ultimately results in CRAMPING/JAUNDICE (added potassium might help)  . Other Other (See Comments)    History of diverticulitis- CANNOT TOLERATE NUTS, CORN, AND ANY FOODS NOT EASILY DIGESTED- the patient AVOIDS these  . Cyclobenzaprine  Other (See Comments)    Confusion- does not tolerate well   . Diclofenac Sodium Diarrhea  . Hydralazine Other (See Comments)    Per family, every diuretic the patient has been on flushes out her out and ultimately results in CRAMPING/JAUNDICE (added potassium might help)  . Methylprednisolone Other (See Comments)    Medrol/CORTICOSTEROIDS = A LOT OF ENERGY  . Oxycodone Other (See Comments)    Caused patient to "act crazy"    Current Outpatient Medications on File Prior to Visit  Medication Sig Dispense Refill  . acetaminophen (TYLENOL) 500 MG tablet Take 500 mg by mouth every 6 (six) hours as needed for mild pain (or headaches).    [name] aspirin 81 MG chewable tablet Chew 1 tablet (81 mg total) by mouth daily. 90 tablet 3  . atorvastatin (LIPITOR) 40 MG tablet TAKE 1 TABLET BY MOUTH DAILY AT 6 PM. 90 tablet 1  . Cholecalciferol (VITAMIN D-3 PO) Take 1,000 Units by mouth 2 (two) times daily.    . Coenzyme Q10 (CO Q-10) 300 MG CAPS Take 300 mg by mouth in the morning.    [name] ELIQUIS 5 MG TABS tablet TAKE 1 TABLET BY MOUTH TWICE A DAY 180 tablet 1  . fenofibrate 160 MG tablet TAKE 1 TABLET BY MOUTH EVERY DAY 90 tablet 1  . ferrous gluconate (FERGON) 324 MG tablet Take 1 tablet (324 mg total) by mouth daily with breakfast. 90 tablet 1  . furosemide (LASIX) 20 MG tablet Take 1 tablet (20 mg total) by mouth 2 (two) times daily. 180 tablet 3  . losartan (COZAAR) 100 MG tablet TAKE 1 TABLET BY MOUTH EVERY DAY 90 tablet 3  . metoprolol tartrate (LOPRESSOR) 50 MG tablet TAKE 1 TABLET BY MOUTH TWICE A DAY 180 tablet 1  . Multiple Vitamins-Minerals (CENTRUM SILVER 50+WOMEN) TABS Take 1 tablet by mouth daily with breakfast.    . NASAL SPRAY SALINE NA Place 1 spray into both nostrils as needed (for congestion).    . Omega-3 Fatty Acids (FISH OIL) 1200 MG CAPS Take 1,200 mg by mouth in the morning.    . ondansetron (ZOFRAN) 4 MG tablet Take 1 tablet (4 mg total) by mouth every 6 (six) hours. 12 tablet 0  .  potassium chloride (KLOR-CON) 10 MEQ tablet Take 1 tablet (10 mEq total) by mouth daily. 90 tablet 3  . psyllium (METAMUCIL) 58.6 % powder Take 1 packet by mouth daily as needed (for constipation).    . terazosin (HYTRIN) 10 MG capsule Take 1 capsule (10 mg total) by mouth daily at 12 noon. 90 capsule 1  . traMADol (ULTRAM) 50 MG tablet TAKE 1 TABLET (50 MG  TOTAL) BY MOUTH IN THE MORNING, AT NOON, AND AT BEDTIME. 90 tablet 2  . traZODone (DESYREL) 50 MG tablet Take 0.5 tablets (25 mg total) by mouth at bedtime. 15 tablet 0   No current facility-administered medications on file prior to visit.    BP (!) 150/80   Pulse 85   Temp 98.1 F (36.7 C) (Oral)   Ht 5' (1.524 m)   Wt 146 lb (66.2 kg)   SpO2 96%   BMI 28.51 kg/m       Objective:   Physical Exam Vitals and nursing note reviewed.  Constitutional:      Appearance: Normal appearance.  Pulmonary:     Effort: Pulmonary effort is normal.     Breath sounds: Normal breath sounds.  Musculoskeletal:     Right lower leg: No edema.     Left lower leg: No edema.  Skin:    General: Skin is warm and dry.  Neurological:     General: No focal deficit present.     Mental Status: She is alert and oriented to person, place, and time.  Psychiatric:        Mood and Affect: Mood normal.        Behavior: Behavior normal.        Thought Content: Thought content normal.        Judgment: Judgment normal.       Assessment & Plan:  1. Nausea -Likely not from potassium since she has not taken it in a week.  Possibly from increased Lasix dosage.  Fortunately, due to worsening heart failure she does need to take her Lasix.  I do not feel comfortable giving her any other antinausea medication at this time due to the sedating effects of Zofran with her.  We will recheck her BMP today and she will restart potassium taking it every other day to start off with.  We will check her potassium again next week.  2. Acute on chronic systolic congestive  heart failure ([place]) - appears euvolemic today - Basic Metabolic Panel; Future - Basic Metabolic Panel   [name], NP

## 2021-01-19 LAB — BASIC METABOLIC PANEL
BUN: 25 mg/dL — ABNORMAL HIGH (ref 6–23)
CO2: 27 mEq/L (ref 19–32)
Calcium: 9.2 mg/dL (ref 8.4–10.5)
Chloride: 104 mEq/L (ref 96–112)
Creatinine, Ser: 0.77 mg/dL (ref 0.40–1.20)
GFR: 67.67 mL/min (ref >60.00)
Glucose, Bld: 110 mg/dL — ABNORMAL HIGH (ref 70–99)
Potassium: 3.4 mEq/L — ABNORMAL LOW (ref 3.5–5.1)
Sodium: 144 mEq/L (ref 135–145)

## 2021-01-21 ENCOUNTER — Other Ambulatory Visit (INDEPENDENT_AMBULATORY_CARE_PROVIDER_SITE_OTHER): Payer: Medicare Other

## 2021-01-21 ENCOUNTER — Other Ambulatory Visit: Payer: Self-pay

## 2021-01-21 DIAGNOSIS — I5023 Acute on chronic systolic (congestive) heart failure: Secondary | ICD-10-CM | POA: Diagnosis not present

## 2021-01-21 LAB — BASIC METABOLIC PANEL
BUN: 23 mg/dL (ref 6–23)
CO2: 29 mEq/L (ref 19–32)
Calcium: 9.1 mg/dL (ref 8.4–10.5)
Chloride: 104 mEq/L (ref 96–112)
Creatinine, Ser: 0.84 mg/dL (ref 0.40–1.20)
GFR: 60.96 mL/min (ref >60.00)
Glucose, Bld: 123 mg/dL — ABNORMAL HIGH (ref 70–99)
Potassium: 3.2 mEq/L — ABNORMAL LOW (ref 3.5–5.1)
Sodium: 141 mEq/L (ref 135–145)

## 2021-01-29 ENCOUNTER — Encounter (HOSPITAL_BASED_OUTPATIENT_CLINIC_OR_DEPARTMENT_OTHER): Payer: Self-pay | Admitting: Emergency Medicine

## 2021-01-29 ENCOUNTER — Other Ambulatory Visit: Payer: Self-pay

## 2021-01-29 ENCOUNTER — Ambulatory Visit: Payer: Medicare Other | Admitting: Family

## 2021-01-29 ENCOUNTER — Inpatient Hospital Stay (HOSPITAL_BASED_OUTPATIENT_CLINIC_OR_DEPARTMENT_OTHER)
Admission: EM | Admit: 2021-01-29 | Discharge: 2021-01-31 | DRG: 291 | Disposition: A | Discharge status: Home Health | Payer: Medicare Other | Attending: Internal Medicine | Admitting: Internal Medicine

## 2021-01-29 ENCOUNTER — Emergency Department (HOSPITAL_BASED_OUTPATIENT_CLINIC_OR_DEPARTMENT_OTHER): Payer: Medicare Other

## 2021-01-29 DIAGNOSIS — K219 Gastro-esophageal reflux disease without esophagitis: Secondary | ICD-10-CM | POA: Diagnosis present

## 2021-01-29 DIAGNOSIS — Z952 Presence of prosthetic heart valve: Secondary | ICD-10-CM | POA: Diagnosis not present

## 2021-01-29 DIAGNOSIS — I10 Essential (primary) hypertension: Secondary | ICD-10-CM | POA: Diagnosis present

## 2021-01-29 DIAGNOSIS — Z90710 Acquired absence of both cervix and uterus: Secondary | ICD-10-CM

## 2021-01-29 DIAGNOSIS — Z885 Allergy status to narcotic agent status: Secondary | ICD-10-CM | POA: Diagnosis not present

## 2021-01-29 DIAGNOSIS — Z20822 Contact with and (suspected) exposure to covid-19: Secondary | ICD-10-CM | POA: Diagnosis present

## 2021-01-29 DIAGNOSIS — J449 Chronic obstructive pulmonary disease, unspecified: Secondary | ICD-10-CM | POA: Diagnosis present

## 2021-01-29 DIAGNOSIS — I4819 Other persistent atrial fibrillation: Secondary | ICD-10-CM | POA: Diagnosis present

## 2021-01-29 DIAGNOSIS — Z833 Family history of diabetes mellitus: Secondary | ICD-10-CM

## 2021-01-29 DIAGNOSIS — E785 Hyperlipidemia, unspecified: Secondary | ICD-10-CM | POA: Diagnosis present

## 2021-01-29 DIAGNOSIS — Z87891 Personal history of nicotine dependence: Secondary | ICD-10-CM | POA: Diagnosis not present

## 2021-01-29 DIAGNOSIS — M7989 Other specified soft tissue disorders: Secondary | ICD-10-CM | POA: Diagnosis present

## 2021-01-29 DIAGNOSIS — I2510 Atherosclerotic heart disease of native coronary artery without angina pectoris: Secondary | ICD-10-CM | POA: Diagnosis present

## 2021-01-29 DIAGNOSIS — I110 Hypertensive heart disease with heart failure: Principal | ICD-10-CM | POA: Diagnosis present

## 2021-01-29 DIAGNOSIS — E119 Type 2 diabetes mellitus without complications: Secondary | ICD-10-CM | POA: Diagnosis present

## 2021-01-29 DIAGNOSIS — I350 Nonrheumatic aortic (valve) stenosis: Secondary | ICD-10-CM | POA: Diagnosis present

## 2021-01-29 DIAGNOSIS — Z888 Allergy status to other drugs, medicaments and biological substances status: Secondary | ICD-10-CM

## 2021-01-29 DIAGNOSIS — I5023 Acute on chronic systolic (congestive) heart failure: Secondary | ICD-10-CM | POA: Diagnosis present

## 2021-01-29 DIAGNOSIS — Z9049 Acquired absence of other specified parts of digestive tract: Secondary | ICD-10-CM | POA: Diagnosis not present

## 2021-01-29 DIAGNOSIS — Z7982 Long term (current) use of aspirin: Secondary | ICD-10-CM

## 2021-01-29 DIAGNOSIS — H548 Legal blindness, as defined in USA: Secondary | ICD-10-CM | POA: Diagnosis present

## 2021-01-29 DIAGNOSIS — Z7901 Long term (current) use of anticoagulants: Secondary | ICD-10-CM

## 2021-01-29 DIAGNOSIS — Z79899 Other long term (current) drug therapy: Secondary | ICD-10-CM | POA: Diagnosis not present

## 2021-01-29 DIAGNOSIS — I509 Heart failure, unspecified: Secondary | ICD-10-CM | POA: Diagnosis not present

## 2021-01-29 DIAGNOSIS — I359 Nonrheumatic aortic valve disorder, unspecified: Secondary | ICD-10-CM | POA: Diagnosis present

## 2021-01-29 LAB — COMPREHENSIVE METABOLIC PANEL
ALT: 13 U/L (ref 0–44)
AST: 24 U/L (ref 15–41)
Albumin: 3.8 g/dL (ref 3.5–5.0)
Alkaline Phosphatase: 37 U/L — ABNORMAL LOW (ref 38–126)
Anion gap: 13 (ref 5–15)
BUN: 22 mg/dL (ref 8–23)
CO2: 23 mmol/L (ref 22–32)
Calcium: 9.1 mg/dL (ref 8.9–10.3)
Chloride: 104 mmol/L (ref 98–111)
Creatinine, Ser: 0.81 mg/dL (ref 0.44–1.00)
GFR, Estimated: >60 mL/min (ref >60)
Glucose, Bld: 156 mg/dL — ABNORMAL HIGH (ref 70–99)
Potassium: 3.6 mmol/L (ref 3.5–5.1)
Sodium: 140 mmol/L (ref 135–145)
Total Bilirubin: 1.1 mg/dL (ref 0.3–1.2)
Total Protein: 6.7 g/dL (ref 6.5–8.1)

## 2021-01-29 LAB — CBC WITH DIFFERENTIAL/PLATELET
Abs Immature Granulocytes: 0.03 10*3/uL (ref 0.00–0.07)
Basophils Absolute: 0 10*3/uL (ref 0.0–0.1)
Basophils Relative: 1 %
Eosinophils Absolute: 0.1 10*3/uL (ref 0.0–0.5)
Eosinophils Relative: 1 %
HCT: 34.2 % — ABNORMAL LOW (ref 36.0–46.0)
Hemoglobin: 10.7 g/dL — ABNORMAL LOW (ref 12.0–15.0)
Immature Granulocytes: 1 %
Lymphocytes Relative: 6 %
Lymphs Abs: 0.4 10*3/uL — ABNORMAL LOW (ref 0.7–4.0)
MCH: 27.9 pg (ref 26.0–34.0)
MCHC: 31.3 g/dL (ref 30.0–36.0)
MCV: 89.3 fL (ref 80.0–100.0)
Monocytes Absolute: 0.3 10*3/uL (ref 0.1–1.0)
Monocytes Relative: 4 %
Neutro Abs: 5.8 10*3/uL (ref 1.7–7.7)
Neutrophils Relative %: 87 %
Platelets: 345 10*3/uL (ref 150–400)
RBC: 3.83 MIL/uL — ABNORMAL LOW (ref 3.87–5.11)
RDW: 15.9 % — ABNORMAL HIGH (ref 11.5–15.5)
WBC: 6.6 10*3/uL (ref 4.0–10.5)
nRBC: 0 % (ref 0.0–0.2)

## 2021-01-29 LAB — TROPONIN I (HIGH SENSITIVITY)
Troponin I (High Sensitivity): 16 ng/L (ref <18)
Troponin I (High Sensitivity): 17 ng/L (ref <18)

## 2021-01-29 LAB — RESP PANEL BY RT-PCR (FLU A&B, COVID) ARPGX2
Influenza A by PCR: NEGATIVE
Influenza B by PCR: NEGATIVE
SARS Coronavirus 2 by RT PCR: NEGATIVE

## 2021-01-29 LAB — BRAIN NATRIURETIC PEPTIDE: B Natriuretic Peptide: 1896.8 pg/mL — ABNORMAL HIGH (ref 0.0–100.0)

## 2021-01-29 MED ORDER — APIXABAN 5 MG PO TABS
5.0000 mg | ORAL_TABLET | Freq: Two times a day (BID) | ORAL | Status: DC
  Administered 2021-01-29 – 2021-01-31 (×5): 5 mg via ORAL
  Filled 2021-01-29 (×5): qty 1

## 2021-01-29 MED ORDER — TERAZOSIN HCL 5 MG PO CAPS
10.0000 mg | ORAL_CAPSULE | Freq: Every day | ORAL | Status: DC
  Administered 2021-01-29 – 2021-01-30 (×2): 10 mg via ORAL
  Administered 2021-01-31: 5 mg via ORAL
  Filled 2021-01-29 (×3): qty 2

## 2021-01-29 MED ORDER — SODIUM CHLORIDE 0.9% FLUSH
3.0000 mL | Freq: Two times a day (BID) | INTRAVENOUS | Status: DC
  Administered 2021-01-29 – 2021-01-31 (×4): 3 mL via INTRAVENOUS

## 2021-01-29 MED ORDER — FERROUS GLUCONATE 324 (38 FE) MG PO TABS
324.0000 mg | ORAL_TABLET | Freq: Every day | ORAL | Status: DC
  Administered 2021-01-30 – 2021-01-31 (×2): 324 mg via ORAL
  Filled 2021-01-29 (×2): qty 1

## 2021-01-29 MED ORDER — METOPROLOL TARTRATE 50 MG PO TABS
50.0000 mg | ORAL_TABLET | Freq: Two times a day (BID) | ORAL | Status: DC
  Administered 2021-01-29 – 2021-01-31 (×5): 50 mg via ORAL
  Filled 2021-01-29 (×6): qty 1

## 2021-01-29 MED ORDER — ATORVASTATIN CALCIUM 40 MG PO TABS
40.0000 mg | ORAL_TABLET | Freq: Every day | ORAL | Status: DC
  Administered 2021-01-29 – 2021-01-31 (×3): 40 mg via ORAL
  Filled 2021-01-29 (×3): qty 1

## 2021-01-29 MED ORDER — POTASSIUM CHLORIDE CRYS ER 10 MEQ PO TBCR
10.0000 meq | EXTENDED_RELEASE_TABLET | Freq: Every day | ORAL | Status: DC
  Administered 2021-01-29 – 2021-01-31 (×3): 10 meq via ORAL
  Filled 2021-01-29 (×3): qty 1

## 2021-01-29 MED ORDER — ONDANSETRON HCL 4 MG/2ML IJ SOLN: 4.0000 mg | Freq: Four times a day (QID) | INTRAMUSCULAR | Status: DC | PRN

## 2021-01-29 MED ORDER — ACETAMINOPHEN 500 MG PO TABS
500.0000 mg | ORAL_TABLET | Freq: Four times a day (QID) | ORAL | Status: DC | PRN
  Administered 2021-01-31: 500 mg via ORAL
  Filled 2021-01-29: qty 1

## 2021-01-29 MED ORDER — POTASSIUM CHLORIDE CRYS ER 20 MEQ PO TBCR
40.0000 meq | EXTENDED_RELEASE_TABLET | Freq: Once | ORAL | Status: AC
  Administered 2021-01-29: 40 meq via ORAL
  Filled 2021-01-29: qty 2

## 2021-01-29 MED ORDER — SODIUM CHLORIDE 0.9% FLUSH: 3.0000 mL | INTRAVENOUS | Status: DC | PRN

## 2021-01-29 MED ORDER — FENOFIBRATE 160 MG PO TABS
160.0000 mg | ORAL_TABLET | Freq: Every day | ORAL | Status: DC
  Administered 2021-01-29 – 2021-01-31 (×3): 160 mg via ORAL
  Filled 2021-01-29 (×3): qty 1

## 2021-01-29 MED ORDER — ONDANSETRON HCL 4 MG PO TABS: 4.0000 mg | ORAL_TABLET | Freq: Four times a day (QID) | ORAL | Status: DC | PRN

## 2021-01-29 MED ORDER — TRAZODONE HCL 50 MG PO TABS
25.0000 mg | ORAL_TABLET | Freq: Every day | ORAL | Status: DC
  Administered 2021-01-30: 25 mg via ORAL
  Filled 2021-01-29 (×2): qty 1

## 2021-01-29 MED ORDER — TRAMADOL HCL 50 MG PO TABS: 50.0000 mg | ORAL_TABLET | Freq: Three times a day (TID) | ORAL | Status: DC | PRN

## 2021-01-29 MED ORDER — ASPIRIN 81 MG PO CHEW
81.0000 mg | CHEWABLE_TABLET | Freq: Every day | ORAL | Status: DC
  Administered 2021-01-29 – 2021-01-31 (×3): 81 mg via ORAL
  Filled 2021-01-29 (×3): qty 1

## 2021-01-29 MED ORDER — FUROSEMIDE 10 MG/ML IJ SOLN
20.0000 mg | Freq: Two times a day (BID) | INTRAMUSCULAR | Status: DC
  Administered 2021-01-29 – 2021-01-31 (×4): 20 mg via INTRAVENOUS
  Filled 2021-01-29 (×4): qty 2

## 2021-01-29 MED ORDER — LOSARTAN POTASSIUM 50 MG PO TABS
100.0000 mg | ORAL_TABLET | Freq: Every day | ORAL | Status: DC
  Administered 2021-01-29 – 2021-01-31 (×3): 100 mg via ORAL
  Filled 2021-01-29 (×3): qty 2

## 2021-01-29 MED ORDER — FUROSEMIDE 10 MG/ML IJ SOLN
40.0000 mg | Freq: Once | INTRAMUSCULAR | Status: AC
  Administered 2021-01-29: 40 mg via INTRAVENOUS
  Filled 2021-01-29: qty 4

## 2021-01-29 MED ORDER — SODIUM CHLORIDE 0.9 % IV SOLN: 250.0000 mL | INTRAVENOUS | Status: DC | PRN

## 2021-01-29 NOTE — ED Triage Notes (Signed)
  Patient comes in with SOB/nausea that has been going on for two days.  Patient has CHF and has had issues with her lasix and potassium levels.  Patient feels nauseous but has not thrown up.  Patient endorses achy abdominal pain that is intermittent with the SOB.  Pain 5/10.

## 2021-01-29 NOTE — ED Provider Notes (Signed)
I received this patient in signout from Dr. Betsey Holiday.  Patient with history of CHF presented with shortness of breath, leg edema, and new hypoxia with concern for congestive heart failure.  Chest x-ray with edema suggestive of heart failure exacerbation.  At time of signout, awaiting completion of lab work with anticipation of admission.  Lab work shows normal creatinine, normal WBC count, normal troponin.  BNP significantly elevated at 1897.  Patient has been given IV Lasix.  Discussed admission with [facility], Dr. Tamala Julian. Pt will be transferred for admission.   Aithana Kushner, Wenda Overland, MD 01/29/21 [phone]

## 2021-01-29 NOTE — ED Notes (Signed)
Report called to [facility] °

## 2021-01-29 NOTE — H&P (Signed)
History and Physical    Tara Fields EUM:[id number] DOB: 05-25-1928 DOA: 01/29/2021  PCP: [name], NP (Confirm with patient/family/NH records and if not entered, this has to be entered at [facility] point of entry) Patient coming from: Home  I have personally briefly reviewed patient's old medical records in [place]  Chief Complaint: SOB and swelling  HPI: Tara Fields is a 87 y.o. female with medical history significant of chronic systolic CHF with most recent LVEF 25 to 40%, chronic A. fib, on Eliquis, HTN, COPD, aortic stenosis status post TAVR in 2020, chronic iron deficiency anemia on iron supplement, HLD, presented with increasing shortness of breath and leg swelling.  Patient was hospitalized 1 month ago for CHF decompensation, discharged home with Lasix once a day.  However patient soon developed another episode of CHF decompensation, her cardiologist increased her Lasix from 20 mg once a day to 20 mg twice daily from my 22nd, however, patient's son reported that patient appears to continue to accumulate fluid in her ankles despite as been compliant with twice daily Lasix.  Patient herself reports that Lasix makes her feeling nauseous.  Both patient's son reported that it seems that the patient does not urinate increasing amount after taking Lasix either morning or evening doses.  No chest pain or cough.  Son reported that patient blood pressure runs 140s/80s.  At home.  Neither son or patient aware of fluid restriction, and it seems they are not enforcing fluid restriction. ED Course: At [facility], patient was found to have fluid overload, 40 mg Lasix was given and patient urinated 1 L in total and reports feeling much better.  X-ray on admission showed pulmonary congestion.  Review of Systems: As per HPI otherwise 14 point review of systems negative.    Past Medical History:  Diagnosis Date   Aortic stenosis    s/p TAVR 26 mm Edwards Sapien 3 THV July 2020   CAD  (coronary artery disease)    COPD (chronic obstructive pulmonary disease) ([facility])    Diabetes mellitus ([facility])    Diverticulitis    GERD (gastroesophageal reflux disease)    HTN (hypertension)    Hyperlipidemia    Legally blind in left eye, as defined in [place]    Tobacco abuse     Past Surgical History:  Procedure Laterality Date   ABDOMINAL HYSTERECTOMY     APPENDECTOMY     BACK SURGERY     CATARACT EXTRACTION, BILATERAL     CHOLECYSTECTOMY       reports that she quit smoking about 13 months ago. Her smoking use included cigarettes. She has a 70.00 pack-year smoking history. She has never used smokeless tobacco. She reports that she does not drink alcohol and does not use drugs.  Allergies  Allergen Reactions   Amlodipine Besy-Benazepril Hcl Cough   Hctz [Hydrochlorothiazide] Other (See Comments)    Per family, every diuretic the patient has been on flushes out her out and ultimately results in CRAMPING/JAUNDICE (added potassium might help)   Other Other (See Comments)    History of diverticulitis- CANNOT TOLERATE NUTS, CORN, AND ANY FOODS NOT EASILY DIGESTED- the patient AVOIDS these   Cyclobenzaprine Other (See Comments)    Confusion- does not tolerate well    Diclofenac Sodium Diarrhea   Hydralazine Other (See Comments)    Per family, every diuretic the patient has been on flushes out her out and ultimately results in CRAMPING/JAUNDICE (added potassium might help)   Methylprednisolone Other (See Comments)  Medrol/CORTICOSTEROIDS = A LOT OF ENERGY   Oxycodone Other (See Comments)    Caused patient to "act crazy"    Family History  Problem Relation Age of Onset   Diabetes Mother    Stroke Brother    Cerebral palsy Brother    Coronary artery disease Neg Hx      Prior to Admission medications   Medication Sig Start Date End Date Taking? Authorizing Provider  aspirin 81 MG chewable tablet Chew 1 tablet (81 mg total) by mouth daily. 04/01/20  Yes Nafziger, Tommi Rumps, NP   atorvastatin (LIPITOR) 40 MG tablet TAKE 1 TABLET BY MOUTH DAILY AT 6 PM. 10/09/20  Yes Nafziger, Tommi Rumps, NP  Cholecalciferol (VITAMIN D-3 PO) Take 1,000 Units by mouth 2 (two) times daily.   Yes [provider]  Coenzyme Q10 (CO Q-10) 300 MG CAPS Take 300 mg by mouth in the morning.   Yes [provider]  ELIQUIS 5 MG TABS tablet TAKE 1 TABLET BY MOUTH TWICE A DAY 10/22/20  Yes Nafziger, Tommi Rumps, NP  fenofibrate 160 MG tablet TAKE 1 TABLET BY MOUTH EVERY DAY 10/22/20  Yes Nafziger, Tommi Rumps, NP  ferrous gluconate (FERGON) 324 MG tablet Take 1 tablet (324 mg total) by mouth daily with breakfast. 10/09/20  Yes Nafziger, Tommi Rumps, NP  furosemide (LASIX) 20 MG tablet Take 1 tablet (20 mg total) by mouth 2 (two) times daily. 01/08/21  Yes Burnell Blanks, MD  losartan (COZAAR) 100 MG tablet TAKE 1 TABLET BY MOUTH EVERY DAY 01/14/21  Yes Burnell Blanks, MD  metoprolol tartrate (LOPRESSOR) 50 MG tablet TAKE 1 TABLET BY MOUTH TWICE A DAY 10/22/20  Yes Nafziger, Tommi Rumps, NP  Multiple Vitamins-Minerals (CENTRUM SILVER 50+WOMEN) TABS Take 1 tablet by mouth daily with breakfast.   Yes [provider]  NASAL SPRAY SALINE NA Place 1 spray into both nostrils as needed (for congestion).   Yes [provider]  ondansetron (ZOFRAN) 4 MG tablet Take 1 tablet (4 mg total) by mouth every 6 (six) hours. 01/10/21  Yes Curatolo, Adam, DO  potassium chloride (KLOR-CON) 10 MEQ tablet Take 1 tablet (10 mEq total) by mouth daily. 01/08/21  Yes Burnell Blanks, MD  psyllium (METAMUCIL) 58.6 % powder Take 1 packet by mouth daily as needed (for constipation).   Yes [provider]  terazosin (HYTRIN) 10 MG capsule Take 1 capsule (10 mg total) by mouth daily at 12 noon. 12/16/20  Yes Nafziger, Tommi Rumps, NP  traMADol (ULTRAM) 50 MG tablet TAKE 1 TABLET (50 MG TOTAL) BY MOUTH IN THE MORNING, AT NOON, AND AT BEDTIME. 10/03/20  Yes Nafziger, Tommi Rumps, NP  acetaminophen (TYLENOL) 500 MG tablet Take 500  mg by mouth every 6 (six) hours as needed for mild pain (or headaches).    [provider]  Omega-3 Fatty Acids (FISH OIL) 1200 MG CAPS Take 1,200 mg by mouth in the morning.    [provider]  traZODone (DESYREL) 50 MG tablet Take 0.5 tablets (25 mg total) by mouth at bedtime. 12/21/20 01/20/21  Elodia Florence., MD    Physical Exam: Vitals:   01/29/21 0745 01/29/21 0800 01/29/21 0803 01/29/21 1051  BP: 134/78 (!) 145/83 (!) 145/83 135/74  Pulse: 89 83 89 93  Resp: (!) 23 (!) 22 (!) 23   Temp:    97.7 F (36.5 C)  TempSrc:    Oral  SpO2: 96% 95% 97% 98%  Weight:      Height:  Constitutional: NAD, calm, comfortable Vitals:   01/29/21 0745 01/29/21 0800 01/29/21 0803 01/29/21 1051  BP: 134/78 (!) 145/83 (!) 145/83 135/74  Pulse: 89 83 89 93  Resp: (!) 23 (!) 22 (!) 23   Temp:    97.7 F (36.5 C)  TempSrc:    Oral  SpO2: 96% 95% 97% 98%  Weight:      Height:       Eyes: PERRL, lids and conjunctivae normal ENMT: Mucous membranes are moist. Posterior pharynx clear of any exudate or lesions.Normal dentition.  Neck: normal, supple, no masses, no thyromegaly Respiratory: clear to auscultation bilaterally, no wheezing, fine crackles on bilateral bases.  Increasing respiratory effort. No accessory muscle use.  Cardiovascular: Regular rate and rhythm, no murmurs / rubs / gallops. 1+ extremity edema. 2+ pedal pulses. No carotid bruits.  Abdomen: no tenderness, no masses palpated. No hepatosplenomegaly. Bowel sounds positive.  Musculoskeletal: no clubbing / cyanosis. No joint deformity upper and lower extremities. Good ROM, no contractures. Normal muscle tone.  Skin: no rashes, lesions, ulcers. No induration Neurologic: CN 2-12 grossly intact. Sensation intact, DTR normal. Strength 5/5 in all 4.  Psychiatric: Normal judgment and insight. Alert and oriented x 3. Normal mood.     Labs on Admission: I have personally reviewed following labs and imaging  studies  CBC: Recent Labs  Lab 01/29/21 0636  WBC 6.6  NEUTROABS 5.8  HGB 10.7*  HCT 34.2*  MCV 89.3  PLT 409   Basic Metabolic Panel: Recent Labs  Lab 01/29/21 0636  NA 140  K 3.6  CL 104  CO2 23  GLUCOSE 156*  BUN 22  CREATININE 0.81  CALCIUM 9.1   GFR: Estimated Creatinine Clearance: 39.2 mL/min (by C-G formula based on SCr of 0.81 mg/dL). Liver Function Tests: Recent Labs  Lab 01/29/21 0636  AST 24  ALT 13  ALKPHOS 37*  BILITOT 1.1  PROT 6.7  ALBUMIN 3.8   No results for input(s): LIPASE, AMYLASE in the last 168 hours. No results for input(s): AMMONIA in the last 168 hours. Coagulation Profile: No results for input(s): INR, PROTIME in the last 168 hours. Cardiac Enzymes: No results for input(s): CKTOTAL, CKMB, CKMBINDEX, TROPONINI in the last 168 hours. BNP (last 3 results) No results for input(s): PROBNP in the last 8760 hours. HbA1C: No results for input(s): HGBA1C in the last 72 hours. CBG: No results for input(s): GLUCAP in the last 168 hours. Lipid Profile: No results for input(s): CHOL, HDL, LDLCALC, TRIG, CHOLHDL, LDLDIRECT in the last 72 hours. Thyroid Function Tests: No results for input(s): TSH, T4TOTAL, FREET4, T3FREE, THYROIDAB in the last 72 hours. Anemia Panel: No results for input(s): VITAMINB12, FOLATE, FERRITIN, TIBC, IRON, RETICCTPCT in the last 72 hours. Urine analysis:    Component Value Date/Time   COLORURINE YELLOW 09/01/2020 1403   APPEARANCEUR HAZY (A) 09/01/2020 1403   LABSPEC 1.019 09/01/2020 1403   PHURINE 6.0 09/01/2020 1403   GLUCOSEU NEGATIVE 09/01/2020 1403   GLUCOSEU NEGATIVE 12/11/2019 1033   HGBUR NEGATIVE 09/01/2020 [place] 09/01/2020 1403   [place] 09/01/2020 1403   PROTEINUR 30 (A) 09/01/2020 1403   UROBILINOGEN 0.2 12/11/2019 1033   NITRITE NEGATIVE 09/01/2020 1403   LEUKOCYTESUR TRACE (A) 09/01/2020 1403    Radiological Exams on Admission: DG Chest Port 1  View  Result Date: 01/29/2021 CLINICAL DATA:  Shortness of breath. EXAM: PORTABLE CHEST 1 VIEW COMPARISON:  Multiple priors, most recent Jan 10, 2021. FINDINGS: Similar diffuse interstitial prominence.  Similar more focal mixed interstitial airspace opacity in the left upper lobe/suprahilar region, described previously on CT as post radiation change. Possible small layering bilateral pleural effusions on this semi upright radiograph. Similar enlargement of the cardiac silhouette. Aortic valve replacement. Calcific atherosclerosis of the aorta. Polyarticular degenerative change. IMPRESSION: 1. Similar cardiomegaly and diffuse interstitial prominence, suggestive of interstitial pulmonary edema. 2. Possible small layering bilateral pleural effusions. 3. Similar more focal mixed interstitial airspace opacity in left upper lobe/suprahilar region, described previously on CT as post radiation change. Electronically Signed   By: Margaretha Sheffield MD   On: 01/29/2021 07:30    EKG: Independently reviewed.  A. fib, widened QRS  Assessment/Plan Active Problems:   Acute on chronic systolic CHF (congestive heart failure) ([place])  (please populate well all problems here in Problem List. (For example, if patient is on BP meds at home and you resume or decide to hold them, it is a problem that needs to be her. Same for CAD, COPD, HLD and so on)   Acute on chronic systolic CHF decompensation -Continue IV Lasix 20 mg twice daily, repeat chest x-ray tomorrow. -Discussed with son over the phone regarding few issue, 1st is fluid restriction, made family aware that Goal is 1800 ml/day, 2nd is her BP, seems a bit high for LVEF 25-30%, may need a bit more aggressive management.  Third issue was about tolerance, may consider change Lasix to torsemide, and potassium pill to liquid form.  HTN -As above  Chronic A. Fib -Rate controlled, continue Eliquis  Widened QRS -With LVEF 25 to 30% communicate for CRT, defer to her  cardiology for outpatient follow-up.  COPD -Stable  HLD -Statin  DVT prophylaxis: Eliquis Code Status: Full code Family Communication: Son over the phone Disposition Plan: Expect more than 2 midnight hospital stay for IV diuresis and PT evaluation. Consults called: None Admission status: Telemetry admission   Lequita Halt MD [facility] Pager [phone]  01/29/2021, 1:24 PM

## 2021-01-29 NOTE — ED Notes (Signed)
Called bed ready with Helaine Chess at [phone]

## 2021-01-29 NOTE — ED Provider Notes (Signed)
[facility] EMERGENCY DEPT Provider Note   CSN: [id number] Arrival date & time: 01/29/21  7616     History Chief Complaint  Patient presents with   Nausea   Leg Swelling    Tara Fields is a 87 y.o. female.  Patient presents to the emergency department for evaluation of shortness of breath and nausea.  Patient reports that she has noticed an ongoing problem with shortness of breath and dyspnea on exertion over the last couple of months.  In the last 2 days, however, she has noticed increased swelling of the legs and an increase in her breathing difficulty.  She has had nausea without abdominal pain.  In the past this has coincided with her congestive heart failure exacerbations.      Past Medical History:  Diagnosis Date   Aortic stenosis    s/p TAVR 26 mm Edwards Sapien 3 THV July 2020   CAD (coronary artery disease)    COPD (chronic obstructive pulmonary disease) (HCC)    Diabetes mellitus (HCC)    Diverticulitis    GERD (gastroesophageal reflux disease)    HTN (hypertension)    Hyperlipidemia    Legally blind in left eye, as defined in [place]    Tobacco abuse     Patient Active Problem List   Diagnosis Date Noted   Cognitive deficits    Pressure injury of skin 12/19/2020   Acute exacerbation of CHF (congestive heart failure) ([place]) 12/18/2020   CHF (congestive heart failure) ([place]) 12/18/2020   COPD (chronic obstructive pulmonary disease) ([place]) 09/02/2020   Dyslipidemia 09/02/2020   Acute respiratory failure with hypoxia ([place]) 09/01/2020   Pulmonary nodule 1 cm or greater in diameter 01/03/2020   S/P TAVR (transcatheter aortic valve replacement) 05/03/2019   Primary osteoarthritis involving multiple joints 05/03/2019   Other persistent atrial fibrillation ([place]) 05/03/2019   Bilateral carotid artery stenosis 12/01/2017   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/13/2014   Aortic valve disorder 05/03/2014   Chronic obstructive bronchitis without  exacerbation ([place]) 05/03/2014   Derangement of left knee 05/03/2014   Esophageal reflux 05/03/2014   Essential hypertension 05/03/2014   Pure hypercholesterolemia 05/03/2014   Type II diabetes mellitus ([place]) 05/03/2014   Vitamin D deficiency 05/03/2014    Past Surgical History:  Procedure Laterality Date   ABDOMINAL HYSTERECTOMY     APPENDECTOMY     BACK SURGERY     CATARACT EXTRACTION, BILATERAL     CHOLECYSTECTOMY       OB History     Gravida  2   Para  2   Term      Preterm      AB      Living         SAB      IAB      Ectopic      Multiple      Live Births              Family History  Problem Relation Age of Onset   Diabetes Mother    Stroke Brother    Cerebral palsy Brother    Coronary artery disease Neg Hx     Social History   Tobacco Use   Smoking status: Former    Packs/day: 1.00    Years: 70.00    Pack years: 70.00    Types: Cigarettes    Quit date: 12/15/2019    Years since quitting: 1.1   Smokeless tobacco: Never   Tobacco comments:    Pt  states quiting a year ago. 01/03/20 ARJ  Vaping Use   Vaping Use: Never used  Substance Use Topics   Alcohol use: No    Alcohol/week: 0.0 standard drinks   Drug use: No    Home Medications Prior to Admission medications   Medication Sig Start Date End Date Taking? Authorizing Provider  acetaminophen (TYLENOL) 500 MG tablet Take 500 mg by mouth every 6 (six) hours as needed for mild pain (or headaches).    [provider]  aspirin 81 MG chewable tablet Chew 1 tablet (81 mg total) by mouth daily. 04/01/20   Nafziger, Tommi Rumps, NP  atorvastatin (LIPITOR) 40 MG tablet TAKE 1 TABLET BY MOUTH DAILY AT 6 PM. 10/09/20   Nafziger, Tommi Rumps, NP  Cholecalciferol (VITAMIN D-3 PO) Take 1,000 Units by mouth 2 (two) times daily.    [provider]  Coenzyme Q10 (CO Q-10) 300 MG CAPS Take 300 mg by mouth in the morning.    [provider]  ELIQUIS 5 MG TABS tablet TAKE 1 TABLET BY MOUTH  TWICE A DAY 10/22/20   Nafziger, Tommi Rumps, NP  fenofibrate 160 MG tablet TAKE 1 TABLET BY MOUTH EVERY DAY 10/22/20   Nafziger, Tommi Rumps, NP  ferrous gluconate (FERGON) 324 MG tablet Take 1 tablet (324 mg total) by mouth daily with breakfast. 10/09/20   Nafziger, Tommi Rumps, NP  furosemide (LASIX) 20 MG tablet Take 1 tablet (20 mg total) by mouth 2 (two) times daily. 01/08/21   Burnell Blanks, MD  losartan (COZAAR) 100 MG tablet TAKE 1 TABLET BY MOUTH EVERY DAY 01/14/21   Burnell Blanks, MD  metoprolol tartrate (LOPRESSOR) 50 MG tablet TAKE 1 TABLET BY MOUTH TWICE A DAY 10/22/20   Nafziger, Tommi Rumps, NP  Multiple Vitamins-Minerals (CENTRUM SILVER 50+WOMEN) TABS Take 1 tablet by mouth daily with breakfast.    [provider]  NASAL SPRAY SALINE NA Place 1 spray into both nostrils as needed (for congestion).    [provider]  Omega-3 Fatty Acids (FISH OIL) 1200 MG CAPS Take 1,200 mg by mouth in the morning.    [provider]  ondansetron (ZOFRAN) 4 MG tablet Take 1 tablet (4 mg total) by mouth every 6 (six) hours. 01/10/21   Curatolo, Adam, DO  potassium chloride (KLOR-CON) 10 MEQ tablet Take 1 tablet (10 mEq total) by mouth daily. 01/08/21   Burnell Blanks, MD  psyllium (METAMUCIL) 58.6 % powder Take 1 packet by mouth daily as needed (for constipation).    [provider]  terazosin (HYTRIN) 10 MG capsule Take 1 capsule (10 mg total) by mouth daily at 12 noon. 12/16/20   Nafziger, Tommi Rumps, NP  traMADol (ULTRAM) 50 MG tablet TAKE 1 TABLET (50 MG TOTAL) BY MOUTH IN THE MORNING, AT NOON, AND AT BEDTIME. 10/03/20   Nafziger, Tommi Rumps, NP  traZODone (DESYREL) 50 MG tablet Take 0.5 tablets (25 mg total) by mouth at bedtime. 12/21/20 01/20/21  Elodia Florence., MD    Allergies    Amlodipine besy-benazepril hcl, Hctz [hydrochlorothiazide], Other, Cyclobenzaprine, Diclofenac sodium, Hydralazine, Methylprednisolone, and Oxycodone  Review of Systems   Review of Systems   Respiratory:  Positive for shortness of breath.   Cardiovascular:  Positive for leg swelling. Negative for chest pain.  Gastrointestinal:  Positive for nausea. Negative for vomiting.  All other systems reviewed and are negative.  Physical Exam Updated Vital Signs BP (!) 146/85   Pulse 91   Temp 97.8 F (36.6 C) (Oral)   Resp [name])  23   Ht 5' (1.524 m)   Wt 66.2 kg   SpO2 91%   BMI 28.51 kg/m   Physical Exam Vitals and nursing note reviewed.  Constitutional:      General: She is not in acute distress.    Appearance: Normal appearance. She is well-developed.  HENT:     Head: Normocephalic and atraumatic.     Right Ear: Hearing normal.     Left Ear: Hearing normal.     Nose: Nose normal.  Eyes:     Conjunctiva/sclera: Conjunctivae normal.     Pupils: Pupils are equal, round, and reactive to light.  Cardiovascular:     Rate and Rhythm: Rhythm irregular.     Heart sounds: S1 normal and S2 normal. No murmur heard.   No friction rub. No gallop.  Pulmonary:     Effort: Tachypnea and accessory muscle usage present. No respiratory distress.     Breath sounds: Rales present.  Chest:     Chest wall: No tenderness.  Abdominal:     General: Bowel sounds are normal.     Palpations: Abdomen is soft.     Tenderness: There is no abdominal tenderness. There is no guarding or rebound. Negative signs include Murphy's sign and McBurney's sign.     Hernia: No hernia is present.  Musculoskeletal:        General: Normal range of motion.     Cervical back: Normal range of motion and neck supple.     Right lower leg: 1+ Pitting Edema present.     Left lower leg: 1+ Pitting Edema present.  Skin:    General: Skin is warm and dry.     Findings: No rash.  Neurological:     Mental Status: She is alert and oriented to person, place, and time.     GCS: GCS eye subscore is 4. GCS verbal subscore is 5. GCS motor subscore is 6.     Cranial Nerves: No cranial nerve deficit.     Sensory: No sensory  deficit.     Coordination: Coordination normal.  Psychiatric:        Speech: Speech normal.        Behavior: Behavior normal.        Thought Content: Thought content normal.    ED Results / Procedures / Treatments   Labs (all labs ordered are listed, but only abnormal results are displayed) Labs Reviewed  CBC WITH DIFFERENTIAL/PLATELET - Abnormal; Notable for the following components:      Result Value   RBC 3.83 (*)    Hemoglobin 10.7 (*)    HCT 34.2 (*)    RDW 15.9 (*)    Lymphs Abs 0.4 (*)    All other components within normal limits  RESP PANEL BY RT-PCR (FLU A&B, COVID) [id number]  COMPREHENSIVE METABOLIC PANEL  BRAIN NATRIURETIC PEPTIDE  TROPONIN I (HIGH SENSITIVITY)    EKG EKG Interpretation  Date/Time:  Thursday January 29 2021 06:43:54 EDT Ventricular Rate:  90 PR Interval:    QRS Duration: 174 QT Interval:  430 QTC Calculation: 527 R Axis:   132 Text Interpretation: Atrial fibrillation Nonspecific intraventricular conduction delay Repol abnrm suggests ischemia, diffuse leads Confirmed by Orpah Greek [phone]) on 01/29/2021 6:49:48 AM  Radiology No results found.  Procedures Procedures   Medications Ordered in ED Medications - No data to display  ED Course  I have reviewed the triage vital signs and the nursing notes.  Pertinent labs & imaging results that  were available during my care of the patient were reviewed by me and considered in my medical decision making (see chart for details).    MDM Rules/Calculators/A&P                         Patient with history of permanent atrial fibrillation, TAVR, congestive heart failure presents to the emergency department with complaints of increasing shortness of breath with increased swelling of lower extremities.  Reviewing her records reveals that she was hospitalized for congestive heart failure exacerbation in May.  During that hospitalization she had an echo that showed ejection fraction of 25 to 30%.  Son  reports that this was a significant decrease for her.  She is taking Lasix 20 mg twice daily.  At arrival she is tachypneic and hypoxic.  She does not use oxygen at baseline.  Oxygen saturations improved with 2 L by nasal cannula.  Presentation concerning for decompensated congestive heart failure.  Work-up initiated.  Will sign out to oncoming ER physician to follow results.  Final Clinical Impression(s) / ED Diagnoses Final diagnoses:  Acute on chronic congestive heart failure, unspecified heart failure type [facility])    Rx / DC Orders ED Discharge Orders     None        Seana Underwood, Gwenyth Allegra, MD 01/29/21 [phone]

## 2021-01-30 ENCOUNTER — Inpatient Hospital Stay (HOSPITAL_COMMUNITY): Payer: Medicare Other

## 2021-01-30 DIAGNOSIS — I5023 Acute on chronic systolic (congestive) heart failure: Secondary | ICD-10-CM

## 2021-01-30 LAB — BASIC METABOLIC PANEL
Anion gap: 13 (ref 5–15)
BUN: 21 mg/dL (ref 8–23)
CO2: 23 mmol/L (ref 22–32)
Calcium: 8.8 mg/dL — ABNORMAL LOW (ref 8.9–10.3)
Chloride: 104 mmol/L (ref 98–111)
Creatinine, Ser: 0.85 mg/dL (ref 0.44–1.00)
GFR, Estimated: >60 mL/min (ref >60)
Glucose, Bld: 91 mg/dL (ref 70–99)
Potassium: 3.7 mmol/L (ref 3.5–5.1)
Sodium: 140 mmol/L (ref 135–145)

## 2021-01-30 NOTE — Progress Notes (Signed)
PROGRESS NOTE    Tara Fields  GNF:[id number] DOB: 1927-08-27 DOA: 01/29/2021 PCP: [name], NP     Brief Narrative:  Tara Fields is a 87 y.o. female with medical history significant of chronic systolic CHF with most recent LVEF 25 to 40%, chronic A. fib, on Eliquis, HTN, COPD, aortic stenosis status post TAVR in 2020, chronic iron deficiency anemia on iron supplement, HLD, presented with increasing shortness of breath and leg swelling.   Patient was hospitalized 1 month ago for CHF decompensation, discharged home with Lasix once a day.  However patient soon developed another episode of CHF decompensation, her cardiologist increased her Lasix from 20 mg once a day to 20 mg twice daily from 22nd, however, patient's son reported that patient appears to continue to accumulate fluid in her ankles despite as been compliant with twice daily Lasix.  Patient herself reports that Lasix makes her feeling nauseous.  Both patient's son reported that it seems that the patient does not urinate increasing amount after taking Lasix either morning or evening doses.  No chest pain or cough.  Son reported that patient blood pressure runs 140s/80s.  Neither son or patient aware of fluid restriction, and it seems they are not enforcing fluid restriction.  New events last 24 hours / Subjective: Patient states that she is feeling somewhat better today.  She remains stable on room air.  Believes her legs and feet swelling have improved greatly.  She has diuresed 1.8 L last 24 hours.  Assessment & Plan:   Active Problems:   Acute on chronic systolic CHF (congestive heart failure) (HCC)   Acute on chronic systolic CHF with severe aortic stenosis status post TAVR -Follows with Dr. Angelena Form.  She had declined ischemic evaluation back in May -BNP 1896.8 -Continue IV Lasix -Strict I's and O's, daily weight, fluid restriction diet -Repeat chest x-ray this morning reveals slightly improved diffuse  interstitial prominence  Chronic A. Fib -Continue Eliquis, Lopressor  Hyperlipidemia -Continue lipitor   HTN -Continue Lasix, Cozaar, Lopressor  DVT prophylaxis:   apixaban (ELIQUIS) tablet 5 mg  Code Status:     Code Status Orders  (From admission, onward)           Start     Ordered   01/29/21 1258  Full code  Continuous        01/29/21 1258           Code Status History     Date Active Date Inactive Code Status Order ID Comments User Context   12/18/2020 1711 12/21/2020 2012 Full Code [id number]  Lequita Halt, MD Inpatient   09/01/2020 1713 09/04/2020 1828 Full Code [id number]  Little Ishikawa, MD ED      Family Communication: No family at bedside Disposition Plan:  Status is: Inpatient  Remains inpatient appropriate because:IV treatments appropriate due to intensity of illness or inability to take PO  Dispo: The patient is from: ALF              Anticipated d/c is to: ALF              Patient currently is not medically stable to d/c.   Difficult to place patient No      Antimicrobials:  Anti-infectives (From admission, onward)    None        Objective: Vitals:   01/30/21 0257 01/30/21 0451 01/30/21 0800 01/30/21 1144  BP:  124/80 107/68 (!) 125/45  Pulse:  77 87 65  Resp:  17  18  Temp:  97.7 F (36.5 C)  98.3 F (36.8 C)  TempSrc:  Oral    SpO2:  91% 94% 96%  Weight: 65.4 kg     Height:        Intake/Output Summary (Last 24 hours) at 01/30/2021 1316 Last data filed at 01/30/2021 0834 Gross per 24 hour  Intake 800 ml  Output 1150 ml  Net -350 ml   Filed Weights   01/29/21 0631 01/29/21 1742 01/30/21 0257  Weight: 66.2 kg 66.1 kg 65.4 kg    Examination:  General exam: Appears calm and comfortable  Respiratory system: Diminished breath sounds bilaterally Cardiovascular system: S1 & S2 heard, trace pedal edema. Gastrointestinal system: Abdomen is nondistended, soft and nontender. Normal bowel sounds heard. Central nervous  system: Alert and oriented. No focal neurological deficits. Speech clear.  Extremities: Symmetric in appearance  Skin: No rashes, lesions or ulcers on exposed skin  Psychiatry: Judgement and insight appear normal. Mood & affect appropriate.   Data Reviewed: I have personally reviewed following labs and imaging studies  CBC: Recent Labs  Lab 01/29/21 0636  WBC 6.6  NEUTROABS 5.8  HGB 10.7*  HCT 34.2*  MCV 89.3  PLT 945   Basic Metabolic Panel: Recent Labs  Lab 01/29/21 0636 01/30/21 0137  NA 140 140  K 3.6 3.7  CL 104 104  CO2 23 23  GLUCOSE 156* 91  BUN 22 21  CREATININE 0.81 0.85  CALCIUM 9.1 8.8*   GFR: Estimated Creatinine Clearance: 37.2 mL/min (by C-G formula based on SCr of 0.85 mg/dL). Liver Function Tests: Recent Labs  Lab 01/29/21 0636  AST 24  ALT 13  ALKPHOS 37*  BILITOT 1.1  PROT 6.7  ALBUMIN 3.8   No results for input(s): LIPASE, AMYLASE in the last 168 hours. No results for input(s): AMMONIA in the last 168 hours. Coagulation Profile: No results for input(s): INR, PROTIME in the last 168 hours. Cardiac Enzymes: No results for input(s): CKTOTAL, CKMB, CKMBINDEX, TROPONINI in the last 168 hours. BNP (last 3 results) No results for input(s): PROBNP in the last 8760 hours. HbA1C: No results for input(s): HGBA1C in the last 72 hours. CBG: No results for input(s): GLUCAP in the last 168 hours. Lipid Profile: No results for input(s): CHOL, HDL, LDLCALC, TRIG, CHOLHDL, LDLDIRECT in the last 72 hours. Thyroid Function Tests: No results for input(s): TSH, T4TOTAL, FREET4, T3FREE, THYROIDAB in the last 72 hours. Anemia Panel: No results for input(s): VITAMINB12, FOLATE, FERRITIN, TIBC, IRON, RETICCTPCT in the last 72 hours. Sepsis Labs: No results for input(s): PROCALCITON, LATICACIDVEN in the last 168 hours.  Recent Results (from the past 240 hour(s))  Resp Panel by RT-PCR (Flu A&B, Covid) Nasopharyngeal Swab     Status: None   Collection Time:  01/29/21  6:37 AM   Specimen: Nasopharyngeal Swab; Nasopharyngeal(NP) swabs in vial transport medium  Result Value Ref Range Status   SARS Coronavirus 2 by RT PCR NEGATIVE NEGATIVE Final    Comment: (NOTE) SARS-CoV-2 target nucleic acids are NOT DETECTED.  The SARS-CoV-2 RNA is generally detectable in upper respiratory specimens during the acute phase of infection. The lowest concentration of SARS-CoV-2 viral copies this assay can detect is 138 copies/mL. A negative result does not preclude SARS-Cov-2 infection and should not be used as the sole basis for treatment or other patient management decisions. A negative result may occur with  improper specimen collection/handling, submission of specimen other than nasopharyngeal swab, presence of viral mutation(s) within the areas  targeted by this assay, and inadequate number of viral copies(<138 copies/mL). A negative result must be combined with clinical observations, patient history, and epidemiological information. The expected result is Negative.  Fact Sheet for Patients:  EntrepreneurPulse.com.au  Fact Sheet for Healthcare Providers:  IncredibleEmployment.be  This test is no t yet approved or cleared by the [place] FDA and  has been authorized for detection and/or diagnosis of SARS-CoV-2 by FDA under an Emergency Use Authorization (EUA). This EUA will remain  in effect (meaning this test can be used) for the duration of the COVID-19 declaration under Section 564(b)(1) of the Act, 21 U.S.C.section 360bbb-3(b)(1), unless the authorization is terminated  or revoked sooner.       Influenza A by PCR NEGATIVE NEGATIVE Final   Influenza B by PCR NEGATIVE NEGATIVE Final    Comment: (NOTE) The Xpert Xpress SARS-CoV-2/FLU/RSV plus assay is intended as an aid in the diagnosis of influenza from Nasopharyngeal swab specimens and should not be used as a sole basis for treatment. Nasal washings  and aspirates are unacceptable for Xpert Xpress SARS-CoV-2/FLU/RSV testing.  Fact Sheet for Patients: EntrepreneurPulse.com.au  Fact Sheet for Healthcare Providers: IncredibleEmployment.be  This test is not yet approved or cleared by the [place] FDA and has been authorized for detection and/or diagnosis of SARS-CoV-2 by FDA under an Emergency Use Authorization (EUA). This EUA will remain in effect (meaning this test can be used) for the duration of the COVID-19 declaration under Section 564(b)(1) of the Act, 21 U.S.C. section 360bbb-3(b)(1), unless the authorization is terminated or revoked.  Performed at [facility], [address], [place], [place] [address]       Radiology Studies: [facility] Chest [facility] 1 View  Result Date: 01/30/2021 CLINICAL DATA:  CHF. EXAM: PORTABLE CHEST 1 VIEW COMPARISON:  January 29, 2021. FINDINGS: Slightly improved diffuse interstitial prominence. Slightly increased conspicuity of left upper lobe/suprahilar opacity in a region of post radiation change and left basilar opacities. No definite pleural effusions or visible pneumothorax on this single semi erect radiograph. Similar enlarged cardiac silhouette. Aortic valve replacement hand aortic calcific atherosclerosis. Polyarticular degenerative change. IMPRESSION: 1. Slightly improved diffuse interstitial prominence, suggestive of interstitial edema. Similar cardiomegaly. 2. Slightly increased conspicuity of left upper lobe/suprahilar opacity in a region of post radiation change and left basilar opacities, which could relate to atelectasis and/or pneumonia. Electronically Signed   By: Margaretha Sheffield MD   On: 01/30/2021 06:37   DG Chest Port 1 View  Result Date: 01/29/2021 CLINICAL DATA:  Shortness of breath. EXAM: PORTABLE CHEST 1 VIEW COMPARISON:  Multiple priors, most recent Jan 10, 2021. FINDINGS: Similar diffuse interstitial prominence. Similar more  focal mixed interstitial airspace opacity in the left upper lobe/suprahilar region, described previously on CT as post radiation change. Possible small layering bilateral pleural effusions on this semi upright radiograph. Similar enlargement of the cardiac silhouette. Aortic valve replacement. Calcific atherosclerosis of the aorta. Polyarticular degenerative change. IMPRESSION: 1. Similar cardiomegaly and diffuse interstitial prominence, suggestive of interstitial pulmonary edema. 2. Possible small layering bilateral pleural effusions. 3. Similar more focal mixed interstitial airspace opacity in left upper lobe/suprahilar region, described previously on CT as post radiation change. Electronically Signed   By: Margaretha Sheffield MD   On: 01/29/2021 07:30      Scheduled Meds:  apixaban  5 mg Oral BID   aspirin  81 mg Oral Daily   atorvastatin  40 mg Oral Daily   fenofibrate  160 mg Oral Daily   ferrous  gluconate  324 mg Oral Q breakfast   furosemide  20 mg Intravenous Q12H   losartan  100 mg Oral Daily   metoprolol tartrate  50 mg Oral BID   potassium chloride  10 mEq Oral Daily   sodium chloride flush  3 mL Intravenous Q12H   terazosin  10 mg Oral Q1200   traZODone  25 mg Oral QHS   Continuous Infusions:  sodium chloride       LOS: 1 day      Time spent: 25 minutes   Dessa Phi, DO [facility] 01/30/2021, 1:16 PM   Available via Epic secure chat 7am-7pm After these hours, please refer to coverage provider listed on [email or web address]

## 2021-01-30 NOTE — Evaluation (Signed)
Physical Therapy Evaluation Patient Details Name: Tara Fields MRN: [id number] DOB: [birth year] Today's Date: 01/30/2021   History of Present Illness  Pt is a 87 y.o. female admitted from ALF on 01/29/21 with SOB and BLE swelling. Workup for CHF exacerbation. PMH includes CHF, afib, HTN, COPD, AS s/p TAVR (2020), anemia.   Clinical Impression  Pt presents with an overall decrease in functional mobility secondary to above. PTA, pt resident at ALF, pt ambulatory with rollator, assist from family and staff for ADLs/iADLs as needed. Today, pt ambulatory with rollator and intermittent min guard for balance; DOE 3/4 with ambulation, SpO2 92% on RA. Pt limited by generalized weakness, decreased activity tolerance and impaired balance strategies. Pt would benefit from continued acute PT services to maximize functional mobility and independence prior to d/c with HHPT services at ALF.     Follow Up Recommendations Home health PT;Supervision - Intermittent (at ALF)    Equipment Recommendations  None recommended by PT    Recommendations for Other Services       Precautions / Restrictions Precautions Precautions: Fall Precaution Comments: monitor O2 Restrictions Weight Bearing Restrictions: No      Mobility  Bed Mobility               General bed mobility comments: standing in bathroom upon entry    Transfers Overall transfer level: Needs assistance Equipment used: 4-wheeled walker Transfers: Sit to/from Stand Sit to Stand: Supervision Stand pivot transfers: Min guard       General transfer comment: Multiple sit<>stands from EOB and rollator seat, supervision for safety, verbal cues to lock/unlock rollator brakes  Ambulation/Gait Ambulation/Gait assistance: Supervision;Min guard Gait Distance (Feet): 180 Feet Assistive device: 4-wheeled walker Gait Pattern/deviations: Step-through pattern;Decreased stride length Gait velocity: Decreased   General Gait Details: Slow,  mostly steady gait with rollator and intermittent min guard for balance; DOE 3/4 on RA; pt requiring 1x seated rest break secondary to SOB, fatigue and c/o back pain  Stairs            Wheelchair Mobility    Modified Rankin (Stroke Patients Only)       Balance Overall balance assessment: Needs assistance Sitting-balance support: No upper extremity supported;Feet supported Sitting balance-Leahy Scale: Good     Standing balance support: Bilateral upper extremity supported;During functional activity Standing balance-Leahy Scale: Fair Standing balance comment: can static stand and take steps without UE support; donning underwear standing at sink with and without UE support on sink; static and dynamic stability improved with UE support                             Pertinent Vitals/Pain Pain Assessment: Faces Faces Pain Scale: Hurts a little bit Pain Location: back Pain Descriptors / Indicators: Discomfort Pain Intervention(s): Monitored during session;Limited activity within patient's tolerance    Home Living Family/patient expects to be discharged to:: Assisted living               Home Equipment: Walker - 4 wheels;Cane - single point;Shower seat;Hand held shower head;Grab bars - tub/shower;Grab bars - toilet      Prior Function Level of Independence: Needs assistance   Gait / Transfers Assistance Needed: use of rollator for mobility, denies recent falls. reports difficulty mobilizing to dining hall due to back pain and SOB. denies recent falls  ADL's / Homemaking Assistance Needed: Reports able to complete ADLs without assist, family assist with med mgmt, meals brought to room  Hand Dominance   Dominant Hand: Right    Extremity/Trunk Assessment   Upper Extremity Assessment Upper Extremity Assessment: Generalized weakness    Lower Extremity Assessment Lower Extremity Assessment: Generalized weakness    Cervical / Trunk  Assessment Cervical / Trunk Assessment: Kyphotic  Communication   Communication: No difficulties  Cognition Arousal/Alertness: Awake/alert Behavior During Therapy: WFL for tasks assessed/performed;Impulsive Overall Cognitive Status: No family/caregiver present to determine baseline cognitive functioning Area of Impairment: Attention;Memory;Following commands;Safety/judgement;Awareness;Problem solving                   Current Attention Level: Selective Memory: Decreased short-term memory Following Commands: Follows one step commands inconsistently Safety/Judgement: Decreased awareness of deficits;Decreased awareness of safety Awareness: Intellectual;Emergent Problem Solving: Requires verbal cues General Comments: very pleasant and participatory, some impulsivity and leaving RW behind but reaching out for furniture. Decreased attention and memory deficits noted. Looking for clean pair of underwear - OT located and gave to pt but pt ultimately put it back in bag, then asked where OT put the underwear. Decreased attention for education on CHF mgmt      General Comments General comments (skin integrity, edema, etc.): SpO2 92% on RA    Exercises     Assessment/Plan    PT Assessment Patient needs continued PT services  PT Problem List Decreased strength;Decreased activity tolerance;Decreased balance;Decreased mobility;Decreased cognition;Cardiopulmonary status limiting activity;Pain       PT Treatment Interventions Gait training;Stair training;DME instruction;Functional mobility training;Therapeutic activities;Therapeutic exercise;Balance training;Patient/family education    PT Goals (Current goals can be found in the Care Plan section)  Acute Rehab PT Goals Patient Stated Goal: get better and return to ALF PT Goal Formulation: With patient Time For Goal Achievement: 02/13/21 Potential to Achieve Goals: Good    Frequency Min 3X/week   Barriers to discharge         Co-evaluation               AM-PAC PT "6 Clicks" Mobility  Outcome Measure Help needed turning from your back to your side while in a flat bed without using bedrails?: None Help needed moving from lying on your back to sitting on the side of a flat bed without using bedrails?: A Little Help needed moving to and from a bed to a chair (including a wheelchair)?: A Little Help needed standing up from a chair using your arms (e.g., wheelchair or bedside chair)?: A Little Help needed to walk in hospital room?: A Little Help needed climbing 3-5 steps with a railing? : A Little 6 Click Score: 19    End of Session   Activity Tolerance: Patient tolerated treatment well Patient left: in bed;with call bell/phone within reach;with bed alarm set Nurse Communication: Mobility status PT Visit Diagnosis: Other abnormalities of gait and mobility (R26.89);Muscle weakness (generalized) (M62.81)    Time: 7939-0300 PT Time Calculation (min) (ACUTE ONLY): 17 min   Charges:   PT Evaluation $PT Eval Moderate Complexity: Harts, PT, DPT Acute Rehabilitation Services  Pager [phone] Office [place] 01/30/2021, 10:10 AM

## 2021-01-30 NOTE — Evaluation (Addendum)
Occupational Therapy Evaluation Patient Details Name: Tara Fields MRN: [id number] DOB: 20-Apr-1928 Today's Date: 01/30/2021    History of Present Illness Tara Fields is a 87 y.o. female presented with increasing shortness of breath and leg swelling. Pt diagnosed with CHF exacerbation and admitted.  PMH: CHF, a fib, HTN, COPD, aortic stenosis status post TAVR in 2020,  anemia.   Clinical Impression   PTA, pt lives at ALF and reports Modified Independent with ADLs and mobility using Rollator. Pt presents now with deficits in cognition (memory, safety & attention), dynamic standing balance, strength and endurance. Pt overall min guard for mobility using RW, Setup for UB ADLs and min guard for LB ADLs. However, pt with distractibility during tasks and benefits from cues for safety with DME use to decrease fall risk. Provided CHF & Self Care handout with reinforcement needed. SpO2 96% on RA after activity. Recommend HHOT follow-up to maximize independence/safety with ADLs/mobility. Plan to further reinforce fall prevention strategies at acute level.     Follow Up Recommendations  Home health OT;Supervision - Intermittent    Equipment Recommendations  None recommended by OT    Recommendations for Other Services       Precautions / Restrictions Precautions Precautions: Fall;Other (comment) Precaution Comments: monitor O2 Restrictions Weight Bearing Restrictions: No      Mobility Bed Mobility               General bed mobility comments: sitting EOB on entry    Transfers Overall transfer level: Needs assistance Equipment used: Rolling walker (2 wheeled) Transfers: Sit to/from [facility] Sit to Stand: Supervision Stand pivot transfers: Min guard       General transfer comment: min guard for safety in turning, adjusted RW height to encourage proper hand placement on device. Cues for safety and RW use    Balance Overall balance assessment: Needs  assistance Sitting-balance support: No upper extremity supported;Feet supported Sitting balance-Leahy Scale: Good     Standing balance support: Bilateral upper extremity supported;During functional activity Standing balance-Leahy Scale: Fair Standing balance comment: fair static standing, reaches out for support during mobility without RW use                           ADL either performed or assessed with clinical judgement   ADL Overall ADL's : Needs assistance/impaired Eating/Feeding: Independent;Sitting Eating/Feeding Details (indicate cue type and reason): sitting EOB Grooming: Supervision/safety;Standing;Wash/dry hands;Brushing hair Grooming Details (indicate cue type and reason): supervision for safety, noted to push RW to the side Upper Body Bathing: Set up;Sitting   Lower Body Bathing: Min guard;Sit to/from stand   Upper Body Dressing : Set up;Sitting   Lower Body Dressing: Min guard;Sit to/from stand Lower Body Dressing Details (indicate cue type and reason): able to don socks sitting EOB, min guard for safety in LB dressing in standing Toilet Transfer: Min guard;Ambulation;RW;BSC Toilet Transfer Details (indicate cue type and reason): min guard, noted to push RW out of way and hold to bathroom door, BSC armrest for transfers [place] and Hygiene: Supervision/safety;Sit to/from stand Toileting - Clothing Manipulation Details (indicate cue type and reason): lateral leans for hygiene     Functional mobility during ADLs: Min guard;Rolling walker;Cueing for sequencing;Cueing for safety General ADL Comments: Pt with primary deficits in understanding CHF and mgmt of exacerbation, as well as decreased safety awareness with DME use, fall hazards, etc     Vision Patient Visual Report: No change from baseline Vision  Assessment?: No apparent visual deficits     Perception     Praxis      Pertinent Vitals/Pain Pain Assessment: No/denies pain      Hand Dominance Right   Extremity/Trunk Assessment Upper Extremity Assessment Upper Extremity Assessment: Generalized weakness   Lower Extremity Assessment Lower Extremity Assessment: Defer to PT evaluation   Cervical / Trunk Assessment Cervical / Trunk Assessment: Kyphotic   Communication Communication Communication: No difficulties   Cognition Arousal/Alertness: Awake/alert Behavior During Therapy: WFL for tasks assessed/performed;Impulsive Overall Cognitive Status: Impaired/Different from baseline Area of Impairment: Memory;Attention;Safety/judgement                   Current Attention Level: Selective Memory: Decreased short-term memory   Safety/Judgement: Decreased awareness of deficits;Decreased awareness of safety     General Comments: very pleasant and participatory, some impulsivity and leaving RW behind but reaching out for furniture. Decreased attention and memory deficits noted. Looking for clean pair of underwear - OT located and gave to pt but pt ultimately put it back in bag, then asked where OT put the underwear. Decreased attention for education on CHF mgmt   General Comments  SpO2 94% on RA at rest conversing with therapist, 96% after activity. Pt denies excessive SOB    Exercises     Shoulder Instructions      Home Living Family/patient expects to be discharged to:: Assisted living                             Home Equipment: Walker - 4 wheels;Cane - single point;Shower seat;Hand held shower head;Grab bars - tub/shower;Grab bars - toilet          Prior Functioning/Environment Level of Independence: Needs assistance  Gait / Transfers Assistance Needed: use of rollator for mobility, denies recent falls. reports difficulty mobilizing to dining hall due to dizziness. denies recent falls ADL's / Homemaking Assistance Needed: Reports able to complete ADLs without assist, family assist with med mgmt, meals brought to room             OT Problem List: Decreased strength;Impaired balance (sitting and/or standing);Decreased cognition;Decreased safety awareness;Decreased knowledge of use of DME or AE;Decreased activity tolerance;Cardiopulmonary status limiting activity      OT Treatment/Interventions: Self-care/ADL training;Therapeutic exercise;Energy conservation;DME and/or AE instruction;Therapeutic activities;Patient/family education;Balance training    OT Goals(Current goals can be found in the care plan section) Acute Rehab OT Goals Patient Stated Goal: get better and return to ALF OT Goal Formulation: With patient Time For Goal Achievement: 02/13/21 Potential to Achieve Goals: Good  OT Frequency: Min 2X/week   Barriers to D/C:            Co-evaluation              AM-PAC OT "6 Clicks" Daily Activity     Outcome Measure Help from another person eating meals?: None Help from another person taking care of personal grooming?: A Little Help from another person toileting, which includes using toliet, bedpan, or urinal?: A Little Help from another person bathing (including washing, rinsing, drying)?: A Little Help from another person to put on and taking off regular upper body clothing?: A Little Help from another person to put on and taking off regular lower body clothing?: A Little 6 Click Score: 19   End of Session Equipment Utilized During Treatment: Rolling walker Nurse Communication: Mobility status;Other (comment) (O2)  Activity Tolerance: Patient tolerated treatment well Patient left: in  bed;with call bell/phone within reach (sitting EOB w/ breakfast)  OT Visit Diagnosis: Other abnormalities of gait and mobility (R26.89);Muscle weakness (generalized) (M62.81)                Time: 0601-5615 OT Time Calculation (min): 29 min Charges:  OT General Charges $OT Visit: 1 Visit OT Evaluation $OT Eval Moderate Complexity: 1 Mod OT Treatments $Self Care/Home Management : 8-22 mins  Malachy Chamber,  OTR/L Acute Rehab Services Office: [phone]   Layla Maw 01/30/2021, 7:58 AM

## 2021-01-31 LAB — BASIC METABOLIC PANEL
Anion gap: 11 (ref 5–15)
BUN: 24 mg/dL — ABNORMAL HIGH (ref 8–23)
CO2: 24 mmol/L (ref 22–32)
Calcium: 9.3 mg/dL (ref 8.9–10.3)
Chloride: 103 mmol/L (ref 98–111)
Creatinine, Ser: 0.89 mg/dL (ref 0.44–1.00)
GFR, Estimated: >60 mL/min (ref >60)
Glucose, Bld: 132 mg/dL — ABNORMAL HIGH (ref 70–99)
Potassium: 3.6 mmol/L (ref 3.5–5.1)
Sodium: 138 mmol/L (ref 135–145)

## 2021-01-31 MED ORDER — ONDANSETRON HCL 4 MG PO TABS: 4.0000 mg | ORAL_TABLET | Freq: Four times a day (QID) | ORAL | 0 refills | Status: AC | PRN

## 2021-01-31 MED ORDER — TORSEMIDE 20 MG PO TABS: 20.0000 mg | ORAL_TABLET | Freq: Two times a day (BID) | ORAL | Status: DC

## 2021-01-31 MED ORDER — TORSEMIDE 20 MG PO TABS: 20.0000 mg | ORAL_TABLET | Freq: Two times a day (BID) | ORAL | 2 refills | Status: DC

## 2021-01-31 MED ORDER — POTASSIUM CHLORIDE 20 MEQ/15ML (10%) PO SOLN: 10.0000 meq | Freq: Every day | ORAL | 2 refills | Status: DC

## 2021-01-31 NOTE — Discharge Summary (Signed)
Physician Discharge Summary  Tara Fields OEV:[id number] DOB: [birth year] DOA: 01/29/2021  PCP: [name], NP  Admit date: 01/29/2021 Discharge date: 01/31/2021  Admitted From: ALF Disposition:  ALF   Recommendations for Outpatient Follow-up:  Follow up with PCP in 1 week  Discharge Condition: Stable CODE STATUS: Full code Diet recommendation: Heart healthy diet  Brief/Interim Summary: Tara Fields is a 87 y.o. female with medical history significant of chronic systolic CHF with most recent LVEF 25 to 40%, chronic A. fib, on Eliquis, HTN, COPD, aortic stenosis status post TAVR in 2020, chronic iron deficiency anemia on iron supplement, HLD, presented with increasing shortness of breath and leg swelling.   Patient was hospitalized 1 month ago for CHF decompensation, discharged home with Lasix once a day.  However patient soon developed another episode of CHF decompensation, her cardiologist increased her Lasix from 20 mg once a day to 20 mg twice daily from 22nd, however, patient's son reported that patient appears to continue to accumulate fluid in her ankles despite as been compliant with twice daily Lasix.  Patient herself reports that Lasix makes her feeling nauseous.  Both patient's son reported that it seems that the patient does not urinate increasing amount after taking Lasix either morning or evening doses.  No chest pain or cough.  Son reported that patient blood pressure runs 140s/80s.  Neither son or patient aware of fluid restriction, and it seems they are not enforcing fluid restriction.  Patient was diuresed with IV Lasix with good response.  Pedal edema resolved, patient's breathing stabilized.  Repeat chest x-ray improved.  Due to patient's complaint of nausea with her oral Lasix and potassium dose, her diuretic regimen was changed to torsemide and potassium liquid form.  Discharge Diagnoses:  Principal Problem:   Acute on chronic systolic CHF (congestive heart  failure) (HCC) Active Problems:   Aortic valve disorder   Essential hypertension   Other persistent atrial fibrillation (HCC)   Dyslipidemia    Acute on chronic systolic CHF with severe aortic stenosis status post TAVR -Follows with Dr. Angelena Form.  She had declined ischemic evaluation back in May -BNP 1896.8 -IV Lasix --> torsemide -Strict I's and O's, daily weight, fluid restriction diet -Repeat chest x-ray reveals slightly improved diffuse interstitial prominence -On room air this morning  Chronic A. Fib -Continue Eliquis, Lopressor  Hyperlipidemia -Continue lipitor   HTN -Continue torsemide, Cozaar, Lopressor   Discharge Instructions  Discharge Instructions     (HEART FAILURE PATIENTS) Call MD:  Anytime you have any of the following symptoms: 1) 3 pound weight gain in 24 hours or 5 pounds in 1 week 2) shortness of breath, with or without a dry hacking cough 3) swelling in the hands, feet or stomach 4) if you have to sleep on extra pillows at night in order to breathe.   Complete by: As directed    Call MD for:  difficulty breathing, headache or visual disturbances   Complete by: As directed    Call MD for:  extreme fatigue   Complete by: As directed    Call MD for:  persistant dizziness or light-headedness   Complete by: As directed    Call MD for:  persistant nausea and vomiting   Complete by: As directed    Call MD for:  severe uncontrolled pain   Complete by: As directed    Call MD for:  temperature >100.4   Complete by: As directed    Diet - low sodium heart healthy   Complete  by: As directed    Discharge instructions   Complete by: As directed    You were cared for by a hospitalist during your hospital stay. If you have any questions about your discharge medications or the care you received while you were in the hospital after you are discharged, you can call the unit and ask to speak with the hospitalist on call if the hospitalist that took care of you is not  available. Once you are discharged, your primary care physician will handle any further medical issues. Please note that NO REFILLS for any discharge medications will be authorized once you are discharged, as it is imperative that you return to your primary care physician (or establish a relationship with a primary care physician if you do not have one) for your aftercare needs so that they can reassess your need for medications and monitor your lab values.   Increase activity slowly   Complete by: As directed       Allergies as of 01/31/2021       Reactions   Amlodipine Besy-benazepril Hcl Cough   Hctz [hydrochlorothiazide] Other (See Comments)   Per family, every diuretic the patient has been on flushes out her out and ultimately results in CRAMPING/JAUNDICE (added potassium might help)   Other Other (See Comments)   History of diverticulitis- CANNOT TOLERATE NUTS, CORN, AND ANY FOODS NOT EASILY DIGESTED- the patient AVOIDS these   Cyclobenzaprine Other (See Comments)   Confusion- does not tolerate well    Diclofenac Sodium Diarrhea   Hydralazine Other (See Comments)   Per family, every diuretic the patient has been on flushes out her out and ultimately results in CRAMPING/JAUNDICE (added potassium might help)   Methylprednisolone Other (See Comments)   Medrol/CORTICOSTEROIDS = A LOT OF ENERGY   Oxycodone Other (See Comments)   Caused patient to "act crazy"        Medication List     STOP taking these medications    furosemide 20 MG tablet Commonly known as: LASIX   potassium chloride 10 MEQ tablet Commonly known as: KLOR-CON       TAKE these medications    acetaminophen 500 MG tablet Commonly known as: TYLENOL Take 500 mg by mouth every 6 (six) hours as needed for mild pain (or headaches).   aspirin 81 MG chewable tablet Chew 1 tablet (81 mg total) by mouth daily.   atorvastatin 40 MG tablet Commonly known as: LIPITOR TAKE 1 TABLET BY MOUTH DAILY AT 6 PM. What  changed: when to take this   Centrum Silver 50+Women Tabs Take 1 tablet by mouth daily with breakfast.   Co Q-10 300 MG Caps Take 300 mg by mouth in the morning.   Eliquis 5 MG Tabs tablet Generic drug: apixaban TAKE 1 TABLET BY MOUTH TWICE A DAY What changed: how much to take   fenofibrate 160 MG tablet TAKE 1 TABLET BY MOUTH EVERY DAY   ferrous gluconate 324 MG tablet Commonly known as: FERGON Take 1 tablet (324 mg total) by mouth daily with breakfast.   Fish Oil 1200 MG Caps Take 1,200 mg by mouth in the morning.   losartan 100 MG tablet Commonly known as: COZAAR TAKE 1 TABLET BY MOUTH EVERY DAY   Magnesium 200 MG Tabs Take 1 tablet by mouth daily at 6 (six) AM.   metoprolol tartrate 50 MG tablet Commonly known as: LOPRESSOR TAKE 1 TABLET BY MOUTH TWICE A DAY   NASAL SPRAY SALINE NA Place 1 spray  into both nostrils as needed (for congestion).   ondansetron 4 MG tablet Commonly known as: ZOFRAN Take 1 tablet (4 mg total) by mouth every 6 (six) hours as needed for nausea or vomiting.   potassium chloride 20 MEQ/15ML (10%) Soln Take 7.5 mLs (10 mEq total) by mouth daily.   psyllium 58.6 % powder Commonly known as: METAMUCIL Take 1 packet by mouth daily as needed (for constipation).   terazosin 10 MG capsule Commonly known as: HYTRIN Take 1 capsule (10 mg total) by mouth daily at 12 noon.   torsemide 20 MG tablet Commonly known as: DEMADEX Take 1 tablet (20 mg total) by mouth 2 (two) times daily.   traMADol 50 MG tablet Commonly known as: ULTRAM TAKE 1 TABLET (50 MG TOTAL) BY MOUTH IN THE MORNING, AT NOON, AND AT BEDTIME. What changed: See the new instructions.   traZODone 50 MG tablet Commonly known as: DESYREL Take 0.5 tablets (25 mg total) by mouth at bedtime.   VITAMIN D-3 PO Take 1,000 Units by mouth 2 (two) times daily.        Follow-up Information     [name], NP. Schedule an appointment as soon as possible for a visit in 1  week(s).   Specialty: Family Medicine Contact information: [place] [address] [phone]         Burnell Blanks, MD. Call.   Specialty: Cardiology Why: As needed, If symptoms worsen Contact information: [place]. [address] [place] [address] [phone]                Allergies  Allergen Reactions   Amlodipine Besy-Benazepril Hcl Cough   Hctz [Hydrochlorothiazide] Other (See Comments)    Per family, every diuretic the patient has been on flushes out her out and ultimately results in CRAMPING/JAUNDICE (added potassium might help)   Other Other (See Comments)    History of diverticulitis- CANNOT TOLERATE NUTS, CORN, AND ANY FOODS NOT EASILY DIGESTED- the patient AVOIDS these   Cyclobenzaprine Other (See Comments)    Confusion- does not tolerate well    Diclofenac Sodium Diarrhea   Hydralazine Other (See Comments)    Per family, every diuretic the patient has been on flushes out her out and ultimately results in CRAMPING/JAUNDICE (added potassium might help)   Methylprednisolone Other (See Comments)    Medrol/CORTICOSTEROIDS = A LOT OF ENERGY   Oxycodone Other (See Comments)    Caused patient to "act crazy"    Consultations: None    Procedures/Studies: DG Chest Port 1 View  Result Date: 01/30/2021 CLINICAL DATA:  CHF. EXAM: PORTABLE CHEST 1 VIEW COMPARISON:  January 29, 2021. FINDINGS: Slightly improved diffuse interstitial prominence. Slightly increased conspicuity of left upper lobe/suprahilar opacity in a region of post radiation change and left basilar opacities. No definite pleural effusions or visible pneumothorax on this single semi erect radiograph. Similar enlarged cardiac silhouette. Aortic valve replacement hand aortic calcific atherosclerosis. Polyarticular degenerative change. IMPRESSION: 1. Slightly improved diffuse interstitial prominence, suggestive of interstitial edema. Similar cardiomegaly. 2. Slightly  increased conspicuity of left upper lobe/suprahilar opacity in a region of post radiation change and left basilar opacities, which could relate to atelectasis and/or pneumonia. Electronically Signed   By: Margaretha Sheffield MD   On: 01/30/2021 06:37   DG Chest Port 1 View  Result Date: 01/29/2021 CLINICAL DATA:  Shortness of breath. EXAM: PORTABLE CHEST 1 VIEW COMPARISON:  Multiple priors, most recent Jan 10, 2021. FINDINGS: Similar diffuse interstitial prominence.  Similar more focal mixed interstitial airspace opacity in the left upper lobe/suprahilar region, described previously on CT as post radiation change. Possible small layering bilateral pleural effusions on this semi upright radiograph. Similar enlargement of the cardiac silhouette. Aortic valve replacement. Calcific atherosclerosis of the aorta. Polyarticular degenerative change. IMPRESSION: 1. Similar cardiomegaly and diffuse interstitial prominence, suggestive of interstitial pulmonary edema. 2. Possible small layering bilateral pleural effusions. 3. Similar more focal mixed interstitial airspace opacity in left upper lobe/suprahilar region, described previously on CT as post radiation change. Electronically Signed   By: Margaretha Sheffield MD   On: 01/29/2021 07:30   DG Chest Portable 1 View  Result Date: 01/10/2021 CLINICAL DATA:  Shortness of breath. EXAM: PORTABLE CHEST 1 VIEW COMPARISON:  Jan 04, 2021 FINDINGS: Stable enlarged cardiac silhouette.  Aortic valve prosthesis. Diffuse bilateral interstitial thickening. Streaky posttreatment scarring in the left upper lobe is unchanged. Probable bilateral subpulmonic effusions. Osseous structures are without acute abnormality. Soft tissues are grossly normal. IMPRESSION: 1. Enlarged cardiac silhouette with interstitial pulmonary edema. 2. Probable bilateral subpulmonic effusions. Electronically Signed   By: Fidela Salisbury M.D.   On: 01/10/2021 20:11   DG Chest Port 1 View  Result Date:  01/04/2021 CLINICAL DATA:  Shortness of breath EXAM: PORTABLE CHEST 1 VIEW COMPARISON:  12/25/2020 FINDINGS: Mild cardiomegaly. Diffuse interstitial opacity. No pneumothorax or sizable pleural effusion. No focal airspace consolidation. Prosthetic aortic valve. Unchanged upper left lung scarring. IMPRESSION: Cardiomegaly and diffuse interstitial opacity likely indicating mild pulmonary edema. Electronically Signed   By: Ulyses Jarred M.D.   On: 01/04/2021 03:53       Discharge Exam: Vitals:   01/31/21 0549 01/31/21 0758  BP: 124/74 132/72  Pulse: 72 88  Resp: 18 18  Temp: 98.1 F (36.7 C) 97.7 F (36.5 C)  SpO2: 96% 94%    General: Pt is alert, awake, not in acute distress Cardiovascular: RRR, S1/S2 +, no edema Respiratory: CTA bilaterally, no wheezing, no rhonchi, no respiratory distress, no conversational dyspnea, on room air Abdominal: Soft, NT, ND, bowel sounds + Extremities: no edema, no cyanosis Psych: Normal mood and affect, stable judgement and insight     The results of significant diagnostics from this hospitalization (including imaging, microbiology, ancillary and laboratory) are listed below for reference.     Microbiology: Recent Results (from the past 240 hour(s))  Resp Panel by RT-PCR (Flu A&B, Covid) Nasopharyngeal Swab     Status: None   Collection Time: 01/29/21  6:37 AM   Specimen: Nasopharyngeal Swab; Nasopharyngeal(NP) swabs in vial transport medium  Result Value Ref Range Status   SARS Coronavirus 2 by RT PCR NEGATIVE NEGATIVE Final    Comment: (NOTE) SARS-CoV-2 target nucleic acids are NOT DETECTED.  The SARS-CoV-2 RNA is generally detectable in upper respiratory specimens during the acute phase of infection. The lowest concentration of SARS-CoV-2 viral copies this assay can detect is 138 copies/mL. A negative result does not preclude SARS-Cov-2 infection and should not be used as the sole basis for treatment or other patient management decisions. A  negative result may occur with  improper specimen collection/handling, submission of specimen other than nasopharyngeal swab, presence of viral mutation(s) within the areas targeted by this assay, and inadequate number of viral copies(<138 copies/mL). A negative result must be combined with clinical observations, patient history, and epidemiological information. The expected result is Negative.  Fact Sheet for Patients:  EntrepreneurPulse.com.au  Fact Sheet for Healthcare Providers:  IncredibleEmployment.be  This test is no t yet approved  or cleared by the [place] and  has been authorized for detection and/or diagnosis of SARS-CoV-2 by FDA under an Emergency Use Authorization (EUA). This EUA will remain  in effect (meaning this test can be used) for the duration of the COVID-19 declaration under Section 564(b)(1) of the Act, 21 U.S.C.section 360bbb-3(b)(1), unless the authorization is terminated  or revoked sooner.       Influenza A by PCR NEGATIVE NEGATIVE Final   Influenza B by PCR NEGATIVE NEGATIVE Final    Comment: (NOTE) The Xpert Xpress SARS-CoV-2/FLU/RSV plus assay is intended as an aid in the diagnosis of influenza from Nasopharyngeal swab specimens and should not be used as a sole basis for treatment. Nasal washings and aspirates are unacceptable for Xpert Xpress SARS-CoV-2/FLU/RSV testing.  Fact Sheet for Patients: EntrepreneurPulse.com.au  Fact Sheet for Healthcare Providers: IncredibleEmployment.be  This test is not yet approved or cleared by the [place] FDA and has been authorized for detection and/or diagnosis of SARS-CoV-2 by FDA under an Emergency Use Authorization (EUA). This EUA will remain in effect (meaning this test can be used) for the duration of the COVID-19 declaration under Section 564(b)(1) of the Act, 21 U.S.C. section 360bbb-3(b)(1), unless the authorization  is terminated or revoked.  Performed at [facility], [address] [place], [place] [address]      Labs: BNP (last 3 results) Recent Labs    01/04/21 0309 01/10/21 1934 01/29/21 0636  BNP 1,852.7* 1,752.8* 3,762.8*   Basic Metabolic Panel: Recent Labs  Lab 01/29/21 0636 01/30/21 0137 01/31/21 0322  NA 140 140 138  K 3.6 3.7 3.6  CL 104 104 103  CO2 23 23 24   GLUCOSE 156* 91 132*  BUN 22 21 24*  CREATININE 0.81 0.85 0.89  CALCIUM 9.1 8.8* 9.3   Liver Function Tests: Recent Labs  Lab 01/29/21 0636  AST 24  ALT 13  ALKPHOS 37*  BILITOT 1.1  PROT 6.7  ALBUMIN 3.8   No results for input(s): LIPASE, AMYLASE in the last 168 hours. No results for input(s): AMMONIA in the last 168 hours. CBC: Recent Labs  Lab 01/29/21 0636  WBC 6.6  NEUTROABS 5.8  HGB 10.7*  HCT 34.2*  MCV 89.3  PLT 345   Cardiac Enzymes: No results for input(s): CKTOTAL, CKMB, CKMBINDEX, TROPONINI in the last 168 hours. BNP: Invalid input(s): POCBNP CBG: No results for input(s): GLUCAP in the last 168 hours. D-Dimer No results for input(s): DDIMER in the last 72 hours. Hgb A1c No results for input(s): HGBA1C in the last 72 hours. Lipid Profile No results for input(s): CHOL, HDL, LDLCALC, TRIG, CHOLHDL, LDLDIRECT in the last 72 hours. Thyroid function studies No results for input(s): TSH, T4TOTAL, T3FREE, THYROIDAB in the last 72 hours.  Invalid input(s): FREET3 Anemia work up No results for input(s): VITAMINB12, FOLATE, FERRITIN, TIBC, IRON, RETICCTPCT in the last 72 hours. Urinalysis    Component Value Date/Time   COLORURINE YELLOW 09/01/2020 1403   APPEARANCEUR HAZY (A) 09/01/2020 1403   LABSPEC 1.019 09/01/2020 1403   PHURINE 6.0 09/01/2020 1403   GLUCOSEU NEGATIVE 09/01/2020 1403   GLUCOSEU NEGATIVE 12/11/2019 1033   HGBUR NEGATIVE 09/01/2020 [place] 09/01/2020 1403   [place] 09/01/2020 1403   PROTEINUR 30 (A)  09/01/2020 1403   UROBILINOGEN 0.2 12/11/2019 1033   NITRITE NEGATIVE 09/01/2020 1403   LEUKOCYTESUR TRACE (A) 09/01/2020 1403   Sepsis Labs Invalid input(s): PROCALCITONIN,  WBC,  LACTICIDVEN Microbiology Recent Results (from  the past 240 hour(s))  Resp Panel by RT-PCR (Flu A&B, Covid) Nasopharyngeal Swab     Status: None   Collection Time: 01/29/21  6:37 AM   Specimen: Nasopharyngeal Swab; Nasopharyngeal(NP) swabs in vial transport medium  Result Value Ref Range Status   SARS Coronavirus 2 by RT PCR NEGATIVE NEGATIVE Final    Comment: (NOTE) SARS-CoV-2 target nucleic acids are NOT DETECTED.  The SARS-CoV-2 RNA is generally detectable in upper respiratory specimens during the acute phase of infection. The lowest concentration of SARS-CoV-2 viral copies this assay can detect is 138 copies/mL. A negative result does not preclude SARS-Cov-2 infection and should not be used as the sole basis for treatment or other patient management decisions. A negative result may occur with  improper specimen collection/handling, submission of specimen other than nasopharyngeal swab, presence of viral mutation(s) within the areas targeted by this assay, and inadequate number of viral copies(<138 copies/mL). A negative result must be combined with clinical observations, patient history, and epidemiological information. The expected result is Negative.  Fact Sheet for Patients:  EntrepreneurPulse.com.au  Fact Sheet for Healthcare Providers:  IncredibleEmployment.be  This test is no t yet approved or cleared by the [place] FDA and  has been authorized for detection and/or diagnosis of SARS-CoV-2 by FDA under an Emergency Use Authorization (EUA). This EUA will remain  in effect (meaning this test can be used) for the duration of the COVID-19 declaration under Section 564(b)(1) of the Act, 21 U.S.C.section 360bbb-3(b)(1), unless the authorization is  terminated  or revoked sooner.       Influenza A by PCR NEGATIVE NEGATIVE Final   Influenza B by PCR NEGATIVE NEGATIVE Final    Comment: (NOTE) The Xpert Xpress SARS-CoV-2/FLU/RSV plus assay is intended as an aid in the diagnosis of influenza from Nasopharyngeal swab specimens and should not be used as a sole basis for treatment. Nasal washings and aspirates are unacceptable for Xpert Xpress SARS-CoV-2/FLU/RSV testing.  Fact Sheet for Patients: EntrepreneurPulse.com.au  Fact Sheet for Healthcare Providers: IncredibleEmployment.be  This test is not yet approved or cleared by the [place] FDA and has been authorized for detection and/or diagnosis of SARS-CoV-2 by FDA under an Emergency Use Authorization (EUA). This EUA will remain in effect (meaning this test can be used) for the duration of the COVID-19 declaration under Section 564(b)(1) of the Act, 21 U.S.C. section 360bbb-3(b)(1), unless the authorization is terminated or revoked.  Performed at [facility], [address], [place], [place] [address]      Patient was seen and examined on the day of discharge and was found to be in stable condition. Time coordinating discharge: 25 minutes including assessment and coordination of care, as well as examination of the patient.   SIGNED:  Dessa Phi, DO [facility] 01/31/2021, 10:34 AM

## 2021-01-31 NOTE — Progress Notes (Signed)
Pt has been D/C today. Received referral to assist with [facility] PT/OT. Pt reports that she resides at [facility] and she was active with [facility] a couple of months ago for therapy. Contacted Regina with [employer] for [employer] PT/OT referral. [facility] provides outpt therapy to [place]. Faxed clinical to [facility].

## 2021-02-02 ENCOUNTER — Telehealth: Payer: Self-pay | Admitting: Adult Health

## 2021-02-02 NOTE — Telephone Encounter (Signed)
Ok for orders? 

## 2021-02-02 NOTE — Telephone Encounter (Signed)
Tara Fields is calling in needing verbal orders for skill nursing for 1 week 9.  May leave a detail msg on the secured voice mail

## 2021-02-03 ENCOUNTER — Telehealth: Payer: Self-pay

## 2021-02-03 ENCOUNTER — Encounter: Payer: Self-pay | Admitting: Adult Health

## 2021-02-03 NOTE — Telephone Encounter (Signed)
Transition Care Management Follow-up Telephone Call Date of discharge and from where: 01/31/2021  [facility]  How have you been since you were released from the hospital? Resting  Any questions or concerns? No  Items Reviewed: Did the pt receive and understand the discharge instructions provided? Yes  Medications obtained and verified? Yes  Other? No  Any new allergies since your discharge? No  Dietary orders reviewed? Yes Do you have support at home? Yes   Home Care and Equipment/Supplies: Were home health services ordered? yes If so, what is the name of the agency? [facility]   Has the agency set up a time to come to the patient's home? yes Were any new equipment or medical supplies ordered?  No What is the name of the medical supply agency? N/A Were you able to get the supplies/equipment? not applicable Do you have any questions related to the use of the equipment or supplies? No  Functional Questionnaire: (I = Independent and D = Dependent)  ADLs: d  Bathing/Dressing- I  Meal Prep- D  Eating- I  Maintaining continence- I  Transferring/Ambulation- I  Managing Meds- D  Follow up appointments reviewed:  PCP Hospital f/u appt confirmed? Yes  Scheduled to see [name] on 02/18/2021 @ 0900. [facility] f/u appt confirmed? No ,needed per son Louie Casa  Are transportation arrangements needed? No  If their condition worsens, is the pt aware to call PCP or go to the Emergency Dept.? Yes Was the patient provided with contact information for the PCP's office or ED? Yes Was to pt encouraged to call back with questions or concerns? Yes

## 2021-02-03 NOTE — Telephone Encounter (Signed)
Transition Care Management Unsuccessful Follow-up Telephone Call  Date of discharge and from where:  01/31/2021  Tara Fields   Attempts:  1st Attempt  Reason for unsuccessful TCM follow-up call:  No answer/busy

## 2021-02-03 NOTE — Telephone Encounter (Signed)
Left a detailed message on verified voice mail giving the ok for orders. Nothing further needed.

## 2021-02-17 ENCOUNTER — Other Ambulatory Visit: Payer: Self-pay

## 2021-02-18 ENCOUNTER — Encounter: Payer: Self-pay | Admitting: Adult Health

## 2021-02-18 ENCOUNTER — Ambulatory Visit (INDEPENDENT_AMBULATORY_CARE_PROVIDER_SITE_OTHER): Payer: Medicare Other | Admitting: Adult Health

## 2021-02-18 VITALS — BP 120/50 | HR 91 | Temp 97.1°F | Ht 60.0 in | Wt 139.0 lb

## 2021-02-18 DIAGNOSIS — I5023 Acute on chronic systolic (congestive) heart failure: Secondary | ICD-10-CM

## 2021-02-18 DIAGNOSIS — I4819 Other persistent atrial fibrillation: Secondary | ICD-10-CM

## 2021-02-18 DIAGNOSIS — E785 Hyperlipidemia, unspecified: Secondary | ICD-10-CM | POA: Diagnosis not present

## 2021-02-18 DIAGNOSIS — I10 Essential (primary) hypertension: Secondary | ICD-10-CM

## 2021-02-18 LAB — COMPREHENSIVE METABOLIC PANEL
ALT: 18 U/L (ref 0–35)
AST: 30 U/L (ref 0–37)
Albumin: 4 g/dL (ref 3.5–5.2)
Alkaline Phosphatase: 38 U/L — ABNORMAL LOW (ref 39–117)
BUN: 41 mg/dL — ABNORMAL HIGH (ref 6–23)
CO2: 30 mEq/L (ref 19–32)
Calcium: 9.4 mg/dL (ref 8.4–10.5)
Chloride: 98 mEq/L (ref 96–112)
Creatinine, Ser: 1.24 mg/dL — ABNORMAL HIGH (ref 0.40–1.20)
GFR: 38.18 mL/min — ABNORMAL LOW (ref >60.00)
Glucose, Bld: 103 mg/dL — ABNORMAL HIGH (ref 70–99)
Potassium: 4.2 mEq/L (ref 3.5–5.1)
Sodium: 137 mEq/L (ref 135–145)
Total Bilirubin: 0.8 mg/dL (ref 0.2–1.2)
Total Protein: 6.5 g/dL (ref 6.0–8.3)

## 2021-02-18 NOTE — Progress Notes (Signed)
Subjective:    Patient ID: Tara Fields, female    DOB: [birth year], 87 y.o.   MRN: [id number]  HPI 87 year old female who  has a past medical history of Aortic stenosis, CAD (coronary artery disease), COPD (chronic obstructive pulmonary disease) ([place]), Diabetes mellitus ([place]), Diverticulitis, GERD (gastroesophageal reflux disease), HTN (hypertension), Hyperlipidemia, Legally blind in left eye, as defined in [place], and Tobacco abuse.  She presents to the office today for TCM visit   Admit Date 01/29/2021 Discharge Date 01/31/2021  She presented to the emergency room with increasing shortness of breath and leg swelling.  He was hospitalized 1 month ago for CHF after decompensation, discharged home with Lasix once a day.  However patient soon developed another episode of CHF decompensation, at this time cardiology increased her Lasix from 20 mg daily to 20 mg twice daily.  The patient's son reported that the patient appears to continue to accumulate fluid in her ankles despite being compliant with twice daily Lasix.  And the patient reports that Lasix makes her feel nauseous.  Both patient's son and patient reported that it seems that the patient does not urinate creasing amount after taking Lasix either morning or evening doses.  No chest pain or cough was present during this ER visit.  She was diuresed with IV Lasix with good response.  Pedal edema resolved, patient's breathing stabilized.  Repeat chest x-ray improved.  Due to patient's complaint of nausea with oral Lasix and potassium dose her diuretic regimen was changed to torsemide and potassium liquid form.  Hospital Course  Acute on chronic systolic CHF with severe aortic stenosis status post TAVR -Follows with cardiology, Dr. Angelena Form, he had declined ischemic evaluation back in May -BNP one 896.8 -Transition from IV Lasix to torsemide -Advised strict I's and O's, daily weight, fluid restriction diet -Her chest x-ray revealed  slightly improved diffuse interstitial prominence -Was on room air upon discharge  Chronic A. Fib -Continued with Eliquis and Lopressor  Hyperlipidemia -Continue with Lipitor   Hypertension -Continue torsemide, Cozaar, Lopressor  Today she reports that since switching the diuretic and potassium supplement she no longer has nausea or swelling in the legs. She is trying to abide by fluid restrictions. Her weight has been stable at home around 138-139 lbs  Wt Readings from Last 3 Encounters:  02/18/21 139 lb (63 kg)  01/31/21 139 lb 12.8 oz (63.4 kg)  01/16/21 146 lb (66.2 kg)    Review of Systems  Constitutional: Negative.   Respiratory: Negative.    Cardiovascular: Negative.   Genitourinary:  Positive for frequency and urgency.  Neurological: Negative.   Psychiatric/Behavioral: Negative.    All other systems reviewed and are negative. Past Medical History:  Diagnosis Date   Aortic stenosis    s/p TAVR 26 mm Edwards Sapien 3 THV July 2020   CAD (coronary artery disease)    COPD (chronic obstructive pulmonary disease) (HCC)    Diabetes mellitus (H[facility])    Diverticulitis    GERD (gastroesophageal reflux disease)    HTN (hypertension)    Hyperlipidemia    Legally blind in left eye, as defined in [place]    Tobacco abuse     Social History   Socioeconomic History   Marital status: Widowed    Spouse name: Not on file   Number of children: 2   Years of education: Not on file   Highest education level: High school graduate  Occupational History   Occupation: Retired    Comment: homemaker  Tobacco Use   Smoking status: Former    Packs/day: 1.00    Years: 70.00    Pack years: 70.00    Types: Cigarettes    Quit date: 12/15/2019    Years since quitting: 1.1   Smokeless tobacco: Never   Tobacco comments:    Pt states quiting a year ago. 01/03/20 ARJ  Vaping Use   Vaping Use: Never used  Substance and Sexual Activity   Alcohol use: No    Alcohol/week: 0.0 standard drinks    Drug use: No   Sexual activity: Not Currently  Other Topics Concern   Not on file  Social History Narrative   Widowed- was married for 49 years    Has two sons       Social Determinants of Radio broadcast assistant Strain: Low Risk    Difficulty of Paying Living Expenses: Not hard at all  Food Insecurity: No Food Insecurity   Worried About Charity fundraiser in the Last Year: Never true   Arboriculturist in the Last Year: Never true  Transportation Needs: No Transportation Needs   Lack of Transportation (Medical): No   Lack of Transportation (Non-Medical): No  Physical Activity: Not on file  Stress: Not on file  Social Connections: Not on file  Intimate Partner Violence: Not on file    Past Surgical History:  Procedure Laterality Date   ABDOMINAL HYSTERECTOMY     APPENDECTOMY     BACK SURGERY     CATARACT EXTRACTION, BILATERAL     CHOLECYSTECTOMY      Family History  Problem Relation Age of Onset   Diabetes Mother    Stroke Brother    Cerebral palsy Brother    Coronary artery disease Neg Hx     Allergies  Allergen Reactions   Amlodipine Besy-Benazepril Hcl Cough   Hctz [Hydrochlorothiazide] Other (See Comments)    Per family, every diuretic the patient has been on flushes out her out and ultimately results in CRAMPING/JAUNDICE (added potassium might help)   Other Other (See Comments)    History of diverticulitis- CANNOT TOLERATE NUTS, CORN, AND ANY FOODS NOT EASILY DIGESTED- the patient AVOIDS these   Cyclobenzaprine Other (See Comments)    Confusion- does not tolerate well    Diclofenac Sodium Diarrhea   Hydralazine Other (See Comments)    Per family, every diuretic the patient has been on flushes out her out and ultimately results in CRAMPING/JAUNDICE (added potassium might help)   Methylprednisolone Other (See Comments)    Medrol/CORTICOSTEROIDS = A LOT OF ENERGY   Oxycodone Other (See Comments)    Caused patient to "act crazy"    Current  Outpatient Medications on File Prior to Visit  Medication Sig Dispense Refill   acetaminophen (TYLENOL) 500 MG tablet Take 500 mg by mouth every 6 (six) hours as needed for mild pain (or headaches).     aspirin 81 MG chewable tablet Chew 1 tablet (81 mg total) by mouth daily. 90 tablet 3   atorvastatin (LIPITOR) 40 MG tablet TAKE 1 TABLET BY MOUTH DAILY AT 6 PM. 90 tablet 1   Cholecalciferol (VITAMIN D-3 PO) Take 1,000 Units by mouth 2 (two) times daily.     Coenzyme Q10 (CO Q-10) 300 MG CAPS Take 300 mg by mouth in the morning.     ELIQUIS 5 MG TABS tablet TAKE 1 TABLET BY MOUTH TWICE A DAY 180 tablet 1   fenofibrate 160 MG tablet TAKE 1 TABLET BY  MOUTH EVERY DAY 90 tablet 1   ferrous gluconate (FERGON) 324 MG tablet Take 1 tablet (324 mg total) by mouth daily with breakfast. 90 tablet 1   losartan (COZAAR) 100 MG tablet TAKE 1 TABLET BY MOUTH EVERY DAY 90 tablet 3   Magnesium 200 MG TABS Take 1 tablet by mouth daily at 6 (six) AM.     metoprolol tartrate (LOPRESSOR) 50 MG tablet TAKE 1 TABLET BY MOUTH TWICE A DAY 180 tablet 1   Multiple Vitamins-Minerals (CENTRUM SILVER 50+WOMEN) TABS Take 1 tablet by mouth daily with breakfast.     NASAL SPRAY SALINE NA Place 1 spray into both nostrils as needed (for congestion).     Omega-3 Fatty Acids (FISH OIL) 1200 MG CAPS Take 1,200 mg by mouth in the morning.     ondansetron (ZOFRAN) 4 MG tablet Take 1 tablet (4 mg total) by mouth every 6 (six) hours as needed for nausea or vomiting. 30 tablet 0   potassium chloride 20 MEQ/15ML (10%) SOLN Take 7.5 mLs (10 mEq total) by mouth daily. 225 mL 2   psyllium (METAMUCIL) 58.6 % powder Take 1 packet by mouth daily as needed (for constipation).     terazosin (HYTRIN) 10 MG capsule Take 1 capsule (10 mg total) by mouth daily at 12 noon. 90 capsule 1   torsemide (DEMADEX) 20 MG tablet Take 1 tablet (20 mg total) by mouth 2 (two) times daily. 60 tablet 2   traMADol (ULTRAM) 50 MG tablet TAKE 1 TABLET (50 MG TOTAL)  BY MOUTH IN THE MORNING, AT NOON, AND AT BEDTIME. 90 tablet 2   traZODone (DESYREL) 50 MG tablet Take 0.5 tablets (25 mg total) by mouth at bedtime. 15 tablet 0   No current facility-administered medications on file prior to visit.    BP (!) 120/50   Pulse 91   Temp (!) 97.1 F (36.2 C) (Oral)   Ht 5' (1.524 m)   Wt 139 lb (63 kg)   SpO2 97%   BMI 27.15 kg/m       Objective:   Physical Exam Vitals and nursing note reviewed.  Constitutional:      Appearance: Normal appearance.  Cardiovascular:     Rate and Rhythm: Normal rate and regular rhythm.     Pulses: Normal pulses.     Heart sounds: Normal heart sounds.  Pulmonary:     Effort: Pulmonary effort is normal.     Breath sounds: Normal breath sounds.  Musculoskeletal:     Right lower leg: No edema.     Left lower leg: No edema.  Skin:    General: Skin is warm and dry.  Neurological:     General: No focal deficit present.     Mental Status: She is alert and oriented to person, place, and time.  Psychiatric:        Mood and Affect: Mood normal.        Behavior: Behavior normal.        Thought Content: Thought content normal.        Judgment: Judgment normal.      Assessment & Plan:  1. Acute on chronic systolic congestive heart failure [facility]) - Reviewed hospital notes, discharge instructions, and follow up instructions. All questions answered to the best of my ability.  - Torsemide seems to be working well for her with no adverse symptoms - Comprehensive metabolic panel  2. Dyslipidemia - continue with statin and fenofibrate  - Comprehensive metabolic panel  3. Essential  hypertension - controlled. Continue to with current medication  - Comprehensive metabolic panel  4. Other persistent atrial fibrillation ([facility] [place]) - Continue with eliquis and lopressor  - Comprehensive metabolic panel   [name], NP

## 2021-02-19 ENCOUNTER — Other Ambulatory Visit: Payer: Self-pay | Admitting: Adult Health

## 2021-02-19 DIAGNOSIS — N289 Disorder of kidney and ureter, unspecified: Secondary | ICD-10-CM

## 2021-03-04 ENCOUNTER — Other Ambulatory Visit: Payer: Self-pay

## 2021-03-04 ENCOUNTER — Ambulatory Visit
Admission: RE | Admit: 2021-03-04 | Discharge: 2021-03-04 | Disposition: A | Discharge status: Home / Self Care | Payer: Medicare Other | Source: Ambulatory Visit | Attending: Emergency Medicine | Admitting: Emergency Medicine

## 2021-03-04 DIAGNOSIS — R911 Solitary pulmonary nodule: Secondary | ICD-10-CM

## 2021-03-05 ENCOUNTER — Other Ambulatory Visit (INDEPENDENT_AMBULATORY_CARE_PROVIDER_SITE_OTHER): Payer: Medicare Other

## 2021-03-05 DIAGNOSIS — N289 Disorder of kidney and ureter, unspecified: Secondary | ICD-10-CM

## 2021-03-05 LAB — BASIC METABOLIC PANEL
BUN: 48 mg/dL — ABNORMAL HIGH (ref 6–23)
CO2: 30 mEq/L (ref 19–32)
Calcium: 9 mg/dL (ref 8.4–10.5)
Chloride: 100 mEq/L (ref 96–112)
Creatinine, Ser: 1.19 mg/dL (ref 0.40–1.20)
GFR: 40.1 mL/min — ABNORMAL LOW (ref >60.00)
Glucose, Bld: 118 mg/dL — ABNORMAL HIGH (ref 70–99)
Potassium: 3.6 mEq/L (ref 3.5–5.1)
Sodium: 140 mEq/L (ref 135–145)

## 2021-03-08 ENCOUNTER — Other Ambulatory Visit: Payer: Self-pay | Admitting: Adult Health

## 2021-03-08 DIAGNOSIS — M8949 Other hypertrophic osteoarthropathy, multiple sites: Secondary | ICD-10-CM

## 2021-03-08 DIAGNOSIS — I10 Essential (primary) hypertension: Secondary | ICD-10-CM

## 2021-03-09 NOTE — Telephone Encounter (Signed)
Ok to refill all. Medication allergies flagging in multiple Rx. Please advise

## 2021-03-17 ENCOUNTER — Ambulatory Visit (INDEPENDENT_AMBULATORY_CARE_PROVIDER_SITE_OTHER): Payer: Medicare Other

## 2021-03-17 DIAGNOSIS — Z0000 Encounter for general adult medical examination without abnormal findings: Secondary | ICD-10-CM | POA: Diagnosis not present

## 2021-03-17 NOTE — Progress Notes (Signed)
Subjective:   Tara Fields is a 87 y.o. female who presents for Medicare Annual (Subsequent) preventive examination.   I connected with [name] today by telephone and verified that I am speaking with the correct person using two identifiers. Location patient: home Location provider: work Persons participating in the virtual visit: patient, provider.   I discussed the limitations, risks, security and privacy concerns of performing an evaluation and management service by telephone and the availability of in person appointments. I also discussed with the patient that there may be a patient responsible charge related to this service. The patient expressed understanding and verbally consented to this telephonic visit.    Interactive audio and video telecommunications were attempted between this provider and patient, however failed, due to patient having technical difficulties OR patient did not have access to video capability.  We continued and completed visit with audio only.    Review of Systems    N/a       Objective:    There were no vitals filed for this visit. There is no height or weight on file to calculate BMI.  Advanced Directives 01/29/2021 01/10/2021 01/04/2021 12/18/2020 12/18/2020 11/10/2020 11/08/2020  Does Patient Have a Medical Advance Directive? No No No;Yes Yes Yes Yes Yes  Type of Advance Directive - - Out of facility DNR (pink MOST or yellow form) Healthcare Power of [place]  Does patient want to make changes to medical advance directive? - - - No - Patient declined - No - Patient declined Yes (ED - Information included in AVS)  Copy of [place] in Chart? - - - No - copy requested - No - copy requested No - copy requested  Would patient like information on creating a medical advance directive? No - Patient declined - - - - - -    Current Medications (verified) Outpatient  Encounter Medications as of 03/17/2021  Medication Sig   acetaminophen (TYLENOL) 500 MG tablet Take 500 mg by mouth every 6 (six) hours as needed for mild pain (or headaches).   amLODipine (NORVASC) 5 MG tablet TAKE 1 TABLET BY MOUTH EVERY DAY   atorvastatin (LIPITOR) 40 MG tablet TAKE 1 TABLET BY MOUTH DAILY AT 6 PM.   Cholecalciferol (VITAMIN D-3 PO) Take 1,000 Units by mouth 2 (two) times daily.   Coenzyme Q10 (CO Q-10) 300 MG CAPS Take 300 mg by mouth in the morning.   [employer] ASPIRIN ADULT LOW DOSE 81 MG chewable tablet CHEW 1 TABLET (81 MG TOTAL) BY MOUTH DAILY.   ELIQUIS 5 MG TABS tablet TAKE 1 TABLET BY MOUTH TWICE A DAY   fenofibrate 160 MG tablet TAKE 1 TABLET BY MOUTH EVERY DAY   ferrous gluconate (FERGON) 324 MG tablet TAKE 1 TABLET BY MOUTH DAILY WITH BREAKFAST   losartan (COZAAR) 100 MG tablet TAKE 1 TABLET BY MOUTH EVERY DAY   Magnesium 200 MG TABS Take 1 tablet by mouth daily at 6 (six) AM.   metoprolol tartrate (LOPRESSOR) 50 MG tablet TAKE 1 TABLET BY MOUTH TWICE A DAY   Multiple Vitamins-Minerals (CENTRUM SILVER 50+WOMEN) TABS Take 1 tablet by mouth daily with breakfast.   NASAL SPRAY SALINE NA Place 1 spray into both nostrils as needed (for congestion).   Omega-3 Fatty Acids (FISH OIL) 1200 MG CAPS Take 1,200 mg by mouth in the morning.   potassium chloride 20 MEQ/15ML (10%) SOLN Take 7.5 mLs (10 mEq total) by mouth daily.  psyllium (METAMUCIL) 58.6 % powder Take 1 packet by mouth daily as needed (for constipation).   terazosin (HYTRIN) 10 MG capsule Take 1 capsule (10 mg total) by mouth daily at 12 noon.   torsemide (DEMADEX) 20 MG tablet Take 1 tablet (20 mg total) by mouth 2 (two) times daily.   traMADol (ULTRAM) 50 MG tablet TAKE 1 TABLET (50 MG TOTAL) BY MOUTH IN THE MORNING, AT NOON, AND AT BEDTIME.   traZODone (DESYREL) 50 MG tablet Take 0.5 tablets (25 mg total) by mouth at bedtime.   No facility-administered encounter medications on file as of 03/17/2021.    Allergies  (verified) Amlodipine besy-benazepril hcl, Hctz [hydrochlorothiazide], Other, Cyclobenzaprine, Diclofenac sodium, Hydralazine, Methylprednisolone, and Oxycodone   History: Past Medical History:  Diagnosis Date   Aortic stenosis    s/p TAVR 26 mm Edwards Sapien 3 THV July 2020   CAD (coronary artery disease)    COPD (chronic obstructive pulmonary disease) (HCC)    Diabetes mellitus (HCC)    Diverticulitis    GERD (gastroesophageal reflux disease)    HTN (hypertension)    Hyperlipidemia    Legally blind in left eye, as defined in [place]    Tobacco abuse    Past Surgical History:  Procedure Laterality Date   ABDOMINAL HYSTERECTOMY     APPENDECTOMY     BACK SURGERY     CATARACT EXTRACTION, BILATERAL     CHOLECYSTECTOMY     Family History  Problem Relation Age of Onset   Diabetes Mother    Stroke Brother    Cerebral palsy Brother    Coronary artery disease Neg Hx    Social History   Socioeconomic History   Marital status: Widowed    Spouse name: Not on file   Number of children: 2   Years of education: Not on file   Highest education level: High school graduate  Occupational History   Occupation: Retired    Comment: homemaker  Tobacco Use   Smoking status: Former    Packs/day: 1.00    Years: 70.00    Pack years: 70.00    Types: Cigarettes    Quit date: 12/15/2019    Years since quitting: 1.2   Smokeless tobacco: Never   Tobacco comments:    Pt states quiting a year ago. 01/03/20 ARJ  Vaping Use   Vaping Use: Never used  Substance and Sexual Activity   Alcohol use: No    Alcohol/week: 0.0 standard drinks   Drug use: No   Sexual activity: Not Currently  Other Topics Concern   Not on file  Social History Narrative   Widowed- was married for 17 years    Has two sons       Social Determinants of Radio broadcast assistant Strain: Low Risk    Difficulty of Paying Living Expenses: Not hard at all  Food Insecurity: No Food Insecurity   Worried About Ship broker in the Last Year: Never true   Arboriculturist in the Last Year: Never true  Transportation Needs: No Transportation Needs   Lack of Transportation (Medical): No   Lack of Transportation (Non-Medical): No  Physical Activity: Not on file  Stress: Not on file  Social Connections: Not on file    Tobacco Counseling Counseling given: Not Answered Tobacco comments: Pt states quiting a year ago. 01/03/20 ARJ   Clinical Intake:                 Diabetic?yes  Nutrition Risk Assessment:  Has the patient had any N/V/D within the last 2 months?  No  Does the patient have any non-healing wounds?  No  Has the patient had any unintentional weight loss or weight gain?  No   Diabetes:  Is the patient diabetic?  Yes  If diabetic, was a CBG obtained today?  No  Did the patient bring in their glucometer from home?  No  How often do you monitor your CBG's? never.   Financial Strains and Diabetes Management:  Are you having any financial strains with the device, your supplies or your medication? No .  Does the patient want to be seen by Chronic Care Management for management of their diabetes?  No  Would the patient like to be referred to a Nutritionist or for Diabetic Management?  No   Diabetic Exams:  Diabetic Eye Exam: Completed 02/2020 Diabetic Foot Exam: Overdue, Pt has been advised about the importance in completing this exam. Pt is scheduled for diabetic foot exam on next office visit .          Activities of Daily Living In your present state of health, do you have any difficulty performing the following activities: 12/18/2020 09/02/2020  Hearing? Tempie Donning  Vision? Y N  Difficulty concentrating or making decisions? Y N  Walking or climbing stairs? Y N  Dressing or bathing? Y N  Doing errands, shopping? N -  Some recent data might be hidden    Patient Care Team: [name], NP as PCP - General (Family Medicine) Burnell Blanks, MD as PCP - Cardiology  (Cardiology) Special[facility], [place] (Orthopedic Surgery)  Indicate any recent Medical Services you may have received from other than [facility] providers in the past year (date may be approximate).     Assessment:   This is a routine wellness examination for Brandalyn.  Hearing/Vision screen No results found.  Dietary issues and exercise activities discussed:     Goals Addressed   None    Depression Screen PHQ 2/9 Scores 02/18/2021 02/21/2019 01/25/2018  PHQ - 2 Score 0 0 0    Fall Risk Fall Risk  02/18/2021 02/21/2019 01/25/2018  Falls in the past year? 1 0 No  Number falls in past yr: 0 - -  Injury with Fall? 0 - -    FALL RISK PREVENTION PERTAINING TO THE HOME:  Any stairs in or around the home? No  If so, are there any without handrails? No  Home free of loose throw rugs in walkways, pet beds, electrical cords, etc? Yes  Adequate lighting in your home to reduce risk of falls? Yes   ASSISTIVE DEVICES UTILIZED TO PREVENT FALLS:  Life alert? No  Use of a cane, walker or w/c? No  Grab bars in the bathroom? Yes  Shower chair or bench in shower? Yes  Elevated toilet seat or a handicapped toilet? No    Cognitive Function: Normal cognitive status assessed by direct observation by this Nurse Health Advisor. No abnormalities found.   MMSE - Mini Mental State Exam 12/25/2020  Orientation to time 5  Orientation to Place 5  Registration 3  Attention/ Calculation 5  Recall 2  Language- name 2 objects 2  Language- repeat 1  Language- follow 3 step command 3  Language- read & follow direction 1  Write a sentence 1  Copy design 0  Total score 28        Immunizations Immunization History  Administered Date(s) Administered   Fluad  Quad(high Dose 65+) 05/03/2019, 05/29/2020   Influenza Split 06/16/2005, 06/03/2006, 06/01/2007, 04/30/2008, 05/13/2009, 05/30/2010, 05/27/2011, 08/24/2011, 06/01/2012, 05/31/2013, 05/03/2014   Influenza,inj,Quad PF,6+ Mos 07/02/2014    PFIZER(Purple Top)SARS-COV-2 Vaccination 08/29/2019, 09/18/2019, 05/17/2020   Pneumococcal Conjugate-13 10/17/2013   Pneumococcal Polysaccharide-23 08/16/1996, 06/23/2010   Zoster, Live 05/04/2011    TDAP status: Due, Education has been provided regarding the importance of this vaccine. Advised may receive this vaccine at local pharmacy or Health Dept. Aware to provide a copy of the vaccination record if obtained from local pharmacy or Health Dept. Verbalized acceptance and understanding.  Flu Vaccine status: Up to date  Pneumococcal vaccine status: Completed during today's visit.  Covid-19 vaccine status: Completed vaccines  Qualifies for Shingles Vaccine? Yes   Zostavax completed No   Shingrix Completed?: No.    Education has been provided regarding the importance of this vaccine. Patient has been advised to call insurance company to determine out of pocket expense if they have not yet received this vaccine. Advised may also receive vaccine at local pharmacy or Health Dept. Verbalized acceptance and understanding.  Screening Tests Health Maintenance  Topic Date Due   FOOT EXAM  Never done   OPHTHALMOLOGY EXAM  Never done   TETANUS/TDAP  Never done   Zoster Vaccines- Shingrix (1 of 2) Never done   DEXA SCAN  Never done   COVID-19 Vaccine (4 - Booster for Pfizer series) 08/17/2020   HEMOGLOBIN A1C  03/02/2021   INFLUENZA VACCINE  03/16/2021   PNA vac Low Risk Adult  Completed   HPV VACCINES  Aged Out    Health Maintenance  Health Maintenance Due  Topic Date Due   FOOT EXAM  Never done   OPHTHALMOLOGY EXAM  Never done   TETANUS/TDAP  Never done   Zoster Vaccines- Shingrix (1 of 2) Never done   DEXA SCAN  Never done   COVID-19 Vaccine (4 - Booster for Pfizer series) 08/17/2020   HEMOGLOBIN A1C  03/02/2021   INFLUENZA VACCINE  03/16/2021    Colorectal cancer screening: No longer required.   Mammogram status: No longer required due to age.  Bone Density status: Ordered  patient declines . Pt provided with contact info and advised to call to schedule appt.  Lung Cancer Screening: (Low Dose CT Chest recommended if Age 12-80 years, 30 pack-year currently smoking OR have quit w/in 15years.) does not qualify.   Lung Cancer Screening Referral: n/a  Additional Screening:  Hepatitis C Screening: does not qualify;  Vision Screening: Recommended annual ophthalmology exams for early detection of glaucoma and other disorders of the eye. Is the patient up to date with their annual eye exam?  Yes  Who is the provider or what is the name of the office in which the patient attends annual eye exams? Dr.Scott If pt is not established with a provider, would they like to be referred to a provider to establish care? No .   Dental Screening: Recommended annual dental exams for proper oral hygiene  Community Resource Referral / Chronic Care Management: CRR required this visit?  No   CCM required this visit?  No      Plan:     I have personally reviewed and noted the following in the patient's chart:   Medical and social history Use of alcohol, tobacco or illicit drugs  Current medications and supplements including opioid prescriptions.  Functional ability and status Nutritional status Physical activity Advanced directives List of other physicians Hospitalizations, surgeries, and ER visits in previous  12 months Vitals Screenings to include cognitive, depression, and falls Referrals and appointments  In addition, I have reviewed and discussed with patient certain preventive protocols, quality metrics, and best practice recommendations. A written personalized care plan for preventive services as well as general preventive health recommendations were provided to patient.     Randel Pigg, LPN   11/22/9716   Nurse Notes: none

## 2021-03-17 NOTE — Patient Instructions (Signed)
Tara Fields , Thank you for taking time to come for your Medicare Wellness Visit. I appreciate your ongoing commitment to your health goals. Please review the following plan we discussed and let me know if I can assist you in the future.   Screening recommendations/referrals: Colonoscopy: no longer required  Mammogram: no longer required  Bone Density: declined  Recommended yearly ophthalmology/optometry visit for glaucoma screening and checkup Recommended yearly dental visit for hygiene and checkup  Vaccinations: Influenza vaccine: due in fall 2022  Pneumococcal vaccine: completed series  Tdap vaccine: due upon injury  Shingles vaccine: will consider     Advanced directives: none   Conditions/risks identified: none   Next appointment: none    Preventive Care 85 Years and Older, Female Preventive care refers to lifestyle choices and visits with your health care provider that can promote health and wellness. What does preventive care include? A yearly physical exam. This is also called an annual well check. Dental exams once or twice a year. Routine eye exams. Ask your health care provider how often you should have your eyes checked. Personal lifestyle choices, including: Daily care of your teeth and gums. Regular physical activity. Eating a healthy diet. Avoiding tobacco and drug use. Limiting alcohol use. Practicing safe sex. Taking low-dose aspirin every day. Taking vitamin and mineral supplements as recommended by your health care provider. What happens during an annual well check? The services and screenings done by your health care provider during your annual well check will depend on your age, overall health, lifestyle risk factors, and family history of disease. Counseling  Your health care provider may ask you questions about your: Alcohol use. Tobacco use. Drug use. Emotional well-being. Home and relationship well-being. Sexual activity. Eating  habits. History of falls. Memory and ability to understand (cognition). Work and work Statistician. Reproductive health. Screening  You may have the following tests or measurements: Height, weight, and BMI. Blood pressure. Lipid and cholesterol levels. These may be checked every 5 years, or more frequently if you are over 85 years old. Skin check. Lung cancer screening. You may have this screening every year starting at age 85 if you have a 30-pack-year history of smoking and currently smoke or have quit within the past 15 years. Fecal occult blood test (FOBT) of the stool. You may have this test every year starting at age 85. Flexible sigmoidoscopy or colonoscopy. You may have a sigmoidoscopy every 5 years or a colonoscopy every 10 years starting at age 85. Hepatitis C blood test. Hepatitis B blood test. Sexually transmitted disease (STD) testing. Diabetes screening. This is done by checking your blood sugar (glucose) after you have not eaten for a while (fasting). You may have this done every 1-3 years. Bone density scan. This is done to screen for osteoporosis. You may have this done starting at age 85. Mammogram. This may be done every 1-2 years. Talk to your health care provider about how often you should have regular mammograms. Talk with your health care provider about your test results, treatment options, and if necessary, the need for more tests. Vaccines  Your health care provider may recommend certain vaccines, such as: Influenza vaccine. This is recommended every year. Tetanus, diphtheria, and acellular pertussis (Tdap, Td) vaccine. You may need a Td booster every 10 years. Zoster vaccine. You may need this after age 85. Pneumococcal 13-valent conjugate (PCV13) vaccine. One dose is recommended after age 85. Pneumococcal polysaccharide (PPSV23) vaccine. One dose is recommended after age 85. Talk to  your health care provider about which screenings and vaccines you need and how  often you need them. This information is not intended to replace advice given to you by your health care provider. Make sure you discuss any questions you have with your health care provider. Document Released: 08/29/2015 Document Revised: 04/21/2016 Document Reviewed: 06/03/2015 Elsevier Interactive Patient Education  2017 [employer] Prevention in the Home Falls can cause injuries. They can happen to people of all ages. There are many things you can do to make your home safe and to help prevent falls. What can I do on the outside of my home? Regularly fix the edges of walkways and driveways and fix any cracks. Remove anything that might make you trip as you walk through a door, such as a raised step or threshold. Trim any bushes or trees on the path to your home. Use bright outdoor lighting. Clear any walking paths of anything that might make someone trip, such as rocks or tools. Regularly check to see if handrails are loose or broken. Make sure that both sides of any steps have handrails. Any raised decks and porches should have guardrails on the edges. Have any leaves, snow, or ice cleared regularly. Use sand or salt on walking paths during winter. Clean up any spills in your garage right away. This includes oil or grease spills. What can I do in the bathroom? Use night lights. Install grab bars by the toilet and in the tub and shower. Do not use towel bars as grab bars. Use non-skid mats or decals in the tub or shower. If you need to sit down in the shower, use a plastic, non-slip stool. Keep the floor dry. Clean up any water that spills on the floor as soon as it happens. Remove soap buildup in the tub or shower regularly. Attach bath mats securely with double-sided non-slip rug tape. Do not have throw rugs and other things on the floor that can make you trip. What can I do in the bedroom? Use night lights. Make sure that you have a light by your bed that is easy to  reach. Do not use any sheets or blankets that are too big for your bed. They should not hang down onto the floor. Have a firm chair that has side arms. You can use this for support while you get dressed. Do not have throw rugs and other things on the floor that can make you trip. What can I do in the kitchen? Clean up any spills right away. Avoid walking on wet floors. Keep items that you use a lot in easy-to-reach places. If you need to reach something above you, use a strong step stool that has a grab bar. Keep electrical cords out of the way. Do not use floor polish or wax that makes floors slippery. If you must use wax, use non-skid floor wax. Do not have throw rugs and other things on the floor that can make you trip. What can I do with my stairs? Do not leave any items on the stairs. Make sure that there are handrails on both sides of the stairs and use them. Fix handrails that are broken or loose. Make sure that handrails are as long as the stairways. Check any carpeting to make sure that it is firmly attached to the stairs. Fix any carpet that is loose or worn. Avoid having throw rugs at the top or bottom of the stairs. If you do have throw rugs, attach  them to the floor with carpet tape. Make sure that you have a light switch at the top of the stairs and the bottom of the stairs. If you do not have them, ask someone to add them for you. What else can I do to help prevent falls? Wear shoes that: Do not have high heels. Have rubber bottoms. Are comfortable and fit you well. Are closed at the toe. Do not wear sandals. If you use a stepladder: Make sure that it is fully opened. Do not climb a closed stepladder. Make sure that both sides of the stepladder are locked into place. Ask someone to hold it for you, if possible. Clearly mark and make sure that you can see: Any grab bars or handrails. First and last steps. Where the edge of each step is. Use tools that help you move  around (mobility aids) if they are needed. These include: Canes. Walkers. Scooters. Crutches. Turn on the lights when you go into a dark area. Replace any light bulbs as soon as they burn out. Set up your furniture so you have a clear path. Avoid moving your furniture around. If any of your floors are uneven, fix them. If there are any pets around you, be aware of where they are. Review your medicines with your doctor. Some medicines can make you feel dizzy. This can increase your chance of falling. Ask your doctor what other things that you can do to help prevent falls. This information is not intended to replace advice given to you by your health care provider. Make sure you discuss any questions you have with your health care provider. Document Released: 05/29/2009 Document Revised: 01/08/2016 Document Reviewed: 09/06/2014 Elsevier Interactive Patient Education  2017 [employer].

## 2021-03-23 NOTE — Progress Notes (Signed)
Cardiology Office Note    Date:  04/01/2021   ID:  Tara Fields, DOB 08-18-1927, MRN [id number]   PCP:  [name], NP   [place]  Cardiologist:  Lauree Chandler, MD   Advanced Practice Provider:  No care team member to display Electrophysiologist:  None   [phone]   Chief Complaint  Patient presents with   Follow-up     History of Present Illness:  Tara Fields is a 87 y.o. female with history of severe AS s/p TAVR at [place] 03/07/2019, She developed a pseudoaneurysm post TAVR and had thrombin injection. She has atrial fibrillation and is on Eliquis. She has carotid artery disease. She says that no stent was placed in her carotid artery. Cardiac cath March 2019 at [place] with 25% mid LAD stenosis, 50% stenosis second diagonal branch, 50% distal Circumflex stenosis, 75% ostial stenosis of a small Diagonal branch. Echo September 2020 at [place] with LVEF=45-50%. Moderate MR. Bioprosthetic aortic valve working well. Repeat echo at [place] July 2021 with no change, Trivial perivalvular leak noted. She was admitted to [facility] 12/19/20 with acute systolic CHF. Echo 12/20/20 with LVEF=25-30%. No ischemic evaluation was planned per discussion with Dr. Percival Spanish and the patient. She was diuresed with IV Lasix. Norvasc was stopped. She was seen in the ED 01/04/21 with c/o dyspnea. BNP was elevated at 1852 but overall unchanged over last few checks over the last few months. Troponin negative. Lasix increased to 20 mg po BID. COPD, CAD, GERD, persistent atrial fibrillation, DM, HTN, HLD, tobacco abuse and diverticulitis   Patient last saw Dr. Angelena Form 01/08/2021 and was doing well, no changes made.  Patient discharged 01/31/2021 after admission for CHF treated with IV Lasix.  She was transitioned to torsemide.  Patient comes in for f/u with her son. Swelling has gone down and stayed down. Liquid potassium has helped her stomach issues. Pharmacy filled lasix not torsemide so  that's what she's taking. Living at [facility]. Denies chest pain, palpitations dyspnea, edema. Walking around more at the facility. Limited by arthritis.   Past Medical History:  Diagnosis Date   Aortic stenosis    s/p TAVR 26 mm Edwards Sapien 3 THV July 2020   CAD (coronary artery disease)    COPD (chronic obstructive pulmonary disease) (HCC)    Diabetes mellitus (HCC)    Diverticulitis    GERD (gastroesophageal reflux disease)    HTN (hypertension)    Hyperlipidemia    Legally blind in left eye, as defined in [place]    Tobacco abuse     Past Surgical History:  Procedure Laterality Date   ABDOMINAL HYSTERECTOMY     APPENDECTOMY     BACK SURGERY     CATARACT EXTRACTION, BILATERAL     CHOLECYSTECTOMY      Current Medications: Current Meds  Medication Sig   acetaminophen (TYLENOL) 500 MG tablet Take 500 mg by mouth every 6 (six) hours as needed for mild pain (or headaches).   amLODipine (NORVASC) 5 MG tablet TAKE 1 TABLET BY MOUTH EVERY DAY   atorvastatin (LIPITOR) 40 MG tablet TAKE 1 TABLET BY MOUTH DAILY AT 6 PM.   Cholecalciferol (VITAMIN D-3 PO) Take 1,000 Units by mouth 2 (two) times daily.   Coenzyme Q10 (CO Q-10) 300 MG CAPS Take 300 mg by mouth in the morning.   [employer] ASPIRIN ADULT LOW DOSE 81 MG chewable tablet CHEW 1 TABLET (81 MG TOTAL) BY MOUTH DAILY.   ELIQUIS 5 MG TABS tablet  TAKE 1 TABLET BY MOUTH TWICE A DAY   fenofibrate 160 MG tablet TAKE 1 TABLET BY MOUTH EVERY DAY   ferrous gluconate (FERGON) 324 MG tablet TAKE 1 TABLET BY MOUTH DAILY WITH BREAKFAST   furosemide (LASIX) 20 MG tablet Take 20 mg by mouth 2 (two) times daily.   losartan (COZAAR) 100 MG tablet TAKE 1 TABLET BY MOUTH EVERY DAY   Magnesium 200 MG TABS Take 1 tablet by mouth daily at 6 (six) AM.   metoprolol tartrate (LOPRESSOR) 50 MG tablet TAKE 1 TABLET BY MOUTH TWICE A DAY   Multiple Vitamins-Minerals (CENTRUM SILVER 50+WOMEN) TABS Take 1 tablet by mouth daily with breakfast.   NASAL SPRAY  SALINE NA Place 1 spray into both nostrils as needed (for congestion).   Omega-3 Fatty Acids (FISH OIL) 1200 MG CAPS Take 1,200 mg by mouth in the morning.   potassium chloride 20 MEQ/15ML (10%) SOLN Take 7.5 mLs (10 mEq total) by mouth daily.   psyllium (METAMUCIL) 58.6 % powder Take 1 packet by mouth daily as needed (for constipation).   terazosin (HYTRIN) 10 MG capsule Take 1 capsule (10 mg total) by mouth daily at 12 noon.   traMADol (ULTRAM) 50 MG tablet TAKE 1 TABLET (50 MG TOTAL) BY MOUTH IN THE MORNING, AT NOON, AND AT BEDTIME.   [DISCONTINUED] torsemide (DEMADEX) 20 MG tablet Take 1 tablet (20 mg total) by mouth 2 (two) times daily.     Allergies:   Amlodipine besy-benazepril hcl, Hctz [hydrochlorothiazide], Other, Cyclobenzaprine, Diclofenac sodium, Hydralazine, Methylprednisolone, and Oxycodone   Social History   Socioeconomic History   Marital status: Widowed    Spouse name: Not on file   Number of children: 2   Years of education: Not on file   Highest education level: High school graduate  Occupational History   Occupation: Retired    Comment: homemaker  Tobacco Use   Smoking status: Former    Packs/day: 1.00    Years: 70.00    Pack years: 70.00    Types: Cigarettes    Quit date: 12/15/2019    Years since quitting: 1.2   Smokeless tobacco: Never   Tobacco comments:    Pt states quiting a year ago. 01/03/20 ARJ  Vaping Use   Vaping Use: Never used  Substance and Sexual Activity   Alcohol use: No    Alcohol/week: 0.0 standard drinks   Drug use: No   Sexual activity: Not Currently  Other Topics Concern   Not on file  Social History Narrative   Widowed- was married for 67 years    Has two sons       Social Determinants of Radio broadcast assistant Strain: Low Risk    Difficulty of Paying Living Expenses: Not hard at all  Food Insecurity: No Food Insecurity   Worried About Charity fundraiser in the Last Year: Never true   Arboriculturist in the Last Year:  Never true  Transportation Needs: No Transportation Needs   Lack of Transportation (Medical): No   Lack of Transportation (Non-Medical): No  Physical Activity: Insufficiently Active   Days of Exercise per Week: 5 days   Minutes of Exercise per Session: 10 min  Stress: No Stress Concern Present   Feeling of Stress : Not at all  Social Connections: Socially Isolated   Frequency of Communication with Friends and Family: Three times a week   Frequency of Social Gatherings with Friends and Family: Three times a week  Attends Religious Services: Never   Active Member of Clubs or Organizations: No   Attends Archivist Meetings: Never   Marital Status: Widowed     Family History:  The patient's  family history includes Cerebral palsy in her brother; Diabetes in her mother; Stroke in her brother.   ROS:   Please see the history of present illness.    ROS All other systems reviewed and are negative.   PHYSICAL EXAM:   VS:  BP (!) 114/52   Pulse 72   Ht 5' (1.524 m)   Wt 143 lb 12.8 oz (65.2 kg)   SpO2 97%   BMI 28.08 kg/m   Physical Exam  GEN: Well nourished, well developed, in no acute distress  Neck: no JVD, carotid bruits, or masses Cardiac:RRR; 2/6 systolic murmur LSB Respiratory:  clear to auscultation bilaterally, normal work of breathing GI: soft, nontender, nondistended, + BS Ext: without cyanosis, clubbing, or edema, Good distal pulses bilaterally Neuro:  Alert and Oriented x 3,  Psych: euthymic mood, full affect  Wt Readings from Last 3 Encounters:  04/01/21 143 lb 12.8 oz (65.2 kg)  02/18/21 139 lb (63 kg)  01/31/21 139 lb 12.8 oz (63.4 kg)      Studies/Labs Reviewed:   EKG:  EKG is not ordered today.     Recent Labs: 01/04/2021: Magnesium 1.8 01/29/2021: B Natriuretic Peptide 1,896.8; Hemoglobin 10.7; Platelets 345 02/18/2021: ALT 18 03/05/2021: BUN 48; Creatinine, Ser 1.19; Potassium 3.6; Sodium 140   Lipid Panel No results found for: CHOL, TRIG,  HDL, CHOLHDL, VLDL, LDLCALC, LDLDIRECT  Additional studies/ records that were reviewed today include:    2D echo 12/20/2020 IMPRESSIONS     1. There is prominent septal-lateral left ventricular dyssynchrony due to  LBBB. Left ventricular ejection fraction, by estimation, is 25 to 30%. The  left ventricle has severely decreased function. The left ventricle  demonstrates global hypokinesis. The  left ventricular internal cavity size was mildly dilated. Left ventricular  diastolic function could not be evaluated.   2. Right ventricular systolic function is normal. The right ventricular  size is normal. There is normal pulmonary artery systolic pressure.   3. Left atrial size was severely dilated.   4. The mitral valve is normal in structure. Mild mitral valve  regurgitation. No evidence of mitral stenosis.   5. Mild perivalvular leak. The aortic valve has been repaired/replaced.  Aortic valve regurgitation is mild. There is a 26 mm Sapien prosthetic  (TAVR) valve present in the aortic position. Procedure Date: 03/07/2019 at  [facility]. Aortic regurgitation  PHT measures 401 msec. Aortic valve mean gradient measures 12.2 mmHg.  Aortic valve Vmax measures 2.46 m/s. Aortic valve acceleration time  measures 76 msec.   6. The inferior vena cava is normal in size with greater than 50%  respiratory variability, suggesting right atrial pressure of 3 mmHg.   Comparison(s): A prior study was performed on 03/25/2020. Prior images  unable to be directly viewed, comparison made by report only. The left  ventricular function is significantly worse. 03/25/2020 study at [facility] reports LVEF 45-50%,  similar TAVR gradients and mild perivalvular leak. LBBB is not mentioned  on that report.   Risk Assessment/Calculations:    CHA2DS2-VASc Score = 7  This indicates a 11.2% annual risk of stroke. The patient's score is based upon: CHF History: Yes HTN History: Yes Diabetes History:  Yes Stroke History: No Vascular Disease History: Yes Age Score: 2 Gender Score: 1  ASSESSMENT:    1. Aortic valve disorder   2. Paroxysmal atrial fibrillation (HCC)   3. Chronic systolic CHF (congestive heart failure) ([place])   4. Coronary artery disease involving native coronary artery of native heart without angina pectoris   5. Bilateral carotid artery stenosis      PLAN:  In order of problems listed above:  Severe AS status post TAVR at [place] 03/07/2019-still on baby ASA asymptomatic  PAF on Eliquis-no bleeding problems.   Chronic systolic CHF LVEF 25 to 54% on echo 12/2020 patient has declined ischemic evaluation. Doing well on lasix 20 mg bid. Check bmet today and if ok continue this dose.  CAD mild to moderate on cath in 2019-no angina  Carotid artery disease followed at [place] with prior right carotid artery stenting   Shared Decision Making/Informed Consent        Medication Adjustments/Labs and Tests Ordered: Current medicines are reviewed at length with the patient today.  Concerns regarding medicines are outlined above.  Medication changes, Labs and Tests ordered today are listed in the Patient Instructions below. Patient Instructions  Medication Instructions:  Your physician recommends that you continue on your current medications as directed. Please refer to the Current Medication list given to you today.  *If you need a refill on your cardiac medications before your next appointment, please call your pharmacy*   Lab Work: Bmp- Today   If you have labs (blood work) drawn today and your tests are completely normal, you will receive your results only by: [place] (if you have MyChart) OR A paper copy in the mail If you have any lab test that is abnormal or we need to change your treatment, we will call you to review the results.   Testing/Procedures: None ordered    Follow-Up: Follow up as scheduled    Other Instructions None      Signed, Ermalinda Barrios, PA-C  04/01/2021 9:50 AM    [facility] [address], [place], [place]  [address] Phone: [phone]; Fax: [phone]

## 2021-03-30 ENCOUNTER — Telehealth: Payer: Self-pay | Admitting: Adult Health

## 2021-03-30 NOTE — Telephone Encounter (Signed)
[name] from [facility] call and stated she need a verbal order for  a skill nurse 1 x a wk for 4 wk's.

## 2021-03-30 NOTE — Telephone Encounter (Signed)
Okay for verbal orders? Please advise 

## 2021-03-31 NOTE — Telephone Encounter (Signed)
Left message to return phone call.

## 2021-04-01 ENCOUNTER — Encounter: Payer: Self-pay | Admitting: Physician Assistant

## 2021-04-01 ENCOUNTER — Ambulatory Visit (INDEPENDENT_AMBULATORY_CARE_PROVIDER_SITE_OTHER): Payer: Medicare Other | Admitting: Physician Assistant

## 2021-04-01 ENCOUNTER — Other Ambulatory Visit: Payer: Self-pay

## 2021-04-01 VITALS — BP 114/52 | HR 72 | Ht 60.0 in | Wt 143.8 lb

## 2021-04-01 DIAGNOSIS — I480 Paroxysmal atrial fibrillation: Secondary | ICD-10-CM | POA: Diagnosis not present

## 2021-04-01 DIAGNOSIS — I6523 Occlusion and stenosis of bilateral carotid arteries: Secondary | ICD-10-CM

## 2021-04-01 DIAGNOSIS — I359 Nonrheumatic aortic valve disorder, unspecified: Secondary | ICD-10-CM | POA: Diagnosis not present

## 2021-04-01 DIAGNOSIS — I5022 Chronic systolic (congestive) heart failure: Secondary | ICD-10-CM

## 2021-04-01 DIAGNOSIS — I2510 Atherosclerotic heart disease of native coronary artery without angina pectoris: Secondary | ICD-10-CM

## 2021-04-01 LAB — BASIC METABOLIC PANEL
BUN/Creatinine Ratio: 35 — ABNORMAL HIGH (ref 12–28)
BUN: 49 mg/dL — ABNORMAL HIGH (ref 10–36)
CO2: 26 mmol/L (ref 20–29)
Calcium: 9.4 mg/dL (ref 8.7–10.3)
Chloride: 100 mmol/L (ref 96–106)
Creatinine, Ser: 1.39 mg/dL — ABNORMAL HIGH (ref 0.57–1.00)
Glucose: 120 mg/dL — ABNORMAL HIGH (ref 65–99)
Potassium: 5 mmol/L (ref 3.5–5.2)
Sodium: 139 mmol/L (ref 134–144)
eGFR: 36 mL/min/{1.73_m2} — ABNORMAL LOW (ref >59)

## 2021-04-01 NOTE — Patient Instructions (Signed)
Medication Instructions:  Your physician recommends that you continue on your current medications as directed. Please refer to the Current Medication list given to you today.  *If you need a refill on your cardiac medications before your next appointment, please call your pharmacy*   Lab Work: Bmp- Today   If you have labs (blood work) drawn today and your tests are completely normal, you will receive your results only by: [place] (if you have MyChart) OR A paper copy in the mail If you have any lab test that is abnormal or we need to change your treatment, we will call you to review the results.   Testing/Procedures: None ordered    Follow-Up: Follow up as scheduled    Other Instructions None

## 2021-04-01 NOTE — Telephone Encounter (Signed)
Left message to return phone call.

## 2021-04-01 NOTE — Telephone Encounter (Signed)
Verbal orders given to Michelle. 

## 2021-04-02 ENCOUNTER — Telehealth: Payer: Self-pay | Admitting: Cardiovascular Disease

## 2021-04-02 DIAGNOSIS — E11638 Type 2 diabetes mellitus with other oral complications: Secondary | ICD-10-CM

## 2021-04-02 DIAGNOSIS — I5022 Chronic systolic (congestive) heart failure: Secondary | ICD-10-CM

## 2021-04-02 MED ORDER — POTASSIUM CHLORIDE 20 MEQ/15ML (10%) PO SOLN: 5.0000 meq | Freq: Every day | ORAL | 2 refills | Status: DC

## 2021-04-02 MED ORDER — FUROSEMIDE 20 MG PO TABS: 20.0000 mg | ORAL_TABLET | Freq: Every day | ORAL | Status: DC

## 2021-04-02 NOTE — Addendum Note (Signed)
Addended by: Devra Dopp E on: 04/02/2021 05:04 PM   Modules accepted: Orders

## 2021-04-02 NOTE — Telephone Encounter (Signed)
Pt's son is returning a call in regards to labwork.

## 2021-04-02 NOTE — Telephone Encounter (Addendum)
Pt's son aware of lab results SEE lab results ./cy

## 2021-04-08 ENCOUNTER — Other Ambulatory Visit: Payer: Self-pay

## 2021-04-13 ENCOUNTER — Other Ambulatory Visit: Payer: Self-pay

## 2021-04-13 ENCOUNTER — Inpatient Hospital Stay (HOSPITAL_BASED_OUTPATIENT_CLINIC_OR_DEPARTMENT_OTHER)
Admission: EM | Admit: 2021-04-13 | Discharge: 2021-04-16 | DRG: 378 | Disposition: A | Discharge status: Home Health | Payer: Medicare Other | Attending: Family Medicine | Admitting: Family Medicine

## 2021-04-13 ENCOUNTER — Encounter (HOSPITAL_BASED_OUTPATIENT_CLINIC_OR_DEPARTMENT_OTHER): Payer: Self-pay | Admitting: *Deleted

## 2021-04-13 DIAGNOSIS — I130 Hypertensive heart and chronic kidney disease with heart failure and stage 1 through stage 4 chronic kidney disease, or unspecified chronic kidney disease: Secondary | ICD-10-CM | POA: Diagnosis not present

## 2021-04-13 DIAGNOSIS — Z9841 Cataract extraction status, right eye: Secondary | ICD-10-CM

## 2021-04-13 DIAGNOSIS — Z87891 Personal history of nicotine dependence: Secondary | ICD-10-CM

## 2021-04-13 DIAGNOSIS — I447 Left bundle-branch block, unspecified: Secondary | ICD-10-CM | POA: Diagnosis present

## 2021-04-13 DIAGNOSIS — K648 Other hemorrhoids: Secondary | ICD-10-CM | POA: Diagnosis present

## 2021-04-13 DIAGNOSIS — I4819 Other persistent atrial fibrillation: Secondary | ICD-10-CM | POA: Diagnosis not present

## 2021-04-13 DIAGNOSIS — Z885 Allergy status to narcotic agent status: Secondary | ICD-10-CM | POA: Diagnosis not present

## 2021-04-13 DIAGNOSIS — Z952 Presence of prosthetic heart valve: Secondary | ICD-10-CM | POA: Diagnosis not present

## 2021-04-13 DIAGNOSIS — L89152 Pressure ulcer of sacral region, stage 2: Secondary | ICD-10-CM | POA: Diagnosis not present

## 2021-04-13 DIAGNOSIS — I10 Essential (primary) hypertension: Secondary | ICD-10-CM | POA: Diagnosis present

## 2021-04-13 DIAGNOSIS — Z9842 Cataract extraction status, left eye: Secondary | ICD-10-CM

## 2021-04-13 DIAGNOSIS — Z833 Family history of diabetes mellitus: Secondary | ICD-10-CM

## 2021-04-13 DIAGNOSIS — J449 Chronic obstructive pulmonary disease, unspecified: Secondary | ICD-10-CM | POA: Diagnosis not present

## 2021-04-13 DIAGNOSIS — N1832 Chronic kidney disease, stage 3b: Secondary | ICD-10-CM | POA: Diagnosis not present

## 2021-04-13 DIAGNOSIS — I2510 Atherosclerotic heart disease of native coronary artery without angina pectoris: Secondary | ICD-10-CM | POA: Diagnosis present

## 2021-04-13 DIAGNOSIS — Z888 Allergy status to other drugs, medicaments and biological substances status: Secondary | ICD-10-CM

## 2021-04-13 DIAGNOSIS — I5022 Chronic systolic (congestive) heart failure: Secondary | ICD-10-CM | POA: Diagnosis present

## 2021-04-13 DIAGNOSIS — D62 Acute posthemorrhagic anemia: Secondary | ICD-10-CM | POA: Diagnosis present

## 2021-04-13 DIAGNOSIS — D649 Anemia, unspecified: Secondary | ICD-10-CM | POA: Diagnosis not present

## 2021-04-13 DIAGNOSIS — Z85118 Personal history of other malignant neoplasm of bronchus and lung: Secondary | ICD-10-CM

## 2021-04-13 DIAGNOSIS — Z79899 Other long term (current) drug therapy: Secondary | ICD-10-CM

## 2021-04-13 DIAGNOSIS — K319 Disease of stomach and duodenum, unspecified: Secondary | ICD-10-CM | POA: Diagnosis present

## 2021-04-13 DIAGNOSIS — E1122 Type 2 diabetes mellitus with diabetic chronic kidney disease: Secondary | ICD-10-CM | POA: Diagnosis not present

## 2021-04-13 DIAGNOSIS — K219 Gastro-esophageal reflux disease without esophagitis: Secondary | ICD-10-CM | POA: Diagnosis present

## 2021-04-13 DIAGNOSIS — K5731 Diverticulosis of large intestine without perforation or abscess with bleeding: Principal | ICD-10-CM | POA: Diagnosis present

## 2021-04-13 DIAGNOSIS — E876 Hypokalemia: Secondary | ICD-10-CM | POA: Diagnosis not present

## 2021-04-13 DIAGNOSIS — Z9049 Acquired absence of other specified parts of digestive tract: Secondary | ICD-10-CM | POA: Diagnosis not present

## 2021-04-13 DIAGNOSIS — R9431 Abnormal electrocardiogram [ECG] [EKG]: Secondary | ICD-10-CM | POA: Diagnosis present

## 2021-04-13 DIAGNOSIS — H548 Legal blindness, as defined in USA: Secondary | ICD-10-CM | POA: Diagnosis present

## 2021-04-13 DIAGNOSIS — E785 Hyperlipidemia, unspecified: Secondary | ICD-10-CM | POA: Diagnosis present

## 2021-04-13 DIAGNOSIS — J418 Mixed simple and mucopurulent chronic bronchitis: Secondary | ICD-10-CM | POA: Diagnosis not present

## 2021-04-13 DIAGNOSIS — Z90710 Acquired absence of both cervix and uterus: Secondary | ICD-10-CM

## 2021-04-13 DIAGNOSIS — K922 Gastrointestinal hemorrhage, unspecified: Secondary | ICD-10-CM

## 2021-04-13 DIAGNOSIS — M1990 Unspecified osteoarthritis, unspecified site: Secondary | ICD-10-CM | POA: Diagnosis present

## 2021-04-13 DIAGNOSIS — D509 Iron deficiency anemia, unspecified: Secondary | ICD-10-CM | POA: Diagnosis not present

## 2021-04-13 DIAGNOSIS — N179 Acute kidney failure, unspecified: Secondary | ICD-10-CM | POA: Diagnosis present

## 2021-04-13 DIAGNOSIS — N1831 Chronic kidney disease, stage 3a: Secondary | ICD-10-CM | POA: Diagnosis not present

## 2021-04-13 DIAGNOSIS — Z823 Family history of stroke: Secondary | ICD-10-CM

## 2021-04-13 DIAGNOSIS — K644 Residual hemorrhoidal skin tags: Secondary | ICD-10-CM | POA: Diagnosis not present

## 2021-04-13 DIAGNOSIS — N183 Chronic kidney disease, stage 3 unspecified: Secondary | ICD-10-CM

## 2021-04-13 DIAGNOSIS — R195 Other fecal abnormalities: Secondary | ICD-10-CM | POA: Diagnosis not present

## 2021-04-13 DIAGNOSIS — Z7901 Long term (current) use of anticoagulants: Secondary | ICD-10-CM

## 2021-04-13 DIAGNOSIS — E538 Deficiency of other specified B group vitamins: Secondary | ICD-10-CM | POA: Diagnosis present

## 2021-04-13 DIAGNOSIS — Z7982 Long term (current) use of aspirin: Secondary | ICD-10-CM

## 2021-04-13 HISTORY — DX: Gastrointestinal hemorrhage, unspecified: K92.2

## 2021-04-13 LAB — OCCULT BLOOD X 1 CARD TO LAB, STOOL: Fecal Occult Bld: POSITIVE — AB

## 2021-04-13 LAB — CBC
HCT: 23.5 % — ABNORMAL LOW (ref 36.0–46.0)
Hemoglobin: 7.2 g/dL — ABNORMAL LOW (ref 12.0–15.0)
MCH: 30.1 pg (ref 26.0–34.0)
MCHC: 30.6 g/dL (ref 30.0–36.0)
MCV: 98.3 fL (ref 80.0–100.0)
Platelets: 251 10*3/uL (ref 150–400)
RBC: 2.39 MIL/uL — ABNORMAL LOW (ref 3.87–5.11)
RDW: 21.4 % — ABNORMAL HIGH (ref 11.5–15.5)
WBC: 4.2 10*3/uL (ref 4.0–10.5)
nRBC: 0 % (ref 0.0–0.2)

## 2021-04-13 LAB — GLUCOSE, CAPILLARY: Glucose-Capillary: 147 mg/dL — ABNORMAL HIGH (ref 70–99)

## 2021-04-13 LAB — BASIC METABOLIC PANEL
Anion gap: 10 (ref 5–15)
BUN: 37 mg/dL — ABNORMAL HIGH (ref 8–23)
CO2: 23 mmol/L (ref 22–32)
Calcium: 8.5 mg/dL — ABNORMAL LOW (ref 8.9–10.3)
Chloride: 105 mmol/L (ref 98–111)
Creatinine, Ser: 1.37 mg/dL — ABNORMAL HIGH (ref 0.44–1.00)
GFR, Estimated: 36 mL/min — ABNORMAL LOW (ref >60)
Glucose, Bld: 126 mg/dL — ABNORMAL HIGH (ref 70–99)
Potassium: 4.1 mmol/L (ref 3.5–5.1)
Sodium: 138 mmol/L (ref 135–145)

## 2021-04-13 LAB — VITAMIN B12: Vitamin B-12: 90 pg/mL — ABNORMAL LOW (ref 180–914)

## 2021-04-13 LAB — IRON AND TIBC
Iron: 67 ug/dL (ref 28–170)
Saturation Ratios: 13 % (ref 10.4–31.8)
TIBC: 525 ug/dL — ABNORMAL HIGH (ref 250–450)
UIBC: 458 ug/dL

## 2021-04-13 LAB — FOLATE: Folate: 7.5 ng/mL (ref >5.9)

## 2021-04-13 LAB — PREPARE RBC (CROSSMATCH)

## 2021-04-13 LAB — RETICULOCYTES
Immature Retic Fract: 33.8 % — ABNORMAL HIGH (ref 2.3–15.9)
RBC.: 2.38 MIL/uL — ABNORMAL LOW (ref 3.87–5.11)
Retic Count, Absolute: 182.5 10*3/uL (ref 19.0–186.0)
Retic Ct Pct: 7.8 % — ABNORMAL HIGH (ref 0.4–3.1)

## 2021-04-13 LAB — ABO/RH: ABO/RH(D): O POS

## 2021-04-13 LAB — FERRITIN: Ferritin: 82 ng/mL (ref 11–307)

## 2021-04-13 MED ORDER — POTASSIUM CHLORIDE NICU/PED ORAL SYRINGE 2 MEQ/ML
7.6000 meq | Freq: Every day | ORAL | Status: DC
  Filled 2021-04-13: qty 3.8

## 2021-04-13 MED ORDER — FUROSEMIDE 10 MG/ML IJ SOLN
20.0000 mg | Freq: Once | INTRAMUSCULAR | Status: AC
  Administered 2021-04-13: 20 mg via INTRAVENOUS
  Filled 2021-04-13: qty 2

## 2021-04-13 MED ORDER — FENOFIBRATE 160 MG PO TABS
160.0000 mg | ORAL_TABLET | Freq: Every morning | ORAL | Status: DC
  Administered 2021-04-14 – 2021-04-16 (×3): 160 mg via ORAL
  Filled 2021-04-13 (×3): qty 1

## 2021-04-13 MED ORDER — SODIUM CHLORIDE 0.9% IV SOLUTION: Freq: Once | INTRAVENOUS | Status: AC

## 2021-04-13 MED ORDER — METOPROLOL TARTRATE 50 MG PO TABS
50.0000 mg | ORAL_TABLET | Freq: Two times a day (BID) | ORAL | Status: DC
  Administered 2021-04-14 – 2021-04-16 (×3): 50 mg via ORAL
  Filled 2021-04-13 (×5): qty 1

## 2021-04-13 MED ORDER — DIPHENHYDRAMINE HCL 25 MG PO CAPS
25.0000 mg | ORAL_CAPSULE | Freq: Once | ORAL | Status: AC
  Administered 2021-04-13: 25 mg via ORAL
  Filled 2021-04-13: qty 1

## 2021-04-13 MED ORDER — LACTATED RINGERS IV SOLN: INTRAVENOUS | Status: DC

## 2021-04-13 MED ORDER — TERAZOSIN HCL 5 MG PO CAPS
10.0000 mg | ORAL_CAPSULE | Freq: Every evening | ORAL | Status: DC
  Administered 2021-04-13 – 2021-04-15 (×3): 10 mg via ORAL
  Filled 2021-04-13 (×4): qty 2

## 2021-04-13 MED ORDER — ACETAMINOPHEN 325 MG PO TABS: 650.0000 mg | ORAL_TABLET | Freq: Four times a day (QID) | ORAL | Status: DC | PRN

## 2021-04-13 MED ORDER — ACETAMINOPHEN 650 MG RE SUPP: 650.0000 mg | Freq: Four times a day (QID) | RECTAL | Status: DC | PRN

## 2021-04-13 MED ORDER — TRAMADOL HCL 50 MG PO TABS
50.0000 mg | ORAL_TABLET | Freq: Two times a day (BID) | ORAL | Status: DC | PRN
  Administered 2021-04-14: 50 mg via ORAL
  Filled 2021-04-13 (×2): qty 1

## 2021-04-13 MED ORDER — ATORVASTATIN CALCIUM 40 MG PO TABS
40.0000 mg | ORAL_TABLET | Freq: Every evening | ORAL | Status: DC
  Administered 2021-04-13 – 2021-04-15 (×3): 40 mg via ORAL
  Filled 2021-04-13 (×3): qty 1

## 2021-04-13 MED ORDER — FUROSEMIDE 20 MG PO TABS
20.0000 mg | ORAL_TABLET | Freq: Every day | ORAL | Status: DC
  Administered 2021-04-14 – 2021-04-16 (×3): 20 mg via ORAL
  Filled 2021-04-13 (×3): qty 1

## 2021-04-13 MED ORDER — NON FORMULARY: 3.8000 mL | Freq: Every day | Status: DC

## 2021-04-13 MED ORDER — ACETAMINOPHEN 325 MG PO TABS
650.0000 mg | ORAL_TABLET | Freq: Once | ORAL | Status: AC
  Administered 2021-04-13: 650 mg via ORAL
  Filled 2021-04-13: qty 2

## 2021-04-13 NOTE — ED Notes (Signed)
RT Note: 20G IV established Right AC, flushed/blood return noted, RN @ bedside.

## 2021-04-13 NOTE — ED Triage Notes (Signed)
Since Thursday, spells of weakness, nausea, body aches. Swelling noted in left ankle which is not new.

## 2021-04-13 NOTE — ED Provider Notes (Signed)
[facility] EMERGENCY DEPT Provider Note   CSN: [id number] Arrival date & time: 04/13/21  1113     History Chief Complaint  Patient presents with   Nausea   Generalized Body Aches   Weakness    Tara Fields is a 87 y.o. female.  Presented to the emergency room with concern for generalized weakness, fatigue, body aches.  Ongoing for the past few days to 1 week.  Son thinks that she appears more pale than normal.  Takes iron supplement.  Has dark stools frequently.  No bright red blood in her stool.  No abdominal pain.  No fevers or chills.  Feels short of breath with exertion.  No shortness of breath at rest.  No associated chest pain.  HPI     Past Medical History:  Diagnosis Date   Aortic stenosis    s/p TAVR 26 mm Edwards Sapien 3 THV July 2020   CAD (coronary artery disease)    COPD (chronic obstructive pulmonary disease) (HCC)    Diabetes mellitus (HCC)    Diverticulitis    GERD (gastroesophageal reflux disease)    HTN (hypertension)    Hyperlipidemia    Legally blind in left eye, as defined in [place]    Tobacco abuse     Patient Active Problem List   Diagnosis Date Noted   Acute on chronic systolic CHF (congestive heart failure) ([place]) 01/29/2021   Cognitive deficits    Pressure injury of skin 12/19/2020   Acute exacerbation of CHF (congestive heart failure) ([place]) 12/18/2020   COPD (chronic obstructive pulmonary disease) ([place]) 09/02/2020   Dyslipidemia 09/02/2020   Acute respiratory failure with hypoxia ([place]) 09/01/2020   Pulmonary nodule 1 cm or greater in diameter 01/03/2020   S/P TAVR (transcatheter aortic valve replacement) 05/03/2019   Primary osteoarthritis involving multiple joints 05/03/2019   Other persistent atrial fibrillation ([place]) 05/03/2019   Bilateral carotid artery stenosis 12/01/2017   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/13/2014   Aortic valve disorder 05/03/2014   Chronic obstructive bronchitis without exacerbation ([place])  05/03/2014   Derangement of left knee 05/03/2014   Esophageal reflux 05/03/2014   Essential hypertension 05/03/2014   Pure hypercholesterolemia 05/03/2014   Type II diabetes mellitus ([employer]) 05/03/2014   Vitamin D deficiency 05/03/2014    Past Surgical History:  Procedure Laterality Date   ABDOMINAL HYSTERECTOMY     APPENDECTOMY     BACK SURGERY     CATARACT EXTRACTION, BILATERAL     CHOLECYSTECTOMY       OB History     Gravida  2   Para  2   Term      Preterm      AB      Living         SAB      IAB      Ectopic      Multiple      Live Births              Family History  Problem Relation Age of Onset   Diabetes Mother    Stroke Brother    Cerebral palsy Brother    Coronary artery disease Neg Hx     Social History   Tobacco Use   Smoking status: Former    Packs/day: 1.00    Years: 70.00    Pack years: 70.00    Types: Cigarettes    Quit date: 12/15/2019    Years since quitting: 1.3   Smokeless tobacco: Never  Tobacco comments:    Pt states quiting a year ago. 01/03/20 [facility]  Vaping Use   Vaping Use: Never used  Substance Use Topics   Alcohol use: No    Alcohol/week: 0.0 standard drinks   Drug use: No    Home Medications Prior to Admission medications   Medication Sig Start Date End Date Taking? Authorizing Provider  acetaminophen (TYLENOL) 500 MG tablet Take 500 mg by mouth every 6 (six) hours as needed for mild pain (or headaches).   Yes [provider]  amLODipine (NORVASC) 5 MG tablet TAKE 1 TABLET BY MOUTH EVERY DAY 03/10/21  Yes Nafziger, Tommi Rumps, NP  atorvastatin (LIPITOR) 40 MG tablet TAKE 1 TABLET BY MOUTH DAILY AT 6 PM. 03/10/21  Yes Nafziger, Tommi Rumps, NP  Cholecalciferol (VITAMIN D-3 PO) Take 1,000 Units by mouth 2 (two) times daily.   Yes [provider]  Coenzyme Q10 (CO Q-10) 300 MG CAPS Take 300 mg by mouth in the morning.   Yes [provider]  [facility] ASPIRIN ADULT LOW DOSE 81 MG chewable tablet CHEW 1  TABLET (81 MG TOTAL) BY MOUTH DAILY. 03/10/21  Yes Nafziger, Tommi Rumps, NP  ELIQUIS 5 MG TABS tablet TAKE 1 TABLET BY MOUTH TWICE A DAY 03/10/21  Yes Nafziger, Tommi Rumps, NP  fenofibrate 160 MG tablet TAKE 1 TABLET BY MOUTH EVERY DAY 03/10/21  Yes Nafziger, Tommi Rumps, NP  ferrous gluconate (FERGON) 324 MG tablet TAKE 1 TABLET BY MOUTH DAILY WITH BREAKFAST 03/10/21  Yes Nafziger, Tommi Rumps, NP  furosemide (LASIX) 20 MG tablet Take 1 tablet (20 mg total) by mouth daily. 04/02/21  Yes [name], PA-C  losartan (COZAAR) 100 MG tablet TAKE 1 TABLET BY MOUTH EVERY DAY 01/14/21  Yes Burnell Blanks, MD  Magnesium 200 MG TABS Take 1 tablet by mouth daily at 6 (six) AM.   Yes [provider]  metoprolol tartrate (LOPRESSOR) 50 MG tablet TAKE 1 TABLET BY MOUTH TWICE A DAY 03/10/21  Yes Nafziger, Tommi Rumps, NP  Multiple Vitamins-Minerals (CENTRUM SILVER 50+WOMEN) TABS Take 1 tablet by mouth daily with breakfast.   Yes [provider]  potassium chloride 20 MEQ/15ML (10%) SOLN Take 3.8 mLs (5.0667 mEq total) by mouth daily. 04/02/21 07/01/21 Yes [name], PA-C  psyllium (METAMUCIL) 58.6 % powder Take 1 packet by mouth daily as needed (for constipation).   Yes [provider]  terazosin (HYTRIN) 10 MG capsule Take 1 capsule (10 mg total) by mouth daily at 12 noon. 12/16/20  Yes Nafziger, Tommi Rumps, NP  traMADol (ULTRAM) 50 MG tablet TAKE 1 TABLET (50 MG TOTAL) BY MOUTH IN THE MORNING, AT NOON, AND AT BEDTIME. 03/10/21  Yes Nafziger, Tommi Rumps, NP  NASAL SPRAY SALINE NA Place 1 spray into both nostrils as needed (for congestion).    [provider]  Omega-3 Fatty Acids (FISH OIL) 1200 MG CAPS Take 1,200 mg by mouth in the morning.    [provider]  traZODone (DESYREL) 50 MG tablet Take 0.5 tablets (25 mg total) by mouth at bedtime. 12/21/20 01/30/21  Elodia Florence., MD    Allergies    Amlodipine besy-benazepril hcl, Hctz [hydrochlorothiazide], Other, Cyclobenzaprine, Diclofenac  sodium, Hydralazine, Methylprednisolone, and Oxycodone  Review of Systems   Review of Systems  Constitutional:  Positive for fatigue. Negative for chills and fever.  HENT:  Negative for ear pain and sore throat.   Eyes:  Negative for pain and visual disturbance.  Respiratory:  Negative for cough and shortness of breath.  Cardiovascular:  Negative for chest pain and palpitations.  Gastrointestinal:  Positive for blood in stool. Negative for abdominal pain and vomiting.  Genitourinary:  Negative for dysuria and hematuria.  Musculoskeletal:  Negative for arthralgias and back pain.  Skin:  Negative for color change and rash.  Neurological:  Negative for seizures and syncope.  All other systems reviewed and are negative.  Physical Exam Updated Vital Signs BP 139/62 (BP Location: Left Arm)   Pulse 78   Temp 97.9 F (36.6 C) (Oral)   Resp 16   Wt 67.1 kg   SpO2 95%   BMI 28.90 kg/m   Physical Exam Vitals and nursing note reviewed.  Constitutional:      General: She is not in acute distress.    Appearance: She is well-developed.  HENT:     Head: Normocephalic and atraumatic.  Eyes:     Comments: Conjunctiva pale  Cardiovascular:     Rate and Rhythm: Normal rate and regular rhythm.     Heart sounds: No murmur heard. Pulmonary:     Effort: Pulmonary effort is normal. No respiratory distress.     Breath sounds: Normal breath sounds.  Abdominal:     Palpations: Abdomen is soft.     Tenderness: There is no abdominal tenderness.  Genitourinary:    Comments: Caitlin RN chaperone Normal-appearing rectum, stool dark brown, Hemoccult positive Musculoskeletal:        General: No deformity or signs of injury.     Cervical back: Neck supple.  Skin:    General: Skin is warm and dry.     Coloration: Skin is pale.  Neurological:     General: No focal deficit present.     Mental Status: She is alert.  Psychiatric:        Mood and Affect: Mood normal.    ED Results / Procedures /  Treatments   Labs (all labs ordered are listed, but only abnormal results are displayed) Labs Reviewed  BASIC METABOLIC PANEL - Abnormal; Notable for the following components:      Result Value   Glucose, Bld 126 (*)    BUN 37 (*)    Creatinine, Ser 1.37 (*)    Calcium 8.5 (*)    GFR, Estimated 36 (*)    All other components within normal limits  CBC - Abnormal; Notable for the following components:   RBC 2.39 (*)    Hemoglobin 7.2 (*)    HCT 23.5 (*)    RDW 21.4 (*)    All other components within normal limits  OCCULT BLOOD X 1 CARD TO LAB, STOOL - Abnormal; Notable for the following components:   Fecal Occult Bld POSITIVE (*)    All other components within normal limits  RETICULOCYTES - Abnormal; Notable for the following components:   Retic Ct Pct 7.8 (*)    RBC. 2.38 (*)    Immature Retic Fract 33.8 (*)    All other components within normal limits  URINALYSIS, ROUTINE W REFLEX MICROSCOPIC  VITAMIN B12  FOLATE  IRON AND TIBC  FERRITIN  CBG MONITORING, ED    EKG EKG Interpretation  Date/Time:  Monday April 13 2021 11:38:56 EDT Ventricular Rate:  74 PR Interval:    QRS Duration: 174 QT Interval:  443 QTC Calculation: 492 R Axis:   14 Text Interpretation: Atrial fibrillation Left bundle branch block Confirmed by Madalyn Rob [phone]) on 04/13/2021 1:19:24 PM  Radiology No results found.  Procedures .Critical Care  Date/Time: 04/13/2021 2:35 PM  Performed by: Lucrezia Starch, MD Authorized by: Lucrezia Starch, MD   Critical care provider statement:    Critical care time (minutes):  35   Critical care was time spent personally by me on the following activities:  Discussions with consultants, evaluation of patient's response to treatment, examination of patient, ordering and performing treatments and interventions, ordering and review of laboratory studies, ordering and review of radiographic studies, pulse oximetry, re-evaluation of patient's condition,  obtaining history from patient or surrogate and review of old charts   Medications Ordered in ED Medications - No data to display  ED Course  I have reviewed the triage vital signs and the nursing notes.  Pertinent labs & imaging results that were available during my care of the patient were reviewed by me and considered in my medical decision making (see chart for details).    MDM Rules/Calculators/A&P                           87 year old lady presents to ER with concern for generalized weakness, fatigue, body aches.  On exam patient noted to be pale but vital stable in no acute distress.  Blood work concerning for significant drop in anemia.  Down to 7.2 today, 10.7 a couple months ago.  On Eliquis for A. fib history.  Placed consult to [place] Velora Heckler PCP). Lyndee Leo called back, agrees with hospitalist admission.  She will add to their list to see as consult whenever patient is admitted.  Placed [facility] to [facility] for admit.  Final Clinical Impression(s) / ED Diagnoses Final diagnoses:  Gastrointestinal hemorrhage, unspecified gastrointestinal hemorrhage type  Anemia, unspecified type    Rx / DC Orders ED Discharge Orders     None        Lucrezia Starch, MD 04/13/21 1435

## 2021-04-13 NOTE — H&P (Signed)
History and Physical    Tara Fields TML:[id number] DOB: 30-Dec-1927 DOA: 04/13/2021  PCP: [name], NP   Patient coming from: Home  Chief Complaint: Fatigue, dizziness and lightheadedness when stands up.  Shortness of breath with exertion  HPI: Tara Fields is a 87 y.o. female with medical history significant for HTN, COPD, CAD, HLD, s/p TAVR who presents for evaluation of generalized weakness and fatigue with shortness of breath with exertion.  She is been having worsening symptoms over the last week.  She has been getting lightheaded and dizzy when she stands up but has not had any syncopal episodes.  She has shortness of breath with walking short distance which is new.  Her son states that she looks more pale than normal.  She always has dark stools because she takes iron supplements.  She has not noticed any bright red blood in stools.  She denies having any abdominal pain.  She denies urinary symptoms of frequency or dysuria.  She has been taking her medications as prescribed including her Eliquis.  She has atrial fibrillation and occasionally feels palpitations but has not had any chest pain or pressure.  She denies any abdominal injury or trauma.  ED Course: Tara Fields has been hemodynamically stable.  She was found to have a 3 g drop in her hemoglobin to 7.3.  2 months ago was 10.7.  Is Hemoccult positive in the emergency room.  Has chronic kidney disease which is stable.  Electrolytes are normal.  No signs of infection.  With her having symptomatic anemia and she is admitted to the hospitalist service for further evaluation.  [facility] gastroenterology has been consulted and will see in the morning  Review of Systems:  General: Reports fatigue and generalized weakness, Lightheaded when stands up. Denies fever, chills, weight loss, night sweats. Denies change in appetite HENT: Denies head trauma, headache, denies change in hearing, tinnitus.  Denies nasal congestion or  bleeding.  Denies sore throat, sores in mouth.  Denies difficulty swallowing Eyes: Denies blurry vision, pain in eye, drainage.  Denies discoloration of eyes. Neck: Denies pain.  Denies swelling.  Denies pain with movement. Cardiovascular: Denies chest pain, palpitations. Denies edema.  Denies orthopnea Respiratory: Reports shortness of breath with exertion. Denies cough.  Denies wheezing.  Denies sputum production Gastrointestinal: Denies abdominal pain, swelling.  Denies nausea, vomiting, diarrhea.  Denies hematemesis. Musculoskeletal: Denies limitation of movement.  Denies deformity or swelling.  Denies pain.  Denies arthralgias or myalgias. Genitourinary: Denies pelvic pain.  Denies urinary frequency or hesitancy.  Denies dysuria.  Skin: Denies rash.  Denies petechiae, purpura, ecchymosis. Neurological: Denies syncope.  Denies seizure activity.  Denies paresthesia.  Denies slurred speech, drooping face.  Denies visual change. Psychiatric: Denies depression, anxiety.  Denies hallucinations.  Past Medical History:  Diagnosis Date   Aortic stenosis    s/p TAVR 26 mm Edwards Sapien 3 THV July 2020   CAD (coronary artery disease)    COPD (chronic obstructive pulmonary disease) (HCC)    Diabetes mellitus (HCC)    Diverticulitis    GERD (gastroesophageal reflux disease)    HTN (hypertension)    Hyperlipidemia    Legally blind in left eye, as defined in [place]    Tobacco abuse     Past Surgical History:  Procedure Laterality Date   ABDOMINAL HYSTERECTOMY     APPENDECTOMY     BACK SURGERY     CATARACT EXTRACTION, BILATERAL     CHOLECYSTECTOMY      Social History  reports that she quit smoking about 15 months ago. Her smoking use included cigarettes. She has a 70.00 pack-year smoking history. She has never used smokeless tobacco. She reports that she does not drink alcohol and does not use drugs.  Allergies  Allergen Reactions   Amlodipine Besy-Benazepril Hcl Cough   Hctz  [Hydrochlorothiazide] Other (See Comments)    Per family, every diuretic the patient has been on flushes out her out and ultimately results in CRAMPING/JAUNDICE (added potassium might help)   Other Other (See Comments)    History of diverticulitis- CANNOT TOLERATE NUTS, CORN, AND ANY FOODS NOT EASILY DIGESTED- the patient AVOIDS these   Cyclobenzaprine Other (See Comments)    Confusion- does not tolerate well    Diclofenac Sodium Diarrhea   Hydralazine Other (See Comments)    Per family, every diuretic the patient has been on flushes out her out and ultimately results in CRAMPING/JAUNDICE (added potassium might help)   Methylprednisolone Other (See Comments)    Medrol/CORTICOSTEROIDS = A LOT OF ENERGY   Oxycodone Other (See Comments)    Caused patient to "act crazy"    Family History  Problem Relation Age of Onset   Diabetes Mother    Stroke Brother    Cerebral palsy Brother    Coronary artery disease Neg Hx      Prior to Admission medications   Medication Sig Start Date End Date Taking? Authorizing Provider  acetaminophen (TYLENOL) 500 MG tablet Take 500 mg by mouth 3 (three) times daily as needed for headache (pain). Take with tramadol   Yes [provider]  atorvastatin (LIPITOR) 40 MG tablet TAKE 1 TABLET BY MOUTH DAILY AT 6 PM. Patient taking differently: Take 40 mg by mouth every evening. 03/10/21  Yes Nafziger, Tommi Rumps, NP  cholecalciferol (VITAMIN D3) 25 MCG (1000 UNIT) tablet Take 1,000 Units by mouth 2 (two) times daily.   Yes [provider]  Coenzyme Q10 (CO Q-10) 300 MG CAPS Take 300 mg by mouth in the morning.   Yes [provider]  [facility]S ASPIRIN ADULT LOW DOSE 81 MG chewable tablet CHEW 1 TABLET (81 MG TOTAL) BY MOUTH DAILY. Patient taking differently: Chew 81 mg by mouth every morning. 03/10/21  Yes Nafziger, Tommi Rumps, NP  ELIQUIS 5 MG TABS tablet TAKE 1 TABLET BY MOUTH TWICE A DAY Patient taking differently: Take 5 mg by mouth 2 (two) times daily.  03/10/21  Yes Nafziger, Tommi Rumps, NP  fenofibrate 160 MG tablet TAKE 1 TABLET BY MOUTH EVERY DAY Patient taking differently: Take 160 mg by mouth every morning. 03/10/21  Yes Nafziger, Tommi Rumps, NP  ferrous gluconate (FERGON) 324 MG tablet TAKE 1 TABLET BY MOUTH DAILY WITH BREAKFAST Patient taking differently: Take 324 mg by mouth every morning. 03/10/21  Yes Nafziger, Tommi Rumps, NP  furosemide (LASIX) 20 MG tablet Take 1 tablet (20 mg total) by mouth daily. Patient taking differently: Take 20 mg by mouth See admin instructions. Take one tablet (20 mg) by mouth every morning, take another tablet (20 mg) in the afternoon as needed for weight gain of 2-3 lbs/day 04/02/21  Yes [name], PA-C  Lidocaine-Menthol, Spray, 4-1 % LIQD Apply 1 spray topically daily as needed (back/knee pain/restless legs).   Yes [provider]  losartan (COZAAR) 100 MG tablet TAKE 1 TABLET BY MOUTH EVERY DAY Patient taking differently: Take 100 mg by mouth every morning. 01/14/21  Yes Burnell Blanks, MD  Magnesium 200 MG TABS Take 200 mg by mouth every morning.  Yes [provider]  metoprolol tartrate (LOPRESSOR) 50 MG tablet TAKE 1 TABLET BY MOUTH TWICE A DAY Patient taking differently: Take 50 mg by mouth 2 (two) times daily. 03/10/21  Yes Nafziger, Tommi Rumps, NP  Multiple Vitamin (MULTIVITAMIN WITH MINERALS) TABS tablet Take 1 tablet by mouth every morning.   Yes [provider]  potassium chloride 20 MEQ/15ML (10%) SOLN Take 3.8 mLs (5.0667 mEq total) by mouth daily. Patient taking differently: Take 6.7 mEq by mouth See admin instructions. Take 5 mls (6.7 meq) by mouth every morning, take another 5 mls (6.7 meq) with every extra dose of furosemide 04/02/21 07/01/21 Yes [name], PA-C  sodium chloride (OCEAN) 0.65 % SOLN nasal spray Place 1 spray into both nostrils daily as needed for congestion.   Yes [provider]  terazosin (HYTRIN) 10 MG capsule Take 1 capsule (10 mg total) by  mouth daily at 12 noon. Patient taking differently: Take 10 mg by mouth every evening. 12/16/20  Yes Nafziger, Tommi Rumps, NP  traMADol (ULTRAM) 50 MG tablet TAKE 1 TABLET (50 MG TOTAL) BY MOUTH IN THE MORNING, AT NOON, AND AT BEDTIME. Patient taking differently: Take 50 mg by mouth 3 (three) times daily as needed (pain/headache). Take with tylenol 03/10/21  Yes [name], NP    Physical Exam: Vitals:   04/13/21 1345 04/13/21 1406 04/13/21 1815 04/13/21 1859  BP: 136/65 139/62 (!) 103/50 (!) 131/50  Pulse: 75 78 69 62  Resp: 14 16 20 17   Temp:    97.8 F (36.6 C)  TempSrc:    Oral  SpO2: 95% 95% 100% 100%  Weight:        Constitutional: NAD, calm, comfortable Vitals:   04/13/21 1345 04/13/21 1406 04/13/21 1815 04/13/21 1859  BP: 136/65 139/62 (!) 103/50 (!) 131/50  Pulse: 75 78 69 62  Resp: 14 16 20 17   Temp:    97.8 F (36.6 C)  TempSrc:    Oral  SpO2: 95% 95% 100% 100%  Weight:       General: WDWN, Alert and oriented x3.  Eyes: EOMI, PERRL, conjunctivae pale.  Sclera nonicteric HENT:  [place]/AT, external ears normal.  Nares patent without epistasis.  Mucous membranes are moist. Posterior pharynx clear  Neck: Soft, normal range of motion, supple, no masses, Trachea midline Respiratory: clear to auscultation bilaterally, no wheezing, no crackles. Normal respiratory effort. No accessory muscle use.  Cardiovascular: Irregularly irregular rhythm with normal rate no murmurs / rubs / gallops. Mild lower extremity edema. 2+ pedal pulses. Abdomen: Soft, no tenderness, nondistended, no rebound or guarding.  No masses palpated. Bowel sounds normoactive Musculoskeletal: FROM. no cyanosis.Normal muscle tone.  Skin: Warm, dry, intact no rashes, lesions, ulcers. No induration Neurologic: CN 2-12 grossly intact.  Normal speech.  Sensation intact to touch. Strength 4/5 in all extremities.   Psychiatric: Normal judgment and insight.  Normal mood.    Labs on Admission: I have personally reviewed  following labs and imaging studies  CBC: Recent Labs  Lab 04/13/21 1132  WBC 4.2  HGB 7.2*  HCT 23.5*  MCV 98.3  PLT 601    Basic Metabolic Panel: Recent Labs  Lab 04/13/21 1132  NA 138  K 4.1  CL 105  CO2 23  GLUCOSE 126*  BUN 37*  CREATININE 1.37*  CALCIUM 8.5*    GFR: Estimated Creatinine Clearance: 22.8 mL/min (A) (by C-G formula based on SCr of 1.37 mg/dL (H)).  Liver Function Tests: No results for input(s): AST, ALT, ALKPHOS,  BILITOT, PROT, ALBUMIN in the last 168 hours.  Urine analysis:    Component Value Date/Time   COLORURINE YELLOW 09/01/2020 1403   APPEARANCEUR HAZY (A) 09/01/2020 1403   LABSPEC 1.019 09/01/2020 1403   PHURINE 6.0 09/01/2020 1403   GLUCOSEU NEGATIVE 09/01/2020 1403   GLUCOSEU NEGATIVE 12/11/2019 1033   HGBUR NEGATIVE 09/01/2020 [place] 09/01/2020 1403   [place] 09/01/2020 1403   PROTEINUR 30 (A) 09/01/2020 1403   UROBILINOGEN 0.2 12/11/2019 1033   NITRITE NEGATIVE 09/01/2020 1403   LEUKOCYTESUR TRACE (A) 09/01/2020 1403    Radiological Exams on Admission: No results found.  EKG: Independently reviewed.  EKG is reviewed and shows atrial fibrillation with left bundle branch block.  No acute ST elevation or depression.  QTc prolonged at 492  Assessment/Plan Principal Problem:   GI bleed Tara Fields is admitted to Telemetry floor. Eliquis is held with GI bleed and symptomatic anemia Transfuse one unit of PRBC with symptomatic anemia and hx of CHF. Monitor Hgb/Hct. GI consulted-Dr. Candis Schatz  Active Problems:   Symptomatic anemia Patient is a 3 g drop in her hemoglobin in the last few months.  Is symptomatic now with lightheadedness and dizziness when she stands up and shortness of breath with exertion Transfuse 1 unit packed red blood cells GI to evaluate    Essential hypertension Continue home medication of metoprolol, Hytrin. monitor blood pressure    Other persistent atrial  fibrillation  Chronic.  On Eliquis which is held secondary to GI bleeding.    CKD 3B Stable.  Monitor renal function and electrolytes.     COPD (chronic obstructive pulmonary disease)  DuoNeb as needed.  Supplement oxygen provided to keep O2 sat between 92 to 96%    Prolonged QT interval Avoid medications or could further prolong QT interval    Chronic CHF stable    DVT prophylaxis: SCDs for DVT prophylaxis. Eliquis is held with GI bleed  Code Status:   Full Code  Family Communication:  Diagnosis and plan discussed with patient and her son who is at bedside.  They both verbalized understanding agree with plans.  Questions answered.  Further recommendations to follow as clinical indicated Disposition Plan:   Patient is from:  Home  Anticipated DC to:  Home  Anticipated DC date:  Anticipate 2 midnight or more stay in the hospital  Consults called:  Gastroenterology, Dr. Candis Schatz with [facility]  Admission status:  Inpatient   Yevonne Aline Tara Britz MD [facility]  How to contact the [facility] Attending or Consulting provider [place] or covering provider during after hours [place], for this patient?   Check the care team in [facility] and look for a) attending/consulting [facility] provider listed and b) the [facility] team listed Log into [email or web address] and use [place]'s universal password to access. If you do not have the password, please contact the hospital operator. Locate the [facility] provider you are looking for under [facility] and page to a number that you can be directly reached. If you still have difficulty reaching the provider, please page the [facility] (Director on Call) for the Hospitalists listed on amion for assistance.  04/13/2021, 9:21 PM

## 2021-04-14 DIAGNOSIS — D649 Anemia, unspecified: Secondary | ICD-10-CM | POA: Diagnosis not present

## 2021-04-14 DIAGNOSIS — J418 Mixed simple and mucopurulent chronic bronchitis: Secondary | ICD-10-CM | POA: Diagnosis not present

## 2021-04-14 DIAGNOSIS — I10 Essential (primary) hypertension: Secondary | ICD-10-CM

## 2021-04-14 DIAGNOSIS — R195 Other fecal abnormalities: Secondary | ICD-10-CM

## 2021-04-14 LAB — BPAM RBC
Blood Product Expiration Date: 202209282359
ISSUE DATE / TIME: 202208292332
Unit Type and Rh: 5100

## 2021-04-14 LAB — TYPE AND SCREEN
ABO/RH(D): O POS
Antibody Screen: NEGATIVE
Unit division: 0

## 2021-04-14 LAB — CBC
HCT: 25.4 % — ABNORMAL LOW (ref 36.0–46.0)
Hemoglobin: 7.8 g/dL — ABNORMAL LOW (ref 12.0–15.0)
MCH: 30.5 pg (ref 26.0–34.0)
MCHC: 30.7 g/dL (ref 30.0–36.0)
MCV: 99.2 fL (ref 80.0–100.0)
Platelets: 239 10*3/uL (ref 150–400)
RBC: 2.56 MIL/uL — ABNORMAL LOW (ref 3.87–5.11)
RDW: 21.3 % — ABNORMAL HIGH (ref 11.5–15.5)
WBC: 4.4 10*3/uL (ref 4.0–10.5)
nRBC: 0 % (ref 0.0–0.2)

## 2021-04-14 LAB — BASIC METABOLIC PANEL
Anion gap: 6 (ref 5–15)
BUN: 34 mg/dL — ABNORMAL HIGH (ref 8–23)
CO2: 25 mmol/L (ref 22–32)
Calcium: 8.7 mg/dL — ABNORMAL LOW (ref 8.9–10.3)
Chloride: 112 mmol/L — ABNORMAL HIGH (ref 98–111)
Creatinine, Ser: 1.02 mg/dL — ABNORMAL HIGH (ref 0.44–1.00)
GFR, Estimated: 52 mL/min — ABNORMAL LOW (ref >60)
Glucose, Bld: 96 mg/dL (ref 70–99)
Potassium: 3.8 mmol/L (ref 3.5–5.1)
Sodium: 143 mmol/L (ref 135–145)

## 2021-04-14 MED ORDER — POTASSIUM CHLORIDE 20 MEQ/15ML (10%) PO SOLN
7.5000 meq | Freq: Every day | ORAL | Status: DC
  Administered 2021-04-14 – 2021-04-16 (×3): 7.5 meq via ORAL
  Filled 2021-04-14 (×3): qty 15

## 2021-04-14 NOTE — Consult Note (Addendum)
Referring Provider:  [facility]         Primary Care Physician:  [name], NP Primary Gastroenterologist: none   Reason for Consultation:    GI bleed               ASSESSMENT / PLAN   # 87 yo female with heme + anemia. Dark stool on chronic iron. Takes Eliquis + daily baby ASA.  Baseline hgb 11-12 range , down to  7.2. On iron replacement her TIBC is elevated, iron sat low normal at 13%,  ferritin normal at 82. Rule out PUD, AVMs, or gastrointestinal neoplasm --Will likely need an EGD tomorrow after Eliquis washout ( last dose was yesterday morning). The risks and benefits of EGD with possible biopsies were discussed with the patient who agrees to proceed.  --I will try and get colonoscopy report from [facility] but may be difficult.  --Will give clear liquids then NPO after MN --Hgb improved on ~ 0.5 grams after a unit of blood 7.2 >>> 7.8 but she feels better.  --She is not on an acid blocker which I suspect is secondary to prolonged QT. Okay to hold off until EGD, doesn't seem to be actively bleeding.  --Hopefully colonoscopy will not be needed. She reports having had a colonoscopy a few years back in [place] ( for rectal bleeding). She is unaware of any findings. --Needs B12 replacement   # B12 deficiency. B12 is 90.  --Needs B12 replacement  # PAF, on Eliquis  # Prolonged QT  # Severe AS s/p TAVR July 2020. Takes a daily baby asa  # Chronic systolic heart failure. LVEF 25-30%  # COPD  # Enlarged CBD 1.8 cm, stable.  ( CT scan in January 2022 and May 2022 reviewed). No evidence for duct stones. LFTs normal at time of scans. Post cholecystectomy   HISTORY OF PRESENT ILLNESS                                                                                                                         Chief Complaint:  anemia  Tara Fields is a 87 y.o. female with a past medical history significant for severe AS s/p TAVR July 2020, moderate mitral  regurgitation, HTN, , Afib on Eliquis, prolonged QT, carotid artery disease, COPD, left lung cancer DM, CKD3, diverticulosisi, cholecystectomy, appendectomy, hysterectomy.  See PMH for any additional medical history.    ED course:  Patient presented to [place] yesterday with weakness , SOB with exertion, and body aches. Stools dark but on iron at home. Hgb 7.3, down from 10.7. MCV 99. BUN 34, Cr 10.2 ( has CKD), TIBC 525, iron sat 13, ferritin 82, folate normal. B12 90. FOBT +.  A unit of PRBC was ordered. Patient sent to [facility] for admission.  VSS.   Patient is a fairly good historian except with dates of events. She has been on iron for years. She says her stools have always been  dark on iron but lately they may have been a little darker but she isn't sure. No NSAIDS other than daily baby asa. In the last year she has struggled with nausea but believes it is related to oral potassium. No recent abnormal weight loss. No abdominal pain. No reflux symptoms. Her BMs are at baseline.   Patient says she was hospitalized in [place] several years ago for rectal bleedings. Sounds like workup not pursued. Then a couple of years later she was readmitted for rectal bleeding and underwent colonoscopy but unsure of what was found.   SIGNIFICANT DIAGNOTIC STUDIES     EKG: atrial fibrillation with left bundle branch block.  No acute ST elevation or depression.  QTc prolonged at 492   Older data reviewed:  May 2022 CTAP w/ contrast for abdominal pain, nausea and SOB -- CHF with bilateral Insterstitial thickening and small bilateral pleural effusion. CBD 1.8 cm with distal tapering, unchanged. NO evidence of duct stone. Pancreas unremarkable. A 2.1 cm left adrenal mass, unchanged from previous studies in May 2021.    PREVIOUS ENDOSCOPIC EVALUATIONS    None found.   Past Medical History:  Diagnosis Date   Aortic stenosis    s/p TAVR 26 mm Edwards Sapien 3 THV July 2020   CAD (coronary artery  disease)    COPD (chronic obstructive pulmonary disease) (HCC)    Diabetes mellitus (HCC)    Diverticulitis    GERD (gastroesophageal reflux disease)    HTN (hypertension)    Hyperlipidemia    Legally blind in left eye, as defined in [place]    Tobacco abuse     Past Surgical History:  Procedure Laterality Date   ABDOMINAL HYSTERECTOMY     APPENDECTOMY     BACK SURGERY     CATARACT EXTRACTION, BILATERAL     CHOLECYSTECTOMY      Prior to Admission medications   Medication Sig Start Date End Date Taking? Authorizing Provider  acetaminophen (TYLENOL) 500 MG tablet Take 500 mg by mouth 3 (three) times daily as needed for headache (pain). Take with tramadol   Yes [provider]  atorvastatin (LIPITOR) 40 MG tablet TAKE 1 TABLET BY MOUTH DAILY AT 6 PM. Patient taking differently: Take 40 mg by mouth every evening. 03/10/21  Yes Nafziger, Tommi Rumps, NP  cholecalciferol (VITAMIN D3) 25 MCG (1000 UNIT) tablet Take 1,000 Units by mouth 2 (two) times daily.   Yes [provider]  Coenzyme Q10 (CO Q-10) 300 MG CAPS Take 300 mg by mouth in the morning.   Yes [provider]  [facility] ASPIRIN ADULT LOW DOSE 81 MG chewable tablet CHEW 1 TABLET (81 MG TOTAL) BY MOUTH DAILY. Patient taking differently: Chew 81 mg by mouth every morning. 03/10/21  Yes Nafziger, Tommi Rumps, NP  ELIQUIS 5 MG TABS tablet TAKE 1 TABLET BY MOUTH TWICE A DAY Patient taking differently: Take 5 mg by mouth 2 (two) times daily. 03/10/21  Yes Nafziger, Tommi Rumps, NP  fenofibrate 160 MG tablet TAKE 1 TABLET BY MOUTH EVERY DAY Patient taking differently: Take 160 mg by mouth every morning. 03/10/21  Yes Nafziger, Tommi Rumps, NP  ferrous gluconate (FERGON) 324 MG tablet TAKE 1 TABLET BY MOUTH DAILY WITH BREAKFAST Patient taking differently: Take 324 mg by mouth every morning. 03/10/21  Yes Nafziger, Tommi Rumps, NP  furosemide (LASIX) 20 MG tablet Take 1 tablet (20 mg total) by mouth daily. Patient taking differently: Take 20 mg by mouth See  admin instructions. Take one tablet (20 mg) by  mouth every morning, take another tablet (20 mg) in the afternoon as needed for weight gain of 2-3 lbs/day 04/02/21  Yes [name], PA-C  Lidocaine-Menthol, Spray, 4-1 % LIQD Apply 1 spray topically daily as needed (back/knee pain/restless legs).   Yes [provider]  losartan (COZAAR) 100 MG tablet TAKE 1 TABLET BY MOUTH EVERY DAY Patient taking differently: Take 100 mg by mouth every morning. 01/14/21  Yes Burnell Blanks, MD  Magnesium 200 MG TABS Take 200 mg by mouth every morning.   Yes [provider]  metoprolol tartrate (LOPRESSOR) 50 MG tablet TAKE 1 TABLET BY MOUTH TWICE A DAY Patient taking differently: Take 50 mg by mouth 2 (two) times daily. 03/10/21  Yes Nafziger, Tommi Rumps, NP  Multiple Vitamin (MULTIVITAMIN WITH MINERALS) TABS tablet Take 1 tablet by mouth every morning.   Yes [provider]  potassium chloride 20 MEQ/15ML (10%) SOLN Take 3.8 mLs (5.0667 mEq total) by mouth daily. Patient taking differently: Take 6.7 mEq by mouth See admin instructions. Take 5 mls (6.7 meq) by mouth every morning, take another 5 mls (6.7 meq) with every extra dose of furosemide 04/02/21 07/01/21 Yes [name], PA-C  sodium chloride (OCEAN) 0.65 % SOLN nasal spray Place 1 spray into both nostrils daily as needed for congestion.   Yes [provider]  terazosin (HYTRIN) 10 MG capsule Take 1 capsule (10 mg total) by mouth daily at 12 noon. Patient taking differently: Take 10 mg by mouth every evening. 12/16/20  Yes Nafziger, Tommi Rumps, NP  traMADol (ULTRAM) 50 MG tablet TAKE 1 TABLET (50 MG TOTAL) BY MOUTH IN THE MORNING, AT NOON, AND AT BEDTIME. Patient taking differently: Take 50 mg by mouth 3 (three) times daily as needed (pain/headache). Take with tylenol 03/10/21  Yes Nafziger, Tommi Rumps, NP    Current Facility-Administered Medications  Medication Dose Route Frequency Provider Last Rate Last Admin   acetaminophen  (TYLENOL) tablet 650 mg  650 mg Oral Q6H PRN Chotiner, Yevonne Aline, MD       Or   acetaminophen (TYLENOL) suppository 650 mg  650 mg Rectal Q6H PRN Chotiner, Yevonne Aline, MD       atorvastatin (LIPITOR) tablet 40 mg  40 mg Oral QPM Chotiner, Yevonne Aline, MD   40 mg at 04/13/21 2251   fenofibrate tablet 160 mg  160 mg Oral q morning Chotiner, Yevonne Aline, MD       furosemide (LASIX) tablet 20 mg  20 mg Oral Daily Chotiner, Yevonne Aline, MD       lactated ringers infusion   Intravenous Continuous Chotiner, Yevonne Aline, MD 50 mL/hr at 04/14/21 0200 New Bag at 04/14/21 0200   metoprolol tartrate (LOPRESSOR) tablet 50 mg  50 mg Oral BID Chotiner, Yevonne Aline, MD       potassium chloride NICU/PED ORAL syringe 2 mEq/mL  7.6 mEq Oral Daily Chotiner, Yevonne Aline, MD       terazosin (HYTRIN) capsule 10 mg  10 mg Oral QPM Chotiner, Yevonne Aline, MD   10 mg at 04/13/21 2251   traMADol (ULTRAM) tablet 50 mg  50 mg Oral Q12H PRN Chotiner, Yevonne Aline, MD   50 mg at 04/14/21 6759    Allergies as of 04/13/2021 - Review Complete 04/13/2021  Allergen Reaction Noted   Amlodipine besy-benazepril hcl Cough 09/10/2014   Hctz [hydrochlorothiazide] Other (See Comments) 09/10/2014   Other Other (See Comments) 12/18/2020   Cyclobenzaprine Other (See Comments) 08/27/2020   Diclofenac sodium Diarrhea 10/10/2017  Hydralazine Other (See Comments) 08/13/2014   Methylprednisolone Other (See Comments) 08/13/2014   Oxycodone Other (See Comments) 12/18/2020    Family History  Problem Relation Age of Onset   Diabetes Mother    Stroke Brother    Cerebral palsy Brother    Coronary artery disease Neg Hx     Social History   Socioeconomic History   Marital status: Widowed    Spouse name: Not on file   Number of children: 2   Years of education: Not on file   Highest education level: High school graduate  Occupational History   Occupation: Retired    Comment: homemaker  Tobacco Use   Smoking status: Former    Packs/day: 1.00     Years: 70.00    Pack years: 70.00    Types: Cigarettes    Quit date: 12/15/2019    Years since quitting: 1.3   Smokeless tobacco: Never   Tobacco comments:    Pt states quiting a year ago. 01/03/20 ARJ  Vaping Use   Vaping Use: Never used  Substance and Sexual Activity   Alcohol use: No    Alcohol/week: 0.0 standard drinks   Drug use: No   Sexual activity: Not Currently  Other Topics Concern   Not on file  Social History Narrative   Widowed- was married for 53 years    Has two sons       Social Determinants of Radio broadcast assistant Strain: Low Risk    Difficulty of Paying Living Expenses: Not hard at all  Food Insecurity: No Food Insecurity   Worried About Charity fundraiser in the Last Year: Never true   Arboriculturist in the Last Year: Never true  Transportation Needs: No Transportation Needs   Lack of Transportation (Medical): No   Lack of Transportation (Non-Medical): No  Physical Activity: Insufficiently Active   Days of Exercise per Week: 5 days   Minutes of Exercise per Session: 10 min  Stress: No Stress Concern Present   Feeling of Stress : Not at all  Social Connections: Socially Isolated   Frequency of Communication with Friends and Family: Three times a week   Frequency of Social Gatherings with Friends and Family: Three times a week   Attends Religious Services: Never   Active Member of Clubs or Organizations: No   Attends Archivist Meetings: Never   Marital Status: Widowed  Human resources officer Violence: Not At Risk   Fear of Current or Ex-Partner: No   Emotionally Abused: No   Physically Abused: No   Sexually Abused: No    Review of Systems: All systems reviewed and negative except where noted in HPI.   OBJECTIVE    Physical Exam: Vital signs in last 24 hours: Temp:  [97.5 F (36.4 C)-98.9 F (37.2 C)] 98.9 F (37.2 C) (08/30 0610) Pulse Rate:  [62-97] 97 (08/30 0610) Resp:  [14-20] 17 (08/30 0610) BP: (103-142)/(50-71)  136/71 (08/30 0610) SpO2:  [94 %-100 %] 94 % (08/30 0610) Weight:  [67.1 kg] 67.1 kg (08/29 1122) Last BM Date: 04/13/21 General:   Alert  female in NAD Psych:  Pleasant, cooperative. Normal mood and affect. Eyes:  Pupils equal, sclera clear, no icterus.   Conjunctiva pink. Ears:  Normal auditory acuity. Nose:  No deformity, discharge,  or lesions. Neck:  Supple; no masses Lungs:  Clear throughout to auscultation.   No wheezes, crackles, or rhonchi.  Heart:  Regular rate and rhythm;  no lower  extremity edema Abdomen:  Soft, non-distended, nontender, BS active, no palp mass   Rectal:  Deferred  Msk:  Symmetrical without gross deformities. . Neurologic:  Alert and  oriented x4;  grossly normal neurologically. Skin:  Intact without significant lesions or rashes.   Scheduled inpatient medications  atorvastatin  40 mg Oral QPM   fenofibrate  160 mg Oral q morning   furosemide  20 mg Oral Daily   metoprolol tartrate  50 mg Oral BID   potassium chloride  7.6 mEq Oral Daily   terazosin  10 mg Oral QPM      Intake/Output from previous day: 08/29 0701 - 08/30 0700 In: 736 [I.V.:450; Blood:286] Out: 400 [Urine:400] Intake/Output this shift: No intake/output data recorded.   Lab Results: Recent Labs    04/13/21 1132 04/14/21 0523  WBC 4.2 4.4  HGB 7.2* 7.8*  HCT 23.5* 25.4*  PLT 251 239   BMET Recent Labs    04/13/21 1132 04/14/21 0523  NA 138 143  K 4.1 3.8  CL 105 112*  CO2 23 25  GLUCOSE 126* 96  BUN 37* 34*  CREATININE 1.37* 1.02*  CALCIUM 8.5* 8.7*   LFT No results for input(s): PROT, ALBUMIN, AST, ALT, ALKPHOS, BILITOT, BILIDIR, IBILI in the last 72 hours. PT/INR No results for input(s): LABPROT, INR in the last 72 hours. Hepatitis Panel No results for input(s): HEPBSAG, HCVAB, HEPAIGM, HEPBIGM in the last 72 hours.   . CBC Latest Ref Rng & Units 04/14/2021 04/13/2021 01/29/2021  WBC 4.0 - 10.5 K/uL 4.4 4.2 6.6  Hemoglobin 12.0 - 15.0 g/dL 7.8(L) 7.2(L)  10.7(L)  Hematocrit 36.0 - 46.0 % 25.4(L) 23.5(L) 34.2(L)  Platelets 150 - 400 K/uL 239 251 345    . CMP Latest Ref Rng & Units 04/14/2021 04/13/2021 04/01/2021  Glucose 70 - 99 mg/dL 96 126(H) 120(H)  BUN 8 - 23 mg/dL 34(H) 37(H) 49(H)  Creatinine 0.44 - 1.00 mg/dL 1.02(H) 1.37(H) 1.39(H)  Sodium 135 - 145 mmol/L 143 138 139  Potassium 3.5 - 5.1 mmol/L 3.8 4.1 5.0  Chloride 98 - 111 mmol/L 112(H) 105 100  CO2 22 - 32 mmol/L 25 23 26   Calcium 8.9 - 10.3 mg/dL 8.7(L) 8.5(L) 9.4  Total Protein 6.0 - 8.3 g/dL - - -  Total Bilirubin 0.2 - 1.2 mg/dL - - -  Alkaline Phos 39 - 117 U/L - - -  AST 0 - 37 U/L - - -  ALT 0 - 35 U/L - - -   Studies/Results: No results found.  Principal Problem:   GI bleed Active Problems:   Essential hypertension   Other persistent atrial fibrillation (HCC)   COPD (chronic obstructive pulmonary disease) (HCC)   Symptomatic anemia   Prolonged QT interval   CKD (chronic kidney disease), stage III ([place])    Tye Savoy, NP-C @  04/14/2021, 9:06 AM  I have reviewed the entire case in detail with the above APP and discussed the plan in detail.  Therefore, I agree with the diagnoses recorded above. In addition,  I have personally interviewed and examined the patient.  My additional thoughts are as follows:  Elderly patient admitted with generalized fatigue and arthritic symptoms, found to have marked normocytic anemia with normal iron levels and markedly decreased B12 level.  She was also found to be heme positive, significance of which and contribution to anemia is as yet unclear. She reports a good appetite, denies dysphagia nausea or vomiting.  Stable bowel habits and no visible  rectal bleeding reported by patient.  She needs B12 replacement (1000 mcg weekly for a month, then probably monthly thereafter depending on level.)  Patient reports recent "dark stool", so for possibility of upper GI source of blood loss in a patient on aspirin and Eliquis with  heme positive stool, plan is for upper endoscopy tomorrow.  She was agreeable after discussion of procedure and risks.  The benefits and risks of the planned procedure were described in detail with the patient or (when appropriate) their health care proxy.  Risks were outlined as including, but not limited to, bleeding, infection, perforation, adverse medication reaction leading to cardiac or pulmonary decompensation, pancreatitis (if ERCP).  The limitation of incomplete mucosal visualization was also discussed.  No guarantees or warranties were given.  Patient at increased risk for cardiopulmonary complications of procedure due to medical comorbidities.  Patient can eat today, then n.p.o. after midnight.  Per patient's request, son Tara Fields will be updated by phone.  Nelida Meuse III Office:[phone]

## 2021-04-14 NOTE — Progress Notes (Signed)
PROGRESS NOTE    Tara Fields  PPI:[id number] DOB: 08/07/1928 DOA: 04/13/2021 PCP: [name], NP    Brief Narrative:  87 y.o. female with medical history significant for HTN, COPD, CAD, HLD, s/p TAVR who presents for evaluation of generalized weakness and fatigue with shortness of breath with exertion. Pt was found to have hgb of 7.3, down from 10.7 two months ago. Stools heme pos. Pt admitted for further w/u  Assessment & Plan:   Principal Problem:   GI bleed Active Problems:   Essential hypertension   Other persistent atrial fibrillation (HCC)   COPD (chronic obstructive pulmonary disease) (HCC)   Symptomatic anemia   Prolonged QT interval   CKD (chronic kidney disease), stage III (HCC)  Principal Problem: Acute blood loss anemia Eliquis is held at time of presentation Patient transfused one unit of PRBC with symptomatic anemia and hx of CHF. Gastroenterology consulted with plans for EGD tomorrow   Active Problems:   Symptomatic anemia Patient presented with 3 g hemoglobin drop from 2 months prior while on anticoagulation  Patient noted to have lightheadedness and dizziness when she stands up and shortness of breath with exertion Patient given 1 unit of PRBCs     Essential hypertension Continue home medication of metoprolol, Hytrin. -Continue to titrate blood pressure medications as needed     Other persistent atrial fibrillation  Chronic -Eliquis on hold as per above, would resume when okay with GI     CKD 3B Stable.  Repeat basic metabolic panel in the morning     COPD (chronic obstructive pulmonary disease)  DuoNeb as needed.  Supplement oxygen provided to keep O2 sat between 92 to 96% -Currently on minimal O2 support     Prolonged QT interval Avoid medications or could further prolong QT interval     Chronic systolic CHF Most recent sets 2D echocardiogram from May 2022 demonstrated EF of 25 to 30% with global hypokinesis stable and appears  euvolemic at this time -Continued on oral Lasix  -Repeat basic metabolic panel in the morning    DVT prophylaxis: SCDs Code Status: Full code Family Communication: Patient in room, family not at bedside  Status is: Inpatient  Remains inpatient appropriate because:Inpatient level of care appropriate due to severity of illness  Dispo: The patient is from: Home              Anticipated d/c is to: Home              Patient currently is not medically stable to d/c.   Difficult to place patient No       Consultants:  Gastroenterology  Procedures:    Antimicrobials: Anti-infectives (From admission, onward)    None       Subjective: Denies abdominal pain or nausea  Objective: Vitals:   04/14/21 0314 04/14/21 0610 04/14/21 1048 04/14/21 1219  BP: (!) 142/67 136/71 (!) 150/80 (!) 132/49  Pulse: 70 97 91 67  Resp: 16 17  18   Temp: 98 F (36.7 C) 98.9 F (37.2 C)  97.6 F (36.4 C)  TempSrc: Oral Oral  Oral  SpO2: 98% 94%  99%  Weight:        Intake/Output Summary (Last 24 hours) at 04/14/2021 1751 Last data filed at 04/14/2021 1115 Gross per 24 hour  Intake 736 ml  Output 850 ml  Net -114 ml   Filed Weights   04/13/21 1122  Weight: 67.1 kg    Examination: General exam: Awake, laying in bed, in nad  Respiratory system: Normal respiratory effort, no wheezing Cardiovascular system: regular rate, s1, s2 Gastrointestinal system: Soft, nondistended, positive BS Central nervous system: CN2-12 grossly intact, strength intact Extremities: Perfused, no clubbing Skin: Normal skin turgor, no notable skin lesions seen Psychiatry: Mood normal // no visual hallucinations   Data Reviewed: I have personally reviewed following labs and imaging studies  CBC: Recent Labs  Lab 04/13/21 1132 04/14/21 0523  WBC 4.2 4.4  HGB 7.2* 7.8*  HCT 23.5* 25.4*  MCV 98.3 99.2  PLT 251 329   Basic Metabolic Panel: Recent Labs  Lab 04/13/21 1132 04/14/21 0523  NA 138 143   K 4.1 3.8  CL 105 112*  CO2 23 25  GLUCOSE 126* 96  BUN 37* 34*  CREATININE 1.37* 1.02*  CALCIUM 8.5* 8.7*   GFR: Estimated Creatinine Clearance: 30.7 mL/min (A) (by C-G formula based on SCr of 1.02 mg/dL (H)). Liver Function Tests: No results for input(s): AST, ALT, ALKPHOS, BILITOT, PROT, ALBUMIN in the last 168 hours. No results for input(s): LIPASE, AMYLASE in the last 168 hours. No results for input(s): AMMONIA in the last 168 hours. Coagulation Profile: No results for input(s): INR, PROTIME in the last 168 hours. Cardiac Enzymes: No results for input(s): CKTOTAL, CKMB, CKMBINDEX, TROPONINI in the last 168 hours. BNP (last 3 results) No results for input(s): PROBNP in the last 8760 hours. HbA1C: No results for input(s): HGBA1C in the last 72 hours. CBG: Recent Labs  Lab 04/13/21 1856  GLUCAP 147*   Lipid Profile: No results for input(s): CHOL, HDL, LDLCALC, TRIG, CHOLHDL, LDLDIRECT in the last 72 hours. Thyroid Function Tests: No results for input(s): TSH, T4TOTAL, FREET4, T3FREE, THYROIDAB in the last 72 hours. Anemia Panel: Recent Labs    04/13/21 1132 04/13/21 1236  VITAMINB12  --  90*  FOLATE  --  7.5  FERRITIN  --  82  TIBC  --  525*  IRON  --  67  RETICCTPCT 7.8*  --    Sepsis Labs: No results for input(s): PROCALCITON, LATICACIDVEN in the last 168 hours.  No results found for this or any previous visit (from the past 240 hour(s)).   Radiology Studies: No results found.  Scheduled Meds:  atorvastatin  40 mg Oral QPM   fenofibrate  160 mg Oral q morning   furosemide  20 mg Oral Daily   metoprolol tartrate  50 mg Oral BID   potassium chloride  7.5 mEq Oral Daily   terazosin  10 mg Oral QPM   Continuous Infusions:  lactated ringers 50 mL/hr at 04/14/21 0200     LOS: 1 day   Marylu Lund, MD [facility] Pager On Amion  If 7PM-7AM, please contact night-coverage 04/14/2021, 5:51 PM

## 2021-04-14 NOTE — H&P (View-Only) (Signed)
Referring Provider:  [facility]         Primary Care Physician:  [name], NP Primary Gastroenterologist: none   Reason for Consultation:    GI bleed               ASSESSMENT / PLAN   # 87 yo female with heme + anemia. Dark stool on chronic iron. Takes Eliquis + daily baby ASA.  Baseline hgb 11-12 range , down to  7.2. On iron replacement her TIBC is elevated, iron sat low normal at 13%,  ferritin normal at 82. Rule out PUD, AVMs, or gastrointestinal neoplasm --Will likely need an EGD tomorrow after Eliquis washout ( last dose was yesterday morning). The risks and benefits of EGD with possible biopsies were discussed with the patient who agrees to proceed.  --I will try and get colonoscopy report from [facility] but may be difficult.  --Will give clear liquids then NPO after MN --Hgb improved on ~ 0.5 grams after a unit of blood 7.2 >>> 7.8 but she feels better.  --She is not on an acid blocker which I suspect is secondary to prolonged QT. Okay to hold off until EGD, doesn't seem to be actively bleeding.  --Hopefully colonoscopy will not be needed. She reports having had a colonoscopy a few years back in [place] ( for rectal bleeding). She is unaware of any findings. --Needs B12 replacement   # B12 deficiency. B12 is 90.  --Needs B12 replacement  # PAF, on Eliquis  # Prolonged QT  # Severe AS s/p TAVR July 2020. Takes a daily baby asa  # Chronic systolic heart failure. LVEF 25-30%  # COPD  # Enlarged CBD 1.8 cm, stable.  ( CT scan in January 2022 and May 2022 reviewed). No evidence for duct stones. LFTs normal at time of scans. Post cholecystectomy   HISTORY OF PRESENT ILLNESS                                                                                                                         Chief Complaint:  anemia  Tara Fields is a 87 y.o. female with a past medical history significant for severe AS s/p TAVR July 2020, moderate mitral  regurgitation, HTN, , Afib on Eliquis, prolonged QT, carotid artery disease, COPD, left lung cancer DM, CKD3, diverticulosisi, cholecystectomy, appendectomy, hysterectomy.  See PMH for any additional medical history.    ED course:  Patient presented to [facility] yesterday with weakness , SOB with exertion, and body aches. Stools dark but on iron at home. Hgb 7.3, down from 10.7. MCV 99. BUN 34, Cr 10.2 ( has CKD), TIBC 525, iron sat 13, ferritin 82, folate normal. B12 90. FOBT +.  A unit of PRBC was ordered. Patient sent to [facility] for admission.  VSS.   Patient is a fairly good historian except with dates of events. She has been on iron for years. She says her stools have always been  dark on iron but lately they may have been a little darker but she isn't sure. No NSAIDS other than daily baby asa. In the last year she has struggled with nausea but believes it is related to oral potassium. No recent abnormal weight loss. No abdominal pain. No reflux symptoms. Her BMs are at baseline.   Patient says she was hospitalized in [place] several years ago for rectal bleedings. Sounds like workup not pursued. Then a couple of years later she was readmitted for rectal bleeding and underwent colonoscopy but unsure of what was found.   SIGNIFICANT DIAGNOTIC STUDIES     EKG: atrial fibrillation with left bundle branch block.  No acute ST elevation or depression.  QTc prolonged at 492   Older data reviewed:  May 2022 CTAP w/ contrast for abdominal pain, nausea and SOB -- CHF with bilateral Insterstitial thickening and small bilateral pleural effusion. CBD 1.8 cm with distal tapering, unchanged. NO evidence of duct stone. Pancreas unremarkable. A 2.1 cm left adrenal mass, unchanged from previous studies in May 2021.    PREVIOUS ENDOSCOPIC EVALUATIONS    None found.   Past Medical History:  Diagnosis Date   Aortic stenosis    s/p TAVR 26 mm Edwards Sapien 3 THV July 2020   CAD (coronary artery  disease)    COPD (chronic obstructive pulmonary disease) (HCC)    Diabetes mellitus (HCC)    Diverticulitis    GERD (gastroesophageal reflux disease)    HTN (hypertension)    Hyperlipidemia    Legally blind in left eye, as defined in [place]    Tobacco abuse     Past Surgical History:  Procedure Laterality Date   ABDOMINAL HYSTERECTOMY     APPENDECTOMY     BACK SURGERY     CATARACT EXTRACTION, BILATERAL     CHOLECYSTECTOMY      Prior to Admission medications   Medication Sig Start Date End Date Taking? Authorizing Provider  acetaminophen (TYLENOL) 500 MG tablet Take 500 mg by mouth 3 (three) times daily as needed for headache (pain). Take with tramadol   Yes [provider]  atorvastatin (LIPITOR) 40 MG tablet TAKE 1 TABLET BY MOUTH DAILY AT 6 PM. Patient taking differently: Take 40 mg by mouth every evening. 03/10/21  Yes Nafziger, Tommi Rumps, NP  cholecalciferol (VITAMIN D3) 25 MCG (1000 UNIT) tablet Take 1,000 Units by mouth 2 (two) times daily.   Yes [provider]  Coenzyme Q10 (CO Q-10) 300 MG CAPS Take 300 mg by mouth in the morning.   Yes [provider]  [facility] ASPIRIN ADULT LOW DOSE 81 MG chewable tablet CHEW 1 TABLET (81 MG TOTAL) BY MOUTH DAILY. Patient taking differently: Chew 81 mg by mouth every morning. 03/10/21  Yes Nafziger, Tommi Rumps, NP  ELIQUIS 5 MG TABS tablet TAKE 1 TABLET BY MOUTH TWICE A DAY Patient taking differently: Take 5 mg by mouth 2 (two) times daily. 03/10/21  Yes Nafziger, Tommi Rumps, NP  fenofibrate 160 MG tablet TAKE 1 TABLET BY MOUTH EVERY DAY Patient taking differently: Take 160 mg by mouth every morning. 03/10/21  Yes Nafziger, Tommi Rumps, NP  ferrous gluconate (FERGON) 324 MG tablet TAKE 1 TABLET BY MOUTH DAILY WITH BREAKFAST Patient taking differently: Take 324 mg by mouth every morning. 03/10/21  Yes Nafziger, Tommi Rumps, NP  furosemide (LASIX) 20 MG tablet Take 1 tablet (20 mg total) by mouth daily. Patient taking differently: Take 20 mg by mouth See  admin instructions. Take one tablet (20 mg) by  mouth every morning, take another tablet (20 mg) in the afternoon as needed for weight gain of 2-3 lbs/day 04/02/21  Yes [name], PA-C  Lidocaine-Menthol, Spray, 4-1 % LIQD Apply 1 spray topically daily as needed (back/knee pain/restless legs).   Yes [provider]  losartan (COZAAR) 100 MG tablet TAKE 1 TABLET BY MOUTH EVERY DAY Patient taking differently: Take 100 mg by mouth every morning. 01/14/21  Yes Burnell Blanks, MD  Magnesium 200 MG TABS Take 200 mg by mouth every morning.   Yes [provider]  metoprolol tartrate (LOPRESSOR) 50 MG tablet TAKE 1 TABLET BY MOUTH TWICE A DAY Patient taking differently: Take 50 mg by mouth 2 (two) times daily. 03/10/21  Yes Nafziger, Tommi Rumps, NP  Multiple Vitamin (MULTIVITAMIN WITH MINERALS) TABS tablet Take 1 tablet by mouth every morning.   Yes [provider]  potassium chloride 20 MEQ/15ML (10%) SOLN Take 3.8 mLs (5.0667 mEq total) by mouth daily. Patient taking differently: Take 6.7 mEq by mouth See admin instructions. Take 5 mls (6.7 meq) by mouth every morning, take another 5 mls (6.7 meq) with every extra dose of furosemide 04/02/21 07/01/21 Yes [name], PA-C  sodium chloride (OCEAN) 0.65 % SOLN nasal spray Place 1 spray into both nostrils daily as needed for congestion.   Yes [provider]  terazosin (HYTRIN) 10 MG capsule Take 1 capsule (10 mg total) by mouth daily at 12 noon. Patient taking differently: Take 10 mg by mouth every evening. 12/16/20  Yes Nafziger, Tommi Rumps, NP  traMADol (ULTRAM) 50 MG tablet TAKE 1 TABLET (50 MG TOTAL) BY MOUTH IN THE MORNING, AT NOON, AND AT BEDTIME. Patient taking differently: Take 50 mg by mouth 3 (three) times daily as needed (pain/headache). Take with tylenol 03/10/21  Yes Nafziger, Tommi Rumps, NP    Current Facility-Administered Medications  Medication Dose Route Frequency Provider Last Rate Last Admin   acetaminophen  (TYLENOL) tablet 650 mg  650 mg Oral Q6H PRN Chotiner, Yevonne Aline, MD       Or   acetaminophen (TYLENOL) suppository 650 mg  650 mg Rectal Q6H PRN Chotiner, Yevonne Aline, MD       atorvastatin (LIPITOR) tablet 40 mg  40 mg Oral QPM Chotiner, Yevonne Aline, MD   40 mg at 04/13/21 2251   fenofibrate tablet 160 mg  160 mg Oral q morning Chotiner, Yevonne Aline, MD       furosemide (LASIX) tablet 20 mg  20 mg Oral Daily Chotiner, Yevonne Aline, MD       lactated ringers infusion   Intravenous Continuous Chotiner, Yevonne Aline, MD 50 mL/hr at 04/14/21 0200 New Bag at 04/14/21 0200   metoprolol tartrate (LOPRESSOR) tablet 50 mg  50 mg Oral BID Chotiner, Yevonne Aline, MD       potassium chloride NICU/PED ORAL syringe 2 mEq/mL  7.6 mEq Oral Daily Chotiner, Yevonne Aline, MD       terazosin (HYTRIN) capsule 10 mg  10 mg Oral QPM Chotiner, Yevonne Aline, MD   10 mg at 04/13/21 2251   traMADol (ULTRAM) tablet 50 mg  50 mg Oral Q12H PRN Chotiner, Yevonne Aline, MD   50 mg at 04/14/21 9983    Allergies as of 04/13/2021 - Review Complete 04/13/2021  Allergen Reaction Noted   Amlodipine besy-benazepril hcl Cough 09/10/2014   Hctz [hydrochlorothiazide] Other (See Comments) 09/10/2014   Other Other (See Comments) 12/18/2020   Cyclobenzaprine Other (See Comments) 08/27/2020   Diclofenac sodium Diarrhea 10/10/2017  Hydralazine Other (See Comments) 08/13/2014   Methylprednisolone Other (See Comments) 08/13/2014   Oxycodone Other (See Comments) 12/18/2020    Family History  Problem Relation Age of Onset   Diabetes Mother    Stroke Brother    Cerebral palsy Brother    Coronary artery disease Neg Hx     Social History   Socioeconomic History   Marital status: Widowed    Spouse name: Not on file   Number of children: 2   Years of education: Not on file   Highest education level: High school graduate  Occupational History   Occupation: Retired    Comment: homemaker  Tobacco Use   Smoking status: Former    Packs/day: 1.00     Years: 70.00    Pack years: 70.00    Types: Cigarettes    Quit date: 12/15/2019    Years since quitting: 1.3   Smokeless tobacco: Never   Tobacco comments:    Pt states quiting a year ago. 01/03/20 ARJ  Vaping Use   Vaping Use: Never used  Substance and Sexual Activity   Alcohol use: No    Alcohol/week: 0.0 standard drinks   Drug use: No   Sexual activity: Not Currently  Other Topics Concern   Not on file  Social History Narrative   Widowed- was married for 66 years    Has two sons       Social Determinants of Radio broadcast assistant Strain: Low Risk    Difficulty of Paying Living Expenses: Not hard at all  Food Insecurity: No Food Insecurity   Worried About Charity fundraiser in the Last Year: Never true   Arboriculturist in the Last Year: Never true  Transportation Needs: No Transportation Needs   Lack of Transportation (Medical): No   Lack of Transportation (Non-Medical): No  Physical Activity: Insufficiently Active   Days of Exercise per Week: 5 days   Minutes of Exercise per Session: 10 min  Stress: No Stress Concern Present   Feeling of Stress : Not at all  Social Connections: Socially Isolated   Frequency of Communication with Friends and Family: Three times a week   Frequency of Social Gatherings with Friends and Family: Three times a week   Attends Religious Services: Never   Active Member of Clubs or Organizations: No   Attends Archivist Meetings: Never   Marital Status: Widowed  Human resources officer Violence: Not At Risk   Fear of Current or Ex-Partner: No   Emotionally Abused: No   Physically Abused: No   Sexually Abused: No    Review of Systems: All systems reviewed and negative except where noted in HPI.   OBJECTIVE    Physical Exam: Vital signs in last 24 hours: Temp:  [97.5 F (36.4 C)-98.9 F (37.2 C)] 98.9 F (37.2 C) (08/30 0610) Pulse Rate:  [62-97] 97 (08/30 0610) Resp:  [14-20] 17 (08/30 0610) BP: (103-142)/(50-71)  136/71 (08/30 0610) SpO2:  [94 %-100 %] 94 % (08/30 0610) Weight:  [67.1 kg] 67.1 kg (08/29 1122) Last BM Date: 04/13/21 General:   Alert  female in NAD Psych:  Pleasant, cooperative. Normal mood and affect. Eyes:  Pupils equal, sclera clear, no icterus.   Conjunctiva pink. Ears:  Normal auditory acuity. Nose:  No deformity, discharge,  or lesions. Neck:  Supple; no masses Lungs:  Clear throughout to auscultation.   No wheezes, crackles, or rhonchi.  Heart:  Regular rate and rhythm;  no lower  extremity edema Abdomen:  Soft, non-distended, nontender, BS active, no palp mass   Rectal:  Deferred  Msk:  Symmetrical without gross deformities. . Neurologic:  Alert and  oriented x4;  grossly normal neurologically. Skin:  Intact without significant lesions or rashes.   Scheduled inpatient medications  atorvastatin  40 mg Oral QPM   fenofibrate  160 mg Oral q morning   furosemide  20 mg Oral Daily   metoprolol tartrate  50 mg Oral BID   potassium chloride  7.6 mEq Oral Daily   terazosin  10 mg Oral QPM      Intake/Output from previous day: 08/29 0701 - 08/30 0700 In: 736 [I.V.:450; Blood:286] Out: 400 [Urine:400] Intake/Output this shift: No intake/output data recorded.   Lab Results: Recent Labs    04/13/21 1132 04/14/21 0523  WBC 4.2 4.4  HGB 7.2* 7.8*  HCT 23.5* 25.4*  PLT 251 239   BMET Recent Labs    04/13/21 1132 04/14/21 0523  NA 138 143  K 4.1 3.8  CL 105 112*  CO2 23 25  GLUCOSE 126* 96  BUN 37* 34*  CREATININE 1.37* 1.02*  CALCIUM 8.5* 8.7*   LFT No results for input(s): PROT, ALBUMIN, AST, ALT, ALKPHOS, BILITOT, BILIDIR, IBILI in the last 72 hours. PT/INR No results for input(s): LABPROT, INR in the last 72 hours. Hepatitis Panel No results for input(s): HEPBSAG, HCVAB, HEPAIGM, HEPBIGM in the last 72 hours.   . CBC Latest Ref Rng & Units 04/14/2021 04/13/2021 01/29/2021  WBC 4.0 - 10.5 K/uL 4.4 4.2 6.6  Hemoglobin 12.0 - 15.0 g/dL 7.8(L) 7.2(L)  10.7(L)  Hematocrit 36.0 - 46.0 % 25.4(L) 23.5(L) 34.2(L)  Platelets 150 - 400 K/uL 239 251 345    . CMP Latest Ref Rng & Units 04/14/2021 04/13/2021 04/01/2021  Glucose 70 - 99 mg/dL 96 126(H) 120(H)  BUN 8 - 23 mg/dL 34(H) 37(H) 49(H)  Creatinine 0.44 - 1.00 mg/dL 1.02(H) 1.37(H) 1.39(H)  Sodium 135 - 145 mmol/L 143 138 139  Potassium 3.5 - 5.1 mmol/L 3.8 4.1 5.0  Chloride 98 - 111 mmol/L 112(H) 105 100  CO2 22 - 32 mmol/L 25 23 26   Calcium 8.9 - 10.3 mg/dL 8.7(L) 8.5(L) 9.4  Total Protein 6.0 - 8.3 g/dL - - -  Total Bilirubin 0.2 - 1.2 mg/dL - - -  Alkaline Phos 39 - 117 U/L - - -  AST 0 - 37 U/L - - -  ALT 0 - 35 U/L - - -   Studies/Results: No results found.  Principal Problem:   GI bleed Active Problems:   Essential hypertension   Other persistent atrial fibrillation (HCC)   COPD (chronic obstructive pulmonary disease) (HCC)   Symptomatic anemia   Prolonged QT interval   CKD (chronic kidney disease), stage III ([place])    Tye Savoy, NP-C @  04/14/2021, 9:06 AM  I have reviewed the entire case in detail with the above APP and discussed the plan in detail.  Therefore, I agree with the diagnoses recorded above. In addition,  I have personally interviewed and examined the patient.  My additional thoughts are as follows:  Elderly patient admitted with generalized fatigue and arthritic symptoms, found to have marked normocytic anemia with normal iron levels and markedly decreased B12 level.  She was also found to be heme positive, significance of which and contribution to anemia is as yet unclear. She reports a good appetite, denies dysphagia nausea or vomiting.  Stable bowel habits and no visible  rectal bleeding reported by patient.  She needs B12 replacement (1000 mcg weekly for a month, then probably monthly thereafter depending on level.)  Patient reports recent "dark stool", so for possibility of upper GI source of blood loss in a patient on aspirin and Eliquis with  heme positive stool, plan is for upper endoscopy tomorrow.  She was agreeable after discussion of procedure and risks.  The benefits and risks of the planned procedure were described in detail with the patient or (when appropriate) their health care proxy.  Risks were outlined as including, but not limited to, bleeding, infection, perforation, adverse medication reaction leading to cardiac or pulmonary decompensation, pancreatitis (if ERCP).  The limitation of incomplete mucosal visualization was also discussed.  No guarantees or warranties were given.  Patient at increased risk for cardiopulmonary complications of procedure due to medical comorbidities.  Patient can eat today, then n.p.o. after midnight.  Per patient's request, son Louie Casa will be updated by phone.  Nelida Meuse III Office:[phone]

## 2021-04-15 ENCOUNTER — Inpatient Hospital Stay (HOSPITAL_COMMUNITY): Payer: Medicare Other | Admitting: Certified Registered Nurse Anesthetist

## 2021-04-15 ENCOUNTER — Encounter (HOSPITAL_COMMUNITY): Payer: Self-pay | Admitting: Internal Medicine

## 2021-04-15 ENCOUNTER — Encounter (HOSPITAL_COMMUNITY)
Admission: EM | Disposition: A | Discharge status: Home Health | Payer: Self-pay | Source: Home / Self Care | Attending: Family Medicine

## 2021-04-15 DIAGNOSIS — D649 Anemia, unspecified: Secondary | ICD-10-CM | POA: Diagnosis not present

## 2021-04-15 DIAGNOSIS — I10 Essential (primary) hypertension: Secondary | ICD-10-CM | POA: Diagnosis not present

## 2021-04-15 DIAGNOSIS — J418 Mixed simple and mucopurulent chronic bronchitis: Secondary | ICD-10-CM | POA: Diagnosis not present

## 2021-04-15 DIAGNOSIS — I4819 Other persistent atrial fibrillation: Secondary | ICD-10-CM

## 2021-04-15 DIAGNOSIS — N1831 Chronic kidney disease, stage 3a: Secondary | ICD-10-CM | POA: Diagnosis not present

## 2021-04-15 HISTORY — PX: ESOPHAGOGASTRODUODENOSCOPY (EGD) WITH PROPOFOL: SHX5813

## 2021-04-15 LAB — CBC
HCT: 26 % — ABNORMAL LOW (ref 36.0–46.0)
Hemoglobin: 8.1 g/dL — ABNORMAL LOW (ref 12.0–15.0)
MCH: 30.2 pg (ref 26.0–34.0)
MCHC: 31.2 g/dL (ref 30.0–36.0)
MCV: 97 fL (ref 80.0–100.0)
Platelets: 240 10*3/uL (ref 150–400)
RBC: 2.68 MIL/uL — ABNORMAL LOW (ref 3.87–5.11)
RDW: 21.7 % — ABNORMAL HIGH (ref 11.5–15.5)
WBC: 4.5 10*3/uL (ref 4.0–10.5)
nRBC: 0 % (ref 0.0–0.2)

## 2021-04-15 LAB — COMPREHENSIVE METABOLIC PANEL
ALT: 15 U/L (ref 0–44)
AST: 22 U/L (ref 15–41)
Albumin: 3.2 g/dL — ABNORMAL LOW (ref 3.5–5.0)
Alkaline Phosphatase: 28 U/L — ABNORMAL LOW (ref 38–126)
Anion gap: 6 (ref 5–15)
BUN: 21 mg/dL (ref 8–23)
CO2: 26 mmol/L (ref 22–32)
Calcium: 8.8 mg/dL — ABNORMAL LOW (ref 8.9–10.3)
Chloride: 110 mmol/L (ref 98–111)
Creatinine, Ser: 0.82 mg/dL (ref 0.44–1.00)
GFR, Estimated: >60 mL/min (ref >60)
Glucose, Bld: 129 mg/dL — ABNORMAL HIGH (ref 70–99)
Potassium: 4 mmol/L (ref 3.5–5.1)
Sodium: 142 mmol/L (ref 135–145)
Total Bilirubin: 0.7 mg/dL (ref 0.3–1.2)
Total Protein: 5.5 g/dL — ABNORMAL LOW (ref 6.5–8.1)

## 2021-04-15 SURGERY — ESOPHAGOGASTRODUODENOSCOPY (EGD) WITH PROPOFOL
Anesthesia: Monitor Anesthesia Care

## 2021-04-15 MED ORDER — PROPOFOL 500 MG/50ML IV EMUL
INTRAVENOUS | Status: DC | PRN
  Administered 2021-04-15: 75 ug/kg/min via INTRAVENOUS

## 2021-04-15 MED ORDER — LIDOCAINE 2% (20 MG/ML) 5 ML SYRINGE
INTRAMUSCULAR | Status: DC | PRN
  Administered 2021-04-15: 40 mg via INTRAVENOUS

## 2021-04-15 MED ORDER — LACTATED RINGERS IV SOLN
INTRAVENOUS | Status: AC | PRN
  Administered 2021-04-15: 10 mL/h via INTRAVENOUS

## 2021-04-15 MED ORDER — PROPOFOL 10 MG/ML IV BOLUS
INTRAVENOUS | Status: DC | PRN
  Administered 2021-04-15 (×5): 10 mg via INTRAVENOUS

## 2021-04-15 MED ORDER — PROPOFOL 1000 MG/100ML IV EMUL
INTRAVENOUS | Status: AC
  Filled 2021-04-15: qty 100

## 2021-04-15 MED ORDER — PEG-KCL-NACL-NASULF-NA ASC-C 100 G PO SOLR: 0.5000 | Freq: Once | ORAL | Status: DC

## 2021-04-15 MED ORDER — PEG-KCL-NACL-NASULF-NA ASC-C 100 G PO SOLR
0.5000 | Freq: Once | ORAL | Status: AC
  Administered 2021-04-15: 100 g via ORAL
  Filled 2021-04-15: qty 1

## 2021-04-15 MED ORDER — PEG-KCL-NACL-NASULF-NA ASC-C 100 G PO SOLR
0.5000 | Freq: Once | ORAL | Status: AC
  Administered 2021-04-16: 100 g via ORAL
  Filled 2021-04-15 (×2): qty 1

## 2021-04-15 SURGICAL SUPPLY — 15 items

## 2021-04-15 NOTE — H&P (View-Only) (Signed)
[facility]  PROGRESS NOTE  Tara Fields OKH:[id number] DOB: April 05, 1928 DOA: 04/13/2021 PCP: [name], NP   Brief HPI:   87 year-old female with history of hypertension, COPD, CAD, hyperlipidemia, s/p TAVR presented with evaluation of generalized weakness and fatigue with shortness of breath on exertion.  She was found to have hemoglobin of 7.3 down from 10.72 months ago.    Subjective   Patient seen and examined, denies abdominal pain.  Plan for EGD today.   Assessment/Plan:     Acute blood loss anemia -FOBT was positive -Eliquis is currently on hold -Patient was transfused 1 unit of PRBC; hemoglobin is stable at 8.1 -Plan for EGD today.  Hypertension -Continue metoprolol, Hytrin  Persistent atrial fibrillation, chronic -Eliquis on hold as above  CKD stage IIIb -Creatinine stable  COPD -Not in exacerbation continue DuoNeb as needed  Chronic systolic CHF -Recent echocardiogram showed EF of 25 to 30% with global hypokinesis -Continue oral Lasix -Follow BMP in am   Scheduled medications:    atorvastatin  40 mg Oral QPM   fenofibrate  160 mg Oral q morning   furosemide  20 mg Oral Daily   metoprolol tartrate  50 mg Oral BID   [START ON 04/16/2021] peg 3350 powder  0.5 kit Oral Once   potassium chloride  7.5 mEq Oral Daily   terazosin  10 mg Oral QPM         Data Reviewed:   CBG:  Recent Labs  Lab 04/13/21 1856  GLUCAP 147*    SpO2: 96 % O2 Flow Rate (L/min): 6 L/min    Vitals:   04/15/21 1442 04/15/21 1450 04/15/21 1500 04/15/21 1559  BP: 133/61 139/67 131/69 (!) 155/76  Pulse: 94 91 93 79  Resp: 17 17 (!) 21 18  Temp: 97.9 F (36.6 C)   98.2 F (36.8 C)  TempSrc: Oral   Oral  SpO2: 100% 98% 97% 96%  Weight:      Height:         Intake/Output Summary (Last 24 hours) at 04/15/2021 2004 Last data filed at 04/15/2021 1433 Gross per 24 hour  Intake 300 ml  Output --  Net 300 ml    08/30 0701 - 08/31 1900 In: 480  [P.O.:180; I.V.:300] Out: 750 [Urine:750]  Filed Weights   04/13/21 1122 04/15/21 1323  Weight: 67.1 kg 66.7 kg    CBC:  Recent Labs  Lab 04/13/21 1132 04/14/21 0523 04/15/21 0519  WBC 4.2 4.4 4.5  HGB 7.2* 7.8* 8.1*  HCT 23.5* 25.4* 26.0*  PLT 251 239 240  MCV 98.3 99.2 97.0  MCH 30.1 30.5 30.2  MCHC 30.6 30.7 31.2  RDW 21.4* 21.3* 21.7*    Complete metabolic panel:  Recent Labs  Lab 04/13/21 1132 04/14/21 0523 04/15/21 0519  NA 138 143 142  K 4.1 3.8 4.0  CL 105 112* 110  CO2 '23 25 26  ' GLUCOSE 126* 96 129*  BUN 37* 34* 21  CREATININE 1.37* 1.02* 0.82  CALCIUM 8.5* 8.7* 8.8*  AST  --   --  22  ALT  --   --  15  ALKPHOS  --   --  28*  BILITOT  --   --  0.7  ALBUMIN  --   --  3.2*    No results for input(s): LIPASE, AMYLASE in the last 168 hours.  No results for input(s): CRP, DDIMER, BNP, PROCALCITON, SARSCOV2NAA in the last 168 hours.  Invalid input(s): LACTICACID  ------------------------------------------------------------------------------------------------------------------ No results  for input(s): CHOL, HDL, LDLCALC, TRIG, CHOLHDL, LDLDIRECT in the last 72 hours.  Lab Results  Component Value Date   HGBA1C 5.9 (H) 09/02/2020   ------------------------------------------------------------------------------------------------------------------ No results for input(s): TSH, T4TOTAL, T3FREE, THYROIDAB in the last 72 hours.  Invalid input(s): FREET3 ------------------------------------------------------------------------------------------------------------------ Recent Labs    04/13/21 1132 04/13/21 1236  VITAMINB12  --  90*  FOLATE  --  7.5  FERRITIN  --  82  TIBC  --  525*  IRON  --  67  RETICCTPCT 7.8*  --     Coagulation profile No results for input(s): INR, PROTIME in the last 168 hours. No results for input(s): DDIMER in the last 72 hours.  Cardiac Enzymes No results for input(s): CKTOTAL, CKMB, CKMBINDEX, TROPONINI in the last 168  hours.  ------------------------------------------------------------------------------------------------------------------    Component Value Date/Time   BNP 1,896.8 (H) 01/29/2021 0636     Antibiotics: Anti-infectives (From admission, onward)    None        Radiology Reports  No results found.    DVT prophylaxis: SCDs  Code Status: Full code  Family Communication: No family at bedside   Consultants: Gastroenterology  Procedures:     Objective    Physical Examination:   G general-appears in no acute distress Heart-S1-S2, regular, no murmur auscultated Lungs-clear to auscultation bilaterally, no wheezing or crackles auscultated Abdomen-soft, nontender, no organomegaly Extremities-no edema in the lower extremities Neuro-alert, oriented x3, no focal deficit noted   Status is: Inpatient  Dispo: The patient is from: Home              Anticipated d/c is to: Home              Anticipated d/c date is: 04/16/2021              Patient currently not medically stable for discharge  Barrier to discharge-plan for EGD today.  COVID-19 Labs  Recent Labs    04/13/21 1236  FERRITIN 82    Lab Results  Component Value Date   [place] NEGATIVE 01/29/2021   [place] NEGATIVE 01/10/2021   [place] NEGATIVE 01/04/2021   [place] NEGATIVE 12/18/2020    Microbiology  No results found for this or any previous visit (from the past 240 hour(s)).  Pressure Injury 04/14/21 Sacrum Posterior;Upper Stage 2 -  Partial thickness loss of dermis presenting as a shallow open injury with a red, pink wound bed without slough. (Active)  04/14/21 1000  Location: Sacrum  Location Orientation: Posterior;Upper  Staging: Stage 2 -  Partial thickness loss of dermis presenting as a shallow open injury with a red, pink wound bed without slough.  Wound Description (Comments):   Present on Admission: Yes      [place]   [facility] If 7PM-7AM, please  contact night-coverage at [email or web address], Office  [phone]   04/15/2021, 8:04 PM  LOS: 2 days

## 2021-04-15 NOTE — Interval H&P Note (Signed)
History and Physical Interval Note:  04/15/2021 2:19 PM  Tara Fields  has presented today for surgery, with the diagnosis of anemia, heme positive.  The various methods of treatment have been discussed with the patient and family. After consideration of risks, benefits and other options for treatment, the patient has consented to  Procedure(s): ESOPHAGOGASTRODUODENOSCOPY (EGD) WITH PROPOFOL (N/A) as a surgical intervention.  The patient's history has been reviewed, patient examined, no change in status, stable for surgery.  I have reviewed the patient's chart and labs.  Questions were answered to the patient's satisfaction.    Hemoglobin 8.1 today.   Nelida Meuse III

## 2021-04-15 NOTE — Op Note (Signed)
[facility] Patient Name: Tara Fields Procedure Date: 04/15/2021 MRN: [id number] Attending MD: Estill Cotta. Loletha Carrow , MD Date of Birth: December 09, 1927 CSN: [id number] Age: 87 Admit Type: Inpatient Procedure:                Upper GI endoscopy Indications:              Heme positive stool                           Normocytic anemia, normal iron levels, markedly                            decreased B12 Providers:                Mallie Mussel L. Loletha Carrow, MD, Jeanella Cara, RN, Tyna Jaksch Technician Referring MD:             [facility] Medicines:                Monitored Anesthesia Care Complications:            No immediate complications. Estimated Blood Loss:     Estimated blood loss: none. Procedure:                Pre-Anesthesia Assessment:                           - Prior to the procedure, a History and Physical                            was performed, and patient medications and                            allergies were reviewed. The patient's tolerance of                            previous anesthesia was also reviewed. The risks                            and benefits of the procedure and the sedation                            options and risks were discussed with the patient.                            All questions were answered, and informed consent                            was obtained. Prior Anticoagulants: The patient has                            taken Xarelto (rivaroxaban), last dose was 2 days                            prior to procedure. ASA  Grade Assessment: III - A                            patient with severe systemic disease. After                            reviewing the risks and benefits, the patient was                            deemed in satisfactory condition to undergo the                            procedure.                           After obtaining informed consent, the endoscope was                             passed under direct vision. Throughout the                            procedure, the patient's blood pressure, pulse, and                            oxygen saturations were monitored continuously. The                            [id number]-H190 ([id number]) Olympus endoscope was introduced                            through the mouth, and advanced to the second part                            of duodenum. The upper GI endoscopy was                            accomplished without difficulty. The patient                            tolerated the procedure well. Scope In: Scope Out: Findings:      The esophagus was normal.      A few diminutive erosions with no bleeding were found in gastric antrum.       There were a few flecks of heme.      The exam of the stomach was otherwise normal.      The cardia and gastric fundus were normal on retroflexion.      The examined duodenum was normal. Impression:               - Normal esophagus.                           - Erosive gastropathy with no bleeding and no                            stigmata of recent bleeding.                           -  Normal examined duodenum.                           - No specimens collected.                           These findings may explain heme positive stool. The                            anemia appears more likely on the basis of B12                            deficiency than occult GI blood loss. Moderate Sedation:      MAC sedation used Recommendation:           - Return patient to hospital ward for ongoing care.                           - Clear liquid diet today.                           - Perform a colonoscopy tomorrow. Procedure Code(s):        --- Professional ---                           [phone], Esophagogastroduodenoscopy, flexible,                            transoral; diagnostic, including collection of                            specimen(s) by brushing or washing, when performed                             (separate procedure) Diagnosis Code(s):        --- Professional ---                           K31.89, Other diseases of stomach and duodenum                           R19.5, Other fecal abnormalities CPT copyright 2019 [employer]. All rights reserved. The codes documented in this report are preliminary and upon coder review may  be revised to meet current compliance requirements. Ilisa Hayworth L. Loletha Carrow, MD 04/15/2021 2:43:16 PM This report has been signed electronically. Number of Addenda: 0

## 2021-04-15 NOTE — Progress Notes (Addendum)
Mobility Specialist - Progress Note   04/15/21 1140  Oxygen Therapy  SpO2 (!) 83 %  O2 Device Room Air  Mobility  Activity Ambulated in room  Level of Assistance Contact guard assist, steadying assist  Assistive Device Front wheel walker  Distance Ambulated (ft) 80 ft  Mobility Ambulated with assistance in room  Mobility Response Tolerated well  Mobility performed by Mobility specialist  $Mobility charge 1 Mobility      Pre-mobility:  94% SpO2 During mobility: 98 HR, 83-85%SpO2 Post-mobility: 84 HR, 91-94% SPO2  Upon entry pt stated she has a difficult time breathing when walking, but was agreeable to ambulate. Pt used RW to ambulate ~80 ft in room and reported SOB during session. O2 levels began to desat in beginning of session and stayed in lower 80's on room air. After ambulation, pt was returned to recliner and encouraged to practice pursed breathing. Pt was left in recliner with call bell at side. RN informed of session and was encouraged to get PT involved.   [place] Specialist Acute Rehabilitation Services Phone: [phone] 04/15/21, 11:44 AM

## 2021-04-15 NOTE — Progress Notes (Signed)
[facility]  PROGRESS NOTE  Tara Fields MPN:[id number] DOB: Nov 11, 1927 DOA: 04/13/2021 PCP: [name], NP   Brief HPI:   87 year-old female with history of hypertension, COPD, CAD, hyperlipidemia, s/p TAVR presented with evaluation of generalized weakness and fatigue with shortness of breath on exertion.  She was found to have hemoglobin of 7.3 down from 10.72 months ago.    Subjective   Patient seen and examined, denies abdominal pain.  Plan for EGD today.   Assessment/Plan:     Acute blood loss anemia -FOBT was positive -Eliquis is currently on hold -Patient was transfused 1 unit of PRBC; hemoglobin is stable at 8.1 -Plan for EGD today.  Hypertension -Continue metoprolol, Hytrin  Persistent atrial fibrillation, chronic -Eliquis on hold as above  CKD stage IIIb -Creatinine stable  COPD -Not in exacerbation continue DuoNeb as needed  Chronic systolic CHF -Recent echocardiogram showed EF of 25 to 30% with global hypokinesis -Continue oral Lasix -Follow BMP in am   Scheduled medications:    atorvastatin  40 mg Oral QPM   fenofibrate  160 mg Oral q morning   furosemide  20 mg Oral Daily   metoprolol tartrate  50 mg Oral BID   [START ON 04/16/2021] peg 3350 powder  0.5 kit Oral Once   potassium chloride  7.5 mEq Oral Daily   terazosin  10 mg Oral QPM         Data Reviewed:   CBG:  Recent Labs  Lab 04/13/21 1856  GLUCAP 147*    SpO2: 96 % O2 Flow Rate (L/min): 6 L/min    Vitals:   04/15/21 1442 04/15/21 1450 04/15/21 1500 04/15/21 1559  BP: 133/61 139/67 131/69 (!) 155/76  Pulse: 94 91 93 79  Resp: 17 17 (!) 21 18  Temp: 97.9 F (36.6 C)   98.2 F (36.8 C)  TempSrc: Oral   Oral  SpO2: 100% 98% 97% 96%  Weight:      Height:         Intake/Output Summary (Last 24 hours) at 04/15/2021 2004 Last data filed at 04/15/2021 1433 Gross per 24 hour  Intake 300 ml  Output --  Net 300 ml    08/30 0701 - 08/31 1900 In: 480  [P.O.:180; I.V.:300] Out: 750 [Urine:750]  Filed Weights   04/13/21 1122 04/15/21 1323  Weight: 67.1 kg 66.7 kg    CBC:  Recent Labs  Lab 04/13/21 1132 04/14/21 0523 04/15/21 0519  WBC 4.2 4.4 4.5  HGB 7.2* 7.8* 8.1*  HCT 23.5* 25.4* 26.0*  PLT 251 239 240  MCV 98.3 99.2 97.0  MCH 30.1 30.5 30.2  MCHC 30.6 30.7 31.2  RDW 21.4* 21.3* 21.7*    Complete metabolic panel:  Recent Labs  Lab 04/13/21 1132 04/14/21 0523 04/15/21 0519  NA 138 143 142  K 4.1 3.8 4.0  CL 105 112* 110  CO2 '23 25 26  ' GLUCOSE 126* 96 129*  BUN 37* 34* 21  CREATININE 1.37* 1.02* 0.82  CALCIUM 8.5* 8.7* 8.8*  AST  --   --  22  ALT  --   --  15  ALKPHOS  --   --  28*  BILITOT  --   --  0.7  ALBUMIN  --   --  3.2*    No results for input(s): LIPASE, AMYLASE in the last 168 hours.  No results for input(s): CRP, DDIMER, BNP, PROCALCITON, SARSCOV2NAA in the last 168 hours.  Invalid input(s): LACTICACID  ------------------------------------------------------------------------------------------------------------------ No results  for input(s): CHOL, HDL, LDLCALC, TRIG, CHOLHDL, LDLDIRECT in the last 72 hours.  Lab Results  Component Value Date   HGBA1C 5.9 (H) 09/02/2020   ------------------------------------------------------------------------------------------------------------------ No results for input(s): TSH, T4TOTAL, T3FREE, THYROIDAB in the last 72 hours.  Invalid input(s): FREET3 ------------------------------------------------------------------------------------------------------------------ Recent Labs    04/13/21 1132 04/13/21 1236  VITAMINB12  --  90*  FOLATE  --  7.5  FERRITIN  --  82  TIBC  --  525*  IRON  --  67  RETICCTPCT 7.8*  --     Coagulation profile No results for input(s): INR, PROTIME in the last 168 hours. No results for input(s): DDIMER in the last 72 hours.  Cardiac Enzymes No results for input(s): CKTOTAL, CKMB, CKMBINDEX, TROPONINI in the last 168  hours.  ------------------------------------------------------------------------------------------------------------------    Component Value Date/Time   BNP 1,896.8 (H) 01/29/2021 0636     Antibiotics: Anti-infectives (From admission, onward)    None        Radiology Reports  No results found.    DVT prophylaxis: SCDs  Code Status: Full code  Family Communication: No family at bedside   Consultants: Gastroenterology  Procedures:     Objective    Physical Examination:   G general-appears in no acute distress Heart-S1-S2, regular, no murmur auscultated Lungs-clear to auscultation bilaterally, no wheezing or crackles auscultated Abdomen-soft, nontender, no organomegaly Extremities-no edema in the lower extremities Neuro-alert, oriented x3, no focal deficit noted   Status is: Inpatient  Dispo: The patient is from: Home              Anticipated d/c is to: Home              Anticipated d/c date is: 04/16/2021              Patient currently not medically stable for discharge  Barrier to discharge-plan for EGD today.  COVID-19 Labs  Recent Labs    04/13/21 1236  FERRITIN 82    Lab Results  Component Value Date   [place] NEGATIVE 01/29/2021   [place] NEGATIVE 01/10/2021   [place] NEGATIVE 01/04/2021   [place] NEGATIVE 12/18/2020    Microbiology  No results found for this or any previous visit (from the past 240 hour(s)).  Pressure Injury 04/14/21 Sacrum Posterior;Upper Stage 2 -  Partial thickness loss of dermis presenting as a shallow open injury with a red, pink wound bed without slough. (Active)  04/14/21 1000  Location: Sacrum  Location Orientation: Posterior;Upper  Staging: Stage 2 -  Partial thickness loss of dermis presenting as a shallow open injury with a red, pink wound bed without slough.  Wound Description (Comments):   Present on Admission: Yes      [place]   [facility] If 7PM-7AM, please  contact night-coverage at [email or web address], Office  [phone]   04/15/2021, 8:04 PM  LOS: 2 days

## 2021-04-15 NOTE — Anesthesia Preprocedure Evaluation (Addendum)
Anesthesia Evaluation  Patient identified by MRN, date of birth, ID band Patient awake    Reviewed: Allergy & Precautions, NPO status , Patient's Chart, lab work & pertinent test results, reviewed documented beta blocker date and time   Airway Mallampati: II  TM Distance: >3 FB Neck ROM: Full    Dental  (+) Edentulous Upper, Edentulous Lower, Dental Advisory Given   Pulmonary COPD, former smoker,    Pulmonary exam normal breath sounds clear to auscultation       Cardiovascular hypertension, Pt. on medications and Pt. on home beta blockers + CAD and +CHF   Rhythm:Irregular Rate:Tachycardia  Echo 12/2020 1. There is prominent septal-lateral left ventricular dyssynchrony due to LBBB. Left ventricular ejection fraction, by estimation, is 25 to 30%. The left ventricle has severely decreased function. The left ventricle demonstrates global hypokinesis. The left ventricular internal cavity size was mildly dilated. Left ventricular diastolic function could not be evaluated.  2. Right ventricular systolic function is normal. The right ventricular size is normal. There is normal pulmonary artery systolic pressure.  3. Left atrial size was severely dilated.  4. The mitral valve is normal in structure. Mild mitral valve  regurgitation. No evidence of mitral stenosis.  5. Mild perivalvular leak. The aortic valve has been repaired/replaced. Aortic valve regurgitation is mild. There is a 26 mm Sapien prosthetic (TAVR) valve present in the aortic position. Procedure Date: 03/07/2019 at  [facility]. Aortic regurgitation PHT measures 401 msec. Aortic valve mean gradient measures 12.2 mmHg. Aortic valve Vmax measures 2.46 m/s. Aortic valve acceleration time measures 76 msec.  6. The inferior vena cava is normal in size with greater than 50% respiratory variability, suggesting right atrial pressure of 3 mmHg.    Neuro/Psych negative neurological  ROS     GI/Hepatic Neg liver ROS, GERD  ,  Endo/Other  negative endocrine ROSdiabetes  Renal/GU Renal disease     Musculoskeletal  (+) Arthritis ,   Abdominal   Peds  Hematology  (+) Blood dyscrasia, anemia ,   Anesthesia Other Findings   Reproductive/Obstetrics                           Anesthesia Physical Anesthesia Plan  ASA: 4  Anesthesia Plan: MAC   Post-op Pain Management:    Induction: Intravenous  PONV Risk Score and Plan: 2 and Propofol infusion, Treatment may vary due to age or medical condition and Ondansetron  Airway Management Planned: Natural Airway  Additional Equipment:   Intra-op Plan:   Post-operative Plan:   Informed Consent: I have reviewed the patients History and Physical, chart, labs and discussed the procedure including the risks, benefits and alternatives for the proposed anesthesia with the patient or authorized representative who has indicated his/her understanding and acceptance.     Dental advisory given  Plan Discussed with: CRNA  Anesthesia Plan Comments:         Anesthesia Quick Evaluation

## 2021-04-15 NOTE — Transfer of Care (Signed)
Immediate Anesthesia Transfer of Care Note  Patient: Tara Fields  Procedure(s) Performed: ESOPHAGOGASTRODUODENOSCOPY (EGD) WITH PROPOFOL  Patient Location: Endoscopy Unit  Anesthesia Type:MAC  Level of Consciousness: drowsy and patient cooperative  Airway & Oxygen Therapy: Patient Spontanous Breathing and Patient connected to face mask oxygen  Post-op Assessment: Report given to RN and Post -op Vital signs reviewed and stable  Post vital signs: Reviewed and stable  Last Vitals:  Vitals Value Taken Time  BP 133/61 04/15/21 1441  Temp    Pulse 102 04/15/21 1443  Resp 23 04/15/21 1443  SpO2 100 % 04/15/21 1443  Vitals shown include unvalidated device data.  Last Pain:  Vitals:   04/15/21 1323  TempSrc: Oral  PainSc: 0-No pain         Complications: No notable events documented.

## 2021-04-16 ENCOUNTER — Inpatient Hospital Stay (HOSPITAL_COMMUNITY): Payer: Medicare Other | Admitting: Certified Registered Nurse Anesthetist

## 2021-04-16 ENCOUNTER — Encounter (HOSPITAL_COMMUNITY)
Admission: EM | Disposition: A | Discharge status: Home Health | Payer: Self-pay | Source: Home / Self Care | Attending: Family Medicine

## 2021-04-16 ENCOUNTER — Other Ambulatory Visit: Payer: Medicare Other

## 2021-04-16 ENCOUNTER — Encounter (HOSPITAL_COMMUNITY): Payer: Self-pay | Admitting: Internal Medicine

## 2021-04-16 DIAGNOSIS — R9431 Abnormal electrocardiogram [ECG] [EKG]: Secondary | ICD-10-CM | POA: Diagnosis not present

## 2021-04-16 DIAGNOSIS — D509 Iron deficiency anemia, unspecified: Secondary | ICD-10-CM | POA: Diagnosis not present

## 2021-04-16 DIAGNOSIS — D649 Anemia, unspecified: Secondary | ICD-10-CM | POA: Diagnosis not present

## 2021-04-16 DIAGNOSIS — K922 Gastrointestinal hemorrhage, unspecified: Secondary | ICD-10-CM | POA: Diagnosis not present

## 2021-04-16 DIAGNOSIS — I10 Essential (primary) hypertension: Secondary | ICD-10-CM | POA: Diagnosis not present

## 2021-04-16 HISTORY — PX: COLONOSCOPY WITH PROPOFOL: SHX5780

## 2021-04-16 LAB — CBC
HCT: 29 % — ABNORMAL LOW (ref 36.0–46.0)
Hemoglobin: 9 g/dL — ABNORMAL LOW (ref 12.0–15.0)
MCH: 30.7 pg (ref 26.0–34.0)
MCHC: 31 g/dL (ref 30.0–36.0)
MCV: 99 fL (ref 80.0–100.0)
Platelets: 300 10*3/uL (ref 150–400)
RBC: 2.93 MIL/uL — ABNORMAL LOW (ref 3.87–5.11)
RDW: 20.9 % — ABNORMAL HIGH (ref 11.5–15.5)
WBC: 5.6 10*3/uL (ref 4.0–10.5)
nRBC: 0 % (ref 0.0–0.2)

## 2021-04-16 LAB — BASIC METABOLIC PANEL
Anion gap: 9 (ref 5–15)
BUN: 17 mg/dL (ref 8–23)
CO2: 20 mmol/L — ABNORMAL LOW (ref 22–32)
Calcium: 9.1 mg/dL (ref 8.9–10.3)
Chloride: 115 mmol/L — ABNORMAL HIGH (ref 98–111)
Creatinine, Ser: 0.86 mg/dL (ref 0.44–1.00)
GFR, Estimated: >60 mL/min (ref >60)
Glucose, Bld: 139 mg/dL — ABNORMAL HIGH (ref 70–99)
Potassium: 3.3 mmol/L — ABNORMAL LOW (ref 3.5–5.1)
Sodium: 144 mmol/L (ref 135–145)

## 2021-04-16 LAB — GLUCOSE, CAPILLARY: Glucose-Capillary: 120 mg/dL — ABNORMAL HIGH (ref 70–99)

## 2021-04-16 SURGERY — COLONOSCOPY WITH PROPOFOL
Anesthesia: Monitor Anesthesia Care

## 2021-04-16 MED ORDER — PROPOFOL 10 MG/ML IV BOLUS
INTRAVENOUS | Status: DC | PRN
  Administered 2021-04-16: 15 mg via INTRAVENOUS
  Administered 2021-04-16: 10 mg via INTRAVENOUS

## 2021-04-16 MED ORDER — PROPOFOL 1000 MG/100ML IV EMUL
INTRAVENOUS | Status: AC
  Filled 2021-04-16: qty 100

## 2021-04-16 MED ORDER — APIXABAN 5 MG PO TABS
5.0000 mg | ORAL_TABLET | Freq: Two times a day (BID) | ORAL | Status: DC
  Administered 2021-04-16: 5 mg via ORAL
  Filled 2021-04-16: qty 1

## 2021-04-16 MED ORDER — PROPOFOL 500 MG/50ML IV EMUL
INTRAVENOUS | Status: DC | PRN
  Administered 2021-04-16: 80 ug/kg/min via INTRAVENOUS

## 2021-04-16 MED ORDER — CYANOCOBALAMIN 1000 MCG/ML IJ SOLN
1000.0000 ug | Freq: Once | INTRAMUSCULAR | Status: AC
  Administered 2021-04-16: 1000 ug via INTRAMUSCULAR
  Filled 2021-04-16: qty 1

## 2021-04-16 MED ORDER — VITAMIN B-12 1000 MCG PO TABS: 1000.0000 ug | ORAL_TABLET | Freq: Every day | ORAL | 3 refills | Status: DC

## 2021-04-16 MED ORDER — PROPOFOL 500 MG/50ML IV EMUL
INTRAVENOUS | Status: AC
  Filled 2021-04-16: qty 50

## 2021-04-16 MED ORDER — POTASSIUM CHLORIDE CRYS ER 10 MEQ PO TBCR
40.0000 meq | EXTENDED_RELEASE_TABLET | Freq: Once | ORAL | Status: AC
  Administered 2021-04-16: 40 meq via ORAL
  Filled 2021-04-16: qty 4

## 2021-04-16 SURGICAL SUPPLY — 22 items

## 2021-04-16 NOTE — TOC Transition Note (Signed)
Transition of Care [facility]) - CM/SW Discharge Note   Patient Details  Name: Delane Stalling MRN: [id number] Date of Birth: 06-Aug-1928  [facility] CM/SW Contact:  Trish Mage, LCSW Phone Number: 04/16/2021, 2:21 PM   Clinical Narrative:   Patient who is stable for discharge is returning to her independent living home at [facility].  Resume HH PT/RN through [facility]. Son will give her a ride home.  No further needs identified.  TOC sign off.    Final next level of care: [place] Barriers to Discharge: No Barriers Identified   Patient Goals and CMS Choice        Discharge Placement                       Discharge Plan and Services                                     Social Determinants of Health (SDOH) Interventions     Readmission Risk Interventions No flowsheet data found.

## 2021-04-16 NOTE — Evaluation (Signed)
Physical Therapy Evaluation Patient Details Name: Tara Fields MRN: [id number] DOB: [birth year] Today's Date: 04/16/2021   History of Present Illness  87 y.o. female admitted 04/13/21 for evaluation of generalized weakness and fatigue with shortness of breath with exertion. Dx of GIB. Pt with medical history significant for HTN, COPD, CAD, HLD, s/p TAVR.  Clinical Impression  Pt admitted with above diagnosis. Pt ambulated 10' with RW, she took 2 standing rest breaks 2* 3/4 dyspnea, no loss of balance. SaO2 89% on room air 1 minute after ambulating (unable to get SAO2 reading while ambulating). Pt appears to be functioning at a level that is safe for her to return to her ALF where she does not need to ambulate long distances. HHPT recommended as pt is below her baseline in terms of activity tolerance/endurance.  Pt currently with functional limitations due to the deficits listed below (see PT Problem List). Pt will benefit from skilled PT to increase their independence and safety with mobility to allow discharge to the venue listed below.       Follow Up Recommendations Home health PT    Equipment Recommendations  None recommended by PT    Recommendations for Other Services       Precautions / Restrictions Precautions Precautions: Fall;Other (comment) Precaution Comments: monitor O2 Restrictions Weight Bearing Restrictions: No      Mobility  Bed Mobility               General bed mobility comments: up on edge of bed    Transfers Overall transfer level: Needs assistance Equipment used: Rolling walker (2 wheeled) Transfers: Sit to/from Stand Sit to Stand: Modified independent (Device/Increase time)         General transfer comment: no assist needed, good hand placement  Ambulation/Gait Ambulation/Gait assistance: Supervision Gait Distance (Feet): 80 Feet Assistive device: Rolling walker (2 wheeled) Gait Pattern/deviations: Step-through pattern;Decreased stride  length;Trunk flexed Gait velocity: decr   General Gait Details: 2 standing rest breaks 2* 3/4 dyspnea, no loss of balance, distance limited by dyspnea. SaO2 89% on room air 1 minute after ambulation (pulse ox didn't read while walking).  Stairs            Wheelchair Mobility    Modified Rankin (Stroke Patients Only)       Balance Overall balance assessment: Modified Independent                                           Pertinent Vitals/Pain Pain Assessment: No/denies pain    Home Living Family/patient expects to be discharged to:: Assisted living               Home Equipment: Walker - 4 wheels;Cane - single point;Shower seat;Hand held shower head;Grab bars - tub/shower;Grab bars - toilet      Prior Function Level of Independence: Independent with assistive device(s)         Comments: Pt utilizes rollator to perform ADLs, including laundry. Was walking to dining room PTA. Denies falls in past 1 year.     Hand Dominance   Dominant Hand: Right    Extremity/Trunk Assessment   Upper Extremity Assessment Upper Extremity Assessment: Overall WFL for tasks assessed    Lower Extremity Assessment Lower Extremity Assessment: Overall WFL for tasks assessed    Cervical / Trunk Assessment Cervical / Trunk Assessment: Normal  Communication   Communication: No difficulties  Cognition  Arousal/Alertness: Awake/alert Behavior During Therapy: WFL for tasks assessed/performed Overall Cognitive Status: Within Functional Limits for tasks assessed                                        General Comments      Exercises     Assessment/Plan    PT Assessment Patient needs continued PT services  PT Problem List Decreased activity tolerance;Cardiopulmonary status limiting activity;Decreased mobility       PT Treatment Interventions Therapeutic activities;Therapeutic exercise;Gait training;Functional mobility training;Balance  training    PT Goals (Current goals can be found in the Care Plan section)  Acute Rehab PT Goals Patient Stated Goal: return to ALF, get my energy back PT Goal Formulation: With patient Time For Goal Achievement: 04/30/21 Potential to Achieve Goals: Good    Frequency Min 3X/week   Barriers to discharge        Co-evaluation               AM-PAC PT "6 Clicks" Mobility  Outcome Measure Help needed turning from your back to your side while in a flat bed without using bedrails?: None Help needed moving from lying on your back to sitting on the side of a flat bed without using bedrails?: None Help needed moving to and from a bed to a chair (including a wheelchair)?: None Help needed standing up from a chair using your arms (e.g., wheelchair or bedside chair)?: None Help needed to walk in hospital room?: None Help needed climbing 3-5 steps with a railing? : A Little 6 Click Score: 23    End of Session Equipment Utilized During Treatment: Gait belt Activity Tolerance: Patient limited by fatigue Patient left: in chair;with call bell/phone within reach;with chair alarm set Nurse Communication: Mobility status PT Visit Diagnosis: Difficulty in walking, not elsewhere classified (R26.2)    Time: 9629-5284 PT Time Calculation (min) (ACUTE ONLY): 16 min   Charges:   PT Evaluation $PT Eval Moderate Complexity: 1 Mod         Philomena Doheny PT 04/16/2021  Acute Rehabilitation Services Pager [phone] Office [phone]

## 2021-04-16 NOTE — Anesthesia Preprocedure Evaluation (Signed)
Anesthesia Evaluation  Patient identified by MRN, date of birth, ID band Patient awake    Reviewed: Allergy & Precautions, NPO status , Patient's Chart, lab work & pertinent test results  Airway Mallampati: II  TM Distance: >3 FB Neck ROM: Full    Dental no notable dental hx.    Pulmonary COPD, former smoker,    Pulmonary exam normal breath sounds clear to auscultation + decreased breath sounds      Cardiovascular hypertension, + dysrhythmias Atrial Fibrillation  Rhythm:Irregular Rate:Tachycardia  S/P TAVR   Neuro/Psych negative neurological ROS  negative psych ROS   GI/Hepatic negative GI ROS, Neg liver ROS,   Endo/Other  diabetes  Renal/GU negative Renal ROS  negative genitourinary   Musculoskeletal negative musculoskeletal ROS (+)   Abdominal   Peds negative pediatric ROS (+)  Hematology  (+) anemia ,   Anesthesia Other Findings   Reproductive/Obstetrics negative OB ROS                             Anesthesia Physical Anesthesia Plan  ASA: 3  Anesthesia Plan: MAC   Post-op Pain Management:    Induction: Intravenous  PONV Risk Score and Plan: 2 and Propofol infusion and Treatment may vary due to age or medical condition  Airway Management Planned: Simple Face Mask  Additional Equipment:   Intra-op Plan:   Post-operative Plan:   Informed Consent: I have reviewed the patients History and Physical, chart, labs and discussed the procedure including the risks, benefits and alternatives for the proposed anesthesia with the patient or authorized representative who has indicated his/her understanding and acceptance.     Dental advisory given  Plan Discussed with: CRNA and Surgeon  Anesthesia Plan Comments:         Anesthesia Quick Evaluation

## 2021-04-16 NOTE — Anesthesia Postprocedure Evaluation (Signed)
Anesthesia Post Note  Patient: Musician  Procedure(s) Performed: COLONOSCOPY WITH PROPOFOL     Patient location during evaluation: PACU Anesthesia Type: MAC Level of consciousness: awake and alert Pain management: pain level controlled Vital Signs Assessment: post-procedure vital signs reviewed and stable Respiratory status: spontaneous breathing, nonlabored ventilation, respiratory function stable and patient connected to nasal cannula oxygen Cardiovascular status: stable and blood pressure returned to baseline Postop Assessment: no apparent nausea or vomiting Anesthetic complications: no   No notable events documented.  Last Vitals:  Vitals:   04/16/21 0810 04/16/21 0820  BP: (!) 129/55 130/75  Pulse: 85 94  Resp: (!) 26 20  Temp:    SpO2: 95% 100%    Last Pain:  Vitals:   04/16/21 0900  TempSrc:   PainSc: 0-No pain                 Mcihael Hinderman S

## 2021-04-16 NOTE — Plan of Care (Signed)
  Problem: Education: Goal: Ability to identify signs and symptoms of gastrointestinal bleeding will improve Outcome: Adequate for Discharge   Problem: Bowel/Gastric: Goal: Will show no signs and symptoms of gastrointestinal bleeding Outcome: Adequate for Discharge   Problem: Fluid Volume: Goal: Will show no signs and symptoms of excessive bleeding Outcome: Adequate for Discharge   Problem: Clinical Measurements: Goal: Complications related to the disease process, condition or treatment will be avoided or minimized Outcome: Adequate for Discharge   Problem: Education: Goal: Knowledge of General Education information will improve Description: Including pain rating scale, medication(s)/side effects and non-pharmacologic comfort measures Outcome: Adequate for Discharge   Problem: Health Behavior/Discharge Planning: Goal: Ability to manage health-related needs will improve Outcome: Adequate for Discharge   Problem: Clinical Measurements: Goal: Ability to maintain clinical measurements within normal limits will improve Outcome: Adequate for Discharge Goal: Will remain free from infection Outcome: Adequate for Discharge Goal: Diagnostic test results will improve Outcome: Adequate for Discharge Goal: Respiratory complications will improve Outcome: Adequate for Discharge Goal: Cardiovascular complication will be avoided Outcome: Adequate for Discharge   Problem: Activity: Goal: Risk for activity intolerance will decrease Outcome: Adequate for Discharge   Problem: Nutrition: Goal: Adequate nutrition will be maintained Outcome: Adequate for Discharge   Problem: Coping: Goal: Level of anxiety will decrease Outcome: Adequate for Discharge   Problem: Elimination: Goal: Will not experience complications related to bowel motility Outcome: Adequate for Discharge Goal: Will not experience complications related to urinary retention Outcome: Adequate for Discharge   Problem: Pain  Managment: Goal: General experience of comfort will improve Outcome: Adequate for Discharge   Problem: Safety: Goal: Ability to remain free from injury will improve Outcome: Adequate for Discharge   Problem: Skin Integrity: Goal: Risk for impaired skin integrity will decrease Outcome: Adequate for Discharge   

## 2021-04-16 NOTE — Interval H&P Note (Signed)
History and Physical Interval Note:  04/16/2021 7:05 AM  Tara Fields  has presented today for surgery, with the diagnosis of heme positive stool and anemia.  The various methods of treatment have been discussed with the patient and family. After consideration of risks, benefits and other options for treatment, the patient has consented to  Procedure(s): COLONOSCOPY WITH PROPOFOL (N/A) as a surgical intervention.  The patient's history has been reviewed, patient examined, no change in status, stable for surgery.  I have reviewed the patient's chart and labs.  Questions were answered to the patient's satisfaction.     Milus Banister

## 2021-04-16 NOTE — Discharge Summary (Addendum)
Physician Discharge Summary  Tara Fields YWV:[id number] DOB: Mar 22, 1928 DOA: 04/13/2021  PCP: [name], NP  Admit date: 04/13/2021 Discharge date: 04/16/2021  Time spent: 60 minutes  Recommendations for Outpatient Follow-up:  Follow-up PCP in 2 weeks Check B12 level in 6 to 8 weeks  Discharge Diagnoses:  Principal Problem:   GI bleed Active Problems:   Essential hypertension   Other persistent atrial fibrillation (HCC)   COPD (chronic obstructive pulmonary disease) (HCC)   Symptomatic anemia   Prolonged QT interval   CKD (chronic kidney disease), stage III ([place])   Discharge Condition: Stable  Diet recommendation: Heart healthy diet    Filed Weights   04/13/21 1122 04/15/21 1323 04/16/21 0707  Weight: 67.1 kg 66.7 kg 66.7 kg    History of present illness:  87 year-old female with history of hypertension, COPD, CAD, hyperlipidemia, s/p TAVR presented with evaluation of generalized weakness and fatigue with shortness of breath on exertion.  She was found to have hemoglobin of 7.3 down from 10.72 months ago.  Hospital Course:   Acute blood loss anemia -FOBT was positive -Eliquis was initially held -Patient underwent EGD and colonoscopy, EGD was normal except for erosive gastropathy.  Colonoscopy done today shows left-sided diverticulosis with internal and external hemorrhoids.  No further recommendations per GI. -Patient was transfused 1 unit of PRBC; hemoglobin is stable at 9.0    Hypertension -Continue metoprolol, Hytrin   Persistent atrial fibrillation, chronic -Eliquis has been restarted   CKD stage IIIb -Creatinine stable   COPD -Not in exacerbation continue DuoNeb as needed   Chronic systolic CHF -Recent echocardiogram showed EF of 25 to 30% with global hypokinesis -Continue oral Lasix    B12 deficiency -Vitamin B12 level was 90 pg/ml -We will give 1 dose of IM vitamin B12 1000 mcg x 1 -We will discharge on vitamin B12 p.o. 1000 mcg p.o.  daily -Follow-up PCP for further recommendations -Recommend check B12 level in 6 to 8 weeks  Hypokalemia -Potassium was 3.3 -We will give 1 dose of K-Dur 40 mEq p.o. x1 today before discharge.  Procedures: EGD Colonoscopy  Consultations: Gastroenterology  Discharge Exam: Vitals:   04/16/21 1239 04/16/21 1328  BP:  120/78  Pulse:  83  Resp:    Temp:  98.1 F (36.7 C)  SpO2: (!) 88% 97%    General: Appears in no acute distress Cardiovascular: S1-S2, regular, no murmur auscultated Respiratory: Clear to auscultation bilaterally  Discharge Instructions   Discharge Instructions     Diet - low sodium heart healthy   Complete by: As directed    Increase activity slowly   Complete by: As directed    No wound care   Complete by: As directed       Allergies as of 04/16/2021       Reactions   Amlodipine Besy-benazepril Hcl Cough   Hctz [hydrochlorothiazide] Other (See Comments)   Per family, every diuretic the patient has been on flushes out her out and ultimately results in CRAMPING/JAUNDICE (added potassium might help)   Other Other (See Comments)   History of diverticulitis- CANNOT TOLERATE NUTS, CORN, AND ANY FOODS NOT EASILY DIGESTED- the patient AVOIDS these   Cyclobenzaprine Other (See Comments)   Confusion- does not tolerate well    Diclofenac Sodium Diarrhea   Hydralazine Other (See Comments)   Per family, every diuretic the patient has been on flushes out her out and ultimately results in CRAMPING/JAUNDICE (added potassium might help)   Methylprednisolone Other (See Comments)  Medrol/CORTICOSTEROIDS = A LOT OF ENERGY   Oxycodone Other (See Comments)   Caused patient to "act crazy"        Medication List     TAKE these medications    acetaminophen 500 MG tablet Commonly known as: TYLENOL Take 500 mg by mouth 3 (three) times daily as needed for headache (pain). Take with tramadol   atorvastatin 40 MG tablet Commonly known as: LIPITOR TAKE 1  TABLET BY MOUTH DAILY AT 6 PM. What changed: when to take this   cholecalciferol 25 MCG (1000 UNIT) tablet Commonly known as: VITAMIN D3 Take 1,000 Units by mouth 2 (two) times daily.   Co Q-10 300 MG Caps Take 300 mg by mouth in the morning.   [facility] Aspirin Adult Low Dose 81 MG chewable tablet Generic drug: aspirin CHEW 1 TABLET (81 MG TOTAL) BY MOUTH DAILY. What changed:  how much to take when to take this   Eliquis 5 MG Tabs tablet Generic drug: apixaban TAKE 1 TABLET BY MOUTH TWICE A DAY What changed: how much to take   fenofibrate 160 MG tablet TAKE 1 TABLET BY MOUTH EVERY DAY What changed: when to take this   ferrous gluconate 324 MG tablet Commonly known as: FERGON TAKE 1 TABLET BY MOUTH DAILY WITH BREAKFAST What changed: when to take this   furosemide 20 MG tablet Commonly known as: LASIX Take 1 tablet (20 mg total) by mouth daily. What changed:  when to take this additional instructions   Lidocaine-Menthol (Spray) 4-1 % Liqd Apply 1 spray topically daily as needed (back/knee pain/restless legs).   losartan 100 MG tablet Commonly known as: COZAAR TAKE 1 TABLET BY MOUTH EVERY DAY What changed: when to take this   Magnesium 200 MG Tabs Take 200 mg by mouth every morning.   metoprolol tartrate 50 MG tablet Commonly known as: LOPRESSOR TAKE 1 TABLET BY MOUTH TWICE A DAY   multivitamin with minerals Tabs tablet Take 1 tablet by mouth every morning.   potassium chloride 20 MEQ/15ML (10%) Soln Take 3.8 mLs (5.0667 mEq total) by mouth daily. What changed:  how much to take when to take this additional instructions   sodium chloride 0.65 % Soln nasal spray Commonly known as: OCEAN Place 1 spray into both nostrils daily as needed for congestion.   terazosin 10 MG capsule Commonly known as: HYTRIN Take 1 capsule (10 mg total) by mouth daily at 12 noon. What changed: when to take this   traMADol 50 MG tablet Commonly known as: ULTRAM TAKE 1 TABLET  (50 MG TOTAL) BY MOUTH IN THE MORNING, AT NOON, AND AT BEDTIME. What changed: See the new instructions.   vitamin B-12 1000 MCG tablet Commonly known as: CYANOCOBALAMIN Take 1 tablet (1,000 mcg total) by mouth daily.       Allergies  Allergen Reactions   Amlodipine Besy-Benazepril Hcl Cough   Hctz [Hydrochlorothiazide] Other (See Comments)    Per family, every diuretic the patient has been on flushes out her out and ultimately results in CRAMPING/JAUNDICE (added potassium might help)   Other Other (See Comments)    History of diverticulitis- CANNOT TOLERATE NUTS, CORN, AND ANY FOODS NOT EASILY DIGESTED- the patient AVOIDS these   Cyclobenzaprine Other (See Comments)    Confusion- does not tolerate well    Diclofenac Sodium Diarrhea   Hydralazine Other (See Comments)    Per family, every diuretic the patient has been on flushes out her out and ultimately results in CRAMPING/JAUNDICE (added potassium  might help)   Methylprednisolone Other (See Comments)    Medrol/CORTICOSTEROIDS = A LOT OF ENERGY   Oxycodone Other (See Comments)    Caused patient to "act crazy"    Follow-up Information     Nafziger, Tommi Rumps, NP Follow up in 2 week(s).   Specialty: Family Medicine Contact information: [address] [place] [address] [phone]         Burnell Blanks, MD .   Specialty: Cardiology Contact information: [address] [place] [address] [phone]                  The results of significant diagnostics from this hospitalization (including imaging, microbiology, ancillary and laboratory) are listed below for reference.    Significant Diagnostic Studies: No results found.  Microbiology: No results found for this or any previous visit (from the past 240 hour(s)).   Labs: Basic Metabolic Panel: Recent Labs  Lab 04/13/21 1132 04/14/21 0523 04/15/21 0519 04/16/21 0551  NA 138 143 142 144  K 4.1 3.8 4.0 3.3*  CL 105  112* 110 115*  CO2 23 25 26  20*  GLUCOSE 126* 96 129* 139*  BUN 37* 34* 21 17  CREATININE 1.37* 1.02* 0.82 0.86  CALCIUM 8.5* 8.7* 8.8* 9.1   Liver Function Tests: Recent Labs  Lab 04/15/21 0519  AST 22  ALT 15  ALKPHOS 28*  BILITOT 0.7  PROT 5.5*  ALBUMIN 3.2*   No results for input(s): LIPASE, AMYLASE in the last 168 hours. No results for input(s): AMMONIA in the last 168 hours. CBC: Recent Labs  Lab 04/13/21 1132 04/14/21 0523 04/15/21 0519 04/16/21 0551  WBC 4.2 4.4 4.5 5.6  HGB 7.2* 7.8* 8.1* 9.0*  HCT 23.5* 25.4* 26.0* 29.0*  MCV 98.3 99.2 97.0 99.0  PLT 251 239 240 300   Cardiac Enzymes: No results for input(s): CKTOTAL, CKMB, CKMBINDEX, TROPONINI in the last 168 hours. BNP: BNP (last 3 results) Recent Labs    01/04/21 0309 01/10/21 1934 01/29/21 0636  BNP 1,852.7* 1,752.8* 1,896.8*    ProBNP (last 3 results) No results for input(s): PROBNP in the last 8760 hours.  CBG: Recent Labs  Lab 04/13/21 1856 04/15/21 1335  GLUCAP 147* 120*       Signed:  Oswald Hillock MD.  [facility] 04/16/2021, 1:37 PM

## 2021-04-16 NOTE — Transfer of Care (Signed)
Immediate Anesthesia Transfer of Care Note  Patient: Tara Fields  Procedure(s) Performed: COLONOSCOPY WITH PROPOFOL  Patient Location: PACU and Endoscopy Unit  Anesthesia Type:MAC  Level of Consciousness: awake, alert  and oriented  Airway & Oxygen Therapy: Patient Spontanous Breathing and Patient connected to face mask  Post-op Assessment: Report given to RN and Post -op Vital signs reviewed and stable  Post vital signs: Reviewed and stable  Last Vitals:  Vitals Value Taken Time  BP 111/52 04/16/21 0807  Temp 36.7 C 04/16/21 0807  Pulse 39 04/16/21 0810  Resp 26 04/16/21 0810  SpO2 88 % 04/16/21 0810  Vitals shown include unvalidated device data.  Last Pain:  Vitals:   04/16/21 0807  TempSrc: Axillary  PainSc:          Complications: No notable events documented.

## 2021-04-16 NOTE — Progress Notes (Signed)
Mobility Specialist - Progress Note    04/16/21 1239  Oxygen Therapy  SpO2 (!) 88 %  O2 Device Nasal Cannula  O2 Flow Rate (L/min) 2 L/min  Mobility  Activity Ambulated in hall  Level of Assistance Contact guard assist, steadying assist  Assistive Device Front wheel walker  Distance Ambulated (ft) 60 ft  Mobility Ambulated with assistance in hallway  Mobility Response Tolerated well  Mobility performed by Mobility specialist  $Mobility charge 1 Mobility    Pre-mobility: 96% SpO2 During mobility: 101 HR,  85-90%SpO2 Post-mobility: 99% SPO2  Pt was eager to ambulate at beginning of session, but stated feeling tired and lightheaded with sitting EOB. Pt rested for ~2 minutes before standing and beginning ambulation of about 60 ft in hallway while using RW and connected to 2L of O2. 3 standing rest breaks were required to help with pt's breathing and she was encouraged to stand upright to help with airways however, pt continued to bend forward and use RW as support. O2 levels ranged in middle to upper 80's and low 90's during session. Pt was then returned to bed after ambulation and stated feeling "worn out". She was left with call bell at side and was encouraged to sit upright.   [place] Specialist Acute Rehabilitation Services Phone: [phone] 04/16/21, 12:45 PM

## 2021-04-16 NOTE — Op Note (Signed)
[facility] Patient Name: Tara Fields Procedure Date: 04/16/2021 MRN: [id number] Attending MD: Milus Banister , MD Date of Birth: 1928-06-12 CSN: [id number] Age: 87 Admit Type: Outpatient Procedure:                Colonoscopy Indications:              Anemia, FOBT + stool, EGD yesterday unrevealing Providers:                Milus Banister, MD, Jeanella Cara, RN,                            Tyna Jaksch Technician Referring MD:              Medicines:                Monitored Anesthesia Care Complications:            No immediate complications. Estimated blood loss:                            None. Estimated Blood Loss:     Estimated blood loss: none. Procedure:                Pre-Anesthesia Assessment:                           - Prior to the procedure, a History and Physical                            was performed, and patient medications and                            allergies were reviewed. The patient's tolerance of                            previous anesthesia was also reviewed. The risks                            and benefits of the procedure and the sedation                            options and risks were discussed with the patient.                            All questions were answered, and informed consent                            was obtained. Prior Anticoagulants: The patient has                            taken Eliquis (apixaban), last dose was 3 days                            prior to procedure. ASA Grade Assessment: IV - A  patient with severe systemic disease that is a                            constant threat to life. After reviewing the risks                            and benefits, the patient was deemed in                            satisfactory condition to undergo the procedure.                           After obtaining informed consent, the colonoscope                            was passed under  direct vision. Throughout the                            procedure, the patient's blood pressure, pulse, and                            oxygen saturations were monitored continuously. The                            CF-HQ190L ([id number]) Olympus colonoscope was                            introduced through the anus and advanced to the the                            cecum, identified by appendiceal orifice and                            ileocecal valve. The colonoscopy was performed                            without difficulty. The patient tolerated the                            procedure well. The quality of the bowel                            preparation was good. The ileocecal valve,                            appendiceal orifice, and rectum were photographed. Scope In: 7:43:46 AM Scope Out: 8:01:20 AM Scope Withdrawal Time: 0 hours 8 minutes 18 seconds  Total Procedure Duration: 0 hours 17 minutes 34 seconds  Findings:      Multiple small and large-mouthed diverticula were found in the left       colon.      External and internal hemorrhoids were found. The hemorrhoids were small.      The exam was otherwise without abnormality on direct and retroflexion       views. Impression:               -  Diverticulosis in the left colon.                           - External and internal hemorrhoids.                           - The examination was otherwise normal on direct                            and retroflexion views.                           - No polyps or cancers. Moderate Sedation:      Not Applicable - Patient had care per Anesthesia. Recommendation:           - OK to resume her blood thinner today. No further                            GI testing is planned for now.                           - Please call or page with any further questions or                            concerns.                           - I discussed this with her son on the phone. Procedure Code(s):        ---  Professional ---                           [phone], Colonoscopy, flexible; diagnostic, including                            collection of specimen(s) by brushing or washing,                            when performed (separate procedure) Diagnosis Code(s):        --- Professional ---                           K64.8, Other hemorrhoids                           D50.9, Iron deficiency anemia, unspecified                           K57.30, Diverticulosis of large intestine without                            perforation or abscess without bleeding CPT copyright 2019 American Medical Association. All rights reserved. The codes documented in this report are preliminary and upon coder review may  be revised to meet current compliance requirements. Milus Banister, MD 04/16/2021 8:13:14 AM This report has been signed electronically. Number of Addenda: 0

## 2021-04-17 ENCOUNTER — Telehealth: Payer: Self-pay | Admitting: Adult Health

## 2021-04-17 ENCOUNTER — Encounter (HOSPITAL_COMMUNITY): Payer: Self-pay | Admitting: Gastroenterology

## 2021-04-17 NOTE — Telephone Encounter (Signed)
Tara Fields from [facility] was calling to receive verbal orders for skilled nursing once a week for nine weeks   Good callback number is [phone] [facility] to leave a message     Please Advise

## 2021-04-20 NOTE — Anesthesia Postprocedure Evaluation (Signed)
Anesthesia Post Note  Patient: Tara Fields  Procedure(s) Performed: ESOPHAGOGASTRODUODENOSCOPY (EGD) WITH PROPOFOL     Anesthesia Post Evaluation No notable events documented.  Last Vitals:  Vitals:   04/16/21 1239 04/16/21 1328  BP:  120/78  Pulse:  83  Resp:    Temp:  36.7 C  SpO2: (!) 88% 97%    Last Pain:  Vitals:   04/16/21 1328  TempSrc: Oral  PainSc:                  Nolon Nations

## 2021-04-21 ENCOUNTER — Encounter (HOSPITAL_COMMUNITY): Payer: Self-pay | Admitting: Gastroenterology

## 2021-04-22 ENCOUNTER — Telehealth: Payer: Self-pay

## 2021-04-22 NOTE — Telephone Encounter (Signed)
Transition Care Management Unsuccessful Follow-up Telephone Call  Date of discharge and from where:  04/16/21 from [facility]  Attempts:  1st Attempt  Reason for unsuccessful TCM follow-up call:  No answer/busy  Thea Silversmith, RN, MSN, BSN, [place] Care Management Coordinator [phone]

## 2021-04-23 ENCOUNTER — Other Ambulatory Visit: Payer: Self-pay

## 2021-04-23 ENCOUNTER — Telehealth: Payer: Self-pay

## 2021-04-23 ENCOUNTER — Other Ambulatory Visit: Payer: Medicare Other | Admitting: *Deleted

## 2021-04-23 DIAGNOSIS — I5022 Chronic systolic (congestive) heart failure: Secondary | ICD-10-CM

## 2021-04-23 DIAGNOSIS — E11638 Type 2 diabetes mellitus with other oral complications: Secondary | ICD-10-CM

## 2021-04-23 LAB — BASIC METABOLIC PANEL
BUN/Creatinine Ratio: 21 (ref 12–28)
BUN: 23 mg/dL (ref 10–36)
CO2: 23 mmol/L (ref 20–29)
Calcium: 8.8 mg/dL (ref 8.7–10.3)
Chloride: 103 mmol/L (ref 96–106)
Creatinine, Ser: 1.07 mg/dL — ABNORMAL HIGH (ref 0.57–1.00)
Glucose: 119 mg/dL — ABNORMAL HIGH (ref 65–99)
Potassium: 4.6 mmol/L (ref 3.5–5.2)
Sodium: 141 mmol/L (ref 134–144)
eGFR: 49 mL/min/{1.73_m2} — ABNORMAL LOW (ref >59)

## 2021-04-23 NOTE — Telephone Encounter (Signed)
Transition Care Management Unsuccessful Follow-up Telephone Call  Date of discharge and from where:  04/16/21 from [facility]  Attempts:  2nd Attempt  Reason for unsuccessful TCM follow-up call:  No answer/busy  Thea Silversmith, RN, MSN, BSN, [place] Care Management Coordinator [phone]

## 2021-04-24 ENCOUNTER — Telehealth: Payer: Self-pay

## 2021-04-24 ENCOUNTER — Telehealth: Payer: Self-pay | Admitting: Adult Health

## 2021-04-24 NOTE — Telephone Encounter (Signed)
Transition Care Management Follow-up Telephone Call Date of discharge and from where: 04/16/2021 from [facility] How have you been since you were released from the hospital? Feeling stronger, eating well. Any questions or concerns? No  Items Reviewed: Did the pt receive and understand the discharge instructions provided? Yes  Medications obtained and verified? Yes  Other? No  Any new allergies since your discharge? No  Dietary orders reviewed? Yes  low salt diet Do you have support at home? Yes   family visits daily, patient is in independently living  [place] and Equipment/Supplies: Were home health services ordered? yes If so, what is the name of the agency? [facility]  Has the agency set up a time to come to the patient's home? yes Were any new equipment or medical supplies ordered?  No What is the name of the medical supply agency? None Were you able to get the supplies/equipment? not applicable Do you have any questions related to the use of the equipment or supplies? No  Functional Questionnaire: (I = Independent and D = Dependent) ADLs: I  Bathing/Dressing- I  Meal Prep- I  Eating- I  Maintaining continence- poise pads  Transferring/Ambulation- Assisted with walker  Managing Meds- D  son manages medications  Follow up appointments reviewed:  PCP Hospital f/u appt confirmed? Yes  Scheduled to see PCP  on 04/29/2021 @ 830am. [place] [facility] f/u appt confirmed? No   Are transportation arrangements needed? No  If their condition worsens, is the pt aware to call PCP or go to the Emergency Dept.? Yes Was the patient provided with contact information for the PCP's office or ED? Yes Was to pt encouraged to call back with questions or concerns? Yes   Tomasa Rand, RN, BSN, CEN [facility] [employer] [phone]

## 2021-04-24 NOTE — Telephone Encounter (Signed)
[place] (physical therapist) from [facility] called to put in verbal orders. Fields stated that the visit frequency for physical therapy would be 2 times a week for 1 week and 1 time a week for 5 weeks.  Tara Fields can be contacted at [phone].  Please advise.

## 2021-04-27 ENCOUNTER — Other Ambulatory Visit: Payer: Self-pay

## 2021-04-27 DIAGNOSIS — I5022 Chronic systolic (congestive) heart failure: Secondary | ICD-10-CM

## 2021-04-27 MED ORDER — TORSEMIDE 10 MG PO TABS: 10.0000 mg | ORAL_TABLET | Freq: Every day | ORAL | 3 refills | Status: DC

## 2021-04-27 NOTE — Telephone Encounter (Signed)
Okay for verbal orders? Please advise 

## 2021-04-27 NOTE — Telephone Encounter (Signed)
The patient's Son has been notified of the result and verbalized understanding.  All questions (if any) were answered. Golda Zavalza, [facility] 04/27/2021 2:59 PM    He understands the medication changes.. Lab appt was made.

## 2021-04-28 ENCOUNTER — Other Ambulatory Visit: Payer: Self-pay

## 2021-04-29 ENCOUNTER — Encounter: Payer: Self-pay | Admitting: Adult Health

## 2021-04-29 ENCOUNTER — Ambulatory Visit (INDEPENDENT_AMBULATORY_CARE_PROVIDER_SITE_OTHER): Payer: Medicare Other | Admitting: Adult Health

## 2021-04-29 VITALS — BP 140/80 | HR 89 | Temp 97.6°F | Ht 60.0 in | Wt 150.0 lb

## 2021-04-29 DIAGNOSIS — D62 Acute posthemorrhagic anemia: Secondary | ICD-10-CM

## 2021-04-29 DIAGNOSIS — I5022 Chronic systolic (congestive) heart failure: Secondary | ICD-10-CM

## 2021-04-29 DIAGNOSIS — I10 Essential (primary) hypertension: Secondary | ICD-10-CM | POA: Diagnosis not present

## 2021-04-29 DIAGNOSIS — N1832 Chronic kidney disease, stage 3b: Secondary | ICD-10-CM | POA: Diagnosis not present

## 2021-04-29 DIAGNOSIS — Z23 Encounter for immunization: Secondary | ICD-10-CM

## 2021-04-29 DIAGNOSIS — E1169 Type 2 diabetes mellitus with other specified complication: Secondary | ICD-10-CM

## 2021-04-29 DIAGNOSIS — I4819 Other persistent atrial fibrillation: Secondary | ICD-10-CM

## 2021-04-29 DIAGNOSIS — E538 Deficiency of other specified B group vitamins: Secondary | ICD-10-CM

## 2021-04-29 DIAGNOSIS — J418 Mixed simple and mucopurulent chronic bronchitis: Secondary | ICD-10-CM

## 2021-04-29 LAB — COMPREHENSIVE METABOLIC PANEL
ALT: 14 U/L (ref 0–35)
AST: 21 U/L (ref 0–37)
Albumin: 3.6 g/dL (ref 3.5–5.2)
Alkaline Phosphatase: 44 U/L (ref 39–117)
BUN: 31 mg/dL — ABNORMAL HIGH (ref 6–23)
CO2: 27 mEq/L (ref 19–32)
Calcium: 9.2 mg/dL (ref 8.4–10.5)
Chloride: 102 mEq/L (ref 96–112)
Creatinine, Ser: 1.08 mg/dL (ref 0.40–1.20)
GFR: 45 mL/min — ABNORMAL LOW (ref >60.00)
Glucose, Bld: 121 mg/dL — ABNORMAL HIGH (ref 70–99)
Potassium: 3.8 mEq/L (ref 3.5–5.1)
Sodium: 139 mEq/L (ref 135–145)
Total Bilirubin: 0.7 mg/dL (ref 0.2–1.2)
Total Protein: 5.8 g/dL — ABNORMAL LOW (ref 6.0–8.3)

## 2021-04-29 LAB — CBC WITH DIFFERENTIAL/PLATELET
Basophils Absolute: 0 10*3/uL (ref 0.0–0.1)
Basophils Relative: 0.8 % (ref 0.0–3.0)
Eosinophils Absolute: 0.1 10*3/uL (ref 0.0–0.7)
Eosinophils Relative: 2.3 % (ref 0.0–5.0)
HCT: 30.5 % — ABNORMAL LOW (ref 36.0–46.0)
Hemoglobin: 9.8 g/dL — ABNORMAL LOW (ref 12.0–15.0)
Lymphocytes Relative: 14 % (ref 12.0–46.0)
Lymphs Abs: 0.6 10*3/uL — ABNORMAL LOW (ref 0.7–4.0)
MCHC: 32.1 g/dL (ref 30.0–36.0)
MCV: 92.8 fl (ref 78.0–100.0)
Monocytes Absolute: 0.3 10*3/uL (ref 0.1–1.0)
Monocytes Relative: 8.2 % (ref 3.0–12.0)
Neutro Abs: 3.1 10*3/uL (ref 1.4–7.7)
Neutrophils Relative %: 74.7 % (ref 43.0–77.0)
Platelets: 273 10*3/uL (ref 150.0–400.0)
RBC: 3.29 Mil/uL — ABNORMAL LOW (ref 3.87–5.11)
RDW: 18.3 % — ABNORMAL HIGH (ref 11.5–15.5)
WBC: 4.2 10*3/uL (ref 4.0–10.5)

## 2021-04-29 LAB — VITAMIN B12: Vitamin B-12: 453 pg/mL (ref 211–911)

## 2021-04-29 LAB — HEMOGLOBIN A1C: Hgb A1c MFr Bld: 5 % (ref 4.6–6.5)

## 2021-04-29 NOTE — Progress Notes (Signed)
Subjective:    Patient ID: Tara Fields, female    DOB: 15-Oct-1927, 87 y.o.   MRN: [id number]  HPI 87 year old female who  has a past medical history of Aortic stenosis, CAD (coronary artery disease), COPD (chronic obstructive pulmonary disease) ([place]), Diabetes mellitus ([place]), Diverticulitis, GERD (gastroesophageal reflux disease), HTN (hypertension), Hyperlipidemia, Legally blind in left eye, as defined in [place], and Tobacco abuse.  She presents to the office today with her son for hospital follow up.   Admit Date 04/13/2021 Discharge Date 04/16/2021  He presented on 04/13/2021 for the evaluation of generalized weakness and fatigue with shortness of breath on exertion.  Her work-up in the ER showed a hemoglobin of 7.3 down from 10.7-2 months ago.   Hospital Course  Acute blood loss Anemia  -FOBT was positive -Eliquis was initially held -She underwent EGD and colonoscopy, EGD was normal except for erosive gastropathy.  Colonoscopy showed left-sided diverticulosis with internal and external hemorrhoids.  There was no further work-up recommended per GI -She was transfused 1 unit packed red blood cells and hemoglobin was stable at 9.0 on discharge.  Iron, ferritin, iron TIBC were within normal limits  Hypertension -Continued on home medication  Persistent atrial fibrillation, chronic -Eliquis had been restarted  CKD stage IIIb -Knee function stable during hospital admission  Chronic systolic CHF -Recent echocardiogram showed EF of 25 to 30% with global hypokinesis -She was continued on oral Lasix  Vitamin B12 deficiency -During admission her B12 level was 90 PG/mL.  She was given 1 dose of IM vitamin B12 1000 mcg and was discharged on vitamin B12 p.o. 1000 mcg daily.  Recommended recheck B12 in 6 to 8 weeks  Hypokalemia -Potassium was 3.3.  She was given 1 dose of K-Dur 40 mEq before discharge  Days she reports that she is feeling better in regards to generalized weakness and  fatigue.  She has not noticed any GI bleeding.  Her biggest complaint today is that of worsening swelling in her lower extremities, worse on the left than the right.  Cardiology did switch her from furosemide to torsemide to take 20 mg daily x4 days and then 1 mg daily.  This morning was her first dose of torsemide.  Her weight is up to 150 pounds.  She denies worsening shortness of breath Wt Readings from Last 3 Encounters:  04/29/21 150 lb (68 kg)  04/16/21 147 lb 0.8 oz (66.7 kg)  04/01/21 143 lb 12.8 oz (65.2 kg)   Review of Systems  Constitutional: Negative.   HENT: Negative.    Cardiovascular:  Positive for leg swelling.  Gastrointestinal: Negative.   Genitourinary: Negative.   Musculoskeletal:  Positive for arthralgias, back pain and gait problem.  Hematological: Negative.   Psychiatric/Behavioral: Negative.    Past Medical History:  Diagnosis Date   Aortic stenosis    s/p TAVR 26 mm Edwards Sapien 3 THV July 2020   CAD (coronary artery disease)    COPD (chronic obstructive pulmonary disease) (HCC)    Diabetes mellitus (HCC)    Diverticulitis    GERD (gastroesophageal reflux disease)    HTN (hypertension)    Hyperlipidemia    Legally blind in left eye, as defined in [place]    Tobacco abuse     Social History   Socioeconomic History   Marital status: Widowed    Spouse name: Not on file   Number of children: 2   Years of education: Not on file   Highest education level: High  school graduate  Occupational History   Occupation: Retired    Comment: homemaker  Tobacco Use   Smoking status: Former    Packs/day: 1.00    Years: 70.00    Pack years: 70.00    Types: Cigarettes    Quit date: 12/15/2019    Years since quitting: 1.3   Smokeless tobacco: Never   Tobacco comments:    Pt states quiting a year ago. 01/03/20 [facility]  Vaping Use   Vaping Use: Never used  Substance and Sexual Activity   Alcohol use: No    Alcohol/week: 0.0 standard drinks   Drug use: No   Sexual  activity: Not Currently  Other Topics Concern   Not on file  Social History Narrative   Widowed- was married for 84 years    Has two sons       Social Determinants of Radio broadcast assistant Strain: Low Risk    Difficulty of Paying Living Expenses: Not hard at all  Food Insecurity: No Food Insecurity   Worried About Charity fundraiser in the Last Year: Never true   Arboriculturist in the Last Year: Never true  Transportation Needs: No Transportation Needs   Lack of Transportation (Medical): No   Lack of Transportation (Non-Medical): No  Physical Activity: Insufficiently Active   Days of Exercise per Week: 5 days   Minutes of Exercise per Session: 10 min  Stress: No Stress Concern Present   Feeling of Stress : Not at all  Social Connections: Socially Isolated   Frequency of Communication with Friends and Family: Three times a week   Frequency of Social Gatherings with Friends and Family: Three times a week   Attends Religious Services: Never   Active Member of Clubs or Organizations: No   Attends Archivist Meetings: Never   Marital Status: Widowed  Human resources officer Violence: Not At Risk   Fear of Current or Ex-Partner: No   Emotionally Abused: No   Physically Abused: No   Sexually Abused: No    Past Surgical History:  Procedure Laterality Date   ABDOMINAL HYSTERECTOMY     APPENDECTOMY     BACK SURGERY     CATARACT EXTRACTION, BILATERAL     CHOLECYSTECTOMY     COLONOSCOPY WITH PROPOFOL N/A 04/16/2021   Procedure: COLONOSCOPY WITH PROPOFOL;  Surgeon: Milus Banister, MD;  Location: [facility];  Service: Gastroenterology;  Laterality: N/A;   ESOPHAGOGASTRODUODENOSCOPY (EGD) WITH PROPOFOL N/A 04/15/2021   Procedure: ESOPHAGOGASTRODUODENOSCOPY (EGD) WITH PROPOFOL;  Surgeon: Doran Stabler, MD;  Location: [facility];  Service: Gastroenterology;  Laterality: N/A;    Family History  Problem Relation Age of Onset   Diabetes Mother    Stroke Brother     Cerebral palsy Brother    Coronary artery disease Neg Hx     Allergies  Allergen Reactions   Amlodipine Besy-Benazepril Hcl Cough   Hctz [Hydrochlorothiazide] Other (See Comments)    Per family, every diuretic the patient has been on flushes out her out and ultimately results in CRAMPING/JAUNDICE (added potassium might help)   Other Other (See Comments)    History of diverticulitis- CANNOT TOLERATE NUTS, CORN, AND ANY FOODS NOT EASILY DIGESTED- the patient AVOIDS these   Cyclobenzaprine Other (See Comments)    Confusion- does not tolerate well    Diclofenac Sodium Diarrhea   Hydralazine Other (See Comments)    Per family, every diuretic the patient has been on flushes out her out and  ultimately results in CRAMPING/JAUNDICE (added potassium might help)   Methylprednisolone Other (See Comments)    Medrol/CORTICOSTEROIDS = A LOT OF ENERGY   Oxycodone Other (See Comments)    Caused patient to "act crazy"    Current Outpatient Medications on File Prior to Visit  Medication Sig Dispense Refill   acetaminophen (TYLENOL) 500 MG tablet Take 500 mg by mouth 3 (three) times daily as needed for headache (pain). Take with tramadol     atorvastatin (LIPITOR) 40 MG tablet TAKE 1 TABLET BY MOUTH DAILY AT 6 PM. (Patient taking differently: Take 40 mg by mouth every evening.) 90 tablet 1   cholecalciferol (VITAMIN D3) 25 MCG (1000 UNIT) tablet Take 1,000 Units by mouth 2 (two) times daily.     Coenzyme Q10 (CO Q-10) 300 MG CAPS Take 300 mg by mouth in the morning.     [facility] ASPIRIN ADULT LOW DOSE 81 MG chewable tablet CHEW 1 TABLET (81 MG TOTAL) BY MOUTH DAILY. (Patient taking differently: Chew 81 mg by mouth every morning.) 90 tablet 4   ELIQUIS 5 MG TABS tablet TAKE 1 TABLET BY MOUTH TWICE A DAY (Patient taking differently: Take 5 mg by mouth 2 (two) times daily.) 180 tablet 1   fenofibrate 160 MG tablet TAKE 1 TABLET BY MOUTH EVERY DAY (Patient taking differently: Take 160 mg by mouth every morning.) 90  tablet 1   ferrous gluconate (FERGON) 324 MG tablet TAKE 1 TABLET BY MOUTH DAILY WITH BREAKFAST (Patient taking differently: Take 324 mg by mouth every morning.) 90 tablet 1   Lidocaine-Menthol, Spray, 4-1 % LIQD Apply 1 spray topically daily as needed (back/knee pain/restless legs).     losartan (COZAAR) 100 MG tablet TAKE 1 TABLET BY MOUTH EVERY DAY (Patient taking differently: Take 100 mg by mouth every morning.) 90 tablet 3   Magnesium 200 MG TABS Take 200 mg by mouth every morning.     metoprolol tartrate (LOPRESSOR) 50 MG tablet TAKE 1 TABLET BY MOUTH TWICE A DAY (Patient taking differently: Take 50 mg by mouth 2 (two) times daily.) 180 tablet 1   Multiple Vitamin (MULTIVITAMIN WITH MINERALS) TABS tablet Take 1 tablet by mouth every morning.     potassium chloride 20 MEQ/15ML (10%) SOLN Take 3.8 mLs (5.0667 mEq total) by mouth daily. (Patient taking differently: Take 6.7 mEq by mouth See admin instructions. Take 5 mls (6.7 meq) by mouth every morning, take another 5 mls (6.7 meq) with every extra dose of furosemide) 114 mL 2   sodium chloride (OCEAN) 0.65 % SOLN nasal spray Place 1 spray into both nostrils daily as needed for congestion.     terazosin (HYTRIN) 10 MG capsule Take 1 capsule (10 mg total) by mouth daily at 12 noon. (Patient taking differently: Take 10 mg by mouth every evening.) 90 capsule 1   torsemide (DEMADEX) 10 MG tablet Take 1 tablet (10 mg total) by mouth daily. 90 tablet 3   traMADol (ULTRAM) 50 MG tablet TAKE 1 TABLET (50 MG TOTAL) BY MOUTH IN THE MORNING, AT NOON, AND AT BEDTIME. (Patient taking differently: Take 50 mg by mouth 3 (three) times daily as needed (pain/headache). Take with tylenol) 90 tablet 2   vitamin B-12 (CYANOCOBALAMIN) 1000 MCG tablet Take 1 tablet (1,000 mcg total) by mouth daily. 30 tablet 3   No current facility-administered medications on file prior to visit.    BP 140/80   Pulse 89   Temp 97.6 F (36.4 C) (Oral)   Ht 5' (1.524  m)   Wt 150 lb  (68 kg)   SpO2 98%   BMI 29.29 kg/m       Objective:   Physical Exam Vitals and nursing note reviewed.  Constitutional:      Appearance: Normal appearance.  Cardiovascular:     Rate and Rhythm: Normal rate. Rhythm irregularly irregular.     Pulses: Normal pulses.     Heart sounds: Normal heart sounds.  Pulmonary:     Effort: Pulmonary effort is normal.     Breath sounds: Normal breath sounds.  Musculoskeletal:     Right lower leg: 2+ Pitting Edema present.     Left lower leg: 3+ Pitting Edema present.  Skin:    General: Skin is warm and dry.     Capillary Refill: Capillary refill takes less than 2 seconds.  Neurological:     General: No focal deficit present.     Mental Status: She is alert and oriented to person, place, and time.  Psychiatric:        Mood and Affect: Mood normal.        Behavior: Behavior normal.        Thought Content: Thought content normal.        Judgment: Judgment normal.      Assessment & Plan:  1. Acute blood loss anemia -Reviewed hospital notes, discharge instructions, imaging, and labs.  No cause found for GI bleeding.  Encourage follow-up if she notices dark tarry stools.  We will recheck CBC today - CBC with Differential/Platelet; Future - Comprehensive metabolic panel; Future - Vitamin B12; Future - Hemoglobin A1c; Future - Hemoglobin A1c - Vitamin B12 - Comprehensive metabolic panel - CBC with Differential/Platelet  2. Essential hypertension - No change. Stable for age - CBC with Differential/Platelet; Future - Comprehensive metabolic panel; Future - Vitamin B12; Future - Hemoglobin A1c; Future - Hemoglobin A1c - Vitamin B12 - Comprehensive metabolic panel - CBC with Differential/Platelet  3. Other persistent atrial fibrillation ([place]) - continue Eliquis  - CBC with Differential/Platelet; Future - Comprehensive metabolic panel; Future - Vitamin B12; Future - Hemoglobin A1c; Future - Hemoglobin A1c - Vitamin B12 -  Comprehensive metabolic panel - CBC with Differential/Platelet  4. Stage 3b chronic kidney disease (HCC) - Stable  - CBC with Differential/Platelet; Future - Comprehensive metabolic panel; Future - Vitamin B12; Future - Hemoglobin A1c; Future - Hemoglobin A1c - Vitamin B12 - Comprehensive metabolic panel - CBC with Differential/Platelet   5. Chronic systolic CHF (congestive heart failure) ([place]) - Continue with Torsemide as directed by cardiology  - Follow up with Cardiology if not noticing any improvement  - CBC with Differential/Platelet; Future - Comprehensive metabolic panel; Future - Vitamin B12; Future - Hemoglobin A1c; Future - Hemoglobin A1c - Vitamin B12 - Comprehensive metabolic panel - CBC with Differential/Platelet  6. B12 deficiency - Continue oral B12 daily  - CBC with Differential/Platelet; Future - Comprehensive metabolic panel; Future - Vitamin B12; Future - Hemoglobin A1c; Future - Hemoglobin A1c - Vitamin B12 - Comprehensive metabolic panel - CBC with Differential/Platelet  7. Need for immunization against influenza  - Flu Vaccine QUAD High Dose(Fluad)  8. Type 2 diabetes mellitus with other specified complication, without long-term current use of insulin (HCC)  - Hemoglobin A1c; Future - Hemoglobin A1c  [name], NP

## 2021-04-29 NOTE — Telephone Encounter (Signed)
Verbal orders given to Beatty on confidential vm.

## 2021-05-01 ENCOUNTER — Other Ambulatory Visit: Payer: Self-pay | Admitting: Adult Health

## 2021-05-05 ENCOUNTER — Emergency Department (HOSPITAL_COMMUNITY): Payer: Medicare Other

## 2021-05-05 ENCOUNTER — Ambulatory Visit: Payer: Medicare Other | Admitting: Adult Health

## 2021-05-05 ENCOUNTER — Other Ambulatory Visit: Payer: Self-pay

## 2021-05-05 ENCOUNTER — Inpatient Hospital Stay (HOSPITAL_COMMUNITY)
Admission: EM | Admit: 2021-05-05 | Discharge: 2021-05-08 | DRG: 291 | Disposition: A | Discharge status: Home Health | Payer: Medicare Other | Attending: Internal Medicine | Admitting: Internal Medicine

## 2021-05-05 ENCOUNTER — Encounter (HOSPITAL_COMMUNITY): Payer: Self-pay

## 2021-05-05 DIAGNOSIS — I480 Paroxysmal atrial fibrillation: Secondary | ICD-10-CM | POA: Diagnosis present

## 2021-05-05 DIAGNOSIS — L89152 Pressure ulcer of sacral region, stage 2: Secondary | ICD-10-CM | POA: Diagnosis present

## 2021-05-05 DIAGNOSIS — D631 Anemia in chronic kidney disease: Secondary | ICD-10-CM | POA: Diagnosis present

## 2021-05-05 DIAGNOSIS — J449 Chronic obstructive pulmonary disease, unspecified: Secondary | ICD-10-CM | POA: Diagnosis present

## 2021-05-05 DIAGNOSIS — R0902 Hypoxemia: Secondary | ICD-10-CM

## 2021-05-05 DIAGNOSIS — D649 Anemia, unspecified: Secondary | ICD-10-CM | POA: Diagnosis not present

## 2021-05-05 DIAGNOSIS — I2510 Atherosclerotic heart disease of native coronary artery without angina pectoris: Secondary | ICD-10-CM | POA: Diagnosis present

## 2021-05-05 DIAGNOSIS — Z79899 Other long term (current) drug therapy: Secondary | ICD-10-CM

## 2021-05-05 DIAGNOSIS — I429 Cardiomyopathy, unspecified: Secondary | ICD-10-CM | POA: Diagnosis present

## 2021-05-05 DIAGNOSIS — Z7982 Long term (current) use of aspirin: Secondary | ICD-10-CM

## 2021-05-05 DIAGNOSIS — H548 Legal blindness, as defined in USA: Secondary | ICD-10-CM | POA: Diagnosis present

## 2021-05-05 DIAGNOSIS — L89151 Pressure ulcer of sacral region, stage 1: Secondary | ICD-10-CM | POA: Diagnosis present

## 2021-05-05 DIAGNOSIS — Z90710 Acquired absence of both cervix and uterus: Secondary | ICD-10-CM | POA: Diagnosis not present

## 2021-05-05 DIAGNOSIS — I5022 Chronic systolic (congestive) heart failure: Secondary | ICD-10-CM | POA: Insufficient documentation

## 2021-05-05 DIAGNOSIS — N1831 Chronic kidney disease, stage 3a: Secondary | ICD-10-CM | POA: Diagnosis present

## 2021-05-05 DIAGNOSIS — Z66 Do not resuscitate: Secondary | ICD-10-CM | POA: Diagnosis present

## 2021-05-05 DIAGNOSIS — E8770 Fluid overload, unspecified: Secondary | ICD-10-CM

## 2021-05-05 DIAGNOSIS — I509 Heart failure, unspecified: Secondary | ICD-10-CM

## 2021-05-05 DIAGNOSIS — E1122 Type 2 diabetes mellitus with diabetic chronic kidney disease: Secondary | ICD-10-CM | POA: Diagnosis present

## 2021-05-05 DIAGNOSIS — D500 Iron deficiency anemia secondary to blood loss (chronic): Secondary | ICD-10-CM | POA: Diagnosis present

## 2021-05-05 DIAGNOSIS — J9601 Acute respiratory failure with hypoxia: Secondary | ICD-10-CM | POA: Diagnosis present

## 2021-05-05 DIAGNOSIS — Z20822 Contact with and (suspected) exposure to covid-19: Secondary | ICD-10-CM | POA: Diagnosis present

## 2021-05-05 DIAGNOSIS — I5023 Acute on chronic systolic (congestive) heart failure: Secondary | ICD-10-CM | POA: Diagnosis present

## 2021-05-05 DIAGNOSIS — D6489 Other specified anemias: Secondary | ICD-10-CM | POA: Diagnosis present

## 2021-05-05 DIAGNOSIS — N179 Acute kidney failure, unspecified: Secondary | ICD-10-CM | POA: Diagnosis present

## 2021-05-05 DIAGNOSIS — I130 Hypertensive heart and chronic kidney disease with heart failure and stage 1 through stage 4 chronic kidney disease, or unspecified chronic kidney disease: Secondary | ICD-10-CM | POA: Diagnosis present

## 2021-05-05 DIAGNOSIS — R5381 Other malaise: Secondary | ICD-10-CM | POA: Diagnosis present

## 2021-05-05 DIAGNOSIS — Z823 Family history of stroke: Secondary | ICD-10-CM | POA: Diagnosis not present

## 2021-05-05 DIAGNOSIS — Z7901 Long term (current) use of anticoagulants: Secondary | ICD-10-CM

## 2021-05-05 DIAGNOSIS — R0602 Shortness of breath: Secondary | ICD-10-CM | POA: Diagnosis present

## 2021-05-05 DIAGNOSIS — Z87891 Personal history of nicotine dependence: Secondary | ICD-10-CM

## 2021-05-05 DIAGNOSIS — Z833 Family history of diabetes mellitus: Secondary | ICD-10-CM | POA: Diagnosis not present

## 2021-05-05 DIAGNOSIS — E785 Hyperlipidemia, unspecified: Secondary | ICD-10-CM | POA: Diagnosis present

## 2021-05-05 DIAGNOSIS — Z952 Presence of prosthetic heart valve: Secondary | ICD-10-CM

## 2021-05-05 DIAGNOSIS — Z7189 Other specified counseling: Secondary | ICD-10-CM | POA: Diagnosis not present

## 2021-05-05 DIAGNOSIS — K319 Disease of stomach and duodenum, unspecified: Secondary | ICD-10-CM | POA: Diagnosis present

## 2021-05-05 DIAGNOSIS — N189 Chronic kidney disease, unspecified: Secondary | ICD-10-CM | POA: Diagnosis not present

## 2021-05-05 DIAGNOSIS — E876 Hypokalemia: Secondary | ICD-10-CM | POA: Diagnosis present

## 2021-05-05 DIAGNOSIS — K219 Gastro-esophageal reflux disease without esophagitis: Secondary | ICD-10-CM | POA: Diagnosis present

## 2021-05-05 LAB — BASIC METABOLIC PANEL
Anion gap: 12 (ref 5–15)
BUN: 25 mg/dL — ABNORMAL HIGH (ref 8–23)
CO2: 23 mmol/L (ref 22–32)
Calcium: 9 mg/dL (ref 8.9–10.3)
Chloride: 102 mmol/L (ref 98–111)
Creatinine, Ser: 1.04 mg/dL — ABNORMAL HIGH (ref 0.44–1.00)
GFR, Estimated: 51 mL/min — ABNORMAL LOW (ref >60)
Glucose, Bld: 173 mg/dL — ABNORMAL HIGH (ref 70–99)
Potassium: 4.1 mmol/L (ref 3.5–5.1)
Sodium: 137 mmol/L (ref 135–145)

## 2021-05-05 LAB — CBC WITH DIFFERENTIAL/PLATELET
Abs Immature Granulocytes: 0.02 10*3/uL (ref 0.00–0.07)
Basophils Absolute: 0 10*3/uL (ref 0.0–0.1)
Basophils Relative: 1 %
Eosinophils Absolute: 0.1 10*3/uL (ref 0.0–0.5)
Eosinophils Relative: 1 %
HCT: 33.4 % — ABNORMAL LOW (ref 36.0–46.0)
Hemoglobin: 9.9 g/dL — ABNORMAL LOW (ref 12.0–15.0)
Immature Granulocytes: 0 %
Lymphocytes Relative: 8 %
Lymphs Abs: 0.4 10*3/uL — ABNORMAL LOW (ref 0.7–4.0)
MCH: 29.5 pg (ref 26.0–34.0)
MCHC: 29.6 g/dL — ABNORMAL LOW (ref 30.0–36.0)
MCV: 99.4 fL (ref 80.0–100.0)
Monocytes Absolute: 0.3 10*3/uL (ref 0.1–1.0)
Monocytes Relative: 6 %
Neutro Abs: 4.8 10*3/uL (ref 1.7–7.7)
Neutrophils Relative %: 84 %
Platelets: 292 10*3/uL (ref 150–400)
RBC: 3.36 MIL/uL — ABNORMAL LOW (ref 3.87–5.11)
RDW: 16.4 % — ABNORMAL HIGH (ref 11.5–15.5)
WBC: 5.7 10*3/uL (ref 4.0–10.5)
nRBC: 0 % (ref 0.0–0.2)

## 2021-05-05 LAB — TROPONIN I (HIGH SENSITIVITY)
Troponin I (High Sensitivity): 9 ng/L (ref <18)
Troponin I (High Sensitivity): 10 ng/L (ref <18)

## 2021-05-05 LAB — RESP PANEL BY RT-PCR (FLU A&B, COVID) ARPGX2
Influenza A by PCR: NEGATIVE
Influenza B by PCR: NEGATIVE
SARS Coronavirus 2 by RT PCR: NEGATIVE

## 2021-05-05 LAB — BRAIN NATRIURETIC PEPTIDE: B Natriuretic Peptide: 2584.5 pg/mL — ABNORMAL HIGH (ref 0.0–100.0)

## 2021-05-05 LAB — GLUCOSE, CAPILLARY: Glucose-Capillary: 123 mg/dL — ABNORMAL HIGH (ref 70–99)

## 2021-05-05 MED ORDER — APIXABAN 5 MG PO TABS
5.0000 mg | ORAL_TABLET | Freq: Two times a day (BID) | ORAL | Status: DC
  Administered 2021-05-05 – 2021-05-08 (×6): 5 mg via ORAL
  Filled 2021-05-05: qty 2
  Filled 2021-05-05 (×5): qty 1

## 2021-05-05 MED ORDER — POTASSIUM CHLORIDE CRYS ER 20 MEQ PO TBCR
20.0000 meq | EXTENDED_RELEASE_TABLET | Freq: Two times a day (BID) | ORAL | Status: DC
  Administered 2021-05-05 – 2021-05-08 (×6): 20 meq via ORAL
  Filled 2021-05-05 (×6): qty 1

## 2021-05-05 MED ORDER — ATORVASTATIN CALCIUM 40 MG PO TABS
40.0000 mg | ORAL_TABLET | Freq: Every evening | ORAL | Status: DC
  Administered 2021-05-05 – 2021-05-07 (×3): 40 mg via ORAL
  Filled 2021-05-05 (×3): qty 1

## 2021-05-05 MED ORDER — METOPROLOL TARTRATE 50 MG PO TABS
50.0000 mg | ORAL_TABLET | Freq: Two times a day (BID) | ORAL | Status: DC
  Administered 2021-05-05 – 2021-05-08 (×6): 50 mg via ORAL
  Filled 2021-05-05 (×6): qty 1

## 2021-05-05 MED ORDER — FUROSEMIDE 10 MG/ML IJ SOLN
40.0000 mg | Freq: Two times a day (BID) | INTRAMUSCULAR | Status: DC
  Administered 2021-05-05 – 2021-05-07 (×4): 40 mg via INTRAVENOUS
  Filled 2021-05-05 (×4): qty 4

## 2021-05-05 MED ORDER — ACETAMINOPHEN 325 MG PO TABS
650.0000 mg | ORAL_TABLET | Freq: Four times a day (QID) | ORAL | Status: DC | PRN
  Administered 2021-05-06 – 2021-05-07 (×2): 650 mg via ORAL
  Filled 2021-05-05 (×2): qty 2

## 2021-05-05 MED ORDER — INSULIN ASPART 100 UNIT/ML IJ SOLN
0.0000 [IU] | Freq: Three times a day (TID) | INTRAMUSCULAR | Status: DC
  Administered 2021-05-07: 2 [IU] via SUBCUTANEOUS

## 2021-05-05 MED ORDER — POTASSIUM CHLORIDE 20 MEQ/15ML (10%) PO SOLN: 6.7000 meq | ORAL | 3 refills | Status: DC

## 2021-05-05 MED ORDER — ASPIRIN 81 MG PO CHEW
81.0000 mg | CHEWABLE_TABLET | Freq: Every morning | ORAL | Status: DC
  Administered 2021-05-06 – 2021-05-08 (×3): 81 mg via ORAL
  Filled 2021-05-05 (×3): qty 1

## 2021-05-05 MED ORDER — FUROSEMIDE 10 MG/ML IJ SOLN
INTRAMUSCULAR | Status: AC
  Administered 2021-05-05: 40 mg via INTRAVENOUS
  Filled 2021-05-05: qty 4

## 2021-05-05 MED ORDER — ACETAMINOPHEN 650 MG RE SUPP: 650.0000 mg | Freq: Four times a day (QID) | RECTAL | Status: DC | PRN

## 2021-05-05 MED ORDER — FUROSEMIDE 10 MG/ML IJ SOLN
40.0000 mg | Freq: Once | INTRAMUSCULAR | Status: AC
  Filled 2021-05-05: qty 4

## 2021-05-05 MED ORDER — FENOFIBRATE 160 MG PO TABS
160.0000 mg | ORAL_TABLET | Freq: Every morning | ORAL | Status: DC
  Administered 2021-05-06 – 2021-05-07 (×2): 160 mg via ORAL
  Filled 2021-05-05 (×2): qty 1

## 2021-05-05 MED ORDER — TERAZOSIN HCL 5 MG PO CAPS
10.0000 mg | ORAL_CAPSULE | Freq: Every morning | ORAL | Status: DC
  Administered 2021-05-06 – 2021-05-08 (×3): 10 mg via ORAL
  Filled 2021-05-05 (×4): qty 2

## 2021-05-05 MED ORDER — ACETAMINOPHEN 500 MG PO TABS: 500.0000 mg | ORAL_TABLET | Freq: Three times a day (TID) | ORAL | Status: DC | PRN

## 2021-05-05 NOTE — Telephone Encounter (Signed)
Okay for refill?  

## 2021-05-05 NOTE — ED Triage Notes (Signed)
Pt arrived by EMS from [facility] complaining of shortness of breath since yesterday.  Pt also has swollen legs.   States she was having abdominal pain yesterday but that has improved.   EMS states FD placed pt on O2 for sats 88%.

## 2021-05-05 NOTE — H&P (Addendum)
History and Physical    Tara Fields NAT:[id number] DOB: September 14, 1927 DOA: 05/05/2021  Referring MD/NP/PA: EDP PCP:  Patient coming from: home, [place], [place]  Chief Complaint: Shortness of breath  HPI: Tara Fields is a [name] with chronic systolic CHF EF 42%, h/o TAVr, PAfib on eliquis, COPD, CAD, CKD 3a was recently admitted with worsening anemia, + hemoccult, underwent EGD which noted erosive gastropathy and colonoscopy -noted diverticulosis, she was transfused 1U PRBC and discharged home on 9/1, apparently after discharge, she was started on torsemide for weight gain by Cardiology, she noticed increased dyspnea, chest tightness and orthopnea since yesterday, symptoms worsened and presented to the ER today, accompanied by her son.  Son reports 2 to 3 pound weight gain in the last 24 hours.  she reports compliance with diuretics, follows salt restricted diet ED Course: When EMS arrived her O2 sats were in the low 80s, she was placed on 4 L O2 via nasal cannula, chest EKG was unremarkable, chest x-ray noted pulmonary edema and pleural effusions bilaterally, high-sensitivity troponin is negative, COVID PCR was negative  Review of Systems: As per HPI otherwise 14 point review of systems negative.   Past Medical History:  Diagnosis Date   Aortic stenosis    s/p TAVR 26 mm Edwards Sapien 3 THV July 2020   CAD (coronary artery disease)    COPD (chronic obstructive pulmonary disease) (HCC)    Diabetes mellitus (HCC)    Diverticulitis    GERD (gastroesophageal reflux disease)    HTN (hypertension)    Hyperlipidemia    Legally blind in left eye, as defined in [place]    Tobacco abuse     Past Surgical History:  Procedure Laterality Date   ABDOMINAL HYSTERECTOMY     APPENDECTOMY     BACK SURGERY     CATARACT EXTRACTION, BILATERAL     CHOLECYSTECTOMY     COLONOSCOPY WITH PROPOFOL N/A 04/16/2021   Procedure: COLONOSCOPY WITH PROPOFOL;  Surgeon: Milus Banister,  MD;  Location: [facility];  Service: Gastroenterology;  Laterality: N/A;   ESOPHAGOGASTRODUODENOSCOPY (EGD) WITH PROPOFOL N/A 04/15/2021   Procedure: ESOPHAGOGASTRODUODENOSCOPY (EGD) WITH PROPOFOL;  Surgeon: Doran Stabler, MD;  Location: [facility];  Service: Gastroenterology;  Laterality: N/A;     reports that she quit smoking about 16 months ago. Her smoking use included cigarettes. She has a 70.00 pack-year smoking history. She has never used smokeless tobacco. She reports that she does not drink alcohol and does not use drugs.  Allergies  Allergen Reactions   Amlodipine Besy-Benazepril Hcl Cough   Hctz [Hydrochlorothiazide] Other (See Comments)    Per family, every diuretic the patient has been on flushes out her out and ultimately results in CRAMPING/JAUNDICE (added potassium might help)   Other Other (See Comments)    History of diverticulitis- CANNOT TOLERATE NUTS, CORN, AND ANY FOODS NOT EASILY DIGESTED- the patient AVOIDS these   Cyclobenzaprine Other (See Comments)    Confusion- does not tolerate well    Diclofenac Sodium Diarrhea   Hydralazine Other (See Comments)    Per family, every diuretic the patient has been on flushes out her out and ultimately results in CRAMPING/JAUNDICE (added potassium might help)   Methylprednisolone Other (See Comments)    Medrol/CORTICOSTEROIDS = A LOT OF ENERGY   Oxycodone Other (See Comments)    Caused patient to "act crazy"    Family History  Problem Relation Age of Onset   Diabetes Mother    Stroke Brother  Cerebral palsy Brother    Coronary artery disease Neg Hx      Prior to Admission medications   Medication Sig Start Date End Date Taking? Authorizing Provider  acetaminophen (TYLENOL) 500 MG tablet Take 500 mg by mouth 3 (three) times daily as needed for headache (pain). Take with tramadol    [provider]  atorvastatin (LIPITOR) 40 MG tablet TAKE 1 TABLET BY MOUTH DAILY AT 6 PM. Patient taking differently:  Take 40 mg by mouth every evening. 03/10/21   Nafziger, Tommi Rumps, NP  cholecalciferol (VITAMIN D3) 25 MCG (1000 UNIT) tablet Take 1,000 Units by mouth 2 (two) times daily.    [provider]  Coenzyme Q10 (CO Q-10) 300 MG CAPS Take 300 mg by mouth in the morning.    [provider]  [facility] ASPIRIN ADULT LOW DOSE 81 MG chewable tablet CHEW 1 TABLET (81 MG TOTAL) BY MOUTH DAILY. Patient taking differently: Chew 81 mg by mouth every morning. 03/10/21   Nafziger, Tommi Rumps, NP  ELIQUIS 5 MG TABS tablet TAKE 1 TABLET BY MOUTH TWICE A DAY Patient taking differently: Take 5 mg by mouth 2 (two) times daily. 03/10/21   Nafziger, Tommi Rumps, NP  fenofibrate 160 MG tablet TAKE 1 TABLET BY MOUTH EVERY DAY Patient taking differently: Take 160 mg by mouth every morning. 03/10/21   Nafziger, Tommi Rumps, NP  ferrous gluconate (FERGON) 324 MG tablet TAKE 1 TABLET BY MOUTH DAILY WITH BREAKFAST Patient taking differently: Take 324 mg by mouth every morning. 03/10/21   Nafziger, Tommi Rumps, NP  Lidocaine-Menthol, Spray, 4-1 % LIQD Apply 1 spray topically daily as needed (back/knee pain/restless legs).    [provider]  losartan (COZAAR) 100 MG tablet TAKE 1 TABLET BY MOUTH EVERY DAY Patient taking differently: Take 100 mg by mouth every morning. 01/14/21   Burnell Blanks, MD  Magnesium 200 MG TABS Take 200 mg by mouth every morning.    [provider]  metoprolol tartrate (LOPRESSOR) 50 MG tablet TAKE 1 TABLET BY MOUTH TWICE A DAY Patient taking differently: Take 50 mg by mouth 2 (two) times daily. 03/10/21   Nafziger, Tommi Rumps, NP  Multiple Vitamin (MULTIVITAMIN WITH MINERALS) TABS tablet Take 1 tablet by mouth every morning.    [provider]  potassium chloride 20 MEQ/15ML (10%) SOLN Take 5 mLs (6.7 mEq total) by mouth See admin instructions. Take 5 mls (6.7 meq) by mouth every morning, take another 5 mls (6.7 meq) with every extra dose of furosemide 05/05/21 08/03/21  Tara Burn, PA-C  sodium  chloride (OCEAN) 0.65 % SOLN nasal spray Place 1 spray into both nostrils daily as needed for congestion.    [provider]  terazosin (HYTRIN) 10 MG capsule Take 1 capsule (10 mg total) by mouth daily at 12 noon. Patient taking differently: Take 10 mg by mouth every evening. 12/16/20   Nafziger, Tommi Rumps, NP  torsemide (DEMADEX) 10 MG tablet Take 1 tablet (10 mg total) by mouth daily. 04/27/21   Tara Burn, PA-C  traMADol (ULTRAM) 50 MG tablet TAKE 1 TABLET (50 MG TOTAL) BY MOUTH IN THE MORNING, AT NOON, AND AT BEDTIME. Patient taking differently: Take 50 mg by mouth 3 (three) times daily as needed (pain/headache). Take with tylenol 03/10/21   Nafziger, Tommi Rumps, NP  vitamin B-12 (CYANOCOBALAMIN) 1000 MCG tablet Take 1 tablet (1,000 mcg total) by mouth daily. 04/16/21   Oswald Hillock, MD    Physical Exam: Vitals:   05/05/21 1624 05/05/21 1625 05/05/21 1737  05/05/21 1854  BP:  (!) 123/103 134/63 136/79  Pulse:  88 81 79  Resp:  16 17 18   Temp: 97.6 F (36.4 C) 97.6 F (36.4 C)    TempSrc: Oral Oral    SpO2:  100% 99% 99%  Weight:      Height:        Elderly frail chronically ill female sitting up in bed, awake alert, oriented x2, no distress HEENT: Positive JVD CVS: S1-S2, regular rhythm, faint systolic murmur Lungs: Few basilar rales, decreased breath sounds Abdomen: Soft, nontender, bowel sounds present Extremities: 1+ edema Skin: No rashes on exposed skin Neuro: Moves all extremities, no localizing signs Psych: Appropriate mood and affect   Labs on Admission: I have personally reviewed following labs and imaging studies  CBC: Recent Labs  Lab 04/29/21 0912 05/05/21 0854  WBC 4.2 5.7  NEUTROABS 3.1 4.8  HGB 9.8* 9.9*  HCT 30.5* 33.4*  MCV 92.8 99.4  PLT 273.0 250   Basic Metabolic Panel: Recent Labs  Lab 04/29/21 0912 05/05/21 0854  NA 139 137  K 3.8 4.1  CL 102 102  CO2 27 23  GLUCOSE 121* 173*  BUN 31* 25*  CREATININE 1.08 1.04*  CALCIUM 9.2 9.0    GFR: Estimated Creatinine Clearance: 30.8 mL/min (A) (by C-G formula based on SCr of 1.04 mg/dL (H)). Liver Function Tests: Recent Labs  Lab 04/29/21 0912  AST 21  ALT 14  ALKPHOS 44  BILITOT 0.7  PROT 5.8*  ALBUMIN 3.6   No results for input(s): LIPASE, AMYLASE in the last 168 hours. No results for input(s): AMMONIA in the last 168 hours. Coagulation Profile: No results for input(s): INR, PROTIME in the last 168 hours. Cardiac Enzymes: No results for input(s): CKTOTAL, CKMB, CKMBINDEX, TROPONINI in the last 168 hours. BNP (last 3 results) No results for input(s): PROBNP in the last 8760 hours. HbA1C: No results for input(s): HGBA1C in the last 72 hours. CBG: No results for input(s): GLUCAP in the last 168 hours. Lipid Profile: No results for input(s): CHOL, HDL, LDLCALC, TRIG, CHOLHDL, LDLDIRECT in the last 72 hours. Thyroid Function Tests: No results for input(s): TSH, T4TOTAL, FREET4, T3FREE, THYROIDAB in the last 72 hours. Anemia Panel: No results for input(s): VITAMINB12, FOLATE, FERRITIN, TIBC, IRON, RETICCTPCT in the last 72 hours. Urine analysis:    Component Value Date/Time   COLORURINE YELLOW 09/01/2020 1403   APPEARANCEUR HAZY (A) 09/01/2020 1403   LABSPEC 1.019 09/01/2020 1403   PHURINE 6.0 09/01/2020 1403   [place] 09/01/2020 1403   GLUCOSEU NEGATIVE 12/11/2019 1033   HGBUR NEGATIVE 09/01/2020 [place] 09/01/2020 1403   KETONESUR NEGATIVE 09/01/2020 1403   PROTEINUR 30 (A) 09/01/2020 1403   UROBILINOGEN 0.2 12/11/2019 1033   NITRITE NEGATIVE 09/01/2020 1403   LEUKOCYTESUR TRACE (A) 09/01/2020 1403   Sepsis Labs: @LABRCNTIP (procalcitonin:4,lacticidven:4) ) Recent Results (from the past 240 hour(s))  Resp Panel by RT-PCR (Flu A&B, Covid) Nasopharyngeal Swab     Status: None   Collection Time: 05/05/21  8:18 AM   Specimen: Nasopharyngeal Swab; Nasopharyngeal(NP) swabs in vial transport medium  Result Value Ref Range  Status   SARS Coronavirus 2 by RT PCR NEGATIVE NEGATIVE Final    Comment: (NOTE) SARS-CoV-2 target nucleic acids are NOT DETECTED.  The SARS-CoV-2 RNA is generally detectable in upper respiratory specimens during the acute phase of infection. The lowest concentration of SARS-CoV-2 viral copies this assay can detect is 138 copies/mL. A negative result does not  preclude SARS-Cov-2 infection and should not be used as the sole basis for treatment or other patient management decisions. A negative result may occur with  improper specimen collection/handling, submission of specimen other than nasopharyngeal swab, presence of viral mutation(s) within the areas targeted by this assay, and inadequate number of viral copies(<138 copies/mL). A negative result must be combined with clinical observations, patient history, and epidemiological information. The expected result is Negative.  Fact Sheet for Patients:  EntrepreneurPulse.com.au  Fact Sheet for Healthcare Providers:  IncredibleEmployment.be  This test is no t yet approved or cleared by the [place] FDA and  has been authorized for detection and/or diagnosis of SARS-CoV-2 by FDA under an Emergency Use Authorization (EUA). This EUA will remain  in effect (meaning this test can be used) for the duration of the COVID-19 declaration under Section 564(b)(1) of the Act, 21 U.S.C.section 360bbb-3(b)(1), unless the authorization is terminated  or revoked sooner.       Influenza A by PCR NEGATIVE NEGATIVE Final   Influenza B by PCR NEGATIVE NEGATIVE Final    Comment: (NOTE) The Xpert Xpress SARS-CoV-2/FLU/RSV plus assay is intended as an aid in the diagnosis of influenza from Nasopharyngeal swab specimens and should not be used as a sole basis for treatment. Nasal washings and aspirates are unacceptable for Xpert Xpress SARS-CoV-2/FLU/RSV testing.  Fact Sheet for  Patients: EntrepreneurPulse.com.au  Fact Sheet for Healthcare Providers: IncredibleEmployment.be  This test is not yet approved or cleared by the [place] FDA and has been authorized for detection and/or diagnosis of SARS-CoV-2 by FDA under an Emergency Use Authorization (EUA). This EUA will remain in effect (meaning this test can be used) for the duration of the COVID-19 declaration under Section 564(b)(1) of the Act, 21 U.S.C. section 360bbb-3(b)(1), unless the authorization is terminated or revoked.  Performed at [facility], [place] [address] [place], [place] [address]      Radiological Exams on Admission: DG Chest 2 View  Result Date: 05/05/2021 CLINICAL DATA:  Shortness of breath. EXAM: CHEST - 2 VIEW COMPARISON:  January 30, 2021. FINDINGS: Small bilateral pleural effusions. No visible pneumothorax. Diffuse interstitial opacities. Chronic more focal streaky opacities in the left upper lobe/suprahilar region. Mild enlargement of the cardiac silhouette. Aortic valve prosthesis. Calcific atherosclerosis of the aorta. Polyarticular degenerative change and osteopenia. Prior left rib fractures better characterized on prior CT. IMPRESSION: 1. Small bilateral pleural effusions, mild cardiomegaly and diffuse interstitial opacities suggestive of interstitial pulmonary edema. 2. Chronic streaky left upper lobe/suprahilar opacity, characterized as radiation fibrosis on prior CT. Electronically Signed   By: Margaretha Sheffield M.D.   On: 05/05/2021 09:22    EKG: Independently reviewed. Assessment/Plan Active Problems:  Acute hypoxic respiratory failure Acute on chronic systolic CHF -Known cardiomyopathy, EF of 25% -Presenting with fluid overload, hypoxia, orthopnea, HD troponin negative, Hb stable -diurese with IV Lasix 40 mg every 12 -Continue beta-blocker, potassium -Hold losartan today to allow room for diuresis, blood pressure soft at the time  of my assessment -Monitor urine output, daily weights -BMP in a.m.  Subacute anemia -Heme positive stools, recent work-up notable for erosive gastropathy and diverticulosis -Hemoglobin is stable, continue PPI, Eliquis  Paroxysmal atrial fibrillation -In sinus rhythm at this time, continue beta-blocker and Eliquis  CKD 3 A -Creatinine stable at baseline  COPD -Stable  Diabetes mellitus -Diet controlled, not on meds -Add sliding scale insulin  DVT prophylaxis: On Eliquis Code Status: DNR, discussed with patient, son at bedside Family Communication: Discussed with  son at bedside Disposition Plan: Home pending clinical improvement Consults called: None Admission status: Inpatient on account of hypoxia, fluid overloaded state in an elderly frail patient, needs close monitoring of kidney function, response to treatment  Domenic Polite MD [facility]   05/05/2021, 7:08 PM

## 2021-05-05 NOTE — ED Provider Notes (Signed)
Emergency Medicine Provider Triage Evaluation Note  Tara Fields , a 87 y.o. female  was evaluated in triage.  Pt complains of SOB. Hx of CHF, on diuretics. Initially hypoxic with fire. Some increased LE edema. No pain, cough. No known COVID exposures  Review of Systems  Positive: Sob, le edema Negative:   Physical Exam  BP 131/62 (BP Location: Left Arm)   Pulse 91   Temp 98.4 F (36.9 C) (Oral)   Resp 18   SpO2 99%  Gen:   Awake, no distress   Resp:  Normal effort, mild crackles at bases MSK:   Moves extremities without difficulty. Bl pitting edema, L>R Other:    Medical Decision Making  Medically screening exam initiated at 8:19 AM.  Appropriate orders placed.  Tara Fields was informed that the remainder of the evaluation will be completed by another provider, this initial triage assessment does not replace that evaluation, and the importance of remaining in the ED until their evaluation is complete.  SOB, LE edema   Alexsia Klindt A, PA-C 05/05/21 0826    Truddie Hidden, MD 05/05/21 1014

## 2021-05-05 NOTE — ED Provider Notes (Signed)
[facility] DEPARTMENT Provider Note   CSN: [id number] Arrival date & time: 05/05/21  0932     History Chief Complaint  Patient presents with   Shortness of Breath    Tara Fields is a 87 y.o. female.  The history is provided by the patient and medical records. No language interpreter was used.  Shortness of Breath Severity:  Severe Onset quality:  Gradual Duration:  2 days Timing:  Constant Progression:  Worsening Chronicity:  Recurrent Context: not URI   Relieved by:  Nothing Worsened by:  Exertion Ineffective treatments:  None tried Associated symptoms: no abdominal pain, no chest pain, no cough, no diaphoresis, no fever, no headaches, no neck pain, no rash, no sputum production, no vomiting and no wheezing       Past Medical History:  Diagnosis Date   Aortic stenosis    s/p TAVR 26 mm Edwards Sapien 3 THV July 2020   CAD (coronary artery disease)    COPD (chronic obstructive pulmonary disease) ([place])    Diabetes mellitus ([place])    Diverticulitis    GERD (gastroesophageal reflux disease)    HTN (hypertension)    Hyperlipidemia    Legally blind in left eye, as defined in [place]    Tobacco abuse     Patient Active Problem List   Diagnosis Date Noted   GI bleed 04/13/2021   Anemia 04/13/2021   Symptomatic anemia 04/13/2021   Prolonged QT interval 04/13/2021   CKD (chronic kidney disease), stage III ([place]) 04/13/2021   Acute on chronic systolic CHF (congestive heart failure) ([place]) 01/29/2021   Cognitive deficits    Pressure injury of skin 12/19/2020   Acute exacerbation of CHF (congestive heart failure) ([place]) 12/18/2020   COPD (chronic obstructive pulmonary disease) ([place]) 09/02/2020   Dyslipidemia 09/02/2020   Acute respiratory failure with hypoxia ([place]) 09/01/2020   Pulmonary nodule 1 cm or greater in diameter 01/03/2020   S/P TAVR (transcatheter aortic valve replacement) 05/03/2019   Primary osteoarthritis involving multiple  joints 05/03/2019   Other persistent atrial fibrillation ([place]) 05/03/2019   Bilateral carotid artery stenosis 12/01/2017   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/13/2014   Aortic valve disorder 05/03/2014   Chronic obstructive bronchitis without exacerbation ([place]) 05/03/2014   Derangement of left knee 05/03/2014   Esophageal reflux 05/03/2014   Essential hypertension 05/03/2014   Pure hypercholesterolemia 05/03/2014   Type II diabetes mellitus ([place]) 05/03/2014   Vitamin D deficiency 05/03/2014    Past Surgical History:  Procedure Laterality Date   ABDOMINAL HYSTERECTOMY     APPENDECTOMY     BACK SURGERY     CATARACT EXTRACTION, BILATERAL     CHOLECYSTECTOMY     COLONOSCOPY WITH PROPOFOL N/A 04/16/2021   Procedure: COLONOSCOPY WITH PROPOFOL;  Surgeon: Milus Banister, MD;  Location: [facility];  Service: Gastroenterology;  Laterality: N/A;   ESOPHAGOGASTRODUODENOSCOPY (EGD) WITH PROPOFOL N/A 04/15/2021   Procedure: ESOPHAGOGASTRODUODENOSCOPY (EGD) WITH PROPOFOL;  Surgeon: Doran Stabler, MD;  Location: [facility];  Service: Gastroenterology;  Laterality: N/A;     OB History     Gravida  2   Para  2   Term      Preterm      AB      Living         SAB      IAB      Ectopic      Multiple      Live Births  Family History  Problem Relation Age of Onset   Diabetes Mother    Stroke Brother    Cerebral palsy Brother    Coronary artery disease Neg Hx     Social History   Tobacco Use   Smoking status: Former    Packs/day: 1.00    Years: 70.00    Pack years: 70.00    Types: Cigarettes    Quit date: 12/15/2019    Years since quitting: 1.3   Smokeless tobacco: Never   Tobacco comments:    Pt states quiting a year ago. 01/03/20 ARJ  Vaping Use   Vaping Use: Never used  Substance Use Topics   Alcohol use: No    Alcohol/week: 0.0 standard drinks   Drug use: No    Home Medications Prior to Admission medications   Medication Sig Start  Date End Date Taking? Authorizing Provider  acetaminophen (TYLENOL) 500 MG tablet Take 500 mg by mouth 3 (three) times daily as needed for headache (pain). Take with tramadol    [provider]  atorvastatin (LIPITOR) 40 MG tablet TAKE 1 TABLET BY MOUTH DAILY AT 6 PM. Patient taking differently: Take 40 mg by mouth every evening. 03/10/21   Nafziger, Tommi Rumps, NP  cholecalciferol (VITAMIN D3) 25 MCG (1000 UNIT) tablet Take 1,000 Units by mouth 2 (two) times daily.    [provider]  Coenzyme Q10 (CO Q-10) 300 MG CAPS Take 300 mg by mouth in the morning.    [provider]  [facility] ASPIRIN ADULT LOW DOSE 81 MG chewable tablet CHEW 1 TABLET (81 MG TOTAL) BY MOUTH DAILY. Patient taking differently: Chew 81 mg by mouth every morning. 03/10/21   Nafziger, Tommi Rumps, NP  ELIQUIS 5 MG TABS tablet TAKE 1 TABLET BY MOUTH TWICE A DAY Patient taking differently: Take 5 mg by mouth 2 (two) times daily. 03/10/21   Nafziger, Tommi Rumps, NP  fenofibrate 160 MG tablet TAKE 1 TABLET BY MOUTH EVERY DAY Patient taking differently: Take 160 mg by mouth every morning. 03/10/21   Nafziger, Tommi Rumps, NP  ferrous gluconate (FERGON) 324 MG tablet TAKE 1 TABLET BY MOUTH DAILY WITH BREAKFAST Patient taking differently: Take 324 mg by mouth every morning. 03/10/21   Nafziger, Tommi Rumps, NP  Lidocaine-Menthol, Spray, 4-1 % LIQD Apply 1 spray topically daily as needed (back/knee pain/restless legs).    [provider]  losartan (COZAAR) 100 MG tablet TAKE 1 TABLET BY MOUTH EVERY DAY Patient taking differently: Take 100 mg by mouth every morning. 01/14/21   Burnell Blanks, MD  Magnesium 200 MG TABS Take 200 mg by mouth every morning.    [provider]  metoprolol tartrate (LOPRESSOR) 50 MG tablet TAKE 1 TABLET BY MOUTH TWICE A DAY Patient taking differently: Take 50 mg by mouth 2 (two) times daily. 03/10/21   Nafziger, Tommi Rumps, NP  Multiple Vitamin (MULTIVITAMIN WITH MINERALS) TABS tablet Take 1 tablet by  mouth every morning.    [provider]  potassium chloride 20 MEQ/15ML (10%) SOLN Take 5 mLs (6.7 mEq total) by mouth See admin instructions. Take 5 mls (6.7 meq) by mouth every morning, take another 5 mls (6.7 meq) with every extra dose of furosemide 05/05/21 08/03/21  [name], PA-C  sodium chloride (OCEAN) 0.65 % SOLN nasal spray Place 1 spray into both nostrils daily as needed for congestion.    [provider]  terazosin (HYTRIN) 10 MG capsule Take 1 capsule (10 mg total) by mouth daily at 12 noon. Patient taking  differently: Take 10 mg by mouth every evening. 12/16/20   Nafziger, Tommi Rumps, NP  torsemide (DEMADEX) 10 MG tablet Take 1 tablet (10 mg total) by mouth daily. 04/27/21   [name], PA-C  traMADol (ULTRAM) 50 MG tablet TAKE 1 TABLET (50 MG TOTAL) BY MOUTH IN THE MORNING, AT NOON, AND AT BEDTIME. Patient taking differently: Take 50 mg by mouth 3 (three) times daily as needed (pain/headache). Take with tylenol 03/10/21   Nafziger, Tommi Rumps, NP  vitamin B-12 (CYANOCOBALAMIN) 1000 MCG tablet Take 1 tablet (1,000 mcg total) by mouth daily. 04/16/21   Oswald Hillock, MD    Allergies    Amlodipine besy-benazepril hcl, Hctz [hydrochlorothiazide], Other, Cyclobenzaprine, Diclofenac sodium, Hydralazine, Methylprednisolone, and Oxycodone  Review of Systems   Review of Systems  Constitutional:  Positive for fatigue. Negative for chills, diaphoresis and fever.  HENT:  Negative for congestion.   Respiratory:  Positive for chest tightness and shortness of breath. Negative for cough, sputum production, wheezing and stridor.   Cardiovascular:  Positive for leg swelling. Negative for chest pain and palpitations.  Gastrointestinal:  Negative for abdominal pain, constipation, diarrhea, nausea and vomiting.  Genitourinary:  Negative for flank pain.  Musculoskeletal:  Negative for back pain, neck pain and neck stiffness.  Skin:  Negative for rash and wound.  Neurological:  Negative  for weakness, light-headedness, numbness and headaches.  Psychiatric/Behavioral:  Negative for agitation.   All other systems reviewed and are negative.  Physical Exam Updated Vital Signs BP (!) 123/103 (BP Location: Right Arm)   Pulse 88   Temp 97.6 F (36.4 C) (Oral)   Resp 16   Ht 5' (1.524 m)   Wt 69.9 kg   SpO2 100%   BMI 30.08 kg/m   Physical Exam Vitals and nursing note reviewed.  Constitutional:      General: She is not in acute distress.    Appearance: She is well-developed. She is not ill-appearing, toxic-appearing or diaphoretic.  HENT:     Head: Normocephalic and atraumatic.     Mouth/Throat:     Mouth: Mucous membranes are moist.  Eyes:     Extraocular Movements: Extraocular movements intact.     Conjunctiva/sclera: Conjunctivae normal.     Pupils: Pupils are equal, round, and reactive to light.  Cardiovascular:     Rate and Rhythm: Normal rate and regular rhythm.     Pulses: Normal pulses.     Heart sounds: Murmur heard.  Pulmonary:     Effort: Pulmonary effort is normal. Tachypnea present. No respiratory distress.     Breath sounds: Rales present. No wheezing or rhonchi.  Chest:     Chest wall: No tenderness.  Abdominal:     Palpations: Abdomen is soft.     Tenderness: There is no abdominal tenderness.  Musculoskeletal:     Cervical back: Neck supple.     Right lower leg: No tenderness. Edema present.     Left lower leg: No tenderness. Edema present.  Skin:    General: Skin is warm and dry.     Capillary Refill: Capillary refill takes less than 2 seconds.  Neurological:     General: No focal deficit present.     Mental Status: She is alert.  Psychiatric:        Mood and Affect: Mood normal.    ED Results / Procedures / Treatments   Labs (all labs ordered are listed, but only abnormal results are displayed) Labs Reviewed  CBC WITH DIFFERENTIAL/PLATELET - Abnormal;  Notable for the following components:      Result Value   RBC 3.36 (*)     Hemoglobin 9.9 (*)    HCT 33.4 (*)    MCHC 29.6 (*)    RDW 16.4 (*)    Lymphs Abs 0.4 (*)    All other components within normal limits  BASIC METABOLIC PANEL - Abnormal; Notable for the following components:   Glucose, Bld 173 (*)    BUN 25 (*)    Creatinine, Ser 1.04 (*)    GFR, Estimated 51 (*)    All other components within normal limits  BRAIN NATRIURETIC PEPTIDE - Abnormal; Notable for the following components:   B Natriuretic Peptide 2,584.5 (*)    All other components within normal limits  RESP PANEL BY RT-PCR (FLU A&B, COVID) ARPGX2  TROPONIN I (HIGH SENSITIVITY)  TROPONIN I (HIGH SENSITIVITY)    EKG EKG Interpretation  Date/Time:  Tuesday May 05 2021 08:14:04 EDT Ventricular Rate:  90 PR Interval:    QRS Duration: 156 QT Interval:  406 QTC Calculation: 496 R Axis:   111 Text Interpretation: Atrial fibrillation with premature ventricular or aberrantly conducted complexes Right axis deviation Left bundle branch block ST-t wave abnormality Artifact Abnormal ECG Confirmed by Carmin Muskrat [phone]) on 05/05/2021 8:41:45 AM  Radiology DG Chest 2 View  Result Date: 05/05/2021 CLINICAL DATA:  Shortness of breath. EXAM: CHEST - 2 VIEW COMPARISON:  January 30, 2021. FINDINGS: Small bilateral pleural effusions. No visible pneumothorax. Diffuse interstitial opacities. Chronic more focal streaky opacities in the left upper lobe/suprahilar region. Mild enlargement of the cardiac silhouette. Aortic valve prosthesis. Calcific atherosclerosis of the aorta. Polyarticular degenerative change and osteopenia. Prior left rib fractures better characterized on prior CT. IMPRESSION: 1. Small bilateral pleural effusions, mild cardiomegaly and diffuse interstitial opacities suggestive of interstitial pulmonary edema. 2. Chronic streaky left upper lobe/suprahilar opacity, characterized as radiation fibrosis on prior CT. Electronically Signed   By: Margaretha Sheffield M.D.   On: 05/05/2021 09:22     Procedures Procedures   Medications Ordered in ED Medications  furosemide (LASIX) injection 40 mg (has no administration in time range)    ED Course  I have reviewed the triage vital signs and the nursing notes.  Pertinent labs & imaging results that were available during my care of the patient were reviewed by me and considered in my medical decision making (see chart for details).    MDM Rules/Calculators/A&P                           Tara Fields is a 87 y.o. female with a past medical history significant for aortic valve stenosis status post TAVR, CHF on torsemide for diuresis, carotid stenosis, diabetes, hypertension, hypercholesterolemia, dyslipidemia, COPD, previous GI bleed, atrial fibrillation on Eliquis, and CKD who presents with hypoxia, shortness of breath, and worsening edema.  Patient reports that for the last 40 hours, she is in worsening edema in both of her legs and weight gain.  She reports she is gained 2 pounds in the last 24 hours.  She says that she is very on top of her fluid overload and reports it is worsened.  She reports exertional and now resting shortness of breath and was found by first responders to have oxygen saturations in the 80s.  She does not take oxygen at home.  She is on 4 L during my initial evaluation.  She is reporting some chest tightness but denies  actual chest pressure centrally.  She reports no constant nausea or vomiting and denies any constipation, diarrhea, or significant urinary changes aside above some frequency with her diuretic.  She denies any trauma.  She denies any new cough, congestion, or other infectious symptoms.  She is unable to lay flat.  On exam, lungs have rales in the bases but chest is nontender.  Murmurs appreciated.  Abdomen is nontender.  Legs are edematous bilaterally but are nontender.  Good pulses in extremities.  Flank and back nontender.  Abdomen otherwise nontender.  EKG did not show STEMI.  As she is now  on oxygen which she does not take at home, patient will need admission for diuresis.  Chest x-ray confirmed pulmonary edema and pleural effusions bilaterally which is consistent with exam and story.  Her kidney function is not significantly more elevated.  Troponin negative x2.  COVID and flu negative.  Patient will need admission for diuresis.  I spoke with hospitalist who requested 40 of Lasix IV to start and they will see her for admission and further management.  Patient will be admitted by medicine.  Final Clinical Impression(s) / ED Diagnoses Final diagnoses:  Hypoxia  SOB (shortness of breath)  Hypervolemia, unspecified hypervolemia type    Clinical Impression: 1. Hypoxia   2. SOB (shortness of breath)   3. Hypervolemia, unspecified hypervolemia type     Disposition: Admit  This note was prepared with assistance of Dragon voice recognition software. Occasional wrong-word or sound-a-like substitutions may have occurred due to the inherent limitations of voice recognition software.     Alter Moss, Gwenyth Allegra, MD 05/05/21 [phone]

## 2021-05-05 NOTE — Progress Notes (Signed)
   05/05/21 2123  Vitals  Temp 97.6 F (36.4 C)  Temp Source Oral  BP 132/66  MAP (mmHg) 86  BP Location Left Arm  BP Method Automatic  Patient Position (if appropriate) Lying  Pulse Rate 85  Pulse Rate Source Monitor  Resp 20  Level of Consciousness  Level of Consciousness Alert  MEWS COLOR  MEWS Score Color Green  Oxygen Therapy  SpO2 97 %  O2 Device Nasal Cannula  O2 Flow Rate (L/min) 2 L/min  Pain Assessment  Pain Scale 0-10  Pain Score 0  MEWS Score  MEWS Temp 0  MEWS Systolic 0  MEWS Pulse 0  MEWS RR 0  MEWS LOC 0  MEWS Score 0  Admitted pt to rm 3E14 from ED, pt alert and oriented x 4, denies pain, oriented to room, call bell placed within reach, placed on cardiac monitor, CCMD made aware.

## 2021-05-06 ENCOUNTER — Other Ambulatory Visit: Payer: Medicare Other

## 2021-05-06 DIAGNOSIS — I5023 Acute on chronic systolic (congestive) heart failure: Secondary | ICD-10-CM | POA: Diagnosis not present

## 2021-05-06 LAB — BASIC METABOLIC PANEL
Anion gap: 13 (ref 5–15)
Anion gap: 10 (ref 5–15)
BUN: 24 mg/dL — ABNORMAL HIGH (ref 8–23)
BUN: 28 mg/dL — ABNORMAL HIGH (ref 8–23)
CO2: 26 mmol/L (ref 22–32)
CO2: 27 mmol/L (ref 22–32)
Calcium: 9.2 mg/dL (ref 8.9–10.3)
Calcium: 8.8 mg/dL — ABNORMAL LOW (ref 8.9–10.3)
Chloride: 103 mmol/L (ref 98–111)
Chloride: 101 mmol/L (ref 98–111)
Creatinine, Ser: 0.96 mg/dL (ref 0.44–1.00)
Creatinine, Ser: 0.95 mg/dL (ref 0.44–1.00)
GFR, Estimated: 56 mL/min — ABNORMAL LOW (ref >60)
GFR, Estimated: 57 mL/min — ABNORMAL LOW (ref >60)
Glucose, Bld: 139 mg/dL — ABNORMAL HIGH (ref 70–99)
Glucose, Bld: 123 mg/dL — ABNORMAL HIGH (ref 70–99)
Potassium: 3.8 mmol/L (ref 3.5–5.1)
Potassium: 3.3 mmol/L — ABNORMAL LOW (ref 3.5–5.1)
Sodium: 142 mmol/L (ref 135–145)
Sodium: 138 mmol/L (ref 135–145)

## 2021-05-06 LAB — CBC
HCT: 31.5 % — ABNORMAL LOW (ref 36.0–46.0)
Hemoglobin: 9.9 g/dL — ABNORMAL LOW (ref 12.0–15.0)
MCH: 30.2 pg (ref 26.0–34.0)
MCHC: 31.4 g/dL (ref 30.0–36.0)
MCV: 96 fL (ref 80.0–100.0)
Platelets: 275 10*3/uL (ref 150–400)
RBC: 3.28 MIL/uL — ABNORMAL LOW (ref 3.87–5.11)
RDW: 16.4 % — ABNORMAL HIGH (ref 11.5–15.5)
WBC: 5.6 10*3/uL (ref 4.0–10.5)
nRBC: 0 % (ref 0.0–0.2)

## 2021-05-06 LAB — GLUCOSE, CAPILLARY
Glucose-Capillary: 161 mg/dL — ABNORMAL HIGH (ref 70–99)
Glucose-Capillary: 119 mg/dL — ABNORMAL HIGH (ref 70–99)
Glucose-Capillary: 118 mg/dL — ABNORMAL HIGH (ref 70–99)
Glucose-Capillary: 119 mg/dL — ABNORMAL HIGH (ref 70–99)

## 2021-05-06 LAB — MAGNESIUM: Magnesium: 1.8 mg/dL (ref 1.7–2.4)

## 2021-05-06 MED ORDER — LOSARTAN POTASSIUM 25 MG PO TABS
25.0000 mg | ORAL_TABLET | Freq: Every day | ORAL | Status: DC
  Administered 2021-05-06 – 2021-05-08 (×3): 25 mg via ORAL
  Filled 2021-05-06 (×3): qty 1

## 2021-05-06 NOTE — Plan of Care (Signed)
  Problem: Education: Goal: Knowledge of General Education information will improve Description Including pain rating scale, medication(s)/side effects and non-pharmacologic comfort measures Outcome: Progressing   Problem: Health Behavior/Discharge Planning: Goal: Ability to manage health-related needs will improve Outcome: Progressing   

## 2021-05-06 NOTE — Progress Notes (Signed)
PROGRESS NOTE    Damaya Channing  GNF:[id number] DOB: [birth year] DOA: 05/05/2021 PCP: [name], NP  Brief Narrative: Johnita Palleschi is a [name] with chronic systolic CHF EF 84%, h/o TAVR, PAfib on eliquis, COPD, CAD, CKD 3a was recently admitted with worsening anemia, + hemoccult, underwent EGD which noted erosive gastropathy and colonoscopy -noted diverticulosis, she was transfused 1U PRBC and discharged home on 9/1, apparently after discharge, she was started on torsemide for weight gain by Cardiology, she noticed increased dyspnea, chest tightness and orthopnea since yesterday, symptoms worsened and presented to the ER 9/20, accompanied by her son.  Son reports 2 to 3 pound weight gain in the last 24 hours.  she reports compliance with diuretics, follows salt restricted diet ED Course: When EMS arrived her O2 sats were in the low 80s, she was placed on 4 L O2 via nasal cannula, chest EKG was unremarkable, chest x-ray noted pulmonary edema and pleural effusions bilaterally, high-sensitivity troponin is negative  Assessment & Plan:   Acute hypoxic respiratory failure Acute on chronic systolic CHF -Known cardiomyopathy, EF of 25% -Presented with fluid overload, hypoxia, orthopnea, HS troponin negative, Hb stable -Starting to improve, she is 1.8 L negative, continue IV Lasix 40 mg every 12 with potassium  -Continue home regimen of beta-blocker  -Restart losartan at a lower dose, she is followed by cardiology, not on Aldactone or Entresto at baseline -Monitor urine output, daily weights -BMP in a.m. -Ambulate, PT OT eval   Subacute anemia -Heme positive stools, recent work-up notable for erosive gastropathy and diverticulosis -Hemoglobin is stable, continue PPI, Eliquis   Paroxysmal atrial fibrillation -In sinus rhythm at this time, continue beta-blocker and Eliquis  H/o Severe As s/p TAVR at [place] in 02/2019   CKD 3 A -Creatinine stable at baseline   COPD -Stable   Diabetes  mellitus -Diet controlled, not on meds -Add sliding scale insulin   DVT prophylaxis: On Eliquis Code Status: DNR, discussed with patient, son at bedside Family Communication: Discussed with son yesterday Disposition Plan:  Status is: Inpatient  Remains inpatient appropriate because:Inpatient level of care appropriate due to severity of illness  Dispo: The patient is from: Home              Anticipated d/c is to: Home              Patient currently is not medically stable to d/c.   Difficult to place patient No    Procedures:   Antimicrobials:    Subjective: -Breathing starting to improve, still dyspneic with minimal exertion  Objective: Vitals:   05/05/21 2118 05/05/21 2123 05/06/21 0448 05/06/21 0900  BP:  132/66 (!) 143/76 (!) 121/52  Pulse:  85 74 81  Resp:  20 20 18   Temp:  97.6 F (36.4 C) 98.4 F (36.9 C) 97.8 F (36.6 C)  TempSrc:  Oral Oral Oral  SpO2:  97% 98% 98%  Weight: 67.5 kg  66.7 kg   Height: 5' (1.524 m)       Intake/Output Summary (Last 24 hours) at 05/06/2021 1038 Last data filed at 05/06/2021 0456 Gross per 24 hour  Intake 120 ml  Output 1500 ml  Net -1380 ml   Filed Weights   05/05/21 0827 05/05/21 2118 05/06/21 0448  Weight: 69.9 kg 67.5 kg 66.7 kg    Examination:  General exam: Chronically ill pleasant elderly female, sitting up in bed, awake alert oriented x3, no distress HEENT: Positive JVD CVS: S1-S2, regular rhythm, faint systolic murmur  Lungs: Bilateral Rales in lower lobes Abdomen: Soft, nontender, mildly distended, bowel sounds present Extremities: Trace edema today  Skin: No rashes Psychiatry: Mood & affect appropriate.     Data Reviewed:   CBC: Recent Labs  Lab 05/05/21 0854 05/06/21 0349  WBC 5.7 5.6  NEUTROABS 4.8  --   HGB 9.9* 9.9*  HCT 33.4* 31.5*  MCV 99.4 96.0  PLT 292 332   Basic Metabolic Panel: Recent Labs  Lab 05/05/21 0854 05/06/21 0349  NA 137 142  K 4.1 3.8  CL 102 103  CO2 23 26   GLUCOSE 173* 139*  BUN 25* 24*  CREATININE 1.04* 0.96  CALCIUM 9.0 9.2   GFR: Estimated Creatinine Clearance: 32.5 mL/min (by C-G formula based on SCr of 0.96 mg/dL). Liver Function Tests: No results for input(s): AST, ALT, ALKPHOS, BILITOT, PROT, ALBUMIN in the last 168 hours. No results for input(s): LIPASE, AMYLASE in the last 168 hours. No results for input(s): AMMONIA in the last 168 hours. Coagulation Profile: No results for input(s): INR, PROTIME in the last 168 hours. Cardiac Enzymes: No results for input(s): CKTOTAL, CKMB, CKMBINDEX, TROPONINI in the last 168 hours. BNP (last 3 results) No results for input(s): PROBNP in the last 8760 hours. HbA1C: No results for input(s): HGBA1C in the last 72 hours. CBG: Recent Labs  Lab 05/05/21 2151 05/06/21 0608  GLUCAP 123* 161*   Lipid Profile: No results for input(s): CHOL, HDL, LDLCALC, TRIG, CHOLHDL, LDLDIRECT in the last 72 hours. Thyroid Function Tests: No results for input(s): TSH, T4TOTAL, FREET4, T3FREE, THYROIDAB in the last 72 hours. Anemia Panel: No results for input(s): VITAMINB12, FOLATE, FERRITIN, TIBC, IRON, RETICCTPCT in the last 72 hours. Urine analysis:    Component Value Date/Time   COLORURINE YELLOW 09/01/2020 1403   APPEARANCEUR HAZY (A) 09/01/2020 1403   LABSPEC 1.019 09/01/2020 1403   PHURINE 6.0 09/01/2020 1403   [place] 09/01/2020 1403   GLUCOSEU NEGATIVE 12/11/2019 1033   HGBUR NEGATIVE 09/01/2020 [place] 09/01/2020 1403   KETONESUR NEGATIVE 09/01/2020 1403   PROTEINUR 30 (A) 09/01/2020 1403   UROBILINOGEN 0.2 12/11/2019 1033   NITRITE NEGATIVE 09/01/2020 1403   LEUKOCYTESUR TRACE (A) 09/01/2020 1403   Sepsis Labs: @LABRCNTIP (procalcitonin:4,lacticidven:4)  ) Recent Results (from the past 240 hour(s))  Resp Panel by RT-PCR (Flu A&B, Covid) Nasopharyngeal Swab     Status: None   Collection Time: 05/05/21  8:18 AM   Specimen: Nasopharyngeal Swab;  Nasopharyngeal(NP) swabs in vial transport medium  Result Value Ref Range Status   SARS Coronavirus 2 by RT PCR NEGATIVE NEGATIVE Final    Comment: (NOTE) SARS-CoV-2 target nucleic acids are NOT DETECTED.  The SARS-CoV-2 RNA is generally detectable in upper respiratory specimens during the acute phase of infection. The lowest concentration of SARS-CoV-2 viral copies this assay can detect is 138 copies/mL. A negative result does not preclude SARS-Cov-2 infection and should not be used as the sole basis for treatment or other patient management decisions. A negative result may occur with  improper specimen collection/handling, submission of specimen other than nasopharyngeal swab, presence of viral mutation(s) within the areas targeted by this assay, and inadequate number of viral copies(<138 copies/mL). A negative result must be combined with clinical observations, patient history, and epidemiological information. The expected result is Negative.  Fact Sheet for Patients:  EntrepreneurPulse.com.au  Fact Sheet for Healthcare Providers:  IncredibleEmployment.be  This test is no t yet approved or cleared by the [place] and  has been authorized for detection and/or diagnosis of SARS-CoV-2 by FDA under an Emergency Use Authorization (EUA). This EUA will remain  in effect (meaning this test can be used) for the duration of the COVID-19 declaration under Section 564(b)(1) of the Act, 21 U.S.C.section 360bbb-3(b)(1), unless the authorization is terminated  or revoked sooner.       Influenza A by PCR NEGATIVE NEGATIVE Final   Influenza B by PCR NEGATIVE NEGATIVE Final    Comment: (NOTE) The Xpert Xpress SARS-CoV-2/FLU/RSV plus assay is intended as an aid in the diagnosis of influenza from Nasopharyngeal swab specimens and should not be used as a sole basis for treatment. Nasal washings and aspirates are unacceptable for Xpert Xpress  SARS-CoV-2/FLU/RSV testing.  Fact Sheet for Patients: EntrepreneurPulse.com.au  Fact Sheet for Healthcare Providers: IncredibleEmployment.be  This test is not yet approved or cleared by the [place] FDA and has been authorized for detection and/or diagnosis of SARS-CoV-2 by FDA under an Emergency Use Authorization (EUA). This EUA will remain in effect (meaning this test can be used) for the duration of the COVID-19 declaration under Section 564(b)(1) of the Act, 21 U.S.C. section 360bbb-3(b)(1), unless the authorization is terminated or revoked.  Performed at [facility], [place] [address], [place], [place] [address]          Radiology Studies: DG Chest 2 View  Result Date: 05/05/2021 CLINICAL DATA:  Shortness of breath. EXAM: CHEST - 2 VIEW COMPARISON:  January 30, 2021. FINDINGS: Small bilateral pleural effusions. No visible pneumothorax. Diffuse interstitial opacities. Chronic more focal streaky opacities in the left upper lobe/suprahilar region. Mild enlargement of the cardiac silhouette. Aortic valve prosthesis. Calcific atherosclerosis of the aorta. Polyarticular degenerative change and osteopenia. Prior left rib fractures better characterized on prior CT. IMPRESSION: 1. Small bilateral pleural effusions, mild cardiomegaly and diffuse interstitial opacities suggestive of interstitial pulmonary edema. 2. Chronic streaky left upper lobe/suprahilar opacity, characterized as radiation fibrosis on prior CT. Electronically Signed   By: Margaretha Sheffield M.D.   On: 05/05/2021 09:22        Scheduled Meds:  apixaban  5 mg Oral BID   aspirin  81 mg Oral q morning   atorvastatin  40 mg Oral QPM   fenofibrate  160 mg Oral q morning   furosemide  40 mg Intravenous Q12H   insulin aspart  0-9 Units Subcutaneous TID WC   metoprolol tartrate  50 mg Oral BID   potassium chloride  20 mEq Oral BID   terazosin  10 mg Oral q AM   Continuous  Infusions:   LOS: 1 day    Time spent: 43min  Domenic Polite, MD [facility]   05/06/2021, 10:38 AM

## 2021-05-06 NOTE — Progress Notes (Signed)
Heart Failure Nurse Navigator Progress Note  Following this admission. Pt was assessed during hospitalization 12/2020, did not need HV TOC clinic at discharge.   Pt currently sleeping with [place] on. Pt has not been the strongest historian, no family present currently.   Pricilla Holm, MSN, RN Heart Failure Nurse Navigator [phone]

## 2021-05-07 DIAGNOSIS — I2510 Atherosclerotic heart disease of native coronary artery without angina pectoris: Secondary | ICD-10-CM

## 2021-05-07 DIAGNOSIS — Z7189 Other specified counseling: Secondary | ICD-10-CM

## 2021-05-07 DIAGNOSIS — J9601 Acute respiratory failure with hypoxia: Secondary | ICD-10-CM

## 2021-05-07 DIAGNOSIS — I480 Paroxysmal atrial fibrillation: Secondary | ICD-10-CM | POA: Diagnosis not present

## 2021-05-07 DIAGNOSIS — D649 Anemia, unspecified: Secondary | ICD-10-CM

## 2021-05-07 DIAGNOSIS — R5381 Other malaise: Secondary | ICD-10-CM | POA: Diagnosis not present

## 2021-05-07 DIAGNOSIS — N1831 Chronic kidney disease, stage 3a: Secondary | ICD-10-CM

## 2021-05-07 LAB — CBC
HCT: 32.3 % — ABNORMAL LOW (ref 36.0–46.0)
Hemoglobin: 10.3 g/dL — ABNORMAL LOW (ref 12.0–15.0)
MCH: 30.7 pg (ref 26.0–34.0)
MCHC: 31.9 g/dL (ref 30.0–36.0)
MCV: 96.1 fL (ref 80.0–100.0)
Platelets: 284 10*3/uL (ref 150–400)
RBC: 3.36 MIL/uL — ABNORMAL LOW (ref 3.87–5.11)
RDW: 16.5 % — ABNORMAL HIGH (ref 11.5–15.5)
WBC: 4.9 10*3/uL (ref 4.0–10.5)
nRBC: 0 % (ref 0.0–0.2)

## 2021-05-07 LAB — BASIC METABOLIC PANEL
Anion gap: 9 (ref 5–15)
BUN: 25 mg/dL — ABNORMAL HIGH (ref 8–23)
CO2: 27 mmol/L (ref 22–32)
Calcium: 8.8 mg/dL — ABNORMAL LOW (ref 8.9–10.3)
Chloride: 103 mmol/L (ref 98–111)
Creatinine, Ser: 1.01 mg/dL — ABNORMAL HIGH (ref 0.44–1.00)
GFR, Estimated: 53 mL/min — ABNORMAL LOW (ref >60)
Glucose, Bld: 128 mg/dL — ABNORMAL HIGH (ref 70–99)
Potassium: 4.1 mmol/L (ref 3.5–5.1)
Sodium: 139 mmol/L (ref 135–145)

## 2021-05-07 LAB — GLUCOSE, CAPILLARY
Glucose-Capillary: 121 mg/dL — ABNORMAL HIGH (ref 70–99)
Glucose-Capillary: 150 mg/dL — ABNORMAL HIGH (ref 70–99)
Glucose-Capillary: 103 mg/dL — ABNORMAL HIGH (ref 70–99)
Glucose-Capillary: 116 mg/dL — ABNORMAL HIGH (ref 70–99)

## 2021-05-07 MED ORDER — FUROSEMIDE 10 MG/ML IJ SOLN
40.0000 mg | Freq: Every day | INTRAMUSCULAR | Status: DC
  Administered 2021-05-08: 40 mg via INTRAVENOUS
  Filled 2021-05-07: qty 4

## 2021-05-07 MED ORDER — ALUM & MAG HYDROXIDE-SIMETH 200-200-20 MG/5ML PO SUSP
15.0000 mL | Freq: Four times a day (QID) | ORAL | Status: DC | PRN
  Administered 2021-05-07: 15 mL via ORAL
  Filled 2021-05-07: qty 30

## 2021-05-07 MED ORDER — POTASSIUM CHLORIDE CRYS ER 20 MEQ PO TBCR
40.0000 meq | EXTENDED_RELEASE_TABLET | Freq: Once | ORAL | Status: AC
  Administered 2021-05-07: 40 meq via ORAL
  Filled 2021-05-07: qty 2

## 2021-05-07 MED ORDER — SENNOSIDES-DOCUSATE SODIUM 8.6-50 MG PO TABS: 1.0000 | ORAL_TABLET | Freq: Two times a day (BID) | ORAL | 0 refills | Status: DC | PRN

## 2021-05-07 NOTE — Progress Notes (Signed)
Mobility Specialist Progress Note:   05/07/21 1428  Mobility  Activity Ambulated in hall  Range of Motion/Exercises Active;All extremities  Level of Assistance Contact guard assist, steadying assist  Assistive Device Four wheel walker  Minutes Ambulated 8 minutes  Distance Ambulated (ft) 160 ft  Mobility Ambulated with assistance in hallway  Mobility Response Tolerated fair  Mobility performed by Mobility specialist  Bed Position Chair  Transport method Ambulatory  $Mobility charge 1 Mobility   Pt was received in bed and agreed to mobility. Ambulated in hall 160' with rollator and contact guard assist. 1x seated break halfway (80'). Anterior leans with forearms on rollator. C/o back pain when standing up straight and ambulating long distances. Pt was left in chair with call bell in reach.   Nelta Numbers Mobility Specialist  Phone [phone]

## 2021-05-07 NOTE — Progress Notes (Addendum)
Heart Failure Nurse Navigator Progress Note  PCP: [name], NP PCP-Cardiologist: Lauretta Grill., MD Admission Diagnosis: CHF exac Admitted from: independent living  Presentation:   Tara Fields presented 9/20 with increased weight gain, SOB and BLE edema. Son, Louie Casa, at bedside. Pt and son interactive with interview process. Pt lives at an independent living facility that provides cleaning and food services. Pt states she doesn't think the food is too overly salty. But has a limited menu to pick from daily. Pt/son states that her diuretic had been changed recently in the outpt setting, which is the possible cause of fluid build up. Changed from lasix to torsemide 20mg  x 3 days then decreased to 10mg . Possibly too low of a dose to meet patient needs and balance renal concerns.  Has follow up appt scheduled with Estella Husk at [facility] cardiology 10/4.    ECHO/ LVEF: 25-30%, s/p TAVR  Clinical Course:  Past Medical History:  Diagnosis Date   Aortic stenosis    s/p TAVR 26 mm Edwards Sapien 3 THV July 2020   CAD (coronary artery disease)    COPD (chronic obstructive pulmonary disease) (HCC)    Diabetes mellitus (HCC)    Diverticulitis    GERD (gastroesophageal reflux disease)    HTN (hypertension)    Hyperlipidemia    Legally blind in left eye, as defined in [place]    Tobacco abuse      Social History   Socioeconomic History   Marital status: Widowed    Spouse name: Not on file   Number of children: 2   Years of education: Not on file   Highest education level: High school graduate  Occupational History   Occupation: Retired    Comment: homemaker  Tobacco Use   Smoking status: Former    Packs/day: 1.00    Years: 70.00    Pack years: 70.00    Types: Cigarettes    Quit date: 12/15/2019    Years since quitting: 1.3   Smokeless tobacco: Never   Tobacco comments:    Pt states quiting a year ago. 01/03/20 [facility]  Vaping Use   Vaping Use: Never used  Substance and  Sexual Activity   Alcohol use: No    Alcohol/week: 0.0 standard drinks   Drug use: No   Sexual activity: Not Currently  Other Topics Concern   Not on file  Social History Narrative   Widowed- was married for 67 years    Has two sons       Social Determinants of Radio broadcast assistant Strain: Low Risk    Difficulty of Paying Living Expenses: Not hard at all  Food Insecurity: No Food Insecurity   Worried About Charity fundraiser in the Last Year: Never true   Arboriculturist in the Last Year: Never true  Transportation Needs: No Transportation Needs   Lack of Transportation (Medical): No   Lack of Transportation (Non-Medical): No  Physical Activity: Insufficiently Active   Days of Exercise per Week: 5 days   Minutes of Exercise per Session: 10 min  Stress: No Stress Concern Present   Feeling of Stress : Not at all  Social Connections: Socially Isolated   Frequency of Communication with Friends and Family: Three times a week   Frequency of Social Gatherings with Friends and Family: Three times a week   Attends Religious Services: Never   Active Member of Clubs or Organizations: No   Attends Archivist Meetings: Never  Marital Status: Widowed    High Risk Criteria for Readmission and/or Poor Patient Outcomes: Heart failure hospital admissions (last 6 months): 3  No Show rate: 2% Difficult social situation: no Demonstrates medication adherence: yes Primary Language: English Literacy level: able to read/write and comprehend.   Education Assessment and Provision:  Detailed education and instructions provided on heart failure disease management including the following:  Signs and symptoms of Heart Failure When to call the physician Importance of daily weights Low sodium diet Fluid restriction Medication management Anticipated future follow-up appointments  Patient education given on each of the above topics.  Patient acknowledges understanding via  teach back method and acceptance of all instructions.  Education Materials:  "Living Better With Heart Failure" Booklet, HF zone tool, & Daily Weight Tracker Tool.  Patient has scale at home: yes Patient has pill box at home: yes   Barriers of Care:   -dietary compliance -medication compliance?   Considerations/Referrals:   Referral made to Heart Failure Pharmacist Stewardship: yes, appreciated. Referral made to Heart Failure CSW/NCM TOC: no Referral made to Heart & Vascular TOC clinic: pending DC date, has close follow up with Estella Husk at [facility] office 10/4.   Items for Follow-up on DC/TOC: -optimize   Pricilla Holm, MSN, RN Heart Failure Nurse Navigator [phone]

## 2021-05-07 NOTE — Evaluation (Signed)
Physical Therapy Evaluation Patient Details Name: Tara Fields MRN: [id number] DOB: 08/28/1927 Today's Date: 05/07/2021  History of Present Illness  Pt is a 87 y.o. female who presented 05/05/21 with increased dyspnea, chest tightness and orthopnea along with 2-3 lb weight gain in 24 hr span. Chest x-ray noted pulmonary edema and pleural effusions bilaterally. Pt admitted with acute on chronic systolic CHF. Of note, pt was recently admitted with worsening anemia, + hemoccult, underwent EGD which noted erosive gastropathy and colonoscopy -noted diverticulosis, she was transfused 1U PRBC and discharged home on 9/1. PMH: chronic systolic CHF EF 38%, h/o TAVr, PAfib on eliquis, COPD, CAD, CKD 3a   Clinical Impression  Pt presents with condition above and deficits mentioned below, see PT Problem List. PTA, she was living at an ALF, functioning at a mod I level with a rollator for mobility. Currently, pt displays deficits in endurance, strength, balance, and AD management. She is able to perform all functional mobility at a min guard-supervision level without LOB, but need minA 1x to hold the rollator as she forgot to lock the brakes before trying to sit in it. She fatigues quickly, leaning anteriorly onto her forearms on the rollator with mobility, but maintained SpO2 >/= 91% on RA with gait. SpO2 did decrease to as low as 86% on RA when transferring to the bedside commode though. Expect pt will make good progress and be able to return to her ALF without needing assistance, but would benefit from further HHPT follow-up. Will continue to follow acutely.     Recommendations for follow up therapy are one component of a multi-disciplinary discharge planning process, led by the attending physician.  Recommendations may be updated based on patient status, additional functional criteria and insurance authorization.  Follow Up Recommendations Home health PT    Equipment Recommendations  None recommended by  PT    Recommendations for Other Services       Precautions / Restrictions Precautions Precautions: Fall;Other (comment) Precaution Comments: monitor O2 Restrictions Weight Bearing Restrictions: No      Mobility  Bed Mobility Overal bed mobility: Needs Assistance Bed Mobility: Supine to Sit     Supine to sit: Supervision;HOB elevated     General bed mobility comments: Supervision for safety with HOB elevated and use of rails.    Transfers Overall transfer level: Needs assistance Equipment used: 4-wheeled walker Transfers: Sit to/from [facility] Sit to Stand: Min guard Stand pivot transfers: Min guard       General transfer comment: Extra time and min guard assist for safety, no LOB.  Ambulation/Gait Ambulation/Gait assistance: Min guard;Min assist Gait Distance (Feet): 50 Feet (x2 bouts of ~50 ft) Assistive device: 4-wheeled walker Gait Pattern/deviations: Step-through pattern;Decreased stride length;Trunk flexed Gait velocity: decr Gait velocity interpretation: <1.31 ft/sec, indicative of household ambulator General Gait Details: Pt with slow, mostly steady gait, but tendency to display dyspnea and lean anteriorly onto her forearms on the rollator due to fatigue. Min guard for stability with gait, but minA 1x to manage rollator when pt turned to sit in it as she did not lock the brakes and it began to move.  Stairs            Wheelchair Mobility    Modified Rankin (Stroke Patients Only)       Balance Overall balance assessment: Needs assistance Sitting-balance support: No upper extremity supported;Feet supported Sitting balance-Leahy Scale: Good Sitting balance - Comments: Able to reach towards feet to attempt to donn socks without LOB, supervision  for safety.   Standing balance support: Bilateral upper extremity supported;During functional activity Standing balance-Leahy Scale: Poor Standing balance comment: Reliant on UE support  for mobility.                             Pertinent Vitals/Pain Pain Assessment: No/denies pain    Home Living Family/patient expects to be discharged to:: Assisted living               Home Equipment: Walker - 4 wheels;Cane - single point;Shower seat;Hand held shower head;Grab bars - tub/shower;Grab bars - toilet      Prior Function Level of Independence: Independent with assistive device(s)         Comments: Pt utilizes rollator to perform ADLs, including laundry. Was walking to dining room PTA. Denies falls in past 1 year.     Hand Dominance   Dominant Hand: Right    Extremity/Trunk Assessment   Upper Extremity Assessment Upper Extremity Assessment: Generalized weakness (noted functionally)    Lower Extremity Assessment Lower Extremity Assessment: Generalized weakness (noted functionally; denied numbness/tingling)    Cervical / Trunk Assessment Cervical / Trunk Assessment: Kyphotic  Communication   Communication: No difficulties  Cognition Arousal/Alertness: Awake/alert Behavior During Therapy: WFL for tasks assessed/performed Overall Cognitive Status: History of cognitive impairments - at baseline                                 General Comments: Pt with some STM deficits noted during session, family member present reported this is her baseline.      General Comments General comments (skin integrity, edema, etc.): SpO2 down to 86% on RA when transferring to chair but maintained >/= 91% on RA with ambulation    Exercises     Assessment/Plan    PT Assessment Patient needs continued PT services  PT Problem List Decreased strength;Decreased balance;Decreased activity tolerance;Decreased mobility;Decreased cognition;Decreased knowledge of use of DME;Cardiopulmonary status limiting activity       PT Treatment Interventions Therapeutic activities;Therapeutic exercise;Gait training;Functional mobility training;Balance  training;DME instruction;Neuromuscular re-education;Patient/family education    PT Goals (Current goals can be found in the Care Plan section)  Acute Rehab PT Goals Patient Stated Goal: to improve PT Goal Formulation: With patient/family Time For Goal Achievement: 05/21/21 Potential to Achieve Goals: Good    Frequency Min 3X/week   Barriers to discharge        Co-evaluation               AM-PAC PT "6 Clicks" Mobility  Outcome Measure Help needed turning from your back to your side while in a flat bed without using bedrails?: A Little Help needed moving from lying on your back to sitting on the side of a flat bed without using bedrails?: A Little Help needed moving to and from a bed to a chair (including a wheelchair)?: A Little Help needed standing up from a chair using your arms (e.g., wheelchair or bedside chair)?: A Little Help needed to walk in hospital room?: A Little Help needed climbing 3-5 steps with a railing? : A Little 6 Click Score: 18    End of Session Equipment Utilized During Treatment: Gait belt Activity Tolerance: Patient tolerated treatment well;Patient limited by fatigue Patient left: in chair;with call bell/phone within reach;with family/visitor present Nurse Communication: Mobility status PT Visit Diagnosis: Difficulty in walking, not elsewhere classified (R26.2);Unsteadiness on feet (R26.81);Other abnormalities  of gait and mobility (R26.89);Muscle weakness (generalized) (M62.81)    Time: 0272-5366 PT Time Calculation (min) (ACUTE ONLY): 41 min   Charges:   PT Evaluation $PT Eval Moderate Complexity: 1 Mod PT Treatments $Gait Training: 23-37 mins        Moishe Spice, PT, DPT Acute Rehabilitation Services  Pager: [phone] Office: [phone]   Orvan Falconer 05/07/2021, 11:17 AM

## 2021-05-07 NOTE — Progress Notes (Addendum)
PROGRESS NOTE  Tara Fields KVQ:[id number] DOB: 1928-01-20   PCP: [name], NP  Patient is from: Home  DOA: 05/05/2021 LOS: 2  Chief complaints:  Chief Complaint  Patient presents with   Shortness of Breath     Brief Narrative / Interim history: 66/P with systolic CHF EF 32%, h/o TAVR, PAfib on eliquis, COPD not on O2, CAD, CKD-3A and recent hospitalization from 8/29-9/1 for ABLA/GIB with EGD showing erosive gastropathy and colonoscopy showing diverticulosis.  She returns with progressive SOB, chest tightness, orthopnea and weight gain that did not improve by changing her Lasix to torsemide, and admitted for acute respiratory failure with hypoxia to lower 80s due to acute on chronic systolic CHF.  Started on IV Lasix.  Per patient's son, dry weight ranges from 140-145.  Patient reports significant improvement with IV Lasix.   Subjective: Seen and examined earlier this morning.  No major events overnight of this morning.  She reports improvement with her breathing.  She denies chest pain, palpitation, GI or UTI symptoms.  Objective: Vitals:   05/06/21 2113 05/07/21 0344 05/07/21 0820 05/07/21 1225  BP: 127/67 137/71  (!) 127/58  Pulse: 80 83  80  Resp:  18  16  Temp:  (!) 97.4 F (36.3 C)  98 F (36.7 C)  TempSrc:  Oral    SpO2:  99% 96%   Weight:  65 kg    Height:        Intake/Output Summary (Last 24 hours) at 05/07/2021 1528 Last data filed at 05/07/2021 1300 Gross per 24 hour  Intake 1015 ml  Output 2800 ml  Net -1785 ml   Filed Weights   05/05/21 2118 05/06/21 0448 05/07/21 0344  Weight: 67.5 kg 66.7 kg 65 kg    Examination:  GENERAL: No apparent distress.  Nontoxic. HEENT: MMM.  Vision and hearing grossly intact.  NECK: Supple.  No apparent JVD.  RESP: On RA.  No IWOB.  Fair aeration bilaterally. CVS:  RRR. Heart sounds normal.  ABD/GI/GU: BS+. Abd soft, NTND.  MSK/EXT:  Moves extremities. No apparent deformity. No edema.  SKIN: no apparent  skin lesion or wound NEURO: Awake, alert and oriented appropriately.  No apparent focal neuro deficit. PSYCH: Calm. Normal affect.   Procedures:  None  Microbiology summarized: [id number] and influenza PCR nonreactive.  Assessment & Plan: Acute hypoxic respiratory failure-desaturated to low 80s on presentation. Acute on chronic systolic CHF-TTE in 01/6062 with LVEF of 25 to 30%, indeterminate DD, patent TAVR.  Heart cardinal symptoms with elevated BNP to 2600 and CXR with pulmonary edema on presentation.  Diuresed with IV Lasix with improvement in his symptoms and fluid status.  Weight down 5 to 10 pounds.  Net -2.8 L so far.  Creatinine slightly up but stable. -Decrease IV Lasix to 40 mg daily.  -Continue metoprolol and low-dose losartan -Monitor fluid status, renal functions and electrolytes -Sodium and fluid restrictions -Repeat BMP prior to discharge -Consider increasing home torsemide with option to take additional PRN  -Consider decreasing home losartan on discharge to allow room for diuretics -Incentive prom[place]/OOB/PT/OT  Controlled DM-2 with CKD-3A: A1c 5.0% but she had a transfusion about 3 weeks ago. Recent Labs  Lab 05/06/21 1118 05/06/21 1629 05/06/21 2058 05/07/21 0543 05/07/21 1100  GLUCAP 119* 118* 119* 121* 150*  -Continue current insulin regimen  CKD-3A/azotemia: Relatively stable. Recent Labs    04/13/21 1132 04/14/21 0160 04/15/21 1093 04/16/21 2355 04/23/21 1105 04/29/21 7322 05/05/21 0254 05/06/21 2706 05/06/21 2105 05/07/21 0410  BUN 37* 34* 21 17 23  31* 25* 24* 28* 25*  CREATININE 1.37* 1.02* 0.82 0.86 1.07* 1.08 1.04* 0.96 0.95 1.01*  -Decrease Lasix as above -Continue low-dose losartan  History of severe AS s/p TAVR at [place] in 02/2019-patent on last echocardiogram.  History of CAD: Pre-TAVR LHC at [facility] in 2019 with nonobstructive CAD.  Stable. -Continue metoprolol, statin and aspirin -Discontinued fenofibrate-high risk for myopathy with  statin.  Paroxysmal A. fib: Currently in sinus rhythm and rate controlled. -Continue metoprolol and Eliquis  Normocytic anemia-likely combination of blood loss and chronic disease.  Stable Recent Labs    01/10/21 1934 01/29/21 0636 04/13/21 1132 04/14/21 0523 04/15/21 0519 04/16/21 0551 04/29/21 0912 05/05/21 0854 05/06/21 0349 05/07/21 0410  HGB 11.0* 10.7* 7.2* 7.8* 8.1* 9.0* 9.8* 9.9* 9.9* 10.3*  -Monitor as needed  Chronic COPD: Stable. -Continue breathing treatments  Physical deconditioning/debility: -PT/OT eval  Goal of care counseling: Appropriately DNR/DNI.  Body mass index is 27.97 kg/m.       Pressure Injury 04/14/21 Sacrum Posterior;Upper Stage 2 -  Partial thickness loss of dermis presenting as a shallow open injury with a red, pink wound bed without slough. (Active)  04/14/21 1000  Location: Sacrum  Location Orientation: Posterior;Upper  Staging: Stage 2 -  Partial thickness loss of dermis presenting as a shallow open injury with a red, pink wound bed without slough.  Wound Description (Comments):   Present on Admission: Yes     Pressure Injury 05/05/21 Sacrum Medial Stage 1 -  Intact skin with non-blanchable redness of a localized area usually over a bony prominence. (Active)  05/05/21 2237  Location: Sacrum  Location Orientation: Medial  Staging: Stage 1 -  Intact skin with non-blanchable redness of a localized area usually over a bony prominence.  Wound Description (Comments):   Present on Admission: Yes   DVT prophylaxis:   apixaban (ELIQUIS) tablet 5 mg  Code Status: DNR/DNI Family Communication: Updated patient's son at bedside. Level of care: Telemetry Medical Status is: Inpatient  Remains inpatient appropriate because:IV treatments appropriate due to intensity of illness or inability to take PO and Inpatient level of care appropriate due to severity of illness  Dispo: The patient is from: Home              Anticipated d/c is to: Home               Patient currently is not medically stable to d/c.   Difficult to place patient No       Consultants:  None   Sch Meds:  Scheduled Meds:  apixaban  5 mg Oral BID   aspirin  81 mg Oral q morning   atorvastatin  40 mg Oral QPM   [START ON 05/08/2021] furosemide  40 mg Intravenous Daily   insulin aspart  0-9 Units Subcutaneous TID WC   losartan  25 mg Oral Daily   metoprolol tartrate  50 mg Oral BID   potassium chloride  20 mEq Oral BID   terazosin  10 mg Oral q AM   Continuous Infusions: PRN Meds:.acetaminophen **OR** acetaminophen  Antimicrobials: Anti-infectives (From admission, onward)    None        I have personally reviewed the following labs and images: CBC: Recent Labs  Lab 05/05/21 0854 05/06/21 0349 05/07/21 0410  WBC 5.7 5.6 4.9  NEUTROABS 4.8  --   --   HGB 9.9* 9.9* 10.3*  HCT 33.4* 31.5* 32.3*  MCV 99.4 96.0 96.1  PLT 292  275 284   BMP &GFR Recent Labs  Lab 05/05/21 0854 05/06/21 0349 05/06/21 2105 05/07/21 0410  NA 137 142 138 139  K 4.1 3.8 3.3* 4.1  CL 102 103 101 103  CO2 23 26 27 27   GLUCOSE 173* 139* 123* 128*  BUN 25* 24* 28* 25*  CREATININE 1.04* 0.96 0.95 1.01*  CALCIUM 9.0 9.2 8.8* 8.8*  MG  --   --  1.8  --    Estimated Creatinine Clearance: 30.5 mL/min (A) (by C-G formula based on SCr of 1.01 mg/dL (H)). Liver & Pancreas: No results for input(s): AST, ALT, ALKPHOS, BILITOT, PROT, ALBUMIN in the last 168 hours. No results for input(s): LIPASE, AMYLASE in the last 168 hours. No results for input(s): AMMONIA in the last 168 hours. Diabetic: No results for input(s): HGBA1C in the last 72 hours. Recent Labs  Lab 05/06/21 1118 05/06/21 1629 05/06/21 2058 05/07/21 0543 05/07/21 1100  GLUCAP 119* 118* 119* 121* 150*   Cardiac Enzymes: No results for input(s): CKTOTAL, CKMB, CKMBINDEX, TROPONINI in the last 168 hours. No results for input(s): PROBNP in the last 8760 hours. Coagulation Profile: No results  for input(s): INR, PROTIME in the last 168 hours. Thyroid Function Tests: No results for input(s): TSH, T4TOTAL, FREET4, T3FREE, THYROIDAB in the last 72 hours. Lipid Profile: No results for input(s): CHOL, HDL, LDLCALC, TRIG, CHOLHDL, LDLDIRECT in the last 72 hours. Anemia Panel: No results for input(s): VITAMINB12, FOLATE, FERRITIN, TIBC, IRON, RETICCTPCT in the last 72 hours. Urine analysis:    Component Value Date/Time   COLORURINE YELLOW 09/01/2020 1403   APPEARANCEUR HAZY (A) 09/01/2020 1403   LABSPEC 1.019 09/01/2020 1403   PHURINE 6.0 09/01/2020 1403   GLUCOSEU NEGATIVE 09/01/2020 1403   GLUCOSEU NEGATIVE 12/11/2019 1033   HGBUR NEGATIVE 09/01/2020 1403   [place] 09/01/2020 1403   [place] 09/01/2020 1403   PROTEINUR 30 (A) 09/01/2020 1403   UROBILINOGEN 0.2 12/11/2019 1033   NITRITE NEGATIVE 09/01/2020 1403   LEUKOCYTESUR TRACE (A) 09/01/2020 1403   Sepsis Labs: Invalid input(s): PROCALCITONIN, [place]  Microbiology: Recent Results (from the past 240 hour(s))  Resp Panel by RT-PCR (Flu A&B, Covid) Nasopharyngeal Swab     Status: None   Collection Time: 05/05/21  8:18 AM   Specimen: Nasopharyngeal Swab; Nasopharyngeal(NP) swabs in vial transport medium  Result Value Ref Range Status   SARS Coronavirus 2 by RT PCR NEGATIVE NEGATIVE Final    Comment: (NOTE) SARS-CoV-2 target nucleic acids are NOT DETECTED.  The SARS-CoV-2 RNA is generally detectable in upper respiratory specimens during the acute phase of infection. The lowest concentration of SARS-CoV-2 viral copies this assay can detect is 138 copies/mL. A negative result does not preclude SARS-Cov-2 infection and should not be used as the sole basis for treatment or other patient management decisions. A negative result may occur with  improper specimen collection/handling, submission of specimen other than nasopharyngeal swab, presence of viral mutation(s) within the areas targeted by  this assay, and inadequate number of viral copies(<138 copies/mL). A negative result must be combined with clinical observations, patient history, and epidemiological information. The expected result is Negative.  Fact Sheet for Patients:  EntrepreneurPulse.com.au  Fact Sheet for Healthcare Providers:  IncredibleEmployment.be  This test is no t yet approved or cleared by the [place] FDA and  has been authorized for detection and/or diagnosis of SARS-CoV-2 by FDA under an Emergency Use Authorization (EUA). This EUA will remain  in effect (meaning this test can be  used) for the duration of the COVID-19 declaration under Section 564(b)(1) of the Act, 21 U.S.C.section 360bbb-3(b)(1), unless the authorization is terminated  or revoked sooner.       Influenza A by PCR NEGATIVE NEGATIVE Final   Influenza B by PCR NEGATIVE NEGATIVE Final    Comment: (NOTE) The Xpert Xpress SARS-CoV-2/FLU/RSV plus assay is intended as an aid in the diagnosis of influenza from Nasopharyngeal swab specimens and should not be used as a sole basis for treatment. Nasal washings and aspirates are unacceptable for Xpert Xpress SARS-CoV-2/FLU/RSV testing.  Fact Sheet for Patients: EntrepreneurPulse.com.au  Fact Sheet for Healthcare Providers: IncredibleEmployment.be  This test is not yet approved or cleared by the [place] FDA and has been authorized for detection and/or diagnosis of SARS-CoV-2 by FDA under an Emergency Use Authorization (EUA). This EUA will remain in effect (meaning this test can be used) for the duration of the COVID-19 declaration under Section 564(b)(1) of the Act, 21 U.S.C. section 360bbb-3(b)(1), unless the authorization is terminated or revoked.  Performed at [facility], [place] [address], [place], [place] [address]     Radiology Studies: No results found.    Shalandria Elsbernd T. Varna  If 7PM-7AM, please contact night-coverage [email or web address] 05/07/2021, 3:28 PM

## 2021-05-07 NOTE — Progress Notes (Signed)
   05/07/21 1050  Clinical Encounter Type  Visited With Patient  Visit Type Initial  Referral From Nurse  Consult/Referral To Chaplain   Chaplain responded to the consult request for an Advance Directive. The patient declined and stated she does not need an Advance Directive because she has a will that clearly outlines everything, and her son is aware and will make decisions for her. This note was prepared by Jeanine Luz, M.Div..  For questions please contact by phone [phone].

## 2021-05-07 NOTE — Plan of Care (Signed)
  Problem: Education: Goal: Knowledge of General Education information will improve Description Including pain rating scale, medication(s)/side effects and non-pharmacologic comfort measures Outcome: Progressing   Problem: Health Behavior/Discharge Planning: Goal: Ability to manage health-related needs will improve Outcome: Progressing   

## 2021-05-07 NOTE — TOC Initial Note (Signed)
Transition of Care [facility]) - Initial/Assessment Note    Patient Details  Name: Tara Fields MRN: [id number] Date of Birth: 09-13-1927  Transition of Care [facility]) CM/SW Contact:    Zenon Mayo, RN Phone Number: 05/07/2021, 4:27 PM  Clinical Narrative:                 NCM spoke with patient at bedside, she is from [place], she is active with [facility] for HHRN , HHPT, NCM offered choice, she would like to continue with them. She uses a walker at home and she has a w/chair and a walk in shower.  Son will transport her at [employer], he takes her to [facility], she has two sons one calls her in the am to make sure she has taken her meds and one calls her at night to make sure she has taken her meds.  TOC will continue to follow for dc needs.  Expected Discharge Plan: [place] Barriers to Discharge: Continued Medical Work up   Patient Goals and CMS Choice Patient states their goals for this hospitalization and ongoing recovery are:: return home with [facility] CMS Medicare.gov Compare Post Acute Care list provided to:: Patient Choice offered to / list presented to : Patient  Expected Discharge Plan and Services Expected Discharge Plan: [place]   Discharge Planning Services: CM Consult Post Acute Care Choice: [place] arrangements for the past 2 months: [place]                   DME Agency: NA       HH Arranged: RN, PT [place] Agency: [place] ([place]) Date HH Agency Contacted: 05/07/21 Time HH Agency Contacted: 63 Representative spoke with at [place]: Ramond Marrow  Prior Living Arrangements/Services Living arrangements for the past 2 months: [place] Lives with:: Self Patient language and need for interpreter reviewed:: Yes Do you feel safe going back to the place where you live?: Yes      Need for Family Participation in Patient Care: Yes (Comment) Care giver support system in  place?: Yes (comment) Current home services: Home PT, Home RN, DME ([facility] a walker , w/chair, walk in shower) Criminal Activity/Legal Involvement Pertinent to Current Situation/Hospitalization: No - Comment as needed  Activities of Daily Living Home Assistive Devices/Equipment: None ADL Screening (condition at time of admission) Patient's cognitive ability adequate to safely complete daily activities?: Yes Is the patient deaf or have difficulty hearing?: No Does the patient have difficulty seeing, even when wearing glasses/contacts?: No Does the patient have difficulty concentrating, remembering, or making decisions?: No Patient able to express need for assistance with ADLs?: Yes Does the patient have difficulty dressing or bathing?: Yes Independently performs ADLs?: No Communication: Independent Dressing (OT): Independent Grooming: Independent Feeding: Independent Bathing: Needs assistance Is this a change from baseline?: Pre-admission baseline Toileting: Needs assistance Is this a change from baseline?: Pre-admission baseline In/Out Bed: Needs assistance Is this a change from baseline?: Pre-admission baseline Walks in Home: Independent with device (comment) Does the patient have difficulty walking or climbing stairs?: Yes Weakness of Legs: Both Weakness of Arms/Hands: None  Permission Sought/Granted                  Emotional Assessment Appearance:: Appears stated age Attitude/Demeanor/Rapport: Engaged Affect (typically observed): Appropriate Orientation: : Oriented to Self, Oriented to Place, Oriented to  Time, Oriented to Situation Alcohol / Substance Use: Not  Applicable Psych Involvement: No (comment)  Admission diagnosis:  CHF (congestive heart failure) (HCC) [I50.9] SOB (shortness of breath) [R06.02] Hypoxia [R09.02] Hypervolemia, unspecified hypervolemia type [E87.70] Patient Active Problem List   Diagnosis Date Noted   CHF (congestive  heart failure) ([place]) 05/05/2021   GI bleed 04/13/2021   Anemia 04/13/2021   Symptomatic anemia 04/13/2021   Prolonged QT interval 04/13/2021   CKD (chronic kidney disease), stage III ([place]) 04/13/2021   Acute on chronic systolic CHF (congestive heart failure) ([place]) 01/29/2021   Cognitive deficits    Pressure injury of skin 12/19/2020   Acute exacerbation of CHF (congestive heart failure) ([place]) 12/18/2020   COPD (chronic obstructive pulmonary disease) ([employer]) 09/02/2020   Dyslipidemia 09/02/2020   Acute respiratory failure with hypoxia ([place]) 09/01/2020   Pulmonary nodule 1 cm or greater in diameter 01/03/2020   S/P TAVR (transcatheter aortic valve replacement) 05/03/2019   Primary osteoarthritis involving multiple joints 05/03/2019   Other persistent atrial fibrillation ([place]) 05/03/2019   Bilateral carotid artery stenosis 12/01/2017   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/13/2014   Aortic valve disorder 05/03/2014   Chronic obstructive bronchitis without exacerbation ([place]) 05/03/2014   Derangement of left knee 05/03/2014   Esophageal reflux 05/03/2014   Essential hypertension 05/03/2014   Pure hypercholesterolemia 05/03/2014   Type II diabetes mellitus ([place]) 05/03/2014   Vitamin D deficiency 05/03/2014   PCP:  [name], NP Pharmacy:   [id number] - [place], [place] [address] [place] [address] Phone: [phone] Fax: [phone]     Social Determinants of Health (SDOH) Interventions    Readmission Risk Interventions Readmission Risk Prevention Plan 05/07/2021 04/16/2021  Transportation Screening Complete Complete  PCP or Specialist Appt within 3-5 Days - Complete  HRI or [place] - Complete  Palliative Care Screening - Not Applicable  Medication Review (RN Care Manager) Complete -  PCP or Specialist appointment within 3-5 days of discharge Complete -  [place] or Home Care Consult  Complete -  Palliative Care Screening Not Applicable -  [place] Not Applicable -  Some recent data might be hidden

## 2021-05-08 DIAGNOSIS — N179 Acute kidney failure, unspecified: Secondary | ICD-10-CM | POA: Diagnosis not present

## 2021-05-08 DIAGNOSIS — N189 Chronic kidney disease, unspecified: Secondary | ICD-10-CM | POA: Diagnosis not present

## 2021-05-08 DIAGNOSIS — I5023 Acute on chronic systolic (congestive) heart failure: Secondary | ICD-10-CM | POA: Diagnosis not present

## 2021-05-08 LAB — RETICULOCYTES
Immature Retic Fract: 25.4 % — ABNORMAL HIGH (ref 2.3–15.9)
RBC.: 3.45 MIL/uL — ABNORMAL LOW (ref 3.87–5.11)
Retic Count, Absolute: 99.4 10*3/uL (ref 19.0–186.0)
Retic Ct Pct: 2.9 % (ref 0.4–3.1)

## 2021-05-08 LAB — HEPATIC FUNCTION PANEL
ALT: 17 U/L (ref 0–44)
AST: 24 U/L (ref 15–41)
Albumin: 3.1 g/dL — ABNORMAL LOW (ref 3.5–5.0)
Alkaline Phosphatase: 37 U/L — ABNORMAL LOW (ref 38–126)
Bilirubin, Direct: 0.3 mg/dL — ABNORMAL HIGH (ref 0.0–0.2)
Indirect Bilirubin: 0.5 mg/dL (ref 0.3–0.9)
Total Bilirubin: 0.8 mg/dL (ref 0.3–1.2)
Total Protein: 5.6 g/dL — ABNORMAL LOW (ref 6.5–8.1)

## 2021-05-08 LAB — RENAL FUNCTION PANEL
Albumin: 3 g/dL — ABNORMAL LOW (ref 3.5–5.0)
Anion gap: 9 (ref 5–15)
BUN: 25 mg/dL — ABNORMAL HIGH (ref 8–23)
CO2: 26 mmol/L (ref 22–32)
Calcium: 9 mg/dL (ref 8.9–10.3)
Chloride: 105 mmol/L (ref 98–111)
Creatinine, Ser: 1.02 mg/dL — ABNORMAL HIGH (ref 0.44–1.00)
GFR, Estimated: 52 mL/min — ABNORMAL LOW (ref >60)
Glucose, Bld: 139 mg/dL — ABNORMAL HIGH (ref 70–99)
Phosphorus: 3.2 mg/dL (ref 2.5–4.6)
Potassium: 4 mmol/L (ref 3.5–5.1)
Sodium: 140 mmol/L (ref 135–145)

## 2021-05-08 LAB — CBC
HCT: 32.7 % — ABNORMAL LOW (ref 36.0–46.0)
Hemoglobin: 10.2 g/dL — ABNORMAL LOW (ref 12.0–15.0)
MCH: 29.6 pg (ref 26.0–34.0)
MCHC: 31.2 g/dL (ref 30.0–36.0)
MCV: 94.8 fL (ref 80.0–100.0)
Platelets: 313 10*3/uL (ref 150–400)
RBC: 3.45 MIL/uL — ABNORMAL LOW (ref 3.87–5.11)
RDW: 16.2 % — ABNORMAL HIGH (ref 11.5–15.5)
WBC: 5.4 10*3/uL (ref 4.0–10.5)
nRBC: 0 % (ref 0.0–0.2)

## 2021-05-08 LAB — IRON AND TIBC
Iron: 28 ug/dL (ref 28–170)
Saturation Ratios: 6 % — ABNORMAL LOW (ref 10.4–31.8)
TIBC: 465 ug/dL — ABNORMAL HIGH (ref 250–450)
UIBC: 437 ug/dL

## 2021-05-08 LAB — LIPID PANEL
Cholesterol: 94 mg/dL (ref 0–200)
HDL: 34 mg/dL — ABNORMAL LOW (ref >40)
LDL Cholesterol: 49 mg/dL (ref 0–99)
Total CHOL/HDL Ratio: 2.8 RATIO
Triglycerides: 57 mg/dL (ref <150)
VLDL: 11 mg/dL (ref 0–40)

## 2021-05-08 LAB — FERRITIN: Ferritin: 63 ng/mL (ref 11–307)

## 2021-05-08 LAB — GLUCOSE, CAPILLARY
Glucose-Capillary: 126 mg/dL — ABNORMAL HIGH (ref 70–99)
Glucose-Capillary: 111 mg/dL — ABNORMAL HIGH (ref 70–99)

## 2021-05-08 LAB — BRAIN NATRIURETIC PEPTIDE: B Natriuretic Peptide: 1904.4 pg/mL — ABNORMAL HIGH (ref 0.0–100.0)

## 2021-05-08 LAB — LIPASE, BLOOD: Lipase: 25 U/L (ref 11–51)

## 2021-05-08 LAB — VITAMIN B12: Vitamin B-12: 519 pg/mL (ref 180–914)

## 2021-05-08 LAB — TROPONIN I (HIGH SENSITIVITY)
Troponin I (High Sensitivity): 20 ng/L — ABNORMAL HIGH (ref <18)
Troponin I (High Sensitivity): 21 ng/L — ABNORMAL HIGH (ref <18)

## 2021-05-08 LAB — FOLATE: Folate: 8.5 ng/mL (ref >5.9)

## 2021-05-08 LAB — MAGNESIUM: Magnesium: 2 mg/dL (ref 1.7–2.4)

## 2021-05-08 MED ORDER — METOPROLOL SUCCINATE ER 100 MG PO TB24: 100.0000 mg | ORAL_TABLET | Freq: Every day | ORAL | 0 refills | Status: DC

## 2021-05-08 MED ORDER — TORSEMIDE 10 MG PO TABS
30.0000 mg | ORAL_TABLET | Freq: Every day | ORAL | 0 refills | Status: DC
Start: 2021-05-08 — End: 2021-05-19

## 2021-05-08 MED ORDER — METOPROLOL SUCCINATE ER 100 MG PO TB24: 100.0000 mg | ORAL_TABLET | Freq: Every day | ORAL | Status: DC

## 2021-05-08 MED ORDER — LOSARTAN POTASSIUM 25 MG PO TABS: 25.0000 mg | ORAL_TABLET | Freq: Every day | ORAL | 0 refills | Status: DC

## 2021-05-08 MED ORDER — PANTOPRAZOLE SODIUM 40 MG PO TBEC: 40.0000 mg | DELAYED_RELEASE_TABLET | Freq: Every day | ORAL | 0 refills | Status: DC

## 2021-05-08 MED ORDER — PANTOPRAZOLE SODIUM 40 MG IV SOLR
40.0000 mg | Freq: Two times a day (BID) | INTRAVENOUS | Status: DC
  Administered 2021-05-08: 40 mg via INTRAVENOUS
  Filled 2021-05-08: qty 40

## 2021-05-08 NOTE — Progress Notes (Signed)
   05/08/21 0923  Mobility  Activity Refused mobility (Patient declined moblity d/t exhaustion and fear of IV displacement or bleeding. Will check back later)

## 2021-05-08 NOTE — Care Management Important Message (Signed)
Important Message  Patient Details  Name: Tara Fields MRN: [id number] Date of Birth: 03-10-1928   Medicare Important Message Given:  Yes     Shaterra [address] 05/08/2021, 3:38 PM

## 2021-05-08 NOTE — Progress Notes (Addendum)
HOSPITAL MEDICINE OVERNIGHT EVENT NOTE    Informed by nursing the patient has been complaining of epigastric pain that seems to be unrelieved by administration of Tylenol and Maalox.  Nursing reports that abdomen is soft.  However, there is point tenderness over the epigastric region.  Chart reviewed, it appears that patient underwent EGD 8/31 that revealed evidence of erosive gastropathy.  Considering location of tenderness this very well may be the cause.  We will go ahead and place patient on IV PPI to treat this.  We will additionally obtain lipase and hepatic function panel.  If abdominal pain worsens will consider CT imaging.  Tara Emerald  MD [facility]

## 2021-05-08 NOTE — Progress Notes (Signed)
Pt c/o epigastric pain. States it is keeping her awake. Pain unrelieved by tylenol or maalox. MD notified. EKG ordered and obtained. Lab orders placed and PPI ordered.

## 2021-05-08 NOTE — Progress Notes (Signed)
MD notified of pt's troponin levels.

## 2021-05-08 NOTE — Progress Notes (Signed)
Mobility Specialist Progress Note:    05/08/21 1405  Mobility  Activity Ambulated in hall  Range of Motion/Exercises Active;All extremities  Level of Assistance Modified independent, requires aide device or extra time  Assistive Device Four wheel walker  Minutes Ambulated 8 minutes  Distance Ambulated (ft) 164 ft  Mobility Ambulated with assistance in hallway  Mobility Response Tolerated well  Mobility performed by Mobility specialist  Transport method Ambulatory  $Mobility charge 1 Mobility   Pt was received in bed, ready for mobility as she is about to be d/c. Ambulated 61' with a rollator and supervision. Took a seated break at 82'. Still anterior leans with forearms on rollator. Pt left in bed with call bell in reach.   Nelta Numbers Mobility Specialist  Phone [phone]

## 2021-05-08 NOTE — Progress Notes (Signed)
Heart Failure Nurse Navigator Progress Note  Planned DC today. No HV TOC appt as pt has scheduled appt with [facility] cardiology 10/4.   Pricilla Holm, MSN, RN Heart Failure Nurse Navigator [phone]

## 2021-05-08 NOTE — Discharge Summary (Signed)
Physician Discharge Summary  Patient ID: Tara Fields MRN: [id number] DOB/AGE: 04/02/1928 87 y.o.  Admit date: 05/05/2021 Discharge date: 05/08/2021  Admission Diagnoses:  Discharge Diagnoses:  Active Problems: Acute on chronic systolic CHF (congestive heart failure) ([place])   Discharged Condition: stable   Hospital Course: Patient is a 87 year old Caucasian female with past medical history significant for systolic CHF EF 18%, h/o TAVR, PAfib on eliquis, COPD not on O2, CAD, CKD-3A and recent hospitalization from 8/29-9/1 for acute blood loss anemia/GI bleed with EGD showing erosive gastropathy and colonoscopy showing diverticulosis.  Patient was readmitted with progressive SOB (cute respiratory failure with hypoxia to lower 80s due to acute on chronic systolic CHF), chest tightness, orthopnea and weight gain that did not improve by changing her Lasix to torsemide.  Patient was admitted and managed with IV Lasix.  With adequate diuresis, patient's symptoms improved significantly.  Patient has back to her baseline.  Patient will be discharged today.     Acute hypoxic respiratory failure/Acute on chronic systolic CHF: -Desaturated to low 80s on presentation. -TTE in 12/2020 with LVEF of 25 to 30%, indeterminate DD, patent TAVR.   -Heart cardinal symptoms with elevated BNP to 2600 and CXR with pulmonary edema on presentation.  Diuresed with IV Lasix with improvement in his symptoms and fluid status.  Weight down 5 to 10 pounds.  Net -2.8 L so far.  Creatinine slightly up but stable. -Patient be discharged on oral torsemide -Continue metoprolol and low-dose losartan -Monitor fluid status, renal functions and electrolytes -Sodium and fluid restrictions -Monitor renal function, electrolytes and volume status closely.  Epeat BMP prior to discharge -Incentive [place]/OOB/PT/OT   Controlled DM-2 with CKD-3A: A1c 5.0% but she had a transfusion about 3 weeks ago. Last Labs          Recent  Labs  Lab 05/06/21 1118 05/06/21 1629 05/06/21 2058 05/07/21 0543 05/07/21 1100  GLUCAP 119* 118* 119* 121* 150*    -Continue current insulin regimen   Acute kidney injury on chronic kidney disease: -AKI may be secondary to cardiorenal syndrome. -Serum creatinine improved with diuresis.   -Continue to monitor renal function, electrolytes and volume status.  Recent Labs (within last 365 days)              Recent Labs    04/13/21 1132 04/14/21 0523 04/15/21 0519 04/16/21 0551 04/23/21 1105 04/29/21 0912 05/05/21 0854 05/06/21 0349 05/06/21 2105 05/07/21 0410  BUN 37* 34* 21 17 23  31* 25* 24* 28* 25*  CREATININE 1.37* 1.02* 0.82 0.86 1.07* 1.08 1.04* 0.96 0.95 1.01*    -Decrease Lasix as above -Continue low-dose losartan   History of severe AS (aortic stenosis): -S/p TAVR at [facility] in 02/2019-patent on last echocardiogram.   History of CAD:  -Pre-TAVR LHC at [facility] in 2019 with nonobstructive CAD.  Stable. -Continue metoprolol, statin and aspirin -Discontinued fenofibrate-high risk for myopathy with statin.   Paroxysmal A. fib:  -Currently in sinus rhythm and rate controlled. -Continue metoprolol and Eliquis   Normocytic anemia-likely combination of blood loss and chronic disease.  Stable Recent Labs (within last 365 days)              Recent Labs    01/10/21 1934 01/29/21 0636 04/13/21 1132 04/14/21 0523 04/15/21 0519 04/16/21 0551 04/29/21 0912 05/05/21 0854 05/06/21 0349 05/07/21 0410  HGB 11.0* 10.7* 7.2* 7.8* 8.1* 9.0* 9.8* 9.9* 9.9* 10.3*    -Monitor as needed   Chronic COPD: Stable. -Continue breathing treatments   Physical deconditioning/debility: -PT/OT  eval   Goal of care counseling: Appropriately DNR/DNI.   Body mass index is 27.97 kg/m. Pressure Injury 04/14/21 Sacrum Posterior;Upper Stage 2 -  Partial thickness loss of dermis presenting as a shallow open injury with a red, pink wound bed without slough. (Active)  04/14/21 1000   Location: Sacrum  Location Orientation: Posterior;Upper  Staging: Stage 2 -  Partial thickness loss of dermis presenting as a shallow open injury with a red, pink wound bed without slough.  Wound Description (Comments):   Present on Admission: Yes     Pressure Injury 05/05/21 Sacrum Medial Stage 1 -  Intact skin with non-blanchable redness of a localized area usually over a bony prominence. (Active)  05/05/21 2237  Location: Sacrum  Location Orientation: Medial  Staging: Stage 1 -  Intact skin with non-blanchable redness of a localized area usually over a bony prominence.  Wound Description (Comments):   Present on Admission: Yes    Consults: None   Discharge Exam: Blood pressure (!) 118/58, pulse 81, temperature 97.7 F (36.5 C), temperature source Oral, resp. rate 20, height 5' (1.524 m), weight 65.1 kg, SpO2 95 %.   Disposition: Discharge disposition: 01-Home or Self Care       Discharge Instructions     Diet - low sodium heart healthy   Complete by: As directed    Discharge wound care:   Complete by: As directed    Continue current managemen.   Increase activity slowly   Complete by: As directed       Allergies as of 05/08/2021       Reactions   Amlodipine Besy-benazepril Hcl Cough   Hctz [hydrochlorothiazide] Other (See Comments)   Per family, every diuretic the patient has been on flushes out her out and ultimately results in CRAMPING/JAUNDICE (added potassium might help)   Other Other (See Comments)   History of diverticulitis- CANNOT TOLERATE NUTS, CORN, AND ANY FOODS NOT EASILY DIGESTED- the patient AVOIDS these   Cyclobenzaprine Other (See Comments)   Confusion- does not tolerate well    Diclofenac Sodium Diarrhea   Hydralazine Other (See Comments)   Per family, every diuretic the patient has been on flushes out her out and ultimately results in CRAMPING/JAUNDICE (added potassium might help)   Methylprednisolone Other (See Comments)    Medrol/CORTICOSTEROIDS = A LOT OF ENERGY   Oxycodone Other (See Comments)   Caused patient to "act crazy"        Medication List     STOP taking these medications    fenofibrate 160 MG tablet   metoprolol tartrate 50 MG tablet Commonly known as: LOPRESSOR   traMADol 50 MG tablet Commonly known as: ULTRAM       TAKE these medications    acetaminophen 500 MG tablet Commonly known as: TYLENOL Take 500 mg by mouth 3 (three) times daily as needed for headache (pain). Take with tramadol   atorvastatin 40 MG tablet Commonly known as: LIPITOR TAKE 1 TABLET BY MOUTH DAILY AT 6 PM. What changed: when to take this   cholecalciferol 25 MCG (1000 UNIT) tablet Commonly known as: VITAMIN D3 Take 1,000 Units by mouth 2 (two) times daily.   Co Q-10 300 MG Caps Take 300 mg by mouth in the morning.   [employer] Aspirin Adult Low Dose 81 MG chewable tablet Generic drug: aspirin CHEW 1 TABLET (81 MG TOTAL) BY MOUTH DAILY. What changed:  how much to take when to take this   Eliquis 5 MG Tabs tablet Generic  drug: apixaban TAKE 1 TABLET BY MOUTH TWICE A DAY What changed: how much to take   ferrous gluconate 324 MG tablet Commonly known as: FERGON TAKE 1 TABLET BY MOUTH DAILY WITH BREAKFAST What changed: when to take this   HM LIDOCAINE PATCH EX Apply 1 patch topically daily as needed (for back pain).   Lidocaine-Menthol (Spray) 4-1 % Liqd Apply 1 spray topically daily as needed (back/knee pain/restless legs).   losartan 25 MG tablet Commonly known as: COZAAR Take 1 tablet (25 mg total) by mouth daily. Start taking on: May 09, 2021 What changed:  medication strength how much to take   Magnesium 200 MG Tabs Take 200 mg by mouth every morning.   metoprolol succinate 100 MG 24 hr tablet Commonly known as: TOPROL-XL Take 1 tablet (100 mg total) by mouth daily. Take with or immediately following a meal. Start taking on: May 09, 2021   multivitamin with minerals  Tabs tablet Take 1 tablet by mouth 2 (two) times daily. Centrum Minis   pantoprazole 40 MG tablet Commonly known as: Protonix Take 1 tablet (40 mg total) by mouth daily.   potassium chloride 20 MEQ/15ML (10%) Soln Take 5 mLs (6.7 mEq total) by mouth See admin instructions. Take 5 mls (6.7 meq) by mouth every morning, take another 5 mls (6.7 meq) with every extra dose of furosemide   senna-docusate 8.6-50 MG tablet Commonly known as: Senokot-S Take 1 tablet by mouth 2 (two) times daily between meals as needed for mild constipation.   sodium chloride 0.65 % Soln nasal spray Commonly known as: OCEAN Place 1 spray into both nostrils daily as needed for congestion.   terazosin 10 MG capsule Commonly known as: HYTRIN Take 1 capsule (10 mg total) by mouth daily at 12 noon. What changed: when to take this   torsemide 10 MG tablet Commonly known as: Demadex Take 3 tablets (30 mg total) by mouth daily. What changed: how much to take   vitamin B-12 1000 MCG tablet Commonly known as: CYANOCOBALAMIN Take 1 tablet (1,000 mcg total) by mouth daily.               Discharge Care Instructions  (From admission, onward)           Start     Ordered   05/08/21 0000  Discharge wound care:       Comments: Continue current managemen.   05/08/21 1539            Follow-up Information     Nafziger, Tommi Rumps, NP. Go on 05/13/2021.   Specialty: Family Medicine Why: @2 :30pm Contact information: [place] [address] [phone]         Burnell Blanks, MD .   Specialty: Cardiology Contact information: [address] [place] [address] [phone]         [facility] Follow up.   Why: QGBE,EFEO   [phone]                Signed: Bonnell Public 05/08/2021, 3:40 PM

## 2021-05-08 NOTE — TOC Transition Note (Signed)
Transition of Care [facility]) - CM/SW Discharge Note   Patient Details  Name: Tara Fields MRN: [id number] Date of Birth: 21-Dec-1927  Transition [facility] Care [facility]) CM/SW Contact:  Zenon Mayo, RN Phone Number: 05/08/2021, 3:28 PM   Clinical Narrative:    NCM spoke with patient at bedside, she is from [place], she is active with [facility] for HHRN , HHPT, NCM offered choice, she would like to continue with them. She uses a walker at home and she has a w/chair and a walk in shower.  Son will transport her at [employer], he takes her to [facility], she has two sons one calls her in the am to make sure she has taken her meds and one calls her at night to make sure she has taken her meds.  NCM notified Ken[place] with [facility] that patient is for possible dc today.     Final next level of care: [place] Barriers to Discharge: No Barriers Identified   Patient Goals and CMS Choice Patient states their goals for this hospitalization and ongoing recovery are:: return home CMS Medicare.gov Compare Post Acute Care list provided to:: Patient Choice offered to / list presented to : Patient  Discharge Placement                       Discharge Plan and Services   Discharge Planning Services: CM Consult Post Acute Care Choice: Home Health            DME Agency: NA       HH Arranged: RN, PT [facility] Agency: [place] ([place]) Date HH Agency Contacted: 05/07/21 Time [place]: 1626 Representative spoke with at [place]: [place] ([place]) Interventions     Readmission Risk Interventions Readmission Risk Prevention Plan 05/07/2021 04/16/2021  Transportation Screening Complete Complete  PCP or Specialist Appt within 3-5 Days - Complete  HRI or [place] - Complete  Palliative Care Screening - Not Applicable  Medication Review ([place]) Complete -  PCP or Specialist appointment within 3-5 days of discharge  Complete -  [place] or Home Care Consult Complete -  Palliative Care Screening Not Applicable -  [place] Not Applicable -  Some recent data might be hidden

## 2021-05-11 ENCOUNTER — Telehealth: Payer: Self-pay

## 2021-05-11 ENCOUNTER — Encounter: Payer: Self-pay | Admitting: Adult Health

## 2021-05-11 NOTE — Telephone Encounter (Signed)
[name] from [facility] called requesting verbal orders for home health 1x a week for 6x weeks. Call back # [phone] [name] stated ok to leave message.

## 2021-05-12 ENCOUNTER — Other Ambulatory Visit: Payer: Self-pay

## 2021-05-12 ENCOUNTER — Telehealth: Payer: Self-pay

## 2021-05-12 NOTE — Progress Notes (Signed)
Cardiology Office Note    Date:  05/19/2021   ID:  Tara Fields, DOB [birth year], MRN [id number]   PCP:  Tara Peng, NP   [place]  Cardiologist:  Tara Chandler, MD   Advanced Practice Provider:  No care team member to display Electrophysiologist:  None   [phone]   Chief Complaint  Patient presents with   Hospitalization Follow-up    History of Present Illness:  Tara Fields is a 87 y.o. female  with history of severe AS s/p TAVR at [place] 03/07/2019, She developed a pseudoaneurysm post TAVR and had thrombin injection. She has atrial fibrillation and is on Eliquis. She has carotid artery disease. She says that no stent was placed in her carotid artery. Cardiac cath March 2019 at [facility] with 25% mid LAD stenosis, 50% stenosis second diagonal branch, 50% distal Circumflex stenosis, 75% ostial stenosis of a small Diagonal branch. Echo September 2020 at [place] with LVEF=45-50%. Moderate MR. Bioprosthetic aortic valve working well. Repeat echo at [place] July 2021 with no change, Trivial perivalvular leak noted. She was admitted to [facility] 12/19/20 with acute systolic CHF. Echo 12/20/20 with LVEF=25-30%. No ischemic evaluation was planned per discussion with Dr. Percival Fields and the patient. She was diuresed with IV Lasix. Norvasc was stopped. She was seen in the ED 01/04/21 with c/o dyspnea. BNP was elevated at 1852 but overall unchanged over last few checks over the last few months. Troponin negative. Lasix increased to 20 mg po BID. COPD, CAD, GERD, persistent atrial fibrillation, DM, HTN, HLD, tobacco abuse and diverticulitis    Patient discharged 01/31/2021 after admission for CHF treated with IV Lasix.    I saw the patient 04/01/2021 when she was doing well on low-dose Lasix.  Unfortunately she was readmitted 05/05/2021 with recurrent CHF and hypoxia to the 80s.  BNP was 2600 she diuresed with IV Lasix and she was discharged home on torsemide creatinine 1.01 at  discharge.  Echo was not repeated.  Patient comes in with son. She's complaining of significant knee pain. Weight and edema is stable. Many questions answered.      Past Medical History:  Diagnosis Date   Aortic stenosis    s/p TAVR 26 mm Edwards Sapien 3 THV July 2020   CAD (coronary artery disease)    COPD (chronic obstructive pulmonary disease) (HCC)    Diabetes mellitus (HCC)    Diverticulitis    GERD (gastroesophageal reflux disease)    HTN (hypertension)    Hyperlipidemia    Legally blind in left eye, as defined in [place]    Tobacco abuse     Past Surgical History:  Procedure Laterality Date   ABDOMINAL HYSTERECTOMY     APPENDECTOMY     BACK SURGERY     CATARACT EXTRACTION, BILATERAL     CHOLECYSTECTOMY     COLONOSCOPY WITH PROPOFOL N/A 04/16/2021   Procedure: COLONOSCOPY WITH PROPOFOL;  Surgeon: Milus Banister, MD;  Location: [facility];  Service: Gastroenterology;  Laterality: N/A;   ESOPHAGOGASTRODUODENOSCOPY (EGD) WITH PROPOFOL N/A 04/15/2021   Procedure: ESOPHAGOGASTRODUODENOSCOPY (EGD) WITH PROPOFOL;  Surgeon: Doran Stabler, MD;  Location: [facility];  Service: Gastroenterology;  Laterality: N/A;    Current Medications: Current Meds  Medication Sig   acetaminophen (TYLENOL) 500 MG tablet Take 500 mg by mouth 3 (three) times daily as needed for headache (pain). Take with tramadol   atorvastatin (LIPITOR) 40 MG tablet TAKE 1 TABLET BY MOUTH DAILY AT 6 PM.   cholecalciferol (  VITAMIN D3) 25 MCG (1000 UNIT) tablet Take 1,000 Units by mouth 2 (two) times daily.   Coenzyme Q10 (CO Q-10) 300 MG CAPS Take 300 mg by mouth in the morning.   [employer] ASPIRIN ADULT LOW DOSE 81 MG chewable tablet CHEW 1 TABLET (81 MG TOTAL) BY MOUTH DAILY.   ELIQUIS 5 MG TABS tablet TAKE 1 TABLET BY MOUTH TWICE A DAY   ferrous gluconate (FERGON) 324 MG tablet TAKE 1 TABLET BY MOUTH DAILY WITH BREAKFAST   HM LIDOCAINE PATCH EX Apply 1 patch topically daily as needed (for back pain).    Lidocaine-Menthol, Spray, 4-1 % LIQD Apply 1 spray topically daily as needed (back/knee pain/restless legs).   losartan (COZAAR) 25 MG tablet Take 1 tablet (25 mg total) by mouth daily.   Magnesium 200 MG TABS Take 200 mg by mouth every morning.   metoprolol succinate (TOPROL-XL) 100 MG 24 hr tablet Take 1 tablet (100 mg total) by mouth daily. Take with or immediately following a meal.   Multiple Vitamin (MULTIVITAMIN WITH MINERALS) TABS tablet Take 1 tablet by mouth 2 (two) times daily. Centrum Minis   pantoprazole (PROTONIX) 40 MG tablet Take 1 tablet (40 mg total) by mouth daily.   senna-docusate (SENOKOT-S) 8.6-50 MG tablet Take 1 tablet by mouth 2 (two) times daily between meals as needed for mild constipation.   sodium chloride (OCEAN) 0.65 % SOLN nasal spray Place 1 spray into both nostrils daily as needed for congestion.   terazosin (HYTRIN) 10 MG capsule Take 1 capsule (10 mg total) by mouth daily at 12 noon.   traMADol (ULTRAM) 50 MG tablet Take 50 mg by mouth every 6 (six) hours as needed for severe pain.   vitamin B-12 (CYANOCOBALAMIN) 1000 MCG tablet Take 1 tablet (1,000 mcg total) by mouth daily.   [DISCONTINUED] potassium chloride 20 MEQ/15ML (10%) SOLN 15 ml by mouth daily   [DISCONTINUED] torsemide (DEMADEX) 10 MG tablet Take 3 tablets (30 mg total) by mouth daily.     Allergies:   Amlodipine besy-benazepril hcl, Hctz [hydrochlorothiazide], Other, Cyclobenzaprine, Diclofenac sodium, Hydralazine, Methylprednisolone, and Oxycodone   Social History   Socioeconomic History   Marital status: Widowed    Spouse name: Not on file   Number of children: 2   Years of education: Not on file   Highest education level: High school graduate  Occupational History   Occupation: Retired    Comment: homemaker  Tobacco Use   Smoking status: Former    Packs/day: 1.00    Years: 70.00    Pack years: 70.00    Types: Cigarettes    Quit date: 12/15/2019    Years since quitting: 1.4    Smokeless tobacco: Never   Tobacco comments:    Pt states quiting a year ago. 01/03/20 [facility]  Vaping Use   Vaping Use: Never used  Substance and Sexual Activity   Alcohol use: No    Alcohol/week: 0.0 standard drinks   Drug use: No   Sexual activity: Not Currently  Other Topics Concern   Not on file  Social History Narrative   Widowed- was married for 65 years    Has two sons       Social Determinants of Radio broadcast assistant Strain: Low Risk    Difficulty of Paying Living Expenses: Not hard at all  Food Insecurity: No Food Insecurity   Worried About Charity fundraiser in the Last Year: Never true   [employer] of Food in the  Last Year: Never true  Transportation Needs: No Transportation Needs   Lack of Transportation (Medical): No   Lack of Transportation (Non-Medical): No  Physical Activity: Insufficiently Active   Days of Exercise per Week: 5 days   Minutes of Exercise per Session: 10 min  Stress: No Stress Concern Present   Feeling of Stress : Not at all  Social Connections: Socially Isolated   Frequency of Communication with Friends and Family: Three times a week   Frequency of Social Gatherings with Friends and Family: Three times a week   Attends Religious Services: Never   Active Member of Clubs or Organizations: No   Attends Archivist Meetings: Never   Marital Status: Widowed     Family History:  The patient's  family history includes Cerebral palsy in her brother; Diabetes in her mother; Stroke in her brother.   ROS:   Please see the history of present illness.    ROS All other systems reviewed and are negative.   PHYSICAL EXAM:   VS:  BP 126/72   Pulse 85   Ht 5' (1.524 m)   Wt 144 lb 3.2 oz (65.4 kg)   SpO2 98%   BMI 28.16 kg/m   Physical Exam  GEN: Thin, in no acute distress  Neck: no JVD, carotid bruits, or masses Cardiac:RRR; no murmurs, rubs, or gallops  Respiratory:  clear to auscultation bilaterally, normal work of breathing GI:  soft, nontender, nondistended, + BS Ext: without cyanosis, clubbing, or edema, Good distal pulses bilaterally Neuro:  Alert and Oriented x 3 Psych: euthymic mood, full affect  Wt Readings from Last 3 Encounters:  05/19/21 144 lb 3.2 oz (65.4 kg)  05/13/21 143 lb (64.9 kg)  05/08/21 143 lb 8.3 oz (65.1 kg)      Studies/Labs Reviewed:   EKG:  EKG is not ordered today.     Recent Labs: 05/08/2021: B Natriuretic Peptide 1,904.4; Magnesium 2.0 05/13/2021: ALT 18; BUN 45; Creatinine, Ser 1.25; Hemoglobin 10.8; Platelets 304.0; Potassium 4.3; Sodium 138   Lipid Panel    Component Value Date/Time   CHOL 94 05/08/2021 0234   TRIG 57 05/08/2021 0234   HDL 34 (L) 05/08/2021 0234   CHOLHDL 2.8 05/08/2021 0234   VLDL 11 05/08/2021 0234   LDLCALC 49 05/08/2021 0234    Additional studies/ records that were reviewed today include:  2D echo 12/20/2020 IMPRESSIONS     1. There is prominent septal-lateral left ventricular dyssynchrony due to  LBBB. Left ventricular ejection fraction, by estimation, is 25 to 30%. The  left ventricle has severely decreased function. The left ventricle  demonstrates global hypokinesis. The  left ventricular internal cavity size was mildly dilated. Left ventricular  diastolic function could not be evaluated.   2. Right ventricular systolic function is normal. The right ventricular  size is normal. There is normal pulmonary artery systolic pressure.   3. Left atrial size was severely dilated.   4. The mitral valve is normal in structure. Mild mitral valve  regurgitation. No evidence of mitral stenosis.   5. Mild perivalvular leak. The aortic valve has been repaired/replaced.  Aortic valve regurgitation is mild. There is a 26 mm Sapien prosthetic  (TAVR) valve present in the aortic position. Procedure Date: 03/07/2019 at  [facility]. Aortic regurgitation  PHT measures 401 msec. Aortic valve mean gradient measures 12.2 mmHg.  Aortic valve Vmax measures 2.46  m/s. Aortic valve acceleration time  measures 76 msec.   6. The inferior vena cava  is normal in size with greater than 50%  respiratory variability, suggesting right atrial pressure of 3 mmHg.   Comparison(s): A prior study was performed on 03/25/2020. Prior images  unable to be directly viewed, comparison made by report only. The left  ventricular function is significantly worse. 03/25/2020 study at [place] reports LVEF 45-50%,  similar TAVR gradients and mild perivalvular leak. LBBB is not mentioned  on that report.      Risk Assessment/Calculations:    CHA2DS2-VASc Score = 7   This indicates a 11.2% annual risk of stroke. The patient's score is based upon: CHF History: 1 HTN History: 1 Diabetes History: 1 Stroke History: 0 Vascular Disease History: 1 Age Score: 2 Gender Score: 1        ASSESSMENT:    1. Nonrheumatic aortic valve stenosis   2. Permanent atrial fibrillation ([place])   3. Chronic systolic CHF (congestive heart failure) (HCC)   4. Stage 3 chronic kidney disease, unspecified whether stage 3a or 3b CKD ([place])   5. Coronary artery disease involving native coronary artery of native heart without angina pectoris   6. Bilateral carotid artery stenosis      PLAN:  In order of problems listed above:  Severe AS status post TAVR at [place] 03/07/2019-still on baby ASA asymptomatic   PAF on Eliquis-no bleeding problems. hgb dropped in hospital and required a transfusion, now coming up with Vit W23   Chronic systolic CHF LVEF 25 to 76% on echo 12/2020 patient has declined ischemic evaluation.  Readmitted 04/2021 with CHF diuresed with IV Lasix and switch to torsemide.well compensated today. Continue current treatment. Check bmet in 2 weeks  CKD Crt 1.25 05/13/21-recheck in 2 weeks.if continues to climb may need to reduce torsemide and refer to renal.   CAD mild to moderate on cath in 2019-no angina   Carotid artery disease followed at [place] with prior right  carotid artery stenting    Shared Decision Making/Informed Consent        Medication Adjustments/Labs and Tests Ordered: Current medicines are reviewed at length with the patient today.  Concerns regarding medicines are outlined above.  Medication changes, Labs and Tests ordered today are listed in the Patient Instructions below. Patient Instructions  Medication Instructions:  Your physician recommends that you continue on your current medications as directed. Please refer to the Current Medication list given to you today.  *If you need a refill on your cardiac medications before your next appointment, please call your pharmacy*   Lab Work: BMET in 2 weeks If you have labs (blood work) drawn today and your tests are completely normal, you will receive your results only by: [place] (if you have MyChart) OR A paper copy in the mail If you have any lab test that is abnormal or we need to change your treatment, we will call you to review the results.   Follow-Up: At [facility], you and your health needs are our priority.  As part of our continuing mission to provide you with exceptional heart care, we have created designated Provider Care Teams.  These Care Teams include your primary Cardiologist (physician) and Advanced Practice Providers (APPs -  Physician Assistants and Nurse Practitioners) who all work together to provide you with the care you need, when you need it.   Your next appointment:   As scheduled   Signed, Ermalinda Barrios, PA-C  05/19/2021 2:13 PM    [place], [place]  [address] Phone: [phone]; Fax: [phone]

## 2021-05-12 NOTE — Telephone Encounter (Signed)
Pt is scheduled for a hosp f/u on 10/4 and will get BMET that day.

## 2021-05-12 NOTE — Telephone Encounter (Signed)
Transition Care Management Unsuccessful Follow-up Telephone Call  Date of discharge and from where:  05/08/21 from [facility]  Attempts:  1st Attempt  Reason for unsuccessful TCM follow-up call:  No answer/busy   Thea Silversmith, RN, MSN, BSN, [place] Care Management Coordinator [phone]

## 2021-05-13 ENCOUNTER — Ambulatory Visit (INDEPENDENT_AMBULATORY_CARE_PROVIDER_SITE_OTHER): Payer: Medicare Other | Admitting: Adult Health

## 2021-05-13 ENCOUNTER — Telehealth: Payer: Self-pay

## 2021-05-13 ENCOUNTER — Encounter: Payer: Self-pay | Admitting: Adult Health

## 2021-05-13 VITALS — BP 120/68 | HR 99 | Temp 97.3°F | Wt 143.0 lb

## 2021-05-13 DIAGNOSIS — I4819 Other persistent atrial fibrillation: Secondary | ICD-10-CM

## 2021-05-13 DIAGNOSIS — M159 Polyosteoarthritis, unspecified: Secondary | ICD-10-CM

## 2021-05-13 DIAGNOSIS — I5023 Acute on chronic systolic (congestive) heart failure: Secondary | ICD-10-CM

## 2021-05-13 DIAGNOSIS — E11638 Type 2 diabetes mellitus with other oral complications: Secondary | ICD-10-CM | POA: Diagnosis not present

## 2021-05-13 DIAGNOSIS — N1831 Chronic kidney disease, stage 3a: Secondary | ICD-10-CM

## 2021-05-13 DIAGNOSIS — M8949 Other hypertrophic osteoarthropathy, multiple sites: Secondary | ICD-10-CM

## 2021-05-13 DIAGNOSIS — I10 Essential (primary) hypertension: Secondary | ICD-10-CM | POA: Diagnosis not present

## 2021-05-13 LAB — CBC WITH DIFFERENTIAL/PLATELET
Basophils Absolute: 0 10*3/uL (ref 0.0–0.1)
Basophils Relative: 0.8 % (ref 0.0–3.0)
Eosinophils Absolute: 0.2 10*3/uL (ref 0.0–0.7)
Eosinophils Relative: 3.4 % (ref 0.0–5.0)
HCT: 33.4 % — ABNORMAL LOW (ref 36.0–46.0)
Hemoglobin: 10.8 g/dL — ABNORMAL LOW (ref 12.0–15.0)
Lymphocytes Relative: 18.1 % (ref 12.0–46.0)
Lymphs Abs: 0.8 10*3/uL (ref 0.7–4.0)
MCHC: 32.3 g/dL (ref 30.0–36.0)
MCV: 90.6 fl (ref 78.0–100.0)
Monocytes Absolute: 0.5 10*3/uL (ref 0.1–1.0)
Monocytes Relative: 11 % (ref 3.0–12.0)
Neutro Abs: 3 10*3/uL (ref 1.4–7.7)
Neutrophils Relative %: 66.7 % (ref 43.0–77.0)
Platelets: 304 10*3/uL (ref 150.0–400.0)
RBC: 3.68 Mil/uL — ABNORMAL LOW (ref 3.87–5.11)
RDW: 16 % — ABNORMAL HIGH (ref 11.5–15.5)
WBC: 4.5 10*3/uL (ref 4.0–10.5)

## 2021-05-13 LAB — COMPREHENSIVE METABOLIC PANEL
ALT: 18 U/L (ref 0–35)
AST: 30 U/L (ref 0–37)
Albumin: 3.8 g/dL (ref 3.5–5.2)
Alkaline Phosphatase: 48 U/L (ref 39–117)
BUN: 45 mg/dL — ABNORMAL HIGH (ref 6–23)
CO2: 27 mEq/L (ref 19–32)
Calcium: 9.4 mg/dL (ref 8.4–10.5)
Chloride: 103 mEq/L (ref 96–112)
Creatinine, Ser: 1.25 mg/dL — ABNORMAL HIGH (ref 0.40–1.20)
GFR: 37.75 mL/min — ABNORMAL LOW (ref >60.00)
Glucose, Bld: 99 mg/dL (ref 70–99)
Potassium: 4.3 mEq/L (ref 3.5–5.1)
Sodium: 138 mEq/L (ref 135–145)
Total Bilirubin: 0.5 mg/dL (ref 0.2–1.2)
Total Protein: 6.6 g/dL (ref 6.0–8.3)

## 2021-05-13 NOTE — Progress Notes (Signed)
Subjective:    Patient ID: Tara Fields, female    DOB: 05/01/1928, 87 y.o.   MRN: [id number]  HPI 87 year old female who  has a past medical history of Aortic stenosis, CAD (coronary artery disease), COPD (chronic obstructive pulmonary disease) ([place]), Diabetes mellitus ([place]), Diverticulitis, GERD (gastroesophageal reflux disease), HTN (hypertension), Hyperlipidemia, Legally blind in left eye, as defined in [place], and Tobacco abuse.  She is being evaluated today for TCM visit   Admit Date 05/05/2021 Discharge Date 05/08/2021  Recent hospital admission from 8/29 to 04/16/2021 for acute blood loss anemia/GI bleed with EGD showing erosive gastropathy and colonoscopy showing diverticulitis.  She was readmitted on 05/05/2021 with progressive shortness of breath from acute respiratory failure with hypoxia to lower 80s due to acute on chronic systolic CHF, chest tightness, orthopnea, and weight gain did not improve by changing her Lasix to torsemide.  She was admitted and managed with IV Lasix.  With adequate diuresis, the patient's symptoms improved significantly and she was back to baseline for discharge.  Hospital Course  Acute hypoxic respiratory failure/acute on chronic systolic CHF -Desaturated to low 80s on presentation -TTE in May 2022 with LVEF of 25 to 30%, indeterminant DD, patient status post TAVR. -BNP elevated to 2600 and CXR with pulmonary edema on presentation.  Diuresed with IV Lasix with improvement in her symptoms and fluid status.  Weight was down 5 to 10 pounds, net -2.8 L.  Creatinine slightly up but stable -She was discharged on oral torsemide -Continue metoprolol (changer from 50 mg BID to 100 mg XR) and low-dose losartan( changed from 100 mg to 25 mg) -Advised of sodium and fluid restrictions  Controlled diabetes type 2 with CKD 3 A Lab Results  Component Value Date   HGBA1C 5.0 04/29/2021   AKI on CKD  -AKI thought to be secondary to cardiorenal syndrome. -Serum  creatinine improved with diuresis -Continue to monitor renal function, electrolytes, and volume status  History of severe AS -Status post TAVR at [facility] in July 2020  History of CAD -TAVR LHC at [facility] in 2019 with nonobstructive CAD.  Stable -Continue metoprolol, statin, and aspirin -Continued fenofibrate due to high risk for myopathy with statin  PAF -Was in sinus rhythm with rate control during hospital admission.  Continued on metoprolol and Eliquis  Wt Readings from Last 3 Encounters:  05/13/21 143 lb (64.9 kg)  05/08/21 143 lb 8.3 oz (65.1 kg)  04/29/21 150 lb (68 kg)   Today she reports that her breathing is back to baseline and has not had any fluid build up in her legs since she has been discharged. Her weight has stayed stable around 143 lbs. She has no acute complaints except wondering why Tramadol was discontinued in the office - she does not take this frequently but does need it for her osteoarthritic pain    Review of Systems  Constitutional: Negative.   HENT: Negative.    Eyes: Negative.   Respiratory:  Positive for shortness of breath (chronic).   Cardiovascular: Negative.   Gastrointestinal: Negative.   Endocrine: Negative.   Genitourinary: Negative.   Musculoskeletal: Negative.   Skin: Negative.   Allergic/Immunologic: Negative.   Neurological: Negative.   Hematological: Negative.   Psychiatric/Behavioral: Negative.     Past Medical History:  Diagnosis Date   Aortic stenosis    s/p TAVR 26 mm Edwards Sapien 3 THV July 2020   CAD (coronary artery disease)    COPD (chronic obstructive pulmonary disease) ([place])  Diabetes mellitus ([place])    Diverticulitis    GERD (gastroesophageal reflux disease)    HTN (hypertension)    Hyperlipidemia    Legally blind in left eye, as defined in [place]    Tobacco abuse     Social History   Socioeconomic History   Marital status: Widowed    Spouse name: Not on file   Number of children: 2   Years of education:  Not on file   Highest education level: High school graduate  Occupational History   Occupation: Retired    Comment: homemaker  Tobacco Use   Smoking status: Former    Packs/day: 1.00    Years: 70.00    Pack years: 70.00    Types: Cigarettes    Quit date: 12/15/2019    Years since quitting: 1.4   Smokeless tobacco: Never   Tobacco comments:    Pt states quiting a year ago. 01/03/20 ARJ  Vaping Use   Vaping Use: Never used  Substance and Sexual Activity   Alcohol use: No    Alcohol/week: 0.0 standard drinks   Drug use: No   Sexual activity: Not Currently  Other Topics Concern   Not on file  Social History Narrative   Widowed- was married for 19 years    Has two sons       Social Determinants of Radio broadcast assistant Strain: Low Risk    Difficulty of Paying Living Expenses: Not hard at all  Food Insecurity: No Food Insecurity   Worried About Charity fundraiser in the Last Year: Never true   Arboriculturist in the Last Year: Never true  Transportation Needs: No Transportation Needs   Lack of Transportation (Medical): No   Lack of Transportation (Non-Medical): No  Physical Activity: Insufficiently Active   Days of Exercise per Week: 5 days   Minutes of Exercise per Session: 10 min  Stress: No Stress Concern Present   Feeling of Stress : Not at all  Social Connections: Socially Isolated   Frequency of Communication with Friends and Family: Three times a week   Frequency of Social Gatherings with Friends and Family: Three times a week   Attends Religious Services: Never   Active Member of Clubs or Organizations: No   Attends Archivist Meetings: Never   Marital Status: Widowed  Human resources officer Violence: Not At Risk   Fear of Current or Ex-Partner: No   Emotionally Abused: No   Physically Abused: No   Sexually Abused: No    Past Surgical History:  Procedure Laterality Date   ABDOMINAL HYSTERECTOMY     APPENDECTOMY     BACK SURGERY     CATARACT  EXTRACTION, BILATERAL     CHOLECYSTECTOMY     COLONOSCOPY WITH PROPOFOL N/A 04/16/2021   Procedure: COLONOSCOPY WITH PROPOFOL;  Surgeon: Milus Banister, MD;  Location: [facility];  Service: Gastroenterology;  Laterality: N/A;   ESOPHAGOGASTRODUODENOSCOPY (EGD) WITH PROPOFOL N/A 04/15/2021   Procedure: ESOPHAGOGASTRODUODENOSCOPY (EGD) WITH PROPOFOL;  Surgeon: Doran Stabler, MD;  Location: [facility];  Service: Gastroenterology;  Laterality: N/A;    Family History  Problem Relation Age of Onset   Diabetes Mother    Stroke Brother    Cerebral palsy Brother    Coronary artery disease Neg Hx     Allergies  Allergen Reactions   Amlodipine Besy-Benazepril Hcl Cough   Hctz [Hydrochlorothiazide] Other (See Comments)    Per family, every diuretic the patient has been  on flushes out her out and ultimately results in CRAMPING/JAUNDICE (added potassium might help)   Other Other (See Comments)    History of diverticulitis- CANNOT TOLERATE NUTS, CORN, AND ANY FOODS NOT EASILY DIGESTED- the patient AVOIDS these   Cyclobenzaprine Other (See Comments)    Confusion- does not tolerate well    Diclofenac Sodium Diarrhea   Hydralazine Other (See Comments)    Per family, every diuretic the patient has been on flushes out her out and ultimately results in CRAMPING/JAUNDICE (added potassium might help)   Methylprednisolone Other (See Comments)    Medrol/CORTICOSTEROIDS = A LOT OF ENERGY   Oxycodone Other (See Comments)    Caused patient to "act crazy"    Current Outpatient Medications on File Prior to Visit  Medication Sig Dispense Refill   traMADol (ULTRAM) 50 MG tablet Take 50 mg by mouth every 6 (six) hours as needed for severe pain.     acetaminophen (TYLENOL) 500 MG tablet Take 500 mg by mouth 3 (three) times daily as needed for headache (pain). Take with tramadol     atorvastatin (LIPITOR) 40 MG tablet TAKE 1 TABLET BY MOUTH DAILY AT 6 PM. (Patient taking differently: Take 40 mg by mouth  every evening.) 90 tablet 1   cholecalciferol (VITAMIN D3) 25 MCG (1000 UNIT) tablet Take 1,000 Units by mouth 2 (two) times daily.     Coenzyme Q10 (CO Q-10) 300 MG CAPS Take 300 mg by mouth in the morning.     [facility] ASPIRIN ADULT LOW DOSE 81 MG chewable tablet CHEW 1 TABLET (81 MG TOTAL) BY MOUTH DAILY. (Patient taking differently: Chew 81 mg by mouth every morning.) 90 tablet 4   ELIQUIS 5 MG TABS tablet TAKE 1 TABLET BY MOUTH TWICE A DAY (Patient taking differently: Take 5 mg by mouth 2 (two) times daily.) 180 tablet 1   ferrous gluconate (FERGON) 324 MG tablet TAKE 1 TABLET BY MOUTH DAILY WITH BREAKFAST (Patient taking differently: Take 324 mg by mouth every morning.) 90 tablet 1   HM LIDOCAINE PATCH EX Apply 1 patch topically daily as needed (for back pain).     Lidocaine-Menthol, Spray, 4-1 % LIQD Apply 1 spray topically daily as needed (back/knee pain/restless legs).     losartan (COZAAR) 25 MG tablet Take 1 tablet (25 mg total) by mouth daily. 30 tablet 0   Magnesium 200 MG TABS Take 200 mg by mouth every morning.     metoprolol succinate (TOPROL-XL) 100 MG 24 hr tablet Take 1 tablet (100 mg total) by mouth daily. Take with or immediately following a meal. 30 tablet 0   Multiple Vitamin (MULTIVITAMIN WITH MINERALS) TABS tablet Take 1 tablet by mouth 2 (two) times daily. Centrum Minis     pantoprazole (PROTONIX) 40 MG tablet Take 1 tablet (40 mg total) by mouth daily. 30 tablet 0   potassium chloride 20 MEQ/15ML (10%) SOLN Take 5 mLs (6.7 mEq total) by mouth See admin instructions. Take 5 mls (6.7 meq) by mouth every morning, take another 5 mls (6.7 meq) with every extra dose of furosemide 675 mL 3   senna-docusate (SENOKOT-S) 8.6-50 MG tablet Take 1 tablet by mouth 2 (two) times daily between meals as needed for mild constipation. 180 tablet 0   sodium chloride (OCEAN) 0.65 % SOLN nasal spray Place 1 spray into both nostrils daily as needed for congestion.     terazosin (HYTRIN) 10 MG capsule  Take 1 capsule (10 mg total) by mouth daily at 12 noon. (  Patient taking differently: Take 10 mg by mouth in the morning.) 90 capsule 1   torsemide (DEMADEX) 10 MG tablet Take 3 tablets (30 mg total) by mouth daily. 90 tablet 0   vitamin B-12 (CYANOCOBALAMIN) 1000 MCG tablet Take 1 tablet (1,000 mcg total) by mouth daily. 30 tablet 3   No current facility-administered medications on file prior to visit.    BP 120/68   Pulse 99   Temp (!) 97.3 F (36.3 C)   Wt 143 lb (64.9 kg)   SpO2 94%   BMI 27.93 kg/m        Objective:   Physical Exam Vitals and nursing note reviewed.  Constitutional:      Appearance: Normal appearance.  Cardiovascular:     Rate and Rhythm: Normal rate and regular rhythm.     Pulses: Normal pulses.     Heart sounds: Normal heart sounds.  Pulmonary:     Effort: Pulmonary effort is normal.     Breath sounds: Normal breath sounds.  Musculoskeletal:     Right lower leg: No edema.  Skin:    General: Skin is warm and dry.     Capillary Refill: Capillary refill takes less than 2 seconds.  Neurological:     General: No focal deficit present.     Mental Status: She is alert and oriented to person, place, and time.     Gait: Gait abnormal (in wheelchair for exam).  Psychiatric:        Mood and Affect: Mood normal.        Behavior: Behavior normal.        Thought Content: Thought content normal.        Judgment: Judgment normal.      Assessment & Plan:   1. Acute on chronic systolic CHF (congestive heart failure) ([place]) - Reviewed hospital notes, discharge instructions, labs and imaging. All questions answered to the best of my ability  - Comprehensive metabolic panel; Future - CBC with Differential/Platelet; Future  2. Other persistent atrial fibrillation ([place]) - Continue with Metoprolol and eliquis - Comprehensive metabolic panel; Future - CBC with Differential/Platelet; Future  3. Essential hypertension - Controlled.  - Comprehensive metabolic  panel; Future - CBC with Differential/Platelet; Future   4. Type 2 diabetes mellitus with other oral complication, without long-term current use of insulin (H[facility]) - Stable - Comprehensive metabolic panel; Future - CBC with Differential/Platelet; Future  5. Stage 3a chronic kidney disease (H[facility]) - Stable  - Comprehensive metabolic panel; Future - CBC with Differential/Platelet; Future  6. Primary osteoarthritis involving multiple joints - Will add back Tramadol 50 mg to her medication list    [name], NP

## 2021-05-13 NOTE — Telephone Encounter (Signed)
Transition Care Management Unsuccessful Follow-up Telephone Call  Date of discharge and from where:  05/08/21 from [facility]  Attempts:  2nd Attempt  Reason for unsuccessful TCM follow-up call:  No answer/busy   Thea Silversmith, RN, MSN, BSN, [place] Care Management Coordinator [phone]

## 2021-05-13 NOTE — Telephone Encounter (Signed)
This is Cory's patient  

## 2021-05-19 ENCOUNTER — Encounter: Payer: Self-pay | Admitting: Physician Assistant

## 2021-05-19 ENCOUNTER — Other Ambulatory Visit: Payer: Self-pay

## 2021-05-19 ENCOUNTER — Ambulatory Visit (INDEPENDENT_AMBULATORY_CARE_PROVIDER_SITE_OTHER): Payer: Medicare Other | Admitting: Physician Assistant

## 2021-05-19 VITALS — BP 126/72 | HR 85 | Ht 60.0 in | Wt 144.2 lb

## 2021-05-19 DIAGNOSIS — I6523 Occlusion and stenosis of bilateral carotid arteries: Secondary | ICD-10-CM | POA: Diagnosis not present

## 2021-05-19 DIAGNOSIS — N183 Chronic kidney disease, stage 3 unspecified: Secondary | ICD-10-CM

## 2021-05-19 DIAGNOSIS — I4821 Permanent atrial fibrillation: Secondary | ICD-10-CM

## 2021-05-19 DIAGNOSIS — I5022 Chronic systolic (congestive) heart failure: Secondary | ICD-10-CM | POA: Diagnosis not present

## 2021-05-19 DIAGNOSIS — I2510 Atherosclerotic heart disease of native coronary artery without angina pectoris: Secondary | ICD-10-CM | POA: Diagnosis not present

## 2021-05-19 DIAGNOSIS — I350 Nonrheumatic aortic (valve) stenosis: Secondary | ICD-10-CM | POA: Diagnosis not present

## 2021-05-19 MED ORDER — POTASSIUM CHLORIDE 20 MEQ/15ML (10%) PO SOLN: 20.0000 meq | Freq: Every day | ORAL | 3 refills | Status: DC

## 2021-05-19 MED ORDER — TORSEMIDE 10 MG PO TABS: 30.0000 mg | ORAL_TABLET | Freq: Every day | ORAL | 6 refills | Status: DC

## 2021-05-19 NOTE — Patient Instructions (Signed)
Medication Instructions:  Your physician recommends that you continue on your current medications as directed. Please refer to the Current Medication list given to you today.  *If you need a refill on your cardiac medications before your next appointment, please call your pharmacy*   Lab Work: BMET in 2 weeks If you have labs (blood work) drawn today and your tests are completely normal, you will receive your results only by: [place] (if you have MyChart) OR A paper copy in the mail If you have any lab test that is abnormal or we need to change your treatment, we will call you to review the results.   Follow-Up: At [facility], you and your health needs are our priority.  As part of our continuing mission to provide you with exceptional heart care, we have created designated Provider Care Teams.  These Care Teams include your primary Cardiologist (physician) and Advanced Practice Providers (APPs -  Physician Assistants and Nurse Practitioners) who all work together to provide you with the care you need, when you need it.   Your next appointment:   As scheduled

## 2021-05-21 ENCOUNTER — Other Ambulatory Visit: Payer: Self-pay | Admitting: Internal Medicine

## 2021-05-28 ENCOUNTER — Telehealth: Payer: Self-pay | Admitting: Adult Health

## 2021-05-28 NOTE — Telephone Encounter (Signed)
Okay for verbal orders? Please advise 

## 2021-05-28 NOTE — Telephone Encounter (Signed)
Kelly from [facility] called wanting to extend physical therapy on behalf of the patient for one week four.  Contact number for Claiborne Billings is [phone].  She says that she is okay to receive verbal orders.  Please advise.

## 2021-05-29 ENCOUNTER — Other Ambulatory Visit: Payer: Self-pay | Admitting: Adult Health

## 2021-05-29 ENCOUNTER — Telehealth: Payer: Self-pay | Admitting: Adult Health

## 2021-05-29 MED ORDER — LOSARTAN POTASSIUM 25 MG PO TABS: 25.0000 mg | ORAL_TABLET | Freq: Every day | ORAL | 3 refills | Status: DC

## 2021-05-29 NOTE — Telephone Encounter (Signed)
Ok to keep Losartan at 25mg  instead of 100mg  due to cutting issues?

## 2021-05-29 NOTE — Telephone Encounter (Signed)
Noted! Tried to call pt to update no answer

## 2021-05-29 NOTE — Telephone Encounter (Signed)
Patient's son called on her behalf to ask for prescription change for losartan (COZAAR) 25 MG tablet from [place]. Patient went to hospital and was switched from 100mg  to 25mg . Patient and son want it permanently switched to 25mg  instead of getting the 100mg  and having to cut them. Son states he has a hard time cutting them. They would like to have 90 day supply of the 25mg    Please Send to  [id number] [place] [place] Phone:  [phone]  Fax:  [phone]         Good callback number [phone]   Please advise

## 2021-05-29 NOTE — Telephone Encounter (Signed)
Left message to return phone call.

## 2021-06-01 ENCOUNTER — Other Ambulatory Visit: Payer: Self-pay | Admitting: Internal Medicine

## 2021-06-03 NOTE — Telephone Encounter (Signed)
Patient son Randal (DPR) notified of update  and verbalized understanding.

## 2021-06-06 ENCOUNTER — Emergency Department (HOSPITAL_BASED_OUTPATIENT_CLINIC_OR_DEPARTMENT_OTHER): Payer: Medicare Other

## 2021-06-06 ENCOUNTER — Inpatient Hospital Stay (HOSPITAL_BASED_OUTPATIENT_CLINIC_OR_DEPARTMENT_OTHER)
Admission: EM | Admit: 2021-06-06 | Discharge: 2021-06-11 | DRG: 291 | Disposition: A | Discharge status: Home Health | Payer: Medicare Other | Attending: Internal Medicine | Admitting: Internal Medicine

## 2021-06-06 ENCOUNTER — Encounter (HOSPITAL_BASED_OUTPATIENT_CLINIC_OR_DEPARTMENT_OTHER): Payer: Self-pay

## 2021-06-06 ENCOUNTER — Other Ambulatory Visit: Payer: Self-pay

## 2021-06-06 DIAGNOSIS — Z888 Allergy status to other drugs, medicaments and biological substances status: Secondary | ICD-10-CM

## 2021-06-06 DIAGNOSIS — I4819 Other persistent atrial fibrillation: Secondary | ICD-10-CM | POA: Diagnosis present

## 2021-06-06 DIAGNOSIS — J449 Chronic obstructive pulmonary disease, unspecified: Secondary | ICD-10-CM | POA: Diagnosis present

## 2021-06-06 DIAGNOSIS — E7800 Pure hypercholesterolemia, unspecified: Secondary | ICD-10-CM | POA: Diagnosis present

## 2021-06-06 DIAGNOSIS — H548 Legal blindness, as defined in USA: Secondary | ICD-10-CM | POA: Diagnosis present

## 2021-06-06 DIAGNOSIS — Z87891 Personal history of nicotine dependence: Secondary | ICD-10-CM

## 2021-06-06 DIAGNOSIS — Z833 Family history of diabetes mellitus: Secondary | ICD-10-CM

## 2021-06-06 DIAGNOSIS — R531 Weakness: Secondary | ICD-10-CM | POA: Diagnosis present

## 2021-06-06 DIAGNOSIS — Z66 Do not resuscitate: Secondary | ICD-10-CM | POA: Diagnosis present

## 2021-06-06 DIAGNOSIS — E663 Overweight: Secondary | ICD-10-CM | POA: Diagnosis present

## 2021-06-06 DIAGNOSIS — Z7901 Long term (current) use of anticoagulants: Secondary | ICD-10-CM

## 2021-06-06 DIAGNOSIS — I110 Hypertensive heart disease with heart failure: Principal | ICD-10-CM | POA: Diagnosis present

## 2021-06-06 DIAGNOSIS — K219 Gastro-esophageal reflux disease without esophagitis: Secondary | ICD-10-CM | POA: Diagnosis present

## 2021-06-06 DIAGNOSIS — J9601 Acute respiratory failure with hypoxia: Secondary | ICD-10-CM | POA: Diagnosis present

## 2021-06-06 DIAGNOSIS — U071 COVID-19: Secondary | ICD-10-CM | POA: Diagnosis present

## 2021-06-06 DIAGNOSIS — Z79899 Other long term (current) drug therapy: Secondary | ICD-10-CM

## 2021-06-06 DIAGNOSIS — E119 Type 2 diabetes mellitus without complications: Secondary | ICD-10-CM | POA: Diagnosis present

## 2021-06-06 DIAGNOSIS — I2510 Atherosclerotic heart disease of native coronary artery without angina pectoris: Secondary | ICD-10-CM | POA: Diagnosis present

## 2021-06-06 DIAGNOSIS — Z885 Allergy status to narcotic agent status: Secondary | ICD-10-CM

## 2021-06-06 DIAGNOSIS — Z6828 Body mass index (BMI) 28.0-28.9, adult: Secondary | ICD-10-CM

## 2021-06-06 DIAGNOSIS — Z952 Presence of prosthetic heart valve: Secondary | ICD-10-CM

## 2021-06-06 DIAGNOSIS — I5023 Acute on chronic systolic (congestive) heart failure: Secondary | ICD-10-CM | POA: Diagnosis present

## 2021-06-06 NOTE — ED Triage Notes (Addendum)
Pt is present for cough and weakness with nausea since yesterday. Pt is seeking anti-virals due to COVID positive test today with an at home kit. Son brought her from [place]. 95% RA.

## 2021-06-07 ENCOUNTER — Encounter (HOSPITAL_BASED_OUTPATIENT_CLINIC_OR_DEPARTMENT_OTHER): Payer: Self-pay | Admitting: Emergency Medicine

## 2021-06-07 DIAGNOSIS — H548 Legal blindness, as defined in USA: Secondary | ICD-10-CM | POA: Diagnosis present

## 2021-06-07 DIAGNOSIS — E7800 Pure hypercholesterolemia, unspecified: Secondary | ICD-10-CM | POA: Diagnosis present

## 2021-06-07 DIAGNOSIS — Z6828 Body mass index (BMI) 28.0-28.9, adult: Secondary | ICD-10-CM | POA: Diagnosis not present

## 2021-06-07 DIAGNOSIS — I5023 Acute on chronic systolic (congestive) heart failure: Secondary | ICD-10-CM

## 2021-06-07 DIAGNOSIS — I2510 Atherosclerotic heart disease of native coronary artery without angina pectoris: Secondary | ICD-10-CM | POA: Diagnosis present

## 2021-06-07 DIAGNOSIS — Z79899 Other long term (current) drug therapy: Secondary | ICD-10-CM | POA: Diagnosis not present

## 2021-06-07 DIAGNOSIS — Z885 Allergy status to narcotic agent status: Secondary | ICD-10-CM | POA: Diagnosis not present

## 2021-06-07 DIAGNOSIS — I4819 Other persistent atrial fibrillation: Secondary | ICD-10-CM

## 2021-06-07 DIAGNOSIS — Z888 Allergy status to other drugs, medicaments and biological substances status: Secondary | ICD-10-CM | POA: Diagnosis not present

## 2021-06-07 DIAGNOSIS — Z833 Family history of diabetes mellitus: Secondary | ICD-10-CM | POA: Diagnosis not present

## 2021-06-07 DIAGNOSIS — J449 Chronic obstructive pulmonary disease, unspecified: Secondary | ICD-10-CM | POA: Diagnosis present

## 2021-06-07 DIAGNOSIS — J9601 Acute respiratory failure with hypoxia: Secondary | ICD-10-CM | POA: Diagnosis present

## 2021-06-07 DIAGNOSIS — I5022 Chronic systolic (congestive) heart failure: Secondary | ICD-10-CM | POA: Diagnosis not present

## 2021-06-07 DIAGNOSIS — Z952 Presence of prosthetic heart valve: Secondary | ICD-10-CM | POA: Diagnosis not present

## 2021-06-07 DIAGNOSIS — U071 COVID-19: Secondary | ICD-10-CM

## 2021-06-07 DIAGNOSIS — Z87891 Personal history of nicotine dependence: Secondary | ICD-10-CM | POA: Diagnosis not present

## 2021-06-07 DIAGNOSIS — R531 Weakness: Secondary | ICD-10-CM

## 2021-06-07 DIAGNOSIS — I110 Hypertensive heart disease with heart failure: Secondary | ICD-10-CM | POA: Diagnosis present

## 2021-06-07 DIAGNOSIS — K219 Gastro-esophageal reflux disease without esophagitis: Secondary | ICD-10-CM | POA: Diagnosis present

## 2021-06-07 DIAGNOSIS — E663 Overweight: Secondary | ICD-10-CM | POA: Diagnosis present

## 2021-06-07 DIAGNOSIS — E11638 Type 2 diabetes mellitus with other oral complications: Secondary | ICD-10-CM

## 2021-06-07 DIAGNOSIS — Z7901 Long term (current) use of anticoagulants: Secondary | ICD-10-CM | POA: Diagnosis not present

## 2021-06-07 DIAGNOSIS — E119 Type 2 diabetes mellitus without complications: Secondary | ICD-10-CM | POA: Diagnosis present

## 2021-06-07 DIAGNOSIS — Z66 Do not resuscitate: Secondary | ICD-10-CM | POA: Diagnosis present

## 2021-06-07 HISTORY — DX: COVID-19: U07.1

## 2021-06-07 LAB — MAGNESIUM: Magnesium: 2.1 mg/dL (ref 1.7–2.4)

## 2021-06-07 LAB — FERRITIN: Ferritin: 135 ng/mL (ref 11–307)

## 2021-06-07 LAB — COMPREHENSIVE METABOLIC PANEL
ALT: 20 U/L (ref 0–44)
ALT: 26 U/L (ref 0–44)
AST: 28 U/L (ref 15–41)
AST: 42 U/L — ABNORMAL HIGH (ref 15–41)
Albumin: 3.8 g/dL (ref 3.5–5.0)
Albumin: 3.9 g/dL (ref 3.5–5.0)
Alkaline Phosphatase: 42 U/L (ref 38–126)
Alkaline Phosphatase: 50 U/L (ref 38–126)
Anion gap: 10 (ref 5–15)
Anion gap: 11 (ref 5–15)
BUN: 36 mg/dL — ABNORMAL HIGH (ref 8–23)
BUN: 40 mg/dL — ABNORMAL HIGH (ref 8–23)
CO2: 25 mmol/L (ref 22–32)
CO2: 25 mmol/L (ref 22–32)
Calcium: 8.9 mg/dL (ref 8.9–10.3)
Calcium: 9 mg/dL (ref 8.9–10.3)
Chloride: 100 mmol/L (ref 98–111)
Chloride: 99 mmol/L (ref 98–111)
Creatinine, Ser: 1 mg/dL (ref 0.44–1.00)
Creatinine, Ser: 1.01 mg/dL — ABNORMAL HIGH (ref 0.44–1.00)
GFR, Estimated: 53 mL/min — ABNORMAL LOW (ref >60)
GFR, Estimated: 53 mL/min — ABNORMAL LOW (ref >60)
Glucose, Bld: 131 mg/dL — ABNORMAL HIGH (ref 70–99)
Glucose, Bld: 189 mg/dL — ABNORMAL HIGH (ref 70–99)
Potassium: 3.8 mmol/L (ref 3.5–5.1)
Potassium: 4 mmol/L (ref 3.5–5.1)
Sodium: 134 mmol/L — ABNORMAL LOW (ref 135–145)
Sodium: 136 mmol/L (ref 135–145)
Total Bilirubin: 0.9 mg/dL (ref 0.3–1.2)
Total Bilirubin: 1.2 mg/dL (ref 0.3–1.2)
Total Protein: 6.3 g/dL — ABNORMAL LOW (ref 6.5–8.1)
Total Protein: 7 g/dL (ref 6.5–8.1)

## 2021-06-07 LAB — GLUCOSE, CAPILLARY
Glucose-Capillary: 149 mg/dL — ABNORMAL HIGH (ref 70–99)
Glucose-Capillary: 142 mg/dL — ABNORMAL HIGH (ref 70–99)
Glucose-Capillary: 207 mg/dL — ABNORMAL HIGH (ref 70–99)
Glucose-Capillary: 154 mg/dL — ABNORMAL HIGH (ref 70–99)

## 2021-06-07 LAB — CBC WITH DIFFERENTIAL/PLATELET
Abs Immature Granulocytes: 0.01 10*3/uL (ref 0.00–0.07)
Abs Immature Granulocytes: 0.01 10*3/uL (ref 0.00–0.07)
Basophils Absolute: 0 10*3/uL (ref 0.0–0.1)
Basophils Absolute: 0 10*3/uL (ref 0.0–0.1)
Basophils Relative: 0 %
Basophils Relative: 0 %
Eosinophils Absolute: 0 10*3/uL (ref 0.0–0.5)
Eosinophils Absolute: 0 10*3/uL (ref 0.0–0.5)
Eosinophils Relative: 0 %
Eosinophils Relative: 0 %
HCT: 32.7 % — ABNORMAL LOW (ref 36.0–46.0)
HCT: 36.9 % (ref 36.0–46.0)
Hemoglobin: 10.4 g/dL — ABNORMAL LOW (ref 12.0–15.0)
Hemoglobin: 11.6 g/dL — ABNORMAL LOW (ref 12.0–15.0)
Immature Granulocytes: 0 %
Immature Granulocytes: 0 %
Lymphocytes Relative: 6 %
Lymphocytes Relative: 8 %
Lymphs Abs: 0.3 10*3/uL — ABNORMAL LOW (ref 0.7–4.0)
Lymphs Abs: 0.4 10*3/uL — ABNORMAL LOW (ref 0.7–4.0)
MCH: 28.8 pg (ref 26.0–34.0)
MCH: 29.4 pg (ref 26.0–34.0)
MCHC: 31.4 g/dL (ref 30.0–36.0)
MCHC: 31.8 g/dL (ref 30.0–36.0)
MCV: 90.6 fL (ref 80.0–100.0)
MCV: 93.4 fL (ref 80.0–100.0)
Monocytes Absolute: 0.2 10*3/uL (ref 0.1–1.0)
Monocytes Absolute: 0.5 10*3/uL (ref 0.1–1.0)
Monocytes Relative: 10 %
Monocytes Relative: 4 %
Neutro Abs: 4.2 10*3/uL (ref 1.7–7.7)
Neutro Abs: 4.7 10*3/uL (ref 1.7–7.7)
Neutrophils Relative %: 82 %
Neutrophils Relative %: 90 %
Platelets: 182 10*3/uL (ref 150–400)
Platelets: 191 10*3/uL (ref 150–400)
RBC: 3.61 MIL/uL — ABNORMAL LOW (ref 3.87–5.11)
RBC: 3.95 MIL/uL (ref 3.87–5.11)
RDW: 15.1 % (ref 11.5–15.5)
RDW: 15.2 % (ref 11.5–15.5)
WBC: 5.1 10*3/uL (ref 4.0–10.5)
WBC: 5.3 10*3/uL (ref 4.0–10.5)
nRBC: 0 % (ref 0.0–0.2)
nRBC: 0 % (ref 0.0–0.2)

## 2021-06-07 LAB — BLOOD GAS, VENOUS
Acid-Base Excess: 1 mmol/L (ref 0.0–2.0)
Bicarbonate: 25.5 mmol/L (ref 20.0–28.0)
O2 Saturation: 22.7 %
Patient temperature: 98.6
pCO2, Ven: 42.8 mmHg — ABNORMAL LOW (ref 44.0–60.0)
pH, Ven: 7.393 (ref 7.250–7.430)
pO2, Ven: 20.4 mmHg — CL (ref 32.0–45.0)

## 2021-06-07 LAB — TSH: TSH: 0.692 u[IU]/mL (ref 0.350–4.500)

## 2021-06-07 LAB — RESP PANEL BY RT-PCR (FLU A&B, COVID) ARPGX2
Influenza A by PCR: NEGATIVE
Influenza B by PCR: NEGATIVE
SARS Coronavirus 2 by RT PCR: POSITIVE — AB

## 2021-06-07 LAB — FIBRINOGEN: Fibrinogen: 496 mg/dL — ABNORMAL HIGH (ref 210–475)

## 2021-06-07 LAB — PHOSPHORUS: Phosphorus: 4.1 mg/dL (ref 2.5–4.6)

## 2021-06-07 LAB — LACTATE DEHYDROGENASE: LDH: 249 U/L — ABNORMAL HIGH (ref 98–192)

## 2021-06-07 LAB — C-REACTIVE PROTEIN: CRP: 4.8 mg/dL — ABNORMAL HIGH (ref <1.0)

## 2021-06-07 LAB — D-DIMER, QUANTITATIVE: D-Dimer, Quant: 0.7 ug/mL-FEU — ABNORMAL HIGH (ref 0.00–0.50)

## 2021-06-07 LAB — PROCALCITONIN: Procalcitonin: 0.13 ng/mL

## 2021-06-07 LAB — BRAIN NATRIURETIC PEPTIDE: B Natriuretic Peptide: 3284.2 pg/mL — ABNORMAL HIGH (ref 0.0–100.0)

## 2021-06-07 MED ORDER — SODIUM CHLORIDE 0.9 % IV SOLN
100.0000 mg | Freq: Every day | INTRAVENOUS | Status: AC
  Administered 2021-06-08 – 2021-06-11 (×4): 100 mg via INTRAVENOUS
  Filled 2021-06-07 (×4): qty 20

## 2021-06-07 MED ORDER — ACETAMINOPHEN 650 MG RE SUPP: 650.0000 mg | Freq: Four times a day (QID) | RECTAL | Status: DC | PRN

## 2021-06-07 MED ORDER — FUROSEMIDE 10 MG/ML IJ SOLN
40.0000 mg | Freq: Two times a day (BID) | INTRAMUSCULAR | Status: DC
  Administered 2021-06-07 – 2021-06-11 (×9): 40 mg via INTRAVENOUS
  Filled 2021-06-07 (×9): qty 4

## 2021-06-07 MED ORDER — POTASSIUM CHLORIDE 20 MEQ PO PACK
20.0000 meq | PACK | Freq: Every day | ORAL | Status: DC
  Administered 2021-06-07 – 2021-06-11 (×5): 20 meq via ORAL
  Filled 2021-06-07 (×5): qty 1

## 2021-06-07 MED ORDER — METHYLPREDNISOLONE SODIUM SUCC 40 MG IJ SOLR
30.0000 mg | Freq: Two times a day (BID) | INTRAMUSCULAR | Status: AC
  Administered 2021-06-07 – 2021-06-09 (×6): 30 mg via INTRAVENOUS
  Filled 2021-06-07 (×6): qty 1

## 2021-06-07 MED ORDER — IPRATROPIUM-ALBUTEROL 20-100 MCG/ACT IN AERS
1.0000 | INHALATION_SPRAY | Freq: Four times a day (QID) | RESPIRATORY_TRACT | Status: DC
  Administered 2021-06-07 – 2021-06-08 (×4): 1 via RESPIRATORY_TRACT
  Filled 2021-06-07: qty 4

## 2021-06-07 MED ORDER — PREDNISONE 5 MG PO TABS
50.0000 mg | ORAL_TABLET | Freq: Every day | ORAL | Status: DC
  Administered 2021-06-10 – 2021-06-11 (×2): 50 mg via ORAL
  Filled 2021-06-07 (×2): qty 2

## 2021-06-07 MED ORDER — APIXABAN 5 MG PO TABS
5.0000 mg | ORAL_TABLET | Freq: Two times a day (BID) | ORAL | Status: DC
  Administered 2021-06-07 – 2021-06-11 (×9): 5 mg via ORAL
  Filled 2021-06-07 (×9): qty 1

## 2021-06-07 MED ORDER — DEXAMETHASONE SODIUM PHOSPHATE 10 MG/ML IJ SOLN
6.0000 mg | Freq: Once | INTRAMUSCULAR | Status: AC
  Administered 2021-06-07: 6 mg via INTRAVENOUS
  Filled 2021-06-07: qty 1

## 2021-06-07 MED ORDER — LOSARTAN POTASSIUM 25 MG PO TABS
25.0000 mg | ORAL_TABLET | Freq: Every day | ORAL | Status: DC
  Administered 2021-06-07 – 2021-06-11 (×5): 25 mg via ORAL
  Filled 2021-06-07 (×5): qty 1

## 2021-06-07 MED ORDER — ATORVASTATIN CALCIUM 40 MG PO TABS
40.0000 mg | ORAL_TABLET | Freq: Every day | ORAL | Status: DC
  Administered 2021-06-07 – 2021-06-11 (×5): 40 mg via ORAL
  Filled 2021-06-07 (×5): qty 1

## 2021-06-07 MED ORDER — LINAGLIPTIN 5 MG PO TABS
5.0000 mg | ORAL_TABLET | Freq: Every day | ORAL | Status: DC
  Administered 2021-06-07 – 2021-06-11 (×5): 5 mg via ORAL
  Filled 2021-06-07 (×5): qty 1

## 2021-06-07 MED ORDER — INSULIN ASPART 100 UNIT/ML IJ SOLN
0.0000 [IU] | Freq: Three times a day (TID) | INTRAMUSCULAR | Status: DC
  Administered 2021-06-07: 2 [IU] via SUBCUTANEOUS
  Administered 2021-06-08 – 2021-06-11 (×8): 1 [IU] via SUBCUTANEOUS

## 2021-06-07 MED ORDER — SODIUM CHLORIDE 0.9 % IV SOLN
100.0000 mg | INTRAVENOUS | Status: AC
  Administered 2021-06-07: 100 mg via INTRAVENOUS

## 2021-06-07 MED ORDER — ALBUTEROL SULFATE HFA 108 (90 BASE) MCG/ACT IN AERS
1.0000 | INHALATION_SPRAY | RESPIRATORY_TRACT | Status: DC | PRN
  Filled 2021-06-07: qty 6.7

## 2021-06-07 MED ORDER — ASPIRIN 81 MG PO CHEW
81.0000 mg | CHEWABLE_TABLET | Freq: Every day | ORAL | Status: DC
  Administered 2021-06-07 – 2021-06-11 (×5): 81 mg via ORAL
  Filled 2021-06-07 (×5): qty 1

## 2021-06-07 MED ORDER — ACETAMINOPHEN 325 MG PO TABS: 650.0000 mg | ORAL_TABLET | Freq: Four times a day (QID) | ORAL | Status: DC | PRN

## 2021-06-07 MED ORDER — TORSEMIDE 10 MG PO TABS
30.0000 mg | ORAL_TABLET | Freq: Every day | ORAL | Status: DC
  Administered 2021-06-07 – 2021-06-08 (×2): 30 mg via ORAL
  Filled 2021-06-07 (×3): qty 3

## 2021-06-07 MED ORDER — METOPROLOL SUCCINATE ER 50 MG PO TB24
100.0000 mg | ORAL_TABLET | Freq: Every day | ORAL | Status: DC
  Administered 2021-06-07 – 2021-06-11 (×5): 100 mg via ORAL
  Filled 2021-06-07 (×5): qty 2

## 2021-06-07 MED ORDER — PANTOPRAZOLE SODIUM 40 MG PO TBEC
40.0000 mg | DELAYED_RELEASE_TABLET | Freq: Every day | ORAL | Status: DC
  Administered 2021-06-07 – 2021-06-11 (×5): 40 mg via ORAL
  Filled 2021-06-07 (×5): qty 1

## 2021-06-07 NOTE — ED Notes (Signed)
[facility] has arrived for patient 

## 2021-06-07 NOTE — ED Provider Notes (Addendum)
[facility] Provider Note   CSN: [id number] Arrival date & time: 06/06/21  2018     History Chief Complaint  Patient presents with   Cough   Weakness   Nausea    [facility] is a 87 y.o. female.  The history is provided by a relative. The history is limited by the condition of the patient.  Cough Cough characteristics:  Non-productive Severity:  Moderate Onset quality:  Gradual Duration:  2 days Timing:  Intermittent Progression:  Unchanged Chronicity:  New Smoker: no   Context: upper respiratory infection   Relieved by:  Nothing Worsened by:  Nothing Ineffective treatments:  None tried Associated symptoms: chills   Associated symptoms: no fever, no sore throat and no wheezing   Associated symptoms comment:  Global weakness  Risk factors: no chemical exposure   Weakness Severity:  Moderate Onset quality:  Gradual Duration:  2 days Timing:  Constant Progression:  Worsening Chronicity:  New Context: not change in medication and not decreased sleep   Relieved by:  Nothing Worsened by:  Nothing Ineffective treatments:  None tried Associated symptoms: cough and nausea   Associated symptoms: no aphasia, no arthralgias, no dizziness, no drooling, no dysphagia, no fever, no loss of consciousness and no vomiting   Risk factors: no new medications       Past Medical History:  Diagnosis Date   Aortic stenosis    s/p TAVR 26 mm Edwards Sapien 3 THV July 2020   CAD (coronary artery disease)    COPD (chronic obstructive pulmonary disease) (HCC)    Diabetes mellitus (HCC)    Diverticulitis    GERD (gastroesophageal reflux disease)    HTN (hypertension)    Hyperlipidemia    Legally blind in left eye, as defined in [place]    Tobacco abuse     Patient Active Problem List   Diagnosis Date Noted   CHF (congestive heart failure) ([place]) 05/05/2021   GI bleed 04/13/2021   Anemia 04/13/2021   Symptomatic anemia 04/13/2021   Prolonged  QT interval 04/13/2021   CKD (chronic kidney disease), stage III ([place]) 04/13/2021   Acute on chronic systolic CHF (congestive heart failure) (HCC) 01/29/2021   Cognitive deficits    Pressure injury of skin 12/19/2020   Acute exacerbation of CHF (congestive heart failure) ([place]) 12/18/2020   COPD (chronic obstructive pulmonary disease) ([place]) 09/02/2020   Dyslipidemia 09/02/2020   Acute respiratory failure with hypoxia (HCC) 09/01/2020   Pulmonary nodule 1 cm or greater in diameter 01/03/2020   S/P TAVR (transcatheter aortic valve replacement) 05/03/2019   Primary osteoarthritis involving multiple joints 05/03/2019   Other persistent atrial fibrillation ([place]) 05/03/2019   Bilateral carotid artery stenosis 12/01/2017   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/13/2014   Aortic valve disorder 05/03/2014   Chronic obstructive bronchitis without exacerbation ([place]) 05/03/2014   Derangement of left knee 05/03/2014   Esophageal reflux 05/03/2014   Essential hypertension 05/03/2014   Pure hypercholesterolemia 05/03/2014   Type II diabetes mellitus ([place]) 05/03/2014   Vitamin D deficiency 05/03/2014    Past Surgical History:  Procedure Laterality Date   ABDOMINAL HYSTERECTOMY     APPENDECTOMY     BACK SURGERY     CATARACT EXTRACTION, BILATERAL     CHOLECYSTECTOMY     COLONOSCOPY WITH PROPOFOL N/A 04/16/2021   Procedure: COLONOSCOPY WITH PROPOFOL;  Surgeon: Milus Banister, MD;  Location: [facility];  Service: Gastroenterology;  Laterality: N/A;   ESOPHAGOGASTRODUODENOSCOPY (EGD) WITH PROPOFOL N/A 04/15/2021  Procedure: ESOPHAGOGASTRODUODENOSCOPY (EGD) WITH PROPOFOL;  Surgeon: Doran Stabler, MD;  Location: [facility];  Service: Gastroenterology;  Laterality: N/A;     OB History     Gravida  2   Para  2   Term      Preterm      AB      Living         SAB      IAB      Ectopic      Multiple      Live Births              Family History  Problem Relation Age of  Onset   Diabetes Mother    Stroke Brother    Cerebral palsy Brother    Coronary artery disease Neg Hx     Social History   Tobacco Use   Smoking status: Former    Packs/day: 1.00    Years: 70.00    Pack years: 70.00    Types: Cigarettes    Quit date: 12/15/2019    Years since quitting: 1.4   Smokeless tobacco: Never   Tobacco comments:    Pt states quiting a year ago. 01/03/20 [facility]  Vaping Use   Vaping Use: Never used  Substance Use Topics   Alcohol use: No    Alcohol/week: 0.0 standard drinks   Drug use: No    Home Medications Prior to Admission medications   Medication Sig Start Date End Date Taking? Authorizing Provider  acetaminophen (TYLENOL) 500 MG tablet Take 500 mg by mouth 3 (three) times daily as needed for headache (pain). Take with tramadol    [provider]  atorvastatin (LIPITOR) 40 MG tablet TAKE 1 TABLET BY MOUTH DAILY AT 6 PM. 03/10/21   Nafziger, Tommi Rumps, NP  cholecalciferol (VITAMIN D3) 25 MCG (1000 UNIT) tablet Take 1,000 Units by mouth 2 (two) times daily.    [provider]  Coenzyme Q10 (CO Q-10) 300 MG CAPS Take 300 mg by mouth in the morning.    [provider]  [facility] ASPIRIN ADULT LOW DOSE 81 MG chewable tablet CHEW 1 TABLET (81 MG TOTAL) BY MOUTH DAILY. 03/10/21   Nafziger, Tommi Rumps, NP  ELIQUIS 5 MG TABS tablet TAKE 1 TABLET BY MOUTH TWICE A DAY 03/10/21   Nafziger, Tommi Rumps, NP  ferrous gluconate (FERGON) 324 MG tablet TAKE 1 TABLET BY MOUTH DAILY WITH BREAKFAST 03/10/21   Nafziger, Tommi Rumps, NP  HM LIDOCAINE PATCH EX Apply 1 patch topically daily as needed (for back pain).    [provider]  Lidocaine-Menthol, Spray, 4-1 % LIQD Apply 1 spray topically daily as needed (back/knee pain/restless legs).    [provider]  losartan (COZAAR) 25 MG tablet Take 1 tablet (25 mg total) by mouth daily. 05/29/21   Nafziger, Tommi Rumps, NP  Magnesium 200 MG TABS Take 200 mg by mouth every morning.    [provider]  metoprolol  succinate (TOPROL-XL) 100 MG 24 hr tablet Take 1 tablet (100 mg total) by mouth daily. Take with or immediately following a meal. 05/09/21 06/08/21  Bonnell Public, MD  Multiple Vitamin (MULTIVITAMIN WITH MINERALS) TABS tablet Take 1 tablet by mouth 2 (two) times daily. Centrum Minis    [provider]  pantoprazole (PROTONIX) 40 MG tablet Take 1 tablet (40 mg total) by mouth daily. 05/08/21 05/08/22  Dana Allan I, MD  potassium chloride 20 MEQ/15ML (10%) SOLN Take 15 mLs (20 mEq total) by mouth daily. 05/19/21  08/17/21  [name], PA-C  senna-docusate (SENOKOT-S) 8.6-50 MG tablet Take 1 tablet by mouth 2 (two) times daily between meals as needed for mild constipation. 05/07/21   Mercy Riding, MD  sodium chloride (OCEAN) 0.65 % SOLN nasal spray Place 1 spray into both nostrils daily as needed for congestion.    [provider]  terazosin (HYTRIN) 10 MG capsule Take 1 capsule (10 mg total) by mouth daily at 12 noon. 12/16/20   Nafziger, Tommi Rumps, NP  torsemide (DEMADEX) 10 MG tablet Take 3 tablets (30 mg total) by mouth daily. 05/19/21   [name], PA-C  traMADol (ULTRAM) 50 MG tablet Take 50 mg by mouth every 6 (six) hours as needed for severe pain.    [provider]  vitamin B-12 (CYANOCOBALAMIN) 1000 MCG tablet Take 1 tablet (1,000 mcg total) by mouth daily. 04/16/21   Oswald Hillock, MD    Allergies    Amlodipine besy-benazepril hcl, Hctz [hydrochlorothiazide], Other, Cyclobenzaprine, Diclofenac sodium, Hydralazine, Methylprednisolone, and Oxycodone  Review of Systems   Review of Systems  Unable to perform ROS: Other  Constitutional:  Positive for chills. Negative for fever.  HENT:  Negative for drooling and sore throat.   Respiratory:  Positive for cough. Negative for wheezing.   Gastrointestinal:  Positive for nausea. Negative for dysphagia and vomiting.  Musculoskeletal:  Negative for arthralgias.  Neurological:  Positive for weakness. Negative for  dizziness and loss of consciousness.   Physical Exam Updated Vital Signs BP (!) 113/59   Pulse 88   Temp 98.5 F (36.9 C) (Oral)   Resp 19   Ht 5' (1.524 m)   Wt 65.3 kg   SpO2 92%   BMI 28.12 kg/m   Physical Exam Vitals and nursing note reviewed.  Constitutional:      General: She is not in acute distress.    Appearance: Normal appearance.  HENT:     Head: Normocephalic and atraumatic.     Nose: Nose normal.  Eyes:     Conjunctiva/sclera: Conjunctivae normal.     Pupils: Pupils are equal, round, and reactive to light.  Cardiovascular:     Rate and Rhythm: Normal rate and regular rhythm.     Pulses: Normal pulses.     Heart sounds: Normal heart sounds.  Pulmonary:     Effort: Pulmonary effort is normal.     Breath sounds: Normal breath sounds.  Abdominal:     General: Abdomen is flat. Bowel sounds are normal.     Palpations: Abdomen is soft.     Tenderness: There is no abdominal tenderness. There is no guarding.  Musculoskeletal:        General: Normal range of motion.     Cervical back: Normal range of motion and neck supple.  Skin:    General: Skin is warm and dry.     Capillary Refill: Capillary refill takes less than 2 seconds.  Neurological:     Mental Status: She is alert.     Deep Tendon Reflexes: Reflexes normal.  Psychiatric:        Mood and Affect: Mood normal.        Behavior: Behavior normal.    ED Results / Procedures / Treatments   Labs (all labs ordered are listed, but only abnormal results are displayed) Results for orders placed or performed during the hospital encounter of 06/06/21  CBC with Differential/Platelet  Result Value Ref Range   WBC 5.1 4.0 - 10.5 K/uL  RBC 3.61 (L) 3.87 - 5.11 MIL/uL   Hemoglobin 10.4 (L) 12.0 - 15.0 g/dL   HCT 32.7 (L) 36.0 - 46.0 %   MCV 90.6 80.0 - 100.0 fL   MCH 28.8 26.0 - 34.0 pg   MCHC 31.8 30.0 - 36.0 g/dL   RDW 15.2 11.5 - 15.5 %   Platelets 191 150 - 400 K/uL   nRBC 0.0 0.0 - 0.2 %    Neutrophils Relative % 82 %   Neutro Abs 4.2 1.7 - 7.7 K/uL   Lymphocytes Relative 8 %   Lymphs Abs 0.4 (L) 0.7 - 4.0 K/uL   Monocytes Relative 10 %   Monocytes Absolute 0.5 0.1 - 1.0 K/uL   Eosinophils Relative 0 %   Eosinophils Absolute 0.0 0.0 - 0.5 K/uL   Basophils Relative 0 %   Basophils Absolute 0.0 0.0 - 0.1 K/uL   Immature Granulocytes 0 %   Abs Immature Granulocytes 0.01 0.00 - 0.07 K/uL  Comprehensive metabolic panel  Result Value Ref Range   Sodium 134 (L) 135 - 145 mmol/L   Potassium 3.8 3.5 - 5.1 mmol/L   Chloride 99 98 - 111 mmol/L   CO2 25 22 - 32 mmol/L   Glucose, Bld 131 (H) 70 - 99 mg/dL   BUN 36 (H) 8 - 23 mg/dL   Creatinine, Ser 1.01 (H) 0.44 - 1.00 mg/dL   Calcium 9.0 8.9 - 10.3 mg/dL   Total Protein 6.3 (L) 6.5 - 8.1 g/dL   Albumin 3.8 3.5 - 5.0 g/dL   AST 28 15 - 41 U/L   ALT 20 0 - 44 U/L   Alkaline Phosphatase 42 38 - 126 U/L   Total Bilirubin 0.9 0.3 - 1.2 mg/dL   GFR, Estimated 53 (L) >60 mL/min   Anion gap 10 5 - 15  D-dimer, quantitative  Result Value Ref Range   D-Dimer, Quant 0.70 (H) 0.00 - 0.50 ug/mL-FEU   DG Chest Portable 1 View  Result Date: 06/06/2021 CLINICAL DATA:  COVID. EXAM: PORTABLE CHEST 1 VIEW COMPARISON:  May 05, 2021 FINDINGS: Diffuse, chronic appearing increased interstitial lung markings are seen. Mild, stable left suprahilar atelectasis and/or infiltrate is noted. There is no evidence of a pleural effusion or pneumothorax. The cardiac silhouette is moderately enlarged. An artificial aortic valve is seen. There is marked severity calcification of the aortic arch. Degenerative changes are seen throughout the thoracic spine. IMPRESSION: Chronic appearing increased interstitial lung markings with mild, stable left suprahilar atelectasis and/or infiltrate. Electronically Signed   By: Virgina Norfolk M.D.   On: 06/06/2021 23:58     Radiology No results found.  Procedures Procedures   Medications Ordered in  ED Medications - No data to display  ED Course  I have reviewed the triage vital signs and the nursing notes.  Pertinent labs & imaging results that were available during my care of the patient were reviewed by me and considered in my medical decision making (see chart for details).   Hypoxia to the 80s in the room will admit to medicine.     [facility] was evaluated in Emergency Department on 06/07/2021 for the symptoms described in the history of present illness. She was evaluated in the context of the global COVID-19 pandemic, which necessitated consideration that the patient might be at risk for infection with the SARS-CoV-2 virus that causes COVID-19. Institutional protocols and algorithms that pertain to the evaluation of patients at risk for COVID-19 are in a state of rapid  change based on information released by regulatory bodies including the CDC and federal and state organizations. These policies and algorithms were followed during the patient's care in the ED.  Final Clinical Impression(s) / ED Diagnoses Final diagnoses:  [id number]    Rx / DC Orders ED Discharge Orders     None        Cohen Doleman, MD 06/07/21 Elton Sin, Adalena Abdulla, MD 06/07/21 0151

## 2021-06-07 NOTE — ED Notes (Signed)
Purewik placed on patient

## 2021-06-07 NOTE — H&P (Signed)
History and Physical    PLEASE NOTE THAT DRAGON DI[employer] SOFTWARE WAS USED IN THE CONSTRUCTION OF THIS NOTE.   Tara Fields NWG:[id number] DOB: 09-Mar-1928 DOA: 06/06/2021  PCP: [name], NP Patient coming from: home   I have personally briefly reviewed patient's old medical records in [place]  Chief Complaint: Cough  HPI: Tara Fields is a 87 y.o. female with medical history significant for systolic heart failure, COPD, aortic stenosis status post TAVR in 2020, type 2 diabetes mellitus, hypertension, hyperlipidemia, persistent atrial fibrillation chronically anticoagulated on Eliquis who is admitted to [facility] on 06/06/2021 by way of transfer from [facility] emergency department with severe COVID-19 infection after presenting from home to the latter facility complaining of cough.   The patient reports 2 days of progressive new onset nonproductive cough associated with subjective fever, chills, and generalized myalgias.  Denies any associated full body rigors.  Not associate with any headache, neck stiffness, sore throat, abdominal pain, diarrhea, or rash.  She also denies any recent dysuria, gross hematuria, or change in urinary urgency/frequency.  Not associate with any chest pain, diaphoresis, palpitations, nausea, vomiting, orthopnea, PND, or worsening of peripheral edema.  Also denies any recent wheezing or hemoptysis.  Denies any recent new onset calf tenderness, new lower extremity erythema, and reports good compliance chronic anticoagulation via Eliquis in the setting of a history of persistent atrial fibrillation.  Denies any baseline supplemental oxygen requirements.  She also reports associated generalized weakness over the last 2 days, in the absence of any acute focal weakness, acute focal numbness, paresthesias, facial droop, slurred speech, expressive aphasia, acute change in vision, dysphagia, vertigo.  She confirms a history of underlying  COPD in the setting of a long prior smoking history, which she acknowledges having previously smoked approximately 1 pack/day greater than 60 years, before completely quitting smoking in 2021, without subsequent resumption.  Medical history also notable for chronic systolic heart failure, with most recent echocardiogram performed in May 2022 notable for LVEF 25 to 30%, global hypokinesis of the left ventricle, left ventricular internal cavity mildly dilated, unable to assess diastolic parameters, severely dilated left atrium, and mild mitral regurgitation.  Patient conveys that she took a home COVID test on 06/06/2021 which was found to be positive.  In the setting of this positive test finding at progression of the above symptoms presented to [facility] emergency department for evaluation management of the above.    [facility] ED Course:  Vital signs in the ED were notable for the following: Temperature max 98.5; heart rate 82-92; blood pressure 113/59 -135/74; respiratory rate 16-21 initial oxygen saturation reportedly noted to be 88 to 89% on room air, which is subsequently improved to 96 to 90% on 2 L nasal cannula.  Labs were notable for the following: CMP notable for the following: Sodium 134, bicarbonate 25, creatinine 1.01, glucose 131, liver enzymes were found to be within normal limits, including AST 20, ALT 20 procalcitonin has been ordered, with result currently pending.  CBC notable for white blood cell count 5100, hemoglobin 10.4 compared to most recent prior value of 10.8 on 05/13/2021.  Nasopharyngeal COVID-19 PCR performed at [facility] ED this evening facility positive, however the patient's home test in the day.  Imaging and additional notable ED work-up: Chest x-ray, in comparison to chest x-ray from 05/05/2021 showed stable appearing left suprahilar airspace opacity suggestive of atelectasis versus infiltrate, without evidence of pulmonary edema, effusion, or pneumothorax.  Presenting  plain films also showed increased interstitial  lung markings that appear chronic and unchanged relative to the chest x-ray on 05/05/2021.  While in the ED, the following were administered: Decadron 6 mg IV x1, and remdesivir persistently, the patient was transferred to [facility] for further evaluation management of severe COVID-19 infection in the setting of acute hypoxic respiratory distress.     Review of Systems: As per HPI otherwise 10 point review of systems negative.   Past Medical History:  Diagnosis Date   Aortic stenosis    s/p TAVR 26 mm Edwards Sapien 3 THV July 2020   CAD (coronary artery disease)    COPD (chronic obstructive pulmonary disease) (HCC)    Diabetes mellitus (HCC)    Diverticulitis    GERD (gastroesophageal reflux disease)    HTN (hypertension)    Hyperlipidemia    Legally blind in left eye, as defined in [place]    Tobacco abuse     Past Surgical History:  Procedure Laterality Date   ABDOMINAL HYSTERECTOMY     APPENDECTOMY     BACK SURGERY     CATARACT EXTRACTION, BILATERAL     CHOLECYSTECTOMY     COLONOSCOPY WITH PROPOFOL N/A 04/16/2021   Procedure: COLONOSCOPY WITH PROPOFOL;  Surgeon: Milus Banister, MD;  Location: [facility];  Service: Gastroenterology;  Laterality: N/A;   ESOPHAGOGASTRODUODENOSCOPY (EGD) WITH PROPOFOL N/A 04/15/2021   Procedure: ESOPHAGOGASTRODUODENOSCOPY (EGD) WITH PROPOFOL;  Surgeon: Doran Stabler, MD;  Location: [facility];  Service: Gastroenterology;  Laterality: N/A;    Social History:  reports that she quit smoking about 17 months ago. Her smoking use included cigarettes. She has a 70.00 pack-year smoking history. She has never used smokeless tobacco. She reports that she does not drink alcohol and does not use drugs.   Allergies  Allergen Reactions   Amlodipine Besy-Benazepril Hcl Cough   Hctz [Hydrochlorothiazide] Other (See Comments)    Per family, every diuretic the patient has been on flushes out her out and  ultimately results in CRAMPING/JAUNDICE (added potassium might help)   Other Other (See Comments)    History of diverticulitis- CANNOT TOLERATE NUTS, CORN, AND ANY FOODS NOT EASILY DIGESTED- the patient AVOIDS these   Cyclobenzaprine Other (See Comments)    Confusion- does not tolerate well    Diclofenac Sodium Diarrhea   Hydralazine Other (See Comments)    Per family, every diuretic the patient has been on flushes out her out and ultimately results in CRAMPING/JAUNDICE (added potassium might help)   Methylprednisolone Other (See Comments)    Medrol/CORTICOSTEROIDS = A LOT OF ENERGY   Oxycodone Other (See Comments)    Caused patient to "act crazy"    Family History  Problem Relation Age of Onset   Diabetes Mother    Stroke Brother    Cerebral palsy Brother    Coronary artery disease Neg Hx     Family history reviewed and not pertinent    Prior to Admission medications   Medication Sig Start Date End Date Taking? Authorizing Provider  acetaminophen (TYLENOL) 500 MG tablet Take 500 mg by mouth 3 (three) times daily as needed for headache (pain). Take with tramadol    [provider]  atorvastatin (LIPITOR) 40 MG tablet TAKE 1 TABLET BY MOUTH DAILY AT 6 PM. 03/10/21   Nafziger, Tommi Rumps, NP  cholecalciferol (VITAMIN D3) 25 MCG (1000 UNIT) tablet Take 1,000 Units by mouth 2 (two) times daily.    [provider]  Coenzyme Q10 (CO Q-10) 300 MG CAPS Take 300 mg by  mouth in the morning.    [provider]  [facility] ASPIRIN ADULT LOW DOSE 81 MG chewable tablet CHEW 1 TABLET (81 MG TOTAL) BY MOUTH DAILY. 03/10/21   Nafziger, Tommi Rumps, NP  ELIQUIS 5 MG TABS tablet TAKE 1 TABLET BY MOUTH TWICE A DAY 03/10/21   Nafziger, Tommi Rumps, NP  ferrous gluconate (FERGON) 324 MG tablet TAKE 1 TABLET BY MOUTH DAILY WITH BREAKFAST 03/10/21   Nafziger, Tommi Rumps, NP  HM LIDOCAINE PATCH EX Apply 1 patch topically daily as needed (for back pain).    [provider]  Lidocaine-Menthol, Spray, 4-1 %  LIQD Apply 1 spray topically daily as needed (back/knee pain/restless legs).    [provider]  losartan (COZAAR) 25 MG tablet Take 1 tablet (25 mg total) by mouth daily. 05/29/21   Nafziger, Tommi Rumps, NP  Magnesium 200 MG TABS Take 200 mg by mouth every morning.    [provider]  metoprolol succinate (TOPROL-XL) 100 MG 24 hr tablet Take 1 tablet (100 mg total) by mouth daily. Take with or immediately following a meal. 05/09/21 06/08/21  Bonnell Public, MD  Multiple Vitamin (MULTIVITAMIN WITH MINERALS) TABS tablet Take 1 tablet by mouth 2 (two) times daily. Centrum Minis    [provider]  pantoprazole (PROTONIX) 40 MG tablet Take 1 tablet (40 mg total) by mouth daily. 05/08/21 05/08/22  Dana Allan I, MD  potassium chloride 20 MEQ/15ML (10%) SOLN Take 15 mLs (20 mEq total) by mouth daily. 05/19/21 08/17/21  [name], PA-C  senna-docusate (SENOKOT-S) 8.6-50 MG tablet Take 1 tablet by mouth 2 (two) times daily between meals as needed for mild constipation. 05/07/21   Mercy Riding, MD  sodium chloride (OCEAN) 0.65 % SOLN nasal spray Place 1 spray into both nostrils daily as needed for congestion.    [provider]  terazosin (HYTRIN) 10 MG capsule Take 1 capsule (10 mg total) by mouth daily at 12 noon. 12/16/20   Nafziger, Tommi Rumps, NP  torsemide (DEMADEX) 10 MG tablet Take 3 tablets (30 mg total) by mouth daily. 05/19/21   [name], PA-C  traMADol (ULTRAM) 50 MG tablet Take 50 mg by mouth every 6 (six) hours as needed for severe pain.    [provider]  vitamin B-12 (CYANOCOBALAMIN) 1000 MCG tablet Take 1 tablet (1,000 mcg total) by mouth daily. 04/16/21   Oswald Hillock, MD     Objective    Physical Exam: Vitals:   06/07/21 0000 06/07/21 0100 06/07/21 0300 06/07/21 0523  BP:  131/77 135/74 (!) 151/92  Pulse: 83 82 86 92  Resp:  (!) 21 (!) 31 16  Temp:    98.4 F (36.9 C)  TempSrc:    Oral  SpO2: 94% 96% 98% 94%  Weight:       Height:        General: appears to be stated age; alert, oriented; mildly increased work of breathing noted Skin: warm, dry, no rash Head:  AT/[place] Mouth:  Oral mucosa membranes appear moist, normal dentition Neck: supple; trachea midline Heart:  RRR; did not appreciate any M/R/G Lungs: CTAB, did not appreciate any wheezes, rales, or rhonchi Abdomen: + BS; soft, ND, NT Vascular: 2+ pedal pulses b/l; 2+ radial pulses b/l Extremities: no peripheral edema, no muscle wasting Neuro: strength and sensation intact in upper and lower extremities b/l    Labs on Admission: I have personally reviewed following labs and imaging studies  CBC: Recent Labs  Lab 06/06/21 2333  WBC 5.1  NEUTROABS 4.2  HGB 10.4*  HCT 32.7*  MCV 90.6  PLT 654   Basic Metabolic Panel: Recent Labs  Lab 06/06/21 2333  NA 134*  K 3.8  CL 99  CO2 25  GLUCOSE 131*  BUN 36*  CREATININE 1.01*  CALCIUM 9.0   GFR: Estimated Creatinine Clearance: 30.6 mL/min (A) (by C-G formula based on SCr of 1.01 mg/dL (H)). Liver Function Tests: Recent Labs  Lab 06/06/21 2333  AST 28  ALT 20  ALKPHOS 42  BILITOT 0.9  PROT 6.3*  ALBUMIN 3.8   No results for input(s): LIPASE, AMYLASE in the last 168 hours. No results for input(s): AMMONIA in the last 168 hours. Coagulation Profile: No results for input(s): INR, PROTIME in the last 168 hours. Cardiac Enzymes: No results for input(s): CKTOTAL, CKMB, CKMBINDEX, TROPONINI in the last 168 hours. BNP (last 3 results) No results for input(s): PROBNP in the last 8760 hours. HbA1C: No results for input(s): HGBA1C in the last 72 hours. CBG: No results for input(s): GLUCAP in the last 168 hours. Lipid Profile: No results for input(s): CHOL, HDL, LDLCALC, TRIG, CHOLHDL, LDLDIRECT in the last 72 hours. Thyroid Function Tests: No results for input(s): TSH, T4TOTAL, FREET4, T3FREE, THYROIDAB in the last 72 hours. Anemia Panel: No results for input(s): VITAMINB12, FOLATE,  FERRITIN, TIBC, IRON, RETICCTPCT in the last 72 hours. Urine analysis:    Component Value Date/Time   COLORURINE YELLOW 09/01/2020 1403   APPEARANCEUR HAZY (A) 09/01/2020 1403   LABSPEC 1.019 09/01/2020 1403   PHURINE 6.0 09/01/2020 1403   GLUCOSEU NEGATIVE 09/01/2020 1403   GLUCOSEU NEGATIVE 12/11/2019 1033   HGBUR NEGATIVE 09/01/2020 1403   [place] 09/01/2020 1403   [place] 09/01/2020 1403   PROTEINUR 30 (A) 09/01/2020 1403   UROBILINOGEN 0.2 12/11/2019 1033   NITRITE NEGATIVE 09/01/2020 1403   LEUKOCYTESUR TRACE (A) 09/01/2020 1403    Radiological Exams on Admission: DG Chest Portable 1 View  Result Date: 06/06/2021 CLINICAL DATA:  COVID. EXAM: PORTABLE CHEST 1 VIEW COMPARISON:  May 05, 2021 FINDINGS: Diffuse, chronic appearing increased interstitial lung markings are seen. Mild, stable left suprahilar atelectasis and/or infiltrate is noted. There is no evidence of a pleural effusion or pneumothorax. The cardiac silhouette is moderately enlarged. An artificial aortic valve is seen. There is marked severity calcification of the aortic arch. Degenerative changes are seen throughout the thoracic spine. IMPRESSION: Chronic appearing increased interstitial lung markings with mild, stable left suprahilar atelectasis and/or infiltrate. Electronically Signed   By: Virgina Norfolk M.D.   On: 06/06/2021 23:58      Assessment/Plan     Principal Problem:   COVID-19 virus infection Active Problems:   Pure hypercholesterolemia   Type II diabetes mellitus (HCC)   Persistent atrial fibrillation (HCC)   Acute respiratory failure with hypoxia (HCC)   Chronic systolic CHF (congestive heart failure) (HCC)   Generalized weakness   GERD (gastroesophageal reflux disease)     #) Severe COVID-19 infection: diagnosis on the basis of: 2 days of progressive new onset cough associated with subjective fever, chills, generalized myalgias, with evidence of increased  work of breathing, context of positive COVID-19 PCR performed in the ED today. Additionally, in the context of no known baseline supplemental O2 requirements, the patient is requiring 2 L nasal cannula in order to maintain O2 sats greater than or equal to 94%, after initial oxygen saturation was reportedly noted to be in the high 80s on room. In setting of  this acute hypoxia, criteria are met from patient's COVID-19 infection to be considered severe in nature. Consequently, there is a Grade 2c rec for systemic corticosteroids, which is further supported by treatment guidance recommendations from [place]'s Covid Treatment Guidelines.  Particular in the context of a documented history of underlying COPD, will select solumedrol over Decadron for this purpose.  Of note, in the setting of the patient's age greater than 34 along with multiple comorbidities that include chronic systolic heart failure, hypertension, this patient meets criteria to be considered high risk for a more complicated clinical course of COVID-19 infection, including increased probability for progression of the severity associated with this infection. Therefore, in the setting of symptomatic COVID-19 infection with acute respiratory symptoms requiring hospitalization for further evaluation and management thereof in this patient with the aforementioned high risk criteria who is felt to be early in the course of their infection given onset of respiratory symptoms starting less than 7 days ago, indications are met for initiation of remdesivir per treatment guidance recommendations from [place]'s Covid Treatment Guidelines. Of note, ALT found to be less than 220. Therefore, there is no contraindication for initiation of remdesivir on the basis of transaminitis.   Will closely monitor ensuing degree of hypoxia as well as associated trend in supplemental oxygen requirements. Does not appear to me indications for initiation of Tocilizumab at  this time, as further described below.  She has a history of COPD, but without overt evidence of acute COPD exacerbation at this time, although she is certainly at risk for ensuing development of such as a physiologic consequence of her presenting severe COVID-19 infection . Of note, in the setting of a history of diabetes, will initiate daily linagliptin as DPP-4 inhibitors have been shown to reduce mortality in patients with DM2 and a COVID-19.  Procalcitonin has been ordered, with result currently pending at this time.  Chest x-ray unchanged left suprahilar airspace opacity suggestive of atelectasis versus infiltrate in comparison to most recent prior chest x-ray on 05/05/2021, will showing chronic and unchanged increased interstitial lung markings, as further detailed above.  No clinical or radiographic evidence to suggest acute decompensated heart failure at this time.      Plan: Airborne and contact precautions. Monitor continuous pulse oximetry and monitor on telemetry. prn supplemental O2 to maintain O2 sats greater than or equal to 94%. Proning protocol initiated. PRN albuterol inhaler. Scheduled combivent inhaler q6H. PRN acetaminophen for fever. Start solumedrol and remdesivir, as above. Check and trend inflammatory markers (fibrinogen, d dimer or fibrin derivatives, crp, ferritin, LDH). Check serum magnesium and phosphorus levels. Check CMP and CBC in the morning. Check blood gas .Flutter valve and incentive spirometry. If rapid progression of supplemental oxygen demand, development of need for high flow O2, or worsening hypoxemia with CRP > 10, would consider initiation of Tocilizumab at that point.  Follow for results of procalcitonin, which, if non-elevated in the context of the pro inflammatory state associated with COVID-19, would provide a high degree of negative predictive value that would further decrease the likelihood of any contribution from bacterial pneumonia. In setting of DM2, will  start linagliptin 5 mg PO Qdaily for associated mortality benefit, as above.       #) Acute hypoxic respiratory distress: in the context of no known baseline supplemental oxygen requirements, presenting O2 sat reported to have been in the high 80s on room air with ensuing improvement into the mid 90s on 2 L nasal cannula in the text of  evidence of increased work of breathing and presenting new onset cough, thereby meeting criteria for acute hypoxic respiratory distress as opposed to acute hypoxic respiratory failure at this time. Appears to be on the basis of COVID-19 infection, as above.  This is in the context of a documented history of underlying COPD, although without overt evidence of underlying exacerbation at this time, but with increased risk for ensuing development of such, as further detailed above . ACS is felt to be less likely at this time in the absence of any recent chest pain , but will check EKG to further evaluate .  No clinical or radiographic evidence of suggest acutely decompensated heart failure at this time. While there is increased risk for acute PE in the setting of COVID-19 infection, clinically, this appears to be less likely at this time, particularly given reported good compliance on chronic anticoagulation with Eliquis. If rapid progression of supplemental oxygen demand or if development of need for high flow O2, would consider initiation of Tocilizumab at that point.   Plan: further evaluation and management of presenting COVID-19 infection, as above, including monitoring of continuous pulse oximetry with prn supplemental O2 to maintain O2 sats greater than or equal to 94%. monitor on telemetry. Trending of inflammatory markers, as above. Check CMP and CBC in the morning. Check serum Mg and Phos levels. Check blood gas f.  flutter valve and incentive spirometry. PRN albuterol inhaler. Scheduled combivent inhaler q6H. solumedrol on remdesivir, as above.  Follow-up results of  procalcitonin.  Add on BNP.     #) Generalized weakness: Patient reports to 3 days of generalized weakness in the absence of any acute focal neurologic deficits to suggest an acute CVA.,  I suspect that the patient's generalized weakness is a sense of physiologic stress stemming from presenting severe COVID-19 infection and corresponding acute hypoxic respiratory distress, as further detailed above.  Plan: Further evaluation management of presenting severe COVID-19 infection, as above.  Fall precautions.  Check TSH, MMA.  Physical therapy consult ordered for the morning.        #) Chronic systolic heart failure: documented history of such, with most recent echocardiogram performed in May 2022 notable for LVEF 25 to 30%, with additional details as conveyed above. No clinical evidence to suggest acutely decompensated heart failure at this time. Patient's home diuretic regimen reportedly consists of the following: Torsemide 30 mg p.o. daily. Home cardiac medications also include the following: Losartan and Toprol-XL.    Plan: monitor strict I's & O's and daily weights. Repeat BMP in the morning. Check serum magnesium level. Continue home diuretic regimen. Continue home losartan and Toprol-XL.  Check EKG and BNP.        #) Persistent atrial fibrillation: Documented history of such. In the setting of a CHA2DS2-VASc score of 7, there is an indication for the patient to be on chronic anticoagulation for thromboembolic prophylaxis. Consistent with this, the patient is chronically anticoagulated on Eliquis. Home AV nodal blocking regimen: Toprol-XL.    Plan: monitor strict I's & O's and daily weights. Repeat BMP and CBC in the morning. Check serum magnesium level. Continue home AV nodal blocking regimen, as above.  Continue home Eliquis.      #) Type 2 diabetes mellitus: Documented history of such, now managed via lifestyle modifications in the absence of any insulin or oral hypoglycemic  agents at home in the setting of most recent hemoglobin A1c noted to be 5.9% when checked in January 22.  Presenting blood sugar  per presenting CMP found to be 131.  Will monitor Accu-Cheks as detailed below, particularly given increased risk for ensuing development of hyperglycemia given physiologic stress as relates to presenting severe COVID-19 infection as well as from elevated risk of pharmacologic induced hyperglycemia to solumedrol as component of management of presenting COVID-19 infection.  Plan: Accu-Cheks before every meal and at bedtime with very low-dose sliding scale insulin.     #) GERD: On Protonix as an outpatient.  Plan: Continue home PPI.      #) Hyperlipidemia: Documented history of such, on high intensity atorvastatin as an outpatient.  Plan: Continue home atorvastatin.      DVT prophylaxis: scd's plus continuation of home Eliquis Code Status: DNR Family Communication: none Disposition Plan: Per Rounding Team Consults called: none;  Admission status: Inpatient     Of note, this patient was added by me to the following Admit List/Treatment Team: [email or web address].    Of note, the Adult Admission Order Set (Multimorbid order set) was used by me in the admission process for this patient.   PLEASE NOTE THAT [employer]AGON DICT[employer] SOFTWARE WAS USED IN THE CONSTRUCTION OF THIS NOTE.   Rhetta Mura DO [facility] Pager [phone] From [place]   06/07/2021, 5:51 AM

## 2021-06-07 NOTE — Progress Notes (Signed)
PROGRESS NOTE  Tara Fields IRJ:[id number] DOB: Jan 06, 1928 DOA: 06/06/2021 PCP: [name], NP  HPI/Recap of past 83 hours: 87 year old female with past medical history significant for systolic heart failure, COPD, aortic stenosis status post valve replacement and persistent atrial fibrillation on Eliquis admitted on 10/22 after having 2 days of cough and shortness of breath and weakness and positive home COVID test, confirmed in the emergency room.  Patient found to be hypoxic, requiring 2 L nasal cannula.  Patient admitted to the hospitalist service and started on Remdisivir and Solu-Medrol.  BNP came back elevated at almost 3300.  Patient started on IV Lasix.  Patient herself doing okay, feels fatigued and breathing comfortably on 2 L nasal cannula.  Assessment/Plan: Principal Problem: Acute systolic CHF and [id number] virus infection causing acute respiratory failure with hypoxia: Given patient's advanced age and multiple comorbidities, high risk for mortality.  Suspect her COVID is only mildly contributing to her hypoxia and this is almost all from CHF.  Have started diuresis.  Monitor weight and input/output.  Echocardiogram from May of this year notes ejection fraction of 25-30%. Active Problems:   Pure hypercholesterolemia   Type II diabetes mellitus ([place]): I believe this is an incorrect diagnosis.  We will removed from her problem list.  A1c 1 month ago at 5.0 and she is not on any medications.    Persistent atrial fibrillation [facility]): Heart rate stable, continue Eliquis.     Generalized weakness: PT and OT to see   GERD (gastroesophageal reflux disease) Overweight: Patient meets criteria BMI greater than 25  Code Status: DNR  Family Communication: Updated son.  Disposition Plan: Hopefully will make quick recovery and can discharge home   Consultants: None  Procedures: None  Antimicrobials: IV Remdisivir 10/22-present  DVT prophylaxis: Already on  Eliquis  Level of care: Med-Surg   Objective: Vitals:   06/07/21 0300 06/07/21 0523  BP: 135/74 (!) 151/92  Pulse: 86 92  Resp: (!) 31 16  Temp:  98.4 F (36.9 C)  SpO2: 98% 94%    Intake/Output Summary (Last 24 hours) at 06/07/2021 0920 Last data filed at 06/07/2021 0536 Gross per 24 hour  Intake 0 ml  Output --  Net 0 ml   Filed Weights   06/06/21 2029  Weight: 65.3 kg   Body mass index is 28.12 kg/m.  Exam:  General: Alert and oriented x3, no acute distress HEENT: Normocephalic and atraumatic, mucous membranes are moist Cardiovascular: Irregular rhythm, rate controlled Respiratory: Decreased breath sounds bibasilar Abdomen: Soft, nontender, nondistended, positive bowel sounds Musculoskeletal: No clubbing or cyanosis, trace pitting edema Skin: No skin breaks, tears or lesions Psychiatry: Appropriate, no evidence of psychoses Neurology: No focal deficits   Data Reviewed: CBC: Recent Labs  Lab 06/06/21 2333  WBC 5.1  NEUTROABS 4.2  HGB 10.4*  HCT 32.7*  MCV 90.6  PLT 932   Basic Metabolic Panel: Recent Labs  Lab 06/06/21 2333  NA 134*  K 3.8  CL 99  CO2 25  GLUCOSE 131*  BUN 36*  CREATININE 1.01*  CALCIUM 9.0   GFR: Estimated Creatinine Clearance: 30.6 mL/min (A) (by C-G formula based on SCr of 1.01 mg/dL (H)). Liver Function Tests: Recent Labs  Lab 06/06/21 2333  AST 28  ALT 20  ALKPHOS 42  BILITOT 0.9  PROT 6.3*  ALBUMIN 3.8   No results for input(s): LIPASE, AMYLASE in the last 168 hours. No results for input(s): AMMONIA in the last 168 hours. Coagulation Profile: No results for  input(s): INR, PROTIME in the last 168 hours. Cardiac Enzymes: No results for input(s): CKTOTAL, CKMB, CKMBINDEX, TROPONINI in the last 168 hours. BNP (last 3 results) No results for input(s): PROBNP in the last 8760 hours. HbA1C: No results for input(s): HGBA1C in the last 72 hours. CBG: Recent Labs  Lab 06/07/21 0836  GLUCAP 149*   Lipid  Profile: No results for input(s): CHOL, HDL, LDLCALC, TRIG, CHOLHDL, LDLDIRECT in the last 72 hours. Thyroid Function Tests: No results for input(s): TSH, T4TOTAL, FREET4, T3FREE, THYROIDAB in the last 72 hours. Anemia Panel: No results for input(s): VITAMINB12, FOLATE, FERRITIN, TIBC, IRON, RETICCTPCT in the last 72 hours. Urine analysis:    Component Value Date/Time   COLORURINE YELLOW 09/01/2020 1403   APPEARANCEUR HAZY (A) 09/01/2020 1403   LABSPEC 1.019 09/01/2020 1403   PHURINE 6.0 09/01/2020 1403   [place] 09/01/2020 1403   GLUCOSEU NEGATIVE 12/11/2019 1033   HGBUR NEGATIVE 09/01/2020 [place] 09/01/2020 1403   KETONESUR NEGATIVE 09/01/2020 1403   PROTEINUR 30 (A) 09/01/2020 1403   UROBILINOGEN 0.2 12/11/2019 1033   NITRITE NEGATIVE 09/01/2020 1403   LEUKOCYTESUR TRACE (A) 09/01/2020 1403   Sepsis Labs: @LABRCNTIP (procalcitonin:4,lacticidven:4)  ) Recent Results (from the past 240 hour(s))  Resp Panel by RT-PCR (Flu A&B, Covid) Nasopharyngeal Swab     Status: Abnormal   Collection Time: 06/07/21  1:51 AM   Specimen: Nasopharyngeal Swab; Nasopharyngeal(NP) swabs in vial transport medium  Result Value Ref Range Status   SARS Coronavirus 2 by RT PCR POSITIVE (A) NEGATIVE Final    Comment: RESULT CALLED TO, READ BACK BY AND VERIFIED WITH: KATA,Z AT 0256 ON 06/07/2021 BY MOSLEY,J (NOTE) SARS-CoV-2 target nucleic acids are DETECTED.  The SARS-CoV-2 RNA is generally detectable in upper respiratory specimens during the acute phase of infection. Positive results are indicative of the presence of the identified virus, but do not rule out bacterial infection or co-infection with other pathogens not detected by the test. Clinical correlation with patient history and other diagnostic information is necessary to determine patient infection status. The expected result is Negative.  Fact Sheet for  Patients: EntrepreneurPulse.com.au  Fact Sheet for Healthcare Providers: IncredibleEmployment.be  This test is not yet approved or cleared by the [place] FDA and  has been authorized for detection and/or diagnosis of SARS-CoV-2 by FDA under an Emergency Use Authorization (EUA).  This EUA will remain in effect (meaning this test  can be used) for the duration of  the COVID-19 declaration under Section 564(b)(1) of the Act, 21 U.S.C. section 360bbb-3(b)(1), unless the authorization is terminated or revoked sooner.     Influenza A by PCR NEGATIVE NEGATIVE Final   Influenza B by PCR NEGATIVE NEGATIVE Final    Comment: (NOTE) The Xpert Xpress SARS-CoV-2/FLU/RSV plus assay is intended as an aid in the diagnosis of influenza from Nasopharyngeal swab specimens and should not be used as a sole basis for treatment. Nasal washings and aspirates are unacceptable for Xpert Xpress SARS-CoV-2/FLU/RSV testing.  Fact Sheet for Patients: EntrepreneurPulse.com.au  Fact Sheet for Healthcare Providers: IncredibleEmployment.be  This test is not yet approved or cleared by the [place] FDA and has been authorized for detection and/or diagnosis of SARS-CoV-2 by FDA under an Emergency Use Authorization (EUA). This EUA will remain in effect (meaning this test can be used) for the duration of the COVID-19 declaration under Section 564(b)(1) of the Act, 21 U.S.C. section 360bbb-3(b)(1), unless the authorization is terminated or revoked.  Performed at [facility], [address] [place], [place] [address]       Studies: DG Chest Portable 1 View  Result Date: 06/06/2021 CLINICAL DATA:  COVID. EXAM: PORTABLE CHEST 1 VIEW COMPARISON:  May 05, 2021 FINDINGS: Diffuse, chronic appearing increased interstitial lung markings are seen. Mild, stable left suprahilar atelectasis and/or infiltrate is  noted. There is no evidence of a pleural effusion or pneumothorax. The cardiac silhouette is moderately enlarged. An artificial aortic valve is seen. There is marked severity calcification of the aortic arch. Degenerative changes are seen throughout the thoracic spine. IMPRESSION: Chronic appearing increased interstitial lung markings with mild, stable left suprahilar atelectasis and/or infiltrate. Electronically Signed   By: Virgina Norfolk M.D.   On: 06/06/2021 23:58    Scheduled Meds:  apixaban  5 mg Oral BID   aspirin  81 mg Oral Daily   atorvastatin  40 mg Oral Daily   insulin aspart  0-6 Units Subcutaneous TID WC   Ipratropium-Albuterol  1 puff Inhalation Q6H WA   linagliptin  5 mg Oral Daily   losartan  25 mg Oral Daily   methylPREDNISolone (SOLU-MEDROL) injection  30 mg Intravenous BID   Followed by   Derrill Memo ON 06/10/2021] predniSONE  50 mg Oral Daily   metoprolol succinate  100 mg Oral Daily   pantoprazole  40 mg Oral Daily   potassium chloride  20 mEq Oral Daily   torsemide  30 mg Oral Daily    Continuous Infusions:  [START ON 06/08/2021] remdesivir 100 mg in NS 100 mL       LOS: 0 days     Annita Brod, MD [facility]   06/07/2021, 9:20 AM

## 2021-06-08 ENCOUNTER — Telehealth: Payer: Self-pay | Admitting: Adult Health

## 2021-06-08 DIAGNOSIS — U071 COVID-19: Secondary | ICD-10-CM | POA: Diagnosis not present

## 2021-06-08 DIAGNOSIS — J9601 Acute respiratory failure with hypoxia: Secondary | ICD-10-CM

## 2021-06-08 DIAGNOSIS — I5023 Acute on chronic systolic (congestive) heart failure: Secondary | ICD-10-CM | POA: Diagnosis not present

## 2021-06-08 DIAGNOSIS — I4819 Other persistent atrial fibrillation: Secondary | ICD-10-CM | POA: Diagnosis not present

## 2021-06-08 LAB — CBC
HCT: 33.7 % — ABNORMAL LOW (ref 36.0–46.0)
Hemoglobin: 10.7 g/dL — ABNORMAL LOW (ref 12.0–15.0)
MCH: 29.1 pg (ref 26.0–34.0)
MCHC: 31.8 g/dL (ref 30.0–36.0)
MCV: 91.6 fL (ref 80.0–100.0)
Platelets: 216 10*3/uL (ref 150–400)
RBC: 3.68 MIL/uL — ABNORMAL LOW (ref 3.87–5.11)
RDW: 15.1 % (ref 11.5–15.5)
WBC: 6.9 10*3/uL (ref 4.0–10.5)
nRBC: 0 % (ref 0.0–0.2)

## 2021-06-08 LAB — BASIC METABOLIC PANEL
Anion gap: 9 (ref 5–15)
BUN: 52 mg/dL — ABNORMAL HIGH (ref 8–23)
CO2: 24 mmol/L (ref 22–32)
Calcium: 8.3 mg/dL — ABNORMAL LOW (ref 8.9–10.3)
Chloride: 103 mmol/L (ref 98–111)
Creatinine, Ser: 0.94 mg/dL (ref 0.44–1.00)
GFR, Estimated: 57 mL/min — ABNORMAL LOW (ref >60)
Glucose, Bld: 170 mg/dL — ABNORMAL HIGH (ref 70–99)
Potassium: 3.6 mmol/L (ref 3.5–5.1)
Sodium: 136 mmol/L (ref 135–145)

## 2021-06-08 LAB — GLUCOSE, CAPILLARY
Glucose-Capillary: 155 mg/dL — ABNORMAL HIGH (ref 70–99)
Glucose-Capillary: 169 mg/dL — ABNORMAL HIGH (ref 70–99)
Glucose-Capillary: 154 mg/dL — ABNORMAL HIGH (ref 70–99)
Glucose-Capillary: 161 mg/dL — ABNORMAL HIGH (ref 70–99)

## 2021-06-08 NOTE — Telephone Encounter (Signed)
Levada Dy from [facility] call and stated she would like a call back about a order on pt.Levada Dy 's # is [phone] opt 2

## 2021-06-08 NOTE — TOC Initial Note (Signed)
Transition of Care [facility]) - Initial/Assessment Note   Patient Details  Name: Tara Fields MRN: [id number] Date of Birth: [birth year]  Transition of Care [facility]) CM/SW Contact:    Sherie Don, LCSW Phone Number: 06/08/2021, 11:52 AM  Clinical Narrative: Readmission checklist completed due to high readmission score. CSW spoke with patient's son, Zeda Gangwer, who is the patient's legal guardian. Per son, patient lives at [facility] independent living facility. Patient is independent with her ADLs at baseline and uses a rolling walker and shower chair. Patient is able to afford her medications each month and takes these as prescribed. Son reported patient is [facility] with [facility] as patient did not like using [facility] at the facility.  CSW followed up with [facility] and the facility will only need a call when the patient is discharging as they will not need any discharge paperwork.  CSW confirmed with Ramond Marrow with [facility] that patient is active with [facility] for RN and PT. TOC to follow.  Expected Discharge Plan: [place] Barriers to Discharge: No Barriers Identified  Patient Goals and CMS Choice Patient states their goals for this hospitalization and ongoing recovery are:: Return home to [place] independent living CMS Medicare.gov Compare Post Acute Care list provided to:: Patient Choice offered to / list presented to : [place] / Guardian  Expected Discharge Plan and Services Expected Discharge Plan: [place] In-house Referral: Clinical Social Work Discharge Planning Services: NA Post Acute Care Choice: [place] arrangements for the past 2 months: [place]            DME Arranged: N/A DME Agency: NA HH Arranged: PT, RN [place] Agency: [place] ([facility]) Date HH Agency Contacted: 06/08/21 Time HH Agency Contacted: [phone] Representative spoke with at [place]: Ramond Marrow  Prior Living  Arrangements/Services Living arrangements for the past 2 months: Materials engineer Lives with:: Self Patient language and need for interpreter reviewed:: Yes Do you feel safe going back to the place where you live?: Yes      Need for Family Participation in Patient Care: Yes (Comment) (Patient's son is her legal guardian.) Care giver support system in place?: Yes (comment) Current home services: DME, Home RN, Home PT (DME: rolling walker, shower chair; HH: PT, RN) Criminal Activity/Legal Involvement Pertinent to Current Situation/Hospitalization: No - Comment as needed  Activities of Daily Living Home Assistive Devices/Equipment: Eyeglasses, Dentures (specify type), Walker (specify type), Raised toilet seat with rails (rolling walker) ADL Screening (condition at time of admission) Patient's cognitive ability adequate to safely complete daily activities?: Yes (Hx of Dementia per report, but A & O at this time) Is the patient deaf or have difficulty hearing?: Yes Does the patient have difficulty seeing, even when wearing glasses/contacts?: Yes Does the patient have difficulty concentrating, remembering, or making decisions?: Yes Patient able to express need for assistance with ADLs?: Yes Does the patient have difficulty dressing or bathing?: No Independently performs ADLs?: Yes (appropriate for developmental age) Does the patient have difficulty walking or climbing stairs?: Yes Weakness of Legs: Both Weakness of Arms/Hands: Both  Permission Sought/Granted Permission sought to share information with : Customer service manager, Other (comment) Permission granted to share information with : Yes, Verbal Permission Granted Permission granted to share info w AGENCY: [facility], [facility]  Emotional Assessment Alcohol / Substance Use: Not Applicable Psych Involvement: No (comment)  Admission diagnosis:  Acute respiratory failure with hypoxia ([place])  [J96.01]  COVID-19 [U07.1] Patient Active Problem List   Diagnosis Date Noted   COVID-19 virus infection 06/07/2021   Generalized weakness 06/07/2021   GERD (gastroesophageal reflux disease) 06/07/2021   CHF (congestive heart failure) ([place]) 05/05/2021   GI bleed 04/13/2021   Anemia 04/13/2021   Symptomatic anemia 04/13/2021   Prolonged QT interval 04/13/2021   CKD (chronic kidney disease), stage III ([place]) 04/13/2021   Acute on chronic systolic CHF (congestive heart failure) ([place]) 01/29/2021   Cognitive deficits    Pressure injury of skin 12/19/2020   Acute exacerbation of CHF (congestive heart failure) ([place]) 12/18/2020   COPD (chronic obstructive pulmonary disease) ([place]) 09/02/2020   Dyslipidemia 09/02/2020   Acute respiratory failure with hypoxia ([place]) 09/01/2020   Pulmonary nodule 1 cm or greater in diameter 01/03/2020   S/P TAVR (transcatheter aortic valve replacement) 05/03/2019   Primary osteoarthritis involving multiple joints 05/03/2019   Persistent atrial fibrillation ([place]) 05/03/2019   Bilateral carotid artery stenosis 12/01/2017   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/13/2014   Aortic valve disorder 05/03/2014   Chronic obstructive bronchitis without exacerbation ([place]) 05/03/2014   Derangement of left knee 05/03/2014   Esophageal reflux 05/03/2014   Essential hypertension 05/03/2014   Pure hypercholesterolemia 05/03/2014   Vitamin D deficiency 05/03/2014   PCP:  [name], NP Pharmacy:   [id number] - [place], [place] - [place] [address] [place] [address] Phone: [phone] Fax: [phone]  Readmission Risk Interventions Readmission Risk Prevention Plan 06/08/2021 05/07/2021 04/16/2021  Transportation Screening Complete Complete Complete  PCP or Specialist Appt within 3-5 Days - - Complete  HRI or Home Care Consult - - Complete  Palliative Care Screening - - Not Applicable  Medication  Review (RN Care Manager) Complete Complete -  PCP or Specialist appointment within 3-5 days of discharge - Complete -  [place] or Home Care Consult Complete Complete -  SW Recovery Care/Counseling Consult Complete - -  Palliative Care Screening Not Applicable Not Applicable -  [place] Not Applicable Not Applicable -  Some recent data might be hidden

## 2021-06-08 NOTE — Evaluation (Signed)
Physical Therapy Evaluation Patient Details Name: Tara Fields MRN: [id number] DOB: [birth year] Today's Date: 06/08/2021  History of Present Illness  87 year old female with past medical history significant for systolic heart failure, COPD, aortic stenosis status post valve replacement and persistent atrial fibrillation on Eliquis admitted on 10/22 after having 2 days of cough and shortness of breath and weakness and positive home COVID test, confirmed in the emergency room.  Clinical Impression  Pt admitted with above diagnosis.  Pt moving well today, pleased to be OOB. Amb ~ 1' with RW and min to min/ guard assist for safety. SpO2=97-98% on RA. May benefit from [place] on return to ALF, no DME needs.   Pt currently with functional limitations due to the deficits listed below (see PT Problem List). Pt will benefit from skilled PT to increase their independence and safety with mobility to allow discharge to the venue listed below.          Recommendations for follow up therapy are one component of a multi-disciplinary discharge planning process, led by the attending physician.  Recommendations may be updated based on patient status, additional functional criteria and insurance authorization.  Follow Up Recommendations Home health PT    Assistance Recommended at Discharge PRN  Functional Status Assessment    Equipment Recommendations  None recommended by PT    Recommendations for Other Services       Precautions / Restrictions Precautions Precautions: Fall Restrictions Weight Bearing Restrictions: No      Mobility  Bed Mobility Overal bed mobility: Modified Independent             General bed mobility comments: bed flat no assist    Transfers Overall transfer level: Needs assistance Equipment used: Rolling walker (2 wheels) Transfers: Sit to/from Stand Sit to Stand: Min guard;Supervision           General transfer comment: cues for hand placement, pt  demonstrates good awareness of lines and safety    Ambulation/Gait   Gait Distance (Feet): 80 Feet Assistive device: Rolling walker (2 wheels) Gait Pattern/deviations: Step-through pattern  Min to min/guard assist    General Gait Details: cues for RW position from self  (pt is used to amb with rollator)  Stairs            Wheelchair Mobility    Modified Rankin (Stroke Patients Only)       Balance Overall balance assessment: Needs assistance Sitting-balance support: Feet supported;No upper extremity supported Sitting balance-Leahy Scale: Good     Standing balance support: Reliant on assistive device for balance;During functional activity Standing balance-Leahy Scale: Fair                               Pertinent Vitals/Pain Pain Assessment: No/denies pain    Home Living Family/patient expects to be discharged to:: Assisted living                 Home Equipment: Rollator (4 wheels);Cane - single point      Prior Function Prior Level of Function : Independent/Modified Independent                     Hand Dominance        Extremity/Trunk Assessment   Upper Extremity Assessment Upper Extremity Assessment: Defer to OT evaluation    Lower Extremity Assessment Lower Extremity Assessment: Overall WFL for tasks assessed       Communication   Communication: No  difficulties  Cognition Arousal/Alertness: Awake/alert Behavior During Therapy: WFL for tasks assessed/performed Overall Cognitive Status: Within Functional Limits for tasks assessed                                          General Comments      Exercises     Assessment/Plan    PT Assessment Patient needs continued PT services  PT Problem List Decreased strength;Decreased activity tolerance;Decreased balance;Decreased knowledge of use of DME;Decreased mobility       PT Treatment Interventions DME instruction;Therapeutic activities;Gait  training;Functional mobility training;Therapeutic exercise;Patient/family education    PT Goals (Current goals can be found in the Care Plan section)  Acute Rehab PT Goals Patient Stated Goal: home PT Goal Formulation: With patient Time For Goal Achievement: 06/22/21 Potential to Achieve Goals: Good    Frequency Min 3X/week   Barriers to discharge        Co-evaluation               AM-PAC PT "6 Clicks" Mobility  Outcome Measure Help needed turning from your back to your side while in a flat bed without using bedrails?: A Little Help needed moving from lying on your back to sitting on the side of a flat bed without using bedrails?: None Help needed moving to and from a bed to a chair (including a wheelchair)?: A Little Help needed standing up from a chair using your arms (e.g., wheelchair or bedside chair)?: A Little Help needed to walk in hospital room?: A Little Help needed climbing 3-5 steps with a railing? : A Little 6 Click Score: 19    End of Session Equipment Utilized During Treatment: Gait belt Activity Tolerance: Patient tolerated treatment well Patient left: in chair;with call bell/phone within reach   PT Visit Diagnosis: Other abnormalities of gait and mobility (R26.89);Difficulty in walking, not elsewhere classified (R26.2)    Time: 1202-1236 PT Time Calculation (min) (ACUTE ONLY): 34 min   Charges:   PT Evaluation $PT Eval Low Complexity: 1 Low PT Treatments $Gait Training: 8-22 mins        Baxter Flattery, PT  Acute Rehab Dept ([place]) [phone] Pager [phone]  06/08/2021   [facility] 06/08/2021, 12:58 PM

## 2021-06-08 NOTE — Progress Notes (Signed)
PROGRESS NOTE  Tara Fields EAV:[id number] DOB: 1928-06-18 DOA: 06/06/2021 PCP: [name], NP  HPI/Recap of past 88 hours: 87 year old female with past medical history significant for systolic heart failure, COPD, aortic stenosis status post valve replacement and persistent atrial fibrillation on Eliquis admitted on 10/22 after having 2 days of cough and shortness of breath and weakness and positive home COVID test, confirmed in the emergency room.  Patient found to be hypoxic, requiring 2 L nasal cannula.  Patient admitted to the hospitalist service and started on Remdisivir and Solu-Medrol.  BNP came back elevated at almost 3300.  Patient started on IV Lasix.  Patient feeling better, otherwise no complaints.  Patient has diuresed patient has diuresed almost 1.5 L since Lasix has been started.  Assessment/Plan: Principal Problem: Acute systolic CHF and [id number] virus infection causing acute respiratory failure with hypoxia: Given patient's advanced age and multiple comorbidities, high risk for mortality.  Suspect her COVID is only mildly contributing to her hypoxia and this is almost all from CHF.  Have started diuresis.  Monitor weight and input/output.  Echocardiogram from May of this year notes ejection fraction of 25-30%. Active Problems:   Pure hypercholesterolemia   Type II diabetes mellitus ([place]): I believe this is an incorrect diagnosis.  We will removed from her problem list.  A1c 1 month ago at 5.0 and she is not on any medications.    Persistent atrial fibrillation [facility]): Heart rate stable, continue Eliquis.     Generalized weakness: PT and OT to see   GERD (gastroesophageal reflux disease) Overweight: Patient meets criteria BMI greater than 25  Code Status: DNR  Family Communication: Left message for son  Disposition Plan: Potential discharge in the next few days, likely 10/26   Consultants: None  Procedures: None  Antimicrobials: IV Remdisivir  10/22-10/24  DVT prophylaxis: Already on Eliquis  Level of care: Med-Surg   Objective: Vitals:   06/08/21 0121 06/08/21 0540  BP: 138/82 (!) 141/97  Pulse: 82 76  Resp: 18 17  Temp: (!) 97.3 F (36.3 C) 97.6 F (36.4 C)  SpO2: 99% 99%    Intake/Output Summary (Last 24 hours) at 06/08/2021 0914 Last data filed at 06/08/2021 0600 Gross per 24 hour  Intake 960 ml  Output 1000 ml  Net -40 ml    Filed Weights   06/06/21 2029 06/08/21 0500  Weight: 65.3 kg 67.1 kg   Body mass index is 28.9 kg/m.  Exam:  General: Alert and oriented x3, no acute distress HEENT: Normocephalic and atraumatic, mucous membranes are moist Cardiovascular: Irregular rhythm, rate controlled Respiratory: Decreased breath sounds bibasilar Abdomen: Soft, nontender, nondistended, positive bowel sounds Musculoskeletal: No clubbing or cyanosis, trace pitting edema Skin: No skin breaks, tears or lesions Psychiatry: Appropriate, no evidence of psychoses Neurology: No focal deficits   Data Reviewed: CBC: Recent Labs  Lab 06/06/21 2333 06/07/21 0955 06/08/21 0414  WBC 5.1 5.3 6.9  NEUTROABS 4.2 4.7  --   HGB 10.4* 11.6* 10.7*  HCT 32.7* 36.9 33.7*  MCV 90.6 93.4 91.6  PLT 191 182 130    Basic Metabolic Panel: Recent Labs  Lab 06/06/21 2333 06/07/21 0955 06/08/21 0414  NA 134* 136 136  K 3.8 4.0 3.6  CL 99 100 103  CO2 25 25 24   GLUCOSE 131* 189* 170*  BUN 36* 40* 52*  CREATININE 1.01* 1.00 0.94  CALCIUM 9.0 8.9 8.3*  MG  --  2.1  --   PHOS  --  4.1  --  GFR: Estimated Creatinine Clearance: 33.3 mL/min (by C-G formula based on SCr of 0.94 mg/dL). Liver Function Tests: Recent Labs  Lab 06/06/21 2333 06/07/21 0955  AST 28 42*  ALT 20 26  ALKPHOS 42 50  BILITOT 0.9 1.2  PROT 6.3* 7.0  ALBUMIN 3.8 3.9    No results for input(s): LIPASE, AMYLASE in the last 168 hours. No results for input(s): AMMONIA in the last 168 hours. Coagulation Profile: No results for  input(s): INR, PROTIME in the last 168 hours. Cardiac Enzymes: No results for input(s): CKTOTAL, CKMB, CKMBINDEX, TROPONINI in the last 168 hours. BNP (last 3 results) No results for input(s): PROBNP in the last 8760 hours. HbA1C: No results for input(s): HGBA1C in the last 72 hours. CBG: Recent Labs  Lab 06/07/21 0836 06/07/21 1126 06/07/21 1638 06/07/21 2144 06/08/21 0728  GLUCAP 149* 142* 207* 154* 155*    Lipid Profile: No results for input(s): CHOL, HDL, LDLCALC, TRIG, CHOLHDL, LDLDIRECT in the last 72 hours. Thyroid Function Tests: Recent Labs    06/07/21 0955  TSH 0.692   Anemia Panel: Recent Labs    06/07/21 0955  FERRITIN 135   Urine analysis:    Component Value Date/Time   COLORURINE YELLOW 09/01/2020 1403   APPEARANCEUR HAZY (A) 09/01/2020 1403   LABSPEC 1.019 09/01/2020 1403   PHURINE 6.0 09/01/2020 1403   [place] 09/01/2020 1403   GLUCOSEU NEGATIVE 12/11/2019 1033   HGBUR NEGATIVE 09/01/2020 1403   [place] 09/01/2020 1403   [place] 09/01/2020 1403   PROTEINUR 30 (A) 09/01/2020 1403   UROBILINOGEN 0.2 12/11/2019 1033   NITRITE NEGATIVE 09/01/2020 1403   LEUKOCYTESUR TRACE (A) 09/01/2020 1403   Sepsis Labs: @LABRCNTIP (procalcitonin:4,lacticidven:4)  ) Recent Results (from the past 240 hour(s))  Resp Panel by RT-PCR (Flu A&B, Covid) Nasopharyngeal Swab     Status: Abnormal   Collection Time: 06/07/21  1:51 AM   Specimen: Nasopharyngeal Swab; Nasopharyngeal(NP) swabs in vial transport medium  Result Value Ref Range Status   SARS Coronavirus 2 by RT PCR POSITIVE (A) NEGATIVE Final    Comment: RESULT CALLED TO, READ BACK BY AND VERIFIED WITH: KATA,Z AT 0256 ON 06/07/2021 BY MOSLEY,J (NOTE) SARS-CoV-2 target nucleic acids are DETECTED.  The SARS-CoV-2 RNA is generally detectable in upper respiratory specimens during the acute phase of infection. Positive results are indicative of the presence of the identified  virus, but do not rule out bacterial infection or co-infection with other pathogens not detected by the test. Clinical correlation with patient history and other diagnostic information is necessary to determine patient infection status. The expected result is Negative.  Fact Sheet for Patients: EntrepreneurPulse.com.au  Fact Sheet for Healthcare Providers: IncredibleEmployment.be  This test is not yet approved or cleared by the [place] FDA and  has been authorized for detection and/or diagnosis of SARS-CoV-2 by FDA under an Emergency Use Authorization (EUA).  This EUA will remain in effect (meaning this test  can be used) for the duration of  the COVID-19 declaration under Section 564(b)(1) of the Act, 21 U.S.C. section 360bbb-3(b)(1), unless the authorization is terminated or revoked sooner.     Influenza A by PCR NEGATIVE NEGATIVE Final   Influenza B by PCR NEGATIVE NEGATIVE Final    Comment: (NOTE) The Xpert Xpress SARS-CoV-2/FLU/RSV plus assay is intended as an aid in the diagnosis of influenza from Nasopharyngeal swab specimens and should not be used as a sole basis for treatment. Nasal washings and aspirates are unacceptable for Xpert Xpress  SARS-CoV-2/FLU/RSV testing.  Fact Sheet for Patients: EntrepreneurPulse.com.au  Fact Sheet for Healthcare Providers: IncredibleEmployment.be  This test is not yet approved or cleared by the [place] FDA and has been authorized for detection and/or diagnosis of SARS-CoV-2 by FDA under an Emergency Use Authorization (EUA). This EUA will remain in effect (meaning this test can be used) for the duration of the COVID-19 declaration under Section 564(b)(1) of the Act, 21 U.S.C. section 360bbb-3(b)(1), unless the authorization is terminated or revoked.  Performed at [facility], [address], [place], [place] [address]        Studies: No results found.  Scheduled Meds:  apixaban  5 mg Oral BID   aspirin  81 mg Oral Daily   atorvastatin  40 mg Oral Daily   furosemide  40 mg Intravenous BID   insulin aspart  0-6 Units Subcutaneous TID WC   Ipratropium-Albuterol  1 puff Inhalation Q6H WA   linagliptin  5 mg Oral Daily   losartan  25 mg Oral Daily   methylPREDNISolone (SOLU-MEDROL) injection  30 mg Intravenous BID   Followed by   Derrill Memo ON 06/10/2021] predniSONE  50 mg Oral Daily   metoprolol succinate  100 mg Oral Daily   pantoprazole  40 mg Oral Daily   potassium chloride  20 mEq Oral Daily   torsemide  30 mg Oral Daily    Continuous Infusions:  remdesivir 100 mg in NS 100 mL       LOS: 1 day     Annita Brod, MD [facility]   06/08/2021, 9:14 AM

## 2021-06-09 DIAGNOSIS — I5023 Acute on chronic systolic (congestive) heart failure: Secondary | ICD-10-CM | POA: Diagnosis not present

## 2021-06-09 DIAGNOSIS — J9601 Acute respiratory failure with hypoxia: Secondary | ICD-10-CM | POA: Diagnosis not present

## 2021-06-09 LAB — BASIC METABOLIC PANEL
Anion gap: 11 (ref 5–15)
BUN: 54 mg/dL — ABNORMAL HIGH (ref 8–23)
CO2: 25 mmol/L (ref 22–32)
Calcium: 8.4 mg/dL — ABNORMAL LOW (ref 8.9–10.3)
Chloride: 103 mmol/L (ref 98–111)
Creatinine, Ser: 0.8 mg/dL (ref 0.44–1.00)
GFR, Estimated: >60 mL/min (ref >60)
Glucose, Bld: 111 mg/dL — ABNORMAL HIGH (ref 70–99)
Potassium: 3.3 mmol/L — ABNORMAL LOW (ref 3.5–5.1)
Sodium: 139 mmol/L (ref 135–145)

## 2021-06-09 LAB — HIGH SENSITIVITY CRP
CRP, High Sensitivity: 51.53 mg/L — ABNORMAL HIGH (ref 0.00–3.00)
CRP, High Sensitivity: 40.79 mg/L — ABNORMAL HIGH (ref 0.00–3.00)

## 2021-06-09 LAB — GLUCOSE, CAPILLARY
Glucose-Capillary: 105 mg/dL — ABNORMAL HIGH (ref 70–99)
Glucose-Capillary: 156 mg/dL — ABNORMAL HIGH (ref 70–99)
Glucose-Capillary: 178 mg/dL — ABNORMAL HIGH (ref 70–99)
Glucose-Capillary: 174 mg/dL — ABNORMAL HIGH (ref 70–99)

## 2021-06-09 MED ORDER — IPRATROPIUM-ALBUTEROL 20-100 MCG/ACT IN AERS
1.0000 | INHALATION_SPRAY | Freq: Two times a day (BID) | RESPIRATORY_TRACT | Status: DC
  Administered 2021-06-09 – 2021-06-11 (×5): 1 via RESPIRATORY_TRACT

## 2021-06-09 NOTE — Telephone Encounter (Signed)
Called 2x no answer. Unable to leave a message never gave me the option to pick #2

## 2021-06-09 NOTE — Assessment & Plan Note (Addendum)
-   presented with pulmonary edema, LE edema, SOB; responded well to IV lasix -Resumed on home regimen at discharge

## 2021-06-09 NOTE — Assessment & Plan Note (Signed)
-   Etiology due to above - Continue working with PT while hospitalized.  Stable for discharge home with home health per PT at this time

## 2021-06-09 NOTE — Progress Notes (Signed)
Progress Note    Tara Fields   MEQ:[id number]  DOB: 24-Dec-1927  DOA: 06/06/2021     2 Date of Service: 06/09/2021   Clinical Course Tara Fields is a 87 year old female with PMH sCHF, COPD, AS s/p AVR, persistent afib on Eliquis who presented to the hospital with cough, shortness of breath, weakness.  She had a positive COVID test at home which was confirmed on admission as well.  She was found to be hypoxic and placed on 2 L oxygen on admission. She was started on remdesivir and Solu-Medrol.  Further work-up revealed an elevated BNP and she was also started on IV Lasix.  Assessment and Plan * Acute on chronic systolic CHF (congestive heart failure) (HCC) - presented with pulmonary edema, LE edema, SOB; responded well to IV lasix - continue diuresis - continue monitoring output    COVID-19 virus infection - Continue remdesivir and steroids - Continue trending inflammatory markers -Continue isolation precautions  Acute respiratory failure with hypoxia (HCC) - Considered multifactorial from volume overload and superimposed COVID infection - Wean oxygen as able  Persistent atrial fibrillation (HCC) - Continue Eliquis  GERD (gastroesophageal reflux disease) - Continue Protonix  Generalized weakness - Etiology due to above - Continue working with PT while hospitalized.  Stable for discharge home with home health per PT at this time  Pure hypercholesterolemia - Continue Lipitor     Subjective:  No events overnight.  Ambulating from bathroom to bed when seen this morning with nurse aide.  She was comfortable and states that her breathing has improved since admission.  Objective Vitals:   06/08/21 1420 06/08/21 2132 06/09/21 0536 06/09/21 0645  BP: 140/88 124/79  (!) 148/91  Pulse: 78 (!) 54 84 85  Resp: 18 18 18 18   Temp: 97.8 F (36.6 C) (!) 97.3 F (36.3 C)  97.9 F (36.6 C)  TempSrc: Oral Oral  Oral  SpO2:  99% 100% 99%  Weight:      Height:        67.1 kg  Vital signs were reviewed and unremarkable.   Exam Physical Exam Constitutional:      General: She is not in acute distress.    Appearance: Normal appearance.  HENT:     Head: Normocephalic and atraumatic.     Mouth/Throat:     Mouth: Mucous membranes are moist.  Eyes:     Extraocular Movements: Extraocular movements intact.  Cardiovascular:     Rate and Rhythm: Normal rate.     Heart sounds: Normal heart sounds.  Pulmonary:     Effort: Pulmonary effort is normal. No respiratory distress.     Breath sounds: Normal breath sounds.  Abdominal:     General: Bowel sounds are normal. There is no distension.     Palpations: Abdomen is soft.     Tenderness: There is no abdominal tenderness.  Musculoskeletal:        General: Normal range of motion.     Cervical back: Normal range of motion and neck supple.  Skin:    General: Skin is warm and dry.  Neurological:     General: No focal deficit present.     Mental Status: She is alert.  Psychiatric:        Mood and Affect: Mood normal.        Behavior: Behavior normal.     Labs / Other Information My review of labs, imaging, notes and other tests shows no new significant findings.    Disposition Plan: Status  is: Inpatient  Remains inpatient appropriate because: Ongoing treatment with  remdesivir and weaning of oxygen   Time spent: Greater than 50% of the 35 minute visit was spent in counseling/coordination of care for the patient as laid out in the A&P.  Dwyane Dee, MD [facility] 06/09/2021, 2:14 PM

## 2021-06-09 NOTE — Assessment & Plan Note (Signed)
-  Continue Lipitor °

## 2021-06-09 NOTE — Assessment & Plan Note (Addendum)
-   Completed remdesivir course and continued on steroid at discharge to complete course

## 2021-06-09 NOTE — Assessment & Plan Note (Signed)
-   Continue Eliquis 

## 2021-06-09 NOTE — Assessment & Plan Note (Signed)
Continue Protonix °

## 2021-06-09 NOTE — Assessment & Plan Note (Addendum)
-   Considered multifactorial from volume overload and superimposed COVID infection -Weaned to room air prior to discharge and ambulated without any hypoxia

## 2021-06-09 NOTE — Hospital Course (Addendum)
Tara Fields is a 87 year old female with PMH sCHF, COPD, AS s/p AVR, persistent afib on Eliquis who presented to the hospital with cough, shortness of breath, weakness.  She had a positive COVID test at home which was confirmed on admission as well.  She was found to be hypoxic and placed on 2 L oxygen on admission. She was started on remdesivir and Solu-Medrol.  Further work-up revealed an elevated BNP and she was also started on IV Lasix.  See below for further A&P.

## 2021-06-10 DIAGNOSIS — I5023 Acute on chronic systolic (congestive) heart failure: Secondary | ICD-10-CM | POA: Diagnosis not present

## 2021-06-10 DIAGNOSIS — U071 COVID-19: Secondary | ICD-10-CM | POA: Diagnosis not present

## 2021-06-10 DIAGNOSIS — J9601 Acute respiratory failure with hypoxia: Secondary | ICD-10-CM | POA: Diagnosis not present

## 2021-06-10 LAB — CBC WITH DIFFERENTIAL/PLATELET
Abs Immature Granulocytes: 0.04 10*3/uL (ref 0.00–0.07)
Basophils Absolute: 0 10*3/uL (ref 0.0–0.1)
Basophils Relative: 0 %
Eosinophils Absolute: 0 10*3/uL (ref 0.0–0.5)
Eosinophils Relative: 0 %
HCT: 37.7 % (ref 36.0–46.0)
Hemoglobin: 11.9 g/dL — ABNORMAL LOW (ref 12.0–15.0)
Immature Granulocytes: 1 %
Lymphocytes Relative: 8 %
Lymphs Abs: 0.5 10*3/uL — ABNORMAL LOW (ref 0.7–4.0)
MCH: 29 pg (ref 26.0–34.0)
MCHC: 31.6 g/dL (ref 30.0–36.0)
MCV: 91.7 fL (ref 80.0–100.0)
Monocytes Absolute: 0.2 10*3/uL (ref 0.1–1.0)
Monocytes Relative: 3 %
Neutro Abs: 5.7 10*3/uL (ref 1.7–7.7)
Neutrophils Relative %: 88 %
Platelets: 236 10*3/uL (ref 150–400)
RBC: 4.11 MIL/uL (ref 3.87–5.11)
RDW: 14.9 % (ref 11.5–15.5)
WBC: 6.4 10*3/uL (ref 4.0–10.5)
nRBC: 0 % (ref 0.0–0.2)

## 2021-06-10 LAB — COMPREHENSIVE METABOLIC PANEL
ALT: 27 U/L (ref 0–44)
AST: 36 U/L (ref 15–41)
Albumin: 3.4 g/dL — ABNORMAL LOW (ref 3.5–5.0)
Alkaline Phosphatase: 53 U/L (ref 38–126)
Anion gap: 11 (ref 5–15)
BUN: 57 mg/dL — ABNORMAL HIGH (ref 8–23)
CO2: 25 mmol/L (ref 22–32)
Calcium: 8.4 mg/dL — ABNORMAL LOW (ref 8.9–10.3)
Chloride: 98 mmol/L (ref 98–111)
Creatinine, Ser: 0.99 mg/dL (ref 0.44–1.00)
GFR, Estimated: 54 mL/min — ABNORMAL LOW (ref >60)
Glucose, Bld: 200 mg/dL — ABNORMAL HIGH (ref 70–99)
Potassium: 3.8 mmol/L (ref 3.5–5.1)
Sodium: 134 mmol/L — ABNORMAL LOW (ref 135–145)
Total Bilirubin: 0.6 mg/dL (ref 0.3–1.2)
Total Protein: 6.6 g/dL (ref 6.5–8.1)

## 2021-06-10 LAB — HIGH SENSITIVITY CRP: CRP, High Sensitivity: 25.12 mg/L — ABNORMAL HIGH (ref 0.00–3.00)

## 2021-06-10 LAB — GLUCOSE, CAPILLARY
Glucose-Capillary: 162 mg/dL — ABNORMAL HIGH (ref 70–99)
Glucose-Capillary: 154 mg/dL — ABNORMAL HIGH (ref 70–99)
Glucose-Capillary: 186 mg/dL — ABNORMAL HIGH (ref 70–99)
Glucose-Capillary: 226 mg/dL — ABNORMAL HIGH (ref 70–99)

## 2021-06-10 LAB — LACTATE DEHYDROGENASE: LDH: 188 U/L (ref 98–192)

## 2021-06-10 LAB — C-REACTIVE PROTEIN: CRP: 1.4 mg/dL — ABNORMAL HIGH (ref <1.0)

## 2021-06-10 LAB — MAGNESIUM: Magnesium: 2.2 mg/dL (ref 1.7–2.4)

## 2021-06-10 LAB — D-DIMER, QUANTITATIVE: D-Dimer, Quant: 1.1 ug/mL-FEU — ABNORMAL HIGH (ref 0.00–0.50)

## 2021-06-10 NOTE — Progress Notes (Signed)
Progress Note    Tara Fields   GBT:[id number]  DOB: 02/12/1928  DOA: 06/06/2021     3 Date of Service: 06/10/2021   Clinical Course Tara Fields is a 87 year old female with PMH sCHF, COPD, AS s/p AVR, persistent afib on Eliquis who presented to the hospital with cough, shortness of breath, weakness.  She had a positive COVID test at home which was confirmed on admission as well.  She was found to be hypoxic and placed on 2 L oxygen on admission. She was started on remdesivir and Solu-Medrol.  Further work-up revealed an elevated BNP and she was also started on IV Lasix.   Assessment and Plan * Acute on chronic systolic CHF (congestive heart failure) (HCC) - presented with pulmonary edema, LE edema, SOB; responded well to IV lasix - continue diuresis - continue monitoring output    COVID-19 virus infection - Continue remdesivir and steroids - tentative plan is to complete remdesivir on Thus and d/c home  - Continue trending inflammatory markers -Continue isolation precautions  Acute respiratory failure with hypoxia ([place]) - Considered multifactorial from volume overload and superimposed COVID infection - Wean oxygen as able - perform walk test today to see if still O2 dependent   Persistent atrial fibrillation (HCC) - Continue Eliquis  GERD (gastroesophageal reflux disease) - Continue Protonix  Generalized weakness - Etiology due to above - Continue working with PT while hospitalized.  Stable for discharge home with home health per PT at this time  Pure hypercholesterolemia - Continue Lipitor     Subjective:  No events overnight. Resting in bed comfortably. Removed her O2 this am in bed and sats stayed appropriate.  Walk test to be done today.  Tentative d/c home tomorrow.   Objective Vitals:   06/09/21 0645 06/09/21 1442 06/09/21 2110 06/10/21 0600  BP: (!) 148/91 107/64 (!) 109/57 (!) 150/88  Pulse: 85 92 68 88  Resp: 18 16 18 18   Temp: 97.9 F  (36.6 C) 98 F (36.7 C) 98.7 F (37.1 C) (!) 97.4 F (36.3 C)  TempSrc: Oral Oral  Oral  SpO2: 99% 98% 100% 100%  Weight:      Height:       67.1 kg  Vital signs were reviewed and unremarkable.   Exam Physical Exam Constitutional:      General: She is not in acute distress.    Appearance: Normal appearance.  HENT:     Head: Normocephalic and atraumatic.     Mouth/Throat:     Mouth: Mucous membranes are moist.  Eyes:     Extraocular Movements: Extraocular movements intact.  Cardiovascular:     Rate and Rhythm: Normal rate and regular rhythm.     Heart sounds: Normal heart sounds.  Pulmonary:     Effort: Pulmonary effort is normal. No respiratory distress.     Breath sounds: Normal breath sounds.  Abdominal:     General: Bowel sounds are normal. There is no distension.     Palpations: Abdomen is soft.     Tenderness: There is no abdominal tenderness.  Musculoskeletal:        General: Normal range of motion.     Cervical back: Normal range of motion and neck supple.  Skin:    General: Skin is warm and dry.  Neurological:     General: No focal deficit present.     Mental Status: She is alert.  Psychiatric:        Mood and Affect: Mood normal.  Behavior: Behavior normal.     Labs / Other Information My review of labs, imaging, notes and other tests shows no new significant findings.    Disposition Plan: Status is: Inpatient  Remains inpatient appropriate because: ongoing covid treatment   Time spent: Greater than 50% of the 35 minute visit was spent in counseling/coordination of care for the patient as laid out in the A&P.  Dwyane Dee, MD [facility] 06/10/2021, 3:08 PM

## 2021-06-10 NOTE — Care Management Important Message (Signed)
Important Message  Patient Details IM Letter placed in Patients room. Name: Tara Fields MRN: [id number] Date of Birth: [birth year]   Medicare Important Message Given:  Yes     Kerin Salen 06/10/2021, 11:00 AM

## 2021-06-10 NOTE — Progress Notes (Signed)
Ambulated pt in room, O2 read 85% but rose to 95% when sitting. Will try again later today.

## 2021-06-10 NOTE — Progress Notes (Addendum)
Physical Therapy Treatment Patient Details Name: Tara Fields MRN: [id number] DOB: [birth year] Today's Date: 06/10/2021   History of Present Illness 87 year old female admitted on 10/22 after having 2 days of cough and shortness of breath and weakness and positive home COVID test, confirmed in the emergency room. PMH: systolic heart failure, COPD, aortic stenosis status post valve replacement and persistent atrial fibrillation on Eliquis    PT Comments    Pt tolerates BLE strengthening exercises without complaint, able to perform STS reps with BUE assisting and SpO2 99% on RA. Pt ambulates ~100 ft with RW, able to navigate past obstacles in room without LOB or difficulty, taking seated rest break after ~60 ft. Pt conversational while ambulating and no dyspnea noted, on RA with SpO2 97-99%. Pt hopeful to d/c home to [facility] tomorrow- states the facility will provide meals while she quarantines and she is motivated to get back to her active lifestyle. Will continue to progress as able.   Recommendations for follow up therapy are one component of a multi-disciplinary discharge planning process, led by the attending physician.  Recommendations may be updated based on patient status, additional functional criteria and insurance authorization.  Follow Up Recommendations  Home health PT     Assistance Recommended at Discharge PRN  Equipment Recommendations  None recommended by PT    Recommendations for Other Services       Precautions / Restrictions Precautions Precautions: Fall Restrictions Weight Bearing Restrictions: No     Mobility  Bed Mobility Overal bed mobility: Modified Independent  General bed mobility comments: bed flat, no assist or cues    Transfers Overall transfer level: Modified independent Equipment used: Rolling walker (2 wheels) Transfers: Sit to/from [employer] Transfers Sit to Stand: Modified independent (Device/Increase time) Stand pivot transfers:  Modified independent (Device/Increase time)  General transfer comment: modified Ind STS and stand pivot, BUE assisting to power up, good steadiness upon rising, BLE not braced against furniture    Ambulation/Gait Ambulation/Gait assistance: Supervision Gait Distance (Feet): 100 Feet (seated rest break at ~60 ft) Assistive device: Rolling walker (2 wheels) Gait Pattern/deviations: Step-through pattern;Decreased stride length;Trunk flexed Gait velocity: decreased   General Gait Details: pt ambualtes laps around room completing turns and navigating past obstacles, no LOB   Stairs             Wheelchair Mobility    Modified Rankin (Stroke Patients Only)       Balance Overall balance assessment: No apparent balance deficits (not formally assessed)       Cognition Arousal/Alertness: Awake/alert Behavior During Therapy: WFL for tasks assessed/performed Overall Cognitive Status: Within Functional Limits for tasks assessed     Exercises General Exercises - Lower Extremity Long Arc Quad: Seated;AROM;Strengthening;Both;15 reps Hip ABduction/ADduction: Seated;AROM;Strengthening;Both;15 reps Hip Flexion/Marching: Seated;AROM;Strengthening;Both;15 reps Other Exercises Other Exercises: STS, 5 reps from recliner    General Comments        Pertinent Vitals/Pain Pain Assessment: No/denies pain    Home Living                          Prior Function            PT Goals (current goals can now be found in the care plan section) Acute Rehab PT Goals Patient Stated Goal: home PT Goal Formulation: With patient Time For Goal Achievement: 06/22/21 Potential to Achieve Goals: Good Progress towards PT goals: Progressing toward goals    Frequency    Min 3X/week  PT Plan Current plan remains appropriate    Co-evaluation              AM-PAC PT "6 Clicks" Mobility   Outcome Measure  Help needed turning from your back to your side while in a flat  bed without using bedrails?: None Help needed moving from lying on your back to sitting on the side of a flat bed without using bedrails?: None Help needed moving to and from a bed to a chair (including a wheelchair)?: None Help needed standing up from a chair using your arms (e.g., wheelchair or bedside chair)?: None Help needed to walk in hospital room?: A Little Help needed climbing 3-5 steps with a railing? : A Little 6 Click Score: 22    End of Session   Activity Tolerance: Patient tolerated treatment well Patient left: in bed;with call bell/phone within reach Nurse Communication: Mobility status PT Visit Diagnosis: Other abnormalities of gait and mobility (R26.89);Difficulty in walking, not elsewhere classified (R26.2)     Time: 1339-1410 PT Time Calculation (min) (ACUTE ONLY): 31 min  Charges:  $Gait Training: 8-22 mins $Therapeutic Exercise: 8-22 mins                      Tori Delonte Musich PT, DPT 06/10/21, 2:24 PM

## 2021-06-11 DIAGNOSIS — U071 COVID-19: Secondary | ICD-10-CM | POA: Diagnosis not present

## 2021-06-11 DIAGNOSIS — I5023 Acute on chronic systolic (congestive) heart failure: Secondary | ICD-10-CM | POA: Diagnosis not present

## 2021-06-11 DIAGNOSIS — J9601 Acute respiratory failure with hypoxia: Secondary | ICD-10-CM | POA: Diagnosis not present

## 2021-06-11 LAB — GLUCOSE, CAPILLARY
Glucose-Capillary: 124 mg/dL — ABNORMAL HIGH (ref 70–99)
Glucose-Capillary: 121 mg/dL — ABNORMAL HIGH (ref 70–99)
Glucose-Capillary: 171 mg/dL — ABNORMAL HIGH (ref 70–99)
Glucose-Capillary: 197 mg/dL — ABNORMAL HIGH (ref 70–99)

## 2021-06-11 LAB — CBC WITH DIFFERENTIAL/PLATELET
Abs Immature Granulocytes: 0.05 10*3/uL (ref 0.00–0.07)
Basophils Absolute: 0 10*3/uL (ref 0.0–0.1)
Basophils Relative: 0 %
Eosinophils Absolute: 0 10*3/uL (ref 0.0–0.5)
Eosinophils Relative: 0 %
HCT: 40.2 % (ref 36.0–46.0)
Hemoglobin: 12.9 g/dL (ref 12.0–15.0)
Immature Granulocytes: 1 %
Lymphocytes Relative: 12 %
Lymphs Abs: 1.1 10*3/uL (ref 0.7–4.0)
MCH: 28.9 pg (ref 26.0–34.0)
MCHC: 32.1 g/dL (ref 30.0–36.0)
MCV: 90.1 fL (ref 80.0–100.0)
Monocytes Absolute: 0.9 10*3/uL (ref 0.1–1.0)
Monocytes Relative: 10 %
Neutro Abs: 6.9 10*3/uL (ref 1.7–7.7)
Neutrophils Relative %: 77 %
Platelets: 268 10*3/uL (ref 150–400)
RBC: 4.46 MIL/uL (ref 3.87–5.11)
RDW: 14.4 % (ref 11.5–15.5)
WBC: 8.9 10*3/uL (ref 4.0–10.5)
nRBC: 0 % (ref 0.0–0.2)

## 2021-06-11 LAB — COMPREHENSIVE METABOLIC PANEL
ALT: 28 U/L (ref 0–44)
AST: 32 U/L (ref 15–41)
Albumin: 3.6 g/dL (ref 3.5–5.0)
Alkaline Phosphatase: 47 U/L (ref 38–126)
Anion gap: 9 (ref 5–15)
BUN: 45 mg/dL — ABNORMAL HIGH (ref 8–23)
CO2: 27 mmol/L (ref 22–32)
Calcium: 8.9 mg/dL (ref 8.9–10.3)
Chloride: 101 mmol/L (ref 98–111)
Creatinine, Ser: 0.81 mg/dL (ref 0.44–1.00)
GFR, Estimated: >60 mL/min (ref >60)
Glucose, Bld: 130 mg/dL — ABNORMAL HIGH (ref 70–99)
Potassium: 4.1 mmol/L (ref 3.5–5.1)
Sodium: 137 mmol/L (ref 135–145)
Total Bilirubin: 0.8 mg/dL (ref 0.3–1.2)
Total Protein: 6.7 g/dL (ref 6.5–8.1)

## 2021-06-11 LAB — LACTATE DEHYDROGENASE: LDH: 212 U/L — ABNORMAL HIGH (ref 98–192)

## 2021-06-11 LAB — C-REACTIVE PROTEIN: CRP: 0.9 mg/dL (ref <1.0)

## 2021-06-11 LAB — D-DIMER, QUANTITATIVE: D-Dimer, Quant: 1.14 ug/mL-FEU — ABNORMAL HIGH (ref 0.00–0.50)

## 2021-06-11 LAB — MAGNESIUM: Magnesium: 2.2 mg/dL (ref 1.7–2.4)

## 2021-06-11 MED ORDER — DEXAMETHASONE 6 MG PO TABS: 6.0000 mg | ORAL_TABLET | Freq: Every day | ORAL | 0 refills | Status: AC

## 2021-06-11 NOTE — Discharge Summary (Addendum)
Physician Discharge Summary   Patient name: Tara Fields  Admit date:     06/06/2021  Discharge date: 06/11/2021  Discharge Physician: Dwyane Dee   PCP: [name], NP   Recommendations at discharge: Continue routine care  Discharge Diagnoses Principal Problem:   Acute on chronic systolic CHF (congestive heart failure) ([place]) Active Problems:   COVID-19 virus infection   Persistent atrial fibrillation ([place])   Pure hypercholesterolemia   Generalized weakness   GERD (gastroesophageal reflux disease)   Resolved Diagnoses Resolved Problems:   Acute respiratory failure with hypoxia [facility])   Hospital Course   Ms. Slagter is a 87 year old female with PMH sCHF, COPD, AS s/p AVR, persistent afib on Eliquis who presented to the hospital with cough, shortness of breath, weakness.  She had a positive COVID test at home which was confirmed on admission as well.  She was found to be hypoxic and placed on 2 L oxygen on admission. She was started on remdesivir and Solu-Medrol.  Further work-up revealed an elevated BNP and she was also started on IV Lasix.  See below for further A&P.    * Acute on chronic systolic CHF (congestive heart failure) (HCC) - presented with pulmonary edema, LE edema, SOB; responded well to IV lasix -Resumed on home regimen at discharge   COVID-19 virus infection - Completed remdesivir course and continued on steroid at discharge to complete course  Acute respiratory failure with hypoxia (HCC)-resolved as of 06/11/2021 - Considered multifactorial from volume overload and superimposed COVID infection -Weaned to room air prior to discharge and ambulated without any hypoxia  Persistent atrial fibrillation (HCC) - Continue Eliquis  GERD (gastroesophageal reflux disease) - Continue Protonix  Generalized weakness - Etiology due to above - Continue working with PT while hospitalized.  Stable for discharge home with home health per PT at this  time  Pure hypercholesterolemia - Continue Lipitor      Condition at discharge: stable  Exam Physical Exam Constitutional:      General: She is not in acute distress.    Appearance: Normal appearance.  HENT:     Head: Normocephalic and atraumatic.     Mouth/Throat:     Mouth: Mucous membranes are moist.  Eyes:     Extraocular Movements: Extraocular movements intact.  Cardiovascular:     Rate and Rhythm: Normal rate and regular rhythm.  Pulmonary:     Effort: Pulmonary effort is normal. No respiratory distress.     Breath sounds: Normal breath sounds.  Abdominal:     General: Bowel sounds are normal. There is no distension.     Palpations: Abdomen is soft.     Tenderness: There is no abdominal tenderness.  Musculoskeletal:        General: No swelling. Normal range of motion.     Cervical back: Normal range of motion and neck supple.  Skin:    General: Skin is warm and dry.  Neurological:     General: No focal deficit present.     Mental Status: She is alert.  Psychiatric:        Mood and Affect: Mood normal.        Behavior: Behavior normal.     Disposition: Home  Discharge time: greater than 30 minutes.  Follow-up Information     [facility].   Specialty: Home Health Services Why: PT, RN                Allergies as of 06/11/2021  Reactions   Amlodipine Besy-benazepril Hcl Cough   Hctz [hydrochlorothiazide] Other (See Comments)   Per family, every diuretic the patient has been on flushes out her out and ultimately results in CRAMPING/JAUNDICE (added potassium might help)   Other Other (See Comments)   History of diverticulitis- CANNOT TOLERATE NUTS, CORN, AND ANY FOODS NOT EASILY DIGESTED- the patient AVOIDS these   Cyclobenzaprine Other (See Comments)   Confusion- does not tolerate well    Diclofenac Sodium Diarrhea   Hydralazine Other (See Comments)   Per family, every diuretic the patient has been on flushes  out her out and ultimately results in CRAMPING/JAUNDICE (added potassium might help)   Methylprednisolone Other (See Comments)   Medrol/CORTICOSTEROIDS = A LOT OF ENERGY   Oxycodone Other (See Comments)   Caused patient to "act crazy"        Medication List     STOP taking these medications    pantoprazole 40 MG tablet Commonly known as: Protonix   senna-docusate 8.6-50 MG tablet Commonly known as: Senokot-S       TAKE these medications    acetaminophen 500 MG tablet Commonly known as: TYLENOL Take 500 mg by mouth 3 (three) times daily as needed for headache (pain). Take with tramadol   atorvastatin 40 MG tablet Commonly known as: LIPITOR TAKE 1 TABLET BY MOUTH DAILY AT 6 PM. What changed: when to take this   cholecalciferol 25 MCG (1000 UNIT) tablet Commonly known as: VITAMIN D3 Take 1,000 Units by mouth 2 (two) times daily.   Co Q-10 300 MG Caps Take 300 mg by mouth in the morning.   [employer] Aspirin Adult Low Dose 81 MG chewable tablet Generic drug: aspirin CHEW 1 TABLET (81 MG TOTAL) BY MOUTH DAILY. What changed: how much to take   dexamethasone 6 MG tablet Commonly known as: Decadron Take 1 tablet (6 mg total) by mouth daily for 5 days.   Eliquis 5 MG Tabs tablet Generic drug: apixaban TAKE 1 TABLET BY MOUTH TWICE A DAY What changed: how much to take   ferrous gluconate 324 MG tablet Commonly known as: FERGON TAKE 1 TABLET BY MOUTH DAILY WITH BREAKFAST   HM LIDOCAINE PATCH EX Apply 1 patch topically daily as needed (for back pain).   Lidocaine-Menthol (Spray) 4-1 % Liqd Apply 1 spray topically daily as needed (back/knee pain/restless legs).   losartan 25 MG tablet Commonly known as: COZAAR Take 1 tablet (25 mg total) by mouth daily.   Magnesium 200 MG Tabs Take 200 mg by mouth every morning.   metoprolol succinate 100 MG 24 hr tablet Commonly known as: TOPROL-XL Take 1 tablet (100 mg total) by mouth daily. Take with or immediately following a  meal.   multivitamin with minerals Tabs tablet Take 1 tablet by mouth 2 (two) times daily. Centrum Minis   potassium chloride 20 MEQ/15ML (10%) Soln Take 15 mLs (20 mEq total) by mouth daily.   sodium chloride 0.65 % Soln nasal spray Commonly known as: OCEAN Place 1 spray into both nostrils daily as needed for congestion.   terazosin 10 MG capsule Commonly known as: HYTRIN Take 1 capsule (10 mg total) by mouth daily at 12 noon.   torsemide 10 MG tablet Commonly known as: Demadex Take 3 tablets (30 mg total) by mouth daily.   traMADol 50 MG tablet Commonly known as: ULTRAM Take 50 mg by mouth every 6 (six) hours as needed for severe pain.   vitamin B-12 1000 MCG tablet Commonly known as:  CYANOCOBALAMIN Take 1 tablet (1,000 mcg total) by mouth daily.        DG Chest Portable 1 View  Result Date: 06/06/2021 CLINICAL DATA:  COVID. EXAM: PORTABLE CHEST 1 VIEW COMPARISON:  May 05, 2021 FINDINGS: Diffuse, chronic appearing increased interstitial lung markings are seen. Mild, stable left suprahilar atelectasis and/or infiltrate is noted. There is no evidence of a pleural effusion or pneumothorax. The cardiac silhouette is moderately enlarged. An artificial aortic valve is seen. There is marked severity calcification of the aortic arch. Degenerative changes are seen throughout the thoracic spine. IMPRESSION: Chronic appearing increased interstitial lung markings with mild, stable left suprahilar atelectasis and/or infiltrate. Electronically Signed   By: Virgina Norfolk M.D.   On: 06/06/2021 23:58   Results for orders placed or performed during the hospital encounter of 06/06/21  Resp Panel by RT-PCR (Flu A&B, Covid) Nasopharyngeal Swab     Status: Abnormal   Collection Time: 06/07/21  1:51 AM   Specimen: Nasopharyngeal Swab; Nasopharyngeal(NP) swabs in vial transport medium  Result Value Ref Range Status   SARS Coronavirus 2 by RT PCR POSITIVE (A) NEGATIVE Final    Comment:  RESULT CALLED TO, READ BACK BY AND VERIFIED WITH: KATA,Z AT 6244 ON 06/07/2021 BY MOSLEY,J (NOTE) SARS-CoV-2 target nucleic acids are DETECTED.  The SARS-CoV-2 RNA is generally detectable in upper respiratory specimens during the acute phase of infection. Positive results are indicative of the presence of the identified virus, but do not rule out bacterial infection or co-infection with other pathogens not detected by the test. Clinical correlation with patient history and other diagnostic information is necessary to determine patient infection status. The expected result is Negative.  Fact Sheet for Patients: EntrepreneurPulse.com.au  Fact Sheet for Healthcare Providers: IncredibleEmployment.be  This test is not yet approved or cleared by the [place] FDA and  has been authorized for detection and/or diagnosis of SARS-CoV-2 by FDA under an Emergency Use Authorization (EUA).  This EUA will remain in effect (meaning this test  can be used) for the duration of  the COVID-19 declaration under Section 564(b)(1) of the Act, 21 U.S.C. section 360bbb-3(b)(1), unless the authorization is terminated or revoked sooner.     Influenza A by PCR NEGATIVE NEGATIVE Final   Influenza B by PCR NEGATIVE NEGATIVE Final    Comment: (NOTE) The Xpert Xpress SARS-CoV-2/FLU/RSV plus assay is intended as an aid in the diagnosis of influenza from Nasopharyngeal swab specimens and should not be used as a sole basis for treatment. Nasal washings and aspirates are unacceptable for Xpert Xpress SARS-CoV-2/FLU/RSV testing.  Fact Sheet for Patients: EntrepreneurPulse.com.au  Fact Sheet for Healthcare Providers: IncredibleEmployment.be  This test is not yet approved or cleared by the [place] FDA and has been authorized for detection and/or diagnosis of SARS-CoV-2 by FDA under an Emergency Use Authorization (EUA). This EUA  will remain in effect (meaning this test can be used) for the duration of the COVID-19 declaration under Section 564(b)(1) of the Act, 21 U.S.C. section 360bbb-3(b)(1), unless the authorization is terminated or revoked.  Performed at [facility], [address] [place], [place] [address]     Signed:  Dwyane Dee MD.  [facility] 06/11/2021, 4:35 PM

## 2021-06-11 NOTE — Plan of Care (Signed)

## 2021-06-11 NOTE — TOC Transition Note (Signed)
Transition of Care [facility]) - CM/SW Discharge Note  Patient Details  Name: Tara Fields MRN: [id number] Date of Birth: January 17, 1928  Transition of Care [facility]) CM/SW Contact:  Sherie Don, LCSW Phone Number: 06/11/2021, 12:49 PM  Clinical Narrative: Patient will discharge back to [facility] independent living facility today. Patient will not require O2. CSW called [place] [phone]) and notified [facility] of patient's discharge. CSW updated Ramond Marrow with [facility] and Rollene Fare with [facility] that patient will discharge today. Rollene Fare is aware patient is active with [facility] for [facility]. CSW confirmed with son that he will transport the patient. TOC signing off.  Final next level of care: [place] Barriers to Discharge: Barriers Resolved  Patient Goals and CMS Choice Patient states their goals for this hospitalization and ongoing recovery are:: Return home to [place] independent living [employer].gov Compare Post Acute Care list provided to:: Patient Represenative (must comment) Choice offered to / list presented to : [facility] POA / Guardian  Discharge Plan and Services In-house Referral: Clinical Social Work Discharge Planning Services: NA Post Acute Care Choice: Home Health          DME Arranged: N/A DME Agency: NA HH Arranged: PT, RN [place] Agency: [place] ([facility]) Date HH Agency Contacted: 06/08/21 Time [place]: 1138 Representative spoke with at [place]: [place]  Readmission Risk Interventions Readmission Risk Prevention Plan 06/08/2021 05/07/2021 04/16/2021  Transportation Screening Complete Complete Complete  PCP or Specialist Appt within 3-5 Days - - Complete  HRI or [place] - - Complete  Palliative Care Screening - - Not Applicable  Medication Review (RN Care Manager) Complete Complete -  PCP or Specialist appointment within 3-5 days of discharge - Complete -  [place] or Home Care Consult Complete Complete -  SW  Recovery Care/Counseling Consult Complete - -  Palliative Care Screening Not Applicable Not Applicable -  [place] Not Applicable Not Applicable -  Some recent data might be hidden

## 2021-06-11 NOTE — Progress Notes (Signed)
SATURATION QUALIFICATIONS: (This note is used to comply with regulatory documentation for home oxygen)  Patient Saturations on Room Air at Rest = 98%  Patient Saturations on Room Air while Ambulating = 98%

## 2021-06-11 NOTE — Progress Notes (Signed)
Pt oxygen saturation remain in high 90s (95-98%) while ambulating and on room air. Pt tolerated well.

## 2021-06-12 ENCOUNTER — Telehealth: Payer: Self-pay

## 2021-06-12 NOTE — Telephone Encounter (Signed)
Called 3x still no answer and no option to press 2. Pt was later admitted for care not sure if the call was related to that. Closing note

## 2021-06-12 NOTE — Telephone Encounter (Signed)
Called number 2x and it stated " cannot fulfil your request'. Will try again tomorrow.

## 2021-06-12 NOTE — Telephone Encounter (Signed)
Okay for verbal orders? Please advise 

## 2021-06-12 NOTE — Telephone Encounter (Signed)
[facility] home health nurse call asking for verbal orders for plan of care for patient  Frequency 2 x 1 week 2 x 2 week 2 x 4 week  Call back # [phone]

## 2021-06-16 ENCOUNTER — Telehealth: Payer: Self-pay | Admitting: Adult Health

## 2021-06-16 NOTE — Telephone Encounter (Signed)
Disregard

## 2021-06-16 NOTE — Progress Notes (Signed)
Cardiology Office Note    Date:  06/23/2021   ID:  Tara Fields, DOB 1928/02/13, MRN [id number]   PCP:  [name], NP   [place]  Cardiologist:  Lauree Chandler, MD   Advanced Practice Provider:  No care team member to display Electrophysiologist:  None   [phone]   Chief Complaint  Patient presents with   Hospitalization Follow-up    History of Present Illness:  Tara Fields is a 87 y.o. female with history of severe AS s/p TAVR at [place] 03/07/2019, She developed a pseudoaneurysm post TAVR and had thrombin injection. She has atrial fibrillation and is on Eliquis. She has carotid artery disease. She says that no stent was placed in her carotid artery. Cardiac cath March 2019 at [place] with 25% mid LAD stenosis, 50% stenosis second diagonal branch, 50% distal Circumflex stenosis, 75% ostial stenosis of a small Diagonal branch. Echo September 2020 at [place] with LVEF=45-50%. Moderate MR. Bioprosthetic aortic valve working well. Repeat echo at [place] July 2021 with no change, Trivial perivalvular leak noted. She was admitted to [facility] 12/19/20 with acute systolic CHF. Echo 12/20/20 with LVEF=25-30%. No ischemic evaluation was planned per discussion with Dr. Percival Spanish and the patient. She was diuresed with IV Lasix. Norvasc was stopped. She was seen in the ED 01/04/21 with c/o dyspnea. BNP was elevated at 1852 but overall unchanged over last few checks over the last few months. Troponin negative. Lasix increased to 20 mg po BID. COPD, CAD, GERD, persistent atrial fibrillation, DM, HTN, HLD, tobacco abuse and diverticulitis    Patient discharged 01/31/2021 after admission for CHF treated with IV Lasix.     I saw the patient 04/01/2021 when she was doing well on low-dose Lasix.  Unfortunately she was readmitted 05/05/2021 with recurrent CHF and hypoxia to the 80s.  BNP was 2600 she diuresed with IV Lasix and she was discharged home on torsemide creatinine 1.01 at  discharge.  Echo was not repeated.  I saw the patient 05/19/2021 and CHF was stable.  Patient discharged from the hospital 06/11/2021 with diagnosis of acute respiratory failure with hypoxia in the setting of COVID positive.  She was treated with O2, remdesivir and Solu-Medrol.  She also was given IV Lasix with an elevated BNP.  Creatinine 0.81 at discharge.  Patient comes in with her son. Weight has trended up since hospital and taking torsemide 40/K 20 meq daily based on weight. She's feeling pretty good-just weak.  Past Medical History:  Diagnosis Date   Aortic stenosis    s/p TAVR 26 mm Edwards Sapien 3 THV July 2020   CAD (coronary artery disease)    COPD (chronic obstructive pulmonary disease) (HCC)    Diabetes mellitus (HCC)    Diverticulitis    GERD (gastroesophageal reflux disease)    HTN (hypertension)    Hyperlipidemia    Legally blind in left eye, as defined in [place]    Tobacco abuse     Past Surgical History:  Procedure Laterality Date   ABDOMINAL HYSTERECTOMY     APPENDECTOMY     BACK SURGERY     CATARACT EXTRACTION, BILATERAL     CHOLECYSTECTOMY     COLONOSCOPY WITH PROPOFOL N/A 04/16/2021   Procedure: COLONOSCOPY WITH PROPOFOL;  Surgeon: Milus Banister, MD;  Location: [facility];  Service: Gastroenterology;  Laterality: N/A;   ESOPHAGOGASTRODUODENOSCOPY (EGD) WITH PROPOFOL N/A 04/15/2021   Procedure: ESOPHAGOGASTRODUODENOSCOPY (EGD) WITH PROPOFOL;  Surgeon: Doran Stabler, MD;  Location: [facility];  Service: Gastroenterology;  Laterality: N/A;    Current Medications: Current Meds  Medication Sig   acetaminophen (TYLENOL) 500 MG tablet Take 500 mg by mouth 3 (three) times daily as needed for headache (pain). Take with tramadol   albuterol (VENTOLIN HFA) 108 (90 Base) MCG/ACT inhaler Inhale 2 puffs into the lungs every 6 (six) hours as needed for wheezing or shortness of breath.   atorvastatin (LIPITOR) 40 MG tablet TAKE 1 TABLET BY MOUTH DAILY AT 6 PM.  (Patient taking differently: Take 40 mg by mouth daily.)   cholecalciferol (VITAMIN D3) 25 MCG (1000 UNIT) tablet Take 1,000 Units by mouth 2 (two) times daily.   Coenzyme Q10 (CO Q-10) 300 MG CAPS Take 300 mg by mouth in the morning.   [employer] ASPIRIN ADULT LOW DOSE 81 MG chewable tablet CHEW 1 TABLET (81 MG TOTAL) BY MOUTH DAILY. (Patient taking differently: Chew 81 mg by mouth daily.)   ELIQUIS 5 MG TABS tablet TAKE 1 TABLET BY MOUTH TWICE A DAY (Patient taking differently: Take 5 mg by mouth 2 (two) times daily.)   ferrous gluconate (FERGON) 324 MG tablet TAKE 1 TABLET BY MOUTH DAILY WITH BREAKFAST (Patient taking differently: Take 324 mg by mouth daily with breakfast.)   HM LIDOCAINE PATCH EX Apply 1 patch topically daily as needed (for back pain).   Lidocaine-Menthol, Spray, 4-1 % LIQD Apply 1 spray topically daily as needed (back/knee pain/restless legs).   losartan (COZAAR) 25 MG tablet Take 1 tablet (25 mg total) by mouth daily.   Magnesium 200 MG TABS Take 200 mg by mouth every morning.   Multiple Vitamin (MULTIVITAMIN WITH MINERALS) TABS tablet Take 1 tablet by mouth 2 (two) times daily. Centrum Minis   potassium chloride 20 MEQ/15ML (10%) SOLN Take 15 mLs (20 mEq total) by mouth daily.   sodium chloride (OCEAN) 0.65 % SOLN nasal spray Place 1 spray into both nostrils daily as needed for congestion.   Spacer/Aero-Holding Dorise Bullion Use with inhaler   terazosin (HYTRIN) 10 MG capsule Take 1 capsule (10 mg total) by mouth daily at 12 noon.   torsemide (DEMADEX) 20 MG tablet Take 2 tablets (40 mg total) by mouth daily.   traMADol (ULTRAM) 50 MG tablet Take 50 mg by mouth every 6 (six) hours as needed for severe pain.   vitamin B-12 (CYANOCOBALAMIN) 1000 MCG tablet Take 1 tablet (1,000 mcg total) by mouth daily.   [DISCONTINUED] torsemide (DEMADEX) 10 MG tablet Take 3 tablets (30 mg total) by mouth daily.     Allergies:   Amlodipine besy-benazepril hcl, Hctz [hydrochlorothiazide], Other,  Cyclobenzaprine, Diclofenac sodium, Hydralazine, Methylprednisolone, and Oxycodone   Social History   Socioeconomic History   Marital status: Widowed    Spouse name: Not on file   Number of children: 2   Years of education: Not on file   Highest education level: High school graduate  Occupational History   Occupation: Retired    Comment: homemaker  Tobacco Use   Smoking status: Former    Packs/day: 1.00    Years: 70.00    Pack years: 70.00    Types: Cigarettes    Quit date: 12/15/2019    Years since quitting: 1.5   Smokeless tobacco: Never   Tobacco comments:    Pt states quiting a year ago. 01/03/20 ARJ  Vaping Use   Vaping Use: Never used  Substance and Sexual Activity   Alcohol use: No    Alcohol/week: 0.0 standard drinks   Drug use:  No   Sexual activity: Not Currently  Other Topics Concern   Not on file  Social History Narrative   Widowed- was married for 40 years    Has two sons       Social Determinants of Radio broadcast assistant Strain: Low Risk    Difficulty of Paying Living Expenses: Not hard at all  Food Insecurity: No Food Insecurity   Worried About Charity fundraiser in the Last Year: Never true   Arboriculturist in the Last Year: Never true  Transportation Needs: No Transportation Needs   Lack of Transportation (Medical): No   Lack of Transportation (Non-Medical): No  Physical Activity: Insufficiently Active   Days of Exercise per Week: 5 days   Minutes of Exercise per Session: 10 min  Stress: No Stress Concern Present   Feeling of Stress : Not at all  Social Connections: Socially Isolated   Frequency of Communication with Friends and Family: Three times a week   Frequency of Social Gatherings with Friends and Family: Three times a week   Attends Religious Services: Never   Active Member of Clubs or Organizations: No   Attends Archivist Meetings: Never   Marital Status: Widowed     Family History:  The patient's  family history  includes Cerebral palsy in her brother; Diabetes in her mother; Stroke in her brother.   ROS:   Please see the history of present illness.    ROS All other systems reviewed and are negative.   PHYSICAL EXAM:   VS:  BP 110/60   Pulse 76   Ht 5' (1.524 m)   SpO2 95%   BMI 27.93 kg/m   Physical Exam  GEN: Well nourished, well developed, in no acute distress  Neck: no JVD, carotid bruits, or masses Cardiac:RRR; 1/6 systolic murmu Respiratory:  clear to auscultation bilaterally, normal work of breathing GI: soft, nontender, nondistended, + BS Ext: trace edema left greater than right,without cyanosis, clubbing,  Good distal pulses bilaterally Neuro:  Alert and Oriented x 3 Psych: euthymic mood, full affect  Wt Readings from Last 3 Encounters:  06/19/21 143 lb (64.9 kg)  06/08/21 148 lb (67.1 kg)  05/19/21 144 lb 3.2 oz (65.4 kg)      Studies/Labs Reviewed:   EKG:  EKG is not ordered today.     Recent Labs: 06/07/2021: B Natriuretic Peptide 3,284.2; TSH 0.692 06/11/2021: ALT 28; BUN 45; Creatinine, Ser 0.81; Hemoglobin 12.9; Magnesium 2.2; Platelets 268; Potassium 4.1; Sodium 137   Lipid Panel    Component Value Date/Time   CHOL 94 05/08/2021 0234   TRIG 57 05/08/2021 0234   HDL 34 (L) 05/08/2021 0234   CHOLHDL 2.8 05/08/2021 0234   VLDL 11 05/08/2021 0234   LDLCALC 49 05/08/2021 0234    Additional studies/ records that were reviewed today include:  2D echo 12/20/2020 IMPRESSIONS     1. There is prominent septal-lateral left ventricular dyssynchrony due to  LBBB. Left ventricular ejection fraction, by estimation, is 25 to 30%. The  left ventricle has severely decreased function. The left ventricle  demonstrates global hypokinesis. The  left ventricular internal cavity size was mildly dilated. Left ventricular  diastolic function could not be evaluated.   2. Right ventricular systolic function is normal. The right ventricular  size is normal. There is normal  pulmonary artery systolic pressure.   3. Left atrial size was severely dilated.   4. The mitral valve is normal in structure.  Mild mitral valve  regurgitation. No evidence of mitral stenosis.   5. Mild perivalvular leak. The aortic valve has been repaired/replaced.  Aortic valve regurgitation is mild. There is a 26 mm Sapien prosthetic  (TAVR) valve present in the aortic position. Procedure Date: 03/07/2019 at  [facility]. Aortic regurgitation  PHT measures 401 msec. Aortic valve mean gradient measures 12.2 mmHg.  Aortic valve Vmax measures 2.46 m/s. Aortic valve acceleration time  measures 76 msec.   6. The inferior vena cava is normal in size with greater than 50%  respiratory variability, suggesting right atrial pressure of 3 mmHg.   Comparison(s): A prior study was performed on 03/25/2020. Prior images  unable to be directly viewed, comparison made by report only. The left  ventricular function is significantly worse. 03/25/2020 study at [place] reports LVEF 45-50%,  similar TAVR gradients and mild perivalvular leak. LBBB is not mentioned  on that report.      Risk Assessment/Calculations:    CHA2DS2-VASc Score = 7   This indicates a 11.2% annual risk of stroke. The patient's score is based upon: CHF History: 1 HTN History: 1 Diabetes History: 1 Stroke History: 0 Vascular Disease History: 1 Age Score: 2 Gender Score: 1        ASSESSMENT:    1. Aortic valve disorder   2. Paroxysmal atrial fibrillation (HCC)   3. Chronic systolic CHF (congestive heart failure) (HCC)   4. Stage 3 chronic kidney disease, unspecified whether stage 3a or 3b CKD ([place])   5. Coronary artery disease involving native coronary artery of native heart without angina pectoris   6. Bilateral carotid artery stenosis      PLAN:  In order of problems listed above:  Severe AS status post TAVR at [place] 03/07/2019-still on baby ASA asymptomatic   PAF on Eliquis-no bleeding problems.  doing better on  Vit D92   Chronic systolic CHF LVEF 25 to 42% on echo 12/2020 patient has declined ischemic evaluation.  Readmitted 05/2021 with CHF in the setting of COVID diuresed with IV Lasix. Now on demadex 40/k 20 meq based on weight/edema. Check labs today    CKD Crt 0.81 06/11/21-recheck today   CAD mild to moderate on cath in 2019-no angina   Carotid artery disease followed at [place] with prior right carotid artery stenting    Shared Decision Making/Informed Consent        Medication Adjustments/Labs and Tests Ordered: Current medicines are reviewed at length with the patient today.  Concerns regarding medicines are outlined above.  Medication changes, Labs and Tests ordered today are listed in the Patient Instructions below. Patient Instructions  Medication Instructions:  1.Change torsemide to 20 mg tablets, take 2 tablets (40 mg)  by mouth daily *If you need a refill on your cardiac medications before your next appointment, please call your pharmacy*   Lab Work: BMET today If you have labs (blood work) drawn today and your tests are completely normal, you will receive your results only by: [place] (if you have MyChart) OR A paper copy in the mail If you have any lab test that is abnormal or we need to change your treatment, we will call you to review the results.   Follow-Up: At [facility], you and your health needs are our priority.  As part of our continuing mission to provide you with exceptional heart care, we have created designated Provider Care Teams.  These Care Teams include your primary Cardiologist (physician) and Advanced Practice Providers (  APPs -  Physician Assistants and Nurse Practitioners) who all work together to provide you with the care you need, when you need it.   Your next appointment:   09/16/2021  The format for your next appointment:   In Person  Provider:   Burnell Blanks, MD    Signed, Ermalinda Barrios, PA-C   06/23/2021 9:39 AM    [address], [place], [place]  [id number] Phone: [phone]; Fax: [phone]

## 2021-06-17 ENCOUNTER — Telehealth: Payer: Self-pay

## 2021-06-17 LAB — METHYLMALONIC ACID, SERUM: Methylmalonic Acid, Quantitative: 230 nmol/L (ref 0–378)

## 2021-06-17 NOTE — Telephone Encounter (Signed)
Transition Care Management Follow-up Telephone Call Date of discharge and from where: 06/11/2021 / [facility] How have you been since you were released from the hospital?son states, " still positive for COVID, a little hoarse and tired."   Any questions or concerns? No  Items Reviewed: Did the pt receive and understand the discharge instructions provided? Yes  Medications obtained and verified? Yes  Other? No  Any new allergies since your discharge? No  Dietary orders reviewed? Yes Do you have support at home? Yes   Home Care and Equipment/Supplies: Were home health services ordered? yes If so, what is the name of the agency? [facility]  Has the agency set up a time to come to the patient's home? yes Were any new equipment or medical supplies ordered?  No What is the name of the medical supply agency? N/a Were you able to get the supplies/equipment? not applicable Do you have any questions related to the use of the equipment or supplies? No  Functional Questionnaire: (I = Independent and D = Dependent) ADLs: I  Bathing/Dressing- I  Meal Prep- I ( Meals are delivered)   Eating- I  Maintaining continence- I  Transferring/Ambulation- I  Managing Meds- I ( Patient takes her own medication and son dispenses in pill box)  Follow up appointments reviewed:  PCP Hospital f/u appt confirmed? Yes  Scheduled to see [name], NP on 06/19/2021 @ 9:30am. [facility] f/u appt confirmed? Yes  Scheduled to see Estella Husk, PA on 06/23/2021 @ 8:45am. Are transportation arrangements needed? Yes  If their condition worsens, is the pt aware to call PCP or go to the Emergency Dept.? Yes Was the patient provided with contact information for the PCP's office or ED? Yes Was to pt encouraged to call back with questions or concerns? Yes  Quinn Plowman RN,BSN,CCM RN Case Manager [place]  [phone]

## 2021-06-17 NOTE — Telephone Encounter (Signed)
Not sure if this is the correct number or if something is going on with their phone lines. Still unable to reach [facility]. Will unfortunately have to close note.

## 2021-06-18 ENCOUNTER — Other Ambulatory Visit: Payer: Self-pay

## 2021-06-19 ENCOUNTER — Ambulatory Visit (INDEPENDENT_AMBULATORY_CARE_PROVIDER_SITE_OTHER): Payer: Medicare Other

## 2021-06-19 ENCOUNTER — Ambulatory Visit (INDEPENDENT_AMBULATORY_CARE_PROVIDER_SITE_OTHER): Payer: Medicare Other | Admitting: Adult Health

## 2021-06-19 ENCOUNTER — Encounter: Payer: Self-pay | Admitting: Adult Health

## 2021-06-19 VITALS — BP 120/64 | HR 75 | Temp 98.0°F | Ht 60.0 in | Wt 143.0 lb

## 2021-06-19 DIAGNOSIS — U071 COVID-19: Secondary | ICD-10-CM

## 2021-06-19 DIAGNOSIS — I5023 Acute on chronic systolic (congestive) heart failure: Secondary | ICD-10-CM

## 2021-06-19 DIAGNOSIS — I4819 Other persistent atrial fibrillation: Secondary | ICD-10-CM | POA: Diagnosis not present

## 2021-06-19 DIAGNOSIS — K219 Gastro-esophageal reflux disease without esophagitis: Secondary | ICD-10-CM

## 2021-06-19 DIAGNOSIS — Z8616 Personal history of COVID-19: Secondary | ICD-10-CM

## 2021-06-19 DIAGNOSIS — J9601 Acute respiratory failure with hypoxia: Secondary | ICD-10-CM

## 2021-06-19 DIAGNOSIS — E7800 Pure hypercholesterolemia, unspecified: Secondary | ICD-10-CM

## 2021-06-19 DIAGNOSIS — R531 Weakness: Secondary | ICD-10-CM

## 2021-06-19 MED ORDER — SPACER/AERO-HOLDING CHAMBERS DEVI: 0 refills | Status: DC

## 2021-06-19 MED ORDER — ALBUTEROL SULFATE HFA 108 (90 BASE) MCG/ACT IN AERS: 2.0000 | INHALATION_SPRAY | Freq: Four times a day (QID) | RESPIRATORY_TRACT | 0 refills | Status: DC | PRN

## 2021-06-19 NOTE — Progress Notes (Signed)
Subjective:    Patient ID: Tara Fields, female    DOB: [birth year], 87 y.o.   MRN: [id number]  HPI 87 year old female who  has a past medical history of Aortic stenosis, CAD (coronary artery disease), COPD (chronic obstructive pulmonary disease) ([place]), Diabetes mellitus ([place]), Diverticulitis, GERD (gastroesophageal reflux disease), HTN (hypertension), Hyperlipidemia, Legally blind in left eye, as defined in [place], and Tobacco abuse.  She presents to the office today for TCM visit   Admit Date 06/06/2021 Discharge Date 06/11/2021  She presented to the hospital with cough, shortness of breath, and weakness.  She had a positive COVID test at home which was confirmed on admission as well.  She was found to be hypoxic and placed on 2 L oxygen on admission.  She was started on remdesivir and Solu-Medrol.  Further work-up revealed an elevated BNP and she was also started on IV Lasix  Hospital Course   Acute on chronic systolic CHF -Presented with pulmonary edema, lower extremity edema, SOB; responded well to IV Lasix.  She was resumed on home regimen at discharge  COVID-19 infection - Completed remdesivir course and continued on steroid at discharge to complete course  Acute respiratory failure with hypoxia - Resolved as of 06/11/2021.  Considered multifactorial from volume overload and superimposed COVID infection.  She was weaned to room air prior to discharge and ambulated without any hypoxia  Persistent atrial fibrillation - Continued on Eliquis  GERD - Continued on Protonix  Generalized weakness - Etiology due to above.  Continue working with PT while hospitalized.  She was stable for discharge with home health per PT at this time  Pure hypercholesterolemia - Was continued on Lipitor  Today she reports that she is feeling slightly better since being discharged, continues to have extreme fatigue, shortness of breath, and nasal congestion with clear rhinorrhea.  She has  completed her steroid course outpatient.  Denies fevers or chills.   Review of Systems  Constitutional:  Positive for fatigue.  HENT:  Positive for congestion, postnasal drip and rhinorrhea.   Respiratory:  Positive for shortness of breath and wheezing.   Cardiovascular: Negative.   Gastrointestinal: Negative.   Genitourinary: Negative.   Musculoskeletal:  Positive for arthralgias and gait problem.  Skin: Negative.   Neurological:  Positive for weakness.  Psychiatric/Behavioral: Negative.    All other systems reviewed and are negative.  Past Medical History:  Diagnosis Date   Aortic stenosis    s/p TAVR 26 mm Edwards Sapien 3 THV July 2020   CAD (coronary artery disease)    COPD (chronic obstructive pulmonary disease) (HCC)    Diabetes mellitus (HCC)    Diverticulitis    GERD (gastroesophageal reflux disease)    HTN (hypertension)    Hyperlipidemia    Legally blind in left eye, as defined in [place]    Tobacco abuse     Social History   Socioeconomic History   Marital status: Widowed    Spouse name: Not on file   Number of children: 2   Years of education: Not on file   Highest education level: High school graduate  Occupational History   Occupation: Retired    Comment: homemaker  Tobacco Use   Smoking status: Former    Packs/day: 1.00    Years: 70.00    Pack years: 70.00    Types: Cigarettes    Quit date: 12/15/2019    Years since quitting: 1.5   Smokeless tobacco: Never   Tobacco comments:  Pt states quiting a year ago. 01/03/20 [facility]J  Vaping Use   Vaping Use: Never used  Substance and Sexual Activity   Alcohol use: No    Alcohol/week: 0.0 standard drinks   Drug use: No   Sexual activity: Not Currently  Other Topics Concern   Not on file  Social History Narrative   Widowed- was married for 1 years    Has two sons       Social Determinants of Radio broadcast assistant Strain: Low Risk    Difficulty of Paying Living Expenses: Not hard at all  Food  Insecurity: No Food Insecurity   Worried About Charity fundraiser in the Last Year: Never true   Arboriculturist in the Last Year: Never true  Transportation Needs: No Transportation Needs   Lack of Transportation (Medical): No   Lack of Transportation (Non-Medical): No  Physical Activity: Insufficiently Active   Days of Exercise per Week: 5 days   Minutes of Exercise per Session: 10 min  Stress: No Stress Concern Present   Feeling of Stress : Not at all  Social Connections: Socially Isolated   Frequency of Communication with Friends and Family: Three times a week   Frequency of Social Gatherings with Friends and Family: Three times a week   Attends Religious Services: Never   Active Member of Clubs or Organizations: No   Attends Archivist Meetings: Never   Marital Status: Widowed  Human resources officer Violence: Not At Risk   Fear of Current or Ex-Partner: No   Emotionally Abused: No   Physically Abused: No   Sexually Abused: No    Past Surgical History:  Procedure Laterality Date   ABDOMINAL HYSTERECTOMY     APPENDECTOMY     BACK SURGERY     CATARACT EXTRACTION, BILATERAL     CHOLECYSTECTOMY     COLONOSCOPY WITH PROPOFOL N/A 04/16/2021   Procedure: COLONOSCOPY WITH PROPOFOL;  Surgeon: Milus Banister, MD;  Location: [facility];  Service: Gastroenterology;  Laterality: N/A;   ESOPHAGOGASTRODUODENOSCOPY (EGD) WITH PROPOFOL N/A 04/15/2021   Procedure: ESOPHAGOGASTRODUODENOSCOPY (EGD) WITH PROPOFOL;  Surgeon: Doran Stabler, MD;  Location: [facility];  Service: Gastroenterology;  Laterality: N/A;    Family History  Problem Relation Age of Onset   Diabetes Mother    Stroke Brother    Cerebral palsy Brother    Coronary artery disease Neg Hx     Allergies  Allergen Reactions   Amlodipine Besy-Benazepril Hcl Cough   Hctz [Hydrochlorothiazide] Other (See Comments)    Per family, every diuretic the patient has been on flushes out her out and ultimately results  in CRAMPING/JAUNDICE (added potassium might help)   Other Other (See Comments)    History of diverticulitis- CANNOT TOLERATE NUTS, CORN, AND ANY FOODS NOT EASILY DIGESTED- the patient AVOIDS these   Cyclobenzaprine Other (See Comments)    Confusion- does not tolerate well    Diclofenac Sodium Diarrhea   Hydralazine Other (See Comments)    Per family, every diuretic the patient has been on flushes out her out and ultimately results in CRAMPING/JAUNDICE (added potassium might help)   Methylprednisolone Other (See Comments)    Medrol/CORTICOSTEROIDS = A LOT OF ENERGY   Oxycodone Other (See Comments)    Caused patient to "act crazy"    Current Outpatient Medications on File Prior to Visit  Medication Sig Dispense Refill   acetaminophen (TYLENOL) 500 MG tablet Take 500 mg by mouth 3 (three) times  daily as needed for headache (pain). Take with tramadol     atorvastatin (LIPITOR) 40 MG tablet TAKE 1 TABLET BY MOUTH DAILY AT 6 PM. (Patient taking differently: Take 40 mg by mouth daily.) 90 tablet 1   cholecalciferol (VITAMIN D3) 25 MCG (1000 UNIT) tablet Take 1,000 Units by mouth 2 (two) times daily.     Coenzyme Q10 (CO Q-10) 300 MG CAPS Take 300 mg by mouth in the morning.     [facility] ASPIRIN ADULT LOW DOSE 81 MG chewable tablet CHEW 1 TABLET (81 MG TOTAL) BY MOUTH DAILY. (Patient taking differently: Chew 81 mg by mouth daily.) 90 tablet 4   ELIQUIS 5 MG TABS tablet TAKE 1 TABLET BY MOUTH TWICE A DAY (Patient taking differently: Take 5 mg by mouth 2 (two) times daily.) 180 tablet 1   ferrous gluconate (FERGON) 324 MG tablet TAKE 1 TABLET BY MOUTH DAILY WITH BREAKFAST (Patient taking differently: Take 324 mg by mouth daily with breakfast.) 90 tablet 1   HM LIDOCAINE PATCH EX Apply 1 patch topically daily as needed (for back pain).     Lidocaine-Menthol, Spray, 4-1 % LIQD Apply 1 spray topically daily as needed (back/knee pain/restless legs).     losartan (COZAAR) 25 MG tablet Take 1 tablet (25 mg  total) by mouth daily. 90 tablet 3   Magnesium 200 MG TABS Take 200 mg by mouth every morning.     Multiple Vitamin (MULTIVITAMIN WITH MINERALS) TABS tablet Take 1 tablet by mouth 2 (two) times daily. Centrum Minis     potassium chloride 20 MEQ/15ML (10%) SOLN Take 15 mLs (20 mEq total) by mouth daily. 1350 mL 3   sodium chloride (OCEAN) 0.65 % SOLN nasal spray Place 1 spray into both nostrils daily as needed for congestion.     terazosin (HYTRIN) 10 MG capsule Take 1 capsule (10 mg total) by mouth daily at 12 noon. 90 capsule 1   torsemide (DEMADEX) 10 MG tablet Take 3 tablets (30 mg total) by mouth daily. 90 tablet 6   traMADol (ULTRAM) 50 MG tablet Take 50 mg by mouth every 6 (six) hours as needed for severe pain.     vitamin B-12 (CYANOCOBALAMIN) 1000 MCG tablet Take 1 tablet (1,000 mcg total) by mouth daily. 30 tablet 3   metoprolol succinate (TOPROL-XL) 100 MG 24 hr tablet Take 1 tablet (100 mg total) by mouth daily. Take with or immediately following a meal. 30 tablet 0   No current facility-administered medications on file prior to visit.    BP 120/64   Pulse 75   Temp 98 F (36.7 C) (Oral)   Ht 5' (1.524 m)   Wt 143 lb (64.9 kg)   SpO2 98%   BMI 27.93 kg/m        Objective:   Physical Exam Vitals and nursing note reviewed.  Constitutional:      Appearance: Normal appearance. She is ill-appearing.  Cardiovascular:     Rate and Rhythm: Rhythm irregular.     Pulses: Normal pulses.     Heart sounds: Normal heart sounds.  Pulmonary:     Breath sounds: Examination of the left-upper field reveals wheezing. Examination of the left-middle field reveals wheezing. Examination of the right-lower field reveals rhonchi. Wheezing and rhonchi present. No decreased breath sounds or rales.  Skin:    General: Skin is warm and dry.  Neurological:     General: No focal deficit present.     Mental Status: She is alert and oriented  to person, place, and time.     Gait: Gait abnormal (in  wheelchair for exam).  Psychiatric:        Mood and Affect: Mood normal.        Behavior: Behavior normal.        Thought Content: Thought content normal.        Judgment: Judgment normal.      Assessment & Plan:  1. COVID-19 virus infection -Reviewed hospital notes, discharge instructions, imaging, and labs.  All questions answered to the best of my ability. Will recheck chest xray due to wheezing and rhonchi  - DG Chest 2 View; Future - Spacer/Aero-Holding Dorise Bullion; Use with inhaler  Dispense: 1 each; Refill: 0 - albuterol (VENTOLIN HFA) 108 (90 Base) MCG/ACT inhaler; Inhale 2 puffs into the lungs every 6 (six) hours as needed for wheezing or shortness of breath.  Dispense: 8 g; Refill: 0  2. Acute on chronic systolic CHF (congestive heart failure) ([place]) - Appears compensated today  - No lower extremity edema - Continue home lasix   3. Other persistent atrial fibrillation (HCC) - Continue Eliquis   4. Gastroesophageal reflux disease, unspecified whether esophagitis present - Continue PPI   5. Acute respiratory failure with hypoxia ([facility]CC) - Resolved   6. Generalized weakness - Continue home health PT   7. Pure hypercholesterolemia - Continue statin   [name], NP

## 2021-06-23 ENCOUNTER — Ambulatory Visit (INDEPENDENT_AMBULATORY_CARE_PROVIDER_SITE_OTHER): Payer: Medicare Other | Admitting: Physician Assistant

## 2021-06-23 ENCOUNTER — Other Ambulatory Visit: Payer: Self-pay

## 2021-06-23 ENCOUNTER — Encounter: Payer: Self-pay | Admitting: Physician Assistant

## 2021-06-23 VITALS — BP 110/60 | HR 76 | Ht 60.0 in

## 2021-06-23 DIAGNOSIS — I6523 Occlusion and stenosis of bilateral carotid arteries: Secondary | ICD-10-CM

## 2021-06-23 DIAGNOSIS — I359 Nonrheumatic aortic valve disorder, unspecified: Secondary | ICD-10-CM | POA: Diagnosis not present

## 2021-06-23 DIAGNOSIS — I5022 Chronic systolic (congestive) heart failure: Secondary | ICD-10-CM

## 2021-06-23 DIAGNOSIS — I2510 Atherosclerotic heart disease of native coronary artery without angina pectoris: Secondary | ICD-10-CM

## 2021-06-23 DIAGNOSIS — N183 Chronic kidney disease, stage 3 unspecified: Secondary | ICD-10-CM

## 2021-06-23 DIAGNOSIS — I480 Paroxysmal atrial fibrillation: Secondary | ICD-10-CM

## 2021-06-23 LAB — BASIC METABOLIC PANEL
BUN/Creatinine Ratio: 32 — ABNORMAL HIGH (ref 12–28)
BUN: 35 mg/dL (ref 10–36)
CO2: 25 mmol/L (ref 20–29)
Calcium: 8.6 mg/dL — ABNORMAL LOW (ref 8.7–10.3)
Chloride: 100 mmol/L (ref 96–106)
Creatinine, Ser: 1.08 mg/dL — ABNORMAL HIGH (ref 0.57–1.00)
Glucose: 101 mg/dL — ABNORMAL HIGH (ref 70–99)
Potassium: 4.5 mmol/L (ref 3.5–5.2)
Sodium: 137 mmol/L (ref 134–144)
eGFR: 48 mL/min/{1.73_m2} — ABNORMAL LOW (ref >59)

## 2021-06-23 MED ORDER — TORSEMIDE 20 MG PO TABS: 40.0000 mg | ORAL_TABLET | Freq: Every day | ORAL | 3 refills | Status: DC

## 2021-06-23 NOTE — Patient Instructions (Addendum)
Medication Instructions:  1.Change torsemide to 20 mg tablets, take 2 tablets (40 mg)  by mouth daily *If you need a refill on your cardiac medications before your next appointment, please call your pharmacy*   Lab Work: BMET today If you have labs (blood work) drawn today and your tests are completely normal, you will receive your results only by: [place] (if you have MyChart) OR A paper copy in the mail If you have any lab test that is abnormal or we need to change your treatment, we will call you to review the results.   Follow-Up: At [facility], you and your health needs are our priority.  As part of our continuing mission to provide you with exceptional heart care, we have created designated Provider Care Teams.  These Care Teams include your primary Cardiologist (physician) and Advanced Practice Providers (APPs -  Physician Assistants and Nurse Practitioners) who all work together to provide you with the care you need, when you need it.   Your next appointment:   09/16/2021  The format for your next appointment:   In Person  Provider:   Burnell Blanks, MD

## 2021-06-24 ENCOUNTER — Other Ambulatory Visit: Payer: Self-pay

## 2021-06-24 ENCOUNTER — Telehealth: Payer: Self-pay | Admitting: Physician Assistant

## 2021-06-24 DIAGNOSIS — I480 Paroxysmal atrial fibrillation: Secondary | ICD-10-CM

## 2021-06-24 DIAGNOSIS — I5022 Chronic systolic (congestive) heart failure: Secondary | ICD-10-CM

## 2021-06-24 NOTE — Telephone Encounter (Signed)
Patient's son returned call for lab results.

## 2021-06-28 ENCOUNTER — Encounter: Payer: Self-pay | Admitting: Adult Health

## 2021-06-30 ENCOUNTER — Encounter: Payer: Self-pay | Admitting: Adult Health

## 2021-07-14 ENCOUNTER — Telehealth: Payer: Self-pay | Admitting: Adult Health

## 2021-07-15 NOTE — Telephone Encounter (Signed)
Patient's son called to get refill on terazosin (HYTRIN) 10 MG capsule    Please send to   [id number] - [place], [place] Phone:  [phone]  Fax:  [phone]         Please advise

## 2021-07-15 NOTE — Telephone Encounter (Signed)
Rx was refilled 07/14/2021. Pt sone Randall notified of update and verbalized understanding.

## 2021-07-21 ENCOUNTER — Other Ambulatory Visit: Payer: Medicare Other | Admitting: *Deleted

## 2021-07-21 ENCOUNTER — Other Ambulatory Visit: Payer: Self-pay

## 2021-07-21 DIAGNOSIS — I5022 Chronic systolic (congestive) heart failure: Secondary | ICD-10-CM

## 2021-07-21 DIAGNOSIS — I480 Paroxysmal atrial fibrillation: Secondary | ICD-10-CM

## 2021-07-21 LAB — BASIC METABOLIC PANEL
BUN/Creatinine Ratio: 38 — ABNORMAL HIGH (ref 12–28)
BUN: 41 mg/dL — ABNORMAL HIGH (ref 10–36)
CO2: 24 mmol/L (ref 20–29)
Calcium: 9.2 mg/dL (ref 8.7–10.3)
Chloride: 102 mmol/L (ref 96–106)
Creatinine, Ser: 1.09 mg/dL — ABNORMAL HIGH (ref 0.57–1.00)
Glucose: 126 mg/dL — ABNORMAL HIGH (ref 70–99)
Potassium: 4.2 mmol/L (ref 3.5–5.2)
Sodium: 139 mmol/L (ref 134–144)
eGFR: 48 mL/min/{1.73_m2} — ABNORMAL LOW (ref >59)

## 2021-07-30 ENCOUNTER — Other Ambulatory Visit: Payer: Self-pay | Admitting: Adult Health

## 2021-07-30 DIAGNOSIS — M8949 Other hypertrophic osteoarthropathy, multiple sites: Secondary | ICD-10-CM

## 2021-07-30 NOTE — Telephone Encounter (Signed)
Last office visit 06/29/21  Ok to refill?

## 2021-07-30 NOTE — Telephone Encounter (Signed)
Patient son called back to request a refill From [place].

## 2021-08-16 ENCOUNTER — Encounter: Payer: Self-pay | Admitting: Adult Health

## 2021-08-18 ENCOUNTER — Other Ambulatory Visit: Payer: Self-pay | Admitting: Adult Health

## 2021-08-18 MED ORDER — METOPROLOL SUCCINATE ER 100 MG PO TB24: 100.0000 mg | ORAL_TABLET | Freq: Every day | ORAL | 1 refills | Status: DC

## 2021-08-18 NOTE — Telephone Encounter (Signed)
Ok to fill?  Please advise

## 2021-08-28 ENCOUNTER — Other Ambulatory Visit: Payer: Self-pay | Admitting: Adult Health

## 2021-09-02 ENCOUNTER — Inpatient Hospital Stay (HOSPITAL_BASED_OUTPATIENT_CLINIC_OR_DEPARTMENT_OTHER)
Admission: EM | Admit: 2021-09-02 | Discharge: 2021-09-05 | DRG: 291 | Disposition: A | Discharge status: Home / Self Care | Payer: Medicare Other | Attending: Internal Medicine | Admitting: Internal Medicine

## 2021-09-02 ENCOUNTER — Other Ambulatory Visit: Payer: Self-pay

## 2021-09-02 ENCOUNTER — Emergency Department (HOSPITAL_BASED_OUTPATIENT_CLINIC_OR_DEPARTMENT_OTHER): Payer: Medicare Other

## 2021-09-02 DIAGNOSIS — E663 Overweight: Secondary | ICD-10-CM | POA: Diagnosis present

## 2021-09-02 DIAGNOSIS — Z7901 Long term (current) use of anticoagulants: Secondary | ICD-10-CM | POA: Diagnosis not present

## 2021-09-02 DIAGNOSIS — Z90710 Acquired absence of both cervix and uterus: Secondary | ICD-10-CM

## 2021-09-02 DIAGNOSIS — I4819 Other persistent atrial fibrillation: Secondary | ICD-10-CM | POA: Diagnosis present

## 2021-09-02 DIAGNOSIS — Z7982 Long term (current) use of aspirin: Secondary | ICD-10-CM

## 2021-09-02 DIAGNOSIS — I4891 Unspecified atrial fibrillation: Secondary | ICD-10-CM

## 2021-09-02 DIAGNOSIS — R7989 Other specified abnormal findings of blood chemistry: Secondary | ICD-10-CM

## 2021-09-02 DIAGNOSIS — Z952 Presence of prosthetic heart valve: Secondary | ICD-10-CM

## 2021-09-02 DIAGNOSIS — J439 Emphysema, unspecified: Secondary | ICD-10-CM | POA: Diagnosis present

## 2021-09-02 DIAGNOSIS — E785 Hyperlipidemia, unspecified: Secondary | ICD-10-CM | POA: Diagnosis present

## 2021-09-02 DIAGNOSIS — K219 Gastro-esophageal reflux disease without esophagitis: Secondary | ICD-10-CM | POA: Diagnosis present

## 2021-09-02 DIAGNOSIS — Z888 Allergy status to other drugs, medicaments and biological substances status: Secondary | ICD-10-CM | POA: Diagnosis not present

## 2021-09-02 DIAGNOSIS — Z833 Family history of diabetes mellitus: Secondary | ICD-10-CM | POA: Diagnosis not present

## 2021-09-02 DIAGNOSIS — N179 Acute kidney failure, unspecified: Secondary | ICD-10-CM | POA: Diagnosis present

## 2021-09-02 DIAGNOSIS — H548 Legal blindness, as defined in USA: Secondary | ICD-10-CM | POA: Diagnosis present

## 2021-09-02 DIAGNOSIS — Z885 Allergy status to narcotic agent status: Secondary | ICD-10-CM | POA: Diagnosis not present

## 2021-09-02 DIAGNOSIS — E1169 Type 2 diabetes mellitus with other specified complication: Secondary | ICD-10-CM | POA: Diagnosis present

## 2021-09-02 DIAGNOSIS — I10 Essential (primary) hypertension: Secondary | ICD-10-CM | POA: Diagnosis not present

## 2021-09-02 DIAGNOSIS — U071 COVID-19: Secondary | ICD-10-CM | POA: Diagnosis present

## 2021-09-02 DIAGNOSIS — E876 Hypokalemia: Secondary | ICD-10-CM | POA: Diagnosis not present

## 2021-09-02 DIAGNOSIS — I5043 Acute on chronic combined systolic (congestive) and diastolic (congestive) heart failure: Secondary | ICD-10-CM | POA: Diagnosis present

## 2021-09-02 DIAGNOSIS — R778 Other specified abnormalities of plasma proteins: Secondary | ICD-10-CM

## 2021-09-02 DIAGNOSIS — Z66 Do not resuscitate: Secondary | ICD-10-CM | POA: Diagnosis present

## 2021-09-02 DIAGNOSIS — Z87891 Personal history of nicotine dependence: Secondary | ICD-10-CM

## 2021-09-02 DIAGNOSIS — Z79899 Other long term (current) drug therapy: Secondary | ICD-10-CM | POA: Diagnosis not present

## 2021-09-02 DIAGNOSIS — I214 Non-ST elevation (NSTEMI) myocardial infarction: Secondary | ICD-10-CM | POA: Diagnosis not present

## 2021-09-02 DIAGNOSIS — I2510 Atherosclerotic heart disease of native coronary artery without angina pectoris: Secondary | ICD-10-CM | POA: Diagnosis present

## 2021-09-02 DIAGNOSIS — I110 Hypertensive heart disease with heart failure: Secondary | ICD-10-CM | POA: Diagnosis not present

## 2021-09-02 DIAGNOSIS — I509 Heart failure, unspecified: Secondary | ICD-10-CM

## 2021-09-02 DIAGNOSIS — R252 Cramp and spasm: Secondary | ICD-10-CM | POA: Diagnosis not present

## 2021-09-02 HISTORY — DX: Other specified abnormal findings of blood chemistry: R79.89

## 2021-09-02 HISTORY — DX: Unspecified atrial fibrillation: I48.91

## 2021-09-02 LAB — COMPREHENSIVE METABOLIC PANEL
ALT: 17 U/L (ref 0–44)
AST: 23 U/L (ref 15–41)
Albumin: 4 g/dL (ref 3.5–5.0)
Alkaline Phosphatase: 66 U/L (ref 38–126)
Anion gap: 13 (ref 5–15)
BUN: 35 mg/dL — ABNORMAL HIGH (ref 8–23)
CO2: 24 mmol/L (ref 22–32)
Calcium: 9.8 mg/dL (ref 8.9–10.3)
Chloride: 102 mmol/L (ref 98–111)
Creatinine, Ser: 1.12 mg/dL — ABNORMAL HIGH (ref 0.44–1.00)
GFR, Estimated: 46 mL/min — ABNORMAL LOW (ref >60)
Glucose, Bld: 214 mg/dL — ABNORMAL HIGH (ref 70–99)
Potassium: 4.6 mmol/L (ref 3.5–5.1)
Sodium: 139 mmol/L (ref 135–145)
Total Bilirubin: 1.1 mg/dL (ref 0.3–1.2)
Total Protein: 6.9 g/dL (ref 6.5–8.1)

## 2021-09-02 LAB — TROPONIN I (HIGH SENSITIVITY)
Troponin I (High Sensitivity): 27 ng/L — ABNORMAL HIGH (ref <18)
Troponin I (High Sensitivity): 85 ng/L — ABNORMAL HIGH (ref <18)

## 2021-09-02 LAB — RESP PANEL BY RT-PCR (FLU A&B, COVID) ARPGX2
Influenza A by PCR: NEGATIVE
Influenza B by PCR: NEGATIVE
SARS Coronavirus 2 by RT PCR: POSITIVE — AB

## 2021-09-02 LAB — CBC WITH DIFFERENTIAL/PLATELET
Abs Immature Granulocytes: 0.02 10*3/uL (ref 0.00–0.07)
Basophils Absolute: 0 10*3/uL (ref 0.0–0.1)
Basophils Relative: 0 %
Eosinophils Absolute: 0.1 10*3/uL (ref 0.0–0.5)
Eosinophils Relative: 1 %
HCT: 38.6 % (ref 36.0–46.0)
Hemoglobin: 12 g/dL (ref 12.0–15.0)
Immature Granulocytes: 0 %
Lymphocytes Relative: 9 %
Lymphs Abs: 0.7 10*3/uL (ref 0.7–4.0)
MCH: 29 pg (ref 26.0–34.0)
MCHC: 31.1 g/dL (ref 30.0–36.0)
MCV: 93.2 fL (ref 80.0–100.0)
Monocytes Absolute: 0.4 10*3/uL (ref 0.1–1.0)
Monocytes Relative: 6 %
Neutro Abs: 6.2 10*3/uL (ref 1.7–7.7)
Neutrophils Relative %: 84 %
Platelets: 228 10*3/uL (ref 150–400)
RBC: 4.14 MIL/uL (ref 3.87–5.11)
RDW: 17.4 % — ABNORMAL HIGH (ref 11.5–15.5)
WBC: 7.4 10*3/uL (ref 4.0–10.5)
nRBC: 0 % (ref 0.0–0.2)

## 2021-09-02 LAB — C-REACTIVE PROTEIN
CRP: 0.6 mg/dL (ref <1.0)
CRP: 0.7 mg/dL (ref <1.0)

## 2021-09-02 LAB — HEMOGLOBIN A1C
Hgb A1c MFr Bld: 6 % — ABNORMAL HIGH (ref 4.8–5.6)
Mean Plasma Glucose: 125.5 mg/dL

## 2021-09-02 LAB — APTT: aPTT: 46 seconds — ABNORMAL HIGH (ref 24–36)

## 2021-09-02 LAB — HEPARIN LEVEL (UNFRACTIONATED): Heparin Unfractionated: >1.1 IU/mL — ABNORMAL HIGH (ref 0.30–0.70)

## 2021-09-02 LAB — BRAIN NATRIURETIC PEPTIDE: B Natriuretic Peptide: 1377.5 pg/mL — ABNORMAL HIGH (ref 0.0–100.0)

## 2021-09-02 MED ORDER — METOPROLOL TARTRATE 25 MG PO TABS
25.0000 mg | ORAL_TABLET | Freq: Two times a day (BID) | ORAL | Status: DC
  Administered 2021-09-02 – 2021-09-03 (×2): 25 mg via ORAL
  Filled 2021-09-02 (×2): qty 1

## 2021-09-02 MED ORDER — ACETAMINOPHEN 325 MG PO TABS
650.0000 mg | ORAL_TABLET | ORAL | Status: DC | PRN
  Administered 2021-09-02: 650 mg via ORAL
  Filled 2021-09-02: qty 2

## 2021-09-02 MED ORDER — FUROSEMIDE 10 MG/ML IJ SOLN
20.0000 mg | Freq: Once | INTRAMUSCULAR | Status: AC
  Administered 2021-09-02: 20 mg via INTRAVENOUS
  Filled 2021-09-02: qty 2

## 2021-09-02 MED ORDER — ATORVASTATIN CALCIUM 40 MG PO TABS
40.0000 mg | ORAL_TABLET | Freq: Every day | ORAL | Status: DC
  Administered 2021-09-03 – 2021-09-05 (×3): 40 mg via ORAL
  Filled 2021-09-02 (×3): qty 1

## 2021-09-02 MED ORDER — POTASSIUM CHLORIDE CRYS ER 20 MEQ PO TBCR
20.0000 meq | EXTENDED_RELEASE_TABLET | Freq: Once | ORAL | Status: AC
  Administered 2021-09-02: 20 meq via ORAL
  Filled 2021-09-02: qty 1

## 2021-09-02 MED ORDER — HEPARIN (PORCINE) 25000 UT/250ML-% IV SOLN
1050.0000 [IU]/h | INTRAVENOUS | Status: DC
  Administered 2021-09-02: 09:00:00 800 [IU]/h via INTRAVENOUS
  Administered 2021-09-03: 1000 [IU]/h via INTRAVENOUS
  Filled 2021-09-02 (×2): qty 250

## 2021-09-02 MED ORDER — SODIUM CHLORIDE 0.9% FLUSH
3.0000 mL | Freq: Two times a day (BID) | INTRAVENOUS | Status: DC
  Administered 2021-09-03 – 2021-09-04 (×4): 3 mL via INTRAVENOUS

## 2021-09-02 MED ORDER — ASPIRIN 81 MG PO CHEW
81.0000 mg | CHEWABLE_TABLET | Freq: Every day | ORAL | Status: DC
  Administered 2021-09-03 – 2021-09-05 (×3): 81 mg via ORAL
  Filled 2021-09-02 (×3): qty 1

## 2021-09-02 MED ORDER — SODIUM CHLORIDE 0.9% FLUSH: 3.0000 mL | INTRAVENOUS | Status: DC | PRN

## 2021-09-02 MED ORDER — DILTIAZEM HCL-DEXTROSE 125-5 MG/125ML-% IV SOLN (PREMIX)
5.0000 mg/h | INTRAVENOUS | Status: DC
  Administered 2021-09-02: 5 mg/h via INTRAVENOUS
  Administered 2021-09-02 – 2021-09-03 (×3): 12.5 mg/h via INTRAVENOUS
  Filled 2021-09-02 (×4): qty 125

## 2021-09-02 MED ORDER — FUROSEMIDE 10 MG/ML IJ SOLN
20.0000 mg | Freq: Two times a day (BID) | INTRAMUSCULAR | Status: DC
  Administered 2021-09-02 – 2021-09-05 (×6): 20 mg via INTRAVENOUS
  Filled 2021-09-02 (×6): qty 2

## 2021-09-02 MED ORDER — SODIUM CHLORIDE 0.9 % IV SOLN: 250.0000 mL | INTRAVENOUS | Status: DC | PRN

## 2021-09-02 MED ORDER — ONDANSETRON HCL 4 MG/2ML IJ SOLN: 4.0000 mg | Freq: Four times a day (QID) | INTRAMUSCULAR | Status: DC | PRN

## 2021-09-02 MED ORDER — APIXABAN 5 MG PO TABS: 5.0000 mg | ORAL_TABLET | Freq: Two times a day (BID) | ORAL | Status: DC

## 2021-09-02 NOTE — Assessment & Plan Note (Signed)
Mild elevation.  We will check subsequent sets.  Suspect some mild demand ischemia but more likely some elevation due to heart failure and atrial fibrillation.

## 2021-09-02 NOTE — ED Provider Notes (Signed)
[place] EMERGENCY DEPT Provider Note   CSN: [id number] Arrival date & time: 09/02/21  0514     History  Chief Complaint  Patient presents with   Shortness of Breath    Tara Fields is a 88 y.o. female.  Patient is a 88 year old female with past medical history of paroxysmal atrial fibrillation, COPD, hypertension, CHF.  Patient presenting today for evaluation of weakness and shortness of breath.  Patient walked to the dining hall of her assisted living facility this morning without difficulty.  She was walking back to her room when she became weak and short of breath.  She denies any chest pain, fevers, or chills.  She denies other complaints.  The history is provided by the patient.  Shortness of Breath Severity:  Moderate Onset quality:  Sudden Duration:  1 hour Timing:  Constant Progression:  Worsening Chronicity:  New Context: activity   Relieved by:  Nothing Worsened by:  Nothing     Home Medications Prior to Admission medications   Medication Sig Start Date End Date Taking? Authorizing Provider  acetaminophen (TYLENOL) 500 MG tablet Take 500 mg by mouth 3 (three) times daily as needed for headache (pain). Take with tramadol    [provider]  albuterol (VENTOLIN HFA) 108 (90 Base) MCG/ACT inhaler Inhale 2 puffs into the lungs every 6 (six) hours as needed for wheezing or shortness of breath. 06/19/21   Nafziger, Tommi Rumps, NP  atorvastatin (LIPITOR) 40 MG tablet TAKE 1 TABLET BY MOUTH DAILY AT 6 PM. 08/28/21   Nafziger, Tommi Rumps, NP  cholecalciferol (VITAMIN D3) 25 MCG (1000 UNIT) tablet Take 1,000 Units by mouth 2 (two) times daily.    [provider]  Coenzyme Q10 (CO Q-10) 300 MG CAPS Take 300 mg by mouth in the morning.    [provider]  [facility] ASPIRIN ADULT LOW DOSE 81 MG chewable tablet CHEW 1 TABLET (81 MG TOTAL) BY MOUTH DAILY. Patient taking differently: Chew 81 mg by mouth daily. 03/10/21   Nafziger, Tommi Rumps, NP  ELIQUIS 5  MG TABS tablet TAKE 1 TABLET BY MOUTH TWICE A DAY Patient taking differently: Take 5 mg by mouth 2 (two) times daily. 03/10/21   Nafziger, Tommi Rumps, NP  ferrous gluconate (FERGON) 324 MG tablet TAKE 1 TABLET BY MOUTH DAILY WITH BREAKFAST Patient taking differently: Take 324 mg by mouth daily with breakfast. 03/10/21   [name], NP  Ferrous Gluconate 324 (37.5 Fe) MG TABS Take 1 tablet by mouth every morning. Patient not taking: Reported on 06/23/2021 06/04/21   [provider]  HM LIDOCAINE PATCH EX Apply 1 patch topically daily as needed (for back pain).    [provider]  Lidocaine-Menthol, Spray, 4-1 % LIQD Apply 1 spray topically daily as needed (back/knee pain/restless legs).    [provider]  losartan (COZAAR) 25 MG tablet Take 1 tablet (25 mg total) by mouth daily. 05/29/21   Nafziger, Tommi Rumps, NP  Magnesium 200 MG TABS Take 200 mg by mouth every morning.    [provider]  metoprolol succinate (TOPROL-XL) 100 MG 24 hr tablet Take 1 tablet (100 mg total) by mouth daily. Take with or immediately following a meal. 08/18/21 09/17/21  Nafziger, Tommi Rumps, NP  Multiple Vitamin (MULTIVITAMIN WITH MINERALS) TABS tablet Take 1 tablet by mouth 2 (two) times daily. Centrum Minis    [provider]  potassium chloride 20 MEQ/15ML (10%) SOLN Take 15 mLs (20 mEq total) by mouth daily. 05/19/21 08/17/21  [name], PA-C  sodium chloride (OCEAN) 0.65 % SOLN nasal spray Place 1 spray into both nostrils daily as needed for congestion.    [provider]  Spacer/Aero-Holding Dorise Bullion Use with inhaler 06/19/21   Nafziger, Tommi Rumps, NP  terazosin (HYTRIN) 10 MG capsule TAKE 1 CAPSULE BY MOUTH EVERY DAY AT 12 NOON 07/14/21   Nafziger, Tommi Rumps, NP  torsemide (DEMADEX) 20 MG tablet Take 2 tablets (40 mg total) by mouth daily. 06/23/21   [name], PA-C  traMADol (ULTRAM) 50 MG tablet TAKE 1 TABLET (50 MG TOTAL) BY MOUTH IN THE MORNING, AT NOON, AND AT BEDTIME.  07/30/21   Nafziger, Tommi Rumps, NP  vitamin B-12 (CYANOCOBALAMIN) 1000 MCG tablet Take 1 tablet (1,000 mcg total) by mouth daily. 04/16/21   Oswald Hillock, MD      Allergies    Amlodipine besy-benazepril hcl, Hctz [hydrochlorothiazide], Other, Cyclobenzaprine, Diclofenac sodium, Hydralazine, Methylprednisolone, and Oxycodone    Review of Systems   Review of Systems  Respiratory:  Positive for shortness of breath.   All other systems reviewed and are negative.  Physical Exam Updated Vital Signs BP 127/79    Pulse (!) 124    Temp 97.9 F (36.6 C) (Oral)    Resp 15    Ht 5' (1.524 m)    Wt 64 kg    SpO2 100%    BMI 27.54 kg/m  Physical Exam Vitals and nursing note reviewed.  Constitutional:      General: She is not in acute distress.    Appearance: She is well-developed. She is not diaphoretic.  HENT:     Head: Normocephalic and atraumatic.  Cardiovascular:     Rate and Rhythm: Tachycardia present. Rhythm irregular.     Heart sounds: No murmur heard.   No friction rub. No gallop.  Pulmonary:     Effort: Pulmonary effort is normal. No respiratory distress.     Breath sounds: Normal breath sounds. No wheezing.  Abdominal:     General: Bowel sounds are normal. There is no distension.     Palpations: Abdomen is soft.     Tenderness: There is no abdominal tenderness.  Musculoskeletal:        General: Normal range of motion.     Cervical back: Normal range of motion and neck supple.  Skin:    General: Skin is warm and dry.  Neurological:     General: No focal deficit present.     Mental Status: She is alert and oriented to person, place, and time.    ED Results / Procedures / Treatments   Labs (all labs ordered are listed, but only abnormal results are displayed) Labs Reviewed  COMPREHENSIVE METABOLIC PANEL - Abnormal; Notable for the following components:      Result Value   Glucose, Bld 214 (*)    BUN 35 (*)    Creatinine, Ser 1.12 (*)    GFR, Estimated 46 (*)    All other  components within normal limits  CBC WITH DIFFERENTIAL/PLATELET - Abnormal; Notable for the following components:   RDW 17.4 (*)    All other components within normal limits  TROPONIN I (HIGH SENSITIVITY) - Abnormal; Notable for the following components:   Troponin I (High Sensitivity) 27 (*)    All other components within normal limits  RESP PANEL BY RT-PCR (FLU A&B, COVID) ARPGX2  BRAIN NATRIURETIC PEPTIDE    EKG EKG Interpretation  Date/Time:  Wednesday September 02 2021 05:23:07 EST Ventricular Rate:  161 PR Interval:  QRS Duration: 146 QT Interval:  322 QTC Calculation: 527 R Axis:   231 Text Interpretation: Atrial fibrillation with rapid ventricular response Right superior axis deviation Non-specific intra-ventricular conduction block Minimal voltage criteria for LVH, may be normal variant ( Cornell product ) Abnormal ECG When compared with ECG of 08-May-2021 02:30, Atrial fibrillation has replaced Wide QRS rhythm Vent. rate has increased BY  81 BPM Confirmed by Veryl Speak [phone]) on 09/02/2021 6:06:35 AM  Radiology No results found.  Procedures Procedures  Continuous cardiac monitoring  Medications Ordered in ED Medications  diltiazem (CARDIZEM) 125 mg in dextrose 5% 125 mL (1 mg/mL) infusion (5 mg/hr Intravenous New Bag/Given 09/02/21 0542)    ED Course/ Medical Decision Making/ A&P  This patient presents to the ED for concern of weakness and shortness of breath, this involves an extensive number of treatment options, and is a complaint that carries with it a high risk of complications and morbidity.  The differential diagnosis includes anemia, cardiac event, pneumonia   Co morbidities that complicate the patient evaluation  None   Additional history obtained:  No additional history or external records needed   Lab Tests:  I Ordered, and personally interpreted labs.  The pertinent results include: CBC, basic metabolic panel, troponin.  These of all  returned and are unremarkable   Imaging Studies ordered:  I ordered imaging studies including chest x-ray I independently visualized and interpreted imaging which showed no acute process I agree with the radiologist interpretation   Cardiac Monitoring:  The patient was maintained on a cardiac monitor.  I personally viewed and interpreted the cardiac monitored which showed an underlying rhythm of: Atrial fibrillation with rapid ventricular response   Medicines ordered and prescription drug management:  I ordered medication including Cardizem for rate control for A. fib with RVR Reevaluation of the patient after these medicines showed that the patient improved I have reviewed the patients home medicines and have made adjustments as needed   Test Considered:  No other tests indicated were considered   Critical Interventions:  IV Cardizem for rate control   Consultations Obtained:  I requested consultation with the hospitalist, with return call pending.  Problem List / ED Course:  Patient presenting here with sudden onset of weakness and shortness of breath.  She arrived here in atrial fibrillation with heart rate in the 150s.  Patient started on Cardizem and work-up initiated.  Rate has improved and feels as though patient will require admission to the hospital for further care.  I have paged hospitalist, however have not received a call back.  Care will be signed out to Dr. Pearline Cables at shift change.  He will speak with the hospitalist and determine the final disposition.   Reevaluation:  After the interventions noted above, I reevaluated the patient and found that they have :improved   Social Determinants of Health:  None   Dispostion:  After consideration of the diagnostic results and the patients response to treatment, I feel that the patent would benefit from inpatient hospitalization for rate control and further observation.  CRITICAL CARE Performed by: Veryl Speak Total critical care time: 40 minutes Critical care time was exclusive of separately billable procedures and treating other patients. Critical care was necessary to treat or prevent imminent or life-threatening deterioration. Critical care was time spent personally by me on the following activities: development of treatment plan with patient and/or surrogate as well as nursing, discussions with consultants, evaluation of patient's response to treatment, examination of patient,  obtaining history from patient or surrogate, ordering and performing treatments and interventions, ordering and review of laboratory studies, ordering and review of radiographic studies, pulse oximetry and re-evaluation of patient's condition.    Final Clinical Impression(s) / ED Diagnoses Final diagnoses:  None    Rx / DC Orders ED Discharge Orders     None         Veryl Speak, MD 09/02/21 2310

## 2021-09-02 NOTE — Progress Notes (Signed)
ANTICOAGULATION CONSULT NOTE - Follow Up Consult  Pharmacy Consult for Heparin Indication: atrial fibrillation  Allergies  Allergen Reactions   Amlodipine Besy-Benazepril Hcl Cough   Hctz [Hydrochlorothiazide] Other (See Comments)    Per family, every diuretic the patient has been on flushes out her out and ultimately results in CRAMPING/JAUNDICE (added potassium might help)   Other Other (See Comments)    History of diverticulitis- CANNOT TOLERATE NUTS, CORN, AND ANY FOODS NOT EASILY DIGESTED- the patient AVOIDS these   Cyclobenzaprine Other (See Comments)    Confusion- does not tolerate well    Diclofenac Sodium Diarrhea   Hydralazine Other (See Comments)    Per family, every diuretic the patient has been on flushes out her out and ultimately results in CRAMPING/JAUNDICE (added potassium might help)   Methylprednisolone Other (See Comments)    Medrol/CORTICOSTEROIDS = A LOT OF ENERGY   Oxycodone Other (See Comments)    Caused patient to "act crazy"    Patient Measurements: Height: 5' (152.4 cm) Weight: 63.5 kg (140 lb) IBW/kg (Calculated) : 45.5 Heparin Dosing Weight: 58.9 kg  Vital Signs: Temp: 97.6 F (36.4 C) (01/18 1628) Temp Source: Oral (01/18 1628) BP: 104/84 (01/18 1628) Pulse Rate: 86 (01/18 1628)  Labs: Recent Labs    09/02/21 0536 09/02/21 0725 09/02/21 1703 09/02/21 1704  HGB 12.0  --   --   --   HCT 38.6  --   --   --   PLT 228  --   --   --   APTT  --   --   --  46*  HEPARINUNFRC  --   --  >1.10*  --   CREATININE 1.12*  --   --   --   TROPONINIHS 27* 85*  --   --     Estimated Creatinine Clearance: 27.2 mL/min (A) (by C-G formula based on SCr of 1.12 mg/dL (H)).  Assessment: Anticoag: Heparin for afib (PTA apixaban LD 1/17 PM) - CBC WNL, no bleeding noted - aPTT 46, HL >1.1 (elevated by recent Eliquis)  Goal of Therapy:  aPTT 66-102 seconds Monitor platelets by anticoagulation protocol: Yes   Plan:  Increase IV heparin to 900  units/hr Check aPTT in 6 hrs. Daily aPTT, HL, and CBC   Jospeh Mangel S. Alford Highland, PharmD, BCPS Clinical Staff Pharmacist [email or web address]  Alford Highland, Morgan Rennert Stillinger 09/02/2021,5:48 PM

## 2021-09-02 NOTE — ED Notes (Signed)
Attempt to call report.  No answer

## 2021-09-02 NOTE — Assessment & Plan Note (Signed)
Stable, likely due to rapid atrial fibrillation.  Expect it should rise once atrial fibrillation under control

## 2021-09-02 NOTE — ED Notes (Signed)
Handoff report given to Janett Billow RN on 3E at [facility]

## 2021-09-02 NOTE — Hospital Course (Signed)
88 year old female past medical history of systolic/diastolic CHF with an ejection fraction of 25 to 30% noted on echocardiogram in May 2022, aortic stenosis status post AVR as well as persistent A. fib on Eliquis who was COVID-positive several months ago (October 2022) who presents presented to the emergency department on 1/18 from her skilled nursing facility with palpitations and shortness of breath.  Patient not hypoxic but found to be in rapid A. fib as well as acute CHF with a BNP of 1300.  Curiously, testing positive for COVID again by PCR in the ED.  Patient started on Cardizem and given dose of Lasix.  Admitted to the hospitalist service.

## 2021-09-02 NOTE — Progress Notes (Signed)
ANTICOAGULATION CONSULT NOTE - Initial Consult  Pharmacy Consult for heparin Indication: atrial fibrillation  Allergies  Allergen Reactions   Amlodipine Besy-Benazepril Hcl Cough   Hctz [Hydrochlorothiazide] Other (See Comments)    Per family, every diuretic the patient has been on flushes out her out and ultimately results in CRAMPING/JAUNDICE (added potassium might help)   Other Other (See Comments)    History of diverticulitis- CANNOT TOLERATE NUTS, CORN, AND ANY FOODS NOT EASILY DIGESTED- the patient AVOIDS these   Cyclobenzaprine Other (See Comments)    Confusion- does not tolerate well    Diclofenac Sodium Diarrhea   Hydralazine Other (See Comments)    Per family, every diuretic the patient has been on flushes out her out and ultimately results in CRAMPING/JAUNDICE (added potassium might help)   Methylprednisolone Other (See Comments)    Medrol/CORTICOSTEROIDS = A LOT OF ENERGY   Oxycodone Other (See Comments)    Caused patient to "act crazy"    Patient Measurements: Height: 5' (152.4 cm) Weight: 64 kg (141 lb) IBW/kg (Calculated) : 45.5 Heparin Dosing Weight: 59kg  Vital Signs: Temp: 97.9 F (36.6 C) (01/18 0526) Temp Source: Oral (01/18 0526) BP: 122/84 (01/18 0645) Pulse Rate: 109 (01/18 0645)  Labs: Recent Labs    09/02/21 0536  HGB 12.0  HCT 38.6  PLT 228  CREATININE 1.12*  TROPONINIHS 27*    Estimated Creatinine Clearance: 27.3 mL/min (A) (by C-G formula based on SCr of 1.12 mg/dL (H)).   Medical History: Past Medical History:  Diagnosis Date   Aortic stenosis    s/p TAVR 26 mm Edwards Sapien 3 THV July 2020   CAD (coronary artery disease)    COPD (chronic obstructive pulmonary disease) (HCC)    Diabetes mellitus (HCC)    Diverticulitis    GERD (gastroesophageal reflux disease)    HTN (hypertension)    Hyperlipidemia    Legally blind in left eye, as defined in [place]    Tobacco abuse     Medications:  Infusions:   diltiazem (CARDIZEM)  infusion 12.5 mg/hr (09/02/21 0726)   heparin      Assessment: 46 yof presented to the ED with SOB. She is on chronic apixaban for history of afib. To transition to IV heparin. Last dose of apixaban was last night. Baseline CBC is WNL and no bleeding noted.   Goal of Therapy:  Heparin level 0.3-0.7 units/ml aPTT 66-103 seconds Monitor platelets by anticoagulation protocol: Yes   Plan:  Heparin gtt 800 units/hr Check an 8 hr heparin level and aPTT Daily heparin level, aPTT and CBC  Karma Ansley, Rande Lawman 09/02/2021,7:56 AM

## 2021-09-02 NOTE — Progress Notes (Signed)
Patient to 3E14 from ED. Vital signs obtained. On monitor CCMD notified. Alert and oriented to room and call light. Call bell within reach.  °Carmine Youngberg R Alfredo Spong, RN  °

## 2021-09-02 NOTE — Assessment & Plan Note (Signed)
Criteria with BMI greater than 25

## 2021-09-02 NOTE — Assessment & Plan Note (Addendum)
Curious, given positive COVID test just a few months ago.  For now, respiratory isolation.  Given normal CRP level, no reason to treat at this time.

## 2021-09-02 NOTE — Assessment & Plan Note (Addendum)
Principal problem.  Echocardiogram in May 2022 noted significantly decreased ejection fraction.  Heart failure team consulted.  For now stable, not hypoxic.  Have started diuretics.  Interestingly, patient and her son both confirm that she does check her weight daily and has had little change.  They say her dry weight is around 140, and that her weight has not changed in the last few days and this was also her weight in the emergency room.  That said, given chest x-ray noting interstitial edema and elevated BNP, I suspect that she has probably lost weight in the last year or 2 and her new normal for her dry weight may be lower than this and so she is likely had some degree of heart failure for a while now.  Difficult to say if the heart failure caused the atrial fibrillation or vice versa.  Has diuresed over 1 L so far.

## 2021-09-02 NOTE — Assessment & Plan Note (Addendum)
Suspect secondary to acute heart failure exacerbation.  Improving with diuresis.  GFR up to 54 and creatinine

## 2021-09-02 NOTE — H&P (Signed)
History and Physical    Patient: Tara Fields YIR:[id number] DOB: 04-Nov-1927 DOA: 09/02/2021 DOS: the patient was seen and examined on 09/02/2021 PCP: [name], NP  Patient coming from: SNF  Chief Complaint: Shortness of breath  HPI: [name] is a 88 y.o. female with medical history significant of systolic/diastolic heart failure with an ejection fraction of 20-25% from echocardiogram May 2022 as well as COPD, aortic stenosis status post TAVR and mild COVID infection (with documented positive COVID PCR) in October who presented to the emergency room on 1/18 with shortness of breath that is started over the last 24 hours.  Patient denies any cough, productive or otherwise.  She has noted no weight changes that she checks her weight daily.  Denies any fever.  When she came to the emergency room, noted to be positive for COVID as confirmed by PCR, and patient found to be in rapid atrial fibrillation as well as acute CHF with an elevated BNP of 1300 and chest x-ray noting interstitial edema.  Patient given Lasix and started on Cardizem drip.  Admitted to the hospitalist service.  Review of Systems: As mentioned in the history of present illness. All other systems reviewed and are negative. Past Medical History:  Diagnosis Date   Aortic stenosis    s/p TAVR 26 mm Edwards Sapien 3 THV July 2020   CAD (coronary artery disease)    COPD (chronic obstructive pulmonary disease) (HCC)    Diabetes mellitus (HCC)    Diverticulitis    GERD (gastroesophageal reflux disease)    HTN (hypertension)    Hyperlipidemia    Legally blind in left eye, as defined in [place]    Tobacco abuse    Past Surgical History:  Procedure Laterality Date   ABDOMINAL HYSTERECTOMY     APPENDECTOMY     BACK SURGERY     CATARACT EXTRACTION, BILATERAL     CHOLECYSTECTOMY     COLONOSCOPY WITH PROPOFOL N/A 04/16/2021   Procedure: COLONOSCOPY WITH PROPOFOL;  Surgeon: Milus Banister, MD;  Location: [facility];   Service: Gastroenterology;  Laterality: N/A;   ESOPHAGOGASTRODUODENOSCOPY (EGD) WITH PROPOFOL N/A 04/15/2021   Procedure: ESOPHAGOGASTRODUODENOSCOPY (EGD) WITH PROPOFOL;  Surgeon: Doran Stabler, MD;  Location: [facility];  Service: Gastroenterology;  Laterality: N/A;   Social History:  reports that she quit smoking about 20 months ago. Her smoking use included cigarettes. She has a 70.00 pack-year smoking history. She has never used smokeless tobacco. She reports that she does not drink alcohol and does not use drugs.  Ambulates with minimal assistance.  General: Alert and oriented x3,  Allergies  Allergen Reactions   Amlodipine Besy-Benazepril Hcl Cough   Hctz [Hydrochlorothiazide] Other (See Comments)    Per family, every diuretic the patient has been on flushes out her out and ultimately results in CRAMPING/JAUNDICE (added potassium might help)   Other Other (See Comments)    History of diverticulitis- CANNOT TOLERATE NUTS, CORN, AND ANY FOODS NOT EASILY DIGESTED- the patient AVOIDS these   Cyclobenzaprine Other (See Comments)    Confusion- does not tolerate well    Diclofenac Sodium Diarrhea   Hydralazine Other (See Comments)    Per family, every diuretic the patient has been on flushes out her out and ultimately results in CRAMPING/JAUNDICE (added potassium might help)   Methylprednisolone Other (See Comments)    Medrol/CORTICOSTEROIDS = A LOT OF ENERGY   Oxycodone Other (See Comments)    Caused patient to "act crazy"    Family History  Problem Relation Age of Onset   Diabetes Mother    Stroke Brother    Cerebral palsy Brother    Coronary artery disease Neg Hx     Prior to Admission medications   Medication Sig Start Date End Date Taking? Authorizing Provider  acetaminophen (TYLENOL) 500 MG tablet Take 500 mg by mouth 3 (three) times daily as needed for headache (pain). Take with tramadol   Yes [provider]  albuterol (VENTOLIN HFA) 108 (90 Base) MCG/ACT  inhaler Inhale 2 puffs into the lungs every 6 (six) hours as needed for wheezing or shortness of breath. 06/19/21  Yes Nafziger, Tommi Rumps, NP  atorvastatin (LIPITOR) 40 MG tablet TAKE 1 TABLET BY MOUTH DAILY AT 6 PM. Patient taking differently: Take 40 mg by mouth daily. 08/28/21  Yes Nafziger, Tommi Rumps, NP  cholecalciferol (VITAMIN D3) 25 MCG (1000 UNIT) tablet Take 1,000 Units by mouth 2 (two) times daily.   Yes [provider]  Coenzyme Q10 (CO Q-10) 300 MG CAPS Take 300 mg by mouth in the morning.   Yes [provider]  [facility] ASPIRIN ADULT LOW DOSE 81 MG chewable tablet CHEW 1 TABLET (81 MG TOTAL) BY MOUTH DAILY. Patient taking differently: Chew 81 mg by mouth daily. 03/10/21  Yes Nafziger, Tommi Rumps, NP  ELIQUIS 5 MG TABS tablet TAKE 1 TABLET BY MOUTH TWICE A DAY Patient taking differently: Take 5 mg by mouth 2 (two) times daily. 03/10/21  Yes Nafziger, Tommi Rumps, NP  ferrous gluconate (FERGON) 324 MG tablet TAKE 1 TABLET BY MOUTH DAILY WITH BREAKFAST Patient taking differently: Take 324 mg by mouth daily with breakfast. 03/10/21  Yes Nafziger, Tommi Rumps, NP  furosemide (LASIX) 20 MG tablet Take 20 mg by mouth 2 (two) times daily. 07/09/21  Yes [provider]  HM LIDOCAINE PATCH EX Apply 1 patch topically daily as needed (for back pain).   Yes [provider]  Lidocaine-Menthol, Spray, 4-1 % LIQD Apply 1 spray topically daily as needed (back/knee pain/restless legs).   Yes [provider]  losartan (COZAAR) 25 MG tablet Take 1 tablet (25 mg total) by mouth daily. 05/29/21  Yes Nafziger, Tommi Rumps, NP  Magnesium 200 MG TABS Take 200 mg by mouth every morning.   Yes [provider]  metoprolol succinate (TOPROL-XL) 100 MG 24 hr tablet Take 1 tablet (100 mg total) by mouth daily. Take with or immediately following a meal. 08/18/21 09/17/21 Yes Nafziger, Tommi Rumps, NP  Multiple Vitamin (MULTIVITAMIN WITH MINERALS) TABS tablet Take 1 tablet by mouth 2 (two) times daily. Centrum Minis    Yes [provider]  potassium chloride 20 MEQ/15ML (10%) SOLN Take 15 mLs (20 mEq total) by mouth daily. 05/19/21 09/02/21 Yes [name], PA-C  sodium chloride (OCEAN) 0.65 % SOLN nasal spray Place 1 spray into both nostrils daily as needed for congestion.   Yes [provider]  terazosin (HYTRIN) 10 MG capsule TAKE 1 CAPSULE BY MOUTH EVERY DAY AT 12 NOON Patient taking differently: Take 10 mg by mouth at bedtime. 07/14/21  Yes Nafziger, Tommi Rumps, NP  torsemide (DEMADEX) 20 MG tablet Take 2 tablets (40 mg total) by mouth daily. 06/23/21  Yes [name], PA-C  traMADol (ULTRAM) 50 MG tablet TAKE 1 TABLET (50 MG TOTAL) BY MOUTH IN THE MORNING, AT NOON, AND AT BEDTIME. Patient taking differently: Take 50 mg by mouth 3 (three) times daily. 07/30/21  Yes Nafziger, Tommi Rumps, NP  vitamin B-12 (CYANOCOBALAMIN) 1000 MCG tablet Take 1 tablet (1,000 mcg total)  by mouth daily. 04/16/21  Yes Oswald Hillock, MD  metoprolol tartrate (LOPRESSOR) 50 MG tablet Take 50 mg by mouth 2 (two) times daily. Patient not taking: Reported on 09/02/2021 07/18/21   [provider]  Spacer/Aero-Holding Dorise Bullion Use with inhaler 06/19/21   [name], NP    Physical Exam: Vitals:   09/02/21 1430 09/02/21 1515 09/02/21 1628 09/02/21 1630  BP: 117/72 (!) 119/91 104/84   Pulse: 85 75 86   Resp: 18 20 19    Temp:   97.6 F (36.4 C)   TempSrc:   Oral   SpO2: 100% 100% 100%   Weight:    63.5 kg  Height:    5' (1.524 m)   General: Alert and oriented x3, no acute distress HEENT: Normocephalic and atraumatic, mucous membranes are moist Neck: Supple, no JVD Cardiovascular: Irregular rhythm, rate controlled Lungs: Decreased breath sounds bibasilar Abdomen: Soft, nontender, nondistended, positive bowel sounds Extremities: No clubbing or cyanosis or edema Neuro: No focal deficits Psychiatry: Patient is appropriate, no evidence of psychoses Skin: No skin breaks, tears or lesions.  Patient does  have some bruising from being on Eliquis, most noteworthy on her right foot where she had dropped a can a few days ago.  There are however no evidence of any infection  Data Reviewed:  Noteworthy labs include BNP of 1377, serial troponins of 27 and then 85 and creatinine of 1.12 with a GFR 46.  In October, creatinine was 0.81 with GFR greater than 60.  Also noteworthy is positive PCR COVID.  EKG notes rapid atrial fibrillation  Assessment/Plan Cardiovascular and Mediastinum Atrial fibrillation with rapid ventricular response ([place]) Assessment & Plan Has been on Eliquis.  On Cardizem drip plus heparin infusion.  Heart rate under control so we will start her home metoprolol at half the dose (1/2 the dose given borderline blood pressures and she is getting IV Lasix)  HTN (hypertension) Assessment & Plan Stable, likely due to rapid atrial fibrillation.  Expect it should rise once atrial fibrillation under control  * Acute on chronic combined systolic and diastolic CHF (congestive heart failure) [facility]) Assessment & Plan Principal problem.  Echocardiogram in May 2022 noted significantly decreased ejection fraction.  Heart failure team consulted.  For now stable, not hypoxic.  Have started diuretics.  Interestingly, patient and her son both confirm that she does check her weight daily and has had little change.  They say her dry weight is around 140, and that her weight has not changed in the last few days and this was also her weight in the emergency room.  That said, given chest x-ray noting interstitial edema and elevated BNP, I suspect that she has probably lost weight in the last year or 2 and her new normal for her dry weight may be lower than this and so she is likely had some degree of heart failure for a while now.  Difficult to say if the heart failure caused the atrial fibrillation or vice versa.  Endocrine Hyperlipidemia associated with type 2 diabetes mellitus ([place]) Assessment & Plan Last  A1c last year was within normal limits.  Rechecking.  Continue statin.:  Sliding scale until A1c returned.  Genitourinary AKI (acute kidney injury) [facility]) Assessment & Plan Suspect secondary to acute heart failure exacerbation.  Should improve with diuresis.  Monitor closely.  Other Elevated troponin Assessment & Plan Mild elevation.  We will check subsequent sets.  Suspect some mild demand ischemia but more likely some elevation due to  heart failure and atrial fibrillation.  Overweight (BMI 25.0-29.9) Assessment & Plan Criteria with BMI greater than 25  Lab test positive for detection of COVID-19 virus Assessment & Plan Curious, given positive COVID test just a few months ago.  For now, respiratory isolation.  Given normal CRP level, no reason to treat at this time.     Advance Care Planning:   Code Status: DNR confirmed by patient  Consults: Consult put in for heart failure team  Family Communication: Updated son by phone  Severity of Illness: The appropriate patient status for this patient is INPATIENT. Inpatient status is judged to be reasonable and necessary in order to provide the required intensity of service to ensure the patient's safety. The patient's presenting symptoms, physical exam findings, and initial radiographic and laboratory data in the context of their chronic comorbidities is felt to place them at high risk for further clinical deterioration. Furthermore, it is not anticipated that the patient will be medically stable for discharge from the hospital within 2 midnights of admission.   * I certify that at the point of admission it is my clinical judgment that the patient will require inpatient hospital care spanning beyond 2 midnights from the point of admission due to high intensity of service, high risk for further deterioration and high frequency of surveillance required.*  Author: Annita Brod, MD 09/02/2021 7:03 PM  For on call review  [email or web address].

## 2021-09-02 NOTE — ED Notes (Signed)
Handoff report given to [facility] 

## 2021-09-02 NOTE — ED Triage Notes (Signed)
Presents for SOB that started last night and has worsened. Endorses diarrhea x4 this AM, dizziness, dark stools (takes iron). H/o COPD, valve replacement, GI bleed in october.

## 2021-09-02 NOTE — Assessment & Plan Note (Addendum)
A1c at 6.0.  Would consider her prediabetic.  Continue statin.:

## 2021-09-02 NOTE — Plan of Care (Signed)
Tara Fields, is a 88 y.o. female, DOB - 04-15-1928, MRN:[id number]   88 year old female with PMH sCHF, COPD, AS s/p AVR, persistent afib on Eliquis who presented to the hospital with cough, Covid +ve few mths ago, presents from SNF with palpitations, shortness of breath, due to Afib RVR with mild CHF, at [facility] on Cardizem drip, in RC and SOB has resolved, still Covid +ve, likely not an active infection, will need a CT count from the lab + CRP to guide Covid Rx.  Admit to tele, for RVR on Cardizem drip, get CT count from Lab + CRP. DNR confirmed.    Vitals:   09/02/21 0554 09/02/21 0600 09/02/21 0630 09/02/21 0645  BP:  127/79 117/81 122/84  Pulse: (!) 114 (!) 124 (!) 113 (!) 109  Resp: 12 15 20 20   Temp:      TempSrc:      SpO2: 100% 100% 100% 100%  Weight:      Height:            Data Review   Micro Results Recent Results (from the past 240 hour(s))  Resp Panel by RT-PCR (Flu A&B, Covid) Nasopharyngeal Swab     Status: Abnormal   Collection Time: 09/02/21  5:44 AM   Specimen: Nasopharyngeal Swab; Nasopharyngeal(NP) swabs in vial transport medium  Result Value Ref Range Status   SARS Coronavirus 2 by RT PCR POSITIVE (A) NEGATIVE Final    Comment: (NOTE) SARS-CoV-2 target nucleic acids are DETECTED.  The SARS-CoV-2 RNA is generally detectable in upper respiratory specimens during the acute phase of infection. Positive results are indicative of the presence of the identified virus, but do not rule out bacterial infection or co-infection with other pathogens not detected by the test. Clinical correlation with patient history and other diagnostic information is necessary to determine patient infection status. The expected result is Negative.  Fact Sheet for Patients: EntrepreneurPulse.com.au  Fact Sheet for Healthcare  Providers: IncredibleEmployment.be  This test is not yet approved or cleared by the [place] FDA and  has been authorized for detection and/or diagnosis of SARS-CoV-2 by FDA under an Emergency Use Authorization (EUA).  This EUA will remain in effect (meaning this test can be used) for the duration of  the COVID-19 declaration under Section 564(b)(1) of the A ct, 21 U.S.C. section 360bbb-3(b)(1), unless the authorization is terminated or revoked sooner.     Influenza A by PCR NEGATIVE NEGATIVE Final   Influenza B by PCR NEGATIVE NEGATIVE Final    Comment: (NOTE) The Xpert Xpress SARS-CoV-2/FLU/RSV plus assay is intended as an aid in the diagnosis of influenza from Nasopharyngeal swab specimens and should not be used as a sole basis for treatment. Nasal washings and aspirates are unacceptable for Xpert Xpress SARS-CoV-2/FLU/RSV testing.  Fact Sheet for Patients: EntrepreneurPulse.com.au  Fact Sheet for Healthcare Providers: IncredibleEmployment.be  This test is not yet approved or cleared by the [place] FDA and has been authorized for detection and/or diagnosis of SARS-CoV-2 by FDA under an Emergency Use Authorization (EUA). This EUA will remain in effect (meaning this test can be used) for the duration of the COVID-19 declaration under Section 564(b)(1) of the Act, 21 U.S.C. section 360bbb-3(b)(1), unless the authorization is terminated or revoked.  Performed at [facility], [address], [place], [place] [address]     Radiology Reports DG Chest [place] 1 View  Result Date: 09/02/2021 CLINICAL DATA:  Short of breath. EXAM: PORTABLE CHEST 1 VIEW COMPARISON:  06/19/2021 FINDINGS:  Cardiac enlargement. Status post TAVR. Aortic atherosclerosis. Diffuse coarsened interstitial markings are identified throughout both lungs compatible with emphysema. Post treatment changes within the perihilar left  upper lobe are again noted increase peripheral interstitial markings identified concerning for mild interstitial edema. New opacity within the periphery of the left base may represent atelectasis or pneumonia. Bones appear diffusely osteopenic. IMPRESSION: 1. New opacity within the periphery of the left base may represent atelectasis/pneumonia. 2. New mild interstitial edema. Correlate for any clinical signs or symptoms of CHF. 3. Emphysema. Electronically Signed   By: Kerby Moors M.D.   On: 09/02/2021 06:16    CBC Recent Labs  Lab 09/02/21 0536  WBC 7.4  HGB 12.0  HCT 38.6  PLT 228  MCV 93.2  MCH 29.0  MCHC 31.1  RDW 17.4*  LYMPHSABS 0.7  MONOABS 0.4  EOSABS 0.1  BASOSABS 0.0    Chemistries  Recent Labs  Lab 09/02/21 0536  NA 139  K 4.6  CL 102  CO2 24  GLUCOSE 214*  BUN 35*  CREATININE 1.12*  CALCIUM 9.8  AST 23  ALT 17  ALKPHOS 66  BILITOT 1.1   ------------------------------------------------------------------------------------------------------------------ estimated creatinine clearance is 27.3 mL/min (A) (by C-G formula based on SCr of 1.12 mg/dL (H)). ------------------------------------------------------------------------------------------------------------------ No results for input(s): HGBA1C in the last 72 hours. ------------------------------------------------------------------------------------------------------------------ No results for input(s): CHOL, HDL, LDLCALC, TRIG, CHOLHDL, LDLDIRECT in the last 72 hours. ------------------------------------------------------------------------------------------------------------------ No results for input(s): TSH, T4TOTAL, T3FREE, THYROIDAB in the last 72 hours.  Invalid input(s): FREET3 ------------------------------------------------------------------------------------------------------------------ No results for input(s): VITAMINB12, FOLATE, FERRITIN, TIBC, IRON, RETICCTPCT in the last 72  hours.  Coagulation profile No results for input(s): INR, PROTIME in the last 168 hours.  No results for input(s): DDIMER in the last 72 hours.  Cardiac Enzymes No results for input(s): CKMB, TROPONINI, MYOGLOBIN in the last 168 hours.  Invalid input(s): CK ------------------------------------------------------------------------------------------------------------------ Invalid input(s): POCBNP   Signature  Lala Lund M.D on 09/02/2021 at 7:16 AM   -  To page go to [email or web address]

## 2021-09-02 NOTE — Assessment & Plan Note (Addendum)
Has been on Eliquis.  Initially on Cardizem drip plus heparin infusion.  Now rate controlled.  Weaning off Cardizem drip and restarting home metoprolol.  We will also restart Eliquis.

## 2021-09-02 NOTE — ED Notes (Signed)
[facility] at bedside 

## 2021-09-02 NOTE — ED Notes (Signed)
CRITICAL VALUE STICKER  CRITICAL VALUE:Troponin 57  RECEIVER (on-site recipient of call):Shawnie Pons, RN  DATE & TIME NOTIFIED: 09-02-2021 0815  MESSENGER (representative from lab):  MD NOTIFIED: Dr. Pearline Cables  TIME OF NOTIFICATION:0820  RESPONSE:

## 2021-09-02 NOTE — ED Provider Notes (Addendum)
°  Provider Note MRN:  [id number]  Arrival date & time: 09/02/21    ED Course and Medical Decision Making  Assumed care from Dr Stark Jock at shift change.  See not from prior team for complete details, in brief: 88 yo female, parox afib (eliquis, lopressor) /COPD/CHF (torsemide), assisted living facility resident. Dyspnea/weakness with mild exertion. Afib initially 150-160's, on cardiazem infusion. Covid oct, now pos again. Some pulm edema on CXR, BNP 1300, trop mildly elevated. HR improved w. Cardiazem infusion, BP stable.   Heparin and cardiazem infusion, also given lasix  Recommend admission for afib RVR, COVID19. Pt agreeable.  D/w hospitalist who accepts pt for admission.  Pt is DNR, confirmed with pt/son at bedside.   .Critical Care Performed by: Jeanell Sparrow, DO Authorized by: Jeanell Sparrow, DO   Critical care provider statement:    Critical care time (minutes):  34   Critical care time was exclusive of:  Separately billable procedures and treating other patients   Critical care was necessary to treat or prevent imminent or life-threatening deterioration of the following conditions:  Cardiac failure   Critical care was time spent personally by me on the following activities:  Development of treatment plan with patient or surrogate, discussions with consultants, evaluation of patient's response to treatment, examination of patient, ordering and review of laboratory studies, ordering and review of radiographic studies, ordering and performing treatments and interventions, pulse oximetry, re-evaluation of patient's condition and review of old charts   Care discussed with: admitting provider     Final Clinical Impressions(s) / ED Diagnoses     ICD-10-CM   1. Atrial fibrillation with RVR (HCC)  I48.91     2. Acute on chronic congestive heart failure, unspecified heart failure type (HCC)  I50.9     3. NSTEMI (non-ST elevated myocardial infarction) ([place])  I21.4     4. COVID-19   U07.1       ED Discharge Orders     None       Discharge Instructions   None         Jeanell Sparrow, DO 09/02/21 0720    Jeanell Sparrow, DO 09/02/21 [phone]

## 2021-09-03 DIAGNOSIS — R252 Cramp and spasm: Secondary | ICD-10-CM | POA: Diagnosis not present

## 2021-09-03 DIAGNOSIS — E663 Overweight: Secondary | ICD-10-CM

## 2021-09-03 LAB — APTT
aPTT: 58 seconds — ABNORMAL HIGH (ref 24–36)
aPTT: 66 seconds — ABNORMAL HIGH (ref 24–36)

## 2021-09-03 LAB — CBC
HCT: 33.8 % — ABNORMAL LOW (ref 36.0–46.0)
Hemoglobin: 10.8 g/dL — ABNORMAL LOW (ref 12.0–15.0)
MCH: 29.7 pg (ref 26.0–34.0)
MCHC: 32 g/dL (ref 30.0–36.0)
MCV: 92.9 fL (ref 80.0–100.0)
Platelets: 193 10*3/uL (ref 150–400)
RBC: 3.64 MIL/uL — ABNORMAL LOW (ref 3.87–5.11)
RDW: 17.3 % — ABNORMAL HIGH (ref 11.5–15.5)
WBC: 6.1 10*3/uL (ref 4.0–10.5)
nRBC: 0 % (ref 0.0–0.2)

## 2021-09-03 LAB — BASIC METABOLIC PANEL
Anion gap: 11 (ref 5–15)
BUN: 24 mg/dL — ABNORMAL HIGH (ref 8–23)
CO2: 23 mmol/L (ref 22–32)
Calcium: 8.9 mg/dL (ref 8.9–10.3)
Chloride: 106 mmol/L (ref 98–111)
Creatinine, Ser: 0.99 mg/dL (ref 0.44–1.00)
GFR, Estimated: 54 mL/min — ABNORMAL LOW (ref >60)
Glucose, Bld: 154 mg/dL — ABNORMAL HIGH (ref 70–99)
Potassium: 4.2 mmol/L (ref 3.5–5.1)
Sodium: 140 mmol/L (ref 135–145)

## 2021-09-03 LAB — HEPARIN LEVEL (UNFRACTIONATED): Heparin Unfractionated: >1.1 IU/mL — ABNORMAL HIGH (ref 0.30–0.70)

## 2021-09-03 LAB — TROPONIN I (HIGH SENSITIVITY)
Troponin I (High Sensitivity): 123 ng/L (ref <18)
Troponin I (High Sensitivity): 100 ng/L (ref <18)

## 2021-09-03 MED ORDER — APIXABAN 5 MG PO TABS
5.0000 mg | ORAL_TABLET | Freq: Two times a day (BID) | ORAL | Status: DC
  Administered 2021-09-03 – 2021-09-05 (×4): 5 mg via ORAL
  Filled 2021-09-03 (×4): qty 1

## 2021-09-03 MED ORDER — METHOCARBAMOL 500 MG PO TABS
500.0000 mg | ORAL_TABLET | Freq: Three times a day (TID) | ORAL | Status: DC | PRN
  Administered 2021-09-03: 500 mg via ORAL
  Filled 2021-09-03: qty 1

## 2021-09-03 MED ORDER — METOPROLOL TARTRATE 50 MG PO TABS
50.0000 mg | ORAL_TABLET | Freq: Two times a day (BID) | ORAL | Status: DC
  Administered 2021-09-03 – 2021-09-05 (×4): 50 mg via ORAL
  Filled 2021-09-03 (×4): qty 1

## 2021-09-03 MED ORDER — METOPROLOL TARTRATE 25 MG PO TABS
25.0000 mg | ORAL_TABLET | Freq: Once | ORAL | Status: AC
  Administered 2021-09-03: 25 mg via ORAL
  Filled 2021-09-03: qty 1

## 2021-09-03 NOTE — Progress Notes (Signed)
[facility] Progress Note  Patient: Tara Fields    ONG:[id number]  [place]: 09/02/2021    Date of Service: the patient was seen and examined on 09/03/2021  Brief hospital course: 88 year old female past medical history of systolic/diastolic CHF with an ejection fraction of 25 to 30% noted on echocardiogram in May 2022, aortic stenosis status post AVR as well as persistent A. fib on Eliquis who was COVID-positive several months ago (October 2022) who presents presented to the emergency department on 1/18 from her skilled nursing facility with palpitations and shortness of breath.  Patient not hypoxic but found to be in rapid A. fib as well as acute CHF with a BNP of 1300.  Curiously, testing positive for COVID again by PCR in the ED.  Patient started on Cardizem and given dose of Lasix.  Admitted to the hospitalist service.  Assessment and Plan: Cardiovascular and Mediastinum Atrial fibrillation with rapid ventricular response (HCC) Assessment & Plan Has been on Eliquis.  Initially on Cardizem drip plus heparin infusion.  Now rate controlled.  Weaning off Cardizem drip and restarting home metoprolol.  We will also restart Eliquis.  HTN (hypertension) Assessment & Plan Stable, likely due to rapid atrial fibrillation.  Expect it should rise once atrial fibrillation under control  * Acute on chronic combined systolic and diastolic CHF (congestive heart failure) [facility]) Assessment & Plan Principal problem.  Echocardiogram in May 2022 noted significantly decreased ejection fraction.  Heart failure team consulted.  For now stable, not hypoxic.  Have started diuretics.  Interestingly, patient and her son both confirm that she does check her weight daily and has had little change.  They say her dry weight is around 140, and that her weight has not changed in the last few days and this was also her weight in the emergency room.  That said, given chest x-ray noting interstitial edema and elevated BNP,  I suspect that she has probably lost weight in the last year or 2 and her new normal for her dry weight may be lower than this and so she is likely had some degree of heart failure for a while now.  Difficult to say if the heart failure caused the atrial fibrillation or vice versa.  Has diuresed over 1 L so far.  Endocrine Hyperlipidemia associated with type 2 diabetes mellitus ([place]) Assessment & Plan A1c at 6.0.  Would consider her prediabetic.  Continue statin.:  Genitourinary AKI (acute kidney injury) [facility]) Assessment & Plan Suspect secondary to acute heart failure exacerbation.  Improving with diuresis.  GFR up to 54 and creatinine  Other Leg cramps Assessment & Plan Unclear etiology.  Hypokalemic.  Seem to get better with muscle relaxers and some warm compresses.  Resolved.  Elevated troponin Assessment & Plan Mild elevation.  We will check subsequent sets.  Suspect some mild demand ischemia but more likely some elevation due to heart failure and atrial fibrillation.  Overweight (BMI 25.0-29.9) Assessment & Plan Criteria with BMI greater than 25  Lab test positive for detection of COVID-19 virus Assessment & Plan Curious, given positive COVID test just a few months ago.  For now, respiratory isolation.  Given normal CRP level, no reason to treat at this time.    Body mass index is 27.81 kg/m.    Pressure Injury 04/14/21 Sacrum Posterior;Upper Stage 2 -  Partial thickness loss of dermis presenting as a shallow open injury with a red, pink wound bed without slough. (Active)  04/14/21 1000  Location: Sacrum  Location  Orientation: Posterior;Upper  Staging: Stage 2 -  Partial thickness loss of dermis presenting as a shallow open injury with a red, pink wound bed without slough.  Wound Description (Comments):   Present on Admission: Yes     Pressure Injury 05/05/21 Sacrum Medial Stage 1 -  Intact skin with non-blanchable redness of a localized area usually over a bony  prominence. (Active)  05/05/21 2237  Location: Sacrum  Location Orientation: Medial  Staging: Stage 1 -  Intact skin with non-blanchable redness of a localized area usually over a bony prominence.  Wound Description (Comments):   Present on Admission: Yes     Consultants: None  Procedures: None  Antimicrobials: None  Code Status: DNR   Subjective: Patient doing okay.  Had some severe leg cramps last night and into this morning, twice episodes.  Breathing Remdisivir.  No other complaints.  Objective: Normal vital signs although noted some mild bradycardia earlier in the day Vitals:   09/03/21 0729 09/03/21 1153  BP: 128/60 (!) 108/53  Pulse: 74 61  Resp: 20 20  Temp: 97.9 F (36.6 C) 97.8 F (36.6 C)  SpO2: 100% 96%    Intake/Output Summary (Last 24 hours) at 09/03/2021 1414 Last data filed at 09/03/2021 1405 Gross per 24 hour  Intake 759.6 ml  Output 1100 ml  Net -340.4 ml   Filed Weights   09/02/21 1630 09/03/21 0400 09/03/21 0751  Weight: 63.5 kg 62.9 kg 64.6 kg   Body mass index is 27.81 kg/m.  Exam:  General: Alert and oriented x3, no acute distress HEENT: Normocephalic and atraumatic, mucous membranes are moist Cardiovascular: Irregular rhythm, bradycardic Respiratory: Clear to auscultation bilaterally Abdomen: Soft, nontender, nondistended, positive bowel sounds Musculoskeletal: No clubbing or cyanosis, no edema Skin: There is some significant bruising on dorsal aspect of right foot (patient on anticoagulation and had dropped a can on her foot few days prior) Psychiatry: Appropriate, no evidence of psychoses Neurology: No focal deficits  Data Reviewed: Labs noteworthy for slightly increased troponin of 123, up from 85 from previous day.  Disposition:  Status is: Inpatient  Remains inpatient appropriate because: Need for continued diuresis.  Weaning off of Cardizem drip    Family Communication: Son updated DVT Prophylaxis:    Heparin  changing back to Eliquis   Author: Annita Brod ,MD 09/03/2021 2:14 PM  To reach On-call, see care teams to locate the attending and reach out via [email or web address]. Between 7PM-7AM, please contact night-coverage If you still have difficulty reaching the attending provider, please page the [facility] (Director on Call) for [facility] on amion for assistance.

## 2021-09-03 NOTE — TOC Progression Note (Addendum)
Transition of Care [facility]) - Progression Note    Patient Details  Name: Tara Fields MRN: [id number] Date of Birth: [birth year]  Transition of [facility] CM/SW Contact  Zenon Mayo, RN Phone Number: 09/03/2021, 11:36 AM  Clinical Narrative:    From [facility] ID[facility], COVID positive, acute on chronic CHF conts on heparin drip, cardizem drip and iv lasix .  Patient has a LEGAL GUARDIAN. TOC will continue to follow for dc needs.         Expected Discharge Plan and Services                                                 Social Determinants of Health (SDOH) Interventions    Readmission Risk Interventions Readmission Risk Prevention Plan 06/08/2021 05/07/2021 04/16/2021  Transportation Screening Complete Complete Complete  PCP or Specialist Appt within 3-5 Days - - Complete  HRI or Home Care Consult - - Complete  Palliative Care Screening - - Not Applicable  Medication Review (RN Care Manager) Complete Complete -  PCP or Specialist appointment within 3-5 days of discharge - Complete -  [place] or Home Care Consult Complete Complete -  SW Recovery Care/Counseling Consult Complete - -  Palliative Care Screening Not Applicable Not Applicable -  [place] Not Applicable Not Applicable -  Some recent data might be hidden

## 2021-09-03 NOTE — Plan of Care (Signed)
  Problem: Clinical Measurements: Goal: Respiratory complications will improve Outcome: Progressing   Problem: Coping: Goal: Level of anxiety will decrease Outcome: Progressing   

## 2021-09-03 NOTE — Progress Notes (Signed)
ANTICOAGULATION CONSULT NOTE - Follow Up Consult  Pharmacy Consult for Heparin Indication: atrial fibrillation  Allergies  Allergen Reactions   Amlodipine Besy-Benazepril Hcl Cough   Hctz [Hydrochlorothiazide] Other (See Comments)    Per family, every diuretic the patient has been on flushes out her out and ultimately results in CRAMPING/JAUNDICE (added potassium might help)   Other Other (See Comments)    History of diverticulitis- CANNOT TOLERATE NUTS, CORN, AND ANY FOODS NOT EASILY DIGESTED- the patient AVOIDS these   Cyclobenzaprine Other (See Comments)    Confusion- does not tolerate well    Diclofenac Sodium Diarrhea   Hydralazine Other (See Comments)    Per family, every diuretic the patient has been on flushes out her out and ultimately results in CRAMPING/JAUNDICE (added potassium might help)   Methylprednisolone Other (See Comments)    Medrol/CORTICOSTEROIDS = A LOT OF ENERGY   Oxycodone Other (See Comments)    Caused patient to "act crazy"    Patient Measurements: Height: 5' (152.4 cm) Weight: 63.5 kg (140 lb) IBW/kg (Calculated) : 45.5 Heparin Dosing Weight: 58.9 kg  Vital Signs: Temp: 97.9 F (36.6 C) (01/19 0000) Temp Source: Oral (01/19 0000) BP: 109/62 (01/19 0000) Pulse Rate: 87 (01/19 0000)  Labs: Recent Labs    09/02/21 0536 09/02/21 0725 09/02/21 1703 09/02/21 1704 09/03/21 0046  HGB 12.0  --   --   --   --   HCT 38.6  --   --   --   --   PLT 228  --   --   --   --   APTT  --   --   --  46* 58*  HEPARINUNFRC  --   --  >1.10*  --   --   CREATININE 1.12*  --   --   --   --   TROPONINIHS 27* 85*  --   --   --      Estimated Creatinine Clearance: 27.2 mL/min (A) (by C-G formula based on SCr of 1.12 mg/dL (H)).  Assessment: 89 YOF on IV heparin for afib (PTA apixaban LD 1/17 PM)  - aPTT trending up but remains subtherapeutic at 58 on 900 units/hr of IV heparin. RN reports no s/s of bleeding   Goal of Therapy:  Heparin level 0.3-0.7  units/ml aPTT 66-102 seconds Monitor platelets by anticoagulation protocol: Yes   Plan:  Increase IV heparin to 1000 units/hr Check aPTT and HL in 6 hrs. Daily aPTT, HL, and CBC   Albertina Parr, PharmD., BCPS, BCCCP Clinical Pharmacist Please refer to [facility] for unit-specific pharmacist

## 2021-09-03 NOTE — Assessment & Plan Note (Addendum)
Unclear etiology.  Hypokalemic.  Seem to get better with muscle relaxers and some warm compresses.  Resolved.

## 2021-09-03 NOTE — Progress Notes (Signed)
Mobility Specialist Progress Note:   09/03/21 1500  Mobility  Activity Ambulated with assistance in room  Level of Assistance Standby assist, set-up cues, supervision of patient - no hands on  Assistive Device Front wheel walker  Distance Ambulated (ft) 80 ft  Activity Response Tolerated well  $Mobility charge 1 Mobility   Pt received in bed willing to participate in mobility. No complaints of pain and asymptomatic. Pt left in bed with call bell in reach and all needs met.   [facility] Public librarian Phone [phone] Secondary Phone [phone]

## 2021-09-03 NOTE — Progress Notes (Signed)
ANTICOAGULATION CONSULT NOTE - Follow Up Consult  Pharmacy Consult for Heparin Indication: atrial fibrillation  Allergies  Allergen Reactions   Amlodipine Besy-Benazepril Hcl Cough   Hctz [Hydrochlorothiazide] Other (See Comments)    Per family, every diuretic the patient has been on flushes out her out and ultimately results in CRAMPING/JAUNDICE (added potassium might help)   Other Other (See Comments)    History of diverticulitis- CANNOT TOLERATE NUTS, CORN, AND ANY FOODS NOT EASILY DIGESTED- the patient AVOIDS these   Cyclobenzaprine Other (See Comments)    Confusion- does not tolerate well    Diclofenac Sodium Diarrhea   Hydralazine Other (See Comments)    Per family, every diuretic the patient has been on flushes out her out and ultimately results in CRAMPING/JAUNDICE (added potassium might help)   Methylprednisolone Other (See Comments)    Medrol/CORTICOSTEROIDS = A LOT OF ENERGY   Oxycodone Other (See Comments)    Caused patient to "act crazy"    Patient Measurements: Height: 5' (152.4 cm) Weight: 64.6 kg (142 lb 6.7 oz) IBW/kg (Calculated) : 45.5 Heparin Dosing Weight: 58.9 kg  Vital Signs: Temp: 97.9 F (36.6 C) (01/19 0729) Temp Source: Oral (01/19 0729) BP: 128/60 (01/19 0729) Pulse Rate: 74 (01/19 0729)  Labs: Recent Labs    09/02/21 0536 09/02/21 0725 09/02/21 1703 09/02/21 1704 09/03/21 0046 09/03/21 0352 09/03/21 1029  HGB 12.0  --   --   --   --  10.8*  --   HCT 38.6  --   --   --   --  33.8*  --   PLT 228  --   --   --   --  193  --   APTT  --   --   --  46* 58*  --  66*  HEPARINUNFRC  --   --  >1.10*  --   --   --  >1.10*  CREATININE 1.12*  --   --   --   --  0.99  --   TROPONINIHS 27* 85*  --   --   --   --   --      Estimated Creatinine Clearance: 31 mL/min (by C-G formula based on SCr of 0.99 mg/dL).  Assessment: 89 YOF on IV heparin for afib (PTA apixaban LD 1/17 PM). aPTT is therapeutic at 66 seconds but low end of range, heparin level  altered by recent DOAC use.  Goal of Therapy:  Heparin level 0.3-0.7 units/ml aPTT 66-102 seconds Monitor platelets by anticoagulation protocol: Yes   Plan:  Increase IV heparin to 1050 units/hr Daily aPTT, HL, and CBC   Arrie Senate, PharmD, BCPS, [facility] Clinical Pharmacist [phone] Please check AMION for all [facility] numbers 09/03/2021

## 2021-09-03 NOTE — Progress Notes (Signed)
Heart Failure Nurse Navigator Progress Note  Pt declined HV TOC clinic appt upon DC from hospitalization. Pt educated on benefits of HV TOC. Pt education complete regarding HF patient booklet. Has follow up with Dr. Julianne Handler 2/1, declined having another appointment.   Education Assessment and Provision:  Detailed education and instructions provided on heart failure disease management including the following:  Signs and symptoms of Heart Failure When to call the physician Importance of daily weights Low sodium diet Fluid restriction Medication management Anticipated future follow-up appointments  Patient education given on each of the above topics.  Patient acknowledges understanding via teach back method and acceptance of all instructions.  Education Materials:  "Living Better With Heart Failure" Booklet, HF zone tool, & Daily Weight Tracker Tool.  Patient has scale at home: yes Patient has pill box at home: yes  Encouraged pt to watch amount of sodium as she eats at the dining hall at her [place], [place].    Pricilla Holm, MSN, RN Heart Failure Nurse Navigator [phone]

## 2021-09-04 DIAGNOSIS — E1169 Type 2 diabetes mellitus with other specified complication: Secondary | ICD-10-CM

## 2021-09-04 DIAGNOSIS — E785 Hyperlipidemia, unspecified: Secondary | ICD-10-CM

## 2021-09-04 DIAGNOSIS — I10 Essential (primary) hypertension: Secondary | ICD-10-CM

## 2021-09-04 LAB — BASIC METABOLIC PANEL
Anion gap: 10 (ref 5–15)
BUN: 23 mg/dL (ref 8–23)
CO2: 21 mmol/L — ABNORMAL LOW (ref 22–32)
Calcium: 9 mg/dL (ref 8.9–10.3)
Chloride: 107 mmol/L (ref 98–111)
Creatinine, Ser: 0.88 mg/dL (ref 0.44–1.00)
GFR, Estimated: >60 mL/min (ref >60)
Glucose, Bld: 150 mg/dL — ABNORMAL HIGH (ref 70–99)
Potassium: 3.7 mmol/L (ref 3.5–5.1)
Sodium: 138 mmol/L (ref 135–145)

## 2021-09-04 LAB — CBC
HCT: 35.6 % — ABNORMAL LOW (ref 36.0–46.0)
Hemoglobin: 11.5 g/dL — ABNORMAL LOW (ref 12.0–15.0)
MCH: 29.9 pg (ref 26.0–34.0)
MCHC: 32.3 g/dL (ref 30.0–36.0)
MCV: 92.5 fL (ref 80.0–100.0)
Platelets: 222 10*3/uL (ref 150–400)
RBC: 3.85 MIL/uL — ABNORMAL LOW (ref 3.87–5.11)
RDW: 17 % — ABNORMAL HIGH (ref 11.5–15.5)
WBC: 7.5 10*3/uL (ref 4.0–10.5)
nRBC: 0 % (ref 0.0–0.2)

## 2021-09-04 LAB — TROPONIN I (HIGH SENSITIVITY): Troponin I (High Sensitivity): 69 ng/L — ABNORMAL HIGH (ref <18)

## 2021-09-04 NOTE — Care Management Important Message (Signed)
Important Message  Patient Details  Name: Miko Markwood MRN: [id number] Date of Birth: 1928/03/30   Medicare Important Message Given:  Yes     Shelda Altes 09/04/2021, 10:03 AM

## 2021-09-04 NOTE — TOC Initial Note (Addendum)
Transition of Care [facility]) - Initial/Assessment Note    Patient Details  Name: Tara Fields MRN: [id number] Date of Birth: 05-07-1928  Transition of Care [facility]) CM/SW Contact:    Zenon Mayo, RN Phone Number: 09/04/2021, 2:34 PM  Clinical Narrative:                 Patient is from [facility], she has a legal [place] , [name] [phone].  NCM contacted Tommie Raymond, he states yes she is in IDL , he goes by to see her 4x/week and fix her medications for her. She gets her meals onsite 3x/per day,uses a walker.  Tommie Raymond takes her to her MD apts. NCM offered choice to [facility], he states she had [facility] in the past and really liked them so he will go with [facility].  NCM made referral to [facility] with [facility]  for [place].  Per Corene Cornea he can take the referral.  Soc will begin 24 to 48 hrs post dc.  Also NCM notified the physical therapist, Marygrace Drought that patient will be going to IDL not ALF and asked will HHPT still be appropriate for patient , she states yes.  TOC will continue to follow for dc needs. Tommie Raymond states he will transport her by car at discharge back to IDL. NCM informed Corene Cornea to contact [place] for therapy apt times.    Expected Discharge Plan: [place] Barriers to Discharge: Continued Medical Work up   Patient Goals and CMS Choice   CMS Medicare.gov Compare Post Acute Care list provided to:: Patient Represenative (must comment) Choice offered to / list presented to : [place] / [place]  Expected Discharge Plan and Services Expected Discharge Plan: [place]   Discharge Planning Services: CM Consult Post Acute Care Choice: [place] arrangements for the past 2 months: [facility])                   DME Agency: NA       HH Arranged: RN, PT [place] Agency: [place] ([place]) Date [place]: 09/04/21 Time [place]: 1429 Representative spoke with  at [place]: Corene Cornea  Prior Living Arrangements/Services Living arrangements for the past 2 months: [place] ([employer]) Lives with:: Facility Resident Patient language and need for interpreter reviewed:: Yes        Need for Family Participation in Patient Care: Yes (Comment) Care giver support system in place?: Yes (comment)   Criminal Activity/Legal Involvement Pertinent to Current Situation/Hospitalization: No - Comment as needed  Activities of Daily Living      Permission Sought/Granted                  Emotional Assessment       Orientation: : Oriented to Place, Oriented to  Time, Oriented to Situation, Oriented to Self Alcohol / Substance Use: Not Applicable Psych Involvement: No (comment)  Admission diagnosis:  NSTEMI (non-ST elevated myocardial infarction) ([place]) [I21.4] Atrial fibrillation with rapid ventricular response ([place]) [I48.91] Atrial fibrillation with RVR ([place]) [I48.91] Acute on chronic congestive heart failure, unspecified heart failure type ([place]) [I50.9] COVID-19 [U07.1] Patient Active Problem List   Diagnosis Date Noted   Leg cramps 09/03/2021   Atrial fibrillation with rapid ventricular response ([place]) 09/02/2021   Elevated troponin 09/02/2021   Lab test positive for detection of COVID-19 virus    Acute on chronic combined systolic and diastolic CHF (congestive heart failure) (  [place])    AKI (acute kidney injury) ([place])    Hyperlipidemia associated with type 2 diabetes mellitus ([place])    Overweight (BMI 25.0-29.9)    COVID-19 virus infection 06/07/2021   Generalized weakness 06/07/2021   GERD (gastroesophageal reflux disease) 06/07/2021   CHF (congestive heart failure) ([place]) 05/05/2021   GI bleed 04/13/2021   Anemia 04/13/2021   Symptomatic anemia 04/13/2021   Prolonged QT interval 04/13/2021   CKD (chronic kidney disease), stage III ([place]) 04/13/2021   Acute on chronic systolic CHF (congestive heart failure) ([place])  01/29/2021   Cognitive deficits    Pressure injury of skin 12/19/2020   Acute exacerbation of CHF (congestive heart failure) ([place]) 12/18/2020   COPD (chronic obstructive pulmonary disease) ([place]) 09/02/2020   Dyslipidemia 09/02/2020   Pulmonary nodule 1 cm or greater in diameter 01/03/2020   S/P TAVR (transcatheter aortic valve replacement) 05/03/2019   Primary osteoarthritis involving multiple joints 05/03/2019   Persistent atrial fibrillation ([place]) 05/03/2019   Bilateral carotid artery stenosis 12/01/2017   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/13/2014   Aortic valve disorder 05/03/2014   Chronic obstructive bronchitis without exacerbation ([place]) 05/03/2014   Derangement of left knee 05/03/2014   Esophageal reflux 05/03/2014   HTN (hypertension) 05/03/2014   Pure hypercholesterolemia 05/03/2014   Vitamin D deficiency 05/03/2014   PCP:  [name], NP Pharmacy:   [id number] [place] [address] [place] [address] Phone: [phone] Fax: [phone]     Social Determinants of Health (SDOH) Interventions    Readmission Risk Interventions Readmission Risk Prevention Plan 09/04/2021 06/08/2021 05/07/2021  Transportation Screening Complete Complete Complete  PCP or Specialist Appt within 3-5 Days Complete - -  HRI or Home Care Consult Complete - -  Social Work Consult for [place] Planning/Counseling Complete - -  Palliative Care Screening Not Applicable - -  Medication Review Press photographer) Complete Complete Complete  PCP or Specialist appointment within 3-5 days of discharge - - Complete  HRI or [place] - Complete Complete  SW Recovery Care/Counseling Consult - Complete -  Palliative Care Screening - Not Applicable Not Applicable  Skilled Nursing Facility - Not Applicable Not Applicable  Some recent data might be hidden

## 2021-09-04 NOTE — Progress Notes (Signed)
[facility] Progress Note  Patient: Tara Fields    ZNB:[id number]  [place]: 09/02/2021    Date of Service: the patient was seen and examined on 09/04/2021  Brief hospital course: 88 year old female past medical history of systolic/diastolic CHF with an ejection fraction of 25 to 30% noted on echocardiogram in May 2022, aortic stenosis status post AVR as well as persistent A. fib on Eliquis who was COVID-positive several months ago (October 2022) who presents presented to the emergency department on 1/18 from her skilled nursing facility with palpitations and shortness of breath.  Patient not hypoxic but found to be in rapid A. fib as well as acute CHF with a BNP of 1300.  Curiously, testing positive for COVID again by PCR in the ED.  Patient started on Cardizem and given dose of Lasix.  Admitted to the hospitalist service.  Assessment and Plan: Atrial fibrillation with rapid ventricular response (HCC) Assessment & Plan Currently rate controlled  S/p Cardizem drip and heparin infusion  Continue metoprolol, Eliquis  HTN (hypertension) Assessment & Plan Stable  Acute on chronic combined systolic and diastolic CHF (congestive heart failure) (HCC) Assessment & Plan      BNP elevated at 1377 Echocardiogram in May 2022 noted EF of 25 to 30% Chest x-ray with interstitial edema  Continue IV Lasix Strict I's and O's, daily weights  Hyperlipidemia associated with type 2 diabetes mellitus (HCC) Assessment & Plan A1c at 6.0, not on any meds at home Continue statins  AKI (acute kidney injury) [facility]) Assessment & Plan Suspect secondary to acute heart failure exacerbation.  Improving with diuresis Daily BMP  Elevated troponin Assessment & Plan Mild elevation, with downward trend Suspect some mild demand ischemia but more likely some elevation due to heart failure and atrial fibrillation  Lab test positive for detection of COVID-19 virus Assessment & Plan Positive COVID test just a  few months ago Given normal CRP level, with no other overwhelming symptoms, no reason to treat at this time, monitor closely    Body mass index is 27.21 kg/m.    Pressure Injury 04/14/21 Sacrum Posterior;Upper Stage 2 -  Partial thickness loss of dermis presenting as a shallow open injury with a red, pink wound bed without slough. (Active)  04/14/21 1000  Location: Sacrum  Location Orientation: Posterior;Upper  Staging: Stage 2 -  Partial thickness loss of dermis presenting as a shallow open injury with a red, pink wound bed without slough.  Wound Description (Comments):   Present on Admission: Yes     Pressure Injury 05/05/21 Sacrum Medial Stage 1 -  Intact skin with non-blanchable redness of a localized area usually over a bony prominence. (Active)  05/05/21 2237  Location: Sacrum  Location Orientation: Medial  Staging: Stage 1 -  Intact skin with non-blanchable redness of a localized area usually over a bony prominence.  Wound Description (Comments):   Present on Admission: Yes     Consultants: None  Procedures: None  Antimicrobials: None  Code Status: DNR   Subjective:  Patient denies any new complaints.  Met son at bedside, discussed plan of care.  Patient still reports some dyspnea upon ambulation, denies any chest pain, abdominal pain, nausea/vomiting, fever/chills.  Objective:  Vitals:   09/04/21 0901 09/04/21 1035  BP: (!) 115/59 (!) 111/56  Pulse: 88 70  Resp:  (!) 21  Temp:  97.9 F (36.6 C)  SpO2:  96%    Intake/Output Summary (Last 24 hours) at 09/04/2021 1801 Last data filed at 09/04/2021 1302  Gross per 24 hour  Intake 240 ml  Output 1350 ml  Net -1110 ml   Filed Weights   09/03/21 0400 09/03/21 0751 09/04/21 0500  Weight: 62.9 kg 64.6 kg 63.2 kg   Body mass index is 27.21 kg/m.  Exam: General: NAD  Cardiovascular: S1, S2 present Respiratory: CTAB Abdomen: Soft, nontender, nondistended, bowel sounds present Musculoskeletal: No  bilateral pedal edema noted Skin: Normal Psychiatry: Normal mood     Disposition:  Status is: Inpatient  Remains inpatient appropriate because: Need for continued diuresis    Family Communication: Discussed with son at bedside DVT Prophylaxis:  apixaban Arne [place]) tablet 5 mg     Author: Alma Friendly ,MD 09/04/2021 6:01 PM  To reach On-call, see care teams to locate the attending and reach out via [email or web address]. Between 7PM-7AM, please contact night-coverage

## 2021-09-04 NOTE — Discharge Instructions (Signed)

## 2021-09-04 NOTE — Evaluation (Signed)
Physical Therapy Evaluation Patient Details Name: Tara Fields MRN: [id number] DOB: 1928/02/27 Today's Date: 09/04/2021  History of Present Illness  Pt is a 88 y.o. female who presented 09/02/21 with SOB, found to be positive for COVID. Pt also in rapid a fib and with CHF exacerbation. PMH: chronic systolic CHF EF 03-54%, h/o TAVr, PAfib on eliquis, COPD, CAD, CKD 3a, aortic stenosis s/p TAVR   Clinical Impression  Pt presents with condition above and deficits mentioned below, see PT Problem List. PTA, she was mod I using a rollator at her ALF, denying any recent falls. Pt displays deficits in lower extremity and core strength and endurance along with deficits in balance and activity tolerance, resulting her having DOE 3/4 (SpO2 levels varying widely with poor waveform) and leaning anteriorly with her forearms resting on her RW when ambulating. She is able to perform all functional mobility at a min guard-supervision level with extra time though. She would benefit from further acute PT and follow-up HHPT to maximize her independence and safety with functional mobility and improve her endurance.       Recommendations for follow up therapy are one component of a multi-disciplinary discharge planning process, led by the attending physician.  Recommendations may be updated based on patient status, additional functional criteria and insurance authorization.  Follow Up Recommendations Home health PT (at her ALF)    Assistance Recommended at Discharge PRN  Patient can return home with the following  Assistance with cooking/housework;Help with stairs or ramp for entrance;Assist for transportation    Equipment Recommendations None recommended by PT  Recommendations for Other Services       Functional Status Assessment Patient has had a recent decline in their functional status and demonstrates the ability to make significant improvements in function in a reasonable and predictable amount of time.      Precautions / Restrictions Precautions Precautions: Fall;Other (comment) Precaution Comments: COVID+; monitor SpO2 Restrictions Weight Bearing Restrictions: No      Mobility  Bed Mobility Overal bed mobility: Modified Independent             General bed mobility comments: Pt able to transition supine > sit R EOB with HOB elevated without assistance.    Transfers Overall transfer level: Needs assistance Equipment used: Rolling walker (2 wheels) Transfers: Sit to/from Stand Sit to Stand: Supervision, Min guard           General transfer comment: Min guard- supervision for safety, no LOB, braces self with legs on bed when coming to stand to manage underwear without UE support.    Ambulation/Gait Ambulation/Gait assistance: Min guard Gait Distance (Feet): 110 Feet Assistive device: Rolling walker (2 wheels) Gait Pattern/deviations: Step-through pattern, Decreased stride length, Trunk flexed Gait velocity: reduced Gait velocity interpretation: <1.31 ft/sec, indicative of household ambulator   General Gait Details: Pt with slow, short steps with forearms resting on RW for support due to fatigue. DOE 3/4, unreliable waveforms to measure SpO2 ranging from 70s-80s%  Stairs            Wheelchair Mobility    Modified Rankin (Stroke Patients Only)       Balance Overall balance assessment: Needs assistance Sitting-balance support: No upper extremity supported, Feet supported Sitting balance-Leahy Scale: Good Sitting balance - Comments: Reaches off BOS to manage shoes and underwear with supervision and extra time.   Standing balance support: Bilateral upper extremity supported, No upper extremity supported, During functional activity Standing balance-Leahy Scale: Fair Standing balance comment: Able to reach off  BOS to pull underwear up without UE support while bracing legs up against EOB. Uses RW for gait.                             Pertinent  Vitals/Pain Pain Assessment Pain Assessment: Faces Pain Score: 0-No pain Pain Intervention(s): Monitored during session    Home Living Family/patient expects to be discharged to:: Assisted living                 Home Equipment: Rollator (4 wheels);Cane - single point;Shower seat;Grab bars - tub/shower;Grab bars - toilet;Hand held shower head      Prior Function Prior Level of Function : Independent/Modified Independent             Mobility Comments: Pt utilizes rollator to perform ADLs, including laundry. Walking to dining room with rollator. Denies any recent falls.       Hand Dominance        Extremity/Trunk Assessment   Upper Extremity Assessment Upper Extremity Assessment: Overall WFL for tasks assessed    Lower Extremity Assessment Lower Extremity Assessment: Generalized weakness (denies numbness/tingling)    Cervical / Trunk Assessment Cervical / Trunk Assessment: Kyphotic  Communication   Communication: No difficulties  Cognition Arousal/Alertness: Awake/alert Behavior During Therapy: WFL for tasks assessed/performed Overall Cognitive Status: Within Functional Limits for tasks assessed                                 General Comments: Pt not following cues on occasion, but appeared to be likely due to being unable to hear PT through mask and shield.        General Comments General comments (skin integrity, edema, etc.): SpO2 varying with poor waveforms 70s-80s% during mobility (unsure of accuracy) but >/= 93% on RA at rest, DOE 3/4    Exercises     Assessment/Plan    PT Assessment Patient needs continued PT services  PT Problem List Decreased strength;Decreased activity tolerance;Decreased range of motion;Decreased mobility;Decreased balance;Cardiopulmonary status limiting activity       PT Treatment Interventions DME instruction;Gait training;Functional mobility training;Therapeutic activities;Therapeutic exercise;Balance  training;Neuromuscular re-education;Patient/family education    PT Goals (Current goals can be found in the Care Plan section)  Acute Rehab PT Goals Patient Stated Goal: to improve PT Goal Formulation: With patient Time For Goal Achievement: 09/18/21 Potential to Achieve Goals: Good    Frequency Min 3X/week     Co-evaluation               AM-PAC PT "6 Clicks" Mobility  Outcome Measure Help needed turning from your back to your side while in a flat bed without using bedrails?: None Help needed moving from lying on your back to sitting on the side of a flat bed without using bedrails?: None Help needed moving to and from a bed to a chair (including a wheelchair)?: A Little Help needed standing up from a chair using your arms (e.g., wheelchair or bedside chair)?: A Little Help needed to walk in hospital room?: A Little Help needed climbing 3-5 steps with a railing? : A Little 6 Click Score: 20    End of Session   Activity Tolerance: Patient limited by fatigue Patient left: in chair;with call bell/phone within reach;with chair alarm set Nurse Communication: Mobility status;Other (comment) (sats) PT Visit Diagnosis: Unsteadiness on feet (R26.81);Other abnormalities of gait and mobility (R26.89);Muscle weakness (generalized) (M62.81);Difficulty  in walking, not elsewhere classified (R26.2)    Time: 8978-4784 PT Time Calculation (min) (ACUTE ONLY): 32 min   Charges:   PT Evaluation $PT Eval Moderate Complexity: 1 Mod PT Treatments $Gait Training: 8-22 mins        Moishe Spice, PT, DPT Acute Rehabilitation Services  Pager: [phone] Office: [phone]   Orvan Falconer 09/04/2021, 12:30 PM

## 2021-09-04 NOTE — Progress Notes (Signed)
Mobility Specialist Progress Note:   09/04/21 1500  Mobility  Activity Ambulated with assistance in room  Level of Assistance Standby assist, set-up cues, supervision of patient - no hands on  Assistive Device Front wheel walker  Distance Ambulated (ft) 90 ft  Activity Response Tolerated well  $Mobility charge 1 Mobility   Pt received in bed willing to participate in mobility. No complaints of pain and asymptomatic. Pt left in bed with call bell in reach and all needs met.   [facility] Public librarian Phone [phone] Secondary Phone [phone]

## 2021-09-05 LAB — BASIC METABOLIC PANEL
Anion gap: 10 (ref 5–15)
BUN: 26 mg/dL — ABNORMAL HIGH (ref 8–23)
CO2: 21 mmol/L — ABNORMAL LOW (ref 22–32)
Calcium: 9 mg/dL (ref 8.9–10.3)
Chloride: 105 mmol/L (ref 98–111)
Creatinine, Ser: 0.9 mg/dL (ref 0.44–1.00)
GFR, Estimated: >60 mL/min (ref >60)
Glucose, Bld: 151 mg/dL — ABNORMAL HIGH (ref 70–99)
Potassium: 3.8 mmol/L (ref 3.5–5.1)
Sodium: 136 mmol/L (ref 135–145)

## 2021-09-05 LAB — CBC
HCT: 38.2 % (ref 36.0–46.0)
Hemoglobin: 12.4 g/dL (ref 12.0–15.0)
MCH: 29.7 pg (ref 26.0–34.0)
MCHC: 32.5 g/dL (ref 30.0–36.0)
MCV: 91.6 fL (ref 80.0–100.0)
Platelets: 231 10*3/uL (ref 150–400)
RBC: 4.17 MIL/uL (ref 3.87–5.11)
RDW: 16.8 % — ABNORMAL HIGH (ref 11.5–15.5)
WBC: 7.4 10*3/uL (ref 4.0–10.5)
nRBC: 0 % (ref 0.0–0.2)

## 2021-09-05 NOTE — Discharge Summary (Signed)
Discharge Summary  Tara Fields BJS:[id number] DOB: April 01, 1928  PCP: [name], NP  Admit date: 09/02/2021 Discharge date: 09/05/2021  Time spent: 40 mins  Recommendations for Outpatient Follow-up:  Follow-up with PCP as scheduled Follow-up with cardiology as scheduled  Discharge Diagnoses:  Active Hospital Problems   Diagnosis Date Noted   Acute on chronic combined systolic and diastolic CHF (congestive heart failure) (HCC)    Leg cramps 09/03/2021   Atrial fibrillation with rapid ventricular response ([place]) 09/02/2021   Elevated troponin 09/02/2021   Lab test positive for detection of COVID-19 virus    AKI (acute kidney injury) ([place])    Hyperlipidemia associated with type 2 diabetes mellitus ([place])    Overweight (BMI 25.0-29.9)    HTN (hypertension) 05/03/2014    Resolved Hospital Problems  No resolved problems to display.    Discharge Condition: Stable  Diet recommendation: Heart healthy  Vitals:   09/05/21 0728 09/05/21 1147  BP: 118/75 123/68  Pulse: 85 68  Resp: 20 15  Temp: (!) 97.5 F (36.4 C) 97.9 F (36.6 C)  SpO2: 93% 96%    History of present illness:  88 year old female past medical history of systolic/diastolic CHF with an ejection fraction of 25 to 30% noted on echocardiogram in May 2022, aortic stenosis status post AVR as well as persistent A. fib on Eliquis who was COVID-positive several months ago (October 2022) who presents presented to the emergency department on 1/18 from her skilled nursing facility with palpitations and shortness of breath.  Patient not hypoxic but found to be in rapid A. fib as well as acute CHF with a BNP of 1300.  Curiously, testing positive for COVID again by PCR in the ED.  Patient started on Cardizem and given dose of Lasix.  Admitted to the hospitalist service.    Today, patient denies any new complaints, denies any chest pain, worsening shortness of breath, abdominal pain, nausea/vomiting, fever/chills.   Discharge plans with son over the phone.    Hospital Course:  Principal Problem:   Acute on chronic combined systolic and diastolic CHF (congestive heart failure) (HCC) Active Problems:   HTN (hypertension)   Atrial fibrillation with rapid ventricular response (HCC)   Lab test positive for detection of COVID-19 virus   AKI (acute kidney injury) ([place])   Hyperlipidemia associated with type 2 diabetes mellitus ([place])   Overweight (BMI 25.0-29.9)   Elevated troponin   Leg cramps   Atrial fibrillation with rapid ventricular response (HCC) Assessment & Plan Currently rate controlled  S/p Cardizem drip and heparin infusion  Continue metoprolol, Eliquis   HTN (hypertension) Assessment & Plan Stable   Acute on chronic combined systolic and diastolic CHF (congestive heart failure) (HCC) Assessment & Plan      BNP elevated at 1377 Echocardiogram in May 2022 noted EF of 25 to 30% Chest x-ray with interstitial edema  S/p IV Lasix, net neg 1.2 L Continue home torsemide   Hyperlipidemia associated with type 2 diabetes mellitus (HCC) Assessment & Plan A1c at 6.0, not on any meds at home Continue statins   AKI (acute kidney injury) ([place]) Assessment & Plan Suspect secondary to acute heart failure exacerbation Improved with diuresis   Elevated troponin Assessment & Plan Mild elevation, with downward trend Suspect some mild demand ischemia but more likely some elevation due to heart failure and atrial fibrillation   Lab test positive for detection of COVID-19 virus Assessment & Plan Positive COVID test just a few months ago Given normal CRP level, with  no other overwhelming symptoms, no reason to treat at this time, monitor closely Follow-up with PCP       Body mass index is 27.21 kg/m.  Pressure Injury 04/14/21 Sacrum Posterior;Upper Stage 2 -  Partial thickness loss of dermis presenting as a shallow open injury with a red, pink wound bed without slough. (Active)  04/14/21  1000  Location: Sacrum  Location Orientation: Posterior;Upper  Staging: Stage 2 -  Partial thickness loss of dermis presenting as a shallow open injury with a red, pink wound bed without slough.  Wound Description (Comments):   Present on Admission: Yes     Pressure Injury 05/05/21 Sacrum Medial Stage 1 -  Intact skin with non-blanchable redness of a localized area usually over a bony prominence. (Active)  05/05/21 2237  Location: Sacrum  Location Orientation: Medial  Staging: Stage 1 -  Intact skin with non-blanchable redness of a localized area usually over a bony prominence.  Wound Description (Comments):   Present on Admission: Yes     Estimated body mass index is 26.82 kg/m as calculated from the following:   Height as of this encounter: 5' (1.524 m).   Weight as of this encounter: 62.3 kg.    Procedures: None  Consultations: None  Discharge Exam: BP 123/68 (BP Location: Left Arm)    Pulse 68    Temp 97.9 F (36.6 C) (Oral)    Resp 15    Ht 5' (1.524 m)    Wt 62.3 kg    SpO2 96%    BMI 26.82 kg/m   General: NAD  Cardiovascular: S1, S2 present Respiratory: CTAB Abdomen: Soft, nontender, nondistended, bowel sounds present Musculoskeletal: No bilateral pedal edema noted, noted bruising on the right foot Skin: Normal Psychiatry: Normal mood   Discharge Instructions You were cared for by a hospitalist during your hospital stay. If you have any questions about your discharge medications or the care you received while you were in the hospital after you are discharged, you can call the unit and asked to speak with the hospitalist on call if the hospitalist that took care of you is not available. Once you are discharged, your primary care physician will handle any further medical issues. Please note that NO REFILLS for any discharge medications will be authorized once you are discharged, as it is imperative that you return to your primary care physician (or establish a  relationship with a primary care physician if you do not have one) for your aftercare needs so that they can reassess your need for medications and monitor your lab values.   Allergies as of 09/05/2021       Reactions   Amlodipine Besy-benazepril Hcl Cough   Hctz [hydrochlorothiazide] Other (See Comments)   Per family, every diuretic the patient has been on flushes out her out and ultimately results in CRAMPING/JAUNDICE (added potassium might help)   Other Other (See Comments)   History of diverticulitis- CANNOT TOLERATE NUTS, CORN, AND ANY FOODS NOT EASILY DIGESTED- the patient AVOIDS these   Cyclobenzaprine Other (See Comments)   Confusion- does not tolerate well    Diclofenac Sodium Diarrhea   Hydralazine Other (See Comments)   Per family, every diuretic the patient has been on flushes out her out and ultimately results in CRAMPING/JAUNDICE (added potassium might help)   Methylprednisolone Other (See Comments)   Medrol/CORTICOSTEROIDS = A LOT OF ENERGY   Oxycodone Other (See Comments)   Caused patient to "act crazy"  Medication List     STOP taking these medications    furosemide 20 MG tablet Commonly known as: LASIX   metoprolol tartrate 50 MG tablet Commonly known as: LOPRESSOR       TAKE these medications    acetaminophen 500 MG tablet Commonly known as: TYLENOL Take 500 mg by mouth 3 (three) times daily as needed for headache (pain). Take with tramadol   albuterol 108 (90 Base) MCG/ACT inhaler Commonly known as: VENTOLIN HFA Inhale 2 puffs into the lungs every 6 (six) hours as needed for wheezing or shortness of breath.   atorvastatin 40 MG tablet Commonly known as: LIPITOR TAKE 1 TABLET BY MOUTH DAILY AT 6 PM. What changed: when to take this   cholecalciferol 25 MCG (1000 UNIT) tablet Commonly known as: VITAMIN D3 Take 1,000 Units by mouth 2 (two) times daily.   Co Q-10 300 MG Caps Take 300 mg by mouth in the morning.   [facility] Aspirin Adult Low  Dose 81 MG chewable tablet Generic drug: aspirin CHEW 1 TABLET (81 MG TOTAL) BY MOUTH DAILY. What changed: how much to take   Eliquis 5 MG Tabs tablet Generic drug: apixaban TAKE 1 TABLET BY MOUTH TWICE A DAY What changed: how much to take   ferrous gluconate 324 MG tablet Commonly known as: FERGON TAKE 1 TABLET BY MOUTH DAILY WITH BREAKFAST   HM LIDOCAINE PATCH EX Apply 1 patch topically daily as needed (for back pain).   Lidocaine-Menthol (Spray) 4-1 % Liqd Apply 1 spray topically daily as needed (back/knee pain/restless legs).   losartan 25 MG tablet Commonly known as: COZAAR Take 1 tablet (25 mg total) by mouth daily.   Magnesium 200 MG Tabs Take 200 mg by mouth every morning.   metoprolol succinate 100 MG 24 hr tablet Commonly known as: TOPROL-XL Take 1 tablet (100 mg total) by mouth daily. Take with or immediately following a meal.   multivitamin with minerals Tabs tablet Take 1 tablet by mouth 2 (two) times daily. Centrum Minis   potassium chloride 20 MEQ/15ML (10%) Soln Take 15 mLs (20 mEq total) by mouth daily.   sodium chloride 0.65 % Soln nasal spray Commonly known as: OCEAN Place 1 spray into both nostrils daily as needed for congestion.   Spacer/Aero-Holding [employer] Use with inhaler   terazosin 10 MG capsule Commonly known as: HYTRIN TAKE 1 CAPSULE BY MOUTH EVERY DAY AT 12 NOON What changed: See the new instructions.   torsemide 20 MG tablet Commonly known as: DEMADEX Take 2 tablets (40 mg total) by mouth daily.   traMADol 50 MG tablet Commonly known as: ULTRAM TAKE 1 TABLET (50 MG TOTAL) BY MOUTH IN THE MORNING, AT NOON, AND AT BEDTIME. What changed: See the new instructions.   vitamin B-12 1000 MCG tablet Commonly known as: CYANOCOBALAMIN Take 1 tablet (1,000 mcg total) by mouth daily.       Allergies  Allergen Reactions   Amlodipine Besy-Benazepril Hcl Cough   Hctz [Hydrochlorothiazide] Other (See Comments)    Per family,  every diuretic the patient has been on flushes out her out and ultimately results in CRAMPING/JAUNDICE (added potassium might help)   Other Other (See Comments)    History of diverticulitis- CANNOT TOLERATE NUTS, CORN, AND ANY FOODS NOT EASILY DIGESTED- the patient AVOIDS these   Cyclobenzaprine Other (See Comments)    Confusion- does not tolerate well    Diclofenac Sodium Diarrhea   Hydralazine Other (See Comments)    Per family, every  diuretic the patient has been on flushes out her out and ultimately results in CRAMPING/JAUNDICE (added potassium might help)   Methylprednisolone Other (See Comments)    Medrol/CORTICOSTEROIDS = A LOT OF ENERGY   Oxycodone Other (See Comments)    Caused patient to "act crazy"    Follow-up Information     [name], NP. Go on 09/16/2021.   Specialty: Family Medicine Why: @1 :00pm Contact information: [address] [place] [address] [phone]         Burnell Blanks, MD. Go on 09/16/2021.   Specialty: Cardiology Why: At [facility] information: [place]. [address] [place] [address] [phone]         [facility] Follow up.   Why: HHRN,HHPT, [facility] will contact legal gurardian regarding apt times.                 The results of significant diagnostics from this hospitalization (including imaging, microbiology, ancillary and laboratory) are listed below for reference.    Significant Diagnostic Studies: DG Chest Port 1 View  Result Date: 09/02/2021 CLINICAL DATA:  Short of breath. EXAM: PORTABLE CHEST 1 VIEW COMPARISON:  06/19/2021 FINDINGS: Cardiac enlargement. Status post TAVR. Aortic atherosclerosis. Diffuse coarsened interstitial markings are identified throughout both lungs compatible with emphysema. Post treatment changes within the perihilar left upper lobe are again noted increase peripheral interstitial markings identified concerning for mild interstitial edema. New  opacity within the periphery of the left base may represent atelectasis or pneumonia. Bones appear diffusely osteopenic. IMPRESSION: 1. New opacity within the periphery of the left base may represent atelectasis/pneumonia. 2. New mild interstitial edema. Correlate for any clinical signs or symptoms of CHF. 3. Emphysema. Electronically Signed   By: Kerby Moors M.D.   On: 09/02/2021 06:16    Microbiology: Recent Results (from the past 240 hour(s))  Resp Panel by RT-PCR (Flu A&B, Covid) Nasopharyngeal Swab     Status: Abnormal   Collection Time: 09/02/21  5:44 AM   Specimen: Nasopharyngeal Swab; Nasopharyngeal(NP) swabs in vial transport medium  Result Value Ref Range Status   SARS Coronavirus 2 by RT PCR POSITIVE (A) NEGATIVE Final    Comment: (NOTE) SARS-CoV-2 target nucleic acids are DETECTED.  The SARS-CoV-2 RNA is generally detectable in upper respiratory specimens during the acute phase of infection. Positive results are indicative of the presence of the identified virus, but do not rule out bacterial infection or co-infection with other pathogens not detected by the test. Clinical correlation with patient history and other diagnostic information is necessary to determine patient infection status. The expected result is Negative.  Fact Sheet for Patients: EntrepreneurPulse.com.au  Fact Sheet for Healthcare Providers: IncredibleEmployment.be  This test is not yet approved or cleared by the [place] FDA and  has been authorized for detection and/or diagnosis of SARS-CoV-2 by FDA under an Emergency Use Authorization (EUA).  This EUA will remain in effect (meaning this test can be used) for the duration of  the COVID-19 declaration under Section 564(b)(1) of the A ct, 21 U.S.C. section 360bbb-3(b)(1), unless the authorization is terminated or revoked sooner.     Influenza A by PCR NEGATIVE NEGATIVE Final   Influenza B by PCR NEGATIVE  NEGATIVE Final    Comment: (NOTE) The Xpert Xpress SARS-CoV-2/FLU/RSV plus assay is intended as an aid in the diagnosis of influenza from Nasopharyngeal swab specimens and should not be used as a sole basis for treatment. Nasal washings and aspirates are  unacceptable for Xpert Xpress SARS-CoV-2/FLU/RSV testing.  Fact Sheet for Patients: EntrepreneurPulse.com.au  Fact Sheet for Healthcare Providers: IncredibleEmployment.be  This test is not yet approved or cleared by the [place] FDA and has been authorized for detection and/or diagnosis of SARS-CoV-2 by FDA under an Emergency Use Authorization (EUA). This EUA will remain in effect (meaning this test can be used) for the duration of the COVID-19 declaration under Section 564(b)(1) of the Act, 21 U.S.C. section 360bbb-3(b)(1), unless the authorization is terminated or revoked.  Performed at [facility], [address], [place], [place] [address]      Labs: Basic Metabolic Panel: Recent Labs  Lab 09/02/21 0536 09/03/21 0352 09/04/21 0402 09/05/21 0349  NA 139 140 138 136  K 4.6 4.2 3.7 3.8  CL 102 106 107 105  CO2 24 23 21* 21*  GLUCOSE 214* 154* 150* 151*  BUN 35* 24* 23 26*  CREATININE 1.12* 0.99 0.88 0.90  CALCIUM 9.8 8.9 9.0 9.0   Liver Function Tests: Recent Labs  Lab 09/02/21 0536  AST 23  ALT 17  ALKPHOS 66  BILITOT 1.1  PROT 6.9  ALBUMIN 4.0   No results for input(s): LIPASE, AMYLASE in the last 168 hours. No results for input(s): AMMONIA in the last 168 hours. CBC: Recent Labs  Lab 09/02/21 0536 09/03/21 0352 09/04/21 0402 09/05/21 0349  WBC 7.4 6.1 7.5 7.4  NEUTROABS 6.2  --   --   --   HGB 12.0 10.8* 11.5* 12.4  HCT 38.6 33.8* 35.6* 38.2  MCV 93.2 92.9 92.5 91.6  PLT 228 193 222 231   Cardiac Enzymes: No results for input(s): CKTOTAL, CKMB, CKMBINDEX, TROPONINI in the last 168 hours. BNP: BNP (last 3 results) Recent Labs     05/08/21 0234 06/07/21 0955 09/02/21 0536  BNP 1,904.4* 3,284.2* 1,377.5*    ProBNP (last 3 results) No results for input(s): PROBNP in the last 8760 hours.  CBG: No results for input(s): GLUCAP in the last 168 hours.     Signed:  Alma Friendly, MD [facility] 09/05/2021, 12:14 PM

## 2021-09-05 NOTE — Progress Notes (Signed)
Patient is continually removing telemetry box despite redirection and education. Patient is alert to person and place and verbalizes that she is to be discharged in the AM per communication from MD, but she will not leave telemetry in place. Tele placed on standby due to noncompliance

## 2021-09-07 ENCOUNTER — Telehealth: Payer: Self-pay

## 2021-09-07 NOTE — Telephone Encounter (Signed)
Transition Care Management Follow-up Telephone Call Date of discharge and from where: [facility] 09-05-21 Dx: CHF  How have you been since you were released from the hospital? fine Any questions or concerns? No  Items Reviewed: Did the pt receive and understand the discharge instructions provided? Yes  Medications obtained and verified? Yes  Other? No  Any new allergies since your discharge? No  Dietary orders reviewed? Yes Do you have support at home? Yes   Home Care and Equipment/Supplies: Were home health services ordered? yes If so, what is the name of the agency? [facility]   Has the agency set up a time to come to the patient's home? yes Were any new equipment or medical supplies ordered?  No What is the name of the medical supply agency? [facility] Were you able to get the supplies/equipment? not applicable Do you have any questions related to the use of the equipment or supplies? No  Functional Questionnaire: (I = Independent and D = Dependent) ADLs: I  Bathing/Dressing- I  Meal Prep- I  Eating- I  Maintaining continence- I  Transferring/Ambulation- I  Managing Meds- D  Follow up appointments reviewed:  PCP Hospital f/u appt confirmed? Yes  Scheduled to see [name] on 09-16-21 @ [facility] f/u appt confirmed? Yes- Dr Angelena Form 09-16-21 at [facility] Are transportation arrangements needed? No  If their condition worsens, is the pt aware to call PCP or go to the Emergency Dept.? Yes Was the patient provided with contact information for the PCP's office or ED? Yes Was to pt encouraged to call back with questions or concerns? Yes

## 2021-09-09 ENCOUNTER — Other Ambulatory Visit: Payer: Self-pay | Admitting: Adult Health

## 2021-09-16 ENCOUNTER — Ambulatory Visit (INDEPENDENT_AMBULATORY_CARE_PROVIDER_SITE_OTHER): Payer: Medicare Other | Admitting: Cardiovascular Disease

## 2021-09-16 ENCOUNTER — Other Ambulatory Visit: Payer: Self-pay

## 2021-09-16 ENCOUNTER — Encounter: Payer: Self-pay | Admitting: Cardiovascular Disease

## 2021-09-16 ENCOUNTER — Inpatient Hospital Stay: Payer: Medicare Other | Admitting: Adult Health

## 2021-09-16 VITALS — BP 122/58 | HR 75 | Ht 60.0 in | Wt 144.0 lb

## 2021-09-16 DIAGNOSIS — I10 Essential (primary) hypertension: Secondary | ICD-10-CM

## 2021-09-16 DIAGNOSIS — I6523 Occlusion and stenosis of bilateral carotid arteries: Secondary | ICD-10-CM

## 2021-09-16 DIAGNOSIS — I480 Paroxysmal atrial fibrillation: Secondary | ICD-10-CM

## 2021-09-16 DIAGNOSIS — I5022 Chronic systolic (congestive) heart failure: Secondary | ICD-10-CM | POA: Diagnosis not present

## 2021-09-16 DIAGNOSIS — I2510 Atherosclerotic heart disease of native coronary artery without angina pectoris: Secondary | ICD-10-CM

## 2021-09-16 DIAGNOSIS — I350 Nonrheumatic aortic (valve) stenosis: Secondary | ICD-10-CM | POA: Diagnosis not present

## 2021-09-16 NOTE — Patient Instructions (Signed)
Medication Instructions:  Your physician recommends that you continue on your current medications as directed. Please refer to the Current Medication list given to you today.  *If you need a refill on your cardiac medications before your next appointment, please call your pharmacy*   Lab Work: None ordered   If you have labs (blood work) drawn today and your tests are completely normal, you will receive your results only by: [place] (if you have MyChart) OR A paper copy in the mail If you have any lab test that is abnormal or we need to change your treatment, we will call you to review the results.   Testing/Procedures: None ordered    Follow-Up: At [facility], you and your health needs are our priority.  As part of our continuing mission to provide you with exceptional heart care, we have created designated Provider Care Teams.  These Care Teams include your primary Cardiologist (physician) and Advanced Practice Providers (APPs -  Physician Assistants and Nurse Practitioners) who all work together to provide you with the care you need, when you need it.  We recommend signing up for the patient portal called "MyChart".  Sign up information is provided on this After Visit Summary.  MyChart is used to connect with patients for Virtual Visits (Telemedicine).  Patients are able to view lab/test results, encounter notes, upcoming appointments, etc.  Non-urgent messages can be sent to your provider as well.   To learn more about what you can do with MyChart, go to [email or web address].    Your next appointment:   6 month(s)  The format for your next appointment:   In Person  Provider:   Lauree Chandler, MD     Other Instructions None

## 2021-09-16 NOTE — Progress Notes (Signed)
Chief Complaint  Patient presents with   Follow-up    Aortic stenosis, CHF, atrial fibrillation   History of Present Illness: 88 yo female with history of severe AS s/p TAVR at [place], COPD, CAD, GERD, persistent atrial fibrillation, DM, HTN, HLD, tobacco abuse and diverticulitis here today for follow up. I saw her remotely in 2016. She re-established in my office January 2022. She underwent TAVR at [facility] 03/07/19 with placement of a 26 mm Sapien Ultra THV from the right femoral artery approach. She developed a pseudoaneurysm post TAVR and had thrombin injection. She has atrial fibrillation and is on Eliquis. She has carotid artery disease. She tells me that no stent was placed in her carotid artery. Cardiac cath March 2019 at [place] with 25% mid LAD stenosis, 50% stenosis second diagonal branch, 50% distal Circumflex stenosis, 75% ostial stenosis of a small Diagonal branch. Echo September 2020 at [place] with LVEF=45-50%. Moderate MR. Bioprosthetic aortic valve working well. Repeat echo at [facility] July 2021 with no change, Trivial perivalvular leak noted. She was admitted to [facility] 12/19/20 with acute systolic CHF. Echo 12/20/20 with LVEF=25-30%. No ischemic evaluation was planned per discussion with Dr. Percival Spanish and the patient. She was diuresed with IV Lasix. Norvasc was stopped. She was readmitted with CHF June 2022 after being treated with IV Lasix. She was doing well on office follow up in August 2022 but readmitted September 2022 with CHF. She was diuresed and discharged home on Torsemide. She was readmitted October 2022 with Covid and was diuresed. She was readmitted January 2023 with rapid atrial fib. Rate controlled with IV Cardizem. Mild troponin elevation due to demand ischemia.   She is here today for follow up. The patient denies any chest pain, dyspnea, palpitations, lower extremity edema, orthopnea, PND, dizziness, near syncope or syncope. She is feeling well today. Weight stable at home.   Primary  Care Physician: [name], NP  Past Medical History:  Diagnosis Date   Aortic stenosis    s/p TAVR 26 mm Edwards Sapien 3 THV July 2020   CAD (coronary artery disease)    COPD (chronic obstructive pulmonary disease) (HCC)    Diabetes mellitus (HCC)    Diverticulitis    GERD (gastroesophageal reflux disease)    HTN (hypertension)    Hyperlipidemia    Legally blind in left eye, as defined in [place]    Tobacco abuse     Past Surgical History:  Procedure Laterality Date   ABDOMINAL HYSTERECTOMY     APPENDECTOMY     BACK SURGERY     CATARACT EXTRACTION, BILATERAL     CHOLECYSTECTOMY     COLONOSCOPY WITH PROPOFOL N/A 04/16/2021   Procedure: COLONOSCOPY WITH PROPOFOL;  Surgeon: Milus Banister, MD;  Location: [facility];  Service: Gastroenterology;  Laterality: N/A;   ESOPHAGOGASTRODUODENOSCOPY (EGD) WITH PROPOFOL N/A 04/15/2021   Procedure: ESOPHAGOGASTRODUODENOSCOPY (EGD) WITH PROPOFOL;  Surgeon: Doran Stabler, MD;  Location: [facility];  Service: Gastroenterology;  Laterality: N/A;    Current Outpatient Medications  Medication Sig Dispense Refill   acetaminophen (TYLENOL) 500 MG tablet Take 500 mg by mouth 3 (three) times daily as needed for headache (pain). Take with tramadol     albuterol (VENTOLIN HFA) 108 (90 Base) MCG/ACT inhaler Inhale 2 puffs into the lungs every 6 (six) hours as needed for wheezing or shortness of breath. 8 g 0   atorvastatin (LIPITOR) 40 MG tablet TAKE 1 TABLET BY MOUTH DAILY AT 6 PM. 90 tablet 1  cholecalciferol (VITAMIN D3) 25 MCG (1000 UNIT) tablet Take 1,000 Units by mouth 2 (two) times daily.     Coenzyme Q10 (CO Q-10) 300 MG CAPS Take 300 mg by mouth in the morning.     CVS ASPIRIN ADULT LOW DOSE 81 MG chewable tablet CHEW 1 TABLET (81 MG TOTAL) BY MOUTH DAILY. 90 tablet 4   ELIQUIS 5 MG TABS tablet TAKE 1 TABLET BY MOUTH TWICE A DAY (Patient taking differently: Take 5 mg by mouth 2 (two) times daily.) 180 tablet 1   fenofibrate 160 MG  tablet      Ferrous Gluconate 324 (37.5 Fe) MG TABS Take 1 tablet (324 mg total) by mouth daily with breakfast. 90 tablet 3   HM LIDOCAINE PATCH EX Apply 1 patch topically daily as needed (for back pain).     Lidocaine-Menthol, Spray, 4-1 % LIQD Apply 1 spray topically daily as needed (back/knee pain/restless legs).     losartan (COZAAR) 25 MG tablet Take 1 tablet (25 mg total) by mouth daily. 90 tablet 3   Magnesium 200 MG TABS Take 200 mg by mouth every morning.     metoprolol succinate (TOPROL-XL) 100 MG 24 hr tablet Take 1 tablet (100 mg total) by mouth daily. Take with or immediately following a meal. 90 tablet 1   Multiple Vitamin (MULTIVITAMIN WITH MINERALS) TABS tablet Take 1 tablet by mouth 2 (two) times daily. Centrum Minis     pantoprazole (PROTONIX) 40 MG tablet      sodium chloride (OCEAN) 0.65 % SOLN nasal spray Place 1 spray into both nostrils daily as needed for congestion.     Spacer/Aero-Holding Dorise Bullion Use with inhaler 1 each 0   terazosin (HYTRIN) 10 MG capsule TAKE 1 CAPSULE BY MOUTH EVERY DAY AT 12 NOON (Patient taking differently: Take 10 mg by mouth at bedtime.) 90 capsule 1   torsemide (DEMADEX) 20 MG tablet Take 2 tablets (40 mg total) by mouth daily. 180 tablet 3   traMADol (ULTRAM) 50 MG tablet TAKE 1 TABLET (50 MG TOTAL) BY MOUTH IN THE MORNING, AT NOON, AND AT BEDTIME. (Patient taking differently: Take 50 mg by mouth 3 (three) times daily.) 90 tablet 2   vitamin B-12 (CYANOCOBALAMIN) 1000 MCG tablet Take 1 tablet (1,000 mcg total) by mouth daily. 30 tablet 3   dexamethasone (DECADRON) 6 MG tablet  (Patient not taking: Reported on 09/07/2021)     potassium chloride 20 MEQ/15ML (10%) SOLN Take 15 mLs (20 mEq total) by mouth daily. 1350 mL 3   No current facility-administered medications for this visit.    Allergies  Allergen Reactions   Amlodipine Besy-Benazepril Hcl Cough   Hctz [Hydrochlorothiazide] Other (See Comments)    Per family, every diuretic the  patient has been on flushes out her out and ultimately results in CRAMPING/JAUNDICE (added potassium might help)   Other Other (See Comments)    History of diverticulitis- CANNOT TOLERATE NUTS, CORN, AND ANY FOODS NOT EASILY DIGESTED- the patient AVOIDS these   Cyclobenzaprine Other (See Comments)    Confusion- does not tolerate well    Diclofenac Sodium Diarrhea   Hydralazine Other (See Comments)    Per family, every diuretic the patient has been on flushes out her out and ultimately results in CRAMPING/JAUNDICE (added potassium might help)   Methylprednisolone Other (See Comments)    Medrol/CORTICOSTEROIDS = A LOT OF ENERGY   Oxycodone Other (See Comments)    Caused patient to "act crazy"    Social History  Socioeconomic History   Marital status: Widowed    Spouse name: Not on file   Number of children: 2   Years of education: Not on file   Highest education level: High school graduate  Occupational History   Occupation: Retired    Comment: homemaker  Tobacco Use   Smoking status: Former    Packs/day: 1.00    Years: 70.00    Pack years: 70.00    Types: Cigarettes    Quit date: 12/15/2019    Years since quitting: 1.7   Smokeless tobacco: Never   Tobacco comments:    Pt states quiting a year ago. 01/03/20 ARJ  Vaping Use   Vaping Use: Never used  Substance and Sexual Activity   Alcohol use: No    Alcohol/week: 0.0 standard drinks   Drug use: No   Sexual activity: Not Currently  Other Topics Concern   Not on file  Social History Narrative   Widowed- was married for 49 years    Has two sons       Social Determinants of Radio broadcast assistant Strain: Low Risk    Difficulty of Paying Living Expenses: Not hard at all  Food Insecurity: No Food Insecurity   Worried About Charity fundraiser in the Last Year: Never true   Arboriculturist in the Last Year: Never true  Transportation Needs: No Transportation Needs   Lack of Transportation (Medical): No   Lack of  Transportation (Non-Medical): No  Physical Activity: Insufficiently Active   Days of Exercise per Week: 5 days   Minutes of Exercise per Session: 10 min  Stress: No Stress Concern Present   Feeling of Stress : Not at all  Social Connections: Socially Isolated   Frequency of Communication with Friends and Family: Three times a week   Frequency of Social Gatherings with Friends and Family: Three times a week   Attends Religious Services: Never   Active Member of Clubs or Organizations: No   Attends Archivist Meetings: Never   Marital Status: Widowed  Human resources officer Violence: Not At Risk   Fear of Current or Ex-Partner: No   Emotionally Abused: No   Physically Abused: No   Sexually Abused: No    Family History  Problem Relation Age of Onset   Diabetes Mother    Stroke Brother    Cerebral palsy Brother    Coronary artery disease Neg Hx     Review of Systems:  As stated in the HPI and otherwise negative.   BP (!) 122/58    Pulse 75    Ht 5' (1.524 m)    Wt 144 lb (65.3 kg)    SpO2 96%    BMI 28.12 kg/m   Physical Examination: General: Well developed, well nourished, NAD  HEENT: OP clear, mucus membranes moist  SKIN: warm, dry. No rashes. Neuro: No focal deficits  Musculoskeletal: Muscle strength 5/5 all ext  Psychiatric: Mood and affect normal  Neck: No JVD, no carotid bruits, no thyromegaly, no lymphadenopathy.  Lungs:Clear bilaterally, no wheezes, rhonci, crackles Cardiovascular: Regular rate and rhythm. No murmurs, gallops or rubs. Abdomen:Soft. Bowel sounds present. Non-tender.  Extremities: No lower extremity edema. Pulses are 2 + in the bilateral DP/PT.  EKG:  EKG is not ordered today. The ekg ordered today demonstrates   Recent Labs: 06/07/2021: TSH 0.692 06/11/2021: Magnesium 2.2 09/02/2021: ALT 17; B Natriuretic Peptide 1,377.5 09/05/2021: BUN 26; Creatinine, Ser 0.90; Hemoglobin 12.4; Platelets 231; Potassium 3.8;  Sodium 136   Lipid Panel     Component Value Date/Time   CHOL 94 05/08/2021 0234   TRIG 57 05/08/2021 0234   HDL 34 (L) 05/08/2021 0234   CHOLHDL 2.8 05/08/2021 0234   VLDL 11 05/08/2021 0234   LDLCALC 49 05/08/2021 0234     Wt Readings from Last 3 Encounters:  09/16/21 144 lb (65.3 kg)  09/05/21 137 lb 5.6 oz (62.3 kg)  06/19/21 143 lb (64.9 kg)     Other studies Reviewed: Additional studies/ records that were reviewed today include:  Review of the above records demonstrates:    Assessment and Plan:   1. Severe aortic stenosis s/p TAVR: F/u at [facility] July [facility] with Echo showing LVEF=45-50% , unchanged but most recent echo May 2022 at [facility] with LVEF=25-30%. No ischemic evaluation given advanced age. We have discussed this and she does not wish to have any ischemic evaluation. Will continue ASA, Eliquis and SBE prophylaxis.    2. CAD without angina: No chest pain. Mild to moderate CAD by cath in 2019. Drop in LVEF noted on echo May 2022 with LVEF=25-30%. Given her age, we will not plan ischemic testing in the future. Will continue ASA, statin and beta blocker. LDL 49 in Sept 2022.  3. Paroxysmal atrial fibrillation: Sinus today on exam. Continue beta blocker and Eliquis.    4. HTN: BP is well controlled. Continue current therapy  5. Carotid artery disease: Notes indicate right carotid artery stenting. She denies this. No plans for further surveillance given age.   6. Tobacco abuse: She has stopped smoking  7. Chronic systolic CHF: Weight stable. No LE edema. Continue Torsemide and Potassium  Current medicines are reviewed at length with the patient today.  The patient does not have concerns regarding medicines.  The following changes have been made:  no change  Labs/ tests ordered today include:   No orders of the defined types were placed in this encounter.  Disposition:   F/U with me or office APP in 6 months.   Signed, Lauree Chandler, MD 09/16/2021 9:42 AM    [facility]  [facility] [address], [place], [place]  [address] Phone: [phone]; Fax: [phone]

## 2021-10-24 ENCOUNTER — Other Ambulatory Visit: Payer: Self-pay | Admitting: Adult Health

## 2021-11-07 ENCOUNTER — Other Ambulatory Visit: Payer: Self-pay

## 2021-11-07 DIAGNOSIS — Z23 Encounter for immunization: Secondary | ICD-10-CM

## 2021-11-07 DIAGNOSIS — I4821 Permanent atrial fibrillation: Secondary | ICD-10-CM | POA: Diagnosis present

## 2021-11-07 DIAGNOSIS — Z953 Presence of xenogenic heart valve: Secondary | ICD-10-CM

## 2021-11-07 DIAGNOSIS — Z888 Allergy status to other drugs, medicaments and biological substances status: Secondary | ICD-10-CM

## 2021-11-07 DIAGNOSIS — Z90710 Acquired absence of both cervix and uterus: Secondary | ICD-10-CM

## 2021-11-07 DIAGNOSIS — Z823 Family history of stroke: Secondary | ICD-10-CM

## 2021-11-07 DIAGNOSIS — I429 Cardiomyopathy, unspecified: Secondary | ICD-10-CM | POA: Diagnosis present

## 2021-11-07 DIAGNOSIS — E876 Hypokalemia: Secondary | ICD-10-CM | POA: Diagnosis present

## 2021-11-07 DIAGNOSIS — K219 Gastro-esophageal reflux disease without esophagitis: Secondary | ICD-10-CM | POA: Diagnosis present

## 2021-11-07 DIAGNOSIS — I452 Bifascicular block: Secondary | ICD-10-CM | POA: Diagnosis present

## 2021-11-07 DIAGNOSIS — I5023 Acute on chronic systolic (congestive) heart failure: Secondary | ICD-10-CM | POA: Diagnosis present

## 2021-11-07 DIAGNOSIS — J189 Pneumonia, unspecified organism: Secondary | ICD-10-CM | POA: Diagnosis present

## 2021-11-07 DIAGNOSIS — Z7901 Long term (current) use of anticoagulants: Secondary | ICD-10-CM

## 2021-11-07 DIAGNOSIS — I130 Hypertensive heart and chronic kidney disease with heart failure and stage 1 through stage 4 chronic kidney disease, or unspecified chronic kidney disease: Secondary | ICD-10-CM | POA: Diagnosis not present

## 2021-11-07 DIAGNOSIS — Z6827 Body mass index (BMI) 27.0-27.9, adult: Secondary | ICD-10-CM

## 2021-11-07 DIAGNOSIS — Z91119 Patient's noncompliance with dietary regimen due to unspecified reason: Secondary | ICD-10-CM

## 2021-11-07 DIAGNOSIS — F05 Delirium due to known physiological condition: Secondary | ICD-10-CM | POA: Diagnosis present

## 2021-11-07 DIAGNOSIS — J9601 Acute respiratory failure with hypoxia: Secondary | ICD-10-CM | POA: Diagnosis not present

## 2021-11-07 DIAGNOSIS — E1122 Type 2 diabetes mellitus with diabetic chronic kidney disease: Secondary | ICD-10-CM | POA: Diagnosis present

## 2021-11-07 DIAGNOSIS — Z7952 Long term (current) use of systemic steroids: Secondary | ICD-10-CM

## 2021-11-07 DIAGNOSIS — E7800 Pure hypercholesterolemia, unspecified: Secondary | ICD-10-CM | POA: Diagnosis present

## 2021-11-07 DIAGNOSIS — Z7982 Long term (current) use of aspirin: Secondary | ICD-10-CM

## 2021-11-07 DIAGNOSIS — Z66 Do not resuscitate: Secondary | ICD-10-CM | POA: Diagnosis present

## 2021-11-07 DIAGNOSIS — N390 Urinary tract infection, site not specified: Secondary | ICD-10-CM | POA: Diagnosis present

## 2021-11-07 DIAGNOSIS — J440 Chronic obstructive pulmonary disease with acute lower respiratory infection: Secondary | ICD-10-CM | POA: Diagnosis present

## 2021-11-07 DIAGNOSIS — F419 Anxiety disorder, unspecified: Secondary | ICD-10-CM | POA: Diagnosis present

## 2021-11-07 DIAGNOSIS — Z79899 Other long term (current) drug therapy: Secondary | ICD-10-CM

## 2021-11-07 DIAGNOSIS — N1831 Chronic kidney disease, stage 3a: Secondary | ICD-10-CM | POA: Diagnosis present

## 2021-11-07 DIAGNOSIS — Z87891 Personal history of nicotine dependence: Secondary | ICD-10-CM

## 2021-11-07 DIAGNOSIS — N179 Acute kidney failure, unspecified: Secondary | ICD-10-CM | POA: Diagnosis present

## 2021-11-07 DIAGNOSIS — Z820 Family history of epilepsy and other diseases of the nervous system: Secondary | ICD-10-CM

## 2021-11-07 DIAGNOSIS — E440 Moderate protein-calorie malnutrition: Secondary | ICD-10-CM | POA: Diagnosis present

## 2021-11-07 DIAGNOSIS — R54 Age-related physical debility: Secondary | ICD-10-CM | POA: Diagnosis present

## 2021-11-07 DIAGNOSIS — D509 Iron deficiency anemia, unspecified: Secondary | ICD-10-CM | POA: Diagnosis present

## 2021-11-07 DIAGNOSIS — Z885 Allergy status to narcotic agent status: Secondary | ICD-10-CM

## 2021-11-07 DIAGNOSIS — Z8249 Family history of ischemic heart disease and other diseases of the circulatory system: Secondary | ICD-10-CM

## 2021-11-07 DIAGNOSIS — I2510 Atherosclerotic heart disease of native coronary artery without angina pectoris: Secondary | ICD-10-CM | POA: Diagnosis present

## 2021-11-07 DIAGNOSIS — H9190 Unspecified hearing loss, unspecified ear: Secondary | ICD-10-CM | POA: Diagnosis present

## 2021-11-07 DIAGNOSIS — H548 Legal blindness, as defined in USA: Secondary | ICD-10-CM | POA: Diagnosis present

## 2021-11-07 DIAGNOSIS — Z833 Family history of diabetes mellitus: Secondary | ICD-10-CM

## 2021-11-07 DIAGNOSIS — R4189 Other symptoms and signs involving cognitive functions and awareness: Secondary | ICD-10-CM | POA: Diagnosis present

## 2021-11-07 DIAGNOSIS — E1169 Type 2 diabetes mellitus with other specified complication: Secondary | ICD-10-CM | POA: Diagnosis present

## 2021-11-08 ENCOUNTER — Emergency Department (HOSPITAL_BASED_OUTPATIENT_CLINIC_OR_DEPARTMENT_OTHER): Payer: Medicare Other

## 2021-11-08 ENCOUNTER — Inpatient Hospital Stay (HOSPITAL_BASED_OUTPATIENT_CLINIC_OR_DEPARTMENT_OTHER)
Admission: EM | Admit: 2021-11-08 | Discharge: 2021-11-15 | DRG: 291 | Disposition: A | Discharge status: Home Health | Payer: Medicare Other | Attending: Internal Medicine | Admitting: Internal Medicine

## 2021-11-08 ENCOUNTER — Encounter (HOSPITAL_BASED_OUTPATIENT_CLINIC_OR_DEPARTMENT_OTHER): Payer: Self-pay

## 2021-11-08 DIAGNOSIS — I10 Essential (primary) hypertension: Secondary | ICD-10-CM | POA: Diagnosis present

## 2021-11-08 DIAGNOSIS — I5023 Acute on chronic systolic (congestive) heart failure: Secondary | ICD-10-CM | POA: Diagnosis present

## 2021-11-08 DIAGNOSIS — I130 Hypertensive heart and chronic kidney disease with heart failure and stage 1 through stage 4 chronic kidney disease, or unspecified chronic kidney disease: Secondary | ICD-10-CM | POA: Diagnosis present

## 2021-11-08 DIAGNOSIS — D509 Iron deficiency anemia, unspecified: Secondary | ICD-10-CM | POA: Diagnosis present

## 2021-11-08 DIAGNOSIS — Z953 Presence of xenogenic heart valve: Secondary | ICD-10-CM | POA: Diagnosis not present

## 2021-11-08 DIAGNOSIS — F419 Anxiety disorder, unspecified: Secondary | ICD-10-CM | POA: Diagnosis present

## 2021-11-08 DIAGNOSIS — I4819 Other persistent atrial fibrillation: Secondary | ICD-10-CM | POA: Diagnosis present

## 2021-11-08 DIAGNOSIS — E785 Hyperlipidemia, unspecified: Secondary | ICD-10-CM | POA: Diagnosis not present

## 2021-11-08 DIAGNOSIS — Z66 Do not resuscitate: Secondary | ICD-10-CM | POA: Diagnosis present

## 2021-11-08 DIAGNOSIS — I452 Bifascicular block: Secondary | ICD-10-CM | POA: Diagnosis present

## 2021-11-08 DIAGNOSIS — Z23 Encounter for immunization: Secondary | ICD-10-CM | POA: Diagnosis present

## 2021-11-08 DIAGNOSIS — R52 Pain, unspecified: Secondary | ICD-10-CM | POA: Diagnosis not present

## 2021-11-08 DIAGNOSIS — N1832 Chronic kidney disease, stage 3b: Secondary | ICD-10-CM | POA: Diagnosis present

## 2021-11-08 DIAGNOSIS — E7800 Pure hypercholesterolemia, unspecified: Secondary | ICD-10-CM | POA: Diagnosis present

## 2021-11-08 DIAGNOSIS — N183 Chronic kidney disease, stage 3 unspecified: Secondary | ICD-10-CM | POA: Diagnosis not present

## 2021-11-08 DIAGNOSIS — J189 Pneumonia, unspecified organism: Secondary | ICD-10-CM | POA: Diagnosis present

## 2021-11-08 DIAGNOSIS — H548 Legal blindness, as defined in USA: Secondary | ICD-10-CM | POA: Diagnosis present

## 2021-11-08 DIAGNOSIS — G4734 Idiopathic sleep related nonobstructive alveolar hypoventilation: Secondary | ICD-10-CM | POA: Diagnosis present

## 2021-11-08 DIAGNOSIS — N390 Urinary tract infection, site not specified: Secondary | ICD-10-CM | POA: Diagnosis present

## 2021-11-08 DIAGNOSIS — R4189 Other symptoms and signs involving cognitive functions and awareness: Secondary | ICD-10-CM | POA: Diagnosis present

## 2021-11-08 DIAGNOSIS — I429 Cardiomyopathy, unspecified: Secondary | ICD-10-CM | POA: Diagnosis present

## 2021-11-08 DIAGNOSIS — R451 Restlessness and agitation: Secondary | ICD-10-CM | POA: Diagnosis not present

## 2021-11-08 DIAGNOSIS — E876 Hypokalemia: Secondary | ICD-10-CM | POA: Diagnosis present

## 2021-11-08 DIAGNOSIS — Z515 Encounter for palliative care: Secondary | ICD-10-CM | POA: Diagnosis not present

## 2021-11-08 DIAGNOSIS — E440 Moderate protein-calorie malnutrition: Secondary | ICD-10-CM | POA: Diagnosis present

## 2021-11-08 DIAGNOSIS — J440 Chronic obstructive pulmonary disease with acute lower respiratory infection: Secondary | ICD-10-CM | POA: Diagnosis present

## 2021-11-08 DIAGNOSIS — Z7189 Other specified counseling: Secondary | ICD-10-CM | POA: Diagnosis not present

## 2021-11-08 DIAGNOSIS — N1831 Chronic kidney disease, stage 3a: Secondary | ICD-10-CM | POA: Diagnosis present

## 2021-11-08 DIAGNOSIS — E119 Type 2 diabetes mellitus without complications: Secondary | ICD-10-CM | POA: Diagnosis not present

## 2021-11-08 DIAGNOSIS — E1169 Type 2 diabetes mellitus with other specified complication: Secondary | ICD-10-CM | POA: Diagnosis present

## 2021-11-08 DIAGNOSIS — E1122 Type 2 diabetes mellitus with diabetic chronic kidney disease: Secondary | ICD-10-CM | POA: Diagnosis present

## 2021-11-08 DIAGNOSIS — J9601 Acute respiratory failure with hypoxia: Secondary | ICD-10-CM | POA: Diagnosis not present

## 2021-11-08 DIAGNOSIS — I4821 Permanent atrial fibrillation: Secondary | ICD-10-CM | POA: Diagnosis present

## 2021-11-08 DIAGNOSIS — F05 Delirium due to known physiological condition: Secondary | ICD-10-CM | POA: Diagnosis present

## 2021-11-08 DIAGNOSIS — R9431 Abnormal electrocardiogram [ECG] [EKG]: Secondary | ICD-10-CM | POA: Diagnosis present

## 2021-11-08 DIAGNOSIS — I5043 Acute on chronic combined systolic (congestive) and diastolic (congestive) heart failure: Principal | ICD-10-CM

## 2021-11-08 DIAGNOSIS — N179 Acute kidney failure, unspecified: Secondary | ICD-10-CM | POA: Diagnosis present

## 2021-11-08 DIAGNOSIS — I5021 Acute systolic (congestive) heart failure: Secondary | ICD-10-CM | POA: Diagnosis not present

## 2021-11-08 DIAGNOSIS — R54 Age-related physical debility: Secondary | ICD-10-CM | POA: Diagnosis present

## 2021-11-08 LAB — COMPREHENSIVE METABOLIC PANEL
ALT: 12 U/L (ref 0–44)
AST: 21 U/L (ref 15–41)
Albumin: 3.9 g/dL (ref 3.5–5.0)
Alkaline Phosphatase: 61 U/L (ref 38–126)
Anion gap: 14 (ref 5–15)
BUN: 38 mg/dL — ABNORMAL HIGH (ref 8–23)
CO2: 22 mmol/L (ref 22–32)
Calcium: 9.3 mg/dL (ref 8.9–10.3)
Chloride: 103 mmol/L (ref 98–111)
Creatinine, Ser: 1.17 mg/dL — ABNORMAL HIGH (ref 0.44–1.00)
GFR, Estimated: 44 mL/min — ABNORMAL LOW (ref >60)
Glucose, Bld: 182 mg/dL — ABNORMAL HIGH (ref 70–99)
Potassium: 4.1 mmol/L (ref 3.5–5.1)
Sodium: 139 mmol/L (ref 135–145)
Total Bilirubin: 0.8 mg/dL (ref 0.3–1.2)
Total Protein: 6.9 g/dL (ref 6.5–8.1)

## 2021-11-08 LAB — MAGNESIUM: Magnesium: 2.2 mg/dL (ref 1.7–2.4)

## 2021-11-08 LAB — GLUCOSE, CAPILLARY: Glucose-Capillary: 120 mg/dL — ABNORMAL HIGH (ref 70–99)

## 2021-11-08 LAB — CBC
HCT: 35 % — ABNORMAL LOW (ref 36.0–46.0)
Hemoglobin: 10.9 g/dL — ABNORMAL LOW (ref 12.0–15.0)
MCH: 29 pg (ref 26.0–34.0)
MCHC: 31.1 g/dL (ref 30.0–36.0)
MCV: 93.1 fL (ref 80.0–100.0)
Platelets: 224 10*3/uL (ref 150–400)
RBC: 3.76 MIL/uL — ABNORMAL LOW (ref 3.87–5.11)
RDW: 15.7 % — ABNORMAL HIGH (ref 11.5–15.5)
WBC: 9.9 10*3/uL (ref 4.0–10.5)
nRBC: 0 % (ref 0.0–0.2)

## 2021-11-08 LAB — TROPONIN I (HIGH SENSITIVITY)
Troponin I (High Sensitivity): 9 ng/L (ref <18)
Troponin I (High Sensitivity): 12 ng/L (ref <18)

## 2021-11-08 LAB — BRAIN NATRIURETIC PEPTIDE: B Natriuretic Peptide: 1638.3 pg/mL — ABNORMAL HIGH (ref 0.0–100.0)

## 2021-11-08 MED ORDER — DOCUSATE SODIUM 100 MG PO CAPS
100.0000 mg | ORAL_CAPSULE | Freq: Two times a day (BID) | ORAL | Status: DC
  Administered 2021-11-08 – 2021-11-15 (×14): 100 mg via ORAL
  Filled 2021-11-08 (×15): qty 1

## 2021-11-08 MED ORDER — APIXABAN 5 MG PO TABS
5.0000 mg | ORAL_TABLET | Freq: Two times a day (BID) | ORAL | Status: DC
  Administered 2021-11-08 – 2021-11-15 (×15): 5 mg via ORAL
  Filled 2021-11-08: qty 1
  Filled 2021-11-08: qty 2
  Filled 2021-11-08 (×13): qty 1

## 2021-11-08 MED ORDER — MELATONIN 3 MG PO TABS
3.0000 mg | ORAL_TABLET | Freq: Once | ORAL | Status: AC
  Administered 2021-11-08: 3 mg via ORAL
  Filled 2021-11-08: qty 1

## 2021-11-08 MED ORDER — ACETAMINOPHEN 325 MG PO TABS
650.0000 mg | ORAL_TABLET | Freq: Four times a day (QID) | ORAL | Status: DC | PRN
  Administered 2021-11-10: 650 mg via ORAL
  Filled 2021-11-08: qty 2

## 2021-11-08 MED ORDER — ALBUTEROL SULFATE (2.5 MG/3ML) 0.083% IN NEBU
3.0000 mL | INHALATION_SOLUTION | Freq: Four times a day (QID) | RESPIRATORY_TRACT | Status: DC | PRN
  Administered 2021-11-10 – 2021-11-13 (×6): 3 mL via RESPIRATORY_TRACT
  Filled 2021-11-08 (×7): qty 3

## 2021-11-08 MED ORDER — SODIUM CHLORIDE 0.9% FLUSH
3.0000 mL | Freq: Two times a day (BID) | INTRAVENOUS | Status: DC
  Administered 2021-11-08 – 2021-11-15 (×14): 3 mL via INTRAVENOUS

## 2021-11-08 MED ORDER — TRAMADOL HCL 50 MG PO TABS
50.0000 mg | ORAL_TABLET | Freq: Three times a day (TID) | ORAL | Status: DC
  Administered 2021-11-08 – 2021-11-15 (×19): 50 mg via ORAL
  Filled 2021-11-08 (×19): qty 1

## 2021-11-08 MED ORDER — ASPIRIN 81 MG PO CHEW
81.0000 mg | CHEWABLE_TABLET | Freq: Every day | ORAL | Status: DC
Start: 2021-11-09 — End: 2021-11-09
  Administered 2021-11-09: 81 mg via ORAL
  Filled 2021-11-08: qty 1

## 2021-11-08 MED ORDER — ACETAMINOPHEN 500 MG PO TABS
1000.0000 mg | ORAL_TABLET | Freq: Once | ORAL | Status: AC
  Administered 2021-11-08: 1000 mg via ORAL
  Filled 2021-11-08: qty 2

## 2021-11-08 MED ORDER — METOPROLOL SUCCINATE ER 100 MG PO TB24
100.0000 mg | ORAL_TABLET | Freq: Every day | ORAL | Status: DC
  Administered 2021-11-08 – 2021-11-09 (×2): 100 mg via ORAL
  Filled 2021-11-08: qty 1
  Filled 2021-11-08: qty 4

## 2021-11-08 MED ORDER — POLYETHYLENE GLYCOL 3350 17 G PO PACK: 17.0000 g | PACK | Freq: Every day | ORAL | Status: DC | PRN

## 2021-11-08 MED ORDER — FUROSEMIDE 10 MG/ML IJ SOLN
40.0000 mg | Freq: Two times a day (BID) | INTRAMUSCULAR | Status: DC
  Administered 2021-11-08 – 2021-11-09 (×2): 40 mg via INTRAVENOUS
  Filled 2021-11-08 (×2): qty 4

## 2021-11-08 MED ORDER — POTASSIUM CHLORIDE 20 MEQ PO PACK
20.0000 meq | PACK | Freq: Every day | ORAL | Status: DC
  Administered 2021-11-08 – 2021-11-11 (×4): 20 meq via ORAL
  Filled 2021-11-08 (×4): qty 1

## 2021-11-08 MED ORDER — ACETAMINOPHEN 650 MG RE SUPP: 650.0000 mg | Freq: Four times a day (QID) | RECTAL | Status: DC | PRN

## 2021-11-08 MED ORDER — ATORVASTATIN CALCIUM 40 MG PO TABS
40.0000 mg | ORAL_TABLET | Freq: Every day | ORAL | Status: DC
  Administered 2021-11-08 – 2021-11-15 (×8): 40 mg via ORAL
  Filled 2021-11-08 (×8): qty 1

## 2021-11-08 MED ORDER — TERAZOSIN HCL 5 MG PO CAPS
10.0000 mg | ORAL_CAPSULE | Freq: Every day | ORAL | Status: DC
  Administered 2021-11-08 – 2021-11-14 (×7): 10 mg via ORAL
  Filled 2021-11-08 (×9): qty 2

## 2021-11-08 MED ORDER — PROCHLORPERAZINE EDISYLATE 10 MG/2ML IJ SOLN
10.0000 mg | Freq: Four times a day (QID) | INTRAMUSCULAR | Status: DC | PRN
  Administered 2021-11-08 – 2021-11-09 (×3): 10 mg via INTRAVENOUS
  Filled 2021-11-08 (×5): qty 2

## 2021-11-08 MED ORDER — BISACODYL 5 MG PO TBEC: 5.0000 mg | DELAYED_RELEASE_TABLET | Freq: Every day | ORAL | Status: DC | PRN

## 2021-11-08 MED ORDER — HYDRALAZINE HCL 20 MG/ML IJ SOLN: 5.0000 mg | INTRAMUSCULAR | Status: DC | PRN

## 2021-11-08 MED ORDER — LOSARTAN POTASSIUM 25 MG PO TABS
25.0000 mg | ORAL_TABLET | Freq: Every day | ORAL | Status: DC
  Administered 2021-11-08: 25 mg via ORAL
  Filled 2021-11-08: qty 1

## 2021-11-08 MED ORDER — FUROSEMIDE 10 MG/ML IJ SOLN
60.0000 mg | Freq: Once | INTRAMUSCULAR | Status: AC
  Administered 2021-11-08: 60 mg via INTRAVENOUS
  Filled 2021-11-08: qty 6

## 2021-11-08 MED ORDER — TRAZODONE HCL 50 MG PO TABS: 25.0000 mg | ORAL_TABLET | Freq: Every evening | ORAL | Status: DC | PRN

## 2021-11-08 NOTE — Assessment & Plan Note (Addendum)
Atrial fibrillation with rapid ventricular response.  ? ?Rate has improved with amiodarone drip, continue metoprolol succinate. ?Anticoagulation with apixaban.  ?Continue telemetry monitoring. ?Encourage out of bed to chair tid with meals, follow with PT and OT recommendations.  ?

## 2021-11-08 NOTE — Assessment & Plan Note (Deleted)
-  I have discussed code status with the patient and her son and  they are in agreement that the patient would not desire resuscitation and would prefer to die a natural death should that situation arise. ?-She will need a gold out of facility DNR form at the time of discharge ?

## 2021-11-08 NOTE — ED Notes (Signed)
Handoff report given to Gregary Signs RN on 3E at [facility] ?

## 2021-11-08 NOTE — Progress Notes (Signed)
Patient became nauseous, provider notified for orders. ?

## 2021-11-08 NOTE — Assessment & Plan Note (Deleted)
-  Patient with known h/o chronic combined CHF presenting with worsening SOB and hypoxia to 88% ?-CXR consistent with mild pulmonary edema ?-Mildly elevated BNP compared to prior ?-With elevated BNP and abnl CXR, acute decompensated CHF seems probable as diagnosis ?-Will admit, as per the Emergency HF Mortality Risk Grade.  The patient has:  severe pulmonary edema requiring new O2 therapy; AKI on CKD with >25% decrease in GFR. ?-CHF order set utilized ?-Will request CHF team/cardiology consult (inbox message sent to [facility] for consult tomorrow) ?-Was given Lasix 40 mg x 1 in ER and will repeat with 40 mg IV BID ?-Continue [place] O2 for now ? ?HTN ?-Continue Toprol XL, terazosin ?-Hold Losartan due to currently diminished renal function ?-Will also add prn hydralazine ? ?HLD ?-Continue Lipitor ?

## 2021-11-08 NOTE — Assessment & Plan Note (Signed)
-  Will attempt to avoid/minimize QT-prolonging medications such as PPI, nausea meds, SSRIs ?-Repeat EKG in AM ?

## 2021-11-08 NOTE — ED Notes (Signed)
Handoff report given to [facility] 

## 2021-11-08 NOTE — Assessment & Plan Note (Addendum)
No agitation, continue neuro checks per unit protocol. ?No clinical encephalopathy.  ?Discontinue morphine.  ?

## 2021-11-08 NOTE — ED Provider Notes (Signed)
?[facility] EMER[facility] DEPT ?Provider Note ? ?CSN: [id number] ?Arrival date & time: 11/07/21 2351 ? ?Chief Complaint(s) ?Shortness of Breath ? ?HPI ?Tara Fields is a 88 y.o. female with extensive past medical history listed below including hypertension, hyperlipidemia, diabetes, COPD, aortic stenosis status post TAVR, CHF with a last EF of 25 to 30% in May 2022, A-fib on anticoagulation who presents to the emergency department with several days of productive coughing, shortness of breath, dyspnea on exertion.  She is endorsing mild chest discomfort, and nausea without emesis.  She denies any fevers.   ? ?The history is provided by the patient and a relative.  ? ?Past Medical History ?Past Medical History:  ?Diagnosis Date  ? Aortic stenosis   ? s/p TAVR 26 mm Edwards Sapien 3 THV July 2020  ? CAD (coronary artery disease)   ? COPD (chronic obstructive pulmonary disease) ([place])   ? Diabetes mellitus ([place])   ? Diverticulitis   ? GERD (gastroesophageal reflux disease)   ? HTN (hypertension)   ? Hyperlipidemia   ? Legally blind in left eye, as defined in [place]   ? Tobacco abuse   ? ?Patient Active Problem List  ? Diagnosis Date Noted  ? Leg cramps 09/03/2021  ? Atrial fibrillation with rapid ventricular response ([place]) 09/02/2021  ? Elevated troponin 09/02/2021  ? Lab test positive for detection of COVID-19 virus   ? Acute on chronic combined systolic and diastolic CHF (congestive heart failure) ([place])   ? AKI (acute kidney injury) ([place])   ? Hyperlipidemia associated with type 2 diabetes mellitus ([place])   ? Overweight (BMI 25.0-29.9)   ? COVID-19 virus infection 06/07/2021  ? Generalized weakness 06/07/2021  ? GERD (gastroesophageal reflux disease) 06/07/2021  ? CHF (congestive heart failure) ([place]) 05/05/2021  ? GI bleed 04/13/2021  ? Anemia 04/13/2021  ? Symptomatic anemia 04/13/2021  ? Prolonged QT interval 04/13/2021  ? CKD (chronic kidney disease), stage III ([place]) 04/13/2021  ? Acute on chronic systolic  CHF (congestive heart failure) ([place]) 01/29/2021  ? Cognitive deficits   ? Pressure injury of skin 12/19/2020  ? Acute exacerbation of CHF (congestive heart failure) ([place]) 12/18/2020  ? COPD (chronic obstructive pulmonary disease) ([place]) 09/02/2020  ? Dyslipidemia 09/02/2020  ? Pulmonary nodule 1 cm or greater in diameter 01/03/2020  ? S/P TAVR (transcatheter aortic valve replacement) 05/03/2019  ? Primary osteoarthritis involving multiple joints 05/03/2019  ? Persistent atrial fibrillation ([place]) 05/03/2019  ? Bilateral carotid artery stenosis 12/01/2017  ? 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/13/2014  ? Aortic valve disorder 05/03/2014  ? Chronic obstructive bronchitis without exacerbation ([place]) 05/03/2014  ? Derangement of left knee 05/03/2014  ? Esophageal reflux 05/03/2014  ? HTN (hypertension) 05/03/2014  ? Pure hypercholesterolemia 05/03/2014  ? Vitamin D deficiency 05/03/2014  ? ?Home Medication(s) ?Prior to Admission medications   ?Medication Sig Start Date End Date Taking? Authorizing Provider  ?acetaminophen (TYLENOL) 500 MG tablet Take 500 mg by mouth 3 (three) times daily as needed for headache (pain). Take with tramadol    [provider]  ?albuterol (VENTOLIN HFA) 108 (90 Base) MCG/ACT inhaler Inhale 2 puffs into the lungs every 6 (six) hours as needed for wheezing or shortness of breath. 06/19/21   Nafziger, Tommi Rumps, NP  ?atorvastatin (LIPITOR) 40 MG tablet TAKE 1 TABLET BY MOUTH DAILY AT 6 PM. 08/28/21   Nafziger, Tommi Rumps, NP  ?cholecalciferol (VITAMIN D3) 25 MCG (1000 UNIT) tablet Take 1,000 Units by mouth 2 (two) times daily.    [provider]  ?Coenzyme Q10 (CO Q-10) 300 MG CAPS Take 300 mg by mouth in the morning.    [provider]  ?CVS ASPIRIN ADULT LOW DOSE 81 MG chewable tablet CHEW 1 TABLET (81 MG TOTAL) BY MOUTH DAILY. 03/10/21   Nafziger, Tommi Rumps, NP  ?dexamethasone (DECADRON) 6 MG tablet  07/08/21   [provider]  ?ELIQUIS 5 MG TABS tablet TAKE 1 TABLET BY MOUTH  TWICE A DAY 10/27/21   Nafziger, Tommi Rumps, NP  ?fenofibrate 160 MG tablet  07/08/21   [provider]  ?Ferrous Gluconate 324 (37.5 Fe) MG TABS Take 1 tablet (324 mg total) by mouth daily with breakfast. 09/09/21   [name], NP  ?HM LIDOCAINE PATCH EX Apply 1 patch topically daily as needed (for back pain).    [provider]  ?Lidocaine-Menthol, Spray, 4-1 % LIQD Apply 1 spray topically daily as needed (back/knee pain/restless legs).    [provider]  ?losartan (COZAAR) 25 MG tablet Take 1 tablet (25 mg total) by mouth daily. 05/29/21   [name], NP  ?Magnesium 200 MG TABS Take 200 mg by mouth every morning.    [provider]  ?metoprolol succinate (TOPROL-XL) 100 MG 24 hr tablet Take 1 tablet (100 mg total) by mouth daily. Take with or immediately following a meal. 08/18/21 09/17/21  [name], NP  ?Multiple Vitamin (MULTIVITAMIN WITH MINERALS) TABS tablet Take 1 tablet by mouth 2 (two) times daily. Centrum Minis    [provider]  ?pantoprazole (PROTONIX) 40 MG tablet  07/08/21   [provider]  ?potassium chloride 20 MEQ/15ML (10%) SOLN Take 15 mLs (20 mEq total) by mouth daily. 05/19/21 09/07/21  [name], PA-C  ?sodium chloride (OCEAN) 0.65 % SOLN nasal spray Place 1 spray into both nostrils daily as needed for congestion.    [provider]  ?Spacer/Aero-Holding Chambers DEVI Use with inhaler 06/19/21   [name], NP  ?terazosin (HYTRIN) 10 MG capsule TAKE 1 CAPSULE BY MOUTH EVERY DAY AT 12 NOON ?Patient taking differently: Take 10 mg by mouth at bedtime. 07/14/21   Nafziger, Tommi Rumps, NP  ?torsemide (DEMADEX) 20 MG tablet Take 2 tablets (40 mg total) by mouth daily. 06/23/21   [name], PA-C  ?traMADol (ULTRAM) 50 MG tablet TAKE 1 TABLET (50 MG TOTAL) BY MOUTH IN THE MORNING, AT NOON, AND AT BEDTIME. ?Patient taking differently: Take 50 mg by mouth 3 (three) times daily. 07/30/21   Nafziger, Tommi Rumps, NP  ?vitamin B-12  (CYANOCOBALAMIN) 1000 MCG tablet Take 1 tablet (1,000 mcg total) by mouth daily. 04/16/21   Oswald Hillock, MD  ?                                                                                                                                  ?Allergies ?Amlodipine besy-benazepril hcl, Hctz [hydrochlorothiazide], Other, Cyclobenzaprine, Diclofenac sodium, Hydralazine, Methylprednisolone, and Oxycodone ? ?Review of Systems ?  Review of Systems ?As noted in HPI ? ?Physical Exam ?Vital Signs  ?I have reviewed the triage vital signs ?BP 130/73   Pulse 88   Temp 98.3 ?F (36.8 ?C)   Resp 15   Ht 5' (1.524 m)   Wt 65.3 kg   SpO2 93%   BMI 28.12 kg/m?  ? ?Physical Exam ?Vitals reviewed.  ?Constitutional:   ?   General: She is not in acute distress. ?   Appearance: She is well-developed. She is not diaphoretic.  ?HENT:  ?   Head: Normocephalic and atraumatic.  ?   Nose: Nose normal.  ?Eyes:  ?   General: No scleral icterus.    ?   Right eye: No discharge.     ?   Left eye: No discharge.  ?   Conjunctiva/sclera: Conjunctivae normal.  ?   Pupils: Pupils are equal, round, and reactive to light.  ?Cardiovascular:  ?   Rate and Rhythm: Normal rate. Rhythm irregularly irregular.  ?   Heart sounds: No murmur heard. ?  No friction rub. No gallop.  ?Pulmonary:  ?   Effort: Pulmonary effort is normal. No respiratory distress.  ?   Breath sounds: Normal breath sounds. No stridor. No wheezing, rhonchi or rales.  ?Abdominal:  ?   General: There is no distension.  ?   Palpations: Abdomen is soft.  ?   Tenderness: There is no abdominal tenderness.  ?Musculoskeletal:     ?   General: No tenderness.  ?   Cervical back: Normal range of motion and neck supple.  ?   Right lower leg: 1+ Pitting Edema present.  ?   Left lower leg: 1+ Pitting Edema present.  ?Skin: ?   General: Skin is warm and dry.  ?   Findings: No erythema or rash.  ?Neurological:  ?   Mental Status: She is alert and oriented to person, place, and time.  ? ? ?ED Results and  Treatments ?Labs ?(all labs ordered are listed, but only abnormal results are displayed) ?Labs Reviewed  ?CBC - Abnormal; Notable for the following components:  ?    Result Value  ? RBC 3.76 (*)   ? Hemoglobin 10.9

## 2021-11-08 NOTE — Assessment & Plan Note (Addendum)
Fasting glucose is 162, continue glucose cover and monitoring with insulin sliding scale.  ?Continue with statin therapy.  ?

## 2021-11-08 NOTE — H&P (Signed)
?History and Physical  ? ? ?Patient: Tara Fields OJJ:[id number] DOB: December 05, 1927 ?DOA: 11/08/2021 ?DOS: the patient was seen and examined on 11/08/2021 ?PCP: [name], NP  ?[phone] ? ? ?Chief Complaint: SOB ? ?HPI: Tara Fields is a 88 y.o. female with medical history significant of CAD; s/p TAVR; COPD; DM; HTN; HLD; afib on Eliquis; and chronic systolic CHF presenting with SOB.  She called her son late last night due to difficulty breathing.  She has had a "cold" and she was coughing productive of multicolor phlegm.  She called last night about 11pm with SOB and he took her to [facility].  She was given Lasix without improvement in coughing/congestion but she seems to have a bit more energy.  She has had hypoxia during sleep.  She has had epigastric pain.  Her son reports that she takes her potassium in 16 ounces of water first thing every morning and then she continues to drink more fluids throughout the day. ? ? ? ?ER Course:  Carryover, per Dr. Nevada Crane: ? ?Shortness of breath and nonproductive cough for the past few days.  Patient has been noncompliant with her fluid restriction, she loves to drink water. Volume overloaded on exam with bilateral lower extremity edema.  Chest x-ray showing mild pulmonary edema and cardiomegaly.  BNP elevated greater than 1600, received a dose of IV Lasix 60 mg x 1.   ? ? ? ? ?Review of Systems: As mentioned in the history of present illness. All other systems reviewed and are negative. ?Past Medical History:  ?Diagnosis Date  ? Aortic stenosis   ? s/p TAVR 26 mm Edwards Sapien 3 THV July 2020  ? CAD (coronary artery disease)   ? COPD (chronic obstructive pulmonary disease) ([place])   ? Diabetes mellitus ([place])   ? Diverticulitis   ? GERD (gastroesophageal reflux disease)   ? HTN (hypertension)   ? Hyperlipidemia   ? Legally blind in left eye, as defined in [place]   ? Tobacco abuse   ? ?Past Surgical  History:  ?Procedure Laterality Date  ? ABDOMINAL HYSTERECTOMY    ? APPENDECTOMY    ? BACK SURGERY    ? CATARACT EXTRACTION, BILATERAL    ? CHOLECYSTECTOMY    ? COLONOSCOPY WITH PROPOFOL N/A 04/16/2021  ? Procedure: COLONOSCOPY WITH PROPOFOL;  Surgeon: Milus Banister, MD;  Location: [facility];  Service: Gastroenterology;  Laterality: N/A;  ? ESOPHAGOGASTRODUODENOSCOPY (EGD) WITH PROPOFOL N/A 04/15/2021  ? Procedure: ESOPHAGOGASTRODUODENOSCOPY (EGD) WITH PROPOFOL;  Surgeon: Doran Stabler, MD;  Location: [facility];  Service: Gastroenterology;  Laterality: N/A;  ? ?Social History:  reports that she quit smoking about 22 months ago. Her smoking use included cigarettes. She has a 70.00 pack-year smoking history. She has never used smokeless tobacco. She reports that she does not drink alcohol and does not use drugs. ? ?Allergies  ?Allergen Reactions  ? Amlodipine Besy-Benazepril Hcl Cough  ? Hctz [Hydrochlorothiazide] Other (See Comments)  ?  Per family, every diuretic the patient has been on flushes out her out and ultimately results in CRAMPING/JAUNDICE (added potassium might help)  ? Other Other (See Comments)  ?  History of diverticulitis- CANNOT TOLERATE NUTS, CORN, AND ANY FOODS NOT EASILY DIGESTED- the patient AVOIDS these  ? Cyclobenzaprine Other (See Comments)  ?  Confusion- does not tolerate well   ? Diclofenac Sodium Diarrhea  ? Hydralazine Other (See Comments)  ?  Per family, every diuretic the  patient has been on flushes out her out and ultimately results in CRAMPING/JAUNDICE (added potassium might help)  ? Methylprednisolone Other (See Comments)  ?  Medrol/CORTICOSTEROIDS = A LOT OF ENERGY  ? Oxycodone Other (See Comments)  ?  Caused patient to "act crazy"  ? ? ?Family History  ?Problem Relation Age of Onset  ? Diabetes Mother   ? Stroke Brother   ? Cerebral palsy Brother   ? Coronary artery disease Neg Hx   ? ? ?Prior to Admission medications   ?Medication Sig Start Date End Date Taking?  Authorizing Provider  ?acetaminophen (TYLENOL) 500 MG tablet Take 500 mg by mouth 3 (three) times daily as needed for headache (pain). Take with tramadol    [provider]  ?albuterol (VENTOLIN HFA) 108 (90 Base) MCG/ACT inhaler Inhale 2 puffs into the lungs every 6 (six) hours as needed for wheezing or shortness of breath. 06/19/21   Nafziger, Tommi Rumps, NP  ?atorvastatin (LIPITOR) 40 MG tablet TAKE 1 TABLET BY MOUTH DAILY AT 6 PM. ?Patient taking differently: Take 40 mg by mouth every evening. 08/28/21   Nafziger, Tommi Rumps, NP  ?cholecalciferol (VITAMIN D3) 25 MCG (1000 UNIT) tablet Take 1,000 Units by mouth 2 (two) times daily.    [provider]  ?Coenzyme Q10 (CO Q-10) 300 MG CAPS Take 300 mg by mouth in the morning.    [provider]  ?[facility] ASPIRIN ADULT LOW DOSE 81 MG chewable tablet CHEW 1 TABLET (81 MG TOTAL) BY MOUTH DAILY. 03/10/21   Nafziger, Tommi Rumps, NP  ?dexamethasone (DECADRON) 6 MG tablet  07/08/21   [provider]  ?ELIQUIS 5 MG TABS tablet TAKE 1 TABLET BY MOUTH TWICE A DAY 10/27/21   Nafziger, Tommi Rumps, NP  ?fenofibrate 160 MG tablet  07/08/21   [provider]  ?Ferrous Gluconate 324 (37.5 Fe) MG TABS Take 1 tablet (324 mg total) by mouth daily with breakfast. 09/09/21   [name], NP  ?HM LIDOCAINE PATCH EX Apply 1 patch topically daily as needed (for back pain).    [provider]  ?Lidocaine-Menthol, Spray, 4-1 % LIQD Apply 1 spray topically daily as needed (back/knee pain/restless legs).    [provider]  ?losartan (COZAAR) 25 MG tablet Take 1 tablet (25 mg total) by mouth daily. 05/29/21   [name], NP  ?Magnesium 200 MG TABS Take 200 mg by mouth every morning.    [provider]  ?metoprolol succinate (TOPROL-XL) 100 MG 24 hr tablet Take 1 tablet (100 mg total) by mouth daily. Take with or immediately following a meal. 08/18/21 09/17/21  [name], NP  ?Multiple Vitamin (MULTIVITAMIN WITH MINERALS) TABS tablet Take 1 tablet  by mouth 2 (two) times daily. Centrum Minis    [provider]  ?pantoprazole (PROTONIX) 40 MG tablet  07/08/21   [provider]  ?potassium chloride 20 MEQ/15ML (10%) SOLN Take 15 mLs (20 mEq total) by mouth daily. 05/19/21 09/07/21  [name], PA-C  ?sodium chloride (OCEAN) 0.65 % SOLN nasal spray Place 1 spray into both nostrils daily as needed for congestion.    [provider]  ?Spacer/Aero-Holding Chambers DEVI Use with inhaler 06/19/21   [name], NP  ?terazosin (HYTRIN) 10 MG capsule TAKE 1 CAPSULE BY MOUTH EVERY DAY AT 12 NOON ?Patient taking differently: Take 10 mg by mouth at bedtime. 07/14/21   Nafziger, Tommi Rumps, NP  ?torsemide (DEMADEX) 20 MG tablet Take 2 tablets (40 mg total) by mouth daily. 06/23/21   Ermalinda Barrios  M, PA-C  ?traMADol (ULTRAM) 50 MG tablet TAKE 1 TABLET (50 MG TOTAL) BY MOUTH IN THE MORNING, AT NOON, AND AT BEDTIME. ?Patient taking differently: Take 50 mg by mouth 3 (three) times daily. 07/30/21   Nafziger, Tommi Rumps, NP  ?vitamin B-12 (CYANOCOBALAMIN) 1000 MCG tablet Take 1 tablet (1,000 mcg total) by mouth daily. 04/16/21   Oswald Hillock, MD  ? ? ?Physical Exam: ?Vitals:  ? 11/08/21 1156 11/08/21 1200 11/08/21 1328 11/08/21 1559  ?BP: (!) 104/54 (!) 104/58 111/61 (!) 143/69  ?Pulse: 85 78 (!) 54 80  ?Resp: (!) 22 (!) 22 (!) 23 20  ?Temp: 97.8 ?F (36.6 ?C)  (!) 97.5 ?F (36.4 ?C) 97.9 ?F (36.6 ?C)  ?TempSrc:   Oral   ?SpO2: 96% 95% 97% 92%  ?Weight:   64.2 kg   ?Height:   5' (1.524 m)   ? ?General:  Appears calm and comfortable and is in NAD, very conversant ?Eyes:  EOMI, normal lids, iris ?ENT:  hard of hearing, grossly normal lips & tongue, mmm; artificial dentition ?Neck:  no LAD, masses or thyromegaly ?Cardiovascular:  RRR, no m/r/g. No LE edema.  ?Respiratory:   CTA bilaterally with no wheezes/rales/rhonchi.  Normal to mildly increased respiratory effort. ?Abdomen:  soft, NT, ND ?Skin:  no rash or induration seen on limited exam ?Musculoskeletal:   grossly normal tone BUE/BLE, good ROM, no bony abnormality ?Psychiatric:  grossly normal mood and affect, speech fluent and appropriate, AOx3, ?mild cognitive impairment ?Neurologic:  CN 2-12 grossly intact, mov

## 2021-11-08 NOTE — Progress Notes (Signed)
Patient was a little confused and anxious during shift change, day shift nurse states that she has been A&O since she arrived on the unit. I Spoke with son Louie Casa) to see if she gets confused at times or at night. He says lately, since the time has changed, she has been getting a little confused. Patients facility did not know anything since she lives at an independent facility. Son requests melatonin for her because she stated she needed some rest. Will notify provider and continue to monitor patient. ?The charge nurse on day shift says she started to get anxious and confused around 4-5p. ?

## 2021-11-08 NOTE — Assessment & Plan Note (Signed)
-  Reported by son, who also has this issue ?-She is currently requiring 1L [place] O2 ?-Will order overnight pulse ox for tomorrow night (hopefully she will be off [place] O2 by then) ?-She may need home O2 ?

## 2021-11-08 NOTE — Assessment & Plan Note (Addendum)
CKD stage 3a, hypokalemia. ? ?Renal function with serum cr at 1,0 with K at 3,6 and serum bicarbonate at 28.  ?Continue close monitoring of renal function and electrolytes. ?Diuresis with torsemide.  ? ? ?

## 2021-11-08 NOTE — ED Triage Notes (Signed)
Presented via triage with c/o cough, sob and nausea that started last night. Pt admits to having some cp. Denies fever  ?

## 2021-11-08 NOTE — Progress Notes (Signed)
Patient was resting; at rest O2 levels dropped to the 80s. Increased from 2L to 3L of O2. ?

## 2021-11-08 NOTE — ED Notes (Signed)
[facility] at bedside 

## 2021-11-09 ENCOUNTER — Inpatient Hospital Stay (HOSPITAL_COMMUNITY): Payer: Medicare Other

## 2021-11-09 ENCOUNTER — Encounter (HOSPITAL_COMMUNITY): Payer: Self-pay | Admitting: Internal Medicine

## 2021-11-09 DIAGNOSIS — I10 Essential (primary) hypertension: Secondary | ICD-10-CM

## 2021-11-09 DIAGNOSIS — N183 Chronic kidney disease, stage 3 unspecified: Secondary | ICD-10-CM | POA: Diagnosis not present

## 2021-11-09 DIAGNOSIS — I5021 Acute systolic (congestive) heart failure: Secondary | ICD-10-CM

## 2021-11-09 DIAGNOSIS — R4189 Other symptoms and signs involving cognitive functions and awareness: Secondary | ICD-10-CM

## 2021-11-09 DIAGNOSIS — I4819 Other persistent atrial fibrillation: Secondary | ICD-10-CM | POA: Diagnosis not present

## 2021-11-09 DIAGNOSIS — I5023 Acute on chronic systolic (congestive) heart failure: Secondary | ICD-10-CM | POA: Diagnosis not present

## 2021-11-09 DIAGNOSIS — E440 Moderate protein-calorie malnutrition: Secondary | ICD-10-CM | POA: Diagnosis present

## 2021-11-09 DIAGNOSIS — E119 Type 2 diabetes mellitus without complications: Secondary | ICD-10-CM

## 2021-11-09 LAB — ECHOCARDIOGRAM COMPLETE
AR max vel: 2.18 cm2
AV Area VTI: 2.06 cm2
AV Area mean vel: 2.33 cm2
AV Mean grad: 6.7 mmHg
AV Peak grad: 12.9 mmHg
Ao pk vel: 1.8 m/s
Height: 60 in
S' Lateral: 5.5 cm
Single Plane A4C EF: 17.7 %
Weight: 2299.84 oz

## 2021-11-09 LAB — URINALYSIS, ROUTINE W REFLEX MICROSCOPIC
Bilirubin Urine: NEGATIVE
Glucose, UA: NEGATIVE mg/dL
Ketones, ur: NEGATIVE mg/dL
Nitrite: NEGATIVE
Protein, ur: NEGATIVE mg/dL
Specific Gravity, Urine: 1.012 (ref 1.005–1.030)
pH: 6 (ref 5.0–8.0)

## 2021-11-09 LAB — CBC
HCT: 33.1 % — ABNORMAL LOW (ref 36.0–46.0)
Hemoglobin: 10.9 g/dL — ABNORMAL LOW (ref 12.0–15.0)
MCH: 30.6 pg (ref 26.0–34.0)
MCHC: 32.9 g/dL (ref 30.0–36.0)
MCV: 93 fL (ref 80.0–100.0)
Platelets: 203 10*3/uL (ref 150–400)
RBC: 3.56 MIL/uL — ABNORMAL LOW (ref 3.87–5.11)
RDW: 15.6 % — ABNORMAL HIGH (ref 11.5–15.5)
WBC: 7.6 10*3/uL (ref 4.0–10.5)
nRBC: 0 % (ref 0.0–0.2)

## 2021-11-09 LAB — BASIC METABOLIC PANEL
Anion gap: 12 (ref 5–15)
BUN: 35 mg/dL — ABNORMAL HIGH (ref 8–23)
CO2: 22 mmol/L (ref 22–32)
Calcium: 8.8 mg/dL — ABNORMAL LOW (ref 8.9–10.3)
Chloride: 104 mmol/L (ref 98–111)
Creatinine, Ser: 1.08 mg/dL — ABNORMAL HIGH (ref 0.44–1.00)
GFR, Estimated: 48 mL/min — ABNORMAL LOW (ref >60)
Glucose, Bld: 166 mg/dL — ABNORMAL HIGH (ref 70–99)
Potassium: 3.5 mmol/L (ref 3.5–5.1)
Sodium: 138 mmol/L (ref 135–145)

## 2021-11-09 MED ORDER — ENSURE ENLIVE PO LIQD
237.0000 mL | Freq: Two times a day (BID) | ORAL | Status: DC
  Administered 2021-11-09 – 2021-11-15 (×11): 237 mL via ORAL

## 2021-11-09 MED ORDER — POTASSIUM CHLORIDE CRYS ER 20 MEQ PO TBCR
20.0000 meq | EXTENDED_RELEASE_TABLET | Freq: Once | ORAL | Status: AC
  Administered 2021-11-09: 20 meq via ORAL
  Filled 2021-11-09: qty 1

## 2021-11-09 MED ORDER — ORAL CARE MOUTH RINSE
15.0000 mL | Freq: Two times a day (BID) | OROMUCOSAL | Status: DC
  Administered 2021-11-09 – 2021-11-15 (×12): 15 mL via OROMUCOSAL

## 2021-11-09 MED ORDER — PERFLUTREN LIPID MICROSPHERE
1.0000 mL | INTRAVENOUS | Status: AC | PRN
  Administered 2021-11-09: 1.5 mL via INTRAVENOUS
  Filled 2021-11-09: qty 10

## 2021-11-09 MED ORDER — SODIUM CHLORIDE 0.9 % IV SOLN
1.0000 g | INTRAVENOUS | Status: DC
  Administered 2021-11-09: 1 g via INTRAVENOUS
  Filled 2021-11-09: qty 10

## 2021-11-09 MED ORDER — TORSEMIDE 20 MG PO TABS
40.0000 mg | ORAL_TABLET | Freq: Every day | ORAL | Status: DC
  Administered 2021-11-10 – 2021-11-15 (×6): 40 mg via ORAL
  Filled 2021-11-09 (×6): qty 2

## 2021-11-09 MED ORDER — MORPHINE SULFATE (PF) 2 MG/ML IV SOLN
0.2500 mg | INTRAVENOUS | Status: DC | PRN
  Administered 2021-11-09 – 2021-11-12 (×10): 0.25 mg via INTRAVENOUS
  Filled 2021-11-09 (×10): qty 1

## 2021-11-09 MED ORDER — AMIODARONE HCL IN DEXTROSE 360-4.14 MG/200ML-% IV SOLN
60.0000 mg/h | INTRAVENOUS | Status: AC
Start: 2021-11-09 — End: 2021-11-09
  Administered 2021-11-09: 60 mg/h via INTRAVENOUS
  Filled 2021-11-09: qty 200

## 2021-11-09 MED ORDER — AMIODARONE HCL IN DEXTROSE 360-4.14 MG/200ML-% IV SOLN
30.0000 mg/h | INTRAVENOUS | Status: DC
Start: 2021-11-09 — End: 2021-11-10
  Administered 2021-11-09 – 2021-11-10 (×2): 30 mg/h via INTRAVENOUS
  Filled 2021-11-09 (×2): qty 200

## 2021-11-09 MED ORDER — SPIRONOLACTONE 12.5 MG HALF TABLET
12.5000 mg | ORAL_TABLET | Freq: Every day | ORAL | Status: DC
  Administered 2021-11-09 – 2021-11-15 (×7): 12.5 mg via ORAL
  Filled 2021-11-09 (×7): qty 1

## 2021-11-09 MED ORDER — ADULT MULTIVITAMIN W/MINERALS CH
1.0000 | ORAL_TABLET | Freq: Every day | ORAL | Status: DC
Start: 2021-11-09 — End: 2021-11-15
  Administered 2021-11-09 – 2021-11-15 (×7): 1 via ORAL
  Filled 2021-11-09 (×7): qty 1

## 2021-11-09 MED ORDER — MORPHINE SULFATE (PF) 2 MG/ML IV SOLN
1.0000 mg | INTRAVENOUS | Status: AC
  Administered 2021-11-10: 1 mg via INTRAVENOUS
  Filled 2021-11-09: qty 1

## 2021-11-09 MED ORDER — TORSEMIDE 20 MG PO TABS
20.0000 mg | ORAL_TABLET | Freq: Every evening | ORAL | Status: DC
  Administered 2021-11-10: 20 mg via ORAL
  Filled 2021-11-09: qty 1

## 2021-11-09 MED ORDER — METOPROLOL SUCCINATE ER 50 MG PO TB24
50.0000 mg | ORAL_TABLET | Freq: Every day | ORAL | Status: DC
  Administered 2021-11-10: 50 mg via ORAL
  Filled 2021-11-09 (×2): qty 1

## 2021-11-09 NOTE — TOC Initial Note (Signed)
Transition of Care (TOC) - Initial/Assessment Note  ? ? ?Patient Details  ?Name: Tara Fields ?MRN: [id number] ?Date of Birth: 08-25-1927 ? ?Transition of Care (TOC) CM/SW Contact:    ?Tara Mayo, RN ?Phone Number: ?11/09/2021, 2:47 PM ? ?Clinical Narrative:                 ?Patient is from [place], she has a legal [place] , Tara Fields [phone].  NCM contacted Tara Raymond, he states yes she is in IDL , he goes by to see her 4x/week and fix her medications for her. She gets her meals onsite 3x/per day, but she chooses to go down for one meal and fix her self something else in her room, uses a walker.  Tara Raymond takes her to her MD apts. She was with [facility] for [place] would like to keep her with [facility].  NCM confirmed with Tara Fields with [facility] for [place], and [facility].  Tara Fields states he does not want her to do physical therapy because she ends up going to the doctor for her bone pain, so would just like Nursing and a aide if possible.  Tara Fields can take referral for [facility], [facility].  Soc will begin 24 to 48 hrs post dc.  Tara Raymond will transport her home at discharge.  ? ?Expected Discharge Plan: [place] ?Barriers to Discharge: Continued Medical Work up ? ? ?Patient Goals and CMS Choice ?Patient states their goals for this hospitalization and ongoing recovery are:: return to IDL ?CMS Medicare.gov Compare Post Acute Care list provided to:: Patient Represenative (must comment) ?Choice offered to / list presented to : Adult Children ? ?Expected Discharge Plan and Services ?Expected Discharge Plan: [place] ?In-house Referral: NA ?Discharge Planning Services: CM Consult ?Post Acute Care Choice: Home Health ?Living arrangements for the past 2 months: [place] ?                ?  ?DME Agency: NA ?  ?  ?  ?HH Arranged: Therapist, sports, Nurse's Aide ?[place] Agency: [place] ([place]) ?Date HH Agency Contacted: 11/09/21 ?Time [place]:  5427 ?Representative spoke with at [place]: Tara Fields ? ?Prior Living Arrangements/Services ?Living arrangements for the past 2 months: [place] ?Lives with:: Self ?Patient language and need for interpreter reviewed:: Yes ?Do you feel safe going back to the place where you live?: Yes      ?Need for Family Participation in Patient Care: Yes (Comment) ?Care giver support system in place?: Yes (comment) ?Current home services:  (walker and shower chair,) ?Criminal Activity/Legal Involvement Pertinent to Current Situation/Hospitalization: No - Comment as needed ? ?Activities of Daily Living ?Home Assistive Devices/Equipment: Gil[place] Rile (specify type) ?ADL Screening (condition at time of admission) ?Patient's cognitive ability adequate to safely complete daily activities?: Yes ?Is the patient deaf or have difficulty hearing?: Yes ?Does the patient have difficulty seeing, even when wearing glasses/contacts?: Yes ?Does the patient have difficulty concentrating, remembering, or making decisions?: No ?Patient able to express need for assistance with ADLs?: Yes ?Does the patient have difficulty dressing or bathing?: No ?Independently performs ADLs?: Yes (appropriate for developmental age) ?Does the patient have difficulty walking or climbing stairs?: No ?Weakness of Legs: None ?Weakness of Arms/Hands: None ? ?Permission Sought/Granted ?  ?  ?   ?   ?   ?   ? ?Emotional Assessment ?Appearance:: Appears stated age ?  ?  ?Orientation: : Oriented to Self, Oriented to  Place, Oriented to  Time ?Alcohol / Substance Use: Not Applicable ?Psych Involvement: Outpatient Provider ? ?Admission diagnosis:  Acute on chronic systolic CHF (congestive heart failure) ([place]) [I50.23] ?Acute on chronic combined systolic and diastolic CHF (congestive heart failure) ([place]) [I50.43] ?Patient Active Problem List  ? Diagnosis Date Noted  ? Malnutrition of moderate degree 11/09/2021  ? Type 2 diabetes mellitus with hyperlipidemia ([place])  11/08/2021  ? DNR (do not resuscitate) 11/08/2021  ? Leg cramps 09/03/2021  ? Atrial fibrillation with rapid ventricular response ([place]) 09/02/2021  ? Elevated troponin 09/02/2021  ? Lab test positive for detection of COVID-19 virus   ? Acute on chronic combined systolic and diastolic CHF (congestive heart failure) ([place])   ? AKI (acute kidney injury) ([place])   ? Hyperlipidemia associated with type 2 diabetes mellitus ([place])   ? Overweight (BMI 25.0-29.9)   ? COVID-19 virus infection 06/07/2021  ? Generalized weakness 06/07/2021  ? GERD (gastroesophageal reflux disease) 06/07/2021  ? CHF (congestive heart failure) ([place]) 05/05/2021  ? GI bleed 04/13/2021  ? Anemia 04/13/2021  ? Symptomatic anemia 04/13/2021  ? CKD (chronic kidney disease), stage III ([place]) 04/13/2021  ? Acute on chronic systolic CHF (congestive heart failure) ([place]) 01/29/2021  ? Cognitive deficits   ? Pressure injury of skin 12/19/2020  ? Acute exacerbation of CHF (congestive heart failure) ([place]) 12/18/2020  ? COPD (chronic obstructive pulmonary disease) ([place]) 09/02/2020  ? Pulmonary nodule 1 cm or greater in diameter 01/03/2020  ? S/P TAVR (transcatheter aortic valve replacement) 05/03/2019  ? Primary osteoarthritis involving multiple joints 05/03/2019  ? Persistent atrial fibrillation ([place]) 05/03/2019  ? Bilateral carotid artery stenosis 12/01/2017  ? 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/13/2014  ? Aortic valve disorder 05/03/2014  ? Chronic obstructive bronchitis without exacerbation ([place]) 05/03/2014  ? Derangement of left knee 05/03/2014  ? Esophageal reflux 05/03/2014  ? HTN (hypertension) 05/03/2014  ? Vitamin D deficiency 05/03/2014  ? ?PCP:  Tara Peng, NP ?Pharmacy:   ?[id number] [place] [place] - [address] ?[place] ?[place] [address] ?Phone: [phone] Fax: [phone] ? ? ? ? ?Social Determinants of Health (SDOH) Interventions ?  ? ?Readmission Risk  Interventions ? ?  11/09/2021  ?  2:38 PM 09/04/2021  ?  2:20 PM 06/08/2021  ? 11:39 AM  ?Readmission Risk Prevention Plan  ?Transportation Screening Complete Complete Complete  ?PCP or Specialist Appt within 3-5 Days  Complete   ?[place] or Home Care Consult  Complete   ?Social Work Consult for [place] Planning/Counseling  Complete   ?Palliative Care Screening  Not Applicable   ?Medication Review Press photographer) Complete Complete Complete  ?PCP or Specialist appointment within 3-5 days of discharge Complete    ?[place] or Home Care Consult Complete  Complete  ?SW Recovery Care/Counseling Consult Complete  Complete  ?Palliative Care Screening Not Applicable  Not Applicable  ?[place] Not Applicable  Not Applicable  ? ? ? ?

## 2021-11-09 NOTE — Progress Notes (Signed)
Initial Nutrition Assessment ? ?DOCUMENTATION CODES:  ? ?Non-severe (moderate) malnutrition in context of chronic illness ? ?INTERVENTION:  ? ?Liberalize diet to regular due to malnutrition. ?Encourage good PO intake ?Multivitamin w/ minerals daily ?Ensure Enlive po BID, each supplement provides 350 kcal and 20 grams of protein. ? ?NUTRITION DIAGNOSIS:  ? ?Moderate Malnutrition related to chronic illness (CHF, COPD) as evidenced by moderate muscle depletion, moderate fat depletion. ? ?GOAL:  ? ?Patient will meet greater than or equal to 90% of their needs ? ?MONITOR:  ? ?PO intake, Supplement acceptance, Labs ? ?REASON FOR ASSESSMENT:  ? ?Consult ?Other (Comment) (Nutritional Goals) ? ?ASSESSMENT:  ? ?88 y.o. female presented to the ED with shortness of breath. PMH includes CHF, COPD, T2DM, HTN, and GERD. Pt admitted with CHF exacerbation.  ? ?Pt resting in bed at time of visit. Pt fell asleep and would not wake back up during RD visit. RD unable to obtain much information.  ? ?Pt reports that her appetite is "fine" and that she has been eating "fine." Pt reports that she lives in a facility and that she does not have to cook her own meals. ?Pt reports that she ambulates with a walker at baseline.  ? ? ?Medications reviewed and include: Colace, Lasix, Potassium Chloride ?Labs reviewed: BUN 35 ? ?NUTRITION - FOCUSED PHYSICAL EXAM: ? ?Flowsheet Row Most Recent Value  ?Orbital Region Moderate depletion  ?Upper Arm Region No depletion  ?Thoracic and Lumbar Region No depletion  ?Buccal Region Moderate depletion  ?Temple Region Moderate depletion  ?Clavicle Bone Region Moderate depletion  ?Clavicle and Acromion Bone Region Moderate depletion  ?Scapular Bone Region Moderate depletion  ?Dorsal Hand Moderate depletion  ?Patellar Region Moderate depletion  ?Anterior Thigh Region Moderate depletion  ?Posterior Calf Region Moderate depletion  ?Edema (RD Assessment) None  ?Hair Reviewed  ?Eyes Unable to assess  ?Mouth Unable to  assess  ?Skin Reviewed  ?Nails Reviewed  ? ?Diet Order:   ?Diet Order   ? ?       ?  Diet Heart Room service appropriate? Yes; Fluid consistency: Thin; Fluid restriction: 1500 mL Fluid  Diet effective now       ?  ? ?  ?  ? ?  ? ? ?EDUCATION NEEDS:  ? ?No education needs have been identified at this time ? ?Skin:  Skin Assessment: Reviewed RN Assessment ? ?Last BM:  3/26 ? ?Height:  ? ?Ht Readings from Last 1 Encounters:  ?11/08/21 5' (1.524 m)  ? ? ?Weight:  ? ?Wt Readings from Last 1 Encounters:  ?11/09/21 65.2 kg  ? ? ?Ideal Body Weight:  45.5 kg ? ?BMI:  Body mass index is 28.07 kg/m?. ? ?Estimated Nutritional Needs:  ? ?Kcal:  2000-2200 ? ?Protein:  100-115 grams ? ?Fluid:  >/= 2 L ? ? ? ?Hermina Barters RD, LDN ?Clinical Dietitian ?See AMiON for contact information.  ? ?

## 2021-11-09 NOTE — Hospital Course (Addendum)
Tara Fields was admitted to the hospital with the working diagnosis of decompensated heart failure. ?Atrial fibrillation with RVR.  ? ?88 yo female with the past medical history of coronary artery disease, AS sp TAVR, COPD, hypertension, T2DM, and atrial fibrillation who presented with dyspnea. Patient had a recent URI, consistent with productive cough, the night prior to hospitalization she had a acute worsening of dyspnea, prompted her to come to the ED. On her initial physical examination her blood pressure was 104/54, HR 85, RR 22, oxygen saturation 97%, lungs with no rales or wheezing (after furosemide ED), heart with S1 and S2 present and rhythmic, abdomen not distended and no lower extremity edema.  ? ?Na 139, K 4.1 Cl 103, bicarbonate 22, glucose 182, bun 38 cr 1,17 ?BNP 1,638  ?High sensitive troponin 9 and 12 ?Wbc 9,9 hgb 10,9, hct 35,0 plt 224  ? ?Chest radiograph with cardiomegaly with bilateral hilar vascular congestion.  ? ?EKG 109 bpm, left axis deviation, right bundle branch block, qtc 538, atrial fibrillation, with q wave lead II, III, AvF, J point elevatin v2 to v4, with no significant T wave changes.  ? ?Patient was placed on furosemide for diuresis. ?Patient with atrial fibrillation with RVR started on amiodarone for rate control.  ?

## 2021-11-09 NOTE — Progress Notes (Addendum)
?Progress Note ? ? ?PatientShanikia Fields GBT:[id number] DOB: Nov 28, 1927 DOA: 11/08/2021     1 ?DOS: the patient was seen and examined on 11/09/2021 ?  ?Brief hospital course: ?[name] was admitted to the hospital with the working diagnosis of decompensated heart failure. ?Atrial fibrillation with RVR.  ? ?88 yo female with the past medical history of coronary artery disease, AS sp TAVR, COPD, hypertension, T2DM, and atrial fibrillation who presented with dyspnea. Patient had a recent URI, consistent with productive cough, the night prior to hospitalization she had a acute worsening of dyspnea, prompted her to come to the ED. On her initial physical examination her blood pressure was 104/54, HR 85, RR 22, oxygen saturation 97%, lungs with no rales or wheezing (after furosemide ED), heart with S1 and S2 present and rhythmic, abdomen not distended and no lower extremity edema.  ? ?Na 139, K 4.1 Cl 103, bicarbonate 22, glucose 182, bun 38 cr 1,17 ?BNP 1,638  ?High sensitive troponin 9 and 12 ?Wbc 9,9 hgb 10,9, hct 35,0 plt 224  ? ?Chest radiograph with cardiomegaly with bilateral hilar vascular congestion.  ? ?EKG 109 bpm, left axis deviation, right bundle branch block, qtc 538, atrial fibrillation, with q wave lead II, III, AvF, J point elevatin v2 to v4, with no significant T wave changes.  ? ?Patient was placed on furosemide for diuresis. ?Patient with atrial fibrillation with RVR started on amiodarone for rate control.  ? ?Assessment and Plan: ?* Acute on chronic systolic CHF (congestive heart failure) ([place]) ?Her last echocardiogram from 2022 had a reduced LV systolic function 25 to 71% with global hypokinesis. Preserved RV systolic function. Severe dilatation of left atrium, no significant valvular disease.  ? ?Urine output 1,600  Ml. ?Clinically with improved volume status.  ? ?She has severe dyspnea this am associated with tachycardia.  ?Plan to continue diuresis with furosemide.  ?Rate control  atrial fibrillation, add amiodarone. ?Considering reduced LV systolic function and uncontrolled atrial fibrillation, will consult heart failure team for further recommendations.  ? ?Persistent atrial fibrillation ([place]) ?Patient with uncontrolled atrial fibrillation with RVR, positive dyspnea. ?Patient has been on metoprolol 100 mg succinate daily.  ? ?Plan to add amiodarone for rate control, continue close telemetry monitoring and anticoagulation with apixaban. ?Consult cardiology for further recommendations.  ? ?CKD (chronic kidney disease), stage III ([place]) ?CKD stage 3a ?Renal function with serum cr at 1,0 with K at 3,5 and serum bicarbonate at 22. ?Plan to add 40 meq Kcl to keep K at 4 range, check Mg.  ?Continue diuresis with furosemide and follow up renal function in am.  ? ?HTN (hypertension) ?Systolic blood pressure 062 to 140 mmHg. ?Continue close monitoring. ?Patient on metoprolol 100 mg succinate daily.  ? ?Type 2 diabetes mellitus with hyperlipidemia ([place]) ?Fasting glucose is 166, continue glucose cover and monitoring with insulin sliding scale. \ ?Continue with statin therapy.  ? ?Cognitive deficits ?No agitation, continue neuro checks per unit protocol. ?No clinical encephalopathy.  ? ? ? ? ?  ? ?Subjective: patient with with dyspnea and palpitations, positive chest pain.  ? ?Physical Exam: ?Vitals:  ? 11/09/21 0455 11/09/21 0542 11/09/21 1010 11/09/21 1017  ?BP: (!) 146/73  140/78   ?Pulse: 85  (!) 105 93  ?Resp: 20  (!) 26 18  ?Temp: 98.4 ?F (36.9 ?C)  98.3 ?F (36.8 ?C)   ?TempSrc: Oral  Oral   ?SpO2: 95%  96% 93%  ?Weight:  65.2 kg    ?Height:      ? ?  Neurology awake, eyes closed but responds to simple questions ?ENT with mild pallor ?Cardiovascular with S1 and S2 present, irregularly irregular, tachycardic, with no rubs or gallops ?Mild JVD ?No lower extremity edema ?Respiratory with scattered rales at bases, no wheezing or rhonchi ?Abdomen soft and non tender  ? ?Data Reviewed: ? ? ? ?Family  Communication: no family a the bedside  ? ?Disposition: ?Status is: Inpatient ?Remains inpatient appropriate because: atrial fibrillation with RVR.  ? Planned Discharge Destination: Home ? ? ? ?Author: ?Tawni Millers, MD ?11/09/2021 10:56 AM ? ?For on call review [email or web address].  ?

## 2021-11-09 NOTE — Consult Note (Signed)
? ?  [facility] CM Inpatient Consult ? ? ?11/09/2021 ? ?Tara Fields ?[birth year] ?[id number] ? ?[place] [facility] [ACO] Patient: Medicare ACO Reach ? ?Primary Care Provider:  [name], NP, Clover Mealy is an embedded provider with a Chronic Care Management team  ? ?Patient has a legal guardian noted. ? ?Patient was screened for Embedded practice service needs for the chronic care management team and program, and is listed for the transition of care follow up and appointments for post heart failure follow up.   ?Came by to round on patient and patient is in medical care with staff, did not see son in the room at this time. ?Plan: Will continue to follow for post hospital needs.  A referral can be sent to the Embedded Care Management if there are needs for post hospital CCM. ? ?Please contact for further questions, ? ?Natividad Brood, RN BSN CCM ?[facility] Liaison ? [phone] business mobile phone ?Toll free office [phone]  ?Fax number: [phone] ?[email or web address] ? ? ? ?

## 2021-11-09 NOTE — Evaluation (Signed)
Occupational Therapy Evaluation Patient Details Name: Tara Fields MRN: [id number] DOB: March 20, 1928 Today's Date: 11/09/2021   History of Present Illness Pt is a 88 y.o. female who presented 11/08/21 with SOB, and had a productive cough. Pt admitted with CHF exacerbation. PMH: chronic systolic CHF EF 20-25%, h/o TAVr, PAfib on eliquis, COPD, CAD, CKD 3a, aortic stenosis s/p TAVR   Clinical Impression   Prior to this admission, patient living at [facility] in an apartment. Patient endorsing she uses a rollator to ambulate, however unable to state how much assist she needed for ADLs. Patient with significant confusion from being up all night, patient unable to follow simple commands, attempting to sit up and exit bed after bed had been risen to assist scooting patient up from the bottom of the bed. Patient close min guard for bed mobility, but unable to redirect using memory care strategies to attempt transfers to ADLs. Patient with intermittent afib (up to 109 sitting EOB and in supine) and O2 stable on room air. OT recommending HHOT at [facility] to ensure safe disposition when returning home. OT will continue to follow and assess if further assistance is needed once confusion subsides.      Recommendations for follow up therapy are one component of a multi-disciplinary discharge planning process, led by the attending physician.  Recommendations may be updated based on patient status, additional functional criteria and insurance authorization.   Follow Up Recommendations  Home health OT    Assistance Recommended at Discharge Frequent or constant Supervision/Assistance  Patient can return home with the following A little help with walking and/or transfers;A little help with bathing/dressing/bathroom;Assistance with cooking/housework;Direct supervision/assist for medications management;Direct supervision/assist for financial management;Assist for transportation;Help with stairs or ramp  for entrance    Functional Status Assessment  Patient has had a recent decline in their functional status and demonstrates the ability to make significant improvements in function in a reasonable and predictable amount of time.  Equipment Recommendations  Other (comment) (Will continue to assess)    Recommendations for Other Services       Precautions / Restrictions Precautions Precautions: Fall Precaution Comments: Watch O2 Restrictions Weight Bearing Restrictions: No      Mobility Bed Mobility Overal bed mobility: Needs Assistance Bed Mobility: Supine to Sit, Sit to Supine     Supine to sit: Min guard Sit to supine: Min guard   General bed mobility comments: Close min gaurd needed with patients impulsivity, nearly hitting bottom bed frame with her head when attempting to take off bathrobe    Transfers                   General transfer comment: Despite multiple attempts patient refusing, could be redirected with each attempt, however would get confused and frustrated despite using memory deficit strategies      Balance Overall balance assessment: Mild deficits observed, not formally tested                                         ADL either performed or assessed with clinical judgement   ADL Overall ADL's : Needs assistance/impaired Eating/Feeding: Set up;Sitting   Grooming: Set up;Sitting   Upper Body Bathing: Minimal assistance;Sitting   Lower Body Bathing: Moderate assistance;Sitting/lateral leans   Upper Body Dressing : Minimal assistance;Sitting   Lower Body Dressing: Moderate assistance;Sitting/lateral leans   Toilet Transfer: Cueing for sequencing;Cueing for safety;Moderate  assistance           Functional mobility during ADLs: Moderate assistance;Rolling walker (2 wheels);+2 for physical assistance;+2 for safety/equipment;Cueing for sequencing;Cueing for safety General ADL Comments: Patient requiring increased assist in  session due to confusion and signs suggesting potential hospital delirium, unable to attempt to stand due to increased agitation and confusion when attempting     Vision Baseline Vision/History: 0 No visual deficits Ability to See in Adequate Light: 0 Adequate Patient Visual Report: No change from baseline       Perception     Praxis      Pertinent Vitals/Pain Pain Assessment Pain Assessment: Faces Faces Pain Scale: No hurt     Hand Dominance Right   Extremity/Trunk Assessment Upper Extremity Assessment Upper Extremity Assessment: Overall WFL for tasks assessed   Lower Extremity Assessment Lower Extremity Assessment: Defer to PT evaluation;Overall [facility] for tasks assessed   Cervical / Trunk Assessment Cervical / Trunk Assessment: Kyphotic   Communication Communication Communication: No difficulties   Cognition Arousal/Alertness: Lethargic Behavior During Therapy: Restless, Agitated, Anxious, Impulsive Overall Cognitive Status: Impaired/Different from baseline Area of Impairment: Memory, Attention, Orientation, Following commands, Safety/judgement, Awareness, Problem solving                 Orientation Level: Place, Time, Situation Current Attention Level: Sustained Memory: Decreased recall of precautions, Decreased short-term memory Following Commands: Follows one step commands inconsistently, Follows multi-step commands inconsistently Safety/Judgement: Decreased awareness of safety, Decreased awareness of deficits Awareness: Intellectual Problem Solving: Decreased initiation, Difficulty sequencing, Requires verbal cues, Requires tactile cues, Slow processing General Comments: Patient with significant confusion from being up all night, patient unable to follow simple commands, attempting to sit up and exit bed after bed had been risen to assist scooting patient up from the bottom of the bed. OT and PT intervening preventing bed exit     General Comments        Exercises     Shoulder Instructions      Home Living Family/patient expects to be discharged to:: Assisted living                             Home Equipment: Rollator (4 wheels);Cane - single point;Shower seat;Grab bars - tub/shower;Grab bars - toilet;Hand held shower head   Additional Comments: Patient living at assisted living apartment, will confirm information with son      Prior Functioning/Environment Prior Level of Function : Independent/Modified Independent             Mobility Comments: Walking with rollator, unable to observe due to confusion ADLs Comments: Will continue to assess, unable to answer in eval due to confusion        OT Problem List: Decreased strength;Decreased activity tolerance;Impaired balance (sitting and/or standing);Decreased cognition;Decreased safety awareness;Decreased coordination;Decreased knowledge of use of DME or AE;Decreased knowledge of precautions;Cardiopulmonary status limiting activity      OT Treatment/Interventions: Self-care/ADL training;Therapeutic exercise;Energy conservation;Manual therapy;Splinting;Therapeutic activities;Cognitive remediation/compensation;Patient/family education;Balance training    OT Goals(Current goals can be found in the care plan section) Acute Rehab OT Goals Patient Stated Goal: unable to state OT Goal Formulation: Patient unable to participate in goal setting Time For Goal Achievement: 11/24/21 Potential to Achieve Goals: Fair  OT Frequency: Min 2X/week    Co-evaluation              AM-PAC OT "6 Clicks" Daily Activity     Outcome Measure Help from another person eating meals?:  A Little Help from another person taking care of personal grooming?: A Little Help from another person toileting, which includes using toliet, bedpan, or urinal?: A Lot Help from another person bathing (including washing, rinsing, drying)?: A Lot Help from another person to put on and taking off regular  upper body clothing?: A Little Help from another person to put on and taking off regular lower body clothing?: A Lot 6 Click Score: 15   End of Session Equipment Utilized During Treatment: Gait belt;Rolling walker (2 wheels) Nurse Communication: Mobility status;Other (comment) (increasingly confused, may benefit from a sitter)  Activity Tolerance: Treatment limited secondary to agitation;Patient limited by fatigue;Patient limited by lethargy Patient left: in bed;with call bell/phone within reach;with bed alarm set  OT Visit Diagnosis: Unsteadiness on feet (R26.81);Other abnormalities of gait and mobility (R26.89);Other symptoms and signs involving cognitive function                Time: 3086-5784 OT Time Calculation (min): 19 min Charges:  OT General Charges $OT Visit: 1 Visit OT Evaluation $OT Eval Moderate Complexity: 1 Mod  Pollyann Glen E. Jerris Fleer, OTR/L Acute Rehabilitation Services [phone]   Cherlyn Cushing 11/09/2021, 9:29 AM

## 2021-11-09 NOTE — Evaluation (Addendum)
Physical Therapy Evaluation ?Patient Details ?Name: Tara Fields ?MRN: [id number] ?DOB: [birth year] ?Today's Date: 11/09/2021 ? ?History of Present Illness ? Pt is a 88 y.o. female who presented 11/08/21 with SOB, and had a productive cough. Pt admitted with CHF exacerbation. PMH: chronic systolic CHF EF 94-85%, h/o TAVr, PAfib on eliquis, COPD, CAD, CKD 3a, aortic stenosis s/p TAVR ?  ?Clinical Impression ? Pt in bed upon arrival of PT, agreeable to evaluation at this time. The pt was confused and restless this morning upon arrival of PT, attempting to get OOB. However, when offered assistance to ambulate or stand, the pt refused and would impulsively return to supine position. This continued as the pt transitioned from supine to sitting x5, but returned to supine when given opportunity to continue. Suspect due to poor sleep overnight and unfamiliar environment. The pt was able to demo good functional use of UE and good movement of BLE against gravity. VSS mostly stable on RA with bed mobility. Will continue to benefit from skilled PT acutely to further progress OOB mobility and assess for need for O2 or assist with ambulation.  ? ?Addendum: After update from [facility] that the pt is from independent living rather than assisted living and her son has previously only checked on her 4x/week to manage medications, I feel SNF rehab would be the best d/c plan. If the son would like to take the patient home, I strongly recommend frequent-constant supervision and assistance as the pt is unsafe on her own at this time.  ?   ?   ? ?Recommendations for follow up therapy are one component of a multi-disciplinary discharge planning process, led by the attending physician.  Recommendations may be updated based on patient status, additional functional criteria and insurance authorization. ? ?Follow Up Recommendations SNF rehab (< 3 hours /day) ? ?  ?Assistance Recommended at Discharge Frequent or constant Supervision/Assistance   ?Patient can return home with the following ? A little help with walking and/or transfers;A little help with bathing/dressing/bathroom;Direct supervision/assist for medications management;Direct supervision/assist for financial management;Assist for transportation;Help with stairs or ramp for entrance ? ?  ?Equipment Recommendations None recommended by PT  ?Recommendations for Other Services ?    ?  ?Functional Status Assessment Patient has had a recent decline in their functional status and demonstrates the ability to make significant improvements in function in a reasonable and predictable amount of time.  ? ?  ?Precautions / Restrictions Precautions ?Precautions: Fall ?Precaution Comments: Watch O2 ?Restrictions ?Weight Bearing Restrictions: No  ? ?  ? ?Mobility ? Bed Mobility ?Overal bed mobility: Needs Assistance ?Bed Mobility: Supine to Sit, Sit to Supine ?  ?  ?Supine to sit: Min guard ?Sit to supine: Min guard ?  ?General bed mobility comments: Close min gaurd needed with patients impulsivity, nearly hitting bottom bed frame with her head when attempting to take off bathrobe ?  ? ?Transfers ?  ?  ?  ?  ?  ?  ?  ?  ?  ?General transfer comment: Despite multiple attempts patient refusing, could be redirected with each attempt, however would get confused and frustrated despite using memory deficit strategies ?  ? ? ?  ? ?Balance Overall balance assessment: Mild deficits observed, not formally tested ?  ?  ?  ?  ?  ?  ?  ?  ?  ?  ?  ?  ?  ?  ?  ?  ?  ?  ?   ? ? ? ?Pertinent  Vitals/Pain Pain Assessment ?Pain Assessment: Faces ?Faces Pain Scale: No hurt ?Pain Intervention(s): Monitored during session  ? ? ?Home Living Family/patient expects to be discharged to:: Assisted living ?  ?  ?  ?  ?  ?  ?  ?  ?Home Equipment: Rollator (4 wheels);Cane - single point;Shower seat;Grab bars - tub/shower;Grab bars - toilet;Hand held shower head ?Additional Comments: Patient living at assisted living apartment, will confirm  information with son  ?  ?Prior Function Prior Level of Function : Independent/Modified Independent ?  ?  ?  ?  ?  ?  ?Mobility Comments: Walking with rollator, unable to observe due to confusion ?ADLs Comments: Will continue to assess, unable to answer in eval due to confusion ?  ? ? ?Hand Dominance  ? Dominant Hand: Right ? ?  ?Extremity/Trunk Assessment  ? Upper Extremity Assessment ?Upper Extremity Assessment: Overall WFL for tasks assessed ?  ? ?Lower Extremity Assessment ?Lower Extremity Assessment: Generalized weakness (Unable to assess due to cognition, pt moving BLE out of bed and back into bed multiple times through session without issue. no formal MMT or functional assessment in standing) ?  ? ?Cervical / Trunk Assessment ?Cervical / Trunk Assessment: Kyphotic  ?Communication  ? Communication: No difficulties  ?Cognition Arousal/Alertness: Lethargic ?Behavior During Therapy: Restless, Agitated, Anxious, Impulsive ?Overall Cognitive Status: Impaired/Different from baseline ?Area of Impairment: Memory, Attention, Orientation, Following commands, Safety/judgement, Awareness, Problem solving ?  ?  ?  ?  ?  ?  ?  ?  ?Orientation Level: Place, Time, Situation ?Current Attention Level: Sustained ?Memory: Decreased recall of precautions, Decreased short-term memory ?Following Commands: Follows one step commands inconsistently, Follows multi-step commands inconsistently ?Safety/Judgement: Decreased awareness of safety, Decreased awareness of deficits ?Awareness: Intellectual ?Problem Solving: Decreased initiation, Difficulty sequencing, Requires verbal cues, Requires tactile cues, Slow processing ?General Comments: Patient with significant confusion from being up all night, patient unable to follow simple commands, attempting to sit up and exit bed after bed had been risen to assist scooting patient up from the bottom of the bed. OT and PT intervening preventing bed exit ?  ?  ? ?  ?General Comments General  comments (skin integrity, edema, etc.): VSS on RA most of session x2 instances of dropping to 88% but improved to 93% without intervention ? ?  ?Exercises    ? ?Assessment/Plan  ?  ?PT Assessment Patient needs continued PT services  ?PT Problem List Decreased strength;Decreased activity tolerance;Decreased mobility;Decreased balance;Decreased cognition;Decreased safety awareness ? ?   ?  ?PT Treatment Interventions DME instruction;Gait training;Stair training;Functional mobility training;Therapeutic activities;Therapeutic exercise;Balance training;Patient/family education   ? ?PT Goals (Current goals can be found in the Care Plan section)  ?Acute Rehab PT Goals ?Patient Stated Goal: to get some rest ?PT Goal Formulation: With patient ?Time For Goal Achievement: 11/23/21 ?Potential to Achieve Goals: Good ? ?  ?Frequency Min 2X/week ?  ? ? ?Co-evaluation PT/OT/SLP Co-Evaluation/Treatment: Yes ?Reason for Co-Treatment: Necessary to address cognition/behavior during functional activity;For patient/therapist safety;To address functional/ADL transfers ?PT goals addressed during session: Mobility/safety with mobility;Balance;Strengthening/ROM ?  ?  ? ? ?  ?AM-PAC PT "6 Clicks" Mobility  ?Outcome Measure Help needed turning from your back to your side while in a flat bed without using bedrails?: None ?Help needed moving from lying on your back to sitting on the side of a flat bed without using bedrails?: A Little ?Help needed moving to and from a bed to a chair (including a wheelchair)?: A Little ?Help needed standing up  from a chair using your arms (e.g., wheelchair or bedside chair)?: A Little ?Help needed to walk in hospital room?: A Little ?Help needed climbing 3-5 steps with a railing? : A Lot ?6 Click Score: 18 ? ?  ?End of Session   ?Activity Tolerance: Treatment limited secondary to agitation (and confusion) ?Patient left: in bed;with call bell/phone within reach;with bed alarm set (recommendation for sitter) ?Nurse  Communication: Mobility status ?PT Visit Diagnosis: Other abnormalities of gait and mobility (R26.89);Unsteadiness on feet (R26.81);Muscle weakness (generalized) (M62.81) ?  ? ?Time: 4827-0786 ?PT Time Calculation

## 2021-11-09 NOTE — Progress Notes (Signed)
Overnight puls ox started at 2039. Pt on 2L [place] Sat 96%. ?

## 2021-11-09 NOTE — Progress Notes (Signed)
Heart Failure Navigator Progress Note ° °Assessed for Heart & Vascular TOC clinic readiness.  °Patient does not meet criteria due to AHF rounding team consulted this hospitalization.  ° °Navigator available for educational resources. ° °Tara Decaire, MSN, RN °Heart Failure Nurse Navigator °[phone] ° ° °

## 2021-11-09 NOTE — Assessment & Plan Note (Addendum)
Her last echocardiogram from 2022 had a reduced LV systolic function 25 to 56% with global hypokinesis. Preserved RV systolic function. Severe dilatation of left atrium, no significant valvular disease.  ? ?Her hypervolemia has improved. ? ?Blood pressure 110 to 861 mmHg systolic.  ?Plan to continue with metoprolol succinate and spironolactone. ?Continue diuresis with torsemide 40 and 20 mg. ?Rate control atrial fibrillation.  ? ?Acute hypoxemic respiratory failure, likely due to acute cardiogenic pulmonary edema, questionable pneumonia (present on admission). ?Follow up chest radiograph personally reviewed with improvement in bilteral interstitial infiltrates, she has chronic scaring tissue at her left upper lobe.  ?No leukocytosis, no fever, no sputum production.  ?Speech evaluation, continue with regular diet.  ?In the setting of possible urine infection, not clear if procalcitonin will be diagnostic for pneumonia.  ? ?Plan to continue antibiotic therapy, possibly a short course will be sufficient.   ? ?

## 2021-11-09 NOTE — Plan of Care (Signed)

## 2021-11-09 NOTE — Assessment & Plan Note (Addendum)
Continue close blood pressure monitoring. ?Continue with metoprolol and spironolactone.  ?

## 2021-11-09 NOTE — Consult Note (Addendum)
?  ?Advanced Heart Failure Team Consult Note ? ? ?Primary Physician: [name], NP ?PCP-Cardiologist:  Lauree Chandler, MD ? ?Reason for Consultation: Heart Failure  ? ?HPI:   ? ?Tara Fields is seen today for evaluation of heart failure at the request of Dr Cathlean Sauer.  ? ?Ms Tara Fields is a 88 year old with a h/o severe AS s/p TAVR at [place] 03/07/2019.  She developed a pseudoaneurysm post TAVR and had thrombin injection. She has atrial fibrillation and is on Eliquis. She has carotid artery disease.  Cardiac cath March 2019 at [place] with 25% mid LAD stenosis, 50% stenosis second diagonal branch, 50% distal Circumflex stenosis, 75% ostial stenosis of a small Diagonal branch. Echo September 2020 at [place] with LVEF=45-50%. Moderate MR. Bioprosthetic aortic valve stable.  Repeat echo at [place] July 2021 with no change, Trivial perivalvular leak noted. ? ?Admitted to [facility] 12/19/20 with Acute HFrEF. Echo 12/20/20 with LVEF=25-30%. No ischemic evaluation was planned per discussion with Dr. Percival Spanish and the patient. She was diuresed with IV Lasix. Norvasc was stopped.  ? ?She was seen in the ED 01/04/21 with c/o dyspnea. BNP was elevated at 1852 but overall unchanged over last few checks over the last few months. Troponin negative. Lasix increased to 20 mg po BID. COPD, CAD, GERD, persistent atrial fibrillation, DM, HTN, HLD, tobacco abuse and diverticulitis.  ?   ?Patient admitted 01/2021 with A/C HFrEF with IV Lasix.  At d/c placed on torsemide.  ? ?Admitted with A fib RVR and A/C HF. Diuresed with IV lasix. Placed on diltiazem drip and transitioned to Toprol XL 100 mg daily. Discharged 09/05/2021.  ? ?Presented with increased shortness of breath/cough from [facility]. CXR with pulmonary edema. BNP > 1600. HS Trop 9>12. Yesterday diuresed with a total of 100 mg IV lasix.  Over night she was confused. Did not sleep. Her son was stayed with her. Given 40 mg IV lasix this morning. Today she developed A  fib RVR with rate 120-150s and increased work of breathing. Placed on oxygen and started on amio drip with rate controlled. HF Team asked to consult.  ? ? ?Cardiac Testing.  ?Echo 12/2020 - EF 25-30% RV normal. Mild perivalvular leak. AVR is mild. AV mean gradient 12 ?Echo 03/2020 [facility] -The left ventricle is mildly dilated in size with mildly increased wall ?thickness. LVEF is visually estimated at 45-50%.Mild -Mod MR, RV normal. RAmildly dilated in size. ?Well seated #26 S3 Ultra valve in the aortic position. Mild paravalvular ?AI. Mean 13 mm Hg DI 0.32 AVA 1.2-1.3 cm2. ?Echo 2016 EF 55-60%  ? ?LHC 10/2017 at [place] with 25% mid LAD stenosis, 50% stenosis second diagonal branch, 50% distal Circumflex stenosis, 75% ostial stenosis of a small Diagonal branch. ?Review of Systems: [y] = yes, [ ]  = no  ? ?General: Weight gain [ ] ; Weight loss [ ] ; Anorexia [ ] ; Fatigue [ ] ; Fever [ ] ; Chills [ ] ; Weakness [ Y]  ?Cardiac: Chest pain/pressure [ ] ; Resting SOB [ ] ; Exertional SOB [Y ]; Orthopnea [ ] ; Pedal Edema [ ] ; Palpitations [ ] ; Syncope [ ] ; Presyncope [ ] ; Paroxysmal nocturnal dyspnea[ ]   ?Pulmonary: Cough [ ] ; Wheezing[ ] ; Hemoptysis[ ] ; Sputum [ ] ; Snoring [ ]   ?GI: Vomiting[ ] ; Dysphagia[ ] ; Melena[ ] ; Hematochezia [ ] ; Heartburn[ ] ; Abdominal pain [ ] ; Constipation [ ] ; Diarrhea [ ] ; BRBPR [ ]   ?GU: Hematuria[ ] ; Dysuria [ ] ; Nocturia[ ]   ?Vascular: Pain in legs with  walking [ ] ; Pain in feet with lying flat [ ] ; Non-healing sores [ ] ; Stroke [ ] ; TIA [ ] ; Slurred speech [ ] ;  ?Neuro: Headaches[ ] ; Vertigo[ ] ; Seizures[ ] ; Paresthesias[ ] ;Blurred vision [ ] ; Diplopia [ ] ; Vision changes [ ]   ?Ortho/Skin: Arthritis [ ] ; Joint pain [Y ]; Muscle pain [ ] ; Joint swelling [ ] ; Back Pain [Y ]; Rash [ ]   ?Psych: Depression[ ] ; Anxiety[ ]   ?Heme: Bleeding problems [ ] ; Clotting disorders [ ] ; Anemia [ ]   ?Endocrine: Diabetes [ Y]; Thyroid dysfunction[ ]  ? ?Home Medications ?Prior to Admission medications   ?Medication  Sig Start Date End Date Taking? Authorizing Provider  ?ELIQUIS 5 MG TABS tablet TAKE 1 TABLET BY MOUTH TWICE A DAY ?Patient taking differently: Take 5 mg by mouth 2 (two) times daily. 10/27/21  Yes Nafziger, Tommi Rumps, NP  ?metoprolol succinate (TOPROL-XL) 100 MG 24 hr tablet Take 1 tablet (100 mg total) by mouth daily. Take with or immediately following a meal. 08/18/21 12/19/21 Yes Nafziger, Tommi Rumps, NP  ?acetaminophen (TYLENOL) 500 MG tablet Take 500 mg by mouth 3 (three) times daily as needed for headache (pain). Take with tramadol    [provider]  ?albuterol (VENTOLIN HFA) 108 (90 Base) MCG/ACT inhaler Inhale 2 puffs into the lungs every 6 (six) hours as needed for wheezing or shortness of breath. 06/19/21   Nafziger, Tommi Rumps, NP  ?atorvastatin (LIPITOR) 40 MG tablet TAKE 1 TABLET BY MOUTH DAILY AT 6 PM. ?Patient taking differently: Take 40 mg by mouth every evening. 08/28/21   Nafziger, Tommi Rumps, NP  ?cholecalciferol (VITAMIN D3) 25 MCG (1000 UNIT) tablet Take 1,000 Units by mouth 2 (two) times daily.    [provider]  ?Coenzyme Q10 (CO Q-10) 300 MG CAPS Take 300 mg by mouth in the morning.    [provider]  ?[facility] ASPIRIN ADULT LOW DOSE 81 MG chewable tablet CHEW 1 TABLET (81 MG TOTAL) BY MOUTH DAILY. 03/10/21   [name], NP  ?fenofibrate 160 MG tablet  07/08/21   [provider]  ?Ferrous Gluconate 324 (37.5 Fe) MG TABS Take 1 tablet (324 mg total) by mouth daily with breakfast. 09/09/21   [name], NP  ?HM LIDOCAINE PATCH EX Apply 1 patch topically daily as needed (for back pain).    [provider]  ?Lidocaine-Menthol, Spray, 4-1 % LIQD Apply 1 spray topically daily as needed (back/knee pain/restless legs).    [provider]  ?losartan (COZAAR) 25 MG tablet Take 1 tablet (25 mg total) by mouth daily. 05/29/21   [name], NP  ?Magnesium 200 MG TABS Take 200 mg by mouth every morning.    [provider]  ?Multiple Vitamin (MULTIVITAMIN WITH  MINERALS) TABS tablet Take 1 tablet by mouth 2 (two) times daily. Centrum Minis    [provider]  ?pantoprazole (PROTONIX) 40 MG tablet  07/08/21   [provider]  ?potassium chloride 20 MEQ/15ML (10%) SOLN Take 15 mLs (20 mEq total) by mouth daily. 05/19/21 09/07/21  [name], PA-C  ?sodium chloride (OCEAN) 0.65 % SOLN nasal spray Place 1 spray into both nostrils daily as needed for congestion.    [provider]  ?Spacer/Aero-Holding Chambers DEVI Use with inhaler 06/19/21   [name], NP  ?terazosin (HYTRIN) 10 MG capsule TAKE 1 CAPSULE BY MOUTH EVERY DAY AT 12 NOON ?Patient taking differently: Take 10 mg by mouth at bedtime. 07/14/21   Nafziger, Tommi Rumps, NP  ?torsemide (DEMADEX) 20 MG tablet  Take 2 tablets (40 mg total) by mouth daily. 06/23/21   [name], PA-C  ?traMADol (ULTRAM) 50 MG tablet TAKE 1 TABLET (50 MG TOTAL) BY MOUTH IN THE MORNING, AT NOON, AND AT BEDTIME. ?Patient taking differently: Take 50 mg by mouth 3 (three) times daily. 07/30/21   Nafziger, Tommi Rumps, NP  ?vitamin B-12 (CYANOCOBALAMIN) 1000 MCG tablet Take 1 tablet (1,000 mcg total) by mouth daily. 04/16/21   Oswald Hillock, MD  ? ? ?Past Medical History: ?Past Medical History:  ?Diagnosis Date  ? Aortic stenosis   ? s/p TAVR 26 mm Edwards Sapien 3 THV July 2020  ? CAD (coronary artery disease)   ? COPD (chronic obstructive pulmonary disease) ([place])   ? Diabetes mellitus ([place])   ? Diverticulitis   ? GERD (gastroesophageal reflux disease)   ? HTN (hypertension)   ? Hyperlipidemia   ? Legally blind in left eye, as defined in [place]   ? Tobacco abuse   ? ? ?Past Surgical History: ?Past Surgical History:  ?Procedure Laterality Date  ? ABDOMINAL HYSTERECTOMY    ? APPENDECTOMY    ? BACK SURGERY    ? CATARACT EXTRACTION, BILATERAL    ? CHOLECYSTECTOMY    ? COLONOSCOPY WITH PROPOFOL N/A 04/16/2021  ? Procedure: COLONOSCOPY WITH PROPOFOL;  Surgeon: Milus Banister, MD;  Location: [facility];  Service:  Gastroenterology;  Laterality: N/A;  ? ESOPHAGOGASTRODUODENOSCOPY (EGD) WITH PROPOFOL N/A 04/15/2021  ? Procedure: ESOPHAGOGASTRODUODENOSCOPY (EGD) WITH PROPOFOL;  Surgeon: Doran Stabler, MD;  Location: [facility];

## 2021-11-09 NOTE — Evaluation (Signed)
Clinical/Bedside Swallow Evaluation ?Patient Details  ?Name: Tara Fields ?MRN: [id number] ?Date of Birth: Oct 28, 1927 ? ?Today's Date: 11/09/2021 ?Time: SLP Start Time (ACUTE ONLY): 1250 SLP Stop Time (ACUTE ONLY): 1300 ?SLP Time Calculation (min) (ACUTE ONLY): 10 min ? ?Past Medical History:  ?Past Medical History:  ?Diagnosis Date  ? Aortic stenosis   ? s/p TAVR 26 mm Edwards Sapien 3 THV July 2020  ? CAD (coronary artery disease)   ? COPD (chronic obstructive pulmonary disease) ([place])   ? Diabetes mellitus ([place])   ? Diverticulitis   ? GERD (gastroesophageal reflux disease)   ? HTN (hypertension)   ? Hyperlipidemia   ? Legally blind in left eye, as defined in [place]   ? Tobacco abuse   ? ?Past Surgical History:  ?Past Surgical History:  ?Procedure Laterality Date  ? ABDOMINAL HYSTERECTOMY    ? APPENDECTOMY    ? BACK SURGERY    ? CATARACT EXTRACTION, BILATERAL    ? CHOLECYSTECTOMY    ? COLONOSCOPY WITH PROPOFOL N/A 04/16/2021  ? Procedure: COLONOSCOPY WITH PROPOFOL;  Surgeon: Milus Banister, MD;  Location: [facility];  Service: Gastroenterology;  Laterality: N/A;  ? ESOPHAGOGASTRODUODENOSCOPY (EGD) WITH PROPOFOL N/A 04/15/2021  ? Procedure: ESOPHAGOGASTRODUODENOSCOPY (EGD) WITH PROPOFOL;  Surgeon: Doran Stabler, MD;  Location: [facility];  Service: Gastroenterology;  Laterality: N/A;  ? ?HPI:  ?Pt is a 88 y.o. female who presented 11/08/21 with SOB, and had a productive cough. Pt admitted with CHF exacerbation. PMH: chronic systolic CHF EF 32-20%, h/o TAVr, PAfib on eliquis, COPD, CAD, CKD 3a, aortic stenosis s/p TAVR  ?  ?Assessment / Plan / Recommendation  ?Clinical Impression ? Pt demonstrates significant drowsiness and even altered mental status impacting awareness of PO. Pt is able to take sips of water without signs of aspiration, but she cant sustain arousal or attention long enough to consume regular solids. Pt had tray in front of her with only one bite of chicken consumed. WHen SLP offered a small  piece of graham cracker pt held it in her cheek and needed verbal cues to complete any mastication and eventually total assist to remove it from her buccal cavity despite verbal cues. Likely pt typically eats and drinks with minimal risk, but her lack of sleep and wakeful fatigue could potential be impacting her safety. Recommend downgrade to dys 2/thin liquids with assist from staff for meals; will f/u to upgrade when fully alert and self feeding again. ?SLP Visit Diagnosis: Dysphagia, unspecified (R13.10) ?   ?Aspiration Risk ? Mild aspiration risk  ?  ?Diet Recommendation Dysphagia 2 (Fine chop);Thin liquid  ? ?Liquid Administration via: Cup;Straw ?Medication Administration: Whole meds with liquid ?Supervision: Staff to assist with self feeding ?Compensations: Minimize environmental distractions;Lingual sweep for clearance of pocketing ?Postural Changes: Seated upright at 90 degrees  ?  ?Other  Recommendations     ? ?Recommendations for follow up therapy are one component of a multi-disciplinary discharge planning process, led by the attending physician.  Recommendations may be updated based on patient status, additional functional criteria and insurance authorization. ? ?Follow up Recommendations No SLP follow up  ? ? ?  ?Assistance Recommended at Discharge Intermittent Supervision/Assistance  ?Functional Status Assessment Patient has had a recent decline in their functional status and demonstrates the ability to make significant improvements in function in a reasonable and predictable amount of time.  ?Frequency and Duration    ?  ?  ?   ? ?Prognosis    ? ?  ? ?  Swallow Study   ?General HPI: Pt is a 88 y.o. female who presented 11/08/21 with SOB, and had a productive cough. Pt admitted with CHF exacerbation. PMH: chronic systolic CHF EF 89-38%, h/o TAVr, PAfib on eliquis, COPD, CAD, CKD 3a, aortic stenosis s/p TAVR ?Type of Study: Bedside Swallow Evaluation ?Previous Swallow Assessment: none ?Diet Prior to this  Study: Regular;Thin liquids ?Temperature Spikes Noted: No ?Respiratory Status: Nasal cannula ?History of Recent Intubation: No ?Behavior/Cognition: Lethargic/Drowsy;Requires cueing;Distractible ?Oral Cavity Assessment: Dry ?Oral Care Completed by SLP: No ?Oral Cavity - Dentition: Dentures, top;Dentures, bottom ?Vision: Functional for self-feeding ?Self-Feeding Abilities: Needs assist ?Patient Positioning: Upright in bed ?Baseline Vocal Quality: Normal ?Volitional Cough: Congested  ?  ?Oral/Motor/Sensory Function Overall Oral Motor/Sensory Function: Within functional limits   ?Ice Chips     ?Thin Liquid Thin Liquid: Within functional limits ?Presentation: Straw  ?  ?Nectar Thick Nectar Thick Liquid: Not tested   ?Honey Thick Honey Thick Liquid: Not tested   ?Puree Puree: Within functional limits   ?Solid ? ? ?  Solid: Impaired ?Presentation: Self Fed ?Oral Phase Impairments: Impaired mastication;Poor awareness of bolus;Reduced lingual movement/coordination ?Oral Phase Functional Implications: Left lateral sulci pocketing;Oral holding;Oral residue  ? ?  ? ?Emeline Simpson, Katherene Ponto ?11/09/2021,1:14 PM ? ? ? ?

## 2021-11-10 ENCOUNTER — Other Ambulatory Visit (HOSPITAL_COMMUNITY): Payer: Self-pay

## 2021-11-10 ENCOUNTER — Inpatient Hospital Stay (HOSPITAL_COMMUNITY): Payer: Medicare Other

## 2021-11-10 DIAGNOSIS — N390 Urinary tract infection, site not specified: Secondary | ICD-10-CM

## 2021-11-10 DIAGNOSIS — I10 Essential (primary) hypertension: Secondary | ICD-10-CM | POA: Diagnosis not present

## 2021-11-10 DIAGNOSIS — E1169 Type 2 diabetes mellitus with other specified complication: Secondary | ICD-10-CM

## 2021-11-10 DIAGNOSIS — E785 Hyperlipidemia, unspecified: Secondary | ICD-10-CM

## 2021-11-10 DIAGNOSIS — I5023 Acute on chronic systolic (congestive) heart failure: Secondary | ICD-10-CM | POA: Diagnosis not present

## 2021-11-10 DIAGNOSIS — I4819 Other persistent atrial fibrillation: Secondary | ICD-10-CM | POA: Diagnosis not present

## 2021-11-10 DIAGNOSIS — N183 Chronic kidney disease, stage 3 unspecified: Secondary | ICD-10-CM | POA: Diagnosis not present

## 2021-11-10 HISTORY — DX: Urinary tract infection, site not specified: N39.0

## 2021-11-10 LAB — CBC
HCT: 33.3 % — ABNORMAL LOW (ref 36.0–46.0)
Hemoglobin: 10.5 g/dL — ABNORMAL LOW (ref 12.0–15.0)
MCH: 29.7 pg (ref 26.0–34.0)
MCHC: 31.5 g/dL (ref 30.0–36.0)
MCV: 94.1 fL (ref 80.0–100.0)
Platelets: 207 10*3/uL (ref 150–400)
RBC: 3.54 MIL/uL — ABNORMAL LOW (ref 3.87–5.11)
RDW: 15.5 % (ref 11.5–15.5)
WBC: 7 10*3/uL (ref 4.0–10.5)
nRBC: 0 % (ref 0.0–0.2)

## 2021-11-10 LAB — BASIC METABOLIC PANEL
Anion gap: 9 (ref 5–15)
BUN: 35 mg/dL — ABNORMAL HIGH (ref 8–23)
CO2: 28 mmol/L (ref 22–32)
Calcium: 8.9 mg/dL (ref 8.9–10.3)
Chloride: 100 mmol/L (ref 98–111)
Creatinine, Ser: 1.07 mg/dL — ABNORMAL HIGH (ref 0.44–1.00)
GFR, Estimated: 49 mL/min — ABNORMAL LOW (ref >60)
Glucose, Bld: 162 mg/dL — ABNORMAL HIGH (ref 70–99)
Potassium: 3.6 mmol/L (ref 3.5–5.1)
Sodium: 137 mmol/L (ref 135–145)

## 2021-11-10 LAB — RETICULOCYTES
Immature Retic Fract: 23.5 % — ABNORMAL HIGH (ref 2.3–15.9)
RBC.: 3.56 MIL/uL — ABNORMAL LOW (ref 3.87–5.11)
Retic Count, Absolute: 72.3 10*3/uL (ref 19.0–186.0)
Retic Ct Pct: 2 % (ref 0.4–3.1)

## 2021-11-10 LAB — IRON AND TIBC
Iron: 22 ug/dL — ABNORMAL LOW (ref 28–170)
Saturation Ratios: 6 % — ABNORMAL LOW (ref 10.4–31.8)
TIBC: 346 ug/dL (ref 250–450)
UIBC: 324 ug/dL

## 2021-11-10 LAB — VITAMIN B12: Vitamin B-12: 1589 pg/mL — ABNORMAL HIGH (ref 180–914)

## 2021-11-10 LAB — FOLATE: Folate: 35.2 ng/mL (ref >5.9)

## 2021-11-10 LAB — MAGNESIUM: Magnesium: 2.1 mg/dL (ref 1.7–2.4)

## 2021-11-10 LAB — FERRITIN: Ferritin: 145 ng/mL (ref 11–307)

## 2021-11-10 LAB — TRANSFERRIN: Transferrin: 237 mg/dL (ref 192–382)

## 2021-11-10 MED ORDER — POTASSIUM CHLORIDE CRYS ER 20 MEQ PO TBCR
40.0000 meq | EXTENDED_RELEASE_TABLET | Freq: Once | ORAL | Status: AC
  Administered 2021-11-10: 40 meq via ORAL
  Filled 2021-11-10: qty 2

## 2021-11-10 MED ORDER — SODIUM CHLORIDE 0.9 % IV SOLN
2.0000 g | INTRAVENOUS | Status: DC
  Administered 2021-11-10 – 2021-11-13 (×4): 2 g via INTRAVENOUS
  Filled 2021-11-10 (×4): qty 20

## 2021-11-10 MED ORDER — METOPROLOL SUCCINATE ER 50 MG PO TB24
50.0000 mg | ORAL_TABLET | Freq: Two times a day (BID) | ORAL | Status: DC
Start: 2021-11-10 — End: 2021-11-15
  Administered 2021-11-10 – 2021-11-15 (×10): 50 mg via ORAL
  Filled 2021-11-10 (×12): qty 1

## 2021-11-10 MED ORDER — AMIODARONE HCL IN DEXTROSE 360-4.14 MG/200ML-% IV SOLN
30.0000 mg/h | INTRAVENOUS | Status: DC
  Administered 2021-11-10 (×2): 30 mg/h via INTRAVENOUS
  Filled 2021-11-10 (×2): qty 200

## 2021-11-10 NOTE — Progress Notes (Addendum)
? ? Advanced Heart Failure Rounding Note ? ?PCP-Cardiologist: Tara Chandler, MD  ? ?Subjective:   ? ? ?CXR yesterday with new densities left upper lung and left perihilar region. Notes productive cough with yellow sputum. AF. No leukocytosis yesterday. ? ?Reports fatigue. Hasn't slept well last couple of days. No dyspnea at rest. ? ?Scr stable.  ? ?Remains on amio gtt, rates 70s-80s ? ? ?Objective:   ?Weight Range: ?66.1 kg ?Body mass index is 28.46 kg/m?.  ? ?Vital Signs:   ?Temp:  [98.3 ?F (36.8 ?C)] 98.3 ?F (36.8 ?C) (03/27 1928) ?Pulse Rate:  [70-119] 70 (03/28 0402) ?Resp:  [14-26] 17 (03/28 0402) ?BP: (110-140)/(59-78) 110/65 (03/28 0402) ?SpO2:  [93 %-100 %] 94 % (03/28 0402) ?Weight:  [66.1 kg] 66.1 kg (03/28 0402) ?Last BM Date : 11/08/21 ? ?Weight change: ?Filed Weights  ? 11/08/21 1328 11/09/21 0542 11/10/21 0402  ?Weight: 64.2 kg 65.2 kg 66.1 kg  ? ? ?Intake/Output:  ? ?Intake/Output Summary (Last 24 hours) at 11/10/2021 0813 ?Last data filed at 11/10/2021 [phone] ?Gross per 24 hour  ?Intake 1186.29 ml  ?Output 100 ml  ?Net 1086.29 ml  ?  ? ? ?Physical Exam  ?  ?General:  Elderly female. Appears weak. Sitting up in bed. ?HEENT: Normal ?Neck: Supple. JVP not elevated. Carotids 2+ bilat; no bruits.  ?Cor: PMI nondisplaced. Irregular rhythm. No rubs, gallops, 2/6 SEM RUSB ?Lungs: Course with expiratory wheezes ?Abdomen: Soft, nontender, nondistended. No hepatosplenomegaly. No bruits or masses. Good bowel sounds. ?Extremities: No cyanosis, clubbing, rash, edema ?Neuro: Alert & orientedx3, cranial nerves grossly intact. moves all 4 extremities w/o difficulty. Affect pleasant ? ? ?Telemetry  ? ?AF 70s-80s ? ? ?Labs  ?  ?CBC ?Recent Labs  ?  11/08/21 ?7124 11/09/21 ?0351  ?WBC 9.9 7.6  ?HGB 10.9* 10.9*  ?HCT 35.0* 33.1*  ?MCV 93.1 93.0  ?PLT 224 203  ? ?Basic Metabolic Panel ?Recent Labs  ?  11/08/21 ?5809 11/09/21 ?9833 11/10/21 ?8250  ?NA 139 138 137  ?K 4.1 3.5 3.6  ?CL 103 104 100  ?CO2 22 22 28    ?GLUCOSE 182* 166* 162*  ?BUN 38* 35* 35*  ?CREATININE 1.17* 1.08* 1.07*  ?CALCIUM 9.3 8.8* 8.9  ?MG 2.2  --  2.1  ? ?Liver Function Tests ?Recent Labs  ?  11/08/21 ?5397  ?AST 21  ?ALT 12  ?ALKPHOS 61  ?BILITOT 0.8  ?PROT 6.9  ?ALBUMIN 3.9  ? ?No results for input(s): LIPASE, AMYLASE in the last 72 hours. ?Cardiac Enzymes ?No results for input(s): CKTOTAL, CKMB, CKMBINDEX, TROPONINI in the last 72 hours. ? ?BNP: ?BNP (last 3 results) ?Recent Labs  ?  06/07/21 ?0955 09/02/21 ?6734 11/08/21 ?1937  ?BNP 3,284.2* 1,377.5* 1,638.3*  ? ? ?ProBNP (last 3 results) ?No results for input(s): PROBNP in the last 8760 hours. ? ? ?D-Dimer ?No results for input(s): DDIMER in the last 72 hours. ?Hemoglobin A1C ?No results for input(s): HGBA1C in the last 72 hours. ?Fasting Lipid Panel ?No results for input(s): CHOL, HDL, LDLCALC, TRIG, CHOLHDL, LDLDIRECT in the last 72 hours. ?Thyroid Function Tests ?No results for input(s): TSH, T4TOTAL, T3FREE, THYROIDAB in the last 72 hours. ? ?Invalid input(s): FREET3 ? ?Other results: ? ? ?Imaging  ? ? ?DG CHEST PORT 1 VIEW ? ?Result Date: 11/09/2021 ?CLINICAL DATA:  Dyspnea. EXAM: PORTABLE CHEST 1 VIEW COMPARISON:  11/08/2021 FINDINGS: Again noted is cardiomegaly and post TAVR. Persistent prominent interstitial lung markings with increased densities in the left upper lung. There may  be slightly increased densities at the left lung base. Trachea is midline. IMPRESSION: Chronic lung changes with new densities in the left upper lung and left suprahilar region. Findings may be associated with atelectasis based on the rapid change but infection cannot be excluded. Stable cardiomegaly. Electronically Signed   By: Markus Daft M.D.   On: 11/09/2021 13:06  ? ?ECHOCARDIOGRAM COMPLETE ? ?Result Date: 11/09/2021 ?   ECHOCARDIOGRAM REPORT   Patient Name:   Tara Fields Date of Exam: 11/09/2021 Medical Rec #:  [id number]          Height:       60.0 in Accession #:    [id number]         Weight:        143.7 lb Date of Birth:  07-24-1928           BSA:          1.622 m? Patient Age:    88 years           BP:           140/68 mmHg Patient Gender: F                  HR:           90 bpm. Exam Location:  Inpatient Procedure: 2D Echo, Color Doppler, Cardiac Doppler and Intracardiac            Opacification Agent Indications:    R15.40 Acute systolic (congestive) heart failure  History:        Patient has prior history of Echocardiogram examinations, most                 recent 12/20/2020. CHF, COPD; Risk Factors:Hypertension, Diabetes                 and Dyslipidemia.                 Aortic Valve: 26 mm Sapien prosthetic, stented (TAVR) valve is                 present in the aortic position. Procedure Date: 03/07/19.  Sonographer:    Raquel Sarna Senior RDCS Referring Phys: [id number] JENNIFER YATES  Sonographer Comments: Full AS work-up not completed due to patient condition and irregular rhythm IMPRESSIONS  1. S/P TAVR with mild perivalvular AI; no change compared to previous.  2. Left ventricular ejection fraction, by estimation, is 20 to 25%. The left ventricle has severely decreased function. The left ventricle demonstrates global hypokinesis. The left ventricular internal cavity size was severely dilated. Left ventricular diastolic function could not be evaluated.  3. Right ventricular systolic function is normal. The right ventricular size is normal. There is moderately elevated pulmonary artery systolic pressure.  4. Left atrial size was severely dilated.  5. Right atrial size was severely dilated.  6. The mitral valve is abnormal. Mild mitral valve regurgitation. No evidence of mitral stenosis.  7. The aortic valve has been repaired/replaced. Aortic valve regurgitation is mild. No aortic stenosis is present. There is a 26 mm Sapien prosthetic (TAVR) valve present in the aortic position. Procedure Date: 03/07/19.  8. The inferior vena cava is dilated in size with <50% respiratory variability, suggesting right atrial pressure of  15 mmHg. FINDINGS  Left Ventricle: Left ventricular ejection fraction, by estimation, is 20 to 25%. The left ventricle has severely decreased function. The left ventricle demonstrates global hypokinesis. Definity contrast agent was given IV to delineate the left ventricular endocardial  borders. The left ventricular internal cavity size was severely dilated. There is no left ventricular hypertrophy. Abnormal (paradoxical) septal motion, consistent with left bundle branch block. Left ventricular diastolic function  could not be evaluated due to atrial fibrillation. Left ventricular diastolic function could not be evaluated. Right Ventricle: The right ventricular size is normal. Right ventricular systolic function is normal. There is moderately elevated pulmonary artery systolic pressure. The tricuspid regurgitant velocity is 3.08 m/s, and with an assumed right atrial pressure of 15 mmHg, the estimated right ventricular systolic pressure is 10.3 mmHg. Left Atrium: Left atrial size was severely dilated. Right Atrium: Right atrial size was severely dilated. Pericardium: There is no evidence of pericardial effusion. Mitral Valve: The mitral valve is abnormal. There is moderate thickening of the mitral valve leaflet(s). Mild mitral annular calcification. Mild mitral valve regurgitation. No evidence of mitral valve stenosis. Tricuspid Valve: The tricuspid valve is normal in structure. Tricuspid valve regurgitation is mild . No evidence of tricuspid stenosis. Aortic Valve: The aortic valve has been repaired/replaced. Aortic valve regurgitation is mild. No aortic stenosis is present. Aortic valve mean gradient measures 6.7 mmHg. Aortic valve peak gradient measures 12.9 mmHg. Aortic valve area, by VTI measures 2.06 cm?[name] There is a 26 mm Sapien prosthetic, stented (TAVR) valve present in the aortic position. Procedure Date: 03/07/19. Pulmonic Valve: The pulmonic valve was normal in structure. Pulmonic valve regurgitation is  mild. No evidence of pulmonic stenosis. Aorta: The aortic root is normal in size and structure. Venous: The inferior vena cava is dilated in size with less than 50% respiratory variability, suggesting right atrial

## 2021-11-10 NOTE — TOC Benefit Eligibility Note (Signed)
Patient Advocate Encounter ? ?Insurance verification completed.   ? ?The patient is currently admitted and upon discharge could be taking Farxiga 10 mg. ? ?The current 30 day co-pay is, $30.00.  ? ?The patient is currently admitted and upon discharge could be taking Jardiance 10 mg. ? ?The current 30 day co-pay is, $30.00.  ? ?The patient is insured through [facility] Part D  ? ? ? ?Lyndel Safe, CPhT ?Pharmacy Patient Advocate Specialist ?[facility] Patient Advocate Team ?Direct Number: [phone]  Fax: [phone] ? ? ? ? ? ?  ?

## 2021-11-10 NOTE — Progress Notes (Addendum)
?Progress Note ? ? ?PatientDeniqua Fields CLE:[id number] DOB: [birth year] DOA: 11/08/2021     2 ?DOS: the patient was seen and examined on 11/10/2021 ?  ?Brief hospital course: ?Tara Fields was admitted to the hospital with the working diagnosis of decompensated heart failure. ?Atrial fibrillation with RVR.  ? ?88 yo female with the past medical history of coronary artery disease, AS sp TAVR, COPD, hypertension, T2DM, and atrial fibrillation who presented with dyspnea. Patient had a recent URI, consistent with productive cough, the night prior to hospitalization she had a acute worsening of dyspnea, prompted her to come to the ED. On her initial physical examination her blood pressure was 104/54, HR 85, RR 22, oxygen saturation 97%, lungs with no rales or wheezing (after furosemide ED), heart with S1 and S2 present and rhythmic, abdomen not distended and no lower extremity edema.  ? ?Na 139, K 4.1 Cl 103, bicarbonate 22, glucose 182, bun 38 cr 1,17 ?BNP 1,638  ?High sensitive troponin 9 and 12 ?Wbc 9,9 hgb 10,9, hct 35,0 plt 224  ? ?Chest radiograph with cardiomegaly with bilateral hilar vascular congestion.  ? ?EKG 109 bpm, left axis deviation, right bundle branch block, qtc 538, atrial fibrillation, with q wave lead II, III, AvF, J point elevatin v2 to v4, with no significant T wave changes.  ? ?Patient was placed on furosemide for diuresis. ?Patient with atrial fibrillation with RVR started on amiodarone for rate control.  ? ?Assessment and Plan: ?* Acute on chronic systolic CHF (congestive heart failure) ([place]) ?Her last echocardiogram from 2022 had a reduced LV systolic function 25 to 94% with global hypokinesis. Preserved RV systolic function. Severe dilatation of left atrium, no significant valvular disease.  ? ?Her hypervolemia has improved. ? ?Blood pressure 110 to 496 mmHg systolic.  ?Plan to continue with metoprolol succinate and spironolactone. ?Continue diuresis with torsemide 40 and 20 mg. ?Rate  control atrial fibrillation.  ? ?Acute hypoxemic respiratory failure, likely due to acute cardiogenic pulmonary edema, questionable pneumonia (present on admission). ?Follow up chest radiograph personally reviewed with improvement in bilteral interstitial infiltrates, she has chronic scaring tissue at her left upper lobe.  ?No leukocytosis, no fever, no sputum production.  ?Speech evaluation, continue with regular diet.  ?In the setting of possible urine infection, not clear if procalcitonin will be diagnostic for pneumonia.  ? ?Plan to continue antibiotic therapy, possibly a short course will be sufficient.   ? ? ?Persistent atrial fibrillation ([place]) ?Atrial fibrillation with rapid ventricular response.  ? ?Rate has improved with amiodarone drip, continue metoprolol succinate. ?Anticoagulation with apixaban.  ?Continue telemetry monitoring. ?Encourage out of bed to chair tid with meals, follow with PT and OT recommendations.  ? ?CKD (chronic kidney disease), stage III ([place]) ?CKD stage 3a, hypokalemia. ? ?Renal function with serum cr at 1,0 with K at 3,6 and serum bicarbonate at 28.  ?Continue close monitoring of renal function and electrolytes. ?Diuresis with torsemide.  ? ? ? ?HTN (hypertension) ?Continue close blood pressure monitoring. ?Continue with metoprolol and spironolactone.  ? ?Type 2 diabetes mellitus with hyperlipidemia ([place]) ?Fasting glucose is 162, continue glucose cover and monitoring with insulin sliding scale.  ?Continue with statin therapy.  ? ?Cognitive deficits ?No agitation, continue neuro checks per unit protocol. ?No clinical encephalopathy.  ?Discontinue morphine.  ? ?Malnutrition of moderate degree ?Continue with nutritional supplementation.  ? ?UTI (urinary tract infection) ?Patient with foul smelling urine, urine analysis with some pyuria.  ?Plan to treat with ceftriaxone.  ? ? ? ? ?  ? ?  Subjective: patient feeling better, dyspnea has improved, positive dry cough and rib cage pain at the  right side.  ? ?Physical Exam: ?Vitals:  ? 11/09/21 2100 11/10/21 0055 11/10/21 0402 11/10/21 1123  ?BP: 120/70 128/68 110/65 110/73  ?Pulse: 85 89 70 93  ?Resp: 17 (!) 21 17 17   ?Temp:    98.1 ?F (36.7 ?C)  ?TempSrc:    Oral  ?SpO2: 100% 94% 94% 98%  ?Weight:   66.1 kg   ?Height:      ? ?Neurology awake and alert ?ENT with mild pallor ?Cardiovascular with S1 and S2 present, irregularly irregular with no gallops, murmurs or rubs ?No JVD ?No lower extremity edema ?Respiratory with no significant rales, no rhonchi and no wheezing ?Abdomen not distended ?Skin with no rashes ?Positive pain to palpation at the right lower rib cage  ?Data Reviewed: ? ? ? ?Family Communication: no family a the bedside. I was not able to reach her son over the phone, I left a message.  ? ?Disposition: ?Status is: Inpatient ?Remains inpatient appropriate because: heart failure and atrial fibrillation  ? Planned Discharge Destination: Home ? ? ? ? ?Author: ?Tawni Millers, MD ?11/10/2021 4:14 PM ? ?For on call review [email or web address].  ?

## 2021-11-10 NOTE — Progress Notes (Signed)
Physical Therapy Treatment ?Patient Details ?Name: Tara Fields ?MRN: [id number] ?DOB: 1928/07/23 ?Today's Date: 11/10/2021 ? ? ?History of Present Illness Pt is a 88 y.o. female who presented 11/08/21 with SOB, and had a productive cough. Pt admitted with CHF exacerbation. PMH: chronic systolic CHF EF 11-94%, h/o TAVr, PAfib on eliquis, COPD, CAD, CKD 3a, aortic stenosis s/p TAVR ? ?  ?PT Comments  ? ? Pt admitted with above diagnosis. Pt was able to ambulate with RW in room and with min assist and mod cues for safety as she continues to be impulsive at times. Pt would benefit from SNF if she doesn't have 24 hour care.  Pt currently with functional limitations due to balance and endurance deficits. Pt will benefit from skilled PT to increase their independence and safety with mobility to allow discharge to the venue listed below.      ?Recommendations for follow up therapy are one component of a multi-disciplinary discharge planning process, led by the attending physician.  Recommendations may be updated based on patient status, additional functional criteria and insurance authorization. ? ?Follow Up Recommendations ? Skilled nursing-short term rehab (<3 hours/day) (HH if 24 hour care) ?  ?  ?Assistance Recommended at Discharge Frequent or constant Supervision/Assistance  ?Patient can return home with the following A little help with walking and/or transfers;A little help with bathing/dressing/bathroom;Direct supervision/assist for medications management;Direct supervision/assist for financial management;Assist for transportation;Help with stairs or ramp for entrance ?  ?Equipment Recommendations ? None recommended by PT  ?  ?Recommendations for Other Services   ? ? ?  ?Precautions / Restrictions Precautions ?Precautions: Fall ?Precaution Comments: Watch O2 ?Restrictions ?Weight Bearing Restrictions: No  ?  ? ?Mobility ? Bed Mobility ?Overal bed mobility: Needs Assistance ?Bed Mobility: Supine to Sit, Sit to  Supine ?  ?  ?Supine to sit: Min guard ?  ?  ?General bed mobility comments: Close min gaurd needed with patients impulsivity ?  ? ?Transfers ?Overall transfer level: Needs assistance ?Equipment used: Rolling walker (2 wheels) ?Transfers: Sit to/from Stand, Bed to chair/wheelchair/BSC ?Sit to Stand: Min assist ?  ?Step pivot transfers: Min assist ?  ?  ?  ?General transfer comment: Pt was able to stand with cues for hand placement and pivoted to the 3N1 with use of RW.  Pt urinated and PT assisted her in cleaning prior to pt walking. ?  ? ?Ambulation/Gait ?Ambulation/Gait assistance: Min guard, Min assist ?Gait Distance (Feet): 45 Feet ?Assistive device: Rolling walker (2 wheels) ?Gait Pattern/deviations: Step-through pattern, Decreased stride length, Drifts right/left, Trunk flexed, Wide base of support, Leaning posteriorly ?  ?Gait velocity interpretation: <1.31 ft/sec, indicative of household ambulator ?  ?General Gait Details: Pt needed min assist throughout due to needing cues for safety with use of RW.Needed assist for proximity to RW and steering RW. ? ? ?Stairs ?  ?  ?  ?  ?  ? ? ?Wheelchair Mobility ?  ? ?Modified Rankin (Stroke Patients Only) ?  ? ? ?  ?Balance Overall balance assessment: Mild deficits observed, not formally tested ?  ?  ?  ?  ?  ?  ?  ?  ?  ?  ?  ?  ?  ?  ?  ?  ?  ?  ?  ? ?  ?Cognition Arousal/Alertness: Awake/alert ?Behavior During Therapy: Restless, Agitated, Anxious, Impulsive ?Overall Cognitive Status: Impaired/Different from baseline ?Area of Impairment: Memory, Attention, Orientation, Following commands, Safety/judgement, Awareness, Problem solving ?  ?  ?  ?  ?  ?  ?  ?  ?  Orientation Level: Place, Time, Situation ?Current Attention Level: Sustained ?Memory: Decreased recall of precautions, Decreased short-term memory ?Following Commands: Follows one step commands inconsistently, Follows multi-step commands inconsistently ?Safety/Judgement: Decreased awareness of safety, Decreased  awareness of deficits ?Awareness: Intellectual ?Problem Solving: Decreased initiation, Difficulty sequencing, Requires verbal cues, Requires tactile cues, Slow processing ?General Comments: Following simple commands today. ?  ?  ? ?  ?Exercises   ? ?  ?General Comments General comments (skin integrity, edema, etc.): VSS on 2LO2 ?  ?  ? ?Pertinent Vitals/Pain Pain Assessment ?Pain Assessment: No/denies pain  ? ? ?Home Living   ?  ?  ?  ?  ?  ?  ?  ?  ?  ?   ?  ?Prior Function    ?  ?  ?   ? ?PT Goals (current goals can now be found in the care plan section) Progress towards PT goals: Progressing toward goals ? ?  ?Frequency ? ? ? Min 2X/week ? ? ? ?  ?PT Plan Current plan remains appropriate  ? ? ?Co-evaluation   ?  ?  ?  ?  ? ?  ?AM-PAC PT "6 Clicks" Mobility   ?Outcome Measure ? Help needed turning from your back to your side while in a flat bed without using bedrails?: None ?Help needed moving from lying on your back to sitting on the side of a flat bed without using bedrails?: A Little ?Help needed moving to and from a bed to a chair (including a wheelchair)?: A Little ?Help needed standing up from a chair using your arms (e.g., wheelchair or bedside chair)?: A Little ?Help needed to walk in hospital room?: A Little ?Help needed climbing 3-5 steps with a railing? : A Lot ?6 Click Score: 18 ? ?  ?End of Session Equipment Utilized During Treatment: Gait belt;Oxygen ?Activity Tolerance: Patient limited by fatigue ?Patient left: with call bell/phone within reach;in chair;with chair alarm set (nurse aware chair alarm was difficult to set) ?Nurse Communication: Mobility status (NT to come place purewick) ?PT Visit Diagnosis: Other abnormalities of gait and mobility (R26.89);Unsteadiness on feet (R26.81);Muscle weakness (generalized) (M62.81) ?  ? ? ?Time: 3267-1245 ?PT Time Calculation (min) (ACUTE ONLY): 19 min ? ?Charges:  $Gait Training: 8-22 mins          ?          ? ?Latonya Nelon M,PT ?Acute Rehab  Services ?[phone] ?[phone] (pager)  ? ? ?[name] ?11/10/2021, 12:07 PM ? ?

## 2021-11-10 NOTE — Assessment & Plan Note (Signed)
Patient with foul smelling urine, urine analysis with some pyuria.  ?Plan to treat with ceftriaxone.  ?

## 2021-11-10 NOTE — TOC Initial Note (Signed)
Transition of Care (TOC) - Initial/Assessment Note  ? ? ?Patient Details  ?Name: Tara Fields ?MRN: [id number] ?Date of Birth: 12/11/1927 ? ?Transition of Care [facility]) CM/SW Contact:    ?Marcheta Grammes Rexene Alberts, RN ?Phone Number: [phone] ?11/10/2021, 3:37 PM ? ?Clinical Narrative:                 ?HF TOC CM spoke to son, Louie Casa. Explained recommendation is for SNF rehab. States pt does not prefer going to SNF rehab. States he wants [facility] and will reach out to a private duty company that [employer] uses called [employer] to discuss private duty aide. Home Health RN, PT, and aide is arranged with [facility]. Cm will continue to follow up for dc needs.  ? ?Expected Discharge Plan: [place] ?Barriers to Discharge: Continued Medical Work up ? ? ?Patient Goals and CMS Choice ?Patient states their goals for this hospitalization and ongoing recovery are:: return to [facility] ?CMS Medicare.gov Compare Post Acute Care list provided to:: Patient Represenative (must comment) ?Choice offered to / list presented to : Adult Children ? ?Expected Discharge Plan and Services ?Expected Discharge Plan: [place] ?In-house Referral: NA ?Discharge Planning Services: CM Consult ?Post Acute Care Choice: Home Health ?Living arrangements for the past 2 months:  [facility]) ?                ?  ?DME Agency: NA ?  ?  ?  ?HH Arranged: Therapist, sports, Nurse's Aide ?[place] Agency: [place] [facility]) ?Date HH Agency Contacted: 11/09/21 ?Time [place]: 1751 ?Representative spoke with at [place]: Corene Cornea ? ?Prior Living Arrangements/Services ?Living arrangements for the past 2 months:  [facility]) ?Lives with:: Self ?Patient language and need for interpreter reviewed:: Yes ?Do you feel safe going back to the place where you live?: Yes      ?Need for Family Participation in Patient Care: Yes (Comment) ?Care giver support system in place?: Yes (comment) ?Current home  services:  (walker and shower chair,) ?Criminal Activity/Legal Involvement Pertinent to Current Situation/Hospitalization: No - Comment as needed ? ?Activities of Daily Living ?Home Assistive Devices/Equipment: [place] Rile (specify type) ?ADL Screening (condition at time of admission) ?Patient's cognitive ability adequate to safely complete daily activities?: Yes ?Is the patient deaf or have difficulty hearing?: Yes ?Does the patient have difficulty seeing, even when wearing glasses/contacts?: Yes ?Does the patient have difficulty concentrating, remembering, or making decisions?: No ?Patient able to express need for assistance with ADLs?: Yes ?Does the patient have difficulty dressing or bathing?: No ?Independently performs ADLs?: Yes (appropriate for developmental age) ?Does the patient have difficulty walking or climbing stairs?: No ?Weakness of Legs: None ?Weakness of Arms/Hands: None ? ?Permission Sought/Granted ?Permission sought to share information with : PCP, Family Supports, Case Manager ?Permission granted to share information with : Yes, Verbal Permission Granted ?   ?   ?   ?   ? ?Emotional Assessment ?Appearance:: Appears stated age ?  ?  ?Orientation: : Oriented to Self, Oriented to Place, Oriented to  Time ?Alcohol / Substance Use: Not Applicable ?Psych Involvement: Outpatient Provider ? ?Admission diagnosis:  Acute on chronic systolic CHF (congestive heart failure) ([place]) [I50.23] ?Acute on chronic combined systolic and diastolic CHF (congestive heart failure) ([place]) [I50.43] ?Patient Active Problem List  ? Diagnosis Date Noted  ? Malnutrition of moderate degree 11/09/2021  ? Type 2 diabetes mellitus with hyperlipidemia ([place]) 11/08/2021  ? DNR (do not  resuscitate) 11/08/2021  ? Leg cramps 09/03/2021  ? Atrial fibrillation with rapid ventricular response ([place]) 09/02/2021  ? Elevated troponin 09/02/2021  ? Lab test positive for detection of COVID-19 virus   ? Acute on chronic combined systolic and diastolic  CHF (congestive heart failure) ([place])   ? AKI (acute kidney injury) ([place])   ? Hyperlipidemia associated with type 2 diabetes mellitus ([place])   ? Overweight (BMI 25.0-29.9)   ? COVID-19 virus infection 06/07/2021  ? Generalized weakness 06/07/2021  ? GERD (gastroesophageal reflux disease) 06/07/2021  ? CHF (congestive heart failure) ([place]) 05/05/2021  ? GI bleed 04/13/2021  ? Anemia 04/13/2021  ? Symptomatic anemia 04/13/2021  ? CKD (chronic kidney disease), stage III ([place]) 04/13/2021  ? Acute on chronic systolic CHF (congestive heart failure) ([place]) 01/29/2021  ? Cognitive deficits   ? Pressure injury of skin 12/19/2020  ? Acute exacerbation of CHF (congestive heart failure) ([place]) 12/18/2020  ? COPD (chronic obstructive pulmonary disease) ([place]) 09/02/2020  ? Pulmonary nodule 1 cm or greater in diameter 01/03/2020  ? S/P TAVR (transcatheter aortic valve replacement) 05/03/2019  ? Primary osteoarthritis involving multiple joints 05/03/2019  ? Persistent atrial fibrillation ([place]) 05/03/2019  ? Bilateral carotid artery stenosis 12/01/2017  ? 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/13/2014  ? Aortic valve disorder 05/03/2014  ? Chronic obstructive bronchitis without exacerbation ([place]) 05/03/2014  ? Derangement of left knee 05/03/2014  ? Esophageal reflux 05/03/2014  ? HTN (hypertension) 05/03/2014  ? Vitamin D deficiency 05/03/2014  ? ?PCP:  [name], NP ?Pharmacy:   ?[id number] - [place], [place] - [address] ?[place] ?[place] [address] ?Phone: [phone] Fax: [phone] ? ? ? ? ?Social Determinants of Health (SDOH) Interventions ?  ? ?Readmission Risk Interventions ? ?  11/09/2021  ?  2:38 PM 09/04/2021  ?  2:20 PM 06/08/2021  ? 11:39 AM  ?Readmission Risk Prevention Plan  ?Transportation Screening Complete Complete Complete  ?PCP or Specialist Appt within 3-5 Days  Complete   ?[place] or Home Care Consult  Complete   ?Social Work Consult for [place] Planning/Counseling  Complete   ?Palliative Care Screening  Not Applicable   ?Medication Review Press photographer) Complete Complete Complete  ?PCP or Specialist appointment within 3-5 days of discharge Complete    ?[place] or Home Care Consult Complete  Complete  ?SW Recovery Care/Counseling Consult Complete  Complete  ?Palliative Care Screening Not Applicable  Not Applicable  ?[place] Not Applicable  Not Applicable  ? ? ? ?

## 2021-11-10 NOTE — Assessment & Plan Note (Signed)
Continue with nutritional supplementation.  ?

## 2021-11-10 NOTE — Progress Notes (Signed)
Speech Language Pathology Treatment: Dysphagia  ?Patient Details ?Name: Tara Fields ?MRN: [id number] ?DOB: [birth year] ?Today's Date: 11/10/2021 ?Time: 4975-3005 ?SLP Time Calculation (min) (ACUTE ONLY): 12 min ? ?Assessment / Plan / Recommendation ?Clinical Impression ? Pt is much more alert this morning and self-feeding breakfast, although with some difficulty that she attributes to her arthritis. She has no overt signs of dysphagia or aspiration this morning. Mastication seems appropriate even with bigger bites despite not having her dentures in place (although she does say she has them here at the hospital). Suspect that if she can maintain this level of alertness, she could return to baseline diet which she reports to be without modifications. Will adjust diet to regular solids and thin liquids with careful monitoring for alertness at meal times.  ?  ?HPI HPI: Pt is a 88 y.o. female who presented 11/08/21 with SOB, and had a productive cough. Pt admitted with CHF exacerbation. PMH: chronic systolic CHF EF 11-02%, h/o TAVr, PAfib on eliquis, COPD, CAD, CKD 3a, aortic stenosis s/p TAVR ?  ?   ?SLP Plan ? Continue with current plan of care ? ?  ?  ?Recommendations for follow up therapy are one component of a multi-disciplinary discharge planning process, led by the attending physician.  Recommendations may be updated based on patient status, additional functional criteria and insurance authorization. ?  ? ?Recommendations  ?Diet recommendations: Regular;Thin liquid ?Liquids provided via: Cup;Straw ?Medication Administration: Whole meds with liquid ?Supervision: Staff to assist with self feeding ?Compensations: Minimize environmental distractions;Slow rate;Small sips/bites ?Postural Changes and/or Swallow Maneuvers: Seated upright 90 degrees  ?   ?    ?   ? ? ? ? Oral Care Recommendations: Oral care BID ?Follow Up Recommendations: No SLP follow up ?Assistance recommended at discharge: Intermittent  Supervision/Assistance ?SLP Visit Diagnosis: Dysphagia, unspecified (R13.10) ?Plan: Continue with current plan of care ? ? ? ? ?  ?  ? ? ?Osie Bond., M.A. CCC-SLP ?Acute Rehabilitation Services ?Pager [phone] ?Office [phone] ? ? ?11/10/2021, 9:37 AM ?

## 2021-11-11 DIAGNOSIS — Z515 Encounter for palliative care: Secondary | ICD-10-CM

## 2021-11-11 DIAGNOSIS — R451 Restlessness and agitation: Secondary | ICD-10-CM | POA: Diagnosis not present

## 2021-11-11 DIAGNOSIS — Z7189 Other specified counseling: Secondary | ICD-10-CM

## 2021-11-11 DIAGNOSIS — I5023 Acute on chronic systolic (congestive) heart failure: Secondary | ICD-10-CM | POA: Diagnosis not present

## 2021-11-11 LAB — BASIC METABOLIC PANEL
Anion gap: 11 (ref 5–15)
BUN: 42 mg/dL — ABNORMAL HIGH (ref 8–23)
CO2: 22 mmol/L (ref 22–32)
Calcium: 8.4 mg/dL — ABNORMAL LOW (ref 8.9–10.3)
Chloride: 102 mmol/L (ref 98–111)
Creatinine, Ser: 1.29 mg/dL — ABNORMAL HIGH (ref 0.44–1.00)
GFR, Estimated: 39 mL/min — ABNORMAL LOW (ref >60)
Glucose, Bld: 156 mg/dL — ABNORMAL HIGH (ref 70–99)
Potassium: 4.2 mmol/L (ref 3.5–5.1)
Sodium: 135 mmol/L (ref 135–145)

## 2021-11-11 LAB — CBC
HCT: 32.4 % — ABNORMAL LOW (ref 36.0–46.0)
Hemoglobin: 10.5 g/dL — ABNORMAL LOW (ref 12.0–15.0)
MCH: 29.8 pg (ref 26.0–34.0)
MCHC: 32.4 g/dL (ref 30.0–36.0)
MCV: 92 fL (ref 80.0–100.0)
Platelets: 230 10*3/uL (ref 150–400)
RBC: 3.52 MIL/uL — ABNORMAL LOW (ref 3.87–5.11)
RDW: 15.7 % — ABNORMAL HIGH (ref 11.5–15.5)
WBC: 7.4 10*3/uL (ref 4.0–10.5)
nRBC: 0 % (ref 0.0–0.2)

## 2021-11-11 LAB — PROCALCITONIN: Procalcitonin: 0.2 ng/mL

## 2021-11-11 LAB — MAGNESIUM: Magnesium: 2.1 mg/dL (ref 1.7–2.4)

## 2021-11-11 MED ORDER — ACETAMINOPHEN 500 MG PO TABS
1000.0000 mg | ORAL_TABLET | Freq: Three times a day (TID) | ORAL | Status: DC
  Administered 2021-11-12 – 2021-11-15 (×9): 1000 mg via ORAL
  Filled 2021-11-11 (×9): qty 2

## 2021-11-11 MED ORDER — DIVALPROEX SODIUM 125 MG PO CSDR
125.0000 mg | DELAYED_RELEASE_CAPSULE | Freq: Two times a day (BID) | ORAL | Status: DC
  Administered 2021-11-11 – 2021-11-12 (×2): 125 mg via ORAL
  Filled 2021-11-11 (×2): qty 1

## 2021-11-11 MED ORDER — AMIODARONE HCL 200 MG PO TABS: 200.0000 mg | ORAL_TABLET | Freq: Two times a day (BID) | ORAL | Status: DC

## 2021-11-11 MED ORDER — SENNA 8.6 MG PO TABS
1.0000 | ORAL_TABLET | Freq: Every day | ORAL | Status: DC
  Administered 2021-11-11 – 2021-11-15 (×5): 8.6 mg via ORAL
  Filled 2021-11-11 (×5): qty 1

## 2021-11-11 MED ORDER — HALOPERIDOL LACTATE 5 MG/ML IJ SOLN
2.5000 mg | Freq: Once | INTRAMUSCULAR | Status: AC
  Administered 2021-11-12: 2.5 mg via INTRAVENOUS
  Filled 2021-11-11: qty 1

## 2021-11-11 MED ORDER — MORPHINE SULFATE (PF) 2 MG/ML IV SOLN
0.2500 mg | INTRAVENOUS | Status: AC
  Administered 2021-11-11: 0.25 mg via INTRAVENOUS
  Filled 2021-11-11: qty 1

## 2021-11-11 MED ORDER — SODIUM CHLORIDE 0.9 % IV SOLN
510.0000 mg | Freq: Once | INTRAVENOUS | Status: AC
  Administered 2021-11-11: 510 mg via INTRAVENOUS
  Filled 2021-11-11: qty 17

## 2021-11-11 MED ORDER — HALOPERIDOL LACTATE 5 MG/ML IJ SOLN
2.0000 mg | Freq: Four times a day (QID) | INTRAMUSCULAR | Status: AC | PRN
  Administered 2021-11-11: 2 mg via INTRAVENOUS
  Filled 2021-11-11: qty 1

## 2021-11-11 NOTE — Progress Notes (Signed)
SLP Cancellation Note ? ?Patient Details ?Name: Tara Fields ?MRN: [id number] ?DOB: 1927-09-04 ? ? ?Cancelled treatment:       Reason Eval/Treat Not Completed: Patient declined, no reason specified;Other (comment) (Patient has been agitated and active per RN, getting in and out of bed frequently. He did report she has done well with PO's when she is agreeable to eat/drink. SLP will attempt to f/u next date.) ? ? ?Sonia Baller, MA, CCC-SLP ?Speech Therapy ? ?

## 2021-11-11 NOTE — Progress Notes (Signed)
Patient noted to be agitated at change of shift. Lung sounds with rhonchi and wheezing with increased work of breathing. Morphine and albuterol provided. Work of breathing improved and wheezing eliminated, but agitation remains. Contacted patient's son Tara Fields). Tara Fields came to bedside to sit with his mother to redirect and reassure. This was successful until 0330, whereupon the patient was no longer able to be redirected by Tara Fields. Notified physician; obtained order for another dose of morphine. ? ?Per Tara Fields, this is the fifth night he has had to remain at beside to redirect due to agitation. ?

## 2021-11-11 NOTE — Progress Notes (Signed)
Mobility Specialist Progress Note: ? ? 11/11/21 1500  ?Mobility  ?Activity Ambulated with assistance in hallway  ?Level of Assistance Contact guard assist, steadying assist  ?Assistive Device Front wheel walker ?((youth))  ?Distance Ambulated (ft) 50 ft  ?Activity Response Tolerated well  ?$Mobility charge 1 Mobility  ? ?Pt received halfway out of recliner, yelling out for help. Pt agreeable to mobility session, not oriented to place or situation. Session limited d/t cognitive deficits. Back in room with OT for session.  ? ?Nelta Numbers ?Acute Rehab ?Phone: [phone] ?Office Phone: [phone] ? ?

## 2021-11-11 NOTE — Progress Notes (Signed)
Patient has become more confused and is agitated toward staff, very uncooperative and hard to redirect. Patient is a very high fall risk and is continuously getting out of the bed and chair to go "out on the porch". When trying to reorient/redirect patient, she becomes increasingly agitated, at times trying to hit staff, and will not follow safety commands. Patient refusing to wear oxygen and continuously pulling off heart monitor. At this time, staff having to sit at bedside with patient for patient safety. Charge RN notified that Air cabin crew needed.  ?

## 2021-11-11 NOTE — Progress Notes (Addendum)
? ? Advanced Heart Failure Rounding Note ? ?PCP-Cardiologist: Lauree Chandler, MD  ? ?Subjective:   ?Remains on amio drip for A fib RVR. Rate controlled with on higher dose to Toprol XL.  ? ? ?Denies SOB.  ? ?Objective:   ?Weight Range: ?64.1 kg ?Body mass index is 27.6 kg/m?.  ? ?Vital Signs:   ?Temp:  [97.6 ?F (36.4 ?C)-98.1 ?F (36.7 ?C)] 97.6 ?F (36.4 ?C) (03/29 0416) ?Pulse Rate:  [85-95] 85 (03/29 0416) ?Resp:  [12-22] 22 (03/29 0416) ?BP: (110-123)/(70-75) 117/70 (03/29 0416) ?SpO2:  [91 %-98 %] 91 % (03/29 0416) ?Weight:  [64.1 kg] 64.1 kg (03/29 0100) ?Last BM Date : 11/08/21 ? ?Weight change: ?Filed Weights  ? 11/09/21 0542 11/10/21 0402 11/11/21 0100  ?Weight: 65.2 kg 66.1 kg 64.1 kg  ? ? ?Intake/Output:  ? ?Intake/Output Summary (Last 24 hours) at 11/11/2021 0756 ?Last data filed at 11/11/2021 8101 ?Gross per 24 hour  ?Intake 1076.74 ml  ?Output 100 ml  ?Net 976.74 ml  ?  ? ? ?Physical Exam  ? General:  Sitting on the side of the bed. No resp difficulty ?HEENT: normal ?Neck: supple. no JVD. Carotids 2+ bilat; no bruits. No lymphadenopathy or thryomegaly appreciated. ?Cor: PMI nondisplaced. Irregular rate & rhythm. No rubs, gallops. 2/6 RUSB. ?Lungs: Rhonchi throughout on 2 liters.  ?Abdomen: soft, nontender, nondistended. No hepatosplenomegaly. No bruits or masses. Good bowel sounds. ?Extremities: no cyanosis, clubbing, rash, edema ?Neuro: alert & orientedx3, cranial nerves grossly intact. moves all 4 extremities w/o difficulty. Affect pleasant ? ? ?Telemetry  ? ?A fib 70-90s. Personally checked  ? ? ?Labs  ?  ?CBC ?Recent Labs  ?  11/09/21 ?0351 11/10/21 ?0653  ?WBC 7.6 7.0  ?HGB 10.9* 10.5*  ?HCT 33.1* 33.3*  ?MCV 93.0 94.1  ?PLT 203 207  ? ?Basic Metabolic Panel ?Recent Labs  ?  11/10/21 ?0653 11/11/21 ?0409  ?NA 137 135  ?K 3.6 4.2  ?CL 100 102  ?CO2 28 22  ?GLUCOSE 162* 156*  ?BUN 35* 42*  ?CREATININE 1.07* 1.29*  ?CALCIUM 8.9 8.4*  ?MG 2.1 2.1  ? ?Liver Function Tests ?No results for input(s):  AST, ALT, ALKPHOS, BILITOT, PROT, ALBUMIN in the last 72 hours. ? ?No results for input(s): LIPASE, AMYLASE in the last 72 hours. ?Cardiac Enzymes ?No results for input(s): CKTOTAL, CKMB, CKMBINDEX, TROPONINI in the last 72 hours. ? ?BNP: ?BNP (last 3 results) ?Recent Labs  ?  06/07/21 ?0955 09/02/21 ?7510 11/08/21 ?2585  ?BNP 3,284.2* 1,377.5* 1,638.3*  ? ? ?ProBNP (last 3 results) ?No results for input(s): PROBNP in the last 8760 hours. ? ? ?D-Dimer ?No results for input(s): DDIMER in the last 72 hours. ?Hemoglobin A1C ?No results for input(s): HGBA1C in the last 72 hours. ?Fasting Lipid Panel ?No results for input(s): CHOL, HDL, LDLCALC, TRIG, CHOLHDL, LDLDIRECT in the last 72 hours. ?Thyroid Function Tests ?No results for input(s): TSH, T4TOTAL, T3FREE, THYROIDAB in the last 72 hours. ? ?Invalid input(s): FREET3 ? ?Other results: ? ? ?Imaging  ? ? ?DG Chest 1 View ? ?Result Date: 11/10/2021 ?CLINICAL DATA:  Dyspnea EXAM: CHEST  1 VIEW COMPARISON:  11/09/2021 FINDINGS: Again noted is cardiomegaly and sequela of prior TAVR. Persistent prominent interstitial lung markings, with further increase in densities in the left upper lung and suprahilar region. No definite pleural effusion on this single AP view. No pneumothorax. No acute osseous abnormality. IMPRESSION: Further increase in densities in the left upper lung and suprahilar region, which are nonspecific, with possible  etiologies including atelectasis and infection. Electronically Signed   By: Merilyn Baba M.D.   On: 11/10/2021 16:10   ? ? ?Medications:   ? ? ?Scheduled Medications: ? apixaban  5 mg Oral BID  ? atorvastatin  40 mg Oral Daily  ? docusate sodium  100 mg Oral BID  ? feeding supplement  237 mL Oral BID BM  ? mouth rinse  15 mL Mouth Rinse BID  ? metoprolol succinate  50 mg Oral BID  ? multivitamin with minerals  1 tablet Oral Daily  ? potassium chloride  20 mEq Oral Daily  ? sodium chloride flush  3 mL Intravenous Q12H  ? spironolactone  12.5 mg  Oral Daily  ? terazosin  10 mg Oral QHS  ? torsemide  20 mg Oral QPM  ? torsemide  40 mg Oral Q breakfast  ? traMADol  50 mg Oral TID  ? ? ?Infusions: ? amiodarone 30 mg/hr (11/10/21 2226)  ? cefTRIAXone (ROCEPHIN)  IV 2 g (11/10/21 1203)  ? ? ?PRN Medications: ?acetaminophen **OR** acetaminophen, albuterol, bisacodyl, morphine injection, polyethylene glycol, prochlorperazine ? ? ? ?Patient Profile  ?88 year old with a h/o HFrEF, chronic a fib, CAD, CKD Stage IIIa, S/P TAVR 2020 , HTN, and HLD. Admitted with A/C HFrEF.  ? ?Assessment/Plan  ? ?1. A/C HFrEF  ?-Echo EF 25-30% back in May 2022. Ischemic work up was not pursued per patient request. . Last cath back in 2019 at [facility] with 25% mid LAD stenosis, 50% stenosis second diagonal branch, 50% distal Circumflex stenosis, 75% ostial stenosis of a small Diagonal branch. ?- Echo EF 20-25%, severely dilated LV, RV okay, severe BAE, mild MR, 26 mm S3 in aortic position with mild AI, IVC dilated with RAP 15 mmHg. ?-BNP on admit 1638. Reds Clip 33% not suggestive of fluid accumulation. She was diuresed with IV lasix which has been stopped. Volume status stable.  ?- Continue po Torsemide 40 mg q am and 20 mg q pm.  ?- Continue Toprol  to 50 mg twice daily.   ?- Continue spiro 12.5 mg daily ?- Avoid SGLT2i with current UTI ?- Renal function stable.  ?  ?2. A Fib RVR , know chronic a fib ?- Uncontrolled today with rate up 150s. Started on amio drip for rate control. ?- Rate controlled with amio gtt. Switch back to po amio 200 mg twice a day.  ?- Continue eliquis 5 mg twice a day  ? ?3. Suspect PNA ?- CXR with new densities in left upper lung and left suprahilar region, ? Aspiration.  ?- Speech evaluated, mild aspiration risk. ?- On antibiotics per primary team. ? ?4. CKD Stage IIIa ?- Creatinine baseline 1-1.2 ?- Stable trending up 1.1>1.3  ?  ?5. H/O CAD ?-Cath 10/2017 at [place] with 25% mid LAD stenosis, 50% stenosis second diagonal branch, 50% distal Circumflex stenosis, 75%  ostial stenosis of a small diagonal branch. ?- HS Trop 9>12 . No chest pain.  ?  ?6. S/P TAVR 2020 with 26 mm S3 at [facility] ?- Mild AI on echo ?- She is on eliquis so stopped aspirin.  ?  ?7. Cognitive Deficits ?-Intermittent confusion, alert this am ?-? How much UTI contributing.  ? ?8. UTI ?- Now on abx per primary ? ?9. Anemia ?- Hgb 10.9 ?- Iron sats 6%.  ?- Give feraheme ? ?10. DNR  ?-May need Hospice at D/C  ?-Palliative care consulted for [place] discussion ? ? ?PT recommended SNF for rehab ? ? ?  Length of Stay: 3 ? ?Darrick Grinder, NP  ?11/11/2021, 7:56 AM ? ?Advanced Heart Failure Team ?Pager [phone] (M-F; 7a - 5p)  ?Please contact [facility] for night-coverage after hours (5p -7a ) and weekends on [email or web address] ?  ?Patient seen with NP, agree with the above note.   ? ?HR 80s off amiodarone gtt this morning.  Creatinine trending up, 1.29 today with BUN 42.  Feels like breathing is better.  Low transferrin saturation.  ? ?General: NAD ?Neck: No JVD, no thyromegaly or thyroid nodule.  ?Lungs: Rhonchi bilaterally.  ?CV: Nondisplaced PMI.  Heart irregular S1/S2, no S3/S4, no murmur.  No peripheral edema.   ?Abdomen: Soft, nontender, no hepatosplenomegaly, no distention.  ?Skin: Intact without lesions or rashes.  ?Neurologic: Alert and oriented x 3.  ?Psych: Normal affect. ?Extremities: No clubbing or cyanosis.  ?HEENT: Normal.  ? ?Main issue here may be PNA rather than CHF.  She is on ceftriaxone and gradually improving.  She does not appear volume overloaded and creatinine is trending up.  ?- Decrease torsemide back to prior home dose, 40 mg daily.  ?- Continue spironolactone 12.5 daily and current Toprol XL.  ?- No ARNI/ARB with rising creatinine.  ?- No SGLT2 inhibitor with UTI.  ? ?She has permanent atrial fibrillation.  Rate is controlled this morning.  ?- Continue Toprol XL 50 mg bid.  ?- Continue apixaban.  ?- I am going to stop amiodarone.  ? ?Will give feraheme with Fe deficiency.  ? ?Loralie Champagne ?11/11/2021 ?8:25  AM ? ?

## 2021-11-11 NOTE — Consult Note (Signed)
? ?                                                                                ?Consultation Note ?Date: 11/11/2021  ? ?Patient Name: Tara Fields  ?DOB: 27-Oct-1927  MRN: [id number]  Age / Sex: 88 y.o., female  ?PCP: [name], NP ?Referring Physician: Domenic Polite, MD ? ?Reason for Consultation: Establishing goals of care ? ?HPI/Patient Profile: 88 y.o. female  with past medical history of CAD, s/p TAVR, COPD, HTN, HLD, afib on Eliquis, chronic systolic CHF EF 18-84%, CKD stage 3a, diabetes, cognitive impairment admitted on 11/08/2021 from independent living with shortness of breath with productive cough related to heart failure exacerbation. Has diuresed well. Hospitalization complicated by pneumonia, UTI, worsening afib, and increased agitation/confusion.  ? ?Clinical Assessment and Goals of Care: ?I met today at Tara Fields's bedside but no family present. She is sitting in recliner - somewhat restless and unable to get comfortable in chair. Just worked with therapy and walked but fluctuating between moments of lethargy and anxiety. She is able to tell me that she has been told she drinks too many fluids (she disagrees) but is unable to tell me why monitoring fluid intake is important. Between her anxiety and her lethargy we are unable to have a conversation. She has poor understanding of her condition but just knows she is very sick and does not feel well.  ? ?I revisited in the afternoon and Tara Fields is much more agitated and confused. She is extremely difficult to redirect and trying to leave the room to go sit on "her porch." RN is having to sit in her room at her bedside to keep her safe. I called and spoke with her son, Louie Casa. Louie Casa shares that his mother has been like this in previous hospital admissions but has always slowly improved agitation along with improvement in her condition. She has been living at [facility] and able to get around her room (she is not very social so she does  not leave her room often anyway) and even able to clean her own room and has good appetite. He shares that she is usually fairly sharp and no significant noted forgetfulness or memory issues at baseline. He notes that she has severe arthritic pain and has had various injections over the years. He also reports that she has had worse agitation with previous hospitalization. He agrees with medications to try and provide her with some relief and sleep. We discussed delirium and that there are times that this does not completely resolve depending on the situation and there can be lasting effects and changes in baseline. He understands that only time will tell where we end up. We agreed to continue to support his mother and touch base daily to discuss progress and options based on how she progresses.  ? ?All questions/concerns addressed. Emotional support.  ? ?Primary Decision Maker ?NEXT OF KIN ?  ? ?SUMMARY OF RECOMMENDATIONS   ?- DNR in place ?- Time for outcomes ? ?Code Status/Advance Care Planning: ?DNR ? ? ?Symptom Management:  ?Agitation: Depakote sprinkles 125 mg every 12 hours. May consider Zyprexa 2.5 mg BID if agitation continues.  ?Abd  pain - constipation? Senokot daily.  ? ?Palliative Prophylaxis:  ?Bowel Regimen, Delirium Protocol, Frequent Pain Assessment, Oral Care, and Turn Reposition ? ? ?Prognosis:  ?Overall prognosis guarded.  ? ?Discharge Planning: To Be Determined  ? ?  ? ?Primary Diagnoses: ?Present on Admission: ? Acute on chronic systolic CHF (congestive heart failure) (HCC) ? (Resolved) Pure hypercholesterolemia ? Persistent atrial fibrillation ([place]) ? HTN (hypertension) ? Cognitive deficits ? CKD (chronic kidney disease), stage III ([place]) ? Type 2 diabetes mellitus with hyperlipidemia ([place]) ? DNR (do not resuscitate) ? Malnutrition of moderate degree ? UTI (urinary tract infection) ? ? ?I have reviewed the medical record, interviewed the patient and family, and examined the patient. The following  aspects are pertinent. ? ?Past Medical History:  ?Diagnosis Date  ? Aortic stenosis   ? s/p TAVR 26 mm Edwards Sapien 3 THV July 2020  ? CAD (coronary artery disease)   ? COPD (chronic obstructive pulmonary disease) ([place])   ? Diabetes mellitus ([place])   ? Diverticulitis   ? GERD (gastroesophageal reflux disease)   ? HTN (hypertension)   ? Hyperlipidemia   ? Legally blind in left eye, as defined in [place]   ? Tobacco abuse   ? ?Social History  ? ?Socioeconomic History  ? Marital status: Widowed  ?  Spouse name: Not on file  ? Number of children: 2  ? Years of education: Not on file  ? Highest education level: High school graduate  ?Occupational History  ? Occupation: Retired  ?  Comment: homemaker  ?Tobacco Use  ? Smoking status: Former  ?  Packs/day: 1.00  ?  Years: 70.00  ?  Pack years: 70.00  ?  Types: Cigarettes  ?  Quit date: 12/15/2019  ?  Years since quitting: 1.9  ? Smokeless tobacco: Never  ? Tobacco comments:  ?  Pt states quiting a year ago. 01/03/20 ARJ  ?Vaping Use  ? Vaping Use: Never used  ?Substance and Sexual Activity  ? Alcohol use: No  ?  Alcohol/week: 0.0 standard drinks  ? Drug use: No  ? Sexual activity: Not Currently  ?Other Topics Concern  ? Not on file  ?Social History Narrative  ? Widowed- was married for 40 years   ? Has two sons   ?   ? ?Social Determinants of Health  ? ?Financial Resource Strain: Low Risk   ? Difficulty of Paying Living Expenses: Not hard at all  ?Food Insecurity: No Food Insecurity  ? Worried About Charity fundraiser in the Last Year: Never true  ? Ran Out of Food in the Last Year: Never true  ?Transportation Needs: No Transportation Needs  ? Lack of Transportation (Medical): No  ? Lack of Transportation (Non-Medical): No  ?Physical Activity: Insufficiently Active  ? Days of Exercise per Week: 5 days  ? Minutes of Exercise per Session: 10 min  ?Stress: No Stress Concern Present  ? Feeling of Stress : Not at all  ?Social Connections: Socially Isolated  ? Frequency of  Communication with Friends and Family: Three times a week  ? Frequency of Social Gatherings with Friends and Family: Three times a week  ? Attends Religious Services: Never  ? Active Member of Clubs or Organizations: No  ? Attends Archivist Meetings: Never  ? Marital Status: Widowed  ? ?Family History  ?Problem Relation Age of Onset  ? Diabetes Mother   ? Stroke Brother   ? Cerebral palsy Brother   ? Coronary artery disease  Neg Hx   ? ?Scheduled Meds: ? apixaban  5 mg Oral BID  ? atorvastatin  40 mg Oral Daily  ? docusate sodium  100 mg Oral BID  ? feeding supplement  237 mL Oral BID BM  ? mouth rinse  15 mL Mouth Rinse BID  ? metoprolol succinate  50 mg Oral BID  ? multivitamin with minerals  1 tablet Oral Daily  ? potassium chloride  20 mEq Oral Daily  ? sodium chloride flush  3 mL Intravenous Q12H  ? spironolactone  12.5 mg Oral Daily  ? terazosin  10 mg Oral QHS  ? torsemide  40 mg Oral Q breakfast  ? traMADol  50 mg Oral TID  ? ?Continuous Infusions: ? cefTRIAXone (ROCEPHIN)  IV 2 g (11/10/21 1203)  ? ferumoxytol 510 mg (11/11/21 1004)  ? ?PRN Meds:.acetaminophen **OR** acetaminophen, albuterol, bisacodyl, morphine injection, polyethylene glycol, prochlorperazine ?Allergies  ?Allergen Reactions  ? Amlodipine Besy-Benazepril Hcl Cough  ? Hctz [Hydrochlorothiazide] Other (See Comments)  ?  Per family, every diuretic the patient has been on flushes out her out and ultimately results in CRAMPING/JAUNDICE (added potassium might help)  ? Other Other (See Comments)  ?  History of diverticulitis- CANNOT TOLERATE NUTS, CORN, AND ANY FOODS NOT EASILY DIGESTED- the patient AVOIDS these  ? Cyclobenzaprine Other (See Comments)  ?  Confusion- does not tolerate well   ? Diclofenac Sodium Diarrhea  ? Hydralazine Other (See Comments)  ?  Per family, every diuretic the patient has been on flushes out her out and ultimately results in CRAMPING/JAUNDICE (added potassium might help)  ? Methylprednisolone Other (See  Comments)  ?  Medrol/CORTICOSTEROIDS = A LOT OF ENERGY  ? Oxycodone Other (See Comments)  ?  Patient becomes very disorientated   ? ?Review of Systems  ?Unable to perform ROS: Acuity of condition  ?Respiratory:  Pos

## 2021-11-11 NOTE — Progress Notes (Addendum)
Physical Therapy Treatment ?Patient Details ?Name: Tara Fields ?MRN: [id number] ?DOB: 11-16-1927 ?Today's Date: 11/11/2021 ? ? ?History of Present Illness Pt is a 88 y.o. female who presented 11/08/21 with SOB, and had a productive cough. Pt admitted with CHF exacerbation. PMH: chronic systolic CHF EF 69-67%, h/o TAVr, PAfib on eliquis, COPD, CAD, CKD 3a, aortic stenosis s/p TAVR ? ?  ?PT Comments  ? ? Pt received in recliner, agreeable to therapy session and with good participation and tolerance for gait and transfer training. Pt needing heavy safety cues for UE placement for transfers and RW use/posture throughout, needing up to minA physical assist. Pt SpO2 desat on 2L O2 [place] (noisy signal) and with dyspnea on exertion, pt SpO2 titrated to 6L with exertion. Pt possibly desaturating intermittently when pt resting on 2L [place] when she falls asleep in chair (RN notified, pt needs cues to open eyes and for pursed-lip breathing and SpO2 improves >92%). Pt with possible delirium after session as therapist called back to room when pt calling out and asking if a cat was outside her room, no cat seen on roof outside, pt reoriented to situation/location. Due to high fall risk and continued pt confusion throughout day/evening, continue to recommend SNF unless strict 24/7 supervision/assist available, in which case pt would be safe to DC home with max [facility] services and assist due to increased O2 needs and pt confusion. DME recommendation updated below as per chart review pt uses cane or rollator but with current cognitive deficit, youth RW would be safer as pt unable to state when needing to use brakes for rollator.  ?Recommendations for follow up therapy are one component of a multi-disciplinary discharge planning process, led by the attending physician.  Recommendations may be updated based on patient status, additional functional criteria and insurance authorization. ? ?Follow Up Recommendations ? Skilled nursing-short term  rehab (<3 hours/day) (HHPT if 24 hour care available) ?  ?  ?Assistance Recommended at Discharge Frequent or constant Supervision/Assistance (24/7 supervision)  ?Patient can return home with the following A little help with walking and/or transfers;A little help with bathing/dressing/bathroom;Direct supervision/assist for medications management;Direct supervision/assist for financial management;Assist for transportation;Help with stairs or ramp for entrance ?  ?Equipment Recommendations ? Rolling walker (2 wheels) (youth height RW (pt 54ft 0" tall))  ?  ?Recommendations for Other Services   ? ? ?  ?Precautions / Restrictions Precautions ?Precautions: Fall ?Precaution Comments: Watch O2 ?Restrictions ?Weight Bearing Restrictions: No  ?  ? ?Mobility ? Bed Mobility ?  ?  ?  ?  ?  ?  ?  ?General bed mobility comments: received/remained up in recliner chair ?  ? ?Transfers ?Overall transfer level: Needs assistance ?Equipment used: Rolling walker (2 wheels) ?Transfers: Sit to/from Stand, Bed to chair/wheelchair/BSC ?Sit to Stand: Min assist, Min guard ?  ?  ?  ?  ?  ?General transfer comment: cues for safe hand placement needed each transfer, pt standing from chair and elevated BSC with cues and min guard to minA for stability upon rising. ?  ? ?Ambulation/Gait ?Ambulation/Gait assistance: Min guard, Min assist ?Gait Distance (Feet): 60 Feet (37ft to toilet, then 28ft in hallway) ?Assistive device: Rolling walker (2 wheels) ?Gait Pattern/deviations: Step-through pattern, Decreased stride length, Drifts right/left, Trunk flexed, Leaning posteriorly ?  ?Gait velocity interpretation: <1.31 ft/sec, indicative of household ambulator ?  ?General Gait Details: Pt needed min assist throughout due to needing cues for safety with use of RW and for posture/pursed-lip breathing. Needed assist for proximity  to RW and steering RW as well as for O2 tank management. ? ?  ?Balance Overall balance assessment: Mild deficits observed, not  formally tested ?  ?  ?  ?  ? ?  ?Cognition Arousal/Alertness: Lethargic ?Behavior During Therapy: Restless, Anxious, Impulsive ?Overall Cognitive Status: Impaired/Different from baseline ?Area of Impairment: Memory, Attention, Orientation, Following commands, Safety/judgement, Awareness, Problem solving ?  ?  ?Orientation Level: Place, Time, Situation ?Current Attention Level: Sustained ?Memory: Decreased recall of precautions, Decreased short-term memory ?Following Commands: Follows one step commands inconsistently, Follows multi-step commands inconsistently ?Safety/Judgement: Decreased awareness of safety, Decreased awareness of deficits ?Awareness: Intellectual ?Problem Solving: Decreased initiation, Difficulty sequencing, Requires verbal cues, Requires tactile cues, Slow processing ?General Comments: Following simple commands today, pt anxious/drowsy and at one point asks therapist "Is that a cat outside?" pointing to HVAC vent on the roof outside. Pt intermittently falling asleep in chair and possibly desatting when this happens, with cues for PLB SpO2 improves. Pt more alert during ambulation to bathroom and in hallway, once sitting in chair pt falling asleep. ?  ?  ? ?  ?Exercises   ? ?  ?General Comments General comments (skin integrity, edema, etc.): pt needing 6L O2 [place] with exertion due to SpO2 desat to 82-86% on 2-4L O2 [place] and dyspnea on exertion; HR elevated up to 118 bpm with exertion. ?  ?  ? ?Pertinent Vitals/Pain Pain Assessment ?Pain Assessment: Faces ?Faces Pain Scale: Hurts even more ?Pain Location: abdominal discomfort (possible bloating?) ?Pain Descriptors / Indicators: Pressure ?Pain Intervention(s): Monitored during session, Repositioned  ? ? ? ?PT Goals (current goals can now be found in the care plan section) Acute Rehab PT Goals ?Patient Stated Goal: less abdominal discomfort, to keep moving ?PT Goal Formulation: With patient ?Time For Goal Achievement: 11/23/21 ?Progress towards PT goals:  Progressing toward goals ? ?  ?Frequency ? ? ? Min 2X/week ? ? ? ?  ?PT Plan Current plan remains appropriate  ? ? ?Co-evaluation   ?  ?  ?  ?  ? ?  ?AM-PAC PT "6 Clicks" Mobility   ?Outcome Measure ? Help needed turning from your back to your side while in a flat bed without using bedrails?: A Little ?Help needed moving from lying on your back to sitting on the side of a flat bed without using bedrails?: A Little ?Help needed moving to and from a bed to a chair (including a wheelchair)?: A Little ?Help needed standing up from a chair using your arms (e.g., wheelchair or bedside chair)?: A Lot (mod safety cues) ?Help needed to walk in hospital room?: A Lot (mod safety cues) ?Help needed climbing 3-5 steps with a railing? : A Lot ?6 Click Score: 15 ? ?  ?End of Session Equipment Utilized During Treatment: Gait belt;Oxygen ?Activity Tolerance: Patient tolerated treatment well;Patient limited by fatigue ?Patient left: with call bell/phone within reach;in chair;with chair alarm set (RN notified pt intermittently impulsive and possible desat when she falls asleep in chair, abdominal discomfort) ?Nurse Communication: Mobility status (NT to come place purewick) ?PT Visit Diagnosis: Other abnormalities of gait and mobility (R26.89);Unsteadiness on feet (R26.81);Muscle weakness (generalized) (M62.81) ?  ? ? ?Time: 7106-2694 ?PT Time Calculation (min) (ACUTE ONLY): 38 min ? ?Charges:  $Gait Training: 23-37 mins ?$Therapeutic Activity: 8-22 mins          ?          ? ?Tara Fields P., PTA ?Acute Rehabilitation Services ?Pager: [phone] ?Office: [phone]  ? ? ?Tara  Ruthetta Fields ?11/11/2021, 12:09 PM ? ?

## 2021-11-11 NOTE — Progress Notes (Signed)
?PROGRESS NOTE ? ? ? ?Tara Fields  HKV:[id number] DOB: 02/12/1928 DOA: 11/08/2021 ?PCP: [name], NP  ?Narrative [name] with history of CAD, chronic systolic CHF, s/p TAVR, COPD, type 2 diabetes mellitus, hypertension, paroxysmal atrial fibrillation presented to the ED with dyspnea, had recent URI and productive cough as well. ?-In the ED BNP was 1638, chest x-ray noted cardiomegaly and pulmonary vascular congestion,, repeat chest x-ray with additional opacities, concerning for pneumonia/atelectasis ? ? ?Subjective: ?-Feels uncomfortable, has a band across her chest, some cough reported ? ?Assessment & Plan: ? ? ?Acute hypoxic respiratory failure  ?Community-acquired pneumonia  ?-Continue IV ceftriaxone day 3 ?-Wean O2 ?-Add incentive spirometry ?-SLP evaluation ruled out aspiration ?-Ambulate, PT eval ? ?Acute on chronic systolic CHF (congestive heart failure) ([place]) ?Her last echocardiogram from 2022 had a reduced LV systolic function 25 to 56% with global hypokinesis. Preserved RV systolic function ?-Diuresed with IV Lasix, BNP was 1638 on admission ?-Now transition to oral torsemide, continue Toprol, Aldactone ? ?Persistent atrial fibrillation ([place]) ?-With RVR, started on amiodarone, now p.o. ?-Continue Eliquis ? ?CKD (chronic kidney disease), stage III ([place]) ?CKD stage 3a, hypokalemia. ?-Stable, monitor ? ?Iron deficiency anemia ?-Hemoglobin relatively stable, add IV iron ? ?HTN (hypertension) ?-Continue metoprolol, Aldactone ? ?Type 2 diabetes mellitus with hyperlipidemia ([place]) ?-CBGs are stable, continue sliding scale insulin ? ?Malnutrition of moderate degree ?Continue with nutritional supplementation.  ? ?Abnormal UA ?-Patient denies any symptoms of UTI, continue ABX for pneumonia ? ?DVT prophylaxis: Apixaban ?Code Status: DNR ?Family Communication: Discussed with patient in detail, no family at bedside ?Disposition Plan: ALF in 1 to 2 days ? ?Consultants:  ?Cardiology ? ?Procedures:   ? ?Antimicrobials:  ? ? ?Objective: ?Vitals:  ? 11/10/21 1926 11/11/21 0100 11/11/21 0416 11/11/21 1121  ?BP: 123/75  117/70 102/77  ?Pulse: 95  85 79  ?Resp: 12  (!) 22 17  ?Temp: 97.9 ?F (36.6 ?C)  97.6 ?F (36.4 ?C)   ?TempSrc: Oral  Oral Oral  ?SpO2: 95%  91% 97%  ?Weight:  64.1 kg    ?Height:      ? ? ?Intake/Output Summary (Last 24 hours) at 11/11/2021 1425 ?Last data filed at 11/11/2021 1257 ?Gross per 24 hour  ?Intake 1076.74 ml  ?Output 300 ml  ?Net 776.74 ml  ? ?Filed Weights  ? 11/09/21 0542 11/10/21 0402 11/11/21 0100  ?Weight: 65.2 kg 66.1 kg 64.1 kg  ? ? ?Examination: ? ?General exam: Pleasant elderly female sitting up in bed, AAOx3, no distress ?HEENT: No JVD ?CVS: S1-S2, irregularly irregular, systolic murmur ?Lungs: Rhonchi at the bases, especially on the left ?Abdomen: Soft, nontender, bowel sounds present ?Extremities: No edema ?Skin: No rashes ?Psychiatry: Judgement and insight appear normal. Mood & affect appropriate.  ? ? ? ?Data Reviewed:  ? ?CBC: ?Recent Labs  ?Lab 11/08/21 ?4332 11/09/21 ?9518 11/10/21 ?8416  ?WBC 9.9 7.6 7.0  ?HGB 10.9* 10.9* 10.5*  ?HCT 35.0* 33.1* 33.3*  ?MCV 93.1 93.0 94.1  ?PLT 224 203 207  ? ?Basic Metabolic Panel: ?Recent Labs  ?Lab 11/08/21 ?6063 11/09/21 ?0160 11/10/21 ?1093 11/11/21 ?0409  ?NA 139 138 137 135  ?K 4.1 3.5 3.6 4.2  ?CL 103 104 100 102  ?CO2 22 22 28 22   ?GLUCOSE 182* 166* 162* 156*  ?BUN 38* 35* 35* 42*  ?CREATININE 1.17* 1.08* 1.07* 1.29*  ?CALCIUM 9.3 8.8* 8.9 8.4*  ?MG 2.2  --  2.1 2.1  ? ?GFR: ?Estimated Creatinine Clearance: 23.7 mL/min (A) (by C-G formula based on SCr  of 1.29 mg/dL (H)). ?Liver Function Tests: ?Recent Labs  ?Lab 11/08/21 ?8144  ?AST 21  ?ALT 12  ?ALKPHOS 61  ?BILITOT 0.8  ?PROT 6.9  ?ALBUMIN 3.9  ? ?No results for input(s): LIPASE, AMYLASE in the last 168 hours. ?No results for input(s): AMMONIA in the last 168 hours. ?Coagulation Profile: ?No results for input(s): INR, PROTIME in the last 168 hours. ?Cardiac Enzymes: ?No results  for input(s): CKTOTAL, CKMB, CKMBINDEX, TROPONINI in the last 168 hours. ?BNP (last 3 results) ?No results for input(s): PROBNP in the last 8760 hours. ?HbA1C: ?No results for input(s): HGBA1C in the last 72 hours. ?CBG: ?Recent Labs  ?Lab 11/08/21 ?1556  ?GLUCAP 120*  ? ?Lipid Profile: ?No results for input(s): CHOL, HDL, LDLCALC, TRIG, CHOLHDL, LDLDIRECT in the last 72 hours. ?Thyroid Function Tests: ?No results for input(s): TSH, T4TOTAL, FREET4, T3FREE, THYROIDAB in the last 72 hours. ?Anemia Panel: ?Recent Labs  ?  11/10/21 ?0653  ?YJEHUDJS97 1,589*  ?FOLATE 35.2  ?FERRITIN 145  ?TIBC 346  ?IRON 22*  ?RETICCTPCT 2.0  ? ?Urine analysis: ?   ?Component Value Date/Time  ? [place] YELLOW 11/09/2021 1535  ? APPEARANCEUR HAZY (A) 11/09/2021 1535  ? LABSPEC 1.012 11/09/2021 1535  ? PHURINE 6.0 11/09/2021 1535  ? [place] NEGATIVE 11/09/2021 1535  ? GLUCOSEU NEGATIVE 12/11/2019 1033  ? HGBUR SMALL (A) 11/09/2021 1535  ? [place] NEGATIVE 11/09/2021 1535  ? [place] NEGATIVE 11/09/2021 1535  ? [place] NEGATIVE 11/09/2021 1535  ? UROBILINOGEN 0.2 12/11/2019 1033  ? NITRITE NEGATIVE 11/09/2021 1535  ? LEUKOCYTESUR LARGE (A) 11/09/2021 1535  ? ?Sepsis Labs: ?@LABRCNTIP (procalcitonin:4,lacticidven:4) ? ?)No results found for this or any previous visit (from the past 240 hour(s)).  ? ?Radiology Studies: ?DG Chest 1 View ? ?Result Date: 11/10/2021 ?CLINICAL DATA:  Dyspnea EXAM: CHEST  1 VIEW COMPARISON:  11/09/2021 FINDINGS: Again noted is cardiomegaly and sequela of prior TAVR. Persistent prominent interstitial lung markings, with further increase in densities in the left upper lung and suprahilar region. No definite pleural effusion on this single AP view. No pneumothorax. No acute osseous abnormality. IMPRESSION: Further increase in densities in the left upper lung and suprahilar region, which are nonspecific, with possible etiologies including atelectasis and infection. Electronically Signed   By: Merilyn Baba  M.D.   On: 11/10/2021 16:10   ? ? ?Scheduled Meds: ? apixaban  5 mg Oral BID  ? atorvastatin  40 mg Oral Daily  ? docusate sodium  100 mg Oral BID  ? feeding supplement  237 mL Oral BID BM  ? mouth rinse  15 mL Mouth Rinse BID  ? metoprolol succinate  50 mg Oral BID  ? multivitamin with minerals  1 tablet Oral Daily  ? potassium chloride  20 mEq Oral Daily  ? senna  1 tablet Oral Daily  ? sodium chloride flush  3 mL Intravenous Q12H  ? spironolactone  12.5 mg Oral Daily  ? terazosin  10 mg Oral QHS  ? torsemide  40 mg Oral Q breakfast  ? traMADol  50 mg Oral TID  ? ?Continuous Infusions: ? cefTRIAXone (ROCEPHIN)  IV 2 g (11/11/21 1208)  ? ? ? LOS: 3 days  ? ? ?Time spent: 80min ? ? ? ?Domenic Polite, MD ?[facility] ? ? ?11/11/2021, 2:25 PM  ?  ?

## 2021-11-11 NOTE — Progress Notes (Signed)
Occupational Therapy Treatment ?Patient Details ?Name: Tara Fields ?MRN: [id number] ?DOB: 10-Jan-1928 ?Today's Date: 11/11/2021 ? ? ?History of present illness Pt is a 88 y.o. female who presented 11/08/21 with SOB, and had a productive cough. Pt admitted with CHF exacerbation. PMH: chronic systolic CHF EF 62-37%, h/o TAVr, PAfib on eliquis, COPD, CAD, CKD 3a, aortic stenosis s/p TAVR ?  ?OT comments ? Patient received from mobility specialist as patient had sat herself in a visitor chair in her room and was refusing to move until the mobility specialist found her shoes. Patient able to be redirected with OT, and ambulating in hall. Patient requiring increased assist (mod A) due to impulsive movements, laying head on RW, and pushing RW out in front of her when ambulating. Patient willing to return to room, and sitting EOB, however patient refusing to lay down or have OT place leads on patient (patient removing per mobility specialist). Patient could not be redirected in session, stating she was "going to call the cops, or go sit on the curb". Patient calling son in session, with OT explaining role and situation, with son attempting to intervene over the phone with NT and RN present. Due to safety risk, OT recommending SNF level rehab as patient is currently unsafe to return to independent living. OT will continue to follow.   ? ?Recommendations for follow up therapy are one component of a multi-disciplinary discharge planning process, led by the attending physician.  Recommendations may be updated based on patient status, additional functional criteria and insurance authorization. ?   ?Follow Up Recommendations ? Skilled nursing-short term rehab (<3 hours/day)  ?  ?Assistance Recommended at Discharge Frequent or constant Supervision/Assistance  ?Patient can return home with the following ? A little help with walking and/or transfers;A little help with bathing/dressing/bathroom;Assistance with  cooking/housework;Direct supervision/assist for medications management;Direct supervision/assist for financial management;Assist for transportation;Help with stairs or ramp for entrance ?  ?Equipment Recommendations ? Other (comment) (Defer to next venue)  ?  ?Recommendations for Other Services   ? ?  ?Precautions / Restrictions Precautions ?Precautions: Fall ?Precaution Comments: Watch O2 ?Restrictions ?Weight Bearing Restrictions: No  ? ? ?  ? ?Mobility Bed Mobility ?Overal bed mobility: Needs Assistance ?  ?  ?  ?  ?  ?  ?General bed mobility comments: patient sitting EOB at end of session, but refusing to lay down ?  ? ?Transfers ?Overall transfer level: Needs assistance ?Equipment used: Rolling walker (2 wheels) ?Transfers: Sit to/from Stand ?Sit to Stand: Mod assist ?  ?  ?  ?  ?  ?General transfer comment: increased assist due to significant confusion, when patient is more alert and oriented is able to compete at min gaurd level, but due to impulsivity remained mod A ?  ?  ?Balance Overall balance assessment: Mild deficits observed, not formally tested ?  ?  ?  ?  ?  ?  ?  ?  ?  ?  ?  ?  ?  ?  ?  ?  ?  ?  ?   ? ?ADL either performed or assessed with clinical judgement  ? ?ADL Overall ADL's : Needs assistance/impaired ?  ?  ?  ?  ?  ?  ?  ?  ?  ?  ?Lower Body Dressing: Moderate assistance;Sitting/lateral leans ?  ?Toilet Transfer: Cueing for sequencing;Cueing for safety;Moderate assistance;Rolling walker (2 wheels) ?Toilet Transfer Details (indicate cue type and reason): patient frequently putting walker out in front of her or leaning on  walker due to fatigue, but refusing to sit down or get back in the bed ?  ?  ?  ?  ?Functional mobility during ADLs: Moderate assistance;Rolling walker (2 wheels);Cueing for sequencing;Cueing for safety ?General ADL Comments: Patient again more confused in session, requiring mod A for safety and impulsivity ?  ? ?Extremity/Trunk Assessment   ?  ?  ?  ?  ?  ? ?Vision   ?  ?   ?Perception   ?  ?Praxis   ?  ? ?Cognition Arousal/Alertness: Awake/alert ?Behavior During Therapy: Restless, Anxious, Impulsive ?Overall Cognitive Status: Impaired/Different from baseline ?Area of Impairment: Memory, Attention, Orientation, Following commands, Safety/judgement, Awareness, Problem solving ?  ?  ?  ?  ?  ?  ?  ?  ?Orientation Level: Place, Time, Situation ?Current Attention Level: Sustained ?Memory: Decreased recall of precautions, Decreased short-term memory ?Following Commands: Follows one step commands inconsistently, Follows multi-step commands inconsistently ?Safety/Judgement: Decreased awareness of safety, Decreased awareness of deficits ?Awareness: Intellectual ?Problem Solving: Decreased initiation, Difficulty sequencing, Requires verbal cues, Requires tactile cues, Slow processing ?General Comments: Patient more restless in session to date, wanting to "leave and go sit on curb" would occasionally be redirected but often more frustrated. Handed off to NT and RN at end of session as patient was at a safety risk sitting EOB but refused to lay down ?  ?  ?   ?Exercises   ? ?  ?Shoulder Instructions   ? ? ?  ?General Comments    ? ? ?Pertinent Vitals/ Pain       Pain Assessment ?Pain Assessment: Faces ?Faces Pain Scale: No hurt ? ?Home Living   ?  ?  ?  ?  ?  ?  ?  ?  ?  ?  ?  ?  ?  ?  ?  ?  ?  ?  ? ?  ?Prior Functioning/Environment    ?  ?  ?  ?   ? ?Frequency ? Min 2X/week  ? ? ? ? ?  ?Progress Toward Goals ? ?OT Goals(current goals can now be found in the care plan section) ? Progress towards OT goals: Progressing toward goals ? ?Acute Rehab OT Goals ?Patient Stated Goal: unable to state ?OT Goal Formulation: Patient unable to participate in goal setting ?Time For Goal Achievement: 11/24/21 ?Potential to Achieve Goals: Fair  ?Plan Discharge plan needs to be updated   ? ?Co-evaluation ? ? ?   ?  ?  ?  ?  ? ?  ?AM-PAC OT "6 Clicks" Daily Activity     ?Outcome Measure ? ? Help from another  person eating meals?: A Little ?Help from another person taking care of personal grooming?: A Little ?Help from another person toileting, which includes using toliet, bedpan, or urinal?: A Lot ?Help from another person bathing (including washing, rinsing, drying)?: A Lot ?Help from another person to put on and taking off regular upper body clothing?: A Little ?Help from another person to put on and taking off regular lower body clothing?: A Lot ?6 Click Score: 15 ? ?  ?End of Session Equipment Utilized During Treatment: Rolling walker (2 wheels);Oxygen ? ?OT Visit Diagnosis: Unsteadiness on feet (R26.81);Other abnormalities of gait and mobility (R26.89);Other symptoms and signs involving cognitive function ?  ?Activity Tolerance Treatment limited secondary to agitation;Patient limited by fatigue;Patient limited by lethargy ?  ?Patient Left Other (comment);in bed;with call bell/phone within reach (handed off to NT and RN) ?  ?Nurse Communication Mobility  status;Other (comment) (refusing to lay down or ambulate to chair with alarm for safety) ?  ? ?   ? ?Time: 1771-1657 ?OT Time Calculation (min): 23 min ? ?Charges: OT General Charges ?$OT Visit: 1 Visit ?OT Treatments ?$Self Care/Home Management : 23-37 mins ? ?Corinne Ports E. Onix Jumper, OTR/L ?Acute Rehabilitation Services ?[phone] ?[phone]  ? ?Corinne Ports Raynard Mapps ?11/11/2021, 4:03 PM ?

## 2021-11-12 ENCOUNTER — Inpatient Hospital Stay (HOSPITAL_COMMUNITY): Payer: Medicare Other

## 2021-11-12 DIAGNOSIS — R451 Restlessness and agitation: Secondary | ICD-10-CM | POA: Diagnosis not present

## 2021-11-12 DIAGNOSIS — Z515 Encounter for palliative care: Secondary | ICD-10-CM | POA: Diagnosis not present

## 2021-11-12 DIAGNOSIS — I5023 Acute on chronic systolic (congestive) heart failure: Secondary | ICD-10-CM | POA: Diagnosis not present

## 2021-11-12 DIAGNOSIS — Z7189 Other specified counseling: Secondary | ICD-10-CM | POA: Diagnosis not present

## 2021-11-12 LAB — BASIC METABOLIC PANEL
Anion gap: 11 (ref 5–15)
BUN: 44 mg/dL — ABNORMAL HIGH (ref 8–23)
CO2: 26 mmol/L (ref 22–32)
Calcium: 9.2 mg/dL (ref 8.9–10.3)
Chloride: 104 mmol/L (ref 98–111)
Creatinine, Ser: 1.35 mg/dL — ABNORMAL HIGH (ref 0.44–1.00)
GFR, Estimated: 37 mL/min — ABNORMAL LOW (ref >60)
Glucose, Bld: 167 mg/dL — ABNORMAL HIGH (ref 70–99)
Potassium: 5.1 mmol/L (ref 3.5–5.1)
Sodium: 141 mmol/L (ref 135–145)

## 2021-11-12 LAB — CBC
HCT: 32.7 % — ABNORMAL LOW (ref 36.0–46.0)
Hemoglobin: 10.9 g/dL — ABNORMAL LOW (ref 12.0–15.0)
MCH: 30.5 pg (ref 26.0–34.0)
MCHC: 33.3 g/dL (ref 30.0–36.0)
MCV: 91.6 fL (ref 80.0–100.0)
Platelets: 222 10*3/uL (ref 150–400)
RBC: 3.57 MIL/uL — ABNORMAL LOW (ref 3.87–5.11)
RDW: 15.6 % — ABNORMAL HIGH (ref 11.5–15.5)
WBC: 6.1 10*3/uL (ref 4.0–10.5)
nRBC: 0 % (ref 0.0–0.2)

## 2021-11-12 LAB — MAGNESIUM: Magnesium: 2.3 mg/dL (ref 1.7–2.4)

## 2021-11-12 MED ORDER — IPRATROPIUM-ALBUTEROL 0.5-2.5 (3) MG/3ML IN SOLN
3.0000 mL | Freq: Four times a day (QID) | RESPIRATORY_TRACT | Status: DC
  Administered 2021-11-12 – 2021-11-15 (×12): 3 mL via RESPIRATORY_TRACT
  Filled 2021-11-12 (×11): qty 3

## 2021-11-12 MED ORDER — HALOPERIDOL LACTATE 5 MG/ML IJ SOLN
2.0000 mg | Freq: Once | INTRAMUSCULAR | Status: AC
  Administered 2021-11-12: 2 mg via INTRAVENOUS
  Filled 2021-11-12: qty 1

## 2021-11-12 MED ORDER — QUETIAPINE FUMARATE 25 MG PO TABS
25.0000 mg | ORAL_TABLET | Freq: Every evening | ORAL | Status: DC
Start: 2021-11-12 — End: 2021-11-15
  Administered 2021-11-12 – 2021-11-14 (×3): 25 mg via ORAL
  Filled 2021-11-12 (×3): qty 1

## 2021-11-12 NOTE — Progress Notes (Signed)
Palliative: ? ?HPI: 88 y.o. female  with past medical history of CAD, s/p TAVR, COPD, HTN, HLD, afib on Eliquis, chronic systolic CHF EF 16-10%, CKD stage 3a, diabetes, cognitive impairment admitted on 11/08/2021 from independent living with shortness of breath with productive cough related to heart failure exacerbation. Has diuresed well. Hospitalization complicated by pneumonia, UTI, worsening afib, and increased agitation/confusion.   ? ?I met today at Tara Fields's bedside with son, Tara Fields. Tara Fields had another difficult night. She has ongoing confusion and agitation overnight. She is just now starting to be sleepy after multiple doses of haldol overnight. Discussed with Dr. Broadus John at bedside adding Zyprexa vs Seroquel to assist with agitation and delirium.  ? ?I spoke more with Tara Fields about delirium and hopes of improvement but also difficult to know where this will end up. We discussed all her underlying health issues and how this can lead to further decline gradual or acute. Since Tara Fields has had similar agitation during previous hospitalizations he continues to hopeful she will improve with time but also realistic that her baseline may change and she may need more help. We discussed multiple avenues from here as far as return to [place] with 24/7 care, SNF rehab, ALF, and even hospice support. He has another family member that has hospice at home for similar situation (heart failure). Tara Fields is open to guidance and suggestions. He knows that his mother would not want resuscitation but they have not really had many other conversations as far as acceptable quality of life for her. He continues to be open to medications to better relieve symptoms.  ? ?All questions/concerns addressed. Emotional support provided.  ? ?Exam: Fluctuating mental status, confused. Very anxious. Potential sleep apnea? Wheezing and tachypnea with anxiety. Abd soft, less tender than yesterday. Moves all extremities.  ? ?Plan: ?-  Severe arthritic pain: Tramadol and Tylenol every 8 hours.  ?- Agitation: Seroquel 25 qhs added by Dr. Broadus John.  ?- Bowel Regimen: Senokot daily.  ?- Watchful waiting. Continued palliative conversations dependent on progression/outcomes.  ? ?50 MIN ? ?Vinie Sill, NP ?Palliative Medicine Team ?Pager [phone] (Please see [email or web address] for schedule) ?Team Phone [phone]  ? ? ?Greater than 50%  of this time was spent counseling and coordinating care related to the above assessment and plan   ?

## 2021-11-12 NOTE — Care Management Important Message (Signed)
Important Message ? ?Patient Details  ?Name: Tara Fields ?MRN: [id number] ?Date of Birth: [birth year] ? ? ?Medicare Important Message Given:  Yes ? ? ? ? ?Shelda Altes ?11/12/2021, 10:17 AM ?

## 2021-11-12 NOTE — Progress Notes (Signed)
?PROGRESS NOTE ? ? ? ?Tara Fields  YHC:[id number] DOB: 1927-09-13 DOA: 11/08/2021 ?PCP: [name], NP  ?Narrative [name] with history of CAD, chronic systolic CHF, s/p TAVR, COPD, type 2 diabetes mellitus, hypertension, paroxysmal atrial fibrillation presented to the ED with dyspnea, had recent URI and productive cough as well. ?-In the ED BNP was 1638, chest x-ray noted cardiomegaly and pulmonary vascular congestion,, repeat chest x-ray with additional opacities, concerning for pneumonia/atelectasis ?-Diuresed with IV Lasix and then subsequently started antibiotics for pneumonia ?-Hospital course complicated by agitation and delirium ? ? ?Subjective: ?-Extremely agitated last night, required multiple doses of Haldol, breathing improving, some cough ? ?Assessment & Plan: ? ? ?Acute hypoxic respiratory failure  ?Community-acquired pneumonia  ?-Remains on IV ceftriaxone day 4 ?-Wean O2 ?-Incentive spirometry as tolerated, repeat chest x-ray is unchanged ?-Duo nebs, out of bed to chair, pulmonary toilet ?-SLP evaluation ruled out aspiration ?-Ambulate, PT eval ? ?Agitation, delirium ?-Likely hospital delirium in the setting of pneumonia, CHF ?-Labs unremarkable, add laxative ?-Add Seroquel Qpm ? ?Acute on chronic systolic CHF (congestive heart failure) ([place]) ?Her last echocardiogram from 2022 had a reduced LV systolic function 25 to 51% with global hypokinesis. Preserved RV systolic function ?-Diuresed with IV Lasix, BNP was 1638 on admission ?-Clinically appears euvolemic, continue torsemide, Toprol, Aldactone ?-Potassium trending up, monitor ? ?Persistent atrial fibrillation ([place]) ?-With RVR, started on amiodarone, now p.o. ?-Continue Eliquis ? ?CKD (chronic kidney disease), stage III ([place]) ?CKD stage 3a, hypokalemia. ?-Stable, monitor ? ?Iron deficiency anemia ?-Hemoglobin relatively stable, add IV iron ? ?HTN (hypertension) ?-Continue metoprolol, Aldactone ? ?Type 2 diabetes mellitus with hyperlipidemia  ([place]) ?-CBGs are stable, continue sliding scale insulin ? ?Malnutrition of moderate degree ?Continue with nutritional supplementation.  ? ?Abnormal UA ?-Patient denies any symptoms of UTI, continue ABX for pneumonia ? ?DVT prophylaxis: Apixaban ?Code Status: DNR ?Family Communication: Discussed with patient in detail, no family at bedside ?Disposition Plan: Back to ALF once stable, may need hospice services ? ?Consultants:  ?Cardiology ? ?Procedures:  ? ?Antimicrobials:  ? ? ?Objective: ?Vitals:  ? 11/11/21 2317 11/12/21 0025 11/12/21 0239 11/12/21 1029  ?BP: (!) 135/93 117/75    ?Pulse: 83     ?Resp: 16 19    ?Temp: 98.1 ?F (36.7 ?C)     ?TempSrc: Oral     ?SpO2: 100%   98%  ?Weight:   63.4 kg   ?Height:      ? ? ?Intake/Output Summary (Last 24 hours) at 11/12/2021 1157 ?Last data filed at 11/12/2021 0900 ?Gross per 24 hour  ?Intake 801 ml  ?Output 800 ml  ?Net 1 ml  ? ?Filed Weights  ? 11/10/21 0402 11/11/21 0100 11/12/21 0239  ?Weight: 66.1 kg 64.1 kg 63.4 kg  ? ? ?Examination: ? ?General exam: Pleasant elderly female sitting up in bed, somnolent but arousable, oriented to self and partly to place, moderate cognitive deficits ?HEENT: No JVD ?CVs: S1-S2, irregularly irregular rhythm, systolic murmur ?Lungs: Rhonchi at both bases ?Abdomen: Soft, nontender, bowel sounds present ?Extremities: No edema ?Skin: No rashes ?Psychiatry: Poor insight and judgment ? ?Data Reviewed:  ? ?CBC: ?Recent Labs  ?Lab 11/08/21 ?7616 11/09/21 ?0737 11/10/21 ?1062 11/11/21 ?6948 11/12/21 ?5462  ?WBC 9.9 7.6 7.0 7.4 6.1  ?HGB 10.9* 10.9* 10.5* 10.5* 10.9*  ?HCT 35.0* 33.1* 33.3* 32.4* 32.7*  ?MCV 93.1 93.0 94.1 92.0 91.6  ?PLT 224 203 207 230 222  ? ?Basic Metabolic Panel: ?Recent Labs  ?Lab 11/08/21 ?7035 11/09/21 ?0093 11/10/21 ?8182 11/11/21 ?9937  11/12/21 ?0603  ?NA 139 138 137 135 141  ?K 4.1 3.5 3.6 4.2 5.1  ?CL 103 104 100 102 104  ?CO2 22 22 28 22 26   ?GLUCOSE 182* 166* 162* 156* 167*  ?BUN 38* 35* 35* 42* 44*  ?CREATININE 1.17*  1.08* 1.07* 1.29* 1.35*  ?CALCIUM 9.3 8.8* 8.9 8.4* 9.2  ?MG 2.2  --  2.1 2.1 2.3  ? ?GFR: ?Estimated Creatinine Clearance: 22.6 mL/min (A) (by C-G formula based on SCr of 1.35 mg/dL (H)). ?Liver Function Tests: ?Recent Labs  ?Lab 11/08/21 ?7209  ?AST 21  ?ALT 12  ?ALKPHOS 61  ?BILITOT 0.8  ?PROT 6.9  ?ALBUMIN 3.9  ? ?No results for input(s): LIPASE, AMYLASE in the last 168 hours. ?No results for input(s): AMMONIA in the last 168 hours. ?Coagulation Profile: ?No results for input(s): INR, PROTIME in the last 168 hours. ?Cardiac Enzymes: ?No results for input(s): CKTOTAL, CKMB, CKMBINDEX, TROPONINI in the last 168 hours. ?BNP (last 3 results) ?No results for input(s): PROBNP in the last 8760 hours. ?HbA1C: ?No results for input(s): HGBA1C in the last 72 hours. ?CBG: ?Recent Labs  ?Lab 11/08/21 ?1556  ?GLUCAP 120*  ? ?Lipid Profile: ?No results for input(s): CHOL, HDL, LDLCALC, TRIG, CHOLHDL, LDLDIRECT in the last 72 hours. ?Thyroid Function Tests: ?No results for input(s): TSH, T4TOTAL, FREET4, T3FREE, THYROIDAB in the last 72 hours. ?Anemia Panel: ?Recent Labs  ?  11/10/21 ?0653  ?[id number] 1,589*  ?FOLATE 35.2  ?FERRITIN 145  ?TIBC 346  ?IRON 22*  ?RETICCTPCT 2.0  ? ?Urine analysis: ?   ?Component Value Date/Time  ? [place] YELLOW 11/09/2021 1535  ? APPEARANCEUR HAZY (A) 11/09/2021 1535  ? LABSPEC 1.012 11/09/2021 1535  ? PHURINE 6.0 11/09/2021 1535  ? [place] NEGATIVE 11/09/2021 1535  ? GLUCOSEU NEGATIVE 12/11/2019 1033  ? HGBUR SMALL (A) 11/09/2021 1535  ? [place] NEGATIVE 11/09/2021 1535  ? [place] NEGATIVE 11/09/2021 1535  ? [place] NEGATIVE 11/09/2021 1535  ? UROBILINOGEN 0.2 12/11/2019 1033  ? NITRITE NEGATIVE 11/09/2021 1535  ? LEUKOCYTESUR LARGE (A) 11/09/2021 1535  ? ?Sepsis Labs: ?@LABRCNTIP (procalcitonin:4,lacticidven:4) ? ?)No results found for this or any previous visit (from the past 240 hour(s)).  ? ?Radiology Studies: ?DG Chest 1 View ? ?Result Date: 11/10/2021 ?CLINICAL DATA:  Dyspnea  EXAM: CHEST  1 VIEW COMPARISON:  11/09/2021 FINDINGS: Again noted is cardiomegaly and sequela of prior TAVR. Persistent prominent interstitial lung markings, with further increase in densities in the left upper lung and suprahilar region. No definite pleural effusion on this single AP view. No pneumothorax. No acute osseous abnormality. IMPRESSION: Further increase in densities in the left upper lung and suprahilar region, which are nonspecific, with possible etiologies including atelectasis and infection. Electronically Signed   By: Merilyn Baba M.D.   On: 11/10/2021 16:10  ? ?DG Chest 2 View ? ?Result Date: 11/12/2021 ?CLINICAL DATA:  Shortness of breath. EXAM: CHEST - 2 VIEW COMPARISON:  November 10, 2021.  March 04, 2021. FINDINGS: Stable cardiomegaly. Status post transcatheter aortic valve repair. Stable interstitial densities are noted throughout both lungs which may represent edema or atypical inflammation. Stable left perihilar opacity is noted which may represent post radiation fibrosis. Minimal pleural effusions may be present. Bony thorax is unremarkable. IMPRESSION: Stable interstitial densities are noted throughout both lungs which may represent edema or atypical inflammation. Minimal pleural effusions may be present. Electronically Signed   By: Marijo Conception M.D.   On: 11/12/2021 09:49   ? ? ?Scheduled Meds: ? acetaminophen  1,000 mg Oral Q8H  ? apixaban  5 mg Oral BID  ? atorvastatin  40 mg Oral Daily  ? divalproex  125 mg Oral Q12H  ? docusate sodium  100 mg Oral BID  ? feeding supplement  237 mL Oral BID BM  ? ipratropium-albuterol  3 mL Nebulization QID  ? mouth rinse  15 mL Mouth Rinse BID  ? metoprolol succinate  50 mg Oral BID  ? multivitamin with minerals  1 tablet Oral Daily  ? QUEtiapine  25 mg Oral QPM  ? senna  1 tablet Oral Daily  ? sodium chloride flush  3 mL Intravenous Q12H  ? spironolactone  12.5 mg Oral Daily  ? terazosin  10 mg Oral QHS  ? torsemide  40 mg Oral Q breakfast  ? traMADol   50 mg Oral TID  ? ?Continuous Infusions: ? cefTRIAXone (ROCEPHIN)  IV 2 g (11/11/21 1208)  ? ? ? LOS: 4 days  ? ? ?Time spent: 48min ? ? ? ?Domenic Polite, MD ?[facility] ? ? ?11/12/2021, 11:57 AM

## 2021-11-12 NOTE — Progress Notes (Signed)
Physical Therapy Treatment ?Patient Details ?Name: Tara Fields ?MRN: [id number] ?DOB: 1927/12/27 ?Today's Date: 11/12/2021 ? ? ?History of Present Illness Pt is a 88 y.o. female who presented 11/08/21 with SOB, and had a productive cough. Pt admitted with CHF exacerbation. PMH: chronic systolic CHF EF 58-52%, h/o TAVr, PAfib on eliquis, COPD, CAD, CKD 3a, aortic stenosis s/p TAVR ? ?  ?PT Comments  ? ? The pt was agreeable to session and able to follow simple cues and commands this session, but continues to need significant cues for sequencing tasks such as using the bathroom or washing her hands. The pt was able to ambulate ~25 ft in the room with minA to steady and to manage steering of RW. She is making progress cognitively from previous sessions, but I continue to recommend frequent/constant assist and supervision for safe mobility and self-care in the home after d/c.  ?   ?Recommendations for follow up therapy are one component of a multi-disciplinary discharge planning process, led by the attending physician.  Recommendations may be updated based on patient status, additional functional criteria and insurance authorization. ? ?Follow Up Recommendations ? Skilled nursing-short term rehab (<3 hours/day) (or HHPT if frequent care/supervision is available) ?  ?  ?Assistance Recommended at Discharge Frequent or constant Supervision/Assistance  ?Patient can return home with the following A little help with walking and/or transfers;A little help with bathing/dressing/bathroom;Direct supervision/assist for medications management;Direct supervision/assist for financial management;Assist for transportation;Help with stairs or ramp for entrance ?  ?Equipment Recommendations ? Rolling walker (2 wheels) (youth height RW (pt 25ft 0" tall))  ?  ?Recommendations for Other Services   ? ? ?  ?Precautions / Restrictions Precautions ?Precautions: Fall ?Precaution Comments: Watch O2, on 1-2L this session ?Restrictions ?Weight  Bearing Restrictions: No  ?  ? ?Mobility ? Bed Mobility ?Overal bed mobility: Needs Assistance ?Bed Mobility: Supine to Sit, Sit to Supine ?  ?  ?Supine to sit: Min guard ?Sit to supine: Min guard ?  ?General bed mobility comments: increased time, minG for lines ?  ? ?Transfers ?Overall transfer level: Needs assistance ?Equipment used: Rolling walker (2 wheels) ?Transfers: Sit to/from Stand ?Sit to Stand: Min assist ?  ?  ?  ?  ?  ?General transfer comment: pt using single UE support from low toilet and from EOB with minA to power up to standing. ?  ? ?Ambulation/Gait ?Ambulation/Gait assistance: Min guard, Min assist ?Gait Distance (Feet): 25 Feet ?Assistive device: Rolling walker (2 wheels) ?Gait Pattern/deviations: Step-through pattern, Decreased stride length, Drifts right/left, Trunk flexed, Leaning posteriorly ?Gait velocity: decreased ?Gait velocity interpretation: <1.31 ft/sec, indicative of household ambulator ?  ?General Gait Details: minA to guide RW and steady with gait. no LOB but needing assist to direct around the room. ? ? ? ?  ?Balance Overall balance assessment: Mild deficits observed, not formally tested ?  ?  ?  ?  ?  ?  ?  ?  ?  ?  ?  ?  ?  ?  ?  ?  ?  ?  ?  ? ?  ?Cognition Arousal/Alertness: Awake/alert ?Behavior During Therapy: Restless, Anxious, Impulsive ?Overall Cognitive Status: Impaired/Different from baseline ?Area of Impairment: Memory, Attention, Orientation, Following commands, Safety/judgement, Awareness, Problem solving ?  ?  ?  ?  ?  ?  ?  ?  ?Orientation Level: Place, Time, Situation ?Current Attention Level: Sustained ?Memory: Decreased recall of precautions, Decreased short-term memory ?Following Commands: Follows one step commands consistently, Follows one step commands  with increased time ?Safety/Judgement: Decreased awareness of safety, Decreased awareness of deficits ?Awareness: Intellectual ?Problem Solving: Decreased initiation, Difficulty sequencing, Requires verbal cues,  Requires tactile cues, Slow processing ?General Comments: pt calm but disoriented at this time, able to follow simple cues but needing repeated cues to sequence tasks such as washing her hands ?  ?  ? ?  ?Exercises   ? ?  ?General Comments General comments (skin integrity, edema, etc.): VSS on 2L, 3/4 DOE after activity but Spo2 in 90s once it got a good reading ?  ?  ? ?Pertinent Vitals/Pain Pain Assessment ?Pain Assessment: No/denies pain ?Pain Score: 0-No pain ?Pain Intervention(s): Monitored during session  ? ? ? ?PT Goals (current goals can now be found in the care plan section) Acute Rehab PT Goals ?Patient Stated Goal: to go to bathroom ?PT Goal Formulation: With patient ?Time For Goal Achievement: 11/23/21 ?Potential to Achieve Goals: Good ?Progress towards PT goals: Progressing toward goals ? ?  ?Frequency ? ? ? Min 3X/week ? ? ? ?  ?PT Plan Current plan remains appropriate  ? ? ?   ?AM-PAC PT "6 Clicks" Mobility   ?Outcome Measure ? Help needed turning from your back to your side while in a flat bed without using bedrails?: A Little ?Help needed moving from lying on your back to sitting on the side of a flat bed without using bedrails?: A Little ?Help needed moving to and from a bed to a chair (including a wheelchair)?: A Little ?Help needed standing up from a chair using your arms (e.g., wheelchair or bedside chair)?: A Lot ?Help needed to walk in hospital room?: A Lot ?Help needed climbing 3-5 steps with a railing? : A Lot ?6 Click Score: 15 ? ?  ?End of Session Equipment Utilized During Treatment: Gait belt;Oxygen ?Activity Tolerance: Patient tolerated treatment well;Patient limited by fatigue ?Patient left: with call bell/phone within reach;in bed;with nursing/sitter in room ?Nurse Communication: Mobility status ?PT Visit Diagnosis: Other abnormalities of gait and mobility (R26.89);Unsteadiness on feet (R26.81);Muscle weakness (generalized) (M62.81) ?  ? ? ?Time: 6468-0321 ?PT Time Calculation (min)  (ACUTE ONLY): 27 min ? ?Charges:  $Therapeutic Exercise: 8-22 mins ?$Therapeutic Activity: 8-22 mins          ?          ? ?[place], PT, DPT  ? ?Acute Rehabilitation Department ?Pager #: [phone] ? ? ?Sandra Cockayne ?11/12/2021, 3:24 PM ? ?

## 2021-11-12 NOTE — Progress Notes (Addendum)
? ? Advanced Heart Failure Rounding Note ? ?PCP-Cardiologist: Lauree Chandler, MD  ? ?Subjective:   ? ?Yesterday switched to po amio.  ? ?Nursing staff reporting confusion at night. Having a hard time sleeping. Denies SOB>  ? ? ? ?Objective:   ?Weight Range: ?63.4 kg ?Body mass index is 27.3 kg/m?.  ? ?Vital Signs:   ?Temp:  [98 ?F (36.7 ?C)-98.1 ?F (36.7 ?C)] 98.1 ?F (36.7 ?C) (03/29 2317) ?Pulse Rate:  [79-107] 83 (03/29 2317) ?Resp:  [13-19] 19 (03/30 0025) ?BP: (102-135)/(74-93) 117/75 (03/30 0025) ?SpO2:  [96 %-100 %] 100 % (03/29 2317) ?Weight:  [63.4 kg] 63.4 kg (03/30 0239) ?Last BM Date : 11/11/21 ? ?Weight change: ?Filed Weights  ? 11/10/21 0402 11/11/21 0100 11/12/21 0239  ?Weight: 66.1 kg 64.1 kg 63.4 kg  ? ? ?Intake/Output:  ? ?Intake/Output Summary (Last 24 hours) at 11/12/2021 0820 ?Last data filed at 11/12/2021 0600 ?Gross per 24 hour  ?Intake 719 ml  ?Output 700 ml  ?Net 19 ml  ?  ? ? ?Physical Exam  ? General:  In bed. No resp difficulty ?HEENT: normal ?Neck: supple. no JVD. Carotids 2+ bilat; no bruits. No lymphadenopathy or thryomegaly appreciated. ?Cor: PMI nondisplaced. Irregular rate & rhythm. No rubs, gallops or murmurs. ?Lungs: clear ?Abdomen: soft, nontender, nondistended. No hepatosplenomegaly. No bruits or masses. Good bowel sounds. ?Extremities: no cyanosis, clubbing, rash, edema ?Neuro: alert & orientedx3, cranial nerves grossly intact. moves all 4 extremities w/o difficulty. Affect pleasant ? ? ? ?Telemetry  ? ?A fib 70-80s  ? ?Labs  ?  ?CBC ?Recent Labs  ?  11/11/21 ?4259 11/12/21 ?5638  ?WBC 7.4 6.1  ?HGB 10.5* 10.9*  ?HCT 32.4* 32.7*  ?MCV 92.0 91.6  ?PLT 230 222  ? ?Basic Metabolic Panel ?Recent Labs  ?  11/11/21 ?0409 11/12/21 ?0603  ?NA 135 141  ?K 4.2 5.1  ?CL 102 104  ?CO2 22 26  ?GLUCOSE 156* 167*  ?BUN 42* 44*  ?CREATININE 1.29* 1.35*  ?CALCIUM 8.4* 9.2  ?MG 2.1 2.3  ? ?Liver Function Tests ?No results for input(s): AST, ALT, ALKPHOS, BILITOT, PROT, ALBUMIN in the last  72 hours. ? ?No results for input(s): LIPASE, AMYLASE in the last 72 hours. ?Cardiac Enzymes ?No results for input(s): CKTOTAL, CKMB, CKMBINDEX, TROPONINI in the last 72 hours. ? ?BNP: ?BNP (last 3 results) ?Recent Labs  ?  06/07/21 ?0955 09/02/21 ?7564 11/08/21 ?3329  ?BNP 3,284.2* 1,377.5* 1,638.3*  ? ? ?ProBNP (last 3 results) ?No results for input(s): PROBNP in the last 8760 hours. ? ? ?D-Dimer ?No results for input(s): DDIMER in the last 72 hours. ?Hemoglobin A1C ?No results for input(s): HGBA1C in the last 72 hours. ?Fasting Lipid Panel ?No results for input(s): CHOL, HDL, LDLCALC, TRIG, CHOLHDL, LDLDIRECT in the last 72 hours. ?Thyroid Function Tests ?No results for input(s): TSH, T4TOTAL, T3FREE, THYROIDAB in the last 72 hours. ? ?Invalid input(s): FREET3 ? ?Other results: ? ? ?Imaging  ? ? ?No results found. ? ? ?Medications:   ? ? ?Scheduled Medications: ? acetaminophen  1,000 mg Oral Q8H  ? apixaban  5 mg Oral BID  ? atorvastatin  40 mg Oral Daily  ? divalproex  125 mg Oral Q12H  ? docusate sodium  100 mg Oral BID  ? feeding supplement  237 mL Oral BID BM  ? haloperidol lactate  2 mg Intravenous Once  ? mouth rinse  15 mL Mouth Rinse BID  ? metoprolol succinate  50 mg Oral BID  ?  multivitamin with minerals  1 tablet Oral Daily  ? potassium chloride  20 mEq Oral Daily  ? senna  1 tablet Oral Daily  ? sodium chloride flush  3 mL Intravenous Q12H  ? spironolactone  12.5 mg Oral Daily  ? terazosin  10 mg Oral QHS  ? torsemide  40 mg Oral Q breakfast  ? traMADol  50 mg Oral TID  ? ? ?Infusions: ? cefTRIAXone (ROCEPHIN)  IV 2 g (11/11/21 1208)  ? ? ?PRN Medications: ?albuterol, bisacodyl, polyethylene glycol ? ? ? ?Patient Profile  ?88 year old with a h/o HFrEF, chronic a fib, CAD, CKD Stage IIIa, S/P TAVR 2020 , HTN, and HLD. Admitted with A/C HFrEF.  ? ?Assessment/Plan  ? ?1. A/C HFrEF  ?-Echo EF 25-30% back in May 2022. Ischemic work up was not pursued per patient request. . Last cath back in 2019 at [place]  with 25% mid LAD stenosis, 50% stenosis second diagonal branch, 50% distal Circumflex stenosis, 75% ostial stenosis of a small Diagonal branch. ?- Echo EF 20-25%, severely dilated LV, RV okay, severe BAE, mild MR, 26 mm S3 in aortic position with mild AI, IVC dilated with RAP 15 mmHg. ?-BNP on admit 1638. Reds Clip 33% not suggestive of fluid accumulation. She was diuresed with IV lasix which has been stopped.  ?- Volume status stable. Continue po Torsemide 40 mg q am. Favor daily diuretic versus twice daily.   ?- Continue Toprol  to 50 mg twice daily.   ?- Continue spiro 12.5 mg daily. Would not increased K 5.1 ?- Avoid SGLT2i with current UTI ?- Renal function stable.  ?  ?2. A Fib RVR , know chronic a fib ?- Started on amio drip for rate control now transitioned to po.  ?- Will stop amiodarone and continue Toprol XL.  ?- Continue eliquis 5 mg twice a day  ? ?3. Suspect PNA ?- 3/28 CXR with new densities in left upper lung and left suprahilar region, ? Aspiration.  ?- Speech evaluated, mild aspiration risk. ?- On antibiotics per primary team. ? ?4. CKD Stage IIIa ?- Creatinine baseline 1-1.2 ?- Stable trending up 1.1>1.3 >1.35  ?  ?5. H/O CAD ?-Cath 10/2017 at [place] with 25% mid LAD stenosis, 50% stenosis second diagonal branch, 50% distal Circumflex stenosis, 75% ostial stenosis of a small diagonal branch. ?- HS Trop 9>12 . No chest pain.  ?  ?6. S/P TAVR 2020 with 26 mm S3 at [place] ?- Mild AI on echo ?- She is on eliquis so stopped aspirin.  ?  ?7. Cognitive Deficits ?-Intermittent confusion, alert this am ?-? How much UTI contributing.  ? ?8. UTI ?- Now on abx per primary ? ?9. Anemia ?- Hgb 10.9 ?- Iron sats 6%.  ?- Given feraheme ? ?10. DNR  ?-May need Hospice at D/C  ?-Palliative care consulted for [place] discussion ? ?Plan for SNF.  ? ?Stable from HF perspective.  ?HF meds for d/c  ?Toprol XL 50 mg twice a day  ?Spiro 12.5 mg daily  ?Eliquis 5 mg twice a day  ?Torsemide 40 daily ? ?HF Team available as needed.   ? ?Heart failure team will sign off as of 11/12/21 ? ? ?Length of Stay: 4 ? ?Darrick Grinder, NP  ?11/12/2021, 8:20 AM ? ?Advanced Heart Failure Team ?Pager [phone] (M-F; 7a - 5p)  ?Please contact [facility] for night-coverage after hours (5p -7a ) and weekends on [email or web address] ?  ?Patient seen with NP, agree with the above  note.   ? ?HR 90s in atrial fibrillation this morning.  Creatinine 1.35.   ? ?Some confusion overnight, has sitter.  ? ?General: NAD ?Neck: No JVD, no thyromegaly or thyroid nodule.  ?Lungs: Rhonchorous. ?CV: Nondisplaced PMI.  Heart irregular S1/S2, no S3/S4, no murmur.  No peripheral edema.   ?Abdomen: Soft, nontender, no hepatosplenomegaly, no distention.  ?Skin: Intact without lesions or rashes.  ?Neurologic: Alert and oriented x 3.  ?Psych: Normal affect. ?Extremities: No clubbing or cyanosis.  ?HEENT: Normal.  ? ?Main issue here may be PNA rather than CHF.  She is on ceftriaxone, lungs still rhonchorous on exam.  She does not appear volume overloaded.  ?- Continue torsemide 40 mg daily.  ?- Continue spironolactone 12.5 daily and current Toprol XL.  ?- No ARNI/ARB with elevated creatinine.  ?- No SGLT2 inhibitor with UTI.  ?- Repeat CXR.  ? ?She has permanent atrial fibrillation.  Rate is controlled this morning.  ?- Continue Toprol XL 50 mg bid.  ?- Continue apixaban.  ?- Will stop amiodarone.  ? ?Cardiology to sign off, meds as above for d/c.  ? ?Amy Clegg ?11/12/2021 ?8:20 AM ? ?

## 2021-11-12 NOTE — Progress Notes (Signed)
SLP Cancellation Note ? ?Patient Details ?Name: Tara Fields ?MRN: [id number] ?DOB: Apr 11, 1928 ? ? ?Cancelled treatment:       Reason Eval/Treat Not Completed: Patient's level of consciousness. Patient asleep in bed and family member in room. As patient has been agitated, confused, did not attempt to wake up at this time. SLP will continue to follow patient for PO toleration. ? ?Sonia Baller, MA, CCC-SLP ?Speech Therapy ? ?

## 2021-11-13 DIAGNOSIS — I5023 Acute on chronic systolic (congestive) heart failure: Secondary | ICD-10-CM | POA: Diagnosis not present

## 2021-11-13 DIAGNOSIS — R52 Pain, unspecified: Secondary | ICD-10-CM

## 2021-11-13 LAB — BASIC METABOLIC PANEL
Anion gap: 13 (ref 5–15)
BUN: 44 mg/dL — ABNORMAL HIGH (ref 8–23)
CO2: 25 mmol/L (ref 22–32)
Calcium: 9.1 mg/dL (ref 8.9–10.3)
Chloride: 102 mmol/L (ref 98–111)
Creatinine, Ser: 1.3 mg/dL — ABNORMAL HIGH (ref 0.44–1.00)
GFR, Estimated: 39 mL/min — ABNORMAL LOW (ref >60)
Glucose, Bld: 140 mg/dL — ABNORMAL HIGH (ref 70–99)
Potassium: 3.7 mmol/L (ref 3.5–5.1)
Sodium: 140 mmol/L (ref 135–145)

## 2021-11-13 LAB — CBC
HCT: 31.1 % — ABNORMAL LOW (ref 36.0–46.0)
Hemoglobin: 10.2 g/dL — ABNORMAL LOW (ref 12.0–15.0)
MCH: 29.7 pg (ref 26.0–34.0)
MCHC: 32.8 g/dL (ref 30.0–36.0)
MCV: 90.4 fL (ref 80.0–100.0)
Platelets: 217 10*3/uL (ref 150–400)
RBC: 3.44 MIL/uL — ABNORMAL LOW (ref 3.87–5.11)
RDW: 15.6 % — ABNORMAL HIGH (ref 11.5–15.5)
WBC: 5.8 10*3/uL (ref 4.0–10.5)
nRBC: 0 % (ref 0.0–0.2)

## 2021-11-13 MED ORDER — POTASSIUM CHLORIDE CRYS ER 20 MEQ PO TBCR
40.0000 meq | EXTENDED_RELEASE_TABLET | Freq: Once | ORAL | Status: AC
  Administered 2021-11-13: 40 meq via ORAL
  Filled 2021-11-13: qty 2

## 2021-11-13 MED ORDER — ONDANSETRON HCL 4 MG/2ML IJ SOLN
4.0000 mg | Freq: Four times a day (QID) | INTRAMUSCULAR | Status: AC | PRN
  Administered 2021-11-13: 4 mg via INTRAVENOUS
  Filled 2021-11-13: qty 2

## 2021-11-13 MED ORDER — PNEUMOCOCCAL 20-VAL CONJ VACC 0.5 ML IM SUSY
0.5000 mL | PREFILLED_SYRINGE | Freq: Once | INTRAMUSCULAR | Status: AC
  Administered 2021-11-15: 0.5 mL via INTRAMUSCULAR
  Filled 2021-11-13: qty 0.5

## 2021-11-13 NOTE — Progress Notes (Signed)
[facility] Hospital Liaison Note  Notified by TOC manager of patient/family request for [facility] palliative services at home after discharge.   [facility] hospital liaison will follow patient for discharge disposition.   Please call with any hospice or outpatient palliative care related questions.   Thank you for the opportunity to participate in this patient's care.   Shanita Wicker, LCSW [facility] Hospital Liaison [phone]  

## 2021-11-13 NOTE — Progress Notes (Signed)
Speech Language Pathology Treatment: Dysphagia  ?Patient Details ?Name: Tara Fields ?MRN: [id number] ?DOB: 1927/12/19 ?Today's Date: 11/13/2021 ?Time: 9672-2773 ?SLP Time Calculation (min) (ACUTE ONLY): 9 min ? ?Assessment / Plan / Recommendation ?Clinical Impression ? Pt sleep in chair, but awakened upon SLP arrival with verbal greeting. RN and sitter in room report pt with improved overall alertness and with no difficulties during PO intake. Pt consumed thin liquids via straw and bites of mechanical soft/regular textures without overt s/sx of aspiration. All solids orally cleared without difficulty. Pt very talkative this date, consuming POs and having conversation with clinician, however this did not appear to compromise swallow function at bedside. Recommend continue current diet with full supervision from staff due to continued poor mentation. No further SLP f/u warranted at this time. Will s/o.  ?  ?HPI HPI: Pt is a 88 y.o. female who presented 11/08/21 with SOB, and had a productive cough. Pt admitted with CHF exacerbation. PMH: chronic systolic CHF EF 75-05%, h/o TAVr, PAfib on eliquis, COPD, CAD, CKD 3a, aortic stenosis s/p TAVR ?  ?   ?SLP Plan ? Discharge SLP treatment due to (comment);All goals met ? ?  ?  ?Recommendations for follow up therapy are one component of a multi-disciplinary discharge planning process, led by the attending physician.  Recommendations may be updated based on patient status, additional functional criteria and insurance authorization. ?  ? ?Recommendations  ?Diet recommendations: Regular;Thin liquid ?Liquids provided via: Cup;Straw ?Medication Administration: Whole meds with liquid ?Supervision: Staff to assist with self feeding;Full supervision/cueing for compensatory strategies ?Compensations: Minimize environmental distractions;Slow rate;Small sips/bites ?Postural Changes and/or Swallow Maneuvers: Seated upright 90 degrees  ?   ?    ?   ? ? ? ? Oral Care Recommendations:  Oral care BID ?Follow Up Recommendations: No SLP follow up ?Assistance recommended at discharge: Intermittent Supervision/Assistance ?SLP Visit Diagnosis: Dysphagia, unspecified (R13.10) ?Plan: Discharge SLP treatment due to (comment);All goals met ? ? ? ? ?  ?  ? ?Ellwood Dense, MA, CCC-SLP ?Acute Rehabilitation Services ?Office Number: [phone] ? ?Tara Fields ? ?11/13/2021, 1:03 PM ?

## 2021-11-13 NOTE — TOC CM/SW Note (Addendum)
HF TOC CM spoke to son, Louie Casa. Explained [employer] will do her [place] and PT at [facility]. Pt may need oxygen or neb machine at dc. Left handoff for Weekend CM, and requested Unit RN check walking oxygen sat. Jonnie Finner RN3 CCM, Heart Failure TOC CM [phone]  ? ? ?HF TOC CM spoke to pt and offered choice for Outpatient Palliative Services. Pt agreeable to Manufacturing engineer for outpt palliative services. States to speak to son. Jonnie Finner RN3 CCM, Heart Failure TOC CM [phone]  ? ?HF TOC CM/CSW signed off, updated Unit TOC CM/CSW. Jonnie Finner RN3 CCM, Heart Failure TOC CM [phone]  ?

## 2021-11-13 NOTE — Progress Notes (Signed)
Patient ID: Tara Fields, female   DOB: 1928/06/16, 88 y.o.   MRN: [id number] ? ? ? ?Progress Note from the Palliative Medicine Team at [facility] ? ? ?Patient Name: Tara Fields        ?Date: 11/13/2021 ?DOB: June 04, 1928  Age: 88 y.o. MRN#: [id number] ?Attending Physician: Domenic Polite, MD ?Primary Care Physician: [name], NP ?Admit Date: 11/08/2021 ? ? ?Medical records reviewed  ? ?HPI: 88 y.o. female  with past medical history of CAD, s/p TAVR, COPD, HTN, HLD, afib on Eliquis, chronic systolic CHF EF 65-99%, CKD stage 3a, diabetes, cognitive impairment admitted on 11/08/2021 from independent living with shortness of breath with productive cough related to heart failure exacerbation. Has diuresed well. Hospitalization complicated by pneumonia, UTI, worsening afib, and increased agitation/confusion.   ? ? ?This NP visited patient at the bedside as a follow up for palliative medicine needs and emotional support. Patient is sitting on the side of the bed, she is calm and follows commands.  There is a Air cabin crew in the room.  ? ?Spoke with son/ Randy by phone. ? ?Ongoing education regarding current medical situation and anticipatory care needs.  He verbalizes understanding of the increasing care needs but hopes for  continued improvement and return to home.    ? ?Education offered on OP Community based palliative care services in the home.   ? ?Discussed with Louie Casa  the importance of continued conversation with family and the  medical providers regarding overall plan of care and treatment options,  ensuring decisions are within the context of the patients values and GOCs. ? ?Plan: ?- Severe arthritic pain: Tramadol and Tylenol every 8 hours.  ?- Agitation: Seroquel 25 qhs added by Dr. Broadus John.  ?- Bowel Regimen: Senokot daily.  ?- Hope for discharge home when stable   ? ?Patient is high risk to decompensate  ? ?Wadie Lessen NP  ?Palliative Medicine Team Team Phone # [phone] ?Pager [phone] ?  ?

## 2021-11-13 NOTE — Progress Notes (Signed)
Nutrition Follow-up ? ?DOCUMENTATION CODES:  ? ?Non-severe (moderate) malnutrition in context of chronic illness ? ?INTERVENTION:  ? ?Liberalize diet to regular due to malnutrition. ?Encourage good PO intake ?Continue Multivitamin w/ minerals daily ?Continue Ensure Enlive po BID, each supplement provides 350 kcal and 20 grams of protein. ? ?NUTRITION DIAGNOSIS:  ? ?Moderate Malnutrition related to chronic illness (CHF, COPD) as evidenced by moderate muscle depletion, moderate fat depletion. - Ongoing ? ?GOAL:  ? ?Patient will meet greater than or equal to 90% of their needs - Progressing ? ?MONITOR:  ? ?PO intake, Supplement acceptance, Labs, Weight trends ? ?REASON FOR ASSESSMENT:  ? ?Consult ?Other (Comment) (Nutritional Goals) ? ?ASSESSMENT:  ? ?88 y.o. female presented to the ED with shortness of breath. PMH includes CHF, COPD, T2DM, HTN, and GERD. Pt admitted with CHF exacerbation.  ? ?Per MD notes, pt with increased agitation and delirium at night, now requiring a family sitter. Ongoing Palliative care discussions.  ? ?Pt resting on bedside. Sitter present at time of visit. Pt again goes in and out of sleep during RD visit.  ? ?Pt reports that she has had some nausea, but no vomiting. Reports that she has been eating.  ?Per EMR, pt intake includes: ?3/28: Lunch 80%, Dinner 50% ?3/29: Breakfast 90%, Lunch 5%, Dinner 10% ?3/30: Breakfast 10% Lunch 20% ?3/31: Breakfast 75% ? ?Pt states that she has been drinking Ensure's. Sitter could not verify previous days.  ?RD observed unopened, cold Ensure on bedside table. Sitter stated RN just brought it in.  ? ?Medications reviewed and include: Colace, MVI, Senokot, Spironolactone, Torsemide ?Labs reviewed: BUN 44, Creatinine 1.30  ? ?Diet Order:   ?Diet Order   ? ?       ?  Diet regular Room service appropriate? Yes with Assist; Fluid consistency: Thin  Diet effective now       ?  ? ?  ?  ? ?  ? ? ?EDUCATION NEEDS:  ? ?Not appropriate for education at this time ? ?Skin:   Skin Assessment: Reviewed RN Assessment ? ?Last BM:  3/30 ? ?Height:  ?Ht Readings from Last 1 Encounters:  ?11/08/21 5' (1.524 m)  ? ?Weight:  ?Wt Readings from Last 1 Encounters:  ?11/13/21 64.4 kg  ? ?Ideal Body Weight:  45.5 kg ? ?BMI:  Body mass index is 27.73 kg/m?. ? ?Estimated Nutritional Needs:  ?Kcal:  2000-2200 ?Protein:  100-115 grams ?Fluid:  >/= 2 L ? ? ? ?Hermina Barters RD, LDN ?Clinical Dietitian ?See AMiON for contact information.  ? ?

## 2021-11-13 NOTE — Progress Notes (Signed)
?PROGRESS NOTE ? ? ? ?Tara Fields  LXB:[id number] DOB: 04-13-1928 DOA: 11/08/2021 ?PCP: [name], NP  ?Narrative [name] with history of CAD, chronic systolic CHF, s/p TAVR, COPD, type 2 diabetes mellitus, hypertension, paroxysmal atrial fibrillation presented to the ED with dyspnea, had recent URI and productive cough as well. ?-In the ED BNP was 1638, chest x-ray noted cardiomegaly and pulmonary vascular congestion,, repeat chest x-ray with additional opacities, concerning for pneumonia/atelectasis ?-Diuresed with IV Lasix and then subsequently started antibiotics for pneumonia ?-Hospital course complicated by agitation and delirium ? ? ?Subjective: ?-Extremely agitated last night, required multiple doses of Haldol, breathing improving, some cough ? ?Assessment & Plan: ? ? ?Acute hypoxic respiratory failure  ?Community-acquired pneumonia  ?-Remains on IV ceftriaxone day 5, change to oral cefdinir for 2 days ?-Wean off O2 ?-Incentive spirometry as tolerated, repeat chest x-ray is unchanged ?-Duo nebs, out of bed to chair, pulmonary toilet ?-SLP evaluation ruled out aspiration ?-[place], PT eval ?-Discharge planning, home with palliative care services ? ?Agitation, delirium ?-Likely hospital delirium in the setting of pneumonia, CHF ?-Labs unremarkable, ?-Continue p.m. dose of Seroquel, appears to be improving ? ?Acute on chronic systolic CHF (congestive heart failure) ([place]) ?Her last echocardiogram from 2022 had a reduced LV systolic function 25 to 41% with global hypokinesis. Preserved RV systolic function ?-Diuresed with IV Lasix, BNP was 1638 on admission ?-Clinically appears euvolemic, continue torsemide, Toprol, Aldactone ? ?Persistent atrial fibrillation ([place]) ?-With RVR, started on amiodarone, now p.o. ?-Continue Eliquis ? ?CKD (chronic kidney disease), stage III ([place]) ?CKD stage 3a, hypokalemia. ?-Stable, monitor ? ?Iron deficiency anemia ?-Hemoglobin relatively stable, given IV iron ? ?HTN  (hypertension) ?-Continue metoprolol, Aldactone ? ?Type 2 diabetes mellitus with hyperlipidemia ([place]) ?-CBGs are stable, continue sliding scale insulin ? ?Malnutrition of moderate degree ?Continue with nutritional supplementation.  ? ?Abnormal UA ?-Patient denies any symptoms of UTI, continue ABX for pneumonia ? ?DVT prophylaxis: Apixaban ?Code Status: DNR ?Family Communication: Discussed with patient and son at bedside ?Disposition Plan: Back to independent living in 1 to 2 days ? ?Consultants:  ?Cardiology ? ?Procedures:  ? ?Antimicrobials:  ? ? ?Objective: ?Vitals:  ? 11/13/21 0801 11/13/21 0915 11/13/21 1158 11/13/21 1239  ?BP: 110/60     ?Pulse: 87     ?Resp: (!) 21 20    ?Temp: 97.6 ?F (36.4 ?C)     ?TempSrc: Oral     ?SpO2: 95%  99% 98%  ?Weight:      ?Height:      ? ? ?Intake/Output Summary (Last 24 hours) at 11/13/2021 1400 ?Last data filed at 11/13/2021 1300 ?Gross per 24 hour  ?Intake 1080 ml  ?Output 375 ml  ?Net 705 ml  ? ?Filed Weights  ? 11/11/21 0100 11/12/21 0239 11/13/21 0500  ?Weight: 64.1 kg 63.4 kg 64.4 kg  ? ? ?Examination: ? ?General exam: Pleasant elderly female sitting up in bed, more awake and alert today, oriented to self and place, moderate cognitive deficits  ?HEENT: No JVD ?CVS: S1-S2, irregularly irregular rhythm, systolic murmur  ?Lungs: Few scattered rhonchi, otherwise clear ?Abdomen: Soft, nontender, bowel sounds present ?Extremities: No edema ?Skin: No rashes ?Psychiatry: Poor insight and judgment ? ?Data Reviewed:  ? ?CBC: ?Recent Labs  ?Lab 11/09/21 ?0351 11/10/21 ?0653 11/11/21 ?6384 11/12/21 ?5364 11/13/21 ?0321  ?WBC 7.6 7.0 7.4 6.1 5.8  ?HGB 10.9* 10.5* 10.5* 10.9* 10.2*  ?HCT 33.1* 33.3* 32.4* 32.7* 31.1*  ?MCV 93.0 94.1 92.0 91.6 90.4  ?PLT 203 207 230 222 217  ? ?  Basic Metabolic Panel: ?Recent Labs  ?Lab 11/08/21 ?6834 11/09/21 ?1962 11/10/21 ?2297 11/11/21 ?9892 11/12/21 ?1194 11/13/21 ?0321  ?NA 139 138 137 135 141 140  ?K 4.1 3.5 3.6 4.2 5.1 3.7  ?CL 103 104 100 102 104  102  ?CO2 22 22 28 22 26 25   ?GLUCOSE 182* 166* 162* 156* 167* 140*  ?BUN 38* 35* 35* 42* 44* 44*  ?CREATININE 1.17* 1.08* 1.07* 1.29* 1.35* 1.30*  ?CALCIUM 9.3 8.8* 8.9 8.4* 9.2 9.1  ?MG 2.2  --  2.1 2.1 2.3  --   ? ?GFR: ?Estimated Creatinine Clearance: 23.6 mL/min (A) (by C-G formula based on SCr of 1.3 mg/dL (H)). ?Liver Function Tests: ?Recent Labs  ?Lab 11/08/21 ?1740  ?AST 21  ?ALT 12  ?ALKPHOS 61  ?BILITOT 0.8  ?PROT 6.9  ?ALBUMIN 3.9  ? ?No results for input(s): LIPASE, AMYLASE in the last 168 hours. ?No results for input(s): AMMONIA in the last 168 hours. ?Coagulation Profile: ?No results for input(s): INR, PROTIME in the last 168 hours. ?Cardiac Enzymes: ?No results for input(s): CKTOTAL, CKMB, CKMBINDEX, TROPONINI in the last 168 hours. ?BNP (last 3 results) ?No results for input(s): PROBNP in the last 8760 hours. ?HbA1C: ?No results for input(s): HGBA1C in the last 72 hours. ?CBG: ?Recent Labs  ?Lab 11/08/21 ?1556  ?GLUCAP 120*  ? ?Lipid Profile: ?No results for input(s): CHOL, HDL, LDLCALC, TRIG, CHOLHDL, LDLDIRECT in the last 72 hours. ?Thyroid Function Tests: ?No results for input(s): TSH, T4TOTAL, FREET4, T3FREE, THYROIDAB in the last 72 hours. ?Anemia Panel: ?No results for input(s): VITAMINB12, FOLATE, FERRITIN, TIBC, IRON, RETICCTPCT in the last 72 hours. ? ?Urine analysis: ?   ?Component Value Date/Time  ? [place] YELLOW 11/09/2021 1535  ? APPEARANCEUR HAZY (A) 11/09/2021 1535  ? LABSPEC 1.012 11/09/2021 1535  ? PHURINE 6.0 11/09/2021 1535  ? [place] NEGATIVE 11/09/2021 1535  ? GLUCOSEU NEGATIVE 12/11/2019 1033  ? HGBUR SMALL (A) 11/09/2021 1535  ? [place] NEGATIVE 11/09/2021 1535  ? [place] NEGATIVE 11/09/2021 1535  ? [place] NEGATIVE 11/09/2021 1535  ? UROBILINOGEN 0.2 12/11/2019 1033  ? NITRITE NEGATIVE 11/09/2021 1535  ? LEUKOCYTESUR LARGE (A) 11/09/2021 1535  ? ?Sepsis Labs: ?@LABRCNTIP (procalcitonin:4,lacticidven:4) ? ?)No results found for this or any previous visit (from the  past 240 hour(s)).  ? ?Radiology Studies: ?DG Chest 2 View ? ?Result Date: 11/12/2021 ?CLINICAL DATA:  Shortness of breath. EXAM: CHEST - 2 VIEW COMPARISON:  November 10, 2021.  March 04, 2021. FINDINGS: Stable cardiomegaly. Status post transcatheter aortic valve repair. Stable interstitial densities are noted throughout both lungs which may represent edema or atypical inflammation. Stable left perihilar opacity is noted which may represent post radiation fibrosis. Minimal pleural effusions may be present. Bony thorax is unremarkable. IMPRESSION: Stable interstitial densities are noted throughout both lungs which may represent edema or atypical inflammation. Minimal pleural effusions may be present. Electronically Signed   By: Marijo Conception M.D.   On: 11/12/2021 09:49   ? ? ?Scheduled Meds: ? acetaminophen  1,000 mg Oral Q8H  ? apixaban  5 mg Oral BID  ? atorvastatin  40 mg Oral Daily  ? docusate sodium  100 mg Oral BID  ? feeding supplement  237 mL Oral BID BM  ? ipratropium-albuterol  3 mL Nebulization QID  ? mouth rinse  15 mL Mouth Rinse BID  ? metoprolol succinate  50 mg Oral BID  ? multivitamin with minerals  1 tablet Oral Daily  ? pneumococcal 20-valent conjugate vaccine  0.5  mL Intramuscular Once  ? QUEtiapine  25 mg Oral QPM  ? senna  1 tablet Oral Daily  ? sodium chloride flush  3 mL Intravenous Q12H  ? spironolactone  12.5 mg Oral Daily  ? terazosin  10 mg Oral QHS  ? torsemide  40 mg Oral Q breakfast  ? traMADol  50 mg Oral TID  ? ?Continuous Infusions: ? cefTRIAXone (ROCEPHIN)  IV 2 g (11/13/21 1220)  ? ? ? LOS: 5 days  ? ? ?Time spent: 54min ? ? ? ?Domenic Polite, MD ?[facility] ? ? ?11/13/2021, 2:00 PM  ?  ?

## 2021-11-13 NOTE — Progress Notes (Signed)
Occupational Therapy Treatment ?Patient Details ?Name: Tara Fields ?MRN: [id number] ?DOB: 1927-12-31 ?Today's Date: 11/13/2021 ? ? ?History of present illness Pt is a 88 y.o. female who presented 11/08/21 with SOB, and had a productive cough. Pt admitted with CHF exacerbation. PMH: chronic systolic CHF EF 60-10%, h/o TAVr, PAfib on eliquis, COPD, CAD, CKD 3a, aortic stenosis s/p TAVR ?  ?OT comments ? Patient continues to make steady progress towards goals in skilled OT session. Patient's session encompassed  lower body ADL tasks and sit<>stands. Patient with sitter, and now with significant improved mentation. Patient close min guard for lower body dressing, remains disoriented, but alert and responding appropraitely, remains unaware of safety but requiring signficantly less cues. OT continues to recommend short term SNF due to functional deficits outlined below. OT will continue to follow actuely.   ? ?Recommendations for follow up therapy are one component of a multi-disciplinary discharge planning process, led by the attending physician.  Recommendations may be updated based on patient status, additional functional criteria and insurance authorization. ?   ?Follow Up Recommendations ? Skilled nursing-short term rehab (<3 hours/day)  ?  ?Assistance Recommended at Discharge Frequent or constant Supervision/Assistance  ?Patient can return home with the following ? A little help with walking and/or transfers;A little help with bathing/dressing/bathroom;Assistance with cooking/housework;Direct supervision/assist for medications management;Direct supervision/assist for financial management;Assist for transportation;Help with stairs or ramp for entrance ?  ?Equipment Recommendations ? Other (comment) (Defer to next venue)  ?  ?Recommendations for Other Services   ? ?  ?Precautions / Restrictions Precautions ?Precautions: Fall ?Precaution Comments: Watch O2  ? ? ?  ? ?Mobility Bed Mobility ?  ?  ?  ?  ?  ?  ?   ?General bed mobility comments: up in chair upon arrival ?  ? ?Transfers ?Overall transfer level: Needs assistance ?Equipment used: Rolling walker (2 wheels) ?Transfers: Sit to/from Stand ?Sit to Stand: Min assist ?  ?  ?  ?  ?  ?  ?  ?  ?Balance Overall balance assessment: Mild deficits observed, not formally tested ?  ?  ?  ?  ?  ?  ?  ?  ?  ?  ?  ?  ?  ?  ?  ?  ?  ?  ?   ? ?ADL either performed or assessed with clinical judgement  ? ?ADL Overall ADL's : Needs assistance/impaired ?  ?  ?  ?  ?  ?  ?  ?  ?  ?  ?Lower Body Dressing: Min guard;Sit to/from stand ?Lower Body Dressing Details (indicate cue type and reason): able to manage underwear at close min gaurd level ?Toilet Transfer: Cueing for sequencing;Cueing for safety;Rolling walker (2 wheels);Minimal assistance ?  ?  ?  ?  ?  ?Functional mobility during ADLs: Minimal assistance;Rolling walker (2 wheels);Cueing for sequencing;Cueing for safety ?General ADL Comments: patient progressing with functional tasks ?  ? ?Extremity/Trunk Assessment   ?  ?  ?  ?  ?  ? ?Vision   ?  ?  ?Perception   ?  ?Praxis   ?  ? ?Cognition Arousal/Alertness: Awake/alert ?Behavior During Therapy: [facility] for tasks assessed/performed ?Overall Cognitive Status: Impaired/Different from baseline ?Area of Impairment: Orientation, Safety/judgement ?  ?  ?  ?  ?  ?  ?  ?  ?  ?  ?  ?  ?Safety/Judgement: Decreased awareness of safety, Decreased awareness of deficits ?  ?  ?General Comments: patient with significant improvement in mentation,  remains disoriented, but alert and responding appropraitely, remains unaware of safety but requiring signficantly less cues ?  ?  ?   ?Exercises   ? ?  ?Shoulder Instructions   ? ? ?  ?General Comments    ? ? ?Pertinent Vitals/ Pain       Pain Assessment ?Pain Assessment: Faces ?Faces Pain Scale: Hurts a little bit ?Pain Location: tailbone ?Pain Descriptors / Indicators: Discomfort ?Pain Intervention(s): Limited activity within patient's tolerance, Monitored  during session, Repositioned ? ?Home Living   ?  ?  ?  ?  ?  ?  ?  ?  ?  ?  ?  ?  ?  ?  ?  ?  ?  ?  ? ?  ?Prior Functioning/Environment    ?  ?  ?  ?   ? ?Frequency ? Min 2X/week  ? ? ? ? ?  ?Progress Toward Goals ? ?OT Goals(current goals can now be found in the care plan section) ? Progress towards OT goals: Progressing toward goals ? ?Acute Rehab OT Goals ?Patient Stated Goal: to feel better, but I doubt it ?OT Goal Formulation: With patient ?Time For Goal Achievement: 11/24/21 ?Potential to Achieve Goals: Fair  ?Plan Discharge plan remains appropriate   ? ?Co-evaluation ? ? ?   ?  ?  ?  ?  ? ?  ?AM-PAC OT "6 Clicks" Daily Activity     ?Outcome Measure ? ? Help from another person eating meals?: A Little ?  ?Help from another person toileting, which includes using toliet, bedpan, or urinal?: A Little ?Help from another person bathing (including washing, rinsing, drying)?: A Little ?Help from another person to put on and taking off regular upper body clothing?: A Little ?Help from another person to put on and taking off regular lower body clothing?: A Little ?6 Click Score: 15 ? ?  ?End of Session Equipment Utilized During Treatment: Rolling walker (2 wheels);Oxygen ? ?OT Visit Diagnosis: Unsteadiness on feet (R26.81);Other abnormalities of gait and mobility (R26.89);Other symptoms and signs involving cognitive function ?  ?Activity Tolerance Patient tolerated treatment well ?  ?Patient Left in chair;with call bell/phone within reach;with chair alarm set;with nursing/sitter in room ?  ?Nurse Communication Mobility status ?  ? ?   ? ?Time: 1031-5945 ?OT Time Calculation (min): 13 min ? ?Charges: OT General Charges ?$OT Visit: 1 Visit ?OT Treatments ?$Self Care/Home Management : 8-22 mins ? ?Corinne Ports E. Aleicia Kenagy, OTR/L ?Acute Rehabilitation Services ?[phone] ?[phone]  ? ?Corinne Ports Charlotte Brafford ?11/13/2021, 12:47 PM ?

## 2021-11-14 DIAGNOSIS — I5023 Acute on chronic systolic (congestive) heart failure: Secondary | ICD-10-CM | POA: Diagnosis not present

## 2021-11-14 LAB — CBC
HCT: 32.6 % — ABNORMAL LOW (ref 36.0–46.0)
Hemoglobin: 10.3 g/dL — ABNORMAL LOW (ref 12.0–15.0)
MCH: 29.4 pg (ref 26.0–34.0)
MCHC: 31.6 g/dL (ref 30.0–36.0)
MCV: 93.1 fL (ref 80.0–100.0)
Platelets: 240 10*3/uL (ref 150–400)
RBC: 3.5 MIL/uL — ABNORMAL LOW (ref 3.87–5.11)
RDW: 15.8 % — ABNORMAL HIGH (ref 11.5–15.5)
WBC: 6.1 10*3/uL (ref 4.0–10.5)
nRBC: 0 % (ref 0.0–0.2)

## 2021-11-14 LAB — BASIC METABOLIC PANEL
Anion gap: 10 (ref 5–15)
BUN: 44 mg/dL — ABNORMAL HIGH (ref 8–23)
CO2: 25 mmol/L (ref 22–32)
Calcium: 9.3 mg/dL (ref 8.9–10.3)
Chloride: 105 mmol/L (ref 98–111)
Creatinine, Ser: 1.1 mg/dL — ABNORMAL HIGH (ref 0.44–1.00)
GFR, Estimated: 47 mL/min — ABNORMAL LOW (ref >60)
Glucose, Bld: 118 mg/dL — ABNORMAL HIGH (ref 70–99)
Potassium: 4.5 mmol/L (ref 3.5–5.1)
Sodium: 140 mmol/L (ref 135–145)

## 2021-11-14 MED ORDER — CEFDINIR 300 MG PO CAPS
300.0000 mg | ORAL_CAPSULE | Freq: Two times a day (BID) | ORAL | Status: DC
  Administered 2021-11-14 – 2021-11-15 (×3): 300 mg via ORAL
  Filled 2021-11-14 (×3): qty 1

## 2021-11-14 NOTE — Progress Notes (Signed)
?PROGRESS NOTE ? ? ? ?Tara Fields  HEN:[id number] DOB: 1928/03/29 DOA: 11/08/2021 ?PCP: [name], NP  ?Narrative [name] with history of CAD, chronic systolic CHF, s/p TAVR, COPD, type 2 diabetes mellitus, hypertension, paroxysmal atrial fibrillation presented to the ED with dyspnea, had recent URI and productive cough as well. ?-In the ED BNP was 1638, chest x-ray noted cardiomegaly and pulmonary vascular congestion,, repeat chest x-ray with additional opacities, concerning for pneumonia/atelectasis ?-Diuresed with IV Lasix and then subsequently started antibiotics for pneumonia ?-Hospital course complicated by agitation and delirium ? ? ?Subjective: ?-Much more appropriate and calm today ? ?Assessment & Plan: ? ? ?Acute hypoxic respiratory failure  ?Community-acquired pneumonia  ?-Treated with IV ceftriaxone, switch to oral cefdinir ?-Attempt to wean off O2 ?-Incentive spirometry, repeat chest x-ray is unchanged ?-Duo nebs, out of bed to chair, pulmonary toilet ?-SLP evaluation ruled out aspiration ?-Increase activity ?-Discharge planning, home with palliative care services tomorrow if stable ? ?Agitation, delirium ?-Likely hospital delirium in the setting of pneumonia, CHF ?-Labs unremarkable, ?-Continue p.m. dose of Seroquel,  ?-Finally improving ? ?Acute on chronic systolic CHF (congestive heart failure) ([place]) ?Her last echocardiogram from 2022 had a reduced LV systolic function 25 to 36% with global hypokinesis. Preserved RV systolic function ?-Diuresed with IV Lasix, BNP was 1638 on admission ?-Clinically appears euvolemic, continue torsemide, Toprol, Aldactone ? ?Persistent atrial fibrillation ([place]) ?-With RVR, started on amiodarone, now p.o. ?-Continue Eliquis ? ?CKD (chronic kidney disease), stage III ([place]) ?CKD stage 3a, hypokalemia. ?-Stable, monitor ? ?Iron deficiency anemia ?-Hemoglobin relatively stable, given IV iron ? ?HTN (hypertension) ?-Continue metoprolol, Aldactone ? ?Type 2 diabetes  mellitus with hyperlipidemia ([place]) ?-CBGs are stable, continue sliding scale insulin ? ?Malnutrition of moderate degree ?Continue with nutritional supplementation.  ? ?Abnormal UA ?-Patient denies any symptoms of UTI, continue ABX for pneumonia ? ?DVT prophylaxis: Apixaban ?Code Status: DNR ?Family Communication: Discussed with son yesterday ?Disposition Plan: Back to independent living tomorrow ? ?Consultants:  ?Cardiology ? ?Procedures:  ? ? ? ?Objective: ?Vitals:  ? 11/13/21 2025 11/13/21 2100 11/14/21 1443 11/14/21 0844  ?BP:  120/71  (!) 116/55  ?Pulse:  74 79 75  ?Resp:  20 17 18   ?Temp:  98.1 ?F (36.7 ?C)  (!) 97.5 ?F (36.4 ?C)  ?TempSrc:  Oral  Oral  ?SpO2: 97% 96% 95% 95%  ?Weight:      ?Height:      ? ? ?Intake/Output Summary (Last 24 hours) at 11/14/2021 1124 ?Last data filed at 11/14/2021 0846 ?Gross per 24 hour  ?Intake 1140 ml  ?Output 751 ml  ?Net 389 ml  ? ?Filed Weights  ? 11/11/21 0100 11/12/21 0239 11/13/21 0500  ?Weight: 64.1 kg 63.4 kg 64.4 kg  ? ? ?Examination: ? ?General exam: Pleasant elderly female sitting up in recliner, more awake and alert, oriented to self and place, mild cognitive deficits ?HEENT: No JVD ?CVS: S1-S2, irregularly irregular rhythm, systolic murmur ?Lungs: Improved air movement, rare scattered rhonchi, otherwise clear ?Abdomen: Soft, nontender, bowel sounds present ?Extremities: No edema ?Skin: No rashes ?Psychiatry: Poor insight and judgment ? ?Data Reviewed:  ? ?CBC: ?Recent Labs  ?Lab 11/10/21 ?0653 11/11/21 ?1847 11/12/21 ?0603 11/13/21 ?0321 11/14/21 ?1540  ?WBC 7.0 7.4 6.1 5.8 6.1  ?HGB 10.5* 10.5* 10.9* 10.2* 10.3*  ?HCT 33.3* 32.4* 32.7* 31.1* 32.6*  ?MCV 94.1 92.0 91.6 90.4 93.1  ?PLT 207 230 222 217 240  ? ?Basic Metabolic Panel: ?Recent Labs  ?Lab 11/08/21 ?0867 11/09/21 ?6195 11/10/21 ?0932 11/11/21 ?0409 11/12/21 ?  6568 11/13/21 ?0321 11/14/21 ?1275  ?NA 139   < > 137 135 141 140 140  ?K 4.1   < > 3.6 4.2 5.1 3.7 4.5  ?CL 103   < > 100 102 104 102 105  ?CO2 22   <  > 28 22 26 25 25   ?GLUCOSE 182*   < > 162* 156* 167* 140* 118*  ?BUN 38*   < > 35* 42* 44* 44* 44*  ?CREATININE 1.17*   < > 1.07* 1.29* 1.35* 1.30* 1.10*  ?CALCIUM 9.3   < > 8.9 8.4* 9.2 9.1 9.3  ?MG 2.2  --  2.1 2.1 2.3  --   --   ? < > = values in this interval not displayed.  ? ?GFR: ?Estimated Creatinine Clearance: 27.9 mL/min (A) (by C-G formula based on SCr of 1.1 mg/dL (H)). ?Liver Function Tests: ?Recent Labs  ?Lab 11/08/21 ?1700  ?AST 21  ?ALT 12  ?ALKPHOS 61  ?BILITOT 0.8  ?PROT 6.9  ?ALBUMIN 3.9  ? ?No results for input(s): LIPASE, AMYLASE in the last 168 hours. ?No results for input(s): AMMONIA in the last 168 hours. ?Coagulation Profile: ?No results for input(s): INR, PROTIME in the last 168 hours. ?Cardiac Enzymes: ?No results for input(s): CKTOTAL, CKMB, CKMBINDEX, TROPONINI in the last 168 hours. ?BNP (last 3 results) ?No results for input(s): PROBNP in the last 8760 hours. ?HbA1C: ?No results for input(s): HGBA1C in the last 72 hours. ?CBG: ?Recent Labs  ?Lab 11/08/21 ?1556  ?GLUCAP 120*  ? ?Lipid Profile: ?No results for input(s): CHOL, HDL, LDLCALC, TRIG, CHOLHDL, LDLDIRECT in the last 72 hours. ?Thyroid Function Tests: ?No results for input(s): TSH, T4TOTAL, FREET4, T3FREE, THYROIDAB in the last 72 hours. ?Anemia Panel: ?No results for input(s): VITAMINB12, FOLATE, FERRITIN, TIBC, IRON, RETICCTPCT in the last 72 hours. ? ?Urine analysis: ?   ?Component Value Date/Time  ? [place] YELLOW 11/09/2021 1535  ? APPEARANCEUR HAZY (A) 11/09/2021 1535  ? LABSPEC 1.012 11/09/2021 1535  ? PHURINE 6.0 11/09/2021 1535  ? [place] NEGATIVE 11/09/2021 1535  ? GLUCOSEU NEGATIVE 12/11/2019 1033  ? HGBUR SMALL (A) 11/09/2021 1535  ? [place] NEGATIVE 11/09/2021 1535  ? [place] NEGATIVE 11/09/2021 1535  ? [place] NEGATIVE 11/09/2021 1535  ? UROBILINOGEN 0.2 12/11/2019 1033  ? NITRITE NEGATIVE 11/09/2021 1535  ? LEUKOCYTESUR LARGE (A) 11/09/2021 1535  ? ?Sepsis  Labs: ?@LABRCNTIP (procalcitonin:4,lacticidven:4) ? ?)No results found for this or any previous visit (from the past 240 hour(s)).  ? ?Radiology Studies: ?No results found. ? ? ?Scheduled Meds: ? acetaminophen  1,000 mg Oral Q8H  ? apixaban  5 mg Oral BID  ? atorvastatin  40 mg Oral Daily  ? cefdinir  300 mg Oral Q12H  ? docusate sodium  100 mg Oral BID  ? feeding supplement  237 mL Oral BID BM  ? ipratropium-albuterol  3 mL Nebulization QID  ? mouth rinse  15 mL Mouth Rinse BID  ? metoprolol succinate  50 mg Oral BID  ? multivitamin with minerals  1 tablet Oral Daily  ? pneumococcal 20-valent conjugate vaccine  0.5 mL Intramuscular Once  ? QUEtiapine  25 mg Oral QPM  ? senna  1 tablet Oral Daily  ? sodium chloride flush  3 mL Intravenous Q12H  ? spironolactone  12.5 mg Oral Daily  ? terazosin  10 mg Oral QHS  ? torsemide  40 mg Oral Q breakfast  ? traMADol  50 mg Oral TID  ? ?Continuous Infusions: ? ? ? ?  LOS: 6 days  ? ? ?Time spent: 39min ? ?Domenic Polite, MD ?[facility] ? ? ?11/14/2021, 11:24 AM  ?  ?

## 2021-11-15 DIAGNOSIS — I5023 Acute on chronic systolic (congestive) heart failure: Secondary | ICD-10-CM | POA: Diagnosis not present

## 2021-11-15 LAB — BASIC METABOLIC PANEL
Anion gap: 11 (ref 5–15)
BUN: 41 mg/dL — ABNORMAL HIGH (ref 8–23)
CO2: 26 mmol/L (ref 22–32)
Calcium: 8.9 mg/dL (ref 8.9–10.3)
Chloride: 100 mmol/L (ref 98–111)
Creatinine, Ser: 1.1 mg/dL — ABNORMAL HIGH (ref 0.44–1.00)
GFR, Estimated: 47 mL/min — ABNORMAL LOW (ref >60)
Glucose, Bld: 130 mg/dL — ABNORMAL HIGH (ref 70–99)
Potassium: 4.6 mmol/L (ref 3.5–5.1)
Sodium: 137 mmol/L (ref 135–145)

## 2021-11-15 MED ORDER — METOPROLOL SUCCINATE ER 50 MG PO TB24
50.0000 mg | ORAL_TABLET | Freq: Two times a day (BID) | ORAL | 0 refills | Status: DC
Start: 2021-11-15 — End: 2021-12-31

## 2021-11-15 MED ORDER — QUETIAPINE FUMARATE 25 MG PO TABS: 12.5000 mg | ORAL_TABLET | Freq: Every evening | ORAL | 0 refills | Status: DC

## 2021-11-15 MED ORDER — SPIRONOLACTONE 25 MG PO TABS: 12.5000 mg | ORAL_TABLET | Freq: Every day | ORAL | 0 refills | Status: DC

## 2021-11-15 MED ORDER — METOPROLOL SUCCINATE ER 50 MG PO TB24: 50.0000 mg | ORAL_TABLET | Freq: Every day | ORAL | 0 refills | Status: DC

## 2021-11-15 NOTE — Progress Notes (Signed)
Patient's O2 sats dropped to low 80's without oxygen while sleeping. O2 sats remained 95 to 100% with 2L [place].  ?

## 2021-11-15 NOTE — Congregational Nurse Program (Signed)
SATURATION QUALIFICATIONS: (This note is used to comply with regulatory documentation for home oxygen) ? ?Patient Saturations on Room Air at Rest = 93% ? ?Patient Saturations on Room Air while Ambulating = 86% ? ?Patient Saturations on 2 Liters of oxygen while Ambulating = 91% ? ?Please briefly explain why patient needs home oxygen: ?

## 2021-11-15 NOTE — TOC Transition Note (Signed)
Transition of Care (TOC) - CM/SW Discharge Note ? ? ?Patient Details  ?Name: Tara Fields ?MRN: [id number] ?Date of Birth: 1927/08/25 ? ?Transition of Care (TOC) CM/SW Contact:  ?Carles Collet, RN ?Phone Number: ?11/15/2021, 12:24 PM ? ? ?Clinical Narrative:    ?Notified [facility] that patient will DC home today.  ?Ordered home oxygen through [employer] to be delivered to the room prior to DC. ?Reviewed plan with son Louie Casa over the phone and he confirmed understanding and will be able to provide transportation home. Discussed patient's readmission rate and suggested that she be evaluated to progress into ALF from ILF and resourced Medicare website for ratings on ALFs.  ? ? ? ?Final next level of care: [place] ?Barriers to Discharge: No Barriers Identified ? ? ?Patient Goals and CMS Choice ?Patient states their goals for this hospitalization and ongoing recovery are:: return to [facility] ?CMS Medicare.gov Compare Post Acute Care list provided to:: Patient Represenative (must comment) ?Choice offered to / list presented to : Adult Children ? ?Discharge Placement ?  ?           ?  ?  ?  ?  ? ?Discharge Plan and Services ?In-house Referral: NA ?Discharge Planning Services: CM Consult ?Post Acute Care Choice: Home Health          ?DME Arranged: Oxygen ?DME Agency: [employer] ?Date DME Agency Contacted: 11/15/21 ?Time DME Agency Contacted: [phone] ?Representative spoke with at DME Agency: Delana Meyer ?HH Arranged: Therapist, sports, Nurse's Aide ?[place] Agency: [place] ?Date HH Agency Contacted: 11/15/21 ?Time [place]: [phone] ?Representative spoke with at [place]: Corene Cornea ? ?Social Determinants of Health (SDOH) Interventions ?  ? ? ?Readmission Risk Interventions ? ?  11/09/2021  ?  2:38 PM 09/04/2021  ?  2:20 PM 06/08/2021  ? 11:39 AM  ?Readmission Risk Prevention Plan  ?Transportation Screening Complete Complete Complete  ?PCP or Specialist Appt within 3-5 Days  Complete   ?[place] or Home Care Consult   Complete   ?Social Work Consult for [place] Planning/Counseling  Complete   ?Palliative Care Screening  Not Applicable   ?Medication Review Press photographer) Complete Complete Complete  ?PCP or Specialist appointment within 3-5 days of discharge Complete    ?[place] or Home Care Consult Complete  Complete  ?SW Recovery Care/Counseling Consult Complete  Complete  ?Palliative Care Screening Not Applicable  Not Applicable  ?[place] Not Applicable  Not Applicable  ? ? ? ? ? ?

## 2021-11-15 NOTE — Discharge Summary (Signed)
Physician Discharge Summary  ?Tara Fields TMH:[id number] DOB: 1928-08-08 DOA: 11/08/2021 ? ?PCP: Tara Peng, NP ? ?Admit date: 11/08/2021 ?Discharge date: 11/15/2021 ? ?Time spent: 35 minutes ? ?Recommendations for Outpatient Follow-up:  ?Heart failure team in 2 weeks ?Home palliative care follow-up, transition to hospice as she declines further ?Home health PT OT ? ?Discharge Diagnoses:  ?Principal Problem: ?  Acute on chronic systolic CHF (congestive heart failure) ([place]) ?Delirium ?Moderate cognitive deficits ?  Persistent atrial fibrillation (Tara Fields) ?  CKD (chronic kidney disease), stage III ([place]) ?  HTN (hypertension) ?  Type 2 diabetes mellitus with hyperlipidemia ([place]) ?  Cognitive deficits ?  Malnutrition of moderate degree ?  UTI (urinary tract infection) ?  DNR (do not resuscitate) ? ? ?Discharge Condition: Stable ? ?Diet recommendation: Low-sodium, heart healthy ? ?Filed Weights  ? 11/11/21 0100 11/12/21 0239 11/13/21 0500  ?Weight: 64.1 kg 63.4 kg 64.4 kg  ? ? ?History of present illness:  ? [name] with history of CAD, chronic systolic CHF, s/p TAVR, COPD, type 2 diabetes mellitus, hypertension, paroxysmal atrial fibrillation presented to the ED with dyspnea, had recent URI and productive cough as well. ?-In the ED BNP was 1638, chest x-ray noted cardiomegaly and pulmonary vascular congestion,, repeat chest x-ray with additional opacities, concerning for pneumonia/atelectasis ?-Diuresed with IV Lasix and then subsequently started antibiotics for pneumonia ?-Hospital course complicated by agitation and delirium ? ?Hospital Course:  ? ?Acute hypoxic respiratory failure  ?Community-acquired pneumonia  ?-Treated with IV ceftriaxone, switched to oral cefdinir ?-Treated with incentive spirometry, DuoNebs as well ?-SLP evaluation ruled out aspiration ?-Completed antibiotic course ?-Discharged home with palliative care services, will need transition to hospice when she declines further ?  ?Agitation,  delirium ?-hospital delirium in the setting of pneumonia, CHF ?-Labs unremarkable, ?-Started on low-dose Seroquel every evening ?  ?Acute on chronic systolic CHF (congestive heart failure) (Tara Fields) ?Her last echocardiogram from 2022 had a reduced LV systolic function 25 to 92% with global hypokinesis. Preserved RV systolic function ?-Diuresed with IV Lasix, BNP was 1638 on admission ?-Clinically appears euvolemic, continue torsemide, Toprol, Aldactone ?-Follow-up with heart failure team ?  ?Persistent atrial fibrillation (Tara Fields) ?-With RVR, started on amiodarone, then discontinued ?-Continue Eliquis ?-Continue Toprol 50 Mg twice daily ?  ?CKD (chronic kidney disease), stage III (Tara Fields) ?CKD stage 3a, hypokalemia. ?-Stable, monitor ?  ?Iron deficiency anemia ?-Hemoglobin relatively stable, given IV iron ?  ?HTN (hypertension) ?-Continue metoprolol, Aldactone ?  ?Type 2 diabetes mellitus with hyperlipidemia (Tara Fields) ?-CBGs are stable, diet controlled ?  ?Malnutrition of moderate degree ?Continue with nutritional supplementation.  ?  ?Abnormal UA ?-Patient denies any symptoms of UTI ? ?Code Status: DNR ? ?Discharge Exam: ?Vitals:  ? 11/15/21 0744 11/15/21 1103  ?BP:  127/77  ?Pulse:  74  ?Resp:  20  ?Temp:  (!) 97.4 ?F (36.3 ?C)  ?SpO2: 95% 93%  ? ?General exam: Pleasant elderly female sitting up in recliner, more awake and alert, oriented to self and place, mild cognitive deficits ?HEENT: No JVD ?CVS: S1-S2, irregularly irregular rhythm, systolic murmur ?Lungs: Improved air movement, rare scattered rhonchi, otherwise clear ?Abdomen: Soft, nontender, bowel sounds present ?Extremities: No edema ?Skin: No rashes ?Psychiatry: Poor insight and judgment ? ? ?Discharge Instructions ? ? ?Discharge Instructions   ? ? Diet - low sodium heart healthy   Complete by: As directed ?  ? Increase activity slowly   Complete by: As directed ?  ? ?  ? ?Allergies as of 11/15/2021   ? ?  Reactions  ? Amlodipine Besy-benazepril Hcl Cough  ? Hctz  [hydrochlorothiazide] Other (See Comments)  ? Per family, every diuretic the patient has been on flushes out her out and ultimately results in CRAMPING/JAUNDICE (added potassium might help)  ? Other Other (See Comments)  ? History of diverticulitis- CANNOT TOLERATE NUTS, CORN, AND ANY FOODS NOT EASILY DIGESTED- the patient AVOIDS these  ? Cyclobenzaprine Other (See Comments)  ? Confusion- does not tolerate well   ? Diclofenac Sodium Diarrhea  ? Hydralazine Other (See Comments)  ? Per family, every diuretic the patient has been on flushes out her out and ultimately results in CRAMPING/JAUNDICE (added potassium might help)  ? Methylprednisolone Other (See Comments)  ? Medrol/CORTICOSTEROIDS = A LOT OF ENERGY  ? Oxycodone Other (See Comments)  ? Patient becomes very disorientated   ? ?  ? ?  ?Medication List  ?  ? ?STOP taking these medications   ? ?losartan 25 MG tablet ?Commonly known as: COZAAR ?  ? ?  ? ?TAKE these medications   ? ?acetaminophen 500 MG tablet ?Commonly known as: TYLENOL ?Take 500 mg by mouth 3 (three) times daily as needed for moderate pain. Take with Tramadol ?  ?atorvastatin 40 MG tablet ?Commonly known as: LIPITOR ?TAKE 1 TABLET BY MOUTH DAILY AT 6 PM. ?What changed: when to take this ?  ?cholecalciferol 25 MCG (1000 UNIT) tablet ?Commonly known as: VITAMIN D3 ?Take 1,000 Units by mouth 2 (two) times daily. ?  ?Co Q-10 300 MG Caps ?Take 300 mg by mouth in the morning. ?  ?CVS Aspirin Adult Low Dose 81 MG chewable tablet ?Generic drug: aspirin ?CHEW 1 TABLET (81 MG TOTAL) BY MOUTH DAILY. ?What changed: how much to take ?  ?Eliquis 5 MG Tabs tablet ?Generic drug: apixaban ?TAKE 1 TABLET BY MOUTH TWICE A DAY ?What changed: how much to take ?  ?Ferrous Gluconate 324 (37.5 Fe) MG Tabs ?Take 1 tablet (324 mg total) by mouth daily with breakfast. ?  ?Lidocaine-Menthol (Spray) 4-1 % Liqd ?Apply 1 spray topically daily as needed (back/knee pain/restless legs). ?  ?Magnesium 200 MG Tabs ?Take 200 mg by  mouth every evening. ?  ?metoprolol succinate 50 MG 24 hr tablet ?Commonly known as: TOPROL-XL ?Take 1 tablet (50 mg total) by mouth 2 (two) times daily. Take with or immediately following a meal. ?What changed:  ?medication strength ?how much to take ?when to take this ?  ?multivitamin with minerals Tabs tablet ?Take 1 tablet by mouth 2 (two) times daily. Centrum Minis ?  ?potassium chloride 20 MEQ/15ML (10%) Soln ?Take 15 mLs (20 mEq total) by mouth daily. ?  ?psyllium 58.6 % powder ?Commonly known as: METAMUCIL ?Take 1 packet by mouth daily as needed (for constipation). ?  ?QUEtiapine 25 MG tablet ?Commonly known as: SEROQUEL ?Take 0.5 tablets (12.5 mg total) by mouth every evening. ?  ?spironolactone 25 MG tablet ?Commonly known as: ALDACTONE ?Take 0.5 tablets (12.5 mg total) by mouth daily. ?Start taking on: November 16, 2021 ?  ?terazosin 10 MG capsule ?Commonly known as: HYTRIN ?TAKE 1 CAPSULE BY MOUTH EVERY DAY AT 12 NOON ?What changed: See the new instructions. ?  ?torsemide 20 MG tablet ?Commonly known as: DEMADEX ?Take 2 tablets (40 mg total) by mouth daily. ?  ?traMADol 50 MG tablet ?Commonly known as: ULTRAM ?TAKE 1 TABLET (50 MG TOTAL) BY MOUTH IN THE MORNING, AT NOON, AND AT BEDTIME. ?What changed: See the new instructions. ?  ?vitamin B-12 1000 MCG tablet ?  Commonly known as: CYANOCOBALAMIN ?Take 1 tablet (1,000 mcg total) by mouth daily. ?  ? ?  ? ?Allergies  ?Allergen Reactions  ? Amlodipine Besy-Benazepril Hcl Cough  ? Hctz [Hydrochlorothiazide] Other (See Comments)  ?  Per family, every diuretic the patient has been on flushes out her out and ultimately results in CRAMPING/JAUNDICE (added potassium might help)  ? Other Other (See Comments)  ?  History of diverticulitis- CANNOT TOLERATE NUTS, CORN, AND ANY FOODS NOT EASILY DIGESTED- the patient AVOIDS these  ? Cyclobenzaprine Other (See Comments)  ?  Confusion- does not tolerate well   ? Diclofenac Sodium Diarrhea  ? Hydralazine Other (See Comments)  ?   Per family, every diuretic the patient has been on flushes out her out and ultimately results in CRAMPING/JAUNDICE (added potassium might help)  ? Methylprednisolone Other (See Comments)  ?  Medrol/CORTICOSTEROIDS = A

## 2021-11-15 NOTE — Progress Notes (Signed)
Patient discharged to assisted living. IV and telemetry removed. Discharge instructions explained and given to son, Louie Casa. ?

## 2021-11-15 NOTE — Plan of Care (Signed)
?  Problem: Education: ?Goal: Knowledge of General Education information will improve ?Description: Including pain rating scale, medication(s)/side effects and non-pharmacologic comfort measures ?Outcome: Adequate for Discharge ?  ?Problem: Health Behavior/Discharge Planning: ?Goal: Ability to manage health-related needs will improve ?Outcome: Adequate for Discharge ?  ?Problem: Clinical Measurements: ?Goal: Ability to maintain clinical measurements within normal limits will improve ?Outcome: Adequate for Discharge ?  ?Problem: Clinical Measurements: ?Goal: Will remain free from infection ?Outcome: Adequate for Discharge ?  ?Problem: Clinical Measurements: ?Goal: Diagnostic test results will improve ?Outcome: Adequate for Discharge ?  ?Problem: Clinical Measurements: ?Goal: Respiratory complications will improve ?Outcome: Adequate for Discharge ?  ?Problem: Clinical Measurements: ?Goal: Cardiovascular complication will be avoided ?Outcome: Adequate for Discharge ?  ?Problem: Activity: ?Goal: Risk for activity intolerance will decrease ?Outcome: Adequate for Discharge ?  ?Problem: Nutrition: ?Goal: Adequate nutrition will be maintained ?Outcome: Adequate for Discharge ?  ?Problem: Coping: ?Goal: Level of anxiety will decrease ?Outcome: Adequate for Discharge ?  ?Problem: Elimination: ?Goal: Will not experience complications related to bowel motility ?Outcome: Adequate for Discharge ?  ?Problem: Elimination: ?Goal: Will not experience complications related to urinary retention ?Outcome: Adequate for Discharge ?  ?Problem: Pain Managment: ?Goal: General experience of comfort will improve ?Outcome: Adequate for Discharge ?  ?Problem: Safety: ?Goal: Ability to remain free from injury will improve ?Outcome: Adequate for Discharge ?  ?Problem: Skin Integrity: ?Goal: Risk for impaired skin integrity will decrease ?Outcome: Adequate for Discharge ?  ?Problem: Education: ?Goal: Ability to demonstrate management of disease  process will improve ?Outcome: Adequate for Discharge ?  ?Problem: Education: ?Goal: Ability to verbalize understanding of medication therapies will improve ?Outcome: Adequate for Discharge ?  ?Problem: Education: ?Goal: Individualized Educational Video(s) ?Outcome: Adequate for Discharge ?  ?Problem: Activity: ?Goal: Capacity to carry out activities will improve ?Outcome: Adequate for Discharge ?  ?Problem: Cardiac: ?Goal: Ability to achieve and maintain adequate cardiopulmonary perfusion will improve ?Outcome: Adequate for Discharge ?  ?

## 2021-11-19 ENCOUNTER — Telehealth: Payer: Self-pay

## 2021-11-19 NOTE — Telephone Encounter (Signed)
Spoke with patient's son Louie Casa regarding Palliative Care services. Louie Casa declined services at this time. Referral canceled.  ?

## 2021-11-20 ENCOUNTER — Telehealth: Payer: Self-pay

## 2021-11-20 NOTE — Telephone Encounter (Signed)
Patient's son Louie Casa called back and scheduled a mychart Palliative Consult for 11/23/21 @ 2:30 PM.  ? ?Consent obtained; updated Netsmart, Team List and Epic.  ? ?

## 2021-11-20 NOTE — Telephone Encounter (Signed)
Called pt son [facility] per [facility]) reviewed Providers recommendations.  Son is having trouble cutting pill in 1/2.  Advised to buy a new pill splitter says he has.  Then advised to have pharmacy cut pills in half reports will do this at next pick up.  Son had no further questions or concerns.  ?

## 2021-11-23 ENCOUNTER — Telehealth: Payer: Medicare Other | Admitting: Family Medicine

## 2021-11-23 ENCOUNTER — Encounter: Payer: Self-pay | Admitting: Family Medicine

## 2021-11-23 DIAGNOSIS — I5022 Chronic systolic (congestive) heart failure: Secondary | ICD-10-CM

## 2021-11-23 DIAGNOSIS — Z515 Encounter for palliative care: Secondary | ICD-10-CM

## 2021-11-23 NOTE — Progress Notes (Signed)
? ? ?Manufacturing engineer ?Community Palliative [facility] Consult Note ?Telephone: [phone]  ?Fax: [phone]  ? ?Date of encounter: 11/23/21 ?2:50 PM ?PATIENT NAME: Tara Fields ?[address] ?[place] [address]   ?[phone] (home)  ?DOB: [birth year] ?MRN: [id number] ?PRIMARY CARE PROVIDER:    ?[name], NP,  ?[place] ?[place] [address] ?[phone] ? ?REFERRING PROVIDER:   ?[name], NP ?[address] ?[place],  [place] [address] ?[phone] ? ?RESPONSIBLE PARTY:    ?Contact Information   ? ? Name Relation Home Work Mobile  ? Tara Fields Son [phone]    ? ?  ? ?I connected with  Tara Fields on 11/23/21 by a video enabled telemedicine application and verified that I am speaking with the correct person using two identifiers. ?  ?I discussed the limitations of evaluation and management by telemedicine. The patient expressed understanding and agreed to proceed.  ? ?Palliative Care was asked to follow this patient by consultation request of  Nafziger, Tommi Rumps, NP to address advance care planning and complex medical decision making. This is the initial visit.  ? ? ?      ASSESSMENT, SYMPTOM MANAGEMENT AND PLAN / RECOMMENDATIONS:  ? Chronic systolic heart failure ?EF reduced at 35-40 with aortic stenosis ?Agree with Aldactone, Metoprolol BID, Demadex. Intolerant of ACE-I in past. ?Encourage compliance with medication regimen. ?Daily weights with need for fluid and salt restriction. ? ?2.    Palliative Care Encounter ?Caregiver reports hospital delirium when inpatient ?Will address goals of care on in-person follow up ? ? ?Follow up Palliative Care Visit: Palliative care will continue to follow for complex medical decision making, advance care planning, and clarification of goals. Return 2 weeks or prn. ? ? ? ?This visit was coded based on medical decision making (MDM). ? ?PPS: 60% ? ?HOSPICE ELIGIBILITY/DIAGNOSIS: TBD ? ?Chief Complaint:   ?[facility] received a referral to follow up with patient for chronic disease management with recurrent CHF exacerbations and hospitalizations.  Follow up is also for advance directive planning and defining/refining goals of care.  ? ?HISTORY OF PRESENT ILLNESS:  Tara Fields is a 88 y.o. year old female with recent CHF exacerbation, UTI and pneumonia.  Has had 5 hospitalizations last year and 2 so far this year. Has CAD, aortic stenosis s/p  TAVR of Sapien valve July 2020, bilateral carotid artery stensosis, HTN, persistent AFIB, chronic systolic heart failure, COPD, pulmonary nodule, hx of GI Bleed, GERD, Type 2 DM, multiple joint OA, CKD stage 3, anemia, hx of Covid 19 infection in November, leg cramps and moderate protein calorie malnutrition.  P t denies difficulty with PND, has had orthopnea but not changed.  Weight 139 lb today. Has some edema in her feet and this last admission was sent home with Oxygen.  Uses a walker.  No falls. No chest pain, pain when she has it is located at "the bottom of the sternum", c/o stomach pain. Nausea this last time. Able to dress/bathe herself.  Incontinence of bladder, no bowel incontinence. Some constipation but was on Morphine.  Takes Tramadol for arthritis.  Takes Miralax daily but is fluid restricted. Is on Osteo-biflex with turmeric. ? ?History obtained from review of EMR, interview with family and/or Tara Fields.  ?I reviewed available labs, medications, imaging, studies and related documents from the EMR.  Records reviewed and summarized above.  ? ?ROS ?General: NAD ?EYES: denies vision changes ?ENMT: denies dysphagia ?Cardiovascular: denies chest pain, denies DOE ?Pulmonary: denies cough, denies  increased SOB ?Abdomen: endorses good appetite, endorses continence of bowel and recent constipation with use of Morphine ?GU: denies dysuria, endorses incontinence of urine ?MSK:  denies increased weakness, no falls reported ?Skin:  denies rashes or wounds ?Neurological: denies pain, denies insomnia ?Psych: Endorses positive mood ?Heme/lymph/immuno: denies bruises, abnormal bleeding ? ?Physical Exam: limited to visualization only ?Current and past weights: 139 lbs home scale today, 143.2 on 05/08/21 ?Constitutional: NAD ?General: frail appearing  ?EYES: anicteric sclera, lids intact, no discharge  ?ENMT: slightly hard of hearing ?CV: no visible cyanosis or dyspnea. ?Pulmonary: Resps appear even and unlabored on room air ?Psych: non-anxious affect, A and O x 3 ?Hem/lymph/immuno: no widespread bruising ? ?CURRENT PROBLEM LIST:  ?Patient Active Problem List  ? Diagnosis Date Noted  ? UTI (urinary tract infection) 11/10/2021  ? Malnutrition of moderate degree 11/09/2021  ? Type 2 diabetes mellitus with hyperlipidemia ([place]) 11/08/2021  ? DNR (do not resuscitate) 11/08/2021  ? Leg cramps 09/03/2021  ? Atrial fibrillation with rapid ventricular response ([place]) 09/02/2021  ? Elevated troponin 09/02/2021  ? Lab test positive for detection of COVID-19 virus   ? Acute on chronic combined systolic and diastolic CHF (congestive heart failure) ([place])   ? AKI (acute kidney injury) ([place])   ? Hyperlipidemia associated with type 2 diabetes mellitus ([place])   ? Overweight (BMI 25.0-29.9)   ? COVID-19 virus infection 06/07/2021  ? Generalized weakness 06/07/2021  ? GERD (gastroesophageal reflux disease) 06/07/2021  ? CHF (congestive heart failure) ([place]) 05/05/2021  ? GI bleed 04/13/2021  ? Anemia 04/13/2021  ? Symptomatic anemia 04/13/2021  ? CKD (chronic kidney disease), stage III ([place]) 04/13/2021  ? Acute on chronic systolic CHF (congestive heart failure) ([place]) 01/29/2021  ? Cognitive deficits   ? Pressure injury of skin 12/19/2020  ? Acute exacerbation of CHF (congestive heart failure) ([place]) 12/18/2020  ? COPD (chronic obstructive pulmonary disease) ([place]) 09/02/2020  ? Pulmonary nodule 1 cm or greater in diameter 01/03/2020  ? S/P TAVR (transcatheter aortic valve  replacement) 05/03/2019  ? Primary osteoarthritis involving multiple joints 05/03/2019  ? Persistent atrial fibrillation ([place]) 05/03/2019  ? Bilateral carotid artery stenosis 12/01/2017  ? 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/13/2014  ? Aortic valve disorder 05/03/2014  ? Chronic obstructive bronchitis without exacerbation ([place]) 05/03/2014  ? Derangement of left knee 05/03/2014  ? Esophageal reflux 05/03/2014  ? HTN (hypertension) 05/03/2014  ? Vitamin D deficiency 05/03/2014  ? ?PAST MEDICAL HISTORY:  ?Active Ambulatory Problems  ?  Diagnosis Date Noted  ? Aortic valve disorder 05/03/2014  ? Bilateral carotid artery stenosis 12/01/2017  ? Chronic obstructive bronchitis without exacerbation ([place]) 05/03/2014  ? 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/13/2014  ? Derangement of left knee 05/03/2014  ? Esophageal reflux 05/03/2014  ? HTN (hypertension) 05/03/2014  ? Vitamin D deficiency 05/03/2014  ? S/P TAVR (transcatheter aortic valve replacement) 05/03/2019  ? Primary osteoarthritis involving multiple joints 05/03/2019  ? Persistent atrial fibrillation ([place]) 05/03/2019  ? Pulmonary nodule 1 cm or greater in diameter 01/03/2020  ? COPD (chronic obstructive pulmonary disease) ([place]) 09/02/2020  ? Acute exacerbation of CHF (congestive heart failure) ([place]) 12/18/2020  ? Pressure injury of skin 12/19/2020  ? Cognitive deficits   ? Acute on chronic systolic CHF (congestive heart failure) ([place]) 01/29/2021  ? GI bleed 04/13/2021  ? Anemia 04/13/2021  ? Symptomatic anemia 04/13/2021  ? CKD (chronic kidney disease), stage III ([place]) 04/13/2021  ? CHF (congestive heart failure) ([place]) 05/05/2021  ? COVID-19 virus infection  06/07/2021  ? Generalized weakness 06/07/2021  ? GERD (gastroesophageal reflux disease) 06/07/2021  ? Atrial fibrillation with rapid ventricular response ([place]) 09/02/2021  ? Lab test positive for detection of COVID-19 virus   ? Acute on chronic combined systolic and diastolic CHF (congestive heart failure) ([place])   ?  AKI (acute kidney injury) ([place])   ? Hyperlipidemia associated with type 2 diabetes mellitus ([place])   ? Overweight (BMI 25.0-29.9)   ? Elevated troponin 09/02/2021  ? Leg cramps 09/03/2021  ? Type 2 diabetes mellitus

## 2021-11-27 ENCOUNTER — Telehealth: Payer: Self-pay

## 2021-11-27 ENCOUNTER — Telehealth: Payer: Self-pay | Admitting: Family Medicine

## 2021-11-27 NOTE — Telephone Encounter (Signed)
TCF son to [facility] that pt is on new med-Aldactone to help protect kidneys, has been having severe abdominal pain and nausea today.  He said she has had several days of nausea, self treating with Ginger Ale and ate a banana today which upset her stomach.  Initially he said that she was constipated and unable to have a BM when first d/c'd from the hospital.  She has had around 8 admissions over the last year.  She had been taking Senna at this NP had recommended then she began having some pain and was advised to try [place] when she called into the office.  He states not knowing how much of each she has been taking. He denies that she has back pain, fever or vomiting.  He states her vital signs were stable per son.  He was concerned given her hx of at times, severe hypokalemia, that she may be getting too low.  He denies any known hx of CP or palpitations.  He was wanting to see if her potassium could be checked and had called into her PCP but states being told that since pt was on Palliative Care that they could not be seen in the office and have labs done. Advised son that he had been misinformed and that Palliative did not replace patient's PCP.  Was unable to reach PCP before close of business and sent orders to [facility] for CBC and CMP stat, advised him to take pt to lab and they would send me results. He states he cannot get her there before the lab closes for the day.  Advised if pt's pain worsen or she is vomiting to take her into the ED for evaluation.  He verbalized understanding.  Will follow up the first part of the week.  [name] her NP PCP will be back in the office on Tuesday. ?Damaris Hippo FNP-C ?

## 2021-11-27 NOTE — Telephone Encounter (Signed)
---  Caller states that his mother needs to be seen for an ?upset stomach. Also, some of her medications were ?changed and her potassium level needs to be checked. --Caller is not with the patient at this time and is not ?able to 3way call. Reports he will be with her in about ?an hour or so and will try to call back then. ? ?11/27/2021 8:20:14 AM Clinical Call Durene Cal, RN, [place] ? ?11/27/21 1022: Of note pt is currently in palliative care. Louie Casa called & notified that Palliative care will continue to follow for complex medical decision making, advance care planning, and clarification of goals. Return 2 weeks or prn (as noted in video visit on 11/23/21). Son states he has not been happy with the care that has been provided; that he didn't know much about it before agreeing that his mother's care be managed by them. Encouraged him to call them & let them know exactly what he needs & what his concerns are for his mother; he verb understanding. ?

## 2021-12-02 ENCOUNTER — Other Ambulatory Visit: Payer: Self-pay | Admitting: Adult Health

## 2021-12-02 ENCOUNTER — Ambulatory Visit (INDEPENDENT_AMBULATORY_CARE_PROVIDER_SITE_OTHER): Payer: Medicare Other | Admitting: Adult Health

## 2021-12-02 ENCOUNTER — Telehealth: Payer: Self-pay | Admitting: Family Medicine

## 2021-12-02 ENCOUNTER — Encounter: Payer: Self-pay | Admitting: Adult Health

## 2021-12-02 VITALS — BP 108/60 | HR 84 | Temp 97.0°F | Ht 60.0 in | Wt 141.0 lb

## 2021-12-02 DIAGNOSIS — I5023 Acute on chronic systolic (congestive) heart failure: Secondary | ICD-10-CM

## 2021-12-02 DIAGNOSIS — I5022 Chronic systolic (congestive) heart failure: Secondary | ICD-10-CM

## 2021-12-02 DIAGNOSIS — R109 Unspecified abdominal pain: Secondary | ICD-10-CM

## 2021-12-02 DIAGNOSIS — I6523 Occlusion and stenosis of bilateral carotid arteries: Secondary | ICD-10-CM

## 2021-12-02 DIAGNOSIS — N1831 Chronic kidney disease, stage 3a: Secondary | ICD-10-CM

## 2021-12-02 DIAGNOSIS — E11638 Type 2 diabetes mellitus with other oral complications: Secondary | ICD-10-CM

## 2021-12-02 DIAGNOSIS — I10 Essential (primary) hypertension: Secondary | ICD-10-CM

## 2021-12-02 DIAGNOSIS — I4819 Other persistent atrial fibrillation: Secondary | ICD-10-CM

## 2021-12-02 DIAGNOSIS — J189 Pneumonia, unspecified organism: Secondary | ICD-10-CM

## 2021-12-02 DIAGNOSIS — R410 Disorientation, unspecified: Secondary | ICD-10-CM

## 2021-12-02 LAB — CBC WITH DIFFERENTIAL/PLATELET
Basophils Absolute: 0 10*3/uL (ref 0.0–0.1)
Basophils Relative: 0.9 % (ref 0.0–3.0)
Eosinophils Absolute: 0.1 10*3/uL (ref 0.0–0.7)
Eosinophils Relative: 1.7 % (ref 0.0–5.0)
HCT: 34.7 % — ABNORMAL LOW (ref 36.0–46.0)
Hemoglobin: 11.5 g/dL — ABNORMAL LOW (ref 12.0–15.0)
Lymphocytes Relative: 12.9 % (ref 12.0–46.0)
Lymphs Abs: 0.7 10*3/uL (ref 0.7–4.0)
MCHC: 33 g/dL (ref 30.0–36.0)
MCV: 92.4 fl (ref 78.0–100.0)
Monocytes Absolute: 0.5 10*3/uL (ref 0.1–1.0)
Monocytes Relative: 9.4 % (ref 3.0–12.0)
Neutro Abs: 4.1 10*3/uL (ref 1.4–7.7)
Neutrophils Relative %: 75.1 % (ref 43.0–77.0)
Platelets: 233 10*3/uL (ref 150.0–400.0)
RBC: 3.76 Mil/uL — ABNORMAL LOW (ref 3.87–5.11)
RDW: 18.2 % — ABNORMAL HIGH (ref 11.5–15.5)
WBC: 5.5 10*3/uL (ref 4.0–10.5)

## 2021-12-02 LAB — COMPREHENSIVE METABOLIC PANEL
ALT: 16 U/L (ref 0–35)
AST: 22 U/L (ref 0–37)
Albumin: 4 g/dL (ref 3.5–5.2)
Alkaline Phosphatase: 83 U/L (ref 39–117)
BUN: 33 mg/dL — ABNORMAL HIGH (ref 6–23)
CO2: 30 mEq/L (ref 19–32)
Calcium: 10 mg/dL (ref 8.4–10.5)
Chloride: 101 mEq/L (ref 96–112)
Creatinine, Ser: 1.06 mg/dL (ref 0.40–1.20)
GFR: 45.83 mL/min — ABNORMAL LOW (ref >60.00)
Glucose, Bld: 125 mg/dL — ABNORMAL HIGH (ref 70–99)
Potassium: 4.6 mEq/L (ref 3.5–5.1)
Sodium: 138 mEq/L (ref 135–145)
Total Bilirubin: 0.8 mg/dL (ref 0.2–1.2)
Total Protein: 7 g/dL (ref 6.0–8.3)

## 2021-12-02 MED ORDER — SUCRALFATE 1 G PO TABS: 1.0000 g | ORAL_TABLET | Freq: Two times a day (BID) | ORAL | 1 refills | Status: DC

## 2021-12-02 NOTE — Progress Notes (Signed)
? ?Subjective:  ? ? Patient ID: Tara Fields, female    DOB: [birth year], 88 y.o.   MRN: [id number] ? ?HPI ?88 year old female who  has a past medical history of Acute exacerbation of CHF (congestive heart failure) ([place]) (12/18/2020), AKI (acute kidney injury) ([place]), Aortic stenosis, CAD (coronary artery disease), COPD (chronic obstructive pulmonary disease) ([place]), Diabetes mellitus ([place]), Diverticulitis, GERD (gastroesophageal reflux disease), HTN (hypertension), Hyperlipidemia, Legally blind in left eye, as defined in [place], and Tobacco abuse. ? ?She presents with her son today for hospital follow up  ? ?Admit Date 11/08/2021 ?Discharge Date 11/15/2021 ? ?She presented to the emergency room with dyspnea and productive cough.  In the emergency room her BNP was 1638 chest x-ray noted cardiomegaly and pulmonary vascular congestion, repeat chest x-ray with additional opacities concerning for pneumonia.  While in the emergency room she was diuresed with IV Lasix and then subsequently started on antibiotics for pneumonia.  ? ?Hospital Course  ? ?Acute hypoxic respiratory failure; community-acquired pneumonia ?-Treated with IV ceftriaxone and switch to oral cefdinir.  She was treated with incentive spirometry and DuoNebs as well.  SLP evaluation ruled out aspiration.  She completed her antibiotic course and discharged home with palliative care services. ? ?Agitation/delirium ?-Hospital delirium in the setting of pneumonia, CHF ?-Labs are unremarkable and she was started on low-dose Seroquel every evening ? ?Acute on chronic systolic CHF ?-Next echo from 2022 had a reduced LV systolic function 25 to 94% with global hypokinesis.  Preserved RV systolic dysfunction.  She was diuresed with IV Lasix.  She was continued on torsemide and Toprol.  Aldactone was added and losartan was discontinued. ? ?Persistent atrial fibrillation ?-With RVR, started on amiodarone then discontinued.  Continued on Eliquis and Toprol 50 mg twice  daily ? ?CKD stage III ?-Stable, monitor ? ?Iron Deficiency Anemia  ?Hemoglobin relatively stable she was given IV iron during this hospital admission ? ?Hypertension ?-Continue metoprolol and Aldactone ? ?Type 2 diabetes mellitus ?-Diet controlled. ? ?Today she reports that she feels to be back to near baseline.  Her son reports that the delirium has nearly resolved and she has not been taking her Seroquel outpatient.  She denies fevers, chills.  She does have some mild swelling in her lower extremities but not past her baseline. ? ?Biggest complaint is that of epigastric pain and nausea with decreased appetite.  She reports that this was present before her hospital admission.  She denies vomiting or abnormal bowel movements.  He does not have any burning sensation or chest pain. ? ?Since being switched to oral Aldactone she has continued with her potassium supplementation. ? ? ?Review of Systems  ?Constitutional:  Positive for activity change and fatigue.  ?HENT: Negative.    ?Eyes: Negative.   ?Respiratory: Negative.    ?Cardiovascular:  Positive for leg swelling.  ?Gastrointestinal:  Positive for abdominal pain and nausea. Negative for constipation, diarrhea and vomiting.  ?Endocrine: Negative.   ?Genitourinary: Negative.   ?Musculoskeletal:  Positive for arthralgias.  ?Neurological:  Positive for weakness.  ? ?Past Medical History:  ?Diagnosis Date  ? Acute exacerbation of CHF (congestive heart failure) ([place]) 12/18/2020  ? AKI (acute kidney injury) ([place])   ? Aortic stenosis   ? s/p TAVR 26 mm Edwards Sapien 3 THV July 2020  ? CAD (coronary artery disease)   ? COPD (chronic obstructive pulmonary disease) ([place])   ? Diabetes mellitus ([place])   ? Diverticulitis   ? GERD (gastroesophageal reflux disease)   ?  HTN (hypertension)   ? Hyperlipidemia   ? Legally blind in left eye, as defined in [place]   ? Tobacco abuse   ? ? ?Social History  ? ?Socioeconomic History  ? Marital status: Widowed  ?  Spouse name: Not on file  ?  Number of children: 2  ? Years of education: Not on file  ? Highest education level: High school graduate  ?Occupational History  ? Occupation: Retired  ?  Comment: homemaker  ?Tobacco Use  ? Smoking status: Former  ?  Packs/day: 1.00  ?  Years: 70.00  ?  Pack years: 70.00  ?  Types: Cigarettes  ?  Quit date: 12/15/2019  ?  Years since quitting: 1.9  ? Smokeless tobacco: Never  ? Tobacco comments:  ?  Pt states quiting a year ago. 01/03/20 [facility]  ?Vaping Use  ? Vaping Use: Never used  ?Substance and Sexual Activity  ? Alcohol use: No  ?  Alcohol/week: 0.0 standard drinks  ? Drug use: No  ? Sexual activity: Not Currently  ?Other Topics Concern  ? Not on file  ?Social History Narrative  ? Widowed- was married for 40 years   ? Has two sons   ?   ? ?Social Determinants of Health  ? ?Financial Resource Strain: Low Risk   ? Difficulty of Paying Living Expenses: Not hard at all  ?Food Insecurity: No Food Insecurity  ? Worried About Charity fundraiser in the Last Year: Never true  ? Ran Out of Food in the Last Year: Never true  ?Transportation Needs: No Transportation Needs  ? Lack of Transportation (Medical): No  ? Lack of Transportation (Non-Medical): No  ?Physical Activity: Insufficiently Active  ? Days of Exercise per Week: 5 days  ? Minutes of Exercise per Session: 10 min  ?Stress: No Stress Concern Present  ? Feeling of Stress : Not at all  ?Social Connections: Socially Isolated  ? Frequency of Communication with Friends and Family: Three times a week  ? Frequency of Social Gatherings with Friends and Family: Three times a week  ? Attends Religious Services: Never  ? Active Member of Clubs or Organizations: No  ? Attends Archivist Meetings: Never  ? Marital Status: Widowed  ?Intimate Partner Violence: Not At Risk  ? Fear of Current or Ex-Partner: No  ? Emotionally Abused: No  ? Physically Abused: No  ? Sexually Abused: No  ? ? ?Past Surgical History:  ?Procedure Laterality Date  ? ABDOMINAL HYSTERECTOMY    ?  APPENDECTOMY    ? BACK SURGERY    ? CATARACT EXTRACTION, BILATERAL    ? CHOLECYSTECTOMY    ? COLONOSCOPY WITH PROPOFOL N/A 04/16/2021  ? Procedure: COLONOSCOPY WITH PROPOFOL;  Surgeon: Milus Banister, MD;  Location: [facility];  Service: Gastroenterology;  Laterality: N/A;  ? ESOPHAGOGASTRODUODENOSCOPY (EGD) WITH PROPOFOL N/A 04/15/2021  ? Procedure: ESOPHAGOGASTRODUODENOSCOPY (EGD) WITH PROPOFOL;  Surgeon: Doran Stabler, MD;  Location: [facility];  Service: Gastroenterology;  Laterality: N/A;  ? ? ?Family History  ?Problem Relation Age of Onset  ? Diabetes Mother   ? Stroke Brother   ? Cerebral palsy Brother   ? Coronary artery disease Neg Hx   ? ? ?Allergies  ?Allergen Reactions  ? Amlodipine Besy-Benazepril Hcl Cough  ? Hctz [Hydrochlorothiazide] Other (See Comments)  ?  Per family, every diuretic the patient has been on flushes out her out and ultimately results in CRAMPING/JAUNDICE (added potassium might help)  ? Other  Other (See Comments)  ?  History of diverticulitis- CANNOT TOLERATE NUTS, CORN, AND ANY FOODS NOT EASILY DIGESTED- the patient AVOIDS these  ? Cyclobenzaprine Other (See Comments)  ?  Confusion- does not tolerate well   ? Diclofenac Sodium Diarrhea  ? Hydralazine Other (See Comments)  ?  Per family, every diuretic the patient has been on flushes out her out and ultimately results in CRAMPING/JAUNDICE (added potassium might help)  ? Methylprednisolone Other (See Comments)  ?  Medrol/CORTICOSTEROIDS = A LOT OF ENERGY  ? Oxycodone Other (See Comments)  ?  Patient becomes very disorientated   ? ? ?Current Outpatient Medications on File Prior to Visit  ?Medication Sig Dispense Refill  ? acetaminophen (TYLENOL) 500 MG tablet Take 500 mg by mouth 3 (three) times daily as needed for moderate pain. Take with Tramadol    ? atorvastatin (LIPITOR) 40 MG tablet TAKE 1 TABLET BY MOUTH DAILY AT 6 PM. (Patient taking differently: Take 40 mg by mouth every evening.) 90 tablet 1  ? cholecalciferol (VITAMIN  D3) 25 MCG (1000 UNIT) tablet Take 1,000 Units by mouth 2 (two) times daily.    ? Coenzyme Q10 (CO Q-10) 300 MG CAPS Take 300 mg by mouth in the morning.    ? [facility] ASPIRIN ADULT LOW DOSE 81 MG chewable tablet CH

## 2021-12-02 NOTE — Telephone Encounter (Signed)
Left message for son that I was following up on how his mother was doing, had seen she had a visit with PCP today and we are scheduled for visit tomorrow but due to Covid exposure & their request for in-person visit I was attempting to reschedule next week, either Wednesday or Thursday.  Damaris Hippo FNP-C ?

## 2021-12-03 ENCOUNTER — Telehealth: Payer: Self-pay | Admitting: Family Medicine

## 2021-12-03 ENCOUNTER — Other Ambulatory Visit: Payer: Medicare Other | Admitting: Family Medicine

## 2021-12-03 NOTE — Telephone Encounter (Signed)
Spoke with pt's son Louie Casa and advised of clearance for provider to return to work with ability to see pt today.  He asked to reschedule for next week. Appt rescheduled for 12/09/21 at 10:30 am. He states pt saw [name] who made some adjustments in meds to decrease potassium and iron dose after receiving lab work as pt c/o GI upset after taking meds.  Louie Casa said that Tommi Rumps did clear up the misunderstanding that pt could be followed by both their office and [facility].  Kare Odysseus Cada FNP-C ?

## 2021-12-09 ENCOUNTER — Other Ambulatory Visit: Payer: Medicare Other | Admitting: Family Medicine

## 2021-12-09 VITALS — BP 112/74 | HR 68 | Resp 18

## 2021-12-09 DIAGNOSIS — Z515 Encounter for palliative care: Secondary | ICD-10-CM

## 2021-12-09 DIAGNOSIS — R198 Other specified symptoms and signs involving the digestive system and abdomen: Secondary | ICD-10-CM

## 2021-12-09 DIAGNOSIS — R1010 Upper abdominal pain, unspecified: Secondary | ICD-10-CM

## 2021-12-09 NOTE — Progress Notes (Signed)
? ? ?Manufacturing engineer ?Community Palliative Care Consult Note ?Telephone: [phone]  ?Fax: [phone]  ? ? ?Date of encounter: 12/09/21 ?10:52 AM ?PATIENT NAME: Tara Fields ?[address] ?[place] [address]   ?[phone] (home)  ?DOB: [birth year] ?MRN: [id number] ?PRIMARY CARE PROVIDER:    ?[name], NP,  ?[place] ?[place] [address] ?[phone] ? ?REFERRING PROVIDER:   ?[name], NP ?[place] ?[place],  [place] [address] ?[phone] ? ?RESPONSIBLE PARTY:    ?Contact Information   ? ? Name Relation Home Work Mobile  ? Pelzel,Randy Son [phone]    ? ?  ? ? ? ?I met face to face with patient and son in her assisted living facility. Palliative Care was asked to follow this patient by consultation request of  Nafziger, Tommi Rumps, NP to address advance care planning and complex medical decision making. This is a follow up visit. ? ?                                 ASSESSMENT, SYMPTOM MANAGEMENT AND PLAN / RECOMMENDATIONS:  ?Abd pain/Difficulty swallowing large pills ?Possible GERD with esophagitis or ulcer.  Difficulty swallowing Carafate due to size. ?Sent in new prescription for Carafate suspension 1 gm (26m) BID x 30 days (son aware that costs may not be covered) ?Agree with small, frequent meals and avoidance of greasy foods. ?Continue PPI daily. ? ?2.  Palliative Care Encounter ?Discussed goals of care and advance care planning. ?Continue to discuss at follow up visits to address when pt ready. ? ?3.   Constipation ?Resume Metamucil 1-2 gummies per day with increased water intake. ?Can use Miralax 17 gm daily prn ? ?Advance Care Planning/Goals of Care: Goals include to maximize quality of life and symptom management. Patient/health care surrogate gave his/her permission to discuss. ?Our advance care planning conversation included a discussion about:    ?The value and importance of advance care planning  ?Exploration of personal, cultural  or spiritual beliefs that might influence medical decisions-"If I'm dead I don't want to be brought back." ?Exploration of goals of care in the event of a sudden injury or illness -Pt sais she does not want to be resuscitated, has DNR and does not want intubation but was not ready to address MOST ?Identification of a healthcare agent-son RLouie Casa?Review of an advance directive document-MOST-pt not ready to commit to decision yet. ?Decision not to resuscitate or to de-escalate disease focused treatments due to poor prognosis_Did states wants DNR/DNI ?CODE STATUS: ? ?DNR/DNI (no MOST form yet) ? ? ?Follow up Palliative Care Visit: Palliative care will continue to follow for complex medical decision making, advance care planning, and clarification of goals. Return 4 weeks or prn. ? ? ?This visit was coded based on medical decision making (MDM). ? ?PPS: 60% ? ?HOSPICE ELIGIBILITY/DIAGNOSIS: TBD ? ?Chief Complaint:  ?Palliative Care is following for chronic medical management of chronic systolic CHF in setting of aortic stenosis and atrial fibrillation.  Follow up is also to address advance care planning and goals of care. Currently c/o pain in upper abdomen just below rib cage. ? ?HISTORY OF PRESENT ILLNESS:  BDyneshia Fields a 88y.o. year old female with chronic combined systolic and diastolic heart failure, is s/p TAVR with mild continued aortic stenosis, atrial fibrillation, HTN, COPD, pulmonary nodule, GERD, DM, OA of multiple joints, CKD and anemia. Denies CP, recent falls.  Pain below rib cage  in abdomen at worst is 6-7, described as a "knot in my stomach".  Has more pain if she doesn't eat.  Can eat something light and pain will subside, feels like tightening. Avoids fried foods. Started on Carafate but having trouble swallowing pill. Has had some significant constipation issues but had good luck with metamucil before, being regular  Has had hysterectomy, appendectomy and cholecystectomy.  No blood in stool.   Drinks well.  Bathing and dressing self.  ? ? ?History obtained from review of EMR, discussion with primary team, and interview with family, facility staff/caregiver and/or Ms. Vancleve.  ?I reviewed available labs, medications, imaging, studies and related documents from the EMR.  Records reviewed and summarized above.  ? ?ROS ?General: NAD ?EYES: denies vision changes ?ENMT: denies dysphagia, describes trouble swallowing Carafate due to size of pill ?Cardiovascular: denies chest pain, denies DOE ?Pulmonary: denies cough, denies increased SOB ?Abdomen: endorses good appetite, denies constipation at present but recently has had difficulty, endorses continence of bowel ?GU: denies dysuria, endorses continence of urine ?MSK:  denies increased weakness,  no falls reported ?Skin: denies rashes or wounds ?Neurological: some trouble sleeping with abd tightening pain waking her up ?Psych: Endorses positive mood ?Heme/lymph/immuno: denies bruises, abnormal bleeding ? ?Physical Exam: ?Current and past weights: 138 lbs as of 12/08/21 ?Constitutional: NAD ?General: WN, WD ?EYES: anicteric sclera, lids intact, no discharge  ?ENMT: intact hearing, oral mucous membranes moist, dentition intact ?CV: S1S2, RRR with LUSB murmur,  1+ LE edema, non-pitting, and trace in right ankle ?Pulmonary: CTAB, no increased work of breathing, no cough, room air ?Abdomen: normo-active BS + 4 quadrants, soft and non tender, no ascites ?GU: deferred ?MSK: no sarcopenia, moves all extremities, ambulatory with rollator ?Skin: warm and dry, no rashes or wounds on visible skin ?Neuro:  no generalized weakness,  no cognitive impairment ?Psych: non-anxious affect, A and O x 3 ?Hem/lymph/immuno: no widespread bruising ? ? ?Thank you for the opportunity to participate in the care of Tara Fields.  The palliative care team will continue to follow. Please call our office at [phone] if we can be of additional assistance.  ? ?Marijo Conception, FNP  -C ? ?COVID-19 PATIENT SCREENING TOOL ?Asked and negative response unless otherwise noted:  ? ?Have you had symptoms of covid, tested positive or been in contact with someone with symptoms/positive test in the past 5-10 days? No ?

## 2021-12-12 ENCOUNTER — Encounter: Payer: Self-pay | Admitting: Family Medicine

## 2021-12-12 DIAGNOSIS — R1010 Upper abdominal pain, unspecified: Secondary | ICD-10-CM | POA: Insufficient documentation

## 2021-12-12 DIAGNOSIS — R198 Other specified symptoms and signs involving the digestive system and abdomen: Secondary | ICD-10-CM | POA: Insufficient documentation

## 2021-12-12 DIAGNOSIS — R1310 Dysphagia, unspecified: Secondary | ICD-10-CM | POA: Insufficient documentation

## 2021-12-14 ENCOUNTER — Encounter: Payer: Self-pay | Admitting: Adult Health

## 2021-12-15 NOTE — Telephone Encounter (Signed)
FYI

## 2021-12-30 ENCOUNTER — Non-Acute Institutional Stay: Payer: Medicare Other | Admitting: Family Medicine

## 2021-12-30 DIAGNOSIS — Z515 Encounter for palliative care: Secondary | ICD-10-CM

## 2021-12-30 NOTE — Progress Notes (Signed)
[facility] Palliative Care Consult Note Telephone: [phone]  Fax: [phone]    Date of encounter: 12/30/21 10:45 AM PATIENT NAME: Tara Fields [place] [address]   [phone] (home)  DOB: 02/24/1928 MRN: [id number] PRIMARY CARE PROVIDER:    [name], NP,  [place] [address] [phone]  REFERRING PROVIDER:   [name], NP [address],  [place] [address] [phone]  RESPONSIBLE PARTY:    Contact Information     Name Relation Home Work Mobile   Sheeley,Randy Son [phone]          I met face to face with patient and son in her independent living facility. Palliative Care was asked to follow this patient by consultation request of  Nafziger, Tommi Rumps, NP to address advance care planning and complex medical decision making. This is a follow up visit.                                   ASSESSMENT, SYMPTOM MANAGEMENT AND PLAN / RECOMMENDATIONS:  Palliative Care Encounter Chronic combined systolic and diastolic heart failure stable. MOST completed per pt's wishes.    Advance Care Planning/Goals of Care: Goals include to maximize quality of life and symptom management.  CODE STATUS: MOST as of 12/30/21: DNR/DNI with limited additional intervention Antibiotics if indicated  IV fluids and feeding tube on a case by case, time limited basis    Follow up Palliative Care Visit: Palliative care will continue to follow for complex medical decision making, advance care planning, and clarification of goals. Return 4 weeks or prn.    This visit was coded based on medical decision making (MDM).  PPS: 50%  HOSPICE ELIGIBILITY/DIAGNOSIS: TBD  Chief Complaint:   Palliative Care is following for medical management of chronic combined systolic and diastolic heart failure and COPD.  Follow up is also to address advance care planning and defining/refining  goals of care.  HISTORY OF PRESENT ILLNESS:  Tara Fields is a 88 y.o. year old female with chronic combined systolic and diastolic heart failure, COPD, CAD, aortic stenosis status post TAVR, bilateral carotid artery stenosis, hypertension, persistent atrial fibrillation, GERD, history of GI bleed, diabetes, hyperlipidemia.,  OA of multiple joints, CKD, diverticulitis, vitamin D deficiency, cognitive deficit, anemia, leg cramps, malnutrition, pain of upper abdomen and difficulty swallowing pills. Once in a while has nausea and has to watch what she eats.  Appetite is fair.  Denies pain. No trouble with swallowing. Doesn't sleep well.  Good mood most of the time.  Denies SOB, CP. No falls.  Weight over the last week has been stable at 139-140 lbs.  History obtained from review of EMR, discussion with son and/or Ms. Thiem.  I reviewed available labs, medications, imaging, studies and related documents from the EMR.  Records reviewed and summarized above.   ROS General: NAD EYES: denies vision changes ENMT: denies dysphagia Cardiovascular: denies chest pain, denies DOE Pulmonary: denies cough, denies increased SOB Abdomen: endorses fair appetite, intermittent constipation improves with use of occasional laxative, endorses continence of bowel GU: denies dysuria, endorses incontinence of urine MSK:  denies increased weakness, no falls reported Skin: denies rashes or wounds Neurological: Endorses intermittent pain, denies insomnia Psych: Endorses positive mood Heme/lymph/immuno: denies bruises, abnormal bleeding  Physical Exam: Current and past weights: 139 lbs 12/30/21 Constitutional: NAD General: WN,WD EYES: anicteric sclera, lids intact, no  discharge  ENMT: intact hearing, oral mucous membranes moist, dentition intact CV: S1S2, RRR, no LE edema Pulmonary: CTAB, no increased work of breathing, no cough, room air Abdomen: normo-active BS + 4 quadrants, soft and non tender, no  ascites GU: deferred MSK: no sarcopenia, moves all extremities, ambulatory with rollator Skin: warm and dry, no rashes or wounds on visible skin Neuro:  no generalized weakness,  no cognitive impairment Psych: non-anxious affect, A and O x 3 Hem/lymph/immuno: no widespread bruising   Thank you for the opportunity to participate in the care of Ms. Guilbault.  The palliative care team will continue to follow. Please call our office at [phone] if we can be of additional assistance.   Marijo Conception, FNP -C  COVID-19 PATIENT SCREENING TOOL Asked and negative response unless otherwise noted:   Have you had symptoms of covid, tested positive or been in contact with someone with symptoms/positive test in the past 5-10 days? No

## 2021-12-31 ENCOUNTER — Other Ambulatory Visit: Payer: Self-pay | Admitting: Adult Health

## 2021-12-31 DIAGNOSIS — M8949 Other hypertrophic osteoarthropathy, multiple sites: Secondary | ICD-10-CM

## 2021-12-31 DIAGNOSIS — I5043 Acute on chronic combined systolic (congestive) and diastolic (congestive) heart failure: Secondary | ICD-10-CM

## 2021-12-31 MED ORDER — METOPROLOL SUCCINATE ER 50 MG PO TB24: 50.0000 mg | ORAL_TABLET | Freq: Two times a day (BID) | ORAL | 0 refills | Status: DC

## 2021-12-31 MED ORDER — SPIRONOLACTONE 25 MG PO TABS: 12.5000 mg | ORAL_TABLET | Freq: Every day | ORAL | 0 refills | Status: DC

## 2021-12-31 MED ORDER — TORSEMIDE 20 MG PO TABS: 40.0000 mg | ORAL_TABLET | Freq: Every day | ORAL | 0 refills | Status: DC

## 2021-12-31 MED ORDER — TRAMADOL HCL 50 MG PO TABS: 50.0000 mg | ORAL_TABLET | Freq: Three times a day (TID) | ORAL | 0 refills | Status: AC

## 2022-01-02 ENCOUNTER — Other Ambulatory Visit: Payer: Self-pay | Admitting: Adult Health

## 2022-01-02 ENCOUNTER — Encounter: Payer: Self-pay | Admitting: Family Medicine

## 2022-01-05 ENCOUNTER — Telehealth: Payer: Self-pay

## 2022-01-05 NOTE — Telephone Encounter (Signed)
(  3:28 pm) PC SW left a message for patient's son-Randy to extend support to him and provide resources. SW requested a return call.

## 2022-01-21 ENCOUNTER — Other Ambulatory Visit: Payer: Medicare Other

## 2022-01-21 DIAGNOSIS — Z515 Encounter for palliative care: Secondary | ICD-10-CM

## 2022-02-03 ENCOUNTER — Non-Acute Institutional Stay: Payer: Medicare Other | Admitting: Family Medicine

## 2022-02-03 VITALS — BP 106/60 | HR 79 | Resp 18 | Wt 137.0 lb

## 2022-02-03 DIAGNOSIS — R198 Other specified symptoms and signs involving the digestive system and abdomen: Secondary | ICD-10-CM

## 2022-02-03 DIAGNOSIS — I5022 Chronic systolic (congestive) heart failure: Secondary | ICD-10-CM

## 2022-02-03 DIAGNOSIS — N3000 Acute cystitis without hematuria: Secondary | ICD-10-CM | POA: Insufficient documentation

## 2022-02-03 HISTORY — DX: Acute cystitis without hematuria: N30.00

## 2022-02-03 NOTE — Progress Notes (Signed)
[facility] Palliative [facility] Consult Note Telephone: [phone]  Fax: [phone]    Date of encounter: 02/03/22 9:17 AM PATIENT NAME: Tara Fields [place] [address]   [phone] (home)  DOB: 1928/06/25 MRN: [id number] PRIMARY CARE PROVIDER:    [name], NP,  [place] [address] [phone]  REFERRING PROVIDER:   [name], NP [place] [address],  [place] [address] [phone]  RESPONSIBLE PARTY:    Contact Information     Name Relation Home Work Mobile   Lagerstrom,Randy Son [phone]          I met face to face with patient and son in her independent living facility. Palliative Care was asked to follow this patient by consultation request of  Nafziger, Tommi Rumps, NP to address advance care planning and complex medical decision making. This is a follow up visit.                                   ASSESSMENT, SYMPTOM MANAGEMENT AND PLAN / RECOMMENDATIONS: Acute cystitis without hematuria Completed antibiotics and symptoms resolved. Encouraged prevention with intake of Cranberry daily and water.  2.  Difficulty swallowing larger pills Doing well with change to liquid potassium No evidence of aspiration  3. Chronic combined systolic and diastolic heart failure Stable, appears euvolemic with stable weight and no edema DOE/SOB at baseline Continue Toprol XL 50 mg BID, ASA 81 mg daily, Spironolactone 12.5 mg daily, Torsemide 40 mg daily with potassium supplement and Atorvastatin 40 mg nightly Daily weights with notification if weight increases 2-3 lbs/day or 5 lbs in 1 week.     Advance Care Planning/Goals of Care: Goals include to maximize quality of life and symptom management.  CODE STATUS: MOST as of 12/30/21: DNR/DNI with limited additional intervention Antibiotics if indicated  IV fluids and feeding tube on a case by case, time limited  basis    Follow up Palliative Care Visit: Palliative care will continue to follow for complex medical decision making, advance care planning, and clarification of goals. Return 6 weeks or prn.    This visit was coded based on medical decision making (MDM).  PPS: 50%  HOSPICE ELIGIBILITY/DIAGNOSIS: TBD  Chief Complaint:   Palliative Care is following for medical management of chronic combined systolic and diastolic heart failure, COPD with recent UTI.    HISTORY OF PRESENT ILLNESS:  Tara Fields is a 88 y.o. year old female with chronic combined systolic and diastolic heart failure, COPD, CAD, aortic stenosis status post TAVR, bilateral carotid artery stenosis, hypertension, persistent atrial fibrillation, GERD, history of GI bleed, diabetes, hyperlipidemia.,  OA of multiple joints, CKD, diverticulitis, vitamin D deficiency, cognitive deficit, anemia, leg cramps, malnutrition, pain of upper abdomen and difficulty swallowing pills so she has changed to liquid potassium. She has trouble sleeping in the evening due to daylight.  Appetite is fair but since having Covid has lost her sense of smell.  Doing ensure. Got a mobility scooter but continues to walk.  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 Denies pain currently but has had shots in her knees. No trouble with swallowing. Doesn't sleep well. Denies SOB, CP. No falls.  Weight stable at 137 lbs.  History obtained from review of EMR, discussion with son and/or Ms. Califf.  I reviewed available labs, medications, imaging, studies and related documents from the EMR.  Records reviewed and summarized above.  ROS General: NAD ENMT: denies dysphagia Cardiovascular: denies chest pain, denies DOE Pulmonary: denies cough, denies increased SOB Abdomen: endorses fair appetite, constipation, endorses continence of bowel GU: denies dysuria, endorses incontinence of urine MSK:  denies increased weakness, no falls reported Skin: denies rashes or  wounds Neurological: No pain currently, having difficulty falling asleep with it being light outside Psych: Endorses positive mood   Physical Exam: Current and past weights: 137 lbs on 02/03/22, 139 lbs on  12/30/21 Constitutional: NAD General: WN,WD CV: S1S2, IRIR, no LE edema Pulmonary: CTAB, no increased work of breathing, no cough, room air Abdomen: normo-active BS + 4 quadrants, soft and non tender, no ascites GU: deferred MSK: no sarcopenia, moves all extremities, ambulatory with rollator Skin: warm and dry, no rashes or wounds on visible skin Neuro:  no generalized weakness,  no cognitive impairment Psych: non-anxious affect, A and O x 3   Thank you for the opportunity to participate in the care of Ms. Stenzel.  The palliative care team will continue to follow. Please call our office at [phone] if we can be of additional assistance.   Marijo Conception, FNP -C  COVID-19 PATIENT SCREENING TOOL Asked and negative response unless otherwise noted:   Have you had symptoms of covid, tested positive or been in contact with someone with symptoms/positive test in the past 5-10 days? No

## 2022-02-05 ENCOUNTER — Encounter: Payer: Medicare Other | Admitting: Family Medicine

## 2022-02-19 ENCOUNTER — Other Ambulatory Visit: Payer: Self-pay | Admitting: Adult Health

## 2022-03-16 ENCOUNTER — Encounter: Payer: Self-pay | Admitting: Adult Health

## 2022-03-16 ENCOUNTER — Other Ambulatory Visit: Payer: Self-pay | Admitting: Adult Health

## 2022-03-16 DIAGNOSIS — M8949 Other hypertrophic osteoarthropathy, multiple sites: Secondary | ICD-10-CM

## 2022-03-16 MED ORDER — TRAMADOL HCL 50 MG PO TABS: 50.0000 mg | ORAL_TABLET | Freq: Three times a day (TID) | ORAL | 2 refills | Status: AC

## 2022-03-16 NOTE — Telephone Encounter (Signed)
Pt needs an appt. For more refills

## 2022-03-17 ENCOUNTER — Encounter: Payer: Self-pay | Admitting: Cardiovascular Disease

## 2022-03-17 ENCOUNTER — Other Ambulatory Visit: Payer: Medicare Other | Admitting: Family Medicine

## 2022-03-17 ENCOUNTER — Ambulatory Visit (INDEPENDENT_AMBULATORY_CARE_PROVIDER_SITE_OTHER): Payer: Medicare Other | Admitting: Cardiovascular Disease

## 2022-03-17 VITALS — BP 106/60 | HR 73 | Ht 60.0 in | Wt 135.0 lb

## 2022-03-17 DIAGNOSIS — I2510 Atherosclerotic heart disease of native coronary artery without angina pectoris: Secondary | ICD-10-CM | POA: Diagnosis not present

## 2022-03-17 DIAGNOSIS — I10 Essential (primary) hypertension: Secondary | ICD-10-CM

## 2022-03-17 DIAGNOSIS — I6523 Occlusion and stenosis of bilateral carotid arteries: Secondary | ICD-10-CM

## 2022-03-17 DIAGNOSIS — I350 Nonrheumatic aortic (valve) stenosis: Secondary | ICD-10-CM | POA: Diagnosis not present

## 2022-03-17 DIAGNOSIS — I5022 Chronic systolic (congestive) heart failure: Secondary | ICD-10-CM

## 2022-03-17 DIAGNOSIS — I480 Paroxysmal atrial fibrillation: Secondary | ICD-10-CM

## 2022-03-17 LAB — BASIC METABOLIC PANEL
BUN/Creatinine Ratio: 44 — ABNORMAL HIGH (ref 12–28)
BUN: 53 mg/dL — ABNORMAL HIGH (ref 10–36)
CO2: 23 mmol/L (ref 20–29)
Calcium: 9.7 mg/dL (ref 8.7–10.3)
Chloride: 100 mmol/L (ref 96–106)
Creatinine, Ser: 1.21 mg/dL — ABNORMAL HIGH (ref 0.57–1.00)
Glucose: 129 mg/dL — ABNORMAL HIGH (ref 70–99)
Potassium: 4.2 mmol/L (ref 3.5–5.2)
Sodium: 141 mmol/L (ref 134–144)
eGFR: 42 mL/min/{1.73_m2} — ABNORMAL LOW (ref >59)

## 2022-03-17 NOTE — Progress Notes (Signed)
Chief Complaint  Patient presents with   Follow-up    Atrial fibrillation   History of Present Illness: 88 yo female with history of severe AS s/p TAVR at [place], COPD, CAD, GERD, persistent atrial fibrillation, DM, HTN, HLD, tobacco abuse and diverticulitis here today for follow up. I saw her remotely in 2016. She re-established in my office January 2022. She underwent TAVR at [facility] 03/07/19 with placement of a 26 mm Sapien Ultra THV from the right femoral artery approach. She developed a pseudoaneurysm post TAVR and had thrombin injection. She has atrial fibrillation and is on Eliquis. She has carotid artery disease. She tells me that no stent was placed in her carotid artery. Cardiac cath March 2019 at [place] with 25% mid LAD stenosis, 50% stenosis second diagonal branch, 50% distal Circumflex stenosis, 75% ostial stenosis of a small Diagonal branch. Echo September 2020 at [facility] with LVEF=45-50%. Moderate MR. Bioprosthetic aortic valve working well. Repeat echo at [facility] July 2021 with no change, Trivial perivalvular leak noted. She was admitted to [facility] 12/19/20 with acute systolic CHF. Echo 12/20/20 with LVEF=25-30%. No ischemic evaluation was planned per discussion with Dr. Percival Spanish and the patient. She was diuresed with IV Lasix. Norvasc was stopped. She was readmitted with CHF June 2022 after being treated with IV Lasix. She was doing well on office follow up in August 2022 but readmitted September 2022 with CHF. She was diuresed and discharged home on Torsemide. She was readmitted October 2022 with Covid and was diuresed. She was readmitted January 2023 with rapid atrial fib. Rate controlled with IV Cardizem. Mild troponin elevation due to demand ischemia. She was seen in our office in February 2023 and was doing well. She was then admitted to [facility] in March 2023 with pneumonia and volume overload. She was followed by the Advanced Heart Failure team and was rate controlled with IV amiodarone and diuresed with  IV Lasix.   He is here today for follow up. The patient denies any chest pain, dyspnea, palpitations, lower extremity edema, orthopnea, PND, dizziness, near syncope or syncope.   Primary Care Physician: [name], NP  Past Medical History:  Diagnosis Date   Acute exacerbation of CHF (congestive heart failure) ([place]) 12/18/2020   AKI (acute kidney injury) ([place])    Aortic stenosis    s/p TAVR 26 mm Edwards Sapien 3 THV July 2020   CAD (coronary artery disease)    COPD (chronic obstructive pulmonary disease) (HCC)    Diabetes mellitus ([place])    Diverticulitis    GERD (gastroesophageal reflux disease)    HTN (hypertension)    Hyperlipidemia    Legally blind in left eye, as defined in [place]    Tobacco abuse    UTI (urinary tract infection) 11/10/2021    Past Surgical History:  Procedure Laterality Date   ABDOMINAL HYSTERECTOMY     APPENDECTOMY     BACK SURGERY     CATARACT EXTRACTION, BILATERAL     CHOLECYSTECTOMY     COLONOSCOPY WITH PROPOFOL N/A 04/16/2021   Procedure: COLONOSCOPY WITH PROPOFOL;  Surgeon: Milus Banister, MD;  Location: [facility];  Service: Gastroenterology;  Laterality: N/A;   ESOPHAGOGASTRODUODENOSCOPY (EGD) WITH PROPOFOL N/A 04/15/2021   Procedure: ESOPHAGOGASTRODUODENOSCOPY (EGD) WITH PROPOFOL;  Surgeon: Doran Stabler, MD;  Location: [facility];  Service: Gastroenterology;  Laterality: N/A;    Current Outpatient Medications  Medication Sig Dispense Refill   acetaminophen (TYLENOL) 500 MG tablet Take 500 mg by mouth 3 (three) times  daily as needed for moderate pain. Take with Tramadol     atorvastatin (LIPITOR) 40 MG tablet TAKE 1 TABLET BY MOUTH DAILY AT 6 PM. 90 tablet 1   cholecalciferol (VITAMIN D3) 25 MCG (1000 UNIT) tablet Take 1,000 Units by mouth 2 (two) times daily.     Coenzyme Q10 (CO Q-10) 300 MG CAPS Take 300 mg by mouth in the morning.     ELIQUIS 5 MG TABS tablet TAKE 1 TABLET BY MOUTH TWICE A DAY (Patient taking differently: Take 5 mg  by mouth 2 (two) times daily.) 180 tablet 1   Ferrous Gluconate 324 (37.5 Fe) MG TABS Take 1 tablet (324 mg total) by mouth daily with breakfast. (Patient taking differently: Take 324 mg by mouth daily with breakfast. Every other day) 90 tablet 3   Lidocaine-Menthol, Spray, 4-1 % LIQD Apply 1 spray topically daily as needed (back/knee pain/restless legs).     Magnesium 200 MG TABS Take 200 mg by mouth every evening.     metoprolol succinate (TOPROL-XL) 50 MG 24 hr tablet Take 1 tablet (50 mg total) by mouth 2 (two) times daily. Take with or immediately following a meal. 180 tablet 0   Multiple Vitamin (MULTIVITAMIN WITH MINERALS) TABS tablet Take 1 tablet by mouth 2 (two) times daily. Centrum Minis     psyllium (METAMUCIL) 58.6 % powder Take 1 packet by mouth daily as needed (for constipation).     spironolactone (ALDACTONE) 25 MG tablet Take 0.5 tablets (12.5 mg total) by mouth daily. 45 tablet 0   terazosin (HYTRIN) 10 MG capsule Take 1 capsule (10 mg total) by mouth at bedtime. 90 capsule 1   torsemide (DEMADEX) 20 MG tablet Take 2 tablets (40 mg total) by mouth daily. 180 tablet 0   traMADol (ULTRAM) 50 MG tablet Take 1 tablet (50 mg total) by mouth 3 (three) times daily. 90 tablet 2   vitamin B-12 (CYANOCOBALAMIN) 1000 MCG tablet Take 1 tablet (1,000 mcg total) by mouth daily. 30 tablet 3   potassium chloride 20 MEQ/15ML (10%) SOLN Take 15 mLs (20 mEq total) by mouth daily. (Patient taking differently: Take 20 mEq by mouth daily. 7 Mls daily) 1350 mL 3   No current facility-administered medications for this visit.    Allergies  Allergen Reactions   Amlodipine Besy-Benazepril Hcl Cough   Hctz [Hydrochlorothiazide] Other (See Comments)    Per family, every diuretic the patient has been on flushes out her out and ultimately results in CRAMPING/JAUNDICE (added potassium might help)   Other Other (See Comments)    History of diverticulitis- CANNOT TOLERATE NUTS, CORN, AND ANY FOODS NOT EASILY  DIGESTED- the patient AVOIDS these   Cyclobenzaprine Other (See Comments)    Confusion- does not tolerate well    Diclofenac Sodium Diarrhea   Hydralazine Other (See Comments)    Per family, every diuretic the patient has been on flushes out her out and ultimately results in CRAMPING/JAUNDICE (added potassium might help)   Methylprednisolone Other (See Comments)    Medrol/CORTICOSTEROIDS = A LOT OF ENERGY   Oxycodone Other (See Comments)    Patient becomes very disorientated     Social History   Socioeconomic History   Marital status: Widowed    Spouse name: Not on file   Number of children: 2   Years of education: Not on file   Highest education level: High school graduate  Occupational History   Occupation: Retired    Comment: homemaker  Tobacco Use   Smoking  status: Former    Packs/day: 1.00    Years: 70.00    Total pack years: 70.00    Types: Cigarettes    Quit date: 12/15/2019    Years since quitting: 2.2   Smokeless tobacco: Never   Tobacco comments:    Pt states quiting a year ago. 01/03/20 ARJ  Vaping Use   Vaping Use: Never used  Substance and Sexual Activity   Alcohol use: No    Alcohol/week: 0.0 standard drinks of alcohol   Drug use: No   Sexual activity: Not Currently  Other Topics Concern   Not on file  Social History Narrative   Widowed- was married for 41 years    Has two sons       Social Determinants of Health   Financial Resource Strain: Low Risk  (12/19/2020)   Overall Financial Resource Strain (CARDIA)    Difficulty of Paying Living Expenses: Not hard at all  Food Insecurity: No Food Insecurity (03/17/2021)   Hunger Vital Sign    Worried About Running Out of Food in the Last Year: Never true    [place] in the Last Year: Never true  Transportation Needs: No Transportation Needs (03/17/2021)   PRAPARE - Hydrologist (Medical): No    Lack of Transportation (Non-Medical): No  Physical Activity: Insufficiently  Active (03/17/2021)   Exercise Vital Sign    Days of Exercise per Week: 5 days    Minutes of Exercise per Session: 10 min  Stress: No Stress Concern Present (03/17/2021)   [place]    Feeling of Stress : Not at all  Social Connections: Socially Isolated (03/17/2021)   Social Connection and Isolation Panel [NHANES]    Frequency of Communication with Friends and Family: Three times a week    Frequency of Social Gatherings with Friends and Family: Three times a week    Attends Religious Services: Never    Active Member of Clubs or Organizations: No    Attends Archivist Meetings: Never    Marital Status: Widowed  Intimate Partner Violence: Not At Risk (03/17/2021)   Humiliation, Afraid, Rape, and Kick questionnaire    Fear of Current or Ex-Partner: No    Emotionally Abused: No    Physically Abused: No    Sexually Abused: No    Family History  Problem Relation Age of Onset   Diabetes Mother    Stroke Brother    Cerebral palsy Brother    Coronary artery disease Neg Hx     Review of Systems:  As stated in the HPI and otherwise negative.   BP 106/60   Pulse 73   Ht 5' (1.524 m)   Wt 135 lb (61.2 kg)   SpO2 98%   BMI 26.37 kg/m   Physical Examination: General: Well developed, well nourished, NAD  HEENT: OP clear, mucus membranes moist  SKIN: warm, dry. No rashes. Neuro: No focal deficits  Musculoskeletal: Muscle strength 5/5 all ext  Psychiatric: Mood and affect normal  Neck: No JVD, no carotid bruits, no thyromegaly, no lymphadenopathy.  Lungs:Clear bilaterally, no wheezes, rhonci, crackles Cardiovascular: Regular rate and rhythm. Soft systolic murmur.  Abdomen:Soft. Bowel sounds present. Non-tender.  Extremities: No lower extremity edema. Pulses are 2 + in the bilateral DP/PT.  EKG:  EKG is not ordered today. The ekg ordered today demonstrates   Echo 11/09/21:  1. S/P TAVR with mild perivalvular  AI; no  change compared to previous.   2. Left ventricular ejection fraction, by estimation, is 20 to 25%. The  left ventricle has severely decreased function. The left ventricle  demonstrates global hypokinesis. The left ventricular internal cavity size  was severely dilated. Left ventricular  diastolic function could not be evaluated.   3. Right ventricular systolic function is normal. The right ventricular  size is normal. There is moderately elevated pulmonary artery systolic  pressure.   4. Left atrial size was severely dilated.   5. Right atrial size was severely dilated.   6. The mitral valve is abnormal. Mild mitral valve regurgitation. No  evidence of mitral stenosis.   7. The aortic valve has been repaired/replaced. Aortic valve  regurgitation is mild. No aortic stenosis is present. There is a 26 mm  Sapien prosthetic (TAVR) valve present in the aortic position. Procedure   Recent Labs: 06/07/2021: TSH 0.692 11/08/2021: B Natriuretic Peptide 1,638.3 11/12/2021: Magnesium 2.3 12/02/2021: ALT 16; BUN 33; Creatinine, Ser 1.06; Hemoglobin 11.5; Platelets 233.0; Potassium 4.6; Sodium 138   Lipid Panel    Component Value Date/Time   CHOL 94 05/08/2021 0234   TRIG 57 05/08/2021 0234   HDL 34 (L) 05/08/2021 0234   CHOLHDL 2.8 05/08/2021 0234   VLDL 11 05/08/2021 0234   LDLCALC 49 05/08/2021 0234     Wt Readings from Last 3 Encounters:  03/17/22 135 lb (61.2 kg)  02/03/22 137 lb (62.1 kg)  12/30/21 (P) 139 lb (63 kg)     Other studies Reviewed: Additional studies/ records that were reviewed today include:  Review of the above records demonstrates:    Assessment and Plan:   1. Severe aortic stenosis s/p TAVR: F/u at [place] [facility] July 2021 with Echo showing LVEF=45-50%, unchanged but echo May 2022 at [facility] with LVEF=25-30%. Most recent echo March 2023 with LVEF=20-25%. We have not planned an ischemic evaluation given advanced age. We have discussed this and she does not wish to  have any ischemic evaluation. Continue Eliquis and SBE prophylaxis.  Will stop ASA  2. CAD without angina: No chest pain. Mild to moderate CAD by cath in 2019. Drop in LVEF noted on echo May 2022 with LVEF=25-30%. Given her age, we will not plan ischemic testing in the future. Will continue statin and beta blocker. LDL 49 in September 2022.   3. Paroxysmal atrial fibrillation: Atrial fib today on exam with good rate control Will continue beta blocker and Eliquis.     4. HTN: BP is controlled. No changes  5. Carotid artery disease: Notes indicate right carotid artery stenting. She denies this. No plans for further surveillance given age.   6. Tobacco abuse: She has stopped smoking  7. Non-ischemic cardiomypathy/Chronic systolic CHF: LVEF is 12%. Weight stable. No LE edema on exam. Continue torsemide, beta blocker and aldactone.  NO ARB or Ace-inh due to renal insufficiency. Check BMET today.   Current medicines are reviewed at length with the patient today.  The patient does not have concerns regarding medicines.  The following changes have been made:  no change  Labs/ tests ordered today include:   Orders Placed This Encounter  Procedures   Basic Metabolic Panel (BMET)   Disposition:   F/U with me or office APP in 6 months.   Signed, Lauree Chandler, MD 03/17/2022 12:08 PM    [facility] [address], [place], [place]  [address] Phone: [phone]; Fax: [phone]

## 2022-03-17 NOTE — Patient Instructions (Addendum)
Medication Instructions:  Your physician has recommended you make the following change in your medication:  1.) stop aspirin  *If you need a refill on your cardiac medications before your next appointment, please call your pharmacy*   Lab Work: Today: BMET  If you have labs (blood work) drawn today and your tests are completely normal, you will receive your results only by: [place] (if you have MyChart) OR A paper copy in the mail If you have any lab test that is abnormal or we need to change your treatment, we will call you to review the results.   Testing/Procedures: none   Follow-Up: At [facility], you and your health needs are our priority.  As part of our continuing mission to provide you with exceptional heart care, we have created designated Provider Care Teams.  These Care Teams include your primary Cardiologist (physician) and Advanced Practice Providers (APPs -  Physician Assistants and Nurse Practitioners) who all work together to provide you with the care you need, when you need it.   Your next appointment:   6 month(s)  The format for your next appointment:   In Person  Provider:   Lauree Chandler, MD     Important Information About Sugar

## 2022-03-19 ENCOUNTER — Other Ambulatory Visit: Payer: Self-pay | Admitting: Adult Health

## 2022-03-19 DIAGNOSIS — I5043 Acute on chronic combined systolic (congestive) and diastolic (congestive) heart failure: Secondary | ICD-10-CM

## 2022-03-31 ENCOUNTER — Other Ambulatory Visit: Payer: Medicare Other | Admitting: Family Medicine

## 2022-03-31 VITALS — BP 112/58 | HR 78 | Resp 18

## 2022-03-31 DIAGNOSIS — R198 Other specified symptoms and signs involving the digestive system and abdomen: Secondary | ICD-10-CM

## 2022-03-31 DIAGNOSIS — N1832 Chronic kidney disease, stage 3b: Secondary | ICD-10-CM

## 2022-03-31 DIAGNOSIS — I5022 Chronic systolic (congestive) heart failure: Secondary | ICD-10-CM

## 2022-03-31 NOTE — Progress Notes (Unsigned)
[facility] Palliative Care Consult Note Telephone: [phone]  Fax: [phone]    Date of encounter: 03/31/22 10:43 AM PATIENT NAME: Tara Fields [place] [address]   [phone] (home)  DOB: 1927/09/18 MRN: [id number] PRIMARY CARE PROVIDER:    [name], NP,  [place] [address] [phone]  REFERRING PROVIDER:   [name], NP [place],   [address] [phone]  RESPONSIBLE PARTY:    Contact Information     Name Relation Home Work Mobile   Alexa,Randy Son [phone]          I met face to face with patient and son in her independent living facility. Palliative Care was asked to follow this patient by consultation request of  Nafziger, Tommi Rumps, NP to address advance care planning and complex medical decision making. This is a follow up visit.                                   ASSESSMENT, SYMPTOM MANAGEMENT AND PLAN / RECOMMENDATIONS: Acute cystitis without hematuria Completed antibiotics and symptoms resolved. Encouraged prevention with intake of Cranberry daily and water.  2.  Difficulty swallowing larger pills Doing well with change to liquid potassium No evidence of aspiration  3. Chronic combined systolic and diastolic heart failure Stable, appears euvolemic with stable weight and no edema DOE/SOB at baseline Continue Toprol XL 50 mg BID, ASA 81 mg daily, Spironolactone 12.5 mg daily, Torsemide 40 mg daily with potassium supplement and Atorvastatin 40 mg nightly Daily weights with notification if weight increases 2-3 lbs/day or 5 lbs in 1 week.     Advance Care Planning/Goals of Care: Goals include to maximize quality of life and symptom management.  CODE STATUS: MOST as of 12/30/21: DNR/DNI with limited additional intervention Antibiotics if indicated  IV fluids and feeding tube on a case by case, time limited  basis    Follow up Palliative Care Visit: Palliative care will continue to follow for complex medical decision making, advance care planning, and clarification of goals. Return 6 weeks or prn.    This visit was coded based on medical decision making (MDM).  PPS: 50%  HOSPICE ELIGIBILITY/DIAGNOSIS: TBD  Chief Complaint:   Palliative Care is following for medical management of chronic combined systolic and diastolic heart failure, COPD with recent UTI.    HISTORY OF PRESENT ILLNESS:  Tara Fields is a 88 y.o. year old female with chronic combined systolic and diastolic heart failure, COPD, CAD, aortic stenosis status post TAVR, bilateral carotid artery stenosis, hypertension, persistent atrial fibrillation, GERD, history of GI bleed, diabetes, hyperlipidemia.,  OA of multiple joints, CKD, diverticulitis, vitamin D deficiency, cognitive deficit, anemia, leg cramps, malnutrition, pain of upper abdomen and difficulty swallowing pills so she has changed to liquid potassium. Has been having injections of steroids or artificial synvisc in her knee following aspiration but Dr Thedore Mins has moved away and the orthopedist who has taken his place has not been able to follow up with her.  Denies dysuria. Denies falls.  Not sleeping well but this is a chronic problem.  She states she thought she was getting a cold last week.  Appetite is good. Weight is stable.  Eating sugar free ice cream.  She was taken off ASA.  She was due for Cardiology follow up and was seen. She is using Metamucil fiber gummies for constipation.  Denies palpitations, CP, has some DOE.  Last hospitalization taken off Losartan and was started on Aldactone.   History obtained from review of EMR, discussion with son and/or Ms. Coupland.  I reviewed available labs, medications, imaging, studies and related documents from the EMR.  Records reviewed and summarized above.   ROS General: NAD ENMT: denies  dysphagia Cardiovascular: denies chest pain, endorses DOE Pulmonary: denies cough, denies increased SOB Abdomen: endorses good appetite, constipation managed with gummies, endorses continence of bowel GU: denies dysuria, endorses incontinence of urine MSK:  denies increased weakness, bilateral knee pain, no falls reported Skin: denies rashes or wounds Neurological: Continues having difficulty falling asleep with it being light outside Psych: Endorses positive mood   Physical Exam: Current and past weights: 137 lbs on 02/03/22, 139 lbs on  12/30/21 Constitutional: NAD General: WN,WD CV: S1S2, IRIR, left pedal edema Pulmonary: CTAB, no increased work of breathing, no cough, room air Abdomen: normo-active BS + 4 quadrants, soft and non tender, no ascites GU: deferred MSK: no sarcopenia, moves all extremities, ambulatory with rollator Skin: warm and dry, no rashes or wounds on visible skin Neuro:  no generalized weakness,  no cognitive impairment Psych: non-anxious affect, A and O x 3   Thank you for the opportunity to participate in the care of Ms. Hakes.  The palliative care team will continue to follow. Please call our office at [phone] if we can be of additional assistance.   Marijo Conception, FNP -C  COVID-19 PATIENT SCREENING TOOL Asked and negative response unless otherwise noted:   Have you had symptoms of covid, tested positive or been in contact with someone with symptoms/positive test in the past 5-10 days? No

## 2022-04-01 ENCOUNTER — Encounter: Payer: Self-pay | Admitting: Family Medicine

## 2022-04-02 ENCOUNTER — Other Ambulatory Visit: Payer: Self-pay | Admitting: Adult Health

## 2022-04-02 DIAGNOSIS — I5043 Acute on chronic combined systolic (congestive) and diastolic (congestive) heart failure: Secondary | ICD-10-CM

## 2022-04-07 ENCOUNTER — Encounter: Payer: Self-pay | Admitting: Family Medicine

## 2022-04-07 ENCOUNTER — Ambulatory Visit (INDEPENDENT_AMBULATORY_CARE_PROVIDER_SITE_OTHER): Payer: Medicare Other | Admitting: Family Medicine

## 2022-04-07 VITALS — BP 111/60 | Ht 60.0 in | Wt 136.0 lb

## 2022-04-07 DIAGNOSIS — M25562 Pain in left knee: Secondary | ICD-10-CM

## 2022-04-07 DIAGNOSIS — M25561 Pain in right knee: Secondary | ICD-10-CM | POA: Diagnosis present

## 2022-04-07 MED ORDER — METHYLPREDNISOLONE ACETATE 40 MG/ML IJ SUSP
40.0000 mg | Freq: Once | INTRAMUSCULAR | Status: AC
  Administered 2022-04-08: 40 mg via INTRA_ARTICULAR

## 2022-04-08 ENCOUNTER — Encounter: Payer: Self-pay | Admitting: Family Medicine

## 2022-04-08 DIAGNOSIS — M25562 Pain in left knee: Secondary | ICD-10-CM | POA: Diagnosis not present

## 2022-04-08 DIAGNOSIS — M25561 Pain in right knee: Secondary | ICD-10-CM | POA: Diagnosis present

## 2022-04-08 NOTE — Progress Notes (Signed)
PCP: [name], NP  Subjective:   HPI: Patient is a 88 y.o. female here for bilateral knee injections.  Patient referred from Dr. Thedore Mins at [place] for ultrasound guided knee injections, possible aspirations. She has had these done before with good relief of her bilateral knee pain from osteoarthritis.  Past Medical History:  Diagnosis Date   Acute exacerbation of CHF (congestive heart failure) ([place]) 12/18/2020   AKI (acute kidney injury) ([place])    Aortic stenosis    s/p TAVR 26 mm Edwards Sapien 3 THV July 2020   CAD (coronary artery disease)    COPD (chronic obstructive pulmonary disease) (H[facility])    Diabetes mellitus (HCC)    Diverticulitis    GERD (gastroesophageal reflux disease)    HTN (hypertension)    Hyperlipidemia    Legally blind in left eye, as defined in [place]    Tobacco abuse    UTI (urinary tract infection) 11/10/2021    Current Outpatient Medications on File Prior to Visit  Medication Sig Dispense Refill   acetaminophen (TYLENOL) 500 MG tablet Take 500 mg by mouth 3 (three) times daily as needed for moderate pain. Take with Tramadol     atorvastatin (LIPITOR) 40 MG tablet TAKE 1 TABLET BY MOUTH DAILY AT 6 PM. 90 tablet 1   cholecalciferol (VITAMIN D3) 25 MCG (1000 UNIT) tablet Take 1,000 Units by mouth 2 (two) times daily.     Coenzyme Q10 (CO Q-10) 300 MG CAPS Take 300 mg by mouth in the morning.     ELIQUIS 5 MG TABS tablet TAKE 1 TABLET BY MOUTH TWICE A DAY (Patient taking differently: Take 5 mg by mouth 2 (two) times daily.) 180 tablet 1   Ferrous Gluconate 324 (37.5 Fe) MG TABS Take 1 tablet (324 mg total) by mouth daily with breakfast. (Patient taking differently: Take 324 mg by mouth daily with breakfast. Every other day) 90 tablet 3   Lidocaine-Menthol, Spray, 4-1 % LIQD Apply 1 spray topically daily as needed (back/knee pain/restless legs).     Magnesium 200 MG TABS Take 200 mg by mouth every evening.     metoprolol succinate (TOPROL-XL) 50 MG 24  hr tablet TAKE 1 TABLET (50 MG TOTAL) BY MOUTH TWICE A DAY .TAKE WITH OR IMMEDIATELY FOLLOWING A MEAL 180 tablet 0   Multiple Vitamin (MULTIVITAMIN WITH MINERALS) TABS tablet Take 1 tablet by mouth 2 (two) times daily. Centrum Minis     potassium chloride 20 MEQ/15ML (10%) SOLN Take 15 mLs (20 mEq total) by mouth daily. (Patient taking differently: Take 20 mEq by mouth daily. 7 Mls daily) 1350 mL 3   psyllium (METAMUCIL) 58.6 % powder Take 1 packet by mouth daily as needed (for constipation).     spironolactone (ALDACTONE) 25 MG tablet TAKE 1/2 TABLET BY MOUTH EVERY DAY 45 tablet 0   terazosin (HYTRIN) 10 MG capsule Take 1 capsule (10 mg total) by mouth at bedtime. 90 capsule 1   torsemide (DEMADEX) 20 MG tablet Take 2 tablets (40 mg total) by mouth daily. 180 tablet 0   traMADol (ULTRAM) 50 MG tablet Take 1 tablet (50 mg total) by mouth 3 (three) times daily. 90 tablet 2   vitamin B-12 (CYANOCOBALAMIN) 1000 MCG tablet Take 1 tablet (1,000 mcg total) by mouth daily. 30 tablet 3   No current facility-administered medications on file prior to visit.    Past Surgical History:  Procedure Laterality Date   ABDOMINAL HYSTERECTOMY     APPENDECTOMY     BACK  SURGERY     CATARACT EXTRACTION, BILATERAL     CHOLECYSTECTOMY     COLONOSCOPY WITH PROPOFOL N/A 04/16/2021   Procedure: COLONOSCOPY WITH PROPOFOL;  Surgeon: Milus Banister, MD;  Location: [facility];  Service: Gastroenterology;  Laterality: N/A;   ESOPHAGOGASTRODUODENOSCOPY (EGD) WITH PROPOFOL N/A 04/15/2021   Procedure: ESOPHAGOGASTRODUODENOSCOPY (EGD) WITH PROPOFOL;  Surgeon: Doran Stabler, MD;  Location: [facility];  Service: Gastroenterology;  Laterality: N/A;    Allergies  Allergen Reactions   Amlodipine Besy-Benazepril Hcl Cough   Hctz [Hydrochlorothiazide] Other (See Comments)    Per family, every diuretic the patient has been on flushes out her out and ultimately results in CRAMPING/JAUNDICE (added potassium might help)    Other Other (See Comments)    History of diverticulitis- CANNOT TOLERATE NUTS, CORN, AND ANY FOODS NOT EASILY DIGESTED- the patient AVOIDS these   Cyclobenzaprine Other (See Comments)    Confusion- does not tolerate well    Diclofenac Sodium Diarrhea   Hydralazine Other (See Comments)    Per family, every diuretic the patient has been on flushes out her out and ultimately results in CRAMPING/JAUNDICE (added potassium might help)   Methylprednisolone Other (See Comments)    Medrol/CORTICOSTEROIDS = A LOT OF ENERGY   Oxycodone Other (See Comments)    Patient becomes very disorientated     BP 111/60   Ht 5' (1.524 m)   Wt 136 lb (61.7 kg)   BMI 26.56 kg/m       No data to display              No data to display              Objective:  Physical Exam:  Gen: NAD, comfortable in exam room in wheelchair.  Bilateral knees: Mild effusion.  No other gross deformity. ROM 0 - 90 degrees. NVI distally.   Assessment & Plan:  1. Bilateral knee pain - 2/2 osteoarthritis.  Limited ultrasound performed prior to injections showing very small effusions - discussed limited benefit in draining these given size currently.  Ultrasound guided injections performed below.  Follow up at [facility].  After informed written consent timeout was performed, patient was seated in wheelchair. Right knee was prepped with alcohol swab and utilizing superolateral approach with ultrasound guidance, patient's right knee was injected intraarticularly with 3:1 lidocaine: depomedrol. Patient tolerated the procedure well without immediate complications.  After informed written consent timeout was performed, patient was seated in wheelchair. Left knee was prepped with alcohol swab and utilizing superolateral approach with ultrasound guidance, patient's left knee was injected intraarticularly with 3:1 lidocaine: depomedrol. Patient tolerated the procedure well without immediate complications.

## 2022-04-20 ENCOUNTER — Telehealth: Payer: Self-pay | Admitting: Family Medicine

## 2022-04-20 DIAGNOSIS — R399 Unspecified symptoms and signs involving the genitourinary system: Secondary | ICD-10-CM

## 2022-04-20 NOTE — Telephone Encounter (Signed)
Received call from pt's son Louie Casa that pt was acting again like she had a UTI with increased confusion and urinary frequency.  He states no dysuria or fever.  Flowood worked well for her previously with no adverse effects.  Prescription for Omnicef 300 mg BID x 5 days sent to [facility].    Damaris Hippo, FNP-C

## 2022-04-22 ENCOUNTER — Telehealth: Payer: Self-pay

## 2022-04-22 NOTE — Telephone Encounter (Signed)
(  2:45 pm) Patient's son-Randy called in to obtain resources for in-home care for patient. Louie Casa states that he needs more help with patient. SW provided 6 resources for in-home care agencies and private-duty caregivers. SW also discussed [employer] with him also. He advised that he will reach out to the resources provided. SW extended ongoing support and encouraged him to call with any additional questions or concerns.

## 2022-04-23 ENCOUNTER — Encounter (HOSPITAL_BASED_OUTPATIENT_CLINIC_OR_DEPARTMENT_OTHER): Payer: Self-pay

## 2022-04-23 ENCOUNTER — Other Ambulatory Visit: Payer: Self-pay

## 2022-04-23 ENCOUNTER — Emergency Department (HOSPITAL_BASED_OUTPATIENT_CLINIC_OR_DEPARTMENT_OTHER): Payer: Medicare Other

## 2022-04-23 ENCOUNTER — Emergency Department (HOSPITAL_BASED_OUTPATIENT_CLINIC_OR_DEPARTMENT_OTHER)
Admission: EM | Admit: 2022-04-23 | Discharge: 2022-04-23 | Disposition: A | Discharge status: Home / Self Care | Payer: Medicare Other | Attending: Emergency Medicine | Admitting: Emergency Medicine

## 2022-04-23 DIAGNOSIS — R519 Headache, unspecified: Secondary | ICD-10-CM | POA: Diagnosis not present

## 2022-04-23 DIAGNOSIS — R0789 Other chest pain: Secondary | ICD-10-CM | POA: Diagnosis not present

## 2022-04-23 DIAGNOSIS — I10 Essential (primary) hypertension: Secondary | ICD-10-CM | POA: Insufficient documentation

## 2022-04-23 DIAGNOSIS — J449 Chronic obstructive pulmonary disease, unspecified: Secondary | ICD-10-CM | POA: Insufficient documentation

## 2022-04-23 DIAGNOSIS — S40011A Contusion of right shoulder, initial encounter: Secondary | ICD-10-CM

## 2022-04-23 DIAGNOSIS — E119 Type 2 diabetes mellitus without complications: Secondary | ICD-10-CM | POA: Diagnosis not present

## 2022-04-23 DIAGNOSIS — W1839XA Other fall on same level, initial encounter: Secondary | ICD-10-CM | POA: Diagnosis not present

## 2022-04-23 DIAGNOSIS — Z79899 Other long term (current) drug therapy: Secondary | ICD-10-CM | POA: Insufficient documentation

## 2022-04-23 DIAGNOSIS — I2510 Atherosclerotic heart disease of native coronary artery without angina pectoris: Secondary | ICD-10-CM | POA: Insufficient documentation

## 2022-04-23 DIAGNOSIS — R1010 Upper abdominal pain, unspecified: Secondary | ICD-10-CM | POA: Insufficient documentation

## 2022-04-23 DIAGNOSIS — Z7901 Long term (current) use of anticoagulants: Secondary | ICD-10-CM | POA: Diagnosis not present

## 2022-04-23 DIAGNOSIS — S4991XA Unspecified injury of right shoulder and upper arm, initial encounter: Secondary | ICD-10-CM | POA: Diagnosis present

## 2022-04-23 DIAGNOSIS — W19XXXA Unspecified fall, initial encounter: Secondary | ICD-10-CM

## 2022-04-23 LAB — CBC WITH DIFFERENTIAL/PLATELET
Abs Immature Granulocytes: 0.02 10*3/uL (ref 0.00–0.07)
Basophils Absolute: 0 10*3/uL (ref 0.0–0.1)
Basophils Relative: 0 %
Eosinophils Absolute: 0.2 10*3/uL (ref 0.0–0.5)
Eosinophils Relative: 3 %
HCT: 34.6 % — ABNORMAL LOW (ref 36.0–46.0)
Hemoglobin: 11.8 g/dL — ABNORMAL LOW (ref 12.0–15.0)
Immature Granulocytes: 0 %
Lymphocytes Relative: 9 %
Lymphs Abs: 0.6 10*3/uL — ABNORMAL LOW (ref 0.7–4.0)
MCH: 31 pg (ref 26.0–34.0)
MCHC: 34.1 g/dL (ref 30.0–36.0)
MCV: 90.8 fL (ref 80.0–100.0)
Monocytes Absolute: 0.7 10*3/uL (ref 0.1–1.0)
Monocytes Relative: 9 %
Neutro Abs: 5.8 10*3/uL (ref 1.7–7.7)
Neutrophils Relative %: 79 %
Platelets: 199 10*3/uL (ref 150–400)
RBC: 3.81 MIL/uL — ABNORMAL LOW (ref 3.87–5.11)
RDW: 14.7 % (ref 11.5–15.5)
WBC: 7.4 10*3/uL (ref 4.0–10.5)
nRBC: 0 % (ref 0.0–0.2)

## 2022-04-23 LAB — COMPREHENSIVE METABOLIC PANEL
ALT: 14 U/L (ref 0–44)
AST: 21 U/L (ref 15–41)
Albumin: 4.2 g/dL (ref 3.5–5.0)
Alkaline Phosphatase: 64 U/L (ref 38–126)
Anion gap: 13 (ref 5–15)
BUN: 57 mg/dL — ABNORMAL HIGH (ref 8–23)
CO2: 25 mmol/L (ref 22–32)
Calcium: 9.4 mg/dL (ref 8.9–10.3)
Chloride: 98 mmol/L (ref 98–111)
Creatinine, Ser: 1.2 mg/dL — ABNORMAL HIGH (ref 0.44–1.00)
GFR, Estimated: 42 mL/min — ABNORMAL LOW (ref >60)
Glucose, Bld: 131 mg/dL — ABNORMAL HIGH (ref 70–99)
Potassium: 4.1 mmol/L (ref 3.5–5.1)
Sodium: 136 mmol/L (ref 135–145)
Total Bilirubin: 1 mg/dL (ref 0.3–1.2)
Total Protein: 7.2 g/dL (ref 6.5–8.1)

## 2022-04-23 MED ORDER — IOHEXOL 300 MG/ML  SOLN
50.0000 mL | Freq: Once | INTRAMUSCULAR | Status: AC | PRN
  Administered 2022-04-23: 50 mL via INTRAVENOUS

## 2022-04-23 MED ORDER — SODIUM CHLORIDE 0.9 % IV BOLUS
500.0000 mL | Freq: Once | INTRAVENOUS | Status: AC
  Administered 2022-04-23: 500 mL via INTRAVENOUS

## 2022-04-23 MED ORDER — ACETAMINOPHEN 500 MG PO TABS
500.0000 mg | ORAL_TABLET | Freq: Once | ORAL | Status: AC
  Administered 2022-04-23: 500 mg via ORAL
  Filled 2022-04-23: qty 1

## 2022-04-23 NOTE — ED Provider Notes (Signed)
[facility] EMERGENCY DEPT Provider Note   CSN: [id number] Arrival date & time: 04/23/22  1711     History {Add pertinent medical, surgical, social history, OB history to HPI:1} Chief Complaint  Patient presents with   Tara Fields is a 88 y.o. female.   Fall  Patient presents after fall.  Reportedly fell yesterday.  Mechanical.  Complaining of right side pain.  No loss of consciousness.  States abdomen feels a little bloated.  No fevers or chills.  No coughing.  Reportedly is being treated for UTI.  Is on Eliquis.    Past Medical History:  Diagnosis Date   Acute exacerbation of CHF (congestive heart failure) ([place]) 12/18/2020   AKI (acute kidney injury) ([place])    Aortic stenosis    s/p TAVR 26 mm Edwards Sapien 3 THV July 2020   CAD (coronary artery disease)    COPD (chronic obstructive pulmonary disease) ([facility])    Diabetes mellitus ([facility])    Diverticulitis    GERD (gastroesophageal reflux disease)    HTN (hypertension)    Hyperlipidemia    Legally blind in left eye, as defined in [place]    Tobacco abuse    UTI (urinary tract infection) 11/10/2021    Home Medications Prior to Admission medications   Medication Sig Start Date End Date Taking? Authorizing Provider  acetaminophen (TYLENOL) 500 MG tablet Take 500 mg by mouth 3 (three) times daily as needed for moderate pain. Take with Tramadol    [provider]  atorvastatin (LIPITOR) 40 MG tablet TAKE 1 TABLET BY MOUTH DAILY AT 6 PM. 02/19/22   Nafziger, Tommi Rumps, NP  cholecalciferol (VITAMIN D3) 25 MCG (1000 UNIT) tablet Take 1,000 Units by mouth 2 (two) times daily.    [provider]  Coenzyme Q10 (CO Q-10) 300 MG CAPS Take 300 mg by mouth in the morning.    [provider]  ELIQUIS 5 MG TABS tablet TAKE 1 TABLET BY MOUTH TWICE A DAY Patient taking differently: Take 5 mg by mouth 2 (two) times daily. 10/27/21   Nafziger, Tommi Rumps, NP  Ferrous Gluconate 324 (37.5 Fe) MG TABS Take 1  tablet (324 mg total) by mouth daily with breakfast. Patient taking differently: Take 324 mg by mouth daily with breakfast. Every other day 09/09/21   Nafziger, Tommi Rumps, NP  Lidocaine-Menthol, Spray, 4-1 % LIQD Apply 1 spray topically daily as needed (back/knee pain/restless legs).    [provider]  Magnesium 200 MG TABS Take 200 mg by mouth every evening.    [provider]  metoprolol succinate (TOPROL-XL) 50 MG 24 hr tablet TAKE 1 TABLET (50 MG TOTAL) BY MOUTH TWICE A DAY .TAKE WITH OR IMMEDIATELY FOLLOWING A MEAL 04/05/22   Nafziger, Tommi Rumps, NP  Multiple Vitamin (MULTIVITAMIN WITH MINERALS) TABS tablet Take 1 tablet by mouth 2 (two) times daily. Centrum Minis    [provider]  potassium chloride 20 MEQ/15ML (10%) SOLN Take 15 mLs (20 mEq total) by mouth daily. Patient taking differently: Take 20 mEq by mouth daily. 7 Mls daily 05/19/21 11/09/21  [name], PA-C  psyllium (METAMUCIL) 58.6 % powder Take 1 packet by mouth daily as needed (for constipation).    [provider]  spironolactone (ALDACTONE) 25 MG tablet TAKE 1/2 TABLET BY MOUTH EVERY DAY 03/19/22   Nafziger, Tommi Rumps, NP  terazosin (HYTRIN) 10 MG capsule Take 1 capsule (10 mg total) by mouth at bedtime. 01/04/22   [name], NP  torsemide [facility])  20 MG tablet Take 2 tablets (40 mg total) by mouth daily. 12/31/21   Nafziger, Tommi Rumps, NP  vitamin B-12 (CYANOCOBALAMIN) 1000 MCG tablet Take 1 tablet (1,000 mcg total) by mouth daily. 04/16/21   Oswald Hillock, MD      Allergies    Amlodipine besy-benazepril hcl, Hctz [hydrochlorothiazide], Other, Cyclobenzaprine, Diclofenac sodium, Hydralazine, Methylprednisolone, and Oxycodone    Review of Systems   Review of Systems  Physical Exam Updated Vital Signs BP 111/60   Pulse 80   Temp 98.1 F (36.7 C)   Resp 13   Ht 5' (1.524 m)   Wt 61.7 kg   SpO2 95%   BMI 26.56 kg/m  Physical Exam Vitals and nursing note reviewed.  HENT:     Head:  Normocephalic.  Cardiovascular:     Rate and Rhythm: Rhythm irregular.  Pulmonary:     Comments: Some tenderness to right chest wall.  No crepitance or deformity. Abdominal:     Comments: Mild upper abdominal tenderness without rebound or guarding.  No hernias palpated.  Musculoskeletal:        General: Tenderness present.     Cervical back: Neck supple.     Comments: Tenderness to proximal humerus on right side superiorly.  Mild ecchymosis.  No elbow tenderness.  Neurovascular intact distally.  Skin:    Capillary Refill: Capillary refill takes less than 2 seconds.  Neurological:     Mental Status: She is alert and oriented to person, place, and time.     ED Results / Procedures / Treatments   Labs (all labs ordered are listed, but only abnormal results are displayed) Labs Reviewed  CBC WITH DIFFERENTIAL/PLATELET - Abnormal; Notable for the following components:      Result Value   RBC 3.81 (*)    Hemoglobin 11.8 (*)    HCT 34.6 (*)    Lymphs Abs 0.6 (*)    All other components within normal limits  COMPREHENSIVE METABOLIC PANEL - Abnormal; Notable for the following components:   Glucose, Bld 131 (*)    BUN 57 (*)    Creatinine, Ser 1.20 (*)    GFR, Estimated 42 (*)    All other components within normal limits    EKG EKG Interpretation  Date/Time:  Friday April 23 2022 17:47:19 EDT Ventricular Rate:  90 PR Interval:    QRS Duration: 173 QT Interval:  444 QTC Calculation: 544 R Axis:   -78 Text Interpretation: Atrial fibrillation Left bundle branch block No significant change since last tracing Confirmed by Davonna Belling [phone]) on 04/23/2022 6:47:50 PM  Radiology CT CHEST ABDOMEN PELVIS W CONTRAST  Result Date: 04/23/2022 CLINICAL DATA:  Trauma, fall EXAM: CT CHEST, ABDOMEN, AND PELVIS WITH CONTRAST TECHNIQUE: Multidetector CT imaging of the chest, abdomen and pelvis was performed following the standard protocol during bolus administration of intravenous  contrast. RADIATION DOSE REDUCTION: This exam was performed according to the departmental dose-optimization program which includes automated exposure control, adjustment of the mA and/or kV according to patient size and/or use of iterative reconstruction technique. CONTRAST:  7mL OMNIPAQUE IOHEXOL 300 MG/ML  SOLN COMPARISON:  CT chest done on 03/04/2021, chest radiographs done on 04/23/2022 FINDINGS: CT CHEST FINDINGS Cardiovascular: There is homogeneous enhancement in thoracic aorta. There is prosthetic stent in the aortic valve. There calcifications in thoracic aorta and its major branches. There is possible high-grade stenosis in the proximal left subclavian. Coronary artery calcifications are seen. Heart is enlarged in size. There are no filling defects in  central pulmonary artery branches. There is ectasia of main pulmonary artery measuring 3.8 cm suggesting pulmonary arterial hypertension. Mediastinum/Nodes: There is no demonstrable mediastinal hematoma. There are slightly enlarged lymph nodes in mediastinum with no significant change. Lungs/Pleura: There is small patchy infiltrate in the medial left upper lung field in left upper lobe. There is mild ectasia of bronchi in this region. There is patchy infiltrate in superior segment of left lower lobe with possible interval worsening. Centrilobular emphysema is seen. Small linear densities are seen in both lower lung fields. There is a small loculated effusion in the posterior left mid lung field. There is no pneumothorax. Musculoskeletal: No acute findings are seen in bony structures of thorax. Degenerative changes are noted in both shoulders. CT ABDOMEN PELVIS FINDINGS Hepatobiliary: There is fatty infiltration in liver. Surgical clips are seen in gallbladder fossa. There is dilation of extrahepatic bile ducts distal common bile duct in the head of the pancreas measures 11 x 14 mm. There is no dilation of intrahepatic bile ducts. Pancreas: No focal  abnormalities are seen. Spleen: Unremarkable. Adrenals/Urinary Tract: There is 1.9 cm nodule in left adrenal which has not changed significantly. In the previous noncontrast study, density measurement was 5 Hounsfield units. Overall size of the nodule has not changed. Right adrenal is unremarkable. There is no hydronephrosis. There is a 3 mm calculus in the lower pole of left kidney. There is no demonstrable cortical laceration. Urinary bladder is distended. There is no wall thickening. Stomach/Bowel: Stomach is not distended. Small bowel loops are not dilated. Appendix is not seen. There is no pericecal inflammation. There is no significant wall thickening in colon. There is ventral hernia containing small portion of transverse colon immediately superior to the umbilicus. There is no pericolic stranding. Diverticula are seen in the colon without signs of focal diverticulitis. Vascular/Lymphatic: Extensive arterial calcifications are seen. There is ectasia of the infrarenal aorta measuring 2.4 cm. No follow-up is recommended. There is no evidence of retroperitoneal hematoma. Reproductive: Unremarkable. Other: There is no ascites or pneumoperitoneum. Musculoskeletal: There is posterior fusion at L4-L5 and L5-S1 levels. No recent fracture is seen Degenerative changes are noted in both shoulders, thoracic spine and lumbar spine. IMPRESSION: No acute findings are seen in CT scan of chest, abdomen and pelvis. There is no evidence of mediastinal or retroperitoneal hematoma. There is no ascites or pneumoperitoneum. There is no laceration of solid organs. Atherosclerotic changes are noted in thoracic aorta with possible high-grade stenosis in the proximal course of left subclavian artery. Coronary artery calcifications are seen. Ectasia of the main pulmonary artery suggests pulmonary arterial hypertension. COPD. There are linear patchy infiltrates in left upper lobe and superior segment of left lower lobe suggesting  atelectasis/pneumonia and possibly scarring. Interstitial lung disease. Cardiomegaly. Fatty liver. Dilation of extrahepatic bile ducts most likely is related to previous cholecystectomy. No recent displaced fractures are seen. There is ventral hernia containing small portion of transverse colon without obstruction. Left adrenal nodule has not changed in size, possibly adenoma. Other findings as described in the body of the report. Electronically Signed   By: Elmer Picker M.D.   On: 04/23/2022 20:04   CT Head Wo Contrast  Result Date: 04/23/2022 CLINICAL DATA:  Fall EXAM: CT HEAD WITHOUT CONTRAST CT CERVICAL SPINE WITHOUT CONTRAST TECHNIQUE: Multidetector CT imaging of the head and cervical spine was performed following the standard protocol without intravenous contrast. Multiplanar CT image reconstructions of the cervical spine were also generated. RADIATION DOSE REDUCTION: This exam was performed  according to the departmental dose-optimization program which includes automated exposure control, adjustment of the mA and/or kV according to patient size and/or use of iterative reconstruction technique. COMPARISON:  CT head dated 11/08/2020 FINDINGS: CT HEAD FINDINGS Brain: No evidence of acute infarction, hemorrhage, hydrocephalus, extra-axial collection or mass lesion/mass effect. Subcortical white matter and periventricular small vessel ischemic changes. Vascular: Intracranial atherosclerosis. Skull: Normal. Negative for fracture or focal lesion. Hyperostosis frontalis. Sinuses/Orbits: The visualized paranasal sinuses are essentially clear. The mastoid air cells are unopacified. Other: None. CT CERVICAL SPINE FINDINGS Alignment: Normal cervical lordosis. Skull base and vertebrae: No acute fracture. No primary bone lesion or focal pathologic process. Soft tissues and spinal canal: No prevertebral fluid or swelling. No visible canal hematoma. Disc levels: Moderate degenerative changes of the mid/lower  cervical spine. Spinal canal is patent. Upper chest: Evaluated on dedicated CT chest. Other: None. IMPRESSION: No evidence of acute intracranial abnormality. Small vessel ischemic changes. No evidence of traumatic injury to the cervical spine. Moderate degenerative changes. Electronically Signed   By: Julian Hy M.D.   On: 04/23/2022 19:36   CT Cervical Spine Wo Contrast  Result Date: 04/23/2022 CLINICAL DATA:  Fall EXAM: CT HEAD WITHOUT CONTRAST CT CERVICAL SPINE WITHOUT CONTRAST TECHNIQUE: Multidetector CT imaging of the head and cervical spine was performed following the standard protocol without intravenous contrast. Multiplanar CT image reconstructions of the cervical spine were also generated. RADIATION DOSE REDUCTION: This exam was performed according to the departmental dose-optimization program which includes automated exposure control, adjustment of the mA and/or kV according to patient size and/or use of iterative reconstruction technique. COMPARISON:  CT head dated 11/08/2020 FINDINGS: CT HEAD FINDINGS Brain: No evidence of acute infarction, hemorrhage, hydrocephalus, extra-axial collection or mass lesion/mass effect. Subcortical white matter and periventricular small vessel ischemic changes. Vascular: Intracranial atherosclerosis. Skull: Normal. Negative for fracture or focal lesion. Hyperostosis frontalis. Sinuses/Orbits: The visualized paranasal sinuses are essentially clear. The mastoid air cells are unopacified. Other: None. CT CERVICAL SPINE FINDINGS Alignment: Normal cervical lordosis. Skull base and vertebrae: No acute fracture. No primary bone lesion or focal pathologic process. Soft tissues and spinal canal: No prevertebral fluid or swelling. No visible canal hematoma. Disc levels: Moderate degenerative changes of the mid/lower cervical spine. Spinal canal is patent. Upper chest: Evaluated on dedicated CT chest. Other: None. IMPRESSION: No evidence of acute intracranial abnormality.  Small vessel ischemic changes. No evidence of traumatic injury to the cervical spine. Moderate degenerative changes. Electronically Signed   By: Julian Hy M.D.   On: 04/23/2022 19:36   DG Pelvis Portable  Result Date: 04/23/2022 CLINICAL DATA:  Fall EXAM: PORTABLE PELVIS 1-2 VIEWS COMPARISON:  12/18/2020 FINDINGS: Bones are diffusely demineralized. No evidence of pelvic fracture or diastasis. No hip dislocation. Prior lumbosacral fusion. Atherosclerotic vascular calcification. IMPRESSION: No acute fracture or diastasis. Electronically Signed   By: Davina Poke D.O.   On: 04/23/2022 18:48   DG Chest Portable 1 View  Result Date: 04/23/2022 CLINICAL DATA:  Fall yesterday.  Right-sided chest trauma and pain. EXAM: PORTABLE CHEST 1 VIEW COMPARISON:  11/12/2021 FINDINGS: Stable mild cardiomegaly. Prior TAVR again noted. Aortic atherosclerotic calcification incidentally noted. Both lungs are clear. No evidence of pneumothorax or hemothorax. IMPRESSION: Stable mild cardiomegaly. No active lung disease. Electronically Signed   By: Marlaine Hind M.D.   On: 04/23/2022 18:42    Procedures Procedures  {Document cardiac monitor, telemetry assessment procedure when appropriate:1}  Medications Ordered in ED Medications  sodium chloride 0.9 % bolus 500  mL (0 mLs Intravenous Stopped 04/23/22 1859)  iohexol (OMNIPAQUE) 300 MG/ML solution 50 mL (50 mLs Intravenous Contrast Given 04/23/22 1930)  acetaminophen (TYLENOL) tablet 500 mg (500 mg Oral Given 04/23/22 1934)    ED Course/ Medical Decision Making/ A&P                           Medical Decision Making Amount and/or Complexity of Data Reviewed Labs: ordered. Radiology: ordered.  Risk OTC drugs. Prescription drug management.   Patient with fall yesterday.  Mechanical.  Hit right side.  Imaging reassuring.  No acute injury.  Initial mild hypotension is improved.  Hemoglobin kidney function at baseline.  Continued right shoulder tenderness.  Not  included in previous imaging we will add shoulder x-ray if negative should be able to discharge home.  {Document critical care time when appropriate:1} {Document review of labs and clinical decision tools ie heart score, Chads2Vasc2 etc:1}  {Document your independent review of radiology images, and any outside records:1} {Document your discussion with family members, caretakers, and with consultants:1} {Document social determinants of health affecting pt's care:1} {Document your decision making why or why not admission, treatments were needed:1} Final Clinical Impression(s) / ED Diagnoses Final diagnoses:  None    Rx / DC Orders ED Discharge Orders     None

## 2022-04-23 NOTE — ED Notes (Signed)
Patient transported to CT 

## 2022-04-23 NOTE — ED Triage Notes (Signed)
Pt states she had fall yesterday afternoon. Pt c/o pain in right side of face, right shoulder, elbow. Pt also c/o right rib pain. Pt also c/o bloating. Pt is also being treated for UTI.  Pt takes Eliquis. No LOC.

## 2022-04-23 NOTE — ED Notes (Signed)
Reviewed AVS/discharge instruction with patient. Time allotted for and all questions answered. Patient is agreeable for d/c and escorted to ed exit by staff.  

## 2022-05-02 ENCOUNTER — Encounter: Payer: Self-pay | Admitting: Cardiovascular Disease

## 2022-05-08 IMAGING — DX DG CHEST 1V PORT
1 series · 1 of 1 positions shown · non-contrast
Comparison: 06/19/2021

CLINICAL DATA: Short of breath.

EXAM:
PORTABLE CHEST 1 VIEW

[chest]
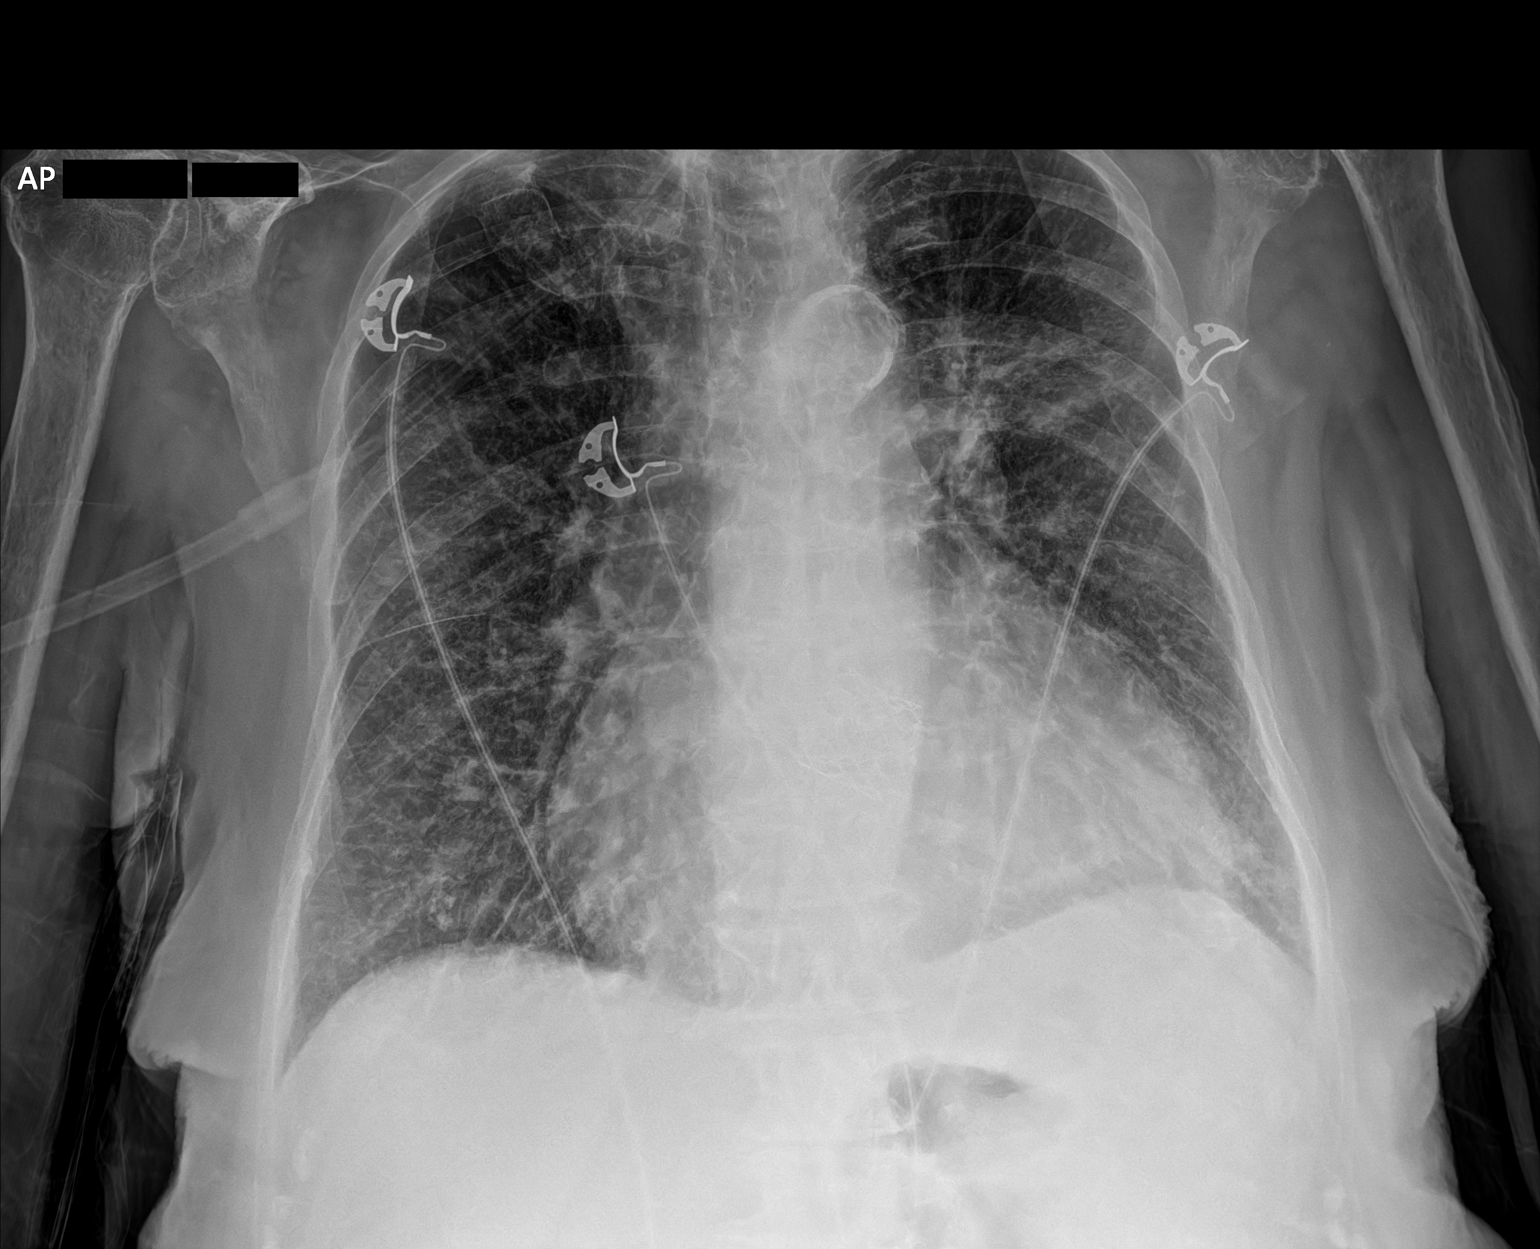

[1 of 1 positions shown; findings below may reference images not displayed]

FINDINGS: Cardiac enlargement. Status post TAVR. Aortic atherosclerosis.
Diffuse coarsened interstitial markings are identified throughout
both lungs compatible with emphysema. Post treatment changes within
the perihilar left upper lobe are again noted increase peripheral
interstitial markings identified concerning for mild interstitial
edema. New opacity within the periphery of the left base may
represent atelectasis or pneumonia. Bones appear diffusely
osteopenic.
IMPRESSION: 1. New opacity within the periphery of the left base may represent
atelectasis/pneumonia.
2. New mild interstitial edema. Correlate for any clinical signs or
symptoms of CHF.
3. Emphysema.

## 2022-05-09 ENCOUNTER — Other Ambulatory Visit: Payer: Self-pay | Admitting: Adult Health

## 2022-05-09 DIAGNOSIS — I5043 Acute on chronic combined systolic (congestive) and diastolic (congestive) heart failure: Secondary | ICD-10-CM

## 2022-05-11 ENCOUNTER — Non-Acute Institutional Stay: Payer: Medicare Other | Admitting: Family Medicine

## 2022-05-11 ENCOUNTER — Encounter: Payer: Self-pay | Admitting: Family Medicine

## 2022-05-11 VITALS — BP 126/56 | HR 91 | Resp 20

## 2022-05-11 DIAGNOSIS — E440 Moderate protein-calorie malnutrition: Secondary | ICD-10-CM

## 2022-05-11 DIAGNOSIS — R4189 Other symptoms and signs involving cognitive functions and awareness: Secondary | ICD-10-CM

## 2022-05-11 DIAGNOSIS — Z515 Encounter for palliative care: Secondary | ICD-10-CM

## 2022-05-11 NOTE — Progress Notes (Signed)
[facility] Consult Note Telephone: [phone]  Fax: [phone]    Date of encounter: 05/11/22 9:51 AM PATIENT NAME: Tara Fields [id number]   [phone] (home)  DOB: [birth year] MRN: [id number] PRIMARY CARE PROVIDER:    [name], NP,  [place] [address] [phone]  REFERRING PROVIDER:   [name], NP [place],  [place] [address] [phone]  RESPONSIBLE PARTY:    Contact Information     Name Relation Home Work Mobile   Tara Fields Son [phone]          I met face to face with patient and son in her independent living facility. Palliative Care was asked to follow this patient by consultation request of  Nafziger, Tommi Rumps, NP to address advance care planning and complex medical decision making. This is a follow up visit.                                   ASSESSMENT, SYMPTOM MANAGEMENT AND PLAN / RECOMMENDATIONS:  Palliative Care Encounter/Cognitive Deficits with sleep disturbance Advised she will have more difficulty with things that are newer to her, more difficulty when fatigued. Has some characteristics of sundowning. Question possible vascular process with intracranial atherosclerosis and small vessel ischemic changes.   Encouraged use of large digital clock with AM/PM visible in large print Encouraged use of 1/2 Seroquel 25 mg QHS to see if it improves sleep.    Malnutrition of Moderate Degree Encouraged to eat small HS snack to prevent nausea    Advance Care Planning/Goals of Care: Goals include to maximize quality of life and symptom management.  CODE STATUS: MOST as of 12/30/21: DNR/DNI with limited additional intervention Antibiotics if indicated  IV fluids and feeding tube on a case by case, time limited basis    Follow up Palliative Care Visit: Palliative care will continue to follow for complex  medical decision making, advance care planning, and clarification of goals. Return 6 weeks or prn.    This visit was coded based on medical decision making (MDM).  PPS: 50%  HOSPICE ELIGIBILITY/DIAGNOSIS: TBD  Chief Complaint:   Palliative Care is continuing to follow pt for chronic medical management in setting of chronic combined systolic and diastolic heart failure with c/o poor sleep and overnight confusion.   HISTORY OF PRESENT ILLNESS:  Tara Fields is a 88 y.o. year old female with chronic combined systolic and diastolic heart failure, COPD, CAD, aortic stenosis status post TAVR, bilateral carotid artery stenosis, hypertension, persistent atrial fibrillation, GERD, history of GI bleed, diabetes, hyperlipidemia, OA of multiple joints, CKD, diverticulitis, vitamin D deficiency, cognitive deficit, anemia, leg cramps, malnutrition, pain of upper abdomen and difficulty swallowing pills so she has changed to liquid potassium. She has intermittent cough with clear PND, occasionally productive. Denies fever.  Has DOE.  Had recent fall and hit her right side and ribs and went to the ER to be checked out. Son states antibiotics helped level out mood from possible UTI but did not create significant improvement.  Cardiologist took her off ASA. Only eats one good meal per day and has some nausea which resolves with eating.  She expresses significant frustration with poor sleep and describes waking up confused as to the time of day whether AM or PM.  Son has gotten her an [place] audio device but she doesn't remember to  use it.  She becomes anxious and alarmed when unaware of the time of day.  Previously she was given a prescription for Seroquel 25 mg to help with sleep and encouraged to only start with half but never started.  She is afraid of becoming more confused so son plans to come spend the night with her and get her to try it.  He has also investigated hiring for private pay to come in her home.  Advised son to locate a clock that has large numbers, easy to see that says AM and PM so she is better able to orient.  Encouraged to eat bedtime snack. She plans to get Covid new vaccine today, received influenza vaccine 2 weeks ago.    History obtained from review of EMR, discussion with son and/or Tara Fields.      Latest Ref Rng & Units 04/23/2022    5:55 PM 12/02/2021   10:05 AM 11/14/2021    5:37 AM  CBC  WBC 4.0 - 10.5 K/uL 7.4  5.5  6.1   Hemoglobin 12.0 - 15.0 g/dL 11.8  11.5  10.3   Hematocrit 36.0 - 46.0 % 34.6  34.7  32.6   Platelets 150 - 400 K/uL 199  233.0  240        Latest Ref Rng & Units 04/23/2022    5:55 PM 03/17/2022   11:51 AM 12/02/2021   10:05 AM  CMP  Glucose 70 - 99 mg/dL 131  129  125   BUN 8 - 23 mg/dL 57  53  33   Creatinine 0.44 - 1.00 mg/dL 1.20  1.21  1.06   Sodium 135 - 145 mmol/L 136  141  138   Potassium 3.5 - 5.1 mmol/L 4.1  4.2  4.6   Chloride 98 - 111 mmol/L 98  100  101   CO2 22 - 32 mmol/L '25  23  30   ' Calcium 8.9 - 10.3 mg/dL 9.4  9.7  10.0   Total Protein 6.5 - 8.1 g/dL 7.2   7.0   Total Bilirubin 0.3 - 1.2 mg/dL 1.0   0.8   Alkaline Phos 38 - 126 U/L 64   83   AST 15 - 41 U/L 21   22   ALT 0 - 44 U/L 14   16   04/23/22 DG Right Shoulder: FINDINGS: There is osteopenia without evidence of fractures or dislocation. Spurring and joint space loss at the [facility] joint is again noted, inferior spurring at the glenohumeral joint with joint narrowing, and there is again seen acromiohumeral abutment consistent with chronic rotator cuff arthropathy and old degenerative tear, which was noted previously.   The visualized right ribs show no displaced fractures. Right upper lobe is emphysematous but clear.   IMPRESSION: Osteopenia and degenerative change without evidence of fracture or dislocation. Chronic rotator cuff arthropathy.   04/23/22 DG Chest Portable 1 view: CLINICAL DATA:  Fall yesterday.  Right-sided chest trauma and pain.    EXAM: PORTABLE CHEST 1 VIEW   COMPARISON:  11/12/2021   FINDINGS: Stable mild cardiomegaly. Prior TAVR again noted. Aortic atherosclerotic calcification incidentally noted. Both lungs are clear. No evidence of pneumothorax or hemothorax.   IMPRESSION: Stable mild cardiomegaly. No active lung disease.  04/23/22 DG Pelvis portable: FINDINGS: Bones are diffusely demineralized. No evidence of pelvic fracture or diastasis. No hip dislocation. Prior lumbosacral fusion. Atherosclerotic vascular calcification.   IMPRESSION: No acute fracture or diastasis.   04/23/22 CT Head/Cervical Spine: FINDINGS: CT HEAD  FINDINGS   Brain: No evidence of acute infarction, hemorrhage, hydrocephalus, extra-axial collection or mass lesion/mass effect.   Subcortical white matter and periventricular small vessel ischemic changes.   Vascular: Intracranial atherosclerosis.   Skull: Normal. Negative for fracture or focal lesion. Hyperostosis frontalis.   Sinuses/Orbits: The visualized paranasal sinuses are essentially clear. The mastoid air cells are unopacified.   Other: None.   CT CERVICAL SPINE FINDINGS   Alignment: Normal cervical lordosis.   Skull base and vertebrae: No acute fracture. No primary bone lesion or focal pathologic process.   Soft tissues and spinal canal: No prevertebral fluid or swelling. No visible canal hematoma.   Disc levels: Moderate degenerative changes of the mid/lower cervical spine. Spinal canal is patent.   Upper chest: Evaluated on dedicated CT chest.   Other: None.   IMPRESSION: No evidence of acute intracranial abnormality. Small vessel ischemic changes.   2d echo 11/09/21: Study Result  2 d ECHO impressions:     1. S/P TAVR with mild perivalvular AI; no change compared to previous.   2. Left ventricular EF  20 to 25%. LV severely decreased function and global hypokinesis. The left ventricular internal cavity size was severely dilated. Left  ventricular  diastolic function could not be evaluated.   3. Right ventricular size and systolic function are normal. There is moderately elevated pulmonary artery systolic pressure.   4. Severe bi-atrial dilatation  5. The mitral valve is abnormal. Mild mitral valve regurgitation. No  evidence of mitral stenosis.   6. The aortic valve has been repaired/replaced. Aortic valve  regurgitation is mild. No aortic stenosis is present. There is a 26 mm  Sapien prosthetic (TAVR) valve present in the aortic position. Procedure  Date: 03/07/19.   7. The inferior vena cava is dilated in size with <50% respiratory  variability, suggesting right atrial pressure of 15 mmHg.      I reviewed available labs, medications, imaging, studies and related documents from the EMR.  Records reviewed and summarized above.   ROS General: NAD ENMT: denies dysphagia Cardiovascular: denies chest pain, endorses DOE Pulmonary:endorses clear cough, denies increased SOB Abdomen: endorses good appetite, endorses continence of bowel, denies constipation, endorses nausea at night GU: denies dysuria, endorses incontinence of urine MSK:  denies increased weakness, bilateral knee pain, no falls reported Skin:  Neurological: Continues having difficulty falling asleep and waking up confused as to time of day Psych: Endorses positive mood   Physical Exam: Current and past weights: Current weight 136 lbs as of 04/30/22, 137 lbs on 02/03/22 Constitutional: NAD General: WN,WD CV: S1S2, IRIR,  no LE edema Pulmonary: CTAB, no increased work of breathing, no cough, room air Abdomen: normo-active BS + 4 quadrants, soft and non tender MSK: no sarcopenia, moves all extremities, ambulatory with rollator Skin: warm and dry, ecchymosis of right shoulder Neuro:  no generalized weakness,  noted difficulty with time of day Psych: mod anxious affect, A and O x 3   Thank you for the opportunity to participate in the care of Ms.  Kunkler.  The palliative care team will continue to follow. Please call our office at [phone] if we can be of additional assistance.   Marijo Conception, FNP -C  COVID-19 PATIENT SCREENING TOOL Asked and negative response unless otherwise noted:   Have you had symptoms of covid, tested positive or been in contact with someone with symptoms/positive test in the past 5-10 days? No

## 2022-06-08 ENCOUNTER — Encounter: Payer: Self-pay | Admitting: Family Medicine

## 2022-06-08 ENCOUNTER — Non-Acute Institutional Stay: Payer: Medicare Other | Admitting: Family Medicine

## 2022-06-08 ENCOUNTER — Encounter: Payer: Self-pay | Admitting: Adult Health

## 2022-06-08 VITALS — BP 112/64 | HR 72 | Resp 20

## 2022-06-08 DIAGNOSIS — R4189 Other symptoms and signs involving cognitive functions and awareness: Secondary | ICD-10-CM

## 2022-06-08 DIAGNOSIS — E440 Moderate protein-calorie malnutrition: Secondary | ICD-10-CM

## 2022-06-08 DIAGNOSIS — Z515 Encounter for palliative care: Secondary | ICD-10-CM

## 2022-06-08 NOTE — Progress Notes (Signed)
  [facility] Palliative Care Consult Note Telephone: [phone]  Fax: [phone]    Date of encounter: 06/08/22 10:21 AM PATIENT NAME: Tara Fields [address] [place] [address]   [phone] (home)  DOB: 12/09/1927 MRN: [id number] PRIMARY CARE PROVIDER:    Nafziger, Cory, NP,  [address] [place] [address] [phone]  REFERRING PROVIDER:   Nafziger, Cory, NP [address] [place],  [place] [address] [phone]  RESPONSIBLE PARTY:    Contact Information     Name Relation Home Work Mobile   Tara Fields,Tara Fields Son [phone]          I met face to face with patient and son in her independent living facility. Palliative Care was asked to follow this patient by consultation request of  Nafziger, Cory, NP to address advance care planning and complex medical decision making. This is a follow up visit.                                   ASSESSMENT, SYMPTOM MANAGEMENT AND PLAN / RECOMMENDATIONS:  Palliative Care Encounter/Cognitive Deficits with sleep disturbance Declines to use 1/2 Seroquel 25 mg at HS. Leg cramping has also woken her up or kept her awake-taking potassium and magnesium supplements Question component of vascular insufficiency contributing to pain/spasm   Malnutrition of Moderate Degree Eating small HS snack of cereal has helped with nausea Weight has been stable    Advance Care Planning/Goals of Care: Goals include to maximize quality of life and symptom management.  CODE STATUS: MOST as of 12/30/21: DNR/DNI with limited additional intervention Antibiotics if indicated  IV fluids and feeding tube on a case by case, time limited basis    Follow up Palliative Care Visit: Palliative care will continue to follow for complex medical decision making, advance care planning, and clarification of goals. Return 4 weeks or prn.    This visit was coded based on medical decision making  (MDM).  PPS: 50%  HOSPICE ELIGIBILITY/DIAGNOSIS: TBD  Chief Complaint:   Palliative Care is continuing to follow pt for chronic medical management in setting of chronic combined systolic and diastolic heart failure with cognitive deficits.   HISTORY OF PRESENT ILLNESS:  Tara Fields is a 88 y.o. year old female with chronic combined systolic and diastolic heart failure, COPD, CAD, aortic stenosis status post TAVR, bilateral carotid artery stenosis, hypertension, persistent atrial fibrillation, GERD, history of GI bleed, diabetes, hyperlipidemia, OA of multiple joints, CKD, diverticulitis, vitamin D deficiency, cognitive deficit, anemia, leg cramps, malnutrition, pain of upper abdomen and difficulty swallowing pills so she has changed to liquid potassium. She has leg spasms when she goes to bed, does not have increase when walking.  Has chronic bilateral knee pain. Has some SOB at night when laying down. Denies nausea and vomiting, Weight is stable and pt eating cereal at HS or early in am has decreased nausea. Not using Seroquel to help with sleep and goes to bed early at 6 pm.. She is taking potassium and magnesium and was placed  on spironolactone instead of HCTZ to avoid hypokalemia.   History obtained from review of EMR, discussion with son and/or Ms. Brazee.     I reviewed available labs, medications, imaging, studies and related documents from the EMR.  No new records since last Palliative Care visit.   ROS General: NAD ENMT: denies dysphagia Cardiovascular: denies chest pain, endorses mild DOE Pulmonary:   no cough, denies increased SOB Abdomen: endorses good appetite, endorses continence of bowel,  endorses constipation, denies nausea GU: denies dysuria, endorses incontinence of urine MSK:  denies increased weakness, bilateral knee pain, no falls reported Skin: has some skin color changes to BLE Neurological: Continues having difficulty falling asleep and waking up  confused intermittently Psych: Endorses positive mood   Physical Exam: Current and past weights: Current weight 136 lbs as of 04/30/22, 137 lbs on 02/03/22 Constitutional: NAD General: WN,WD CV: S1S2, RRR,  no LE edema Pulmonary: CTAB, no increased work of breathing, no cough, room air Abdomen: normo-active BS + 4 quadrants, soft and non tender MSK: no sarcopenia, moves all extremities, ambulatory with rollator Skin: Dry skin BLE, slight brown discoloration of BLE from below knee down, cool distal feet, increasing warmth from midfoot to just below knee Bilaterally Neuro:  no generalized weakness,  noted difficulty with time of day Psych: no anxious affect, A and O x 3   Thank you for the opportunity to participate in the care of Ms. Fahrner.  The palliative care team will continue to follow. Please call our office at [phone] if we can be of additional assistance.    A , FNP -C  COVID-19 PATIENT SCREENING TOOL Asked and negative response unless otherwise noted:   Have you had symptoms of covid, tested positive or been in contact with someone with symptoms/positive test in the past 5-10 days? No 

## 2022-06-18 ENCOUNTER — Encounter: Payer: Self-pay | Admitting: Family Medicine

## 2022-06-18 ENCOUNTER — Other Ambulatory Visit: Payer: Self-pay | Admitting: Adult Health

## 2022-06-18 ENCOUNTER — Non-Acute Institutional Stay: Payer: Medicare Other | Admitting: Family Medicine

## 2022-06-18 VITALS — BP 114/72 | HR 104 | Resp 22

## 2022-06-18 DIAGNOSIS — R4189 Other symptoms and signs involving cognitive functions and awareness: Secondary | ICD-10-CM

## 2022-06-18 DIAGNOSIS — I4891 Unspecified atrial fibrillation: Secondary | ICD-10-CM

## 2022-06-18 NOTE — Progress Notes (Signed)
[facility] Consult Note Telephone: [phone]  Fax: [phone]    Date of encounter: 06/18/22 9:07 PM PATIENT NAME: Tara Fields [id number]   [phone] (home)  DOB: [birth year] MRN: [id number] PRIMARY CARE PROVIDER:    [name], NP,  [place] [address] [phone]  REFERRING PROVIDER:   [name], NP [place],  [place] [address] [phone]  RESPONSIBLE PARTY:    Contact Information     Name Relation Home Work Mobile   Tara Fields Son [phone]          I met face to face with patient in her AL facility.  Spoke with son by phone after visit. Palliative Care was asked to follow this patient by consultation request of  Nafziger, Tommi Rumps, NP to address advance care planning and complex medical decision making. This is a follow up visit    ASSESSMENT , SYMPTOM MANAGEMENT AND PLAN / RECOMMENDATIONS:  Afib with RVR Mild elevation of heart rate. Continue Metoprolol BID for rate control. Continue Eliquis 5 mg BID for anticoagulation with no evidence of blood loss. Continue Spironolactone to help with decreasing blood volume/prevent development of pulmonary edema. Educated if pulse faster, CP, worsening SOB or lightheadedness that she needs to be evaluated in ED. (And repeated to son by phone) Question if change related to infection, low intake volume or some aspect of both contributing to more rapid rate. Gave 2nd dose of Metoprolol that had been halved to improve rate control.  2.  Cognitive Deficits AMS with paranoia, disorganized thinking and confusion Question if related to possible infection, perfusion deficits with RVR, possible sleep deprivation or some combination.   Advance Care Planning/Goals of Care: Goals include to maximize quality of life and symptom management.  Review of an existing advance directive  document-MOST . Decision not to resuscitate or to de-escalate disease focused treatments due to poor prognosis. CODE STATUS: MOST as of 12/30/21: DNR/DNI with limited additional intervention Use of antibiotics if indicated Use of IV fluids and feeding tube on a case by case, time limited basis .      Follow up Palliative Care Visit: Palliative care will continue to follow for complex medical decision making, advance care planning, and clarification of goals. Return 2 weeks or prn.    This visit was coded based on medical decision making (MDM).  PPS: 60%  HOSPICE ELIGIBILITY/DIAGNOSIS: TBD  Chief Complaint:  Acute visit requested by pt's son Tara Fields with pt displaying increased agitation, paranoia and argumentativeness today.  She has recently completed antibiotics for presumed UTI.  HISTORY OF PRESENT ILLNESS:  Tara Fields is a 88 y.o. year old female with chronic combined systolic and diastolic heart failure, COPD, CAD, aortic stenosis status post Tara Fields, bilateral carotid artery stenosis, hypertension, persistent atrial fibrillation, GERD, history of GI bleed, diabetes, hyperlipidemia, OA of multiple joints, CKD, diverticulitis, vitamin D deficiency, cognitive deficit, anemia, leg cramps, malnutrition, pain of upper abdomen and difficulty swallowing pills so she has changed to liquid potassium. She has leg spasms when she goes to bed, does not have increase when walking.  Has chronic bilateral knee pain. Received phone messge from son Tara Fields stating that pt had just finished antibiotics but she still was acting agitated and paranoid.  He states she started out the day well but then became more agitated, belligerent and argumentative.  He had requested a call back. Pt says "Tara Fields must have called you.  Did he ask you to come check on me?"  She states she was invited to Tara Fields's home to celebrate someone's birthday but he lied to me, doesn't matter we don't need to discuss it.  She told a  long rambling story about when the boys were kids, Tara Fields always called her "Tara Fields" while Tara Fields called her "mom".  She skipped ahead to being asked to come to the area to live with he and his wife.  She states that she and her daughter-in-law don't get along and states that her daughter-in-law has accused her of some terrible things but "it doesn't matter what" and "all I want is for someone in that family to apologize to me and we'll let bygones be bygones".  She says she asked if they were sure that they wanted [place] in law accused her of lying and she has accused Tara Fields of lying to her. Pt denies CP but was actively dyspneic while walking in her apartment prior to entry into apartment and pt was not aware that provider was there.  She denies pain or burning on urination, constipation.  She states not having eaten lunch today but had taken her medicine and when looking at pillbox she had not taken Metoprolol XL.  Encouraged her to take the pill currently.  Denies lightheadedness at present but reports she has it intermittently.  When noting her pulse was irregularly irregular and rapid, she stated "Don't send me to the hospital.  I don't want to go."    History obtained from review of EMR, discussion with  Tara Fields.   I reviewed EMR for available labs, medications, imaging, studies and related documents.  There are no new records since last visit.   ROS General: NAD EYES: denies vision changes ENMT: denies dysphagia Cardiovascular: denies chest pain, endorses mild DOE Pulmonary: denies cough, denies increased SOB Abdomen: endorses poor appetite, denies constipation, endorses continence of bowel GU: denies dysuria, endorses continence of urine MSK:  denies increased weakness, no falls reported Skin: denies rashes or wounds Neurological: denies pain, endorses continued insomnia but has refused seroquel low dose for sleep Psych:  Endorses poor mood, agitation Heme/lymph/immuno: denies bruises, abnormal bleeding  Physical Exam: Current and past weights: 136 lbs as of 04/23/22 Constitutional: NAD General: WNWD ENMT: intact hearing, oral mucous membranes moist, dentition intact CV: S1S2, Mildly fast IRIR, no LE edema Pulmonary: CTAB, mild dyspnea and tachypnea noted, no increased work of breathing, no cough, room air Abdomen: normo-active BS + 4 quadrants, soft and non tender GU: deferred MSK: no sarcopenia, moves all extremities, ambulatory with rollator, uses scooter for long distances Skin: warm and dry, no rashes or wounds on visible skin Neuro:  no generalized weakness,  no cognitive impairment Psych: moderately anxious affect, rapid pressured speech and tangential thought process, paranoia, confusion Hem/lymph/immuno: no widespread bruising   Thank you for the opportunity to participate in the care of Ms. Alas.  The palliative care team will continue to follow. Please call our office at [phone] if we can be of additional assistance.   Marijo Conception, FNP -C  COVID-19 PATIENT SCREENING TOOL Asked and negative response unless otherwise noted:   Have you had symptoms of covid, tested positive or been in contact with someone with symptoms/positive test in the past 5-10 days?  No

## 2022-06-21 ENCOUNTER — Telehealth: Payer: Self-pay | Admitting: Family Medicine

## 2022-06-21 NOTE — Telephone Encounter (Signed)
Received message from pt's son Louie Casa that pt was continuing to act paranoid towards him and "berate me for things from my entire life including my dirty diapers". He states knowing she had completed all her antibiotic but says she remains acting like she does when she has a UTI. Requested call back to discuss longer course of therapy or suggestions as she was extremely difficult to handle.TCT pt's son Louie Casa. Left message and advised that therapy should have worked if the organism she has is susceptible but without obtaining a urine it would be difficult to determine if she has a resistive organism or if there was an alternate explanation for her sudden paranoia.  Advised she needs workuo to see if there remains a UTI or alternate infection non-responsive to antibiotic prescribed or alternate explanation for acute mental status change.  Advised if she is wondering, won't allow a urine specimen and won't go in to see PCP but is still being paranoid, acting bizarrely or wandering that she may be considered a danger to herself and he could take out a warrant at the magistrate's office to compel them to pick her up and have her evaluated in the ER.  Encouraged to call back tomorrow to discuss.  Damaris Hippo FNP-C

## 2022-06-22 ENCOUNTER — Encounter: Payer: Self-pay | Admitting: Family Medicine

## 2022-06-22 ENCOUNTER — Ambulatory Visit: Payer: Medicare Other | Admitting: Adult Health

## 2022-06-22 ENCOUNTER — Ambulatory Visit (INDEPENDENT_AMBULATORY_CARE_PROVIDER_SITE_OTHER): Payer: Medicare Other | Admitting: Adult Health

## 2022-06-22 ENCOUNTER — Encounter: Payer: Self-pay | Admitting: Adult Health

## 2022-06-22 VITALS — BP 110/70 | HR 86 | Temp 97.3°F | Ht 60.0 in | Wt 135.0 lb

## 2022-06-22 DIAGNOSIS — R300 Dysuria: Secondary | ICD-10-CM | POA: Diagnosis not present

## 2022-06-22 DIAGNOSIS — G4700 Insomnia, unspecified: Secondary | ICD-10-CM

## 2022-06-22 DIAGNOSIS — E1169 Type 2 diabetes mellitus with other specified complication: Secondary | ICD-10-CM | POA: Diagnosis not present

## 2022-06-22 DIAGNOSIS — R451 Restlessness and agitation: Secondary | ICD-10-CM

## 2022-06-22 DIAGNOSIS — I6523 Occlusion and stenosis of bilateral carotid arteries: Secondary | ICD-10-CM | POA: Diagnosis not present

## 2022-06-22 DIAGNOSIS — E785 Hyperlipidemia, unspecified: Secondary | ICD-10-CM | POA: Diagnosis not present

## 2022-06-22 LAB — COMPREHENSIVE METABOLIC PANEL
ALT: 21 U/L (ref 0–35)
AST: 26 U/L (ref 0–37)
Albumin: 4.2 g/dL (ref 3.5–5.2)
Alkaline Phosphatase: 73 U/L (ref 39–117)
BUN: 58 mg/dL — ABNORMAL HIGH (ref 6–23)
CO2: 26 mEq/L (ref 19–32)
Calcium: 9.7 mg/dL (ref 8.4–10.5)
Chloride: 100 mEq/L (ref 96–112)
Creatinine, Ser: 1.83 mg/dL — ABNORMAL HIGH (ref 0.40–1.20)
GFR: 23.71 mL/min — ABNORMAL LOW (ref >60.00)
Glucose, Bld: 116 mg/dL — ABNORMAL HIGH (ref 70–99)
Potassium: 5 mEq/L (ref 3.5–5.1)
Sodium: 136 mEq/L (ref 135–145)
Total Bilirubin: 0.7 mg/dL (ref 0.2–1.2)
Total Protein: 7.1 g/dL (ref 6.0–8.3)

## 2022-06-22 LAB — CBC WITH DIFFERENTIAL/PLATELET
Basophils Absolute: 0 10*3/uL (ref 0.0–0.1)
Basophils Relative: 0.4 % (ref 0.0–3.0)
Eosinophils Absolute: 0.1 10*3/uL (ref 0.0–0.7)
Eosinophils Relative: 0.6 % (ref 0.0–5.0)
HCT: 40.9 % (ref 36.0–46.0)
Hemoglobin: 13.5 g/dL (ref 12.0–15.0)
Lymphocytes Relative: 10.4 % — ABNORMAL LOW (ref 12.0–46.0)
Lymphs Abs: 1.1 10*3/uL (ref 0.7–4.0)
MCHC: 32.9 g/dL (ref 30.0–36.0)
MCV: 91.2 fl (ref 78.0–100.0)
Monocytes Absolute: 0.7 10*3/uL (ref 0.1–1.0)
Monocytes Relative: 6.8 % (ref 3.0–12.0)
Neutro Abs: 8.3 10*3/uL — ABNORMAL HIGH (ref 1.4–7.7)
Neutrophils Relative %: 81.8 % — ABNORMAL HIGH (ref 43.0–77.0)
Platelets: 171 10*3/uL (ref 150.0–400.0)
RBC: 4.49 Mil/uL (ref 3.87–5.11)
RDW: 16.5 % — ABNORMAL HIGH (ref 11.5–15.5)
WBC: 10.1 10*3/uL (ref 4.0–10.5)

## 2022-06-22 LAB — POCT URINALYSIS DIPSTICK
Bilirubin, UA: NEGATIVE
Blood, UA: POSITIVE
Glucose, UA: NEGATIVE
Ketones, UA: NEGATIVE
Nitrite, UA: NEGATIVE
Protein, UA: POSITIVE — AB
Spec Grav, UA: 1.01 (ref 1.010–1.025)
Urobilinogen, UA: 0.2 E.U./dL
pH, UA: 6 (ref 5.0–8.0)

## 2022-06-22 LAB — HEMOGLOBIN A1C: Hgb A1c MFr Bld: 6.8 % — ABNORMAL HIGH (ref 4.6–6.5)

## 2022-06-22 NOTE — [place] Notes (Signed)
Subjective:    Patient ID: Tara Fields, female    DOB: 1928/03/14, 88 y.o.   MRN: [id number]  HPI  88 year old female who  has a past medical history of Acute exacerbation of CHF (congestive heart failure) ([place]) (12/18/2020), AKI (acute kidney injury) ([place]), Aortic stenosis, CAD (coronary artery disease), COPD (chronic obstructive pulmonary disease) ([place]), Diabetes mellitus ([place]), Diverticulitis, GERD (gastroesophageal reflux disease), HTN (hypertension), Hyperlipidemia, Legally blind in left eye, as defined in [place], Tobacco abuse, and UTI (urinary tract infection) (11/10/2021).  She presents to the office today with her son for concern of a UTI. Her son reports that the patient has started to act paranoid towards him and has started to berate him, these are similar symptoms that she has experienced when she had a UTI.  Her son reports that she has been on a couple courses of ciprofloxacin, about 3 days into the course that he starts to make improvement and then 5 or 6 days after completing the antibiotic therapy her symptoms return.  Denies fevers, chills.  She does have urinary frequency and urgency but is also on Lasix, questionable dysuria.  She also reports that she continues to have trouble sleeping and has been leg cramps.  Her palliative care nurse practitioner recommended starting half tab of Seroquel nightly but she refused, she continues to refuse sleep medication due to "living alone"  at [place] she is concerned that she may hallucinate.  Review of Systems See HPI   Past Medical History:  Diagnosis Date   Acute exacerbation of CHF (congestive heart failure) ([place]) 12/18/2020   AKI (acute kidney injury) ([place])    Aortic stenosis    s/p TAVR 26 mm Edwards Sapien 3 THV July 2020   CAD (coronary artery disease)    COPD (chronic obstructive pulmonary disease) (H[facility])    Diabetes mellitus (H[facility])    Diverticulitis    GERD (gastroesophageal reflux disease)    HTN (hypertension)     Hyperlipidemia    Legally blind in left eye, as defined in [place]    Tobacco abuse    UTI (urinary tract infection) 11/10/2021    Social History   Socioeconomic History   Marital status: Widowed    Spouse name: Not on file   Number of children: 2   Years of education: Not on file   Highest education level: High school graduate  Occupational History   Occupation: Retired    Comment: homemaker  Tobacco Use   Smoking status: Former    Packs/day: 1.00    Years: 70.00    Total pack years: 70.00    Types: Cigarettes    Quit date: 12/15/2019    Years since quitting: 2.5   Smokeless tobacco: Never   Tobacco comments:    Pt states quiting a year ago. 01/03/20 ARJ  Vaping Use   Vaping Use: Never used  Substance and Sexual Activity   Alcohol use: No    Alcohol/week: 0.0 standard drinks of alcohol   Drug use: No   Sexual activity: Not Currently  Other Topics Concern   Not on file  Social History Narrative   Widowed- was married for 85 years    Has two sons       Social Determinants of Health   Financial Resource Strain: Low Risk  (12/19/2020)   Overall Financial Resource Strain (CARDIA)    Difficulty of Paying Living Expenses: Not hard at all  Food Insecurity: No Food Insecurity (03/17/2021)   Hunger Vital  Sign    Worried About Charity fundraiser in the Last Year: Never true    [place] in the Last Year: Never true  Transportation Needs: No Transportation Needs (03/17/2021)   PRAPARE - Hydrologist (Medical): No    Lack of Transportation (Non-Medical): No  Physical Activity: Insufficiently Active (03/17/2021)   Exercise Vital Sign    Days of Exercise per Week: 5 days    Minutes of Exercise per Session: 10 min  Stress: No Stress Concern Present (03/17/2021)   [place]    Feeling of Stress : Not at all  Social Connections: Socially Isolated (03/17/2021)   Social Connection and  Isolation Panel [NHANES]    Frequency of Communication with Friends and Family: Three times a week    Frequency of Social Gatherings with Friends and Family: Three times a week    Attends Religious Services: Never    Active Member of Clubs or Organizations: No    Attends Archivist Meetings: Never    Marital Status: Widowed  Intimate Partner Violence: Not At Risk (03/17/2021)   Humiliation, Afraid, Rape, and Kick questionnaire    Fear of Current or Ex-Partner: No    Emotionally Abused: No    Physically Abused: No    Sexually Abused: No    Past Surgical History:  Procedure Laterality Date   ABDOMINAL HYSTERECTOMY     APPENDECTOMY     BACK SURGERY     CATARACT EXTRACTION, BILATERAL     CHOLECYSTECTOMY     COLONOSCOPY WITH PROPOFOL N/A 04/16/2021   Procedure: COLONOSCOPY WITH PROPOFOL;  Surgeon: Milus Banister, MD;  Location: [facility];  Service: Gastroenterology;  Laterality: N/A;   ESOPHAGOGASTRODUODENOSCOPY (EGD) WITH PROPOFOL N/A 04/15/2021   Procedure: ESOPHAGOGASTRODUODENOSCOPY (EGD) WITH PROPOFOL;  Surgeon: Doran Stabler, MD;  Location: [facility];  Service: Gastroenterology;  Laterality: N/A;    Family History  Problem Relation Age of Onset   Diabetes Mother    Stroke Brother    Cerebral palsy Brother    Coronary artery disease Neg Hx     Allergies  Allergen Reactions   Amlodipine Besy-Benazepril Hcl Cough   Hctz [Hydrochlorothiazide] Other (See Comments)    Per family, every diuretic the patient has been on flushes out her out and ultimately results in CRAMPING/JAUNDICE (added potassium might help)   Other Other (See Comments)    History of diverticulitis- CANNOT TOLERATE NUTS, CORN, AND ANY FOODS NOT EASILY DIGESTED- the patient AVOIDS these   Cyclobenzaprine Other (See Comments)    Confusion- does not tolerate well    Diclofenac Sodium Diarrhea   Hydralazine Other (See Comments)    Per family, every diuretic the patient has been on flushes out  her out and ultimately results in CRAMPING/JAUNDICE (added potassium might help)   Methylprednisolone Other (See Comments)    Medrol/CORTICOSTEROIDS = A LOT OF ENERGY   Oxycodone Other (See Comments)    Patient becomes very disorientated     Current Outpatient Medications on File Prior to Visit  Medication Sig Dispense Refill   acetaminophen (TYLENOL) 500 MG tablet Take 500 mg by mouth 3 (three) times daily as needed for moderate pain. Take with Tramadol     apixaban (ELIQUIS) 5 MG TABS tablet Take 1 tablet (5 mg total) by mouth 2 (two) times daily. 180 tablet 1   atorvastatin (LIPITOR) 40 MG tablet TAKE 1 TABLET BY MOUTH DAILY  AT 6 PM. 90 tablet 1   cholecalciferol (VITAMIN D3) 25 MCG (1000 UNIT) tablet Take 1,000 Units by mouth 2 (two) times daily.     Coenzyme Q10 (CO Q-10) 300 MG CAPS Take 300 mg by mouth in the morning.     Ferrous Gluconate 324 (37.5 Fe) MG TABS Take 1 tablet (324 mg total) by mouth daily with breakfast. (Patient taking differently: Take 324 mg by mouth daily with breakfast. Every other day) 90 tablet 3   Lidocaine-Menthol, Spray, 4-1 % LIQD Apply 1 spray topically daily as needed (back/knee pain/restless legs).     Magnesium 200 MG TABS Take 200 mg by mouth every evening.     metoprolol succinate (TOPROL-XL) 50 MG 24 hr tablet TAKE 1 TABLET (50 MG TOTAL) BY MOUTH TWICE A DAY .TAKE WITH OR IMMEDIATELY FOLLOWING A MEAL 180 tablet 1   Multiple Vitamin (MULTIVITAMIN WITH MINERALS) TABS tablet Take 1 tablet by mouth 2 (two) times daily. Centrum Minis     psyllium (METAMUCIL) 58.6 % powder Take 1 packet by mouth daily as needed (for constipation).     spironolactone (ALDACTONE) 25 MG tablet TAKE 1/2 TABLET BY MOUTH DAILY 45 tablet 1   terazosin (HYTRIN) 10 MG capsule TAKE 1 CAPSULE BY MOUTH AT BEDTIME. 90 capsule 1   torsemide (DEMADEX) 20 MG tablet TAKE 2 TABLETS (40 MG TOTAL) BY MOUTH DAILY. 180 tablet 1   vitamin B-12 (CYANOCOBALAMIN) 1000 MCG tablet Take 1 tablet (1,000  mcg total) by mouth daily. 30 tablet 3   losartan (COZAAR) 25 MG tablet TAKE 1 TABLET (25 MG TOTAL) BY MOUTH DAILY. (Patient not taking: Reported on 06/22/2022) 90 tablet 3   potassium chloride 20 MEQ/15ML (10%) SOLN Take 15 mLs (20 mEq total) by mouth daily. (Patient taking differently: Take 20 mEq by mouth daily. 7 Mls daily) 1350 mL 3   No current facility-administered medications on file prior to visit.    BP 110/70   Pulse 86   Temp (!) 97.3 F (36.3 C) (Oral)   Ht 5' (1.524 m)   Wt 135 lb (61.2 kg)   SpO2 98%   BMI 26.37 kg/m       Objective:   Physical Exam Vitals and nursing note reviewed.  Constitutional:      Appearance: Normal appearance.  Cardiovascular:     Rate and Rhythm: Normal rate and regular rhythm.     Pulses: Normal pulses.     Heart sounds: Normal heart sounds.  Pulmonary:     Effort: Pulmonary effort is normal.     Breath sounds: Normal breath sounds.  Musculoskeletal:        General: Normal range of motion.  Skin:    General: Skin is warm and dry.     Capillary Refill: Capillary refill takes less than 2 seconds.  Neurological:     General: No focal deficit present.     Mental Status: She is alert and oriented to person, place, and time.  Psychiatric:        Mood and Affect: Mood normal.        Behavior: Behavior normal.        Thought Content: Thought content normal.        Judgment: Judgment normal.       Assessment & Plan:   1. Agitation  - CBC with Differential/Platelet; Future - Comprehensive metabolic panel; Future - Urine Culture; Future +3 leuks, protein, and blood. - Will check urine culture. Consider adding daily abx therapy  to prevent UTIs - POC Urinalysis Dipstick - Comprehensive metabolic panel - Urine Culture - CBC with Differential/Platelet  2. Dysuria  - Urine Culture; Future - POC Urinalysis Dipstick - Urine Culture  3. Type 2 diabetes mellitus with hyperlipidemia (HCC)  - Hemoglobin A1c; Future  4. Insomnia,  unspecified type - Continues to refuse medication     [name], NP

## 2022-06-23 LAB — URINE CULTURE
MICRO NUMBER:: 14155148
SPECIMEN QUALITY:: ADEQUATE

## 2022-06-24 MED ORDER — CEPHALEXIN 250 MG PO CAPS: 250.0000 mg | ORAL_CAPSULE | Freq: Every day | ORAL | 1 refills | Status: DC

## 2022-06-24 NOTE — Addendum Note (Signed)
Addended by: Gwenyth Ober R on: 06/24/2022 02:38 PM   Modules accepted: Orders

## 2022-07-01 ENCOUNTER — Encounter: Payer: Self-pay | Admitting: Podiatrist

## 2022-07-01 ENCOUNTER — Ambulatory Visit (INDEPENDENT_AMBULATORY_CARE_PROVIDER_SITE_OTHER): Payer: Medicare Other | Admitting: Podiatrist

## 2022-07-01 DIAGNOSIS — L600 Ingrowing nail: Secondary | ICD-10-CM

## 2022-07-01 DIAGNOSIS — I6523 Occlusion and stenosis of bilateral carotid arteries: Secondary | ICD-10-CM | POA: Diagnosis not present

## 2022-07-01 NOTE — [place] Notes (Signed)
Chief Complaint  Patient presents with   Ingrown Toenail    Possible bilateral ingrown toenail     HPI: Patient is 88 y.o. female who presents today with her son for pain on the lateral side of the left and right great toenails. She relates she saw the pedicurist at her independent living facility and after this she started having pain in the toes. Her pedicurist trimmed more yesterday and she relates some improvement. The right is more painful than the left. She is on bloodthinners and currently takes keflex 250 daily to prevent uti's.   Patient Active Problem List   Diagnosis Date Noted   Acute cystitis without hematuria 93/79/0240   Chronic systolic congestive heart failure ([place]) 02/03/2022   Pain of upper abdomen 12/12/2021   Difficulty swallowing pills 12/12/2021   Palliative care encounter 11/23/2021   Malnutrition of moderate degree 11/09/2021   Type 2 diabetes mellitus with hyperlipidemia ([place]) 11/08/2021   DNR (do not resuscitate) 11/08/2021   Leg cramps 09/03/2021   Atrial fibrillation with rapid ventricular response ([place]) 09/02/2021   Elevated troponin 09/02/2021   Lab test positive for detection of COVID-19 virus    Acute on chronic combined systolic and diastolic CHF (congestive heart failure) ([place])    Hyperlipidemia associated with type 2 diabetes mellitus ([place])    Overweight (BMI 25.0-29.9)    COVID-19 virus infection 06/07/2021   Generalized weakness 06/07/2021   GERD (gastroesophageal reflux disease) 06/07/2021   CHF (congestive heart failure) ([place]) 05/05/2021   GI bleed 04/13/2021   Anemia 04/13/2021   Symptomatic anemia 04/13/2021   Stage 3b chronic kidney disease ([place]) 04/13/2021   Acute on chronic systolic CHF (congestive heart failure) ([place]) 01/29/2021   Cognitive deficits    Pressure injury of skin 12/19/2020   COPD (chronic obstructive pulmonary disease) ([place]) 09/02/2020   Pulmonary nodule 1 cm or greater in diameter 01/03/2020   S/P TAVR (transcatheter  aortic valve replacement) 05/03/2019   Primary osteoarthritis involving multiple joints 05/03/2019   Persistent atrial fibrillation ([place]) 05/03/2019   Bilateral carotid artery stenosis 12/01/2017   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/13/2014   Aortic valve disorder 05/03/2014   Chronic obstructive bronchitis without exacerbation 05/03/2014   Derangement of left knee 05/03/2014   Esophageal reflux 05/03/2014   HTN (hypertension) 05/03/2014   Vitamin D deficiency 05/03/2014    Current Outpatient Medications on File Prior to Visit  Medication Sig Dispense Refill   acetaminophen (TYLENOL) 500 MG tablet Take 500 mg by mouth 3 (three) times daily as needed for moderate pain. Take with Tramadol     apixaban (ELIQUIS) 5 MG TABS tablet Take 1 tablet (5 mg total) by mouth 2 (two) times daily. 180 tablet 1   atorvastatin (LIPITOR) 40 MG tablet TAKE 1 TABLET BY MOUTH DAILY AT 6 PM. 90 tablet 1   cephALEXin (KEFLEX) 250 MG capsule Take 1 capsule (250 mg total) by mouth daily. 90 capsule 1   cholecalciferol (VITAMIN D3) 25 MCG (1000 UNIT) tablet Take 1,000 Units by mouth 2 (two) times daily.     Coenzyme Q10 (CO Q-10) 300 MG CAPS Take 300 mg by mouth in the morning.     Ferrous Gluconate 324 (37.5 Fe) MG TABS Take 1 tablet (324 mg total) by mouth daily with breakfast. (Patient taking differently: Take 324 mg by mouth daily with breakfast. Every other day) 90 tablet 3   Lidocaine-Menthol, Spray, 4-1 % LIQD Apply 1 spray topically daily as needed (back/knee pain/restless legs).  Magnesium 200 MG TABS Take 200 mg by mouth every evening.     metoprolol succinate (TOPROL-XL) 50 MG 24 hr tablet TAKE 1 TABLET (50 MG TOTAL) BY MOUTH TWICE A DAY .TAKE WITH OR IMMEDIATELY FOLLOWING A MEAL 180 tablet 1   Multiple Vitamin (MULTIVITAMIN WITH MINERALS) TABS tablet Take 1 tablet by mouth 2 (two) times daily. Centrum Minis     potassium chloride 20 MEQ/15ML (10%) SOLN Take 15 mLs (20 mEq total) by mouth daily.  (Patient taking differently: Take 20 mEq by mouth daily. 7 Mls daily) 1350 mL 3   psyllium (METAMUCIL) 58.6 % powder Take 1 packet by mouth daily as needed (for constipation).     spironolactone (ALDACTONE) 25 MG tablet TAKE 1/2 TABLET BY MOUTH DAILY 45 tablet 1   terazosin (HYTRIN) 10 MG capsule TAKE 1 CAPSULE BY MOUTH AT BEDTIME. 90 capsule 1   torsemide (DEMADEX) 20 MG tablet TAKE 2 TABLETS (40 MG TOTAL) BY MOUTH DAILY. 180 tablet 1   vitamin B-12 (CYANOCOBALAMIN) 1000 MCG tablet Take 1 tablet (1,000 mcg total) by mouth daily. 30 tablet 3   No current facility-administered medications on file prior to visit.    Allergies  Allergen Reactions   Amlodipine Besy-Benazepril Hcl Cough   Hctz [Hydrochlorothiazide] Other (See Comments)    Per family, every diuretic the patient has been on flushes out her out and ultimately results in CRAMPING/JAUNDICE (added potassium might help)   Other Other (See Comments)    History of diverticulitis- CANNOT TOLERATE NUTS, CORN, AND ANY FOODS NOT EASILY DIGESTED- the patient AVOIDS these   Cyclobenzaprine Other (See Comments)    Confusion- does not tolerate well    Diclofenac Sodium Diarrhea   Hydralazine Other (See Comments)    Per family, every diuretic the patient has been on flushes out her out and ultimately results in CRAMPING/JAUNDICE (added potassium might help)   Methylprednisolone Other (See Comments)    Medrol/CORTICOSTEROIDS = A LOT OF ENERGY   Oxycodone Other (See Comments)    Patient becomes very disorientated     Review of Systems No fevers, chills, nausea, muscle aches, no difficulty breathing, no calf pain, no chest pain or shortness of breath.   Physical Exam  GENERAL APPEARANCE: Alert, conversant. Appropriately groomed. No acute distress.   VASCULAR: Pedal pulses palpable 2/4 DP and 2/4 PT bilateral.  Capillary refill time is immediate to all digits,  Proximal to distal cooling is warm to warm.  Digital perfusion adequate.    NEUROLOGIC: sensation is intact to 5.07 monofilament at 5/5 sites bilateral.  Light touch is intact bilateral, vibratory sensation intact bilateral  MUSCULOSKELETAL: acceptable muscle strength, tone and stability bilateral.  Bone spurs dorsal midfoot seen bilateral  No pain, crepitus or limitation noted with foot and ankle range of motion bilateral.   DERMATOLOGIC: skin is warm, supple, and dry.  Bilateral hallux nails have mild ingrowns present.  Mild swelling and irritation lateral border of the right hallux nail is noted.  Nail is growing into the skin of the left hallux, lateral border. No drainage noted, no malodor or edema seen. Small scab on the dorsum of the left foot- appears from a bug bite or shoe rubbing.  Assessment    ICD-10-CM   1. Ingrown left greater toenail  L60.0     2. Ingrown right greater toenail  L60.0         Plan  Exam findings and treatment recommendations are discussed.  Recommended epsom salt soaks and use of  antibiotic ointment on the nails.  Recommended takign 500 mg of keflex per day for 5 days.  She has plenty of the medication already. Discussed that if this isn't helpful, I can anesthetize the right great toenail and remove the lateral border.  I am hoping it will resolve over the next 1-2 weeks with conservative care.  Also debrided the nails in a slant back procedure with no anesthesia needed.  Return appointment has been set up in case the nail procedure is needed.

## 2022-07-01 NOTE — Patient Instructions (Signed)
Soak in epsom salts,  apply antibiotic ointment on the toes daily.    We will set up an appointment for next week and if the toe is still painful, I can remove a corner.  Hopefully it will have improved by soaking and using the antibiotics.  Double your cephalexin for the next 5 days-

## 2022-07-06 ENCOUNTER — Encounter: Payer: Self-pay | Admitting: Family Medicine

## 2022-07-06 ENCOUNTER — Non-Acute Institutional Stay: Payer: Medicare Other | Admitting: Family Medicine

## 2022-07-06 VITALS — BP 118/75 | HR 69 | Temp 97.6°F | Resp 24

## 2022-07-06 DIAGNOSIS — R451 Restlessness and agitation: Secondary | ICD-10-CM | POA: Insufficient documentation

## 2022-07-06 DIAGNOSIS — R4189 Other symptoms and signs involving cognitive functions and awareness: Secondary | ICD-10-CM

## 2022-07-06 DIAGNOSIS — Z638 Other specified problems related to primary support group: Secondary | ICD-10-CM

## 2022-07-06 DIAGNOSIS — N1832 Chronic kidney disease, stage 3b: Secondary | ICD-10-CM

## 2022-07-06 NOTE — Progress Notes (Signed)
[facility] [facility] Palliative Care Consult Note Telephone: [phone]  Fax: [phone]    Date of encounter: 07/06/22 11:16 AM PATIENT NAME: Tara Fields [name] [id number]   [id number] (home)  DOB: 02-Sep-1927 MRN: [id number] PRIMARY CARE PROVIDER:    [name], NP,  [place] [address] [phone]  REFERRING PROVIDER:   [name], NP [address] [place],  [place] [address] [phone]  RESPONSIBLE PARTY:    Contact Information     Name Relation Home Work [employer] Son [phone]  [phone]        I met face to face with patient and son Tara Fields in her AL facility. Palliative Care was asked to follow this patient by consultation request of  Nafziger, Tommi Rumps, NP to address advance care planning and complex medical decision making. This is a follow up visit    ASSESSMENT , SYMPTOM MANAGEMENT AND PLAN / RECOMMENDATIONS:  1.  Cognitive Deficits Persistent AMS with paranoia, confusion and agitation Likely some form of dementia-pt and son are resistive to Seroquel even in low dose Attempting to provide education to son on normal course of dementia with confusion, particularly worse with infection or changes in routine, sometimes paranoia, agitation, sundowning. Advise to not engage pt directly but attempt redirection or if safe, to allow her time to calm down. Made appointment with patient, explained Palliative Service and that no-one currently is discussing placement. Patient may require an involuntary psych commitment if she becomes a danger to herself and needs medication adjustment.  Son or provider would have to address with the magistrate to have her held, then she will be assessed in ER for medical stability and evaluated by psychiatry.  2.   Anxiety with Agitation Patient's paranoia with regard to motives of family and son leads to significant  anxiety/agitation and tachypnea. Attempted to get pt to try to control and do pursed lip breathing to slow down respirations and rate. Asked son to leave to give her time to calm herself and worked on Set designer with pt.  3.  Stress due to family tension Pt significantly distrusting of son, his family and their motives. Made follow up appt with pt as has been done previously but pt does not remember details. Distrust and agitation/anger from patient towards son creating significant distress for him in trying to care for her.  4.  Acute on CKD stage 3a Cr baseline 1.1-1.2, eGFR 39-42 Current Cr elevated at 1.83, BUN 57 and eGFR declined to 23.71. There was a 3 mm calculus in the lower pole of left kidney on ct chest, abd and pelvis with contrast on 04/23/22. Recommend slow IV hydration of 500 cc bolus, renal US to check for growth or movement of the stone given blood and white cells, bacteria and worsening renal function, also to check for hydronephrosis. Repeat CBC with diff and CMP to assess for infection.  Likely some related to dehydration, pt did have a normal wbc with left shift in neutrophil count CT chest abd pelvis done 04/23/22 demonstrated a patchy infiltrate in medial LUL field, patchy infiltrate in superior segment of left lower lobe with possible interval worsening.  Small loculated effusion in the posterior left mid lung field. Question if the lung is source of potential infection and if CT chest would be of help vs CXR to look at loculated effusion. Differential changes could also be related to bilat ingrown toenails which appears to be  resolving.  If no identifiable issue noted on renal US may benefit from evaluation by nephrology.     Advance Care Planning/Goals of Care: Goals include to maximize quality of life and symptom management.  Review of an existing advance directive document-MOST . Decision not to resuscitate or to de-escalate disease focused treatments due to  poor prognosis. CODE STATUS: MOST as of 12/30/21: DNR/DNI with limited additional intervention Use of antibiotics if indicated Use of IV fluids and feeding tube on a case by case, time limited basis .      Follow up Palliative Care Visit: Palliative care will continue to follow for complex medical decision making, advance care planning, and clarification of goals. Return 4  weeks or prn.    This visit was coded based on medical decision making (MDM).  PPS: 50%  HOSPICE ELIGIBILITY/DIAGNOSIS: TBD  Chief Complaint:  Palliative Care is continuing to follow patient for chronic medical management in setting of heart failure.  Pt significantly agitated with rapid breathing and high anxiety on arrival.  HISTORY OF PRESENT ILLNESS:  [name] is a 88 y.o. year old female with chronic combined systolic and diastolic heart failure, COPD, CAD, aortic stenosis status post TAVR, bilateral carotid artery stenosis, hypertension, persistent atrial fibrillation, GERD, history of GI bleed, diabetes, hyperlipidemia, OA of multiple joints, CKD, diverticulitis, vitamin D deficiency, cognitive deficit, anemia, leg cramps, malnutrition, pain of upper abdomen and difficulty swallowing pills so she has changed to liquid potassium. Has chronic bilateral knee pain. She said she has "no problems with my memory" and that the doctor told her that as well.  Pt has confusion at times on whether it is day or night.  She said to this provider that it was 6 pm but it was actually 11 am. Tara Fields says she kicked her walker accidentally and hurt her foot but pt says she did not, that she had ingrown toenails.  She had both feet affected when he made an appointment and took her to the Podiatrist they thought her foot was infected and increased her daily Cephalexin to twice daily x 5 days then resume one daily started by PCP for prophylaxis for UTI. She had completed a course of antibiotics for presumed UTI when she began to  have increased agitation so son was directed that she would need to leave a urine to have tested to see if there was a UTI, what organism was growing and what the appropriate treatment would be.  He took her in and urine culture was inconclusive. She was noted to have very short, rapid shallow inspiration and a somewhat forced exhalation, was tachypneic and dyspneic. She states she got very upset with a notification letter of today's appointment that was sent to [facility] and "I didn't ask for this. It was set up with him and I didn't agree to that."  Indicated to pt my name on the letter and that it was an appointment reminder that they use to notify her of today's appointment.'  She asked about the appointment on [facility].  Explained that this is where the hospice home is located which is run by [employer] and the main offices were in front of the hospice home.  Advised she was not currently a candidate for [facility] as she had to have a limited time frame of 2 weeks or less and have acute symptom management needs. Advised for Hospice services she would have to have a limited life expectancy of 6 months or less but she  was not currently at that level.   and said she thought her son was trying to place her in a SNF.  Despite multiple attempts by him and by myself she is convinced he doesn't like her, doesn't want to spend time with her or do things for her and that his kids "never come around unless they want money."  She states she hasn't see any of them for 2 years until recently and they were asking for money.  He reminded her that they were asking him for money.  She states she has nothing to do at the facility, doesn't play bingo, or go down to the dining room and she had a sum of money when she came here. Every time he attempted to talk, she made an accusation or talked over him.  When he offered to find someone he could pay to help her she told him she wasn't paying 16-20/hr for someone to help her.  He  told her he would fix her shower head for her when he came back with the right tools and she insisted she wanted the ALF maintenance people to fix it.  She stopped going to the dining room because she would become out of breath when about halfway there but perseverates on the fact that she can't go out her patio door and down the steps.  When scheduling the follow up advised that we could set it up with Tara Fields or he didn't have to be present.  She states "Oh he doesn't bother me" yet in the next breath she insists she can do things for herself and doesn't need his help. When this provider tried to talk her through doing some slow breathing exercises she states "well you try it, you know I have feelings!"  Advised that I understood it was not simple but the degree of anxiety and agitation she was displaying wasn't good for her.  She talked over this provider as well but then she states "You are welcome to come back but don't come for me, I know you have a lot to do."  Advised pt that this was part of my job to help her manage any symptoms she needs help managing.  She is very tearful and talks about "past" wrongs done to her by Tara Fields and his family.  Encouraged Tara Fields to leave around that time to allow her time to calm down.  Tara Fields had indicated that she would normally start having some difficulty around 4 pm with increased confusion.  History obtained from review of EMR, discussion with  Ms. Johannsen.   Notes from Podiatry do indicate bilateral ingrown toenails.  Pt has been soaking in Epsom salts and pt has return appt if needed to have anesthestized and nail trimmed.  Nail was debrided 07/01/22.       Latest Ref Rng & Units 06/22/2022    2:05 PM 04/23/2022    5:55 PM 12/02/2021   10:05 AM  CBC  WBC 4.0 - 10.5 K/uL 10.1  7.4  5.5   Hemoglobin 12.0 - 15.0 g/dL 13.5  11.8  11.5   Hematocrit 36.0 - 46.0 % 40.9  34.6  34.7   Platelets 150.0 - 400.0 K/uL 171.0  199  233.0   06/22/22 HGB A1c 6.8%  06/22/22  Urine culture with mixed urogenital flora   I reviewed EMR for available labs, medications, imaging, studies and related documents.  There are no new records since last visit.   ROS General: NAD ENMT: denies dysphagia  Cardiovascular: denies chest pain, endorses mild DOE Pulmonary: denies cough, denies increased SOB Abdomen: endorses poor appetite, denies constipation, endorses continence of bowel GU: denies dysuria, endorses continence of urine, increased urinary frequency MSK:  denies increased weakness, no falls reported Skin: denies rashes or wounds Neurological: denies pain, endorses continued insomnia but has refused seroquel low dose for sleep Psych: Endorses poor mood, agitation  Heme/lymph/immuno: denies bruises, abnormal bleeding  Physical Exam: Current and past weights:  135 lbs last Monday  Constitutional: NAD General: WNWD CV: S1S2, Mildly fast IRIR, no LE edema Pulmonary: CTAB, tachypnea from agitation, no cough, room air Abdomen: normo-active BS + 4 quadrants, soft and non tender GU: deferred MSK: no sarcopenia, moves all extremities, ambulatory with rollator, uses scooter for long distances Skin: warm and dry, no rashes or wounds on visible skin Neuro:  no generalized weakness,  cognitive impairment with sundowning behavior Psych: highly anxious affect, tangential thought process, paranoia, confusion Hem/lymph/immuno: no widespread bruising   Thank you for the opportunity to participate in the care of Ms. Bonelli.  The palliative care team will continue to follow. Please call our office at [phone] if we can be of additional assistance.   Marijo Conception, FNP -C  COVID-19 PATIENT SCREENING TOOL Asked and negative response unless otherwise noted:   Have you had symptoms of covid, tested positive or been in contact with someone with symptoms/positive test in the past 5-10 days?  No

## 2022-07-16 ENCOUNTER — Encounter: Payer: Self-pay | Admitting: Podiatrist

## 2022-07-16 ENCOUNTER — Ambulatory Visit (INDEPENDENT_AMBULATORY_CARE_PROVIDER_SITE_OTHER): Payer: Medicare Other | Admitting: Podiatrist

## 2022-07-16 DIAGNOSIS — L600 Ingrowing nail: Secondary | ICD-10-CM

## 2022-07-16 DIAGNOSIS — I6523 Occlusion and stenosis of bilateral carotid arteries: Secondary | ICD-10-CM | POA: Diagnosis not present

## 2022-07-16 NOTE — Progress Notes (Signed)
Chief Complaint  Patient presents with   Nail Problem     Left foot routine nail trim     HPI: Patient is 88 y.o. female who presents today with the assistance of her son for recheck lateral nail borders right and left great toenails.  She still relates she gets a tinge of pain in the toenails at times however it is not consistent and not as uncomfortable as it was last week.  She relates that she took 500 mg of Keflex daily for 5 days and has been soaking in Epsom salt soaks daily.  Denies seeing any pus or purulence, relates the discomfort has decreased.  Tara Fields takes Eliquis  Patient Active Problem List   Diagnosis Date Noted   Anxiety with agitation 07/06/2022   Stress due to family tension 07/06/2022   Acute cystitis without hematuria 25/12/3974   Chronic systolic congestive heart failure ([place]) 02/03/2022   Pain of upper abdomen 12/12/2021   Difficulty swallowing pills 12/12/2021   Palliative care encounter 11/23/2021   Malnutrition of moderate degree 11/09/2021   Type 2 diabetes mellitus with hyperlipidemia ([place]) 11/08/2021   DNR (do not resuscitate) 11/08/2021   Leg cramps 09/03/2021   Atrial fibrillation with rapid ventricular response ([place]) 09/02/2021   Elevated troponin 09/02/2021   Lab test positive for detection of COVID-19 virus    Acute on chronic combined systolic and diastolic CHF (congestive heart failure) ([place])    Acute renal failure superimposed on stage 3b chronic kidney disease ([place])    Hyperlipidemia associated with type 2 diabetes mellitus ([place])    Overweight (BMI 25.0-29.9)    COVID-19 virus infection 06/07/2021   Generalized weakness 06/07/2021   GERD (gastroesophageal reflux disease) 06/07/2021   CHF (congestive heart failure) ([place]) 05/05/2021   GI bleed 04/13/2021   Anemia 04/13/2021   Symptomatic anemia 04/13/2021   Stage 3b chronic kidney disease ([place]) 04/13/2021   Acute on chronic systolic CHF (congestive heart failure) ([place]) 01/29/2021   Cognitive  deficits    Pressure injury of skin 12/19/2020   COPD (chronic obstructive pulmonary disease) ([place]) 09/02/2020   Pulmonary nodule 1 cm or greater in diameter 01/03/2020   S/P TAVR (transcatheter aortic valve replacement) 05/03/2019   Primary osteoarthritis involving multiple joints 05/03/2019   Persistent atrial fibrillation ([place]) 05/03/2019   Bilateral carotid artery stenosis 12/01/2017   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/13/2014   Aortic valve disorder 05/03/2014   Chronic obstructive bronchitis without exacerbation 05/03/2014   Derangement of left knee 05/03/2014   Esophageal reflux 05/03/2014   HTN (hypertension) 05/03/2014   Vitamin D deficiency 05/03/2014    Current Outpatient Medications on File Prior to Visit  Medication Sig Dispense Refill   acetaminophen (TYLENOL) 500 MG tablet Take 500 mg by mouth 3 (three) times daily as needed for moderate pain. Take with Tramadol     apixaban (ELIQUIS) 5 MG TABS tablet Take 1 tablet (5 mg total) by mouth 2 (two) times daily. 180 tablet 1   atorvastatin (LIPITOR) 40 MG tablet TAKE 1 TABLET BY MOUTH DAILY AT 6 PM. 90 tablet 1   cephALEXin (KEFLEX) 250 MG capsule Take 1 capsule (250 mg total) by mouth daily. 90 capsule 1   cholecalciferol (VITAMIN D3) 25 MCG (1000 UNIT) tablet Take 1,000 Units by mouth 2 (two) times daily.     Coenzyme Q10 (CO Q-10) 300 MG CAPS Take 300 mg by mouth in the morning.     Ferrous Gluconate 324 (37.5 Fe) MG TABS Take 1  tablet (324 mg total) by mouth daily with breakfast. (Patient taking differently: Take 324 mg by mouth daily with breakfast. Every other day) 90 tablet 3   Lidocaine-Menthol, Spray, 4-1 % LIQD Apply 1 spray topically daily as needed (back/knee pain/restless legs).     Magnesium 200 MG TABS Take 200 mg by mouth every evening.     metoprolol succinate (TOPROL-XL) 50 MG 24 hr tablet TAKE 1 TABLET (50 MG TOTAL) BY MOUTH TWICE A DAY .TAKE WITH OR IMMEDIATELY FOLLOWING A MEAL 180 tablet 1   Multiple  Vitamin (MULTIVITAMIN WITH MINERALS) TABS tablet Take 1 tablet by mouth 2 (two) times daily. Centrum Minis     potassium chloride 20 MEQ/15ML (10%) SOLN Take 15 mLs (20 mEq total) by mouth daily. (Patient taking differently: Take 20 mEq by mouth daily. 7 Mls daily) 1350 mL 3   psyllium (METAMUCIL) 58.6 % powder Take 1 packet by mouth daily as needed (for constipation).     spironolactone (ALDACTONE) 25 MG tablet TAKE 1/2 TABLET BY MOUTH DAILY 45 tablet 1   terazosin (HYTRIN) 10 MG capsule TAKE 1 CAPSULE BY MOUTH AT BEDTIME. 90 capsule 1   torsemide (DEMADEX) 20 MG tablet TAKE 2 TABLETS (40 MG TOTAL) BY MOUTH DAILY. 180 tablet 1   vitamin B-12 (CYANOCOBALAMIN) 1000 MCG tablet Take 1 tablet (1,000 mcg total) by mouth daily. 30 tablet 3   No current facility-administered medications on file prior to visit.    Allergies  Allergen Reactions   Amlodipine Besy-Benazepril Hcl Cough   Hctz [Hydrochlorothiazide] Other (See Comments)    Per family, every diuretic the patient has been on flushes out her out and ultimately results in CRAMPING/JAUNDICE (added potassium might help)   Other Other (See Comments)    History of diverticulitis- CANNOT TOLERATE NUTS, CORN, AND ANY FOODS NOT EASILY DIGESTED- the patient AVOIDS these   Cyclobenzaprine Other (See Comments)    Confusion- does not tolerate well    Diclofenac Sodium Diarrhea   Hydralazine Other (See Comments)    Per family, every diuretic the patient has been on flushes out her out and ultimately results in CRAMPING/JAUNDICE (added potassium might help)   Methylprednisolone Other (See Comments)    Medrol/CORTICOSTEROIDS = A LOT OF ENERGY   Oxycodone Other (See Comments)    Patient becomes very disorientated     Review of Systems No fevers, chills, nausea, muscle aches, no difficulty breathing, no calf pain, no chest pain or shortness of breath.   Physical Exam  GENERAL APPEARANCE: Alert, conversant. Appropriately groomed. No acute distress.    VASCULAR: Pedal pulses palpable 2/4 DP and 1/4 PT bilateral.  Multiple varicosities present.  Proximal to distal cooling is warm to warm.  Digital perfusion adequate.   NEUROLOGIC: sensation is intact bilateral  MUSCULOSKELETAL:   No pain, crepitus or limitation noted with foot and ankle range of motion bilateral.   DERMATOLOGIC: skin is warm, supple, and dry.  Right and left hallux nails lateral nail borders have improved.  No pain with compression is noted.  No redness, no irritation, no swelling is seen.  No sign of infection is noted.  Slight potential for recurring ingrown's due to the curvature of the nail lateral nail border bilateral hallux seen however no painful ingrown nail is noted today.  Remainder of toenails are slightly elongated.   Assessment   Recheck ingrown toenails-lateral nail borders bilateral hallux Plan  Recommended continued Epsom salt soaks, continued use of antibiotics along the lateral nail fold until the  nails grow out longer and are not pressing so much into the skin.  Discussed in the future we could perform an ingrown nail procedure however would like to avoid that if we can due to the time of healing after the procedure.  She will keep an eye on the nails and if they start to hurt again she will let us know otherwise she will be seen back as needed for follow-up.  Debridement of the remainder of the nails was accomplished today without complication as a courtesy.

## 2022-07-30 ENCOUNTER — Encounter: Payer: Self-pay | Admitting: Family Medicine

## 2022-07-30 ENCOUNTER — Non-Acute Institutional Stay: Payer: Medicare Other | Admitting: Family Medicine

## 2022-07-30 VITALS — BP 122/60 | HR 71 | Temp 97.8°F | Resp 22

## 2022-07-30 DIAGNOSIS — L989 Disorder of the skin and subcutaneous tissue, unspecified: Secondary | ICD-10-CM

## 2022-07-30 NOTE — Progress Notes (Signed)
[facility] Consult Note Telephone: [phone]  Fax: [phone]    Date of encounter: 07/30/22 1:48 PM PATIENT NAME: Tara Fields [id number]   [phone] (home)  DOB: 06-12-1928 MRN: [id number] PRIMARY CARE PROVIDER:    [name], NP,  [place] [address] [phone]  REFERRING PROVIDER:   [name], NP [address] [place],  [place] [address] [phone]  RESPONSIBLE PARTY:    Contact Information     Name Relation Home Work [place] Son [phone]  [phone]        I met face to face with patient and son Louie Casa in her AL facility. Palliative Care was asked to follow this patient by consultation request of  Nafziger, Tommi Rumps, NP to address advance care planning and complex medical decision making. This is a follow up visit    ASSESSMENT , SYMPTOM MANAGEMENT AND PLAN / RECOMMENDATIONS:    Hard skin lesion Appears to be self limited and spontaneously resolving. Encouraged pt to use Zinc barrier cream to keep pad from rubbing area, increasing soreness. Continue ice packs 10-15 min on/30 min off. Can also use warm sitz baths 2-3 times per day. Call if increased pain, drainage, fever or flu like symptoms. Reassurance provided to pt and son.    Advance Care Planning/Goals of Care: Goals include to maximize quality of life and symptom management.  Review of an existing advance directive document-MOST . Decision not to resuscitate or to de-escalate disease focused treatments due to poor prognosis. CODE STATUS: MOST as of 12/30/21: DNR/DNI with limited additional intervention Use of antibiotics if indicated Use of IV fluids and feeding tube on a case by case, time limited basis .      Follow up Palliative Care Visit: Palliative care will continue to follow for complex medical decision making, advance care planning, and  clarification of goals. Return 4  weeks or prn.    This visit was coded based on medical decision making (MDM).  PPS: 50%  HOSPICE ELIGIBILITY/DIAGNOSIS: TBD  Chief Complaint:  Acute visit for "boil" on buttocks with increasing pain getting worse over the last 2 days.    HISTORY OF PRESENT ILLNESS:  Tara Fields is a 88 y.o. year old female with chronic combined systolic and diastolic heart failure, COPD, CAD, aortic stenosis status post TAVR, bilateral carotid artery stenosis, hypertension, persistent atrial fibrillation, GERD, history of GI bleed, diabetes, hyperlipidemia, OA of multiple joints, CKD, diverticulitis, vitamin D deficiency, cognitive deficit, anemia, leg cramps, malnutrition, pain of upper abdomen and difficulty swallowing pills so she has changed to liquid potassium. Has chronic bilateral knee pain. Received a call from pt's son wanting advice about a "boil" pt has on her buttocks and whether or not he should take her to the doctor to have it evaluated. Advised that this NP will make an acute visit. Has baseline SOB.  Denies nausea, dysuria, falls.  Pt states noting a "sore" area on her buttocks that became worse with sitting and much more uncomfortable.  Denies fever, noting any drainage from the area or bleeding but states it got progressively worse over several days and she was uncomfortable to have son look at it.  She stopped using her comfort cushion she had been sitting on as it rubbed against the area and made it more uncomfortable.  She did get an ice pack and was sitting on this which did give her some relief.  She states that the area was made even more uncomfortable from the friction of the incontinence pad she wears in her underwear.    History obtained from review of EMR, discussion with  Ms. Hord.    I reviewed EMR for available labs, medications, imaging, studies and related documents.  There are no new records since last visit.   ROS General:  NAD ENMT: denies dysphagia Cardiovascular: denies chest pain, endorses mild DOE Pulmonary: denies cough, denies increased SOB Abdomen: endorses poor appetite, endorses intermittent constipation, endorses continence of bowel GU: denies dysuria, endorses some urge incontinence of urine MSK:  denies increased weakness, no falls reported Skin: see HPI above Neurological: endorses slightly improved sleep when she gets to sleep later Psych: mood better Heme/lymph/immuno: denies bruises, abnormal bleeding  Physical Exam: Current and past weights:  135 lbs last Monday  Constitutional: NAD General: WNWD CV: S1S2, RRR with no MRG, no LE edema Pulmonary: CTAB, mild tachypnea, no cough, room air Abdomen: mild abd distension, normo-active BS + 4 quadrants, soft and non tender GU: deferred MSK: no sarcopenia, moves all extremities, ambulatory with rollator, uses scooter for long distances Skin: warm and dry, 12 mm circular lesion on right buttock/perineum with 10 mm flat white central hard core, no surrounding erythema or drainage. Neuro:  no generalized weakness, cognitive impairment with sundowning behavior Psych: alert and oriented x 2 Hem/lymph/immuno: no widespread bruising   Thank you for the opportunity to participate in the care of Tara Fields.  The palliative care team will continue to follow. Please call our office at [phone] if we can be of additional assistance.   Marijo Conception, FNP -C  COVID-19 PATIENT SCREENING TOOL Asked and negative response unless otherwise noted:   Have you had symptoms of covid, tested positive or been in contact with someone with symptoms/positive test in the past 5-10 days?  No

## 2022-08-03 ENCOUNTER — Encounter: Payer: Medicare Other | Admitting: Family Medicine

## 2022-08-06 ENCOUNTER — Encounter: Payer: Self-pay | Admitting: Family Medicine

## 2022-08-06 DIAGNOSIS — L989 Disorder of the skin and subcutaneous tissue, unspecified: Secondary | ICD-10-CM | POA: Insufficient documentation

## 2022-08-09 ENCOUNTER — Emergency Department (HOSPITAL_BASED_OUTPATIENT_CLINIC_OR_DEPARTMENT_OTHER)
Admission: EM | Admit: 2022-08-09 | Discharge: 2022-08-09 | Disposition: A | Discharge status: Home / Self Care | Payer: Medicare Other | Attending: Emergency Medicine | Admitting: Emergency Medicine

## 2022-08-09 ENCOUNTER — Emergency Department (HOSPITAL_BASED_OUTPATIENT_CLINIC_OR_DEPARTMENT_OTHER): Payer: Medicare Other | Admitting: Radiology

## 2022-08-09 ENCOUNTER — Emergency Department (HOSPITAL_BASED_OUTPATIENT_CLINIC_OR_DEPARTMENT_OTHER): Payer: Medicare Other

## 2022-08-09 ENCOUNTER — Other Ambulatory Visit: Payer: Self-pay

## 2022-08-09 DIAGNOSIS — M25561 Pain in right knee: Secondary | ICD-10-CM | POA: Diagnosis present

## 2022-08-09 DIAGNOSIS — Z7901 Long term (current) use of anticoagulants: Secondary | ICD-10-CM | POA: Insufficient documentation

## 2022-08-09 DIAGNOSIS — W19XXXA Unspecified fall, initial encounter: Secondary | ICD-10-CM

## 2022-08-09 DIAGNOSIS — R000 Tachycardia, unspecified: Secondary | ICD-10-CM | POA: Diagnosis not present

## 2022-08-09 MED ORDER — ACETAMINOPHEN 500 MG PO TABS
1000.0000 mg | ORAL_TABLET | Freq: Once | ORAL | Status: AC
  Administered 2022-08-09: 1000 mg via ORAL
  Filled 2022-08-09: qty 2

## 2022-08-09 MED ORDER — LIDOCAINE 5 % EX PTCH
1.0000 | MEDICATED_PATCH | Freq: Once | CUTANEOUS | Status: DC
  Administered 2022-08-09: 1 via TRANSDERMAL
  Filled 2022-08-09: qty 1

## 2022-08-09 NOTE — ED Provider Notes (Signed)
[facility] EMERGENCY DEPT Provider Note   CSN: [id number] Arrival date & time: 08/09/22  1814     History Chief Complaint  Patient presents with   Knee Pain   Fall    Tara Fields is a 88 y.o. female.   Knee Pain Associated symptoms: back pain   Associated symptoms: no fever   Fall Pertinent negatives include no chest pain, no abdominal pain, no headaches and no shortness of breath.  Patient presented to the emergency department following mechanical fall out of her vehicle.  She states that she felt that her right knee gave out when she fell onto the ground with support from her son who was with her.  She denies any loss of consciousness or striking her head against any surface.  Reports that pain in right knee is minimal when not moving.  Denies any chest pain, headache, confusion, shortness of breath, abdominal pain, nausea.     Home Medications Prior to Admission medications   Medication Sig Start Date End Date Taking? Authorizing Provider  acetaminophen (TYLENOL) 500 MG tablet Take 500 mg by mouth 3 (three) times daily as needed for moderate pain. Take with Tramadol    [provider]  apixaban (ELIQUIS) 5 MG TABS tablet Take 1 tablet (5 mg total) by mouth 2 (two) times daily. 05/11/22 08/09/22  Nafziger, Tommi Rumps, NP  atorvastatin (LIPITOR) 40 MG tablet TAKE 1 TABLET BY MOUTH DAILY AT 6 PM. 06/18/22   Nafziger, Tommi Rumps, NP  cephALEXin (KEFLEX) 250 MG capsule Take 1 capsule (250 mg total) by mouth daily. 06/24/22   Nafziger, Tommi Rumps, NP  cholecalciferol (VITAMIN D3) 25 MCG (1000 UNIT) tablet Take 1,000 Units by mouth 2 (two) times daily.    [provider]  Coenzyme Q10 (CO Q-10) 300 MG CAPS Take 300 mg by mouth in the morning.    [provider]  Ferrous Gluconate 324 (37.5 Fe) MG TABS Take 1 tablet (324 mg total) by mouth daily with breakfast. Patient taking differently: Take 324 mg by mouth daily with breakfast. Every other day 09/09/21    Nafziger, Tommi Rumps, NP  Lidocaine-Menthol, Spray, 4-1 % LIQD Apply 1 spray topically daily as needed (back/knee pain/restless legs).    [provider]  Magnesium 200 MG TABS Take 200 mg by mouth every evening.    [provider]  metoprolol succinate (TOPROL-XL) 50 MG 24 hr tablet TAKE 1 TABLET (50 MG TOTAL) BY MOUTH TWICE A DAY .TAKE WITH OR IMMEDIATELY FOLLOWING A MEAL 05/11/22   Nafziger, Tommi Rumps, NP  Multiple Vitamin (MULTIVITAMIN WITH MINERALS) TABS tablet Take 1 tablet by mouth 2 (two) times daily. Centrum Minis    [provider]  potassium chloride 20 MEQ/15ML (10%) SOLN Take 15 mLs (20 mEq total) by mouth daily. Patient taking differently: Take 20 mEq by mouth daily. 7 Mls daily 05/19/21 11/09/21  [name], PA-C  psyllium (METAMUCIL) 58.6 % powder Take 1 packet by mouth daily as needed (for constipation).    [provider]  spironolactone (ALDACTONE) 25 MG tablet TAKE 1/2 TABLET BY MOUTH DAILY 05/11/22   Nafziger, Tommi Rumps, NP  terazosin (HYTRIN) 10 MG capsule TAKE 1 CAPSULE BY MOUTH AT BEDTIME. 05/11/22   Nafziger, Tommi Rumps, NP  torsemide (DEMADEX) 20 MG tablet TAKE 2 TABLETS (40 MG TOTAL) BY MOUTH DAILY. 05/11/22   Nafziger, Tommi Rumps, NP  vitamin B-12 (CYANOCOBALAMIN) 1000 MCG tablet Take 1 tablet (1,000 mcg total) by mouth daily. 04/16/21   Oswald Hillock, MD  Allergies    Amlodipine besy-benazepril hcl, Hctz [hydrochlorothiazide], Other, Cyclobenzaprine, Diclofenac sodium, Hydralazine, Methylprednisolone, and Oxycodone    Review of Systems   Review of Systems  Constitutional:  Negative for fever.  Respiratory:  Negative for chest tightness and shortness of breath.   Cardiovascular:  Negative for chest pain and leg swelling.  Gastrointestinal:  Negative for abdominal pain and nausea.  Musculoskeletal:  Positive for arthralgias and back pain.  Neurological:  Negative for syncope, weakness, numbness and headaches.  All other systems reviewed and are  negative.   Physical Exam Updated Vital Signs BP 125/80 (BP Location: Right Arm)   Pulse 79   Temp 98 F (36.7 C) (Oral)   Resp 16   Ht 5' (1.524 m)   Wt 61 kg   SpO2 100%   BMI 26.26 kg/m  Physical Exam Vitals and nursing note reviewed.  Constitutional:      General: She is not in acute distress.    Appearance: Normal appearance. She is not ill-appearing.  HENT:     Head: Normocephalic and atraumatic.  Cardiovascular:     Rate and Rhythm: Regular rhythm. Tachycardia present.     Pulses: Normal pulses.  Pulmonary:     Effort: Pulmonary effort is normal.     Breath sounds: Normal breath sounds.  Musculoskeletal:        General: Tenderness present. No swelling, deformity or signs of injury. Normal range of motion.     Comments: Range of motion right knee is painful but at baseline.  Skin:    General: Skin is warm and dry.     Capillary Refill: Capillary refill takes less than 2 seconds.     Findings: No bruising, erythema, lesion or rash.  Neurological:     Mental Status: She is alert.     ED Results / Procedures / Treatments   Labs (all labs ordered are listed, but only abnormal results are displayed) Labs Reviewed - No data to display  EKG None  Radiology DG Hip Unilat With Pelvis 2-3 Views Right  Result Date: 08/09/2022 CLINICAL DATA:  Fall injury with low back pain and right hip pain. EXAM: LUMBAR SPINE - COMPLETE 4+ VIEW; DG HIP (WITH OR WITHOUT PELVIS) 2-3V RIGHT COMPARISON:  CT chest, abdomen and pelvis and reconstructions 04/23/2022. FINDINGS: Lumbar spine, routine five views: There is diffuse osteopenia. There is preservation of the normal lumbar vertebral heights, without appreciable fractures. Again noted is grade 1 discogenic anterolisthesis L3-4, grade 1 discogenic retrolisthesis L2-3, and a slight dextroscoliosis. There is no new or further alignment abnormality. There are posterior fusion plates with bilateral pedicle screws L4-S1, with spinous process  shaving and bone grafting and with solid fusions. There is disc collapse with bulky marginal osteophytes L1-2 through L3-4, and bulky marginal osteophytes with bridging in the lower thoracic spine. Moderate facet hypertrophy seen at L1-2 through L3-4, where there is bilateral foraminal stenosis. The SI joints are patent with bilateral spurring and vacuum phenomenon The aorta and common iliac arteries are heavily calcified. Cholecystectomy clips are again shown. AP pelvis and separate AP and frog-leg right hip: Diffuse osteopenia. No fracture seen involving the pelvis and proximal femurs. There is mild to moderate symmetric degenerative arthrosis at the hips with enthesopathic changes of the bony pelvis and greater trochanters. Both femoral arteries are heavily calcified. There is artifact from overlying clothing. IMPRESSION: 1. Diffuse osteopenia, degenerative changes and postsurgical changes without evidence of lumbar spine fractures. 2. No pelvic or proximal femur fracture. 3.  Mild to moderate symmetric degenerative arthrosis of the hips. 4. Enthesopathic changes of the bony pelvis and greater trochanters. 5. Heavy aortoiliac atherosclerosis. 6. Extensive atherosclerosis of the femoral arteries. Electronically Signed   By: Telford Nab M.D.   On: 08/09/2022 20:18   DG Lumbar Spine Complete  Result Date: 08/09/2022 CLINICAL DATA:  Fall injury with low back pain and right hip pain. EXAM: LUMBAR SPINE - COMPLETE 4+ VIEW; DG HIP (WITH OR WITHOUT PELVIS) 2-3V RIGHT COMPARISON:  CT chest, abdomen and pelvis and reconstructions 04/23/2022. FINDINGS: Lumbar spine, routine five views: There is diffuse osteopenia. There is preservation of the normal lumbar vertebral heights, without appreciable fractures. Again noted is grade 1 discogenic anterolisthesis L3-4, grade 1 discogenic retrolisthesis L2-3, and a slight dextroscoliosis. There is no new or further alignment abnormality. There are posterior fusion plates with  bilateral pedicle screws L4-S1, with spinous process shaving and bone grafting and with solid fusions. There is disc collapse with bulky marginal osteophytes L1-2 through L3-4, and bulky marginal osteophytes with bridging in the lower thoracic spine. Moderate facet hypertrophy seen at L1-2 through L3-4, where there is bilateral foraminal stenosis. The SI joints are patent with bilateral spurring and vacuum phenomenon The aorta and common iliac arteries are heavily calcified. Cholecystectomy clips are again shown. AP pelvis and separate AP and frog-leg right hip: Diffuse osteopenia. No fracture seen involving the pelvis and proximal femurs. There is mild to moderate symmetric degenerative arthrosis at the hips with enthesopathic changes of the bony pelvis and greater trochanters. Both femoral arteries are heavily calcified. There is artifact from overlying clothing. IMPRESSION: 1. Diffuse osteopenia, degenerative changes and postsurgical changes without evidence of lumbar spine fractures. 2. No pelvic or proximal femur fracture. 3. Mild to moderate symmetric degenerative arthrosis of the hips. 4. Enthesopathic changes of the bony pelvis and greater trochanters. 5. Heavy aortoiliac atherosclerosis. 6. Extensive atherosclerosis of the femoral arteries. Electronically Signed   By: Telford Nab M.D.   On: 08/09/2022 20:18   DG Knee Right Port  Result Date: 08/09/2022 CLINICAL DATA:  Right knee pain after fall EXAM: PORTABLE RIGHT KNEE - 1-2 VIEW; RIGHT FEMUR PORTABLE 2 VIEW COMPARISON:  None Available. FINDINGS: Demineralization. No definite acute fracture. Tricompartmental hypertrophic degenerative arthritis in the right knee with advanced narrowing of all 3 compartments. Moderate knee joint effusion. IMPRESSION: No definite acute fracture. Advanced osteoarthritis right knee. Moderate knee joint effusion. Electronically Signed   By: Placido Sou M.D.   On: 08/09/2022 20:08   DG FEMUR PORT, MIN 2 VIEWS  RIGHT  Result Date: 08/09/2022 CLINICAL DATA:  Right knee pain after fall EXAM: PORTABLE RIGHT KNEE - 1-2 VIEW; RIGHT FEMUR PORTABLE 2 VIEW COMPARISON:  None Available. FINDINGS: Demineralization. No definite acute fracture. Tricompartmental hypertrophic degenerative arthritis in the right knee with advanced narrowing of all 3 compartments. Moderate knee joint effusion. IMPRESSION: No definite acute fracture. Advanced osteoarthritis right knee. Moderate knee joint effusion. Electronically Signed   By: Placido Sou M.D.   On: 08/09/2022 20:08    Procedures Procedures   Medications Ordered in ED Medications  lidocaine (LIDODERM) 5 % 1 patch (1 patch Transdermal Patch Applied 08/09/22 2114)  acetaminophen (TYLENOL) tablet 1,000 mg (1,000 mg Oral Given 08/09/22 2114)    ED Course/ Medical Decision Making/ A&P                           Medical Decision Making Amount and/or Complexity of Data Reviewed  Radiology: ordered.   This patient presents to the ED for concern of fall.  Differential diagnosis includes head trauma, subdural hematoma, hip fracture, knee dislocation, femur fracture.   Imaging Studies ordered:  I ordered imaging studies including x-ray of lumbar spine, hip/pelvis, femur, knee all right-sided I independently visualized and interpreted imaging which showed no obvious fracture or dislocation but notable osteoarthritis I agree with the radiologist interpretation   Medicines ordered and prescription drug management:  I ordered medication including Tylenol and lidocaine patches for back pain Reevaluation of the patient after these medicines showed that the patient improved I have reviewed the patients home medicines and have made adjustments as needed   Problem List / ED Course:  Presents to emergency departments with a recent ground-level fall when she was training into her car.  She reports that she hit her right knee and hip on the ground and this was the site of  the majority of her pain.  She denies hitting her head against any surface nor losing consciousness.  She reported to me that this episode was not a syncopal event where she lost consciousness or cannot recall what occurred but that her legs felt weak and gave out.  Based on all reassuring x-rays of lumbar spine, hip, femur, knee on right side patient does not need any further evaluation while here in the emergency department.  Discussed with patient that she should follow-up outpatient with an orthopedic specialist to discuss the extent of her arthritis for better pain control and she is currently able to obtain at home.  Patient was agreeable to treatment plan and verbalized understanding all return precautions.  Patient's son is planning on seeing the patient back several days as she attempts to regain her mobility and the pain that she is currently experiencing.  Patient and son did not have any further questions at the end of the assessment.  Final Clinical Impression(s) / ED Diagnoses Final diagnoses:  Fall, initial encounter  Acute pain of right knee    Rx / DC Orders ED Discharge Orders     None         Luvenia Heller, PA-C 08/09/22 [place], DO 08/10/22 1459

## 2022-08-09 NOTE — ED Triage Notes (Signed)
Patient arrives complaints of having a partial fall while getting into her car. Patient states that her legs "gave way" and now she his having right knee pain. She did not hit her head.

## 2022-08-09 NOTE — Discharge Instructions (Signed)
You are seen in the emergency department for right knee and low back pain.  All x-rays of your low back, hip and right knee were all reassuring with no obvious signs of any fractures dislocations but there was significant osteoarthritis noticed on these images.  For better pain relief you should continue to take Tylenol at home consistently.  Please make sure you are able to follow-up with your primary care provider or an orthopedic specialist to discuss further treatment of arthritic pain.

## 2022-08-17 ENCOUNTER — Encounter: Payer: Self-pay | Admitting: Adult Health

## 2022-08-17 ENCOUNTER — Ambulatory Visit (INDEPENDENT_AMBULATORY_CARE_PROVIDER_SITE_OTHER): Payer: Medicare Other | Admitting: Adult Health

## 2022-08-17 VITALS — BP 120/58 | HR 94 | Temp 97.5°F | Ht 60.0 in | Wt 143.0 lb

## 2022-08-17 DIAGNOSIS — M25561 Pain in right knee: Secondary | ICD-10-CM

## 2022-08-17 NOTE — Progress Notes (Signed)
Subjective:    Patient ID: Tara Fields, female    DOB: 1927-10-23, 89 y.o.   MRN: [id number]  Knee Pain    89 year old female who  has a past medical history of Acute exacerbation of CHF (congestive heart failure) ([place]) (12/18/2020), AKI (acute kidney injury) ([place]), Aortic stenosis, CAD (coronary artery disease), COPD (chronic obstructive pulmonary disease) ([place]), Diabetes mellitus ([place]), Diverticulitis, GERD (gastroesophageal reflux disease), HTN (hypertension), Hyperlipidemia, Legally blind in left eye, as defined in [place], Tobacco abuse, and UTI (urinary tract infection) (11/10/2021).  She had a fall the night of Christmas, she was getting in a car and her right knee gave out and she fell hyperextending her knee. She went to the ER and had xrays done which showed no acute fracture but chronic arthritis.   She was then seen at [facility] a day or two after the incident. She reports that " they gave me a shot and put my knee in a brace".   She has been wearing the brace, using heat and ice and taking Tramadol PRN. She does report periods of less pain but continues to be pretty painful overall.   She has a follow up next week with orthopedics    Review of Systems See HPI   Past Medical History:  Diagnosis Date   Acute exacerbation of CHF (congestive heart failure) ([place]) 12/18/2020   AKI (acute kidney injury) ([place])    Aortic stenosis    s/p TAVR 26 mm Edwards Sapien 3 THV July 2020   CAD (coronary artery disease)    COPD (chronic obstructive pulmonary disease) ([facility])    Diabetes mellitus ([place])    Diverticulitis    GERD (gastroesophageal reflux disease)    HTN (hypertension)    Hyperlipidemia    Legally blind in left eye, as defined in [place]    Tobacco abuse    UTI (urinary tract infection) 11/10/2021    Social History   Socioeconomic History   Marital status: Widowed    Spouse name: Not on file   Number of children: 2   Years of education: Not on file   Highest education  level: 12th grade  Occupational History   Occupation: Retired    Comment: homemaker  Tobacco Use   Smoking status: Former    Packs/day: 1.00    Years: 70.00    Total pack years: 70.00    Types: Cigarettes    Quit date: 12/15/2019    Years since quitting: 2.6   Smokeless tobacco: Never   Tobacco comments:    Pt states quiting a year ago. 01/03/20 ARJ  Vaping Use   Vaping Use: Never used  Substance and Sexual Activity   Alcohol use: No    Alcohol/week: 0.0 standard drinks of alcohol   Drug use: No   Sexual activity: Not Currently  Other Topics Concern   Not on file  Social History Narrative   Widowed- was married for 77 years    Has two sons       Social Determinants of Health   Financial Resource Strain: Low Risk  (08/14/2022)   Overall Financial Resource Strain (CARDIA)    Difficulty of Paying Living Expenses: Not hard at all  Food Insecurity: No Food Insecurity (08/14/2022)   Hunger Vital Sign    Worried About Running Out of Food in the Last Year: Never true    Ran Out of Food in the Last Year: Never true  Transportation Needs: No Transportation Needs (08/14/2022)  PRAPARE - Hydrologist (Medical): No    Lack of Transportation (Non-Medical): No  Physical Activity: Unknown (08/14/2022)   Exercise Vital Sign    Days of Exercise per Week: 0 days    Minutes of Exercise per Session: Not on file  Stress: No Stress Concern Present (08/14/2022)   [place]    Feeling of Stress : Only a little  Social Connections: Socially Isolated (08/14/2022)   Social Connection and Isolation Panel [NHANES]    Frequency of Communication with Friends and Family: More than three times a week    Frequency of Social Gatherings with Friends and Family: Three times a week    Attends Religious Services: Never    Active Member of Clubs or Organizations: No    Attends Archivist Meetings: Not  on file    Marital Status: Widowed  Intimate Partner Violence: Not At Risk (03/17/2021)   Humiliation, Afraid, Rape, and Kick questionnaire    Fear of Current or Ex-Partner: No    Emotionally Abused: No    Physically Abused: No    Sexually Abused: No    Past Surgical History:  Procedure Laterality Date   ABDOMINAL HYSTERECTOMY     APPENDECTOMY     BACK SURGERY     CATARACT EXTRACTION, BILATERAL     CHOLECYSTECTOMY     COLONOSCOPY WITH PROPOFOL N/A 04/16/2021   Procedure: COLONOSCOPY WITH PROPOFOL;  Surgeon: Milus Banister, MD;  Location: [facility];  Service: Gastroenterology;  Laterality: N/A;   ESOPHAGOGASTRODUODENOSCOPY (EGD) WITH PROPOFOL N/A 04/15/2021   Procedure: ESOPHAGOGASTRODUODENOSCOPY (EGD) WITH PROPOFOL;  Surgeon: Doran Stabler, MD;  Location: [facility];  Service: Gastroenterology;  Laterality: N/A;    Family History  Problem Relation Age of Onset   Diabetes Mother    Stroke Brother    Cerebral palsy Brother    Coronary artery disease Neg Hx     Allergies  Allergen Reactions   Amlodipine Besy-Benazepril Hcl Cough   Hctz [Hydrochlorothiazide] Other (See Comments)    Per family, every diuretic the patient has been on flushes out her out and ultimately results in CRAMPING/JAUNDICE (added potassium might help)   Other Other (See Comments)    History of diverticulitis- CANNOT TOLERATE NUTS, CORN, AND ANY FOODS NOT EASILY DIGESTED- the patient AVOIDS these   Cyclobenzaprine Other (See Comments)    Confusion- does not tolerate well    Diclofenac Sodium Diarrhea   Hydralazine Other (See Comments)    Per family, every diuretic the patient has been on flushes out her out and ultimately results in CRAMPING/JAUNDICE (added potassium might help)   Methylprednisolone Other (See Comments)    Medrol/CORTICOSTEROIDS = A LOT OF ENERGY   Oxycodone Other (See Comments)    Patient becomes very disorientated     Current Outpatient Medications on File Prior to Visit   Medication Sig Dispense Refill   acetaminophen (TYLENOL) 500 MG tablet Take 500 mg by mouth 3 (three) times daily as needed for moderate pain. Take with Tramadol     apixaban (ELIQUIS) 5 MG TABS tablet Take 1 tablet (5 mg total) by mouth 2 (two) times daily. 180 tablet 1   atorvastatin (LIPITOR) 40 MG tablet TAKE 1 TABLET BY MOUTH DAILY AT 6 PM. 90 tablet 1   cephALEXin (KEFLEX) 250 MG capsule Take 1 capsule (250 mg total) by mouth daily. 90 capsule 1   cholecalciferol (VITAMIN D3) 25 MCG (1000 UNIT)  tablet Take 1,000 Units by mouth 2 (two) times daily.     Coenzyme Q10 (CO Q-10) 300 MG CAPS Take 300 mg by mouth in the morning.     Ferrous Gluconate 324 (37.5 Fe) MG TABS Take 1 tablet (324 mg total) by mouth daily with breakfast. (Patient taking differently: Take 324 mg by mouth daily with breakfast. Every other day) 90 tablet 3   Lidocaine-Menthol, Spray, 4-1 % LIQD Apply 1 spray topically daily as needed (back/knee pain/restless legs).     Magnesium 200 MG TABS Take 200 mg by mouth every evening.     metoprolol succinate (TOPROL-XL) 50 MG 24 hr tablet TAKE 1 TABLET (50 MG TOTAL) BY MOUTH TWICE A DAY .TAKE WITH OR IMMEDIATELY FOLLOWING A MEAL 180 tablet 1   Multiple Vitamin (MULTIVITAMIN WITH MINERALS) TABS tablet Take 1 tablet by mouth 2 (two) times daily. Centrum Minis     potassium chloride 20 MEQ/15ML (10%) SOLN Take 15 mLs (20 mEq total) by mouth daily. (Patient taking differently: Take 20 mEq by mouth daily. 7 Mls daily) 1350 mL 3   psyllium (METAMUCIL) 58.6 % powder Take 1 packet by mouth daily as needed (for constipation).     spironolactone (ALDACTONE) 25 MG tablet TAKE 1/2 TABLET BY MOUTH DAILY 45 tablet 1   terazosin (HYTRIN) 10 MG capsule TAKE 1 CAPSULE BY MOUTH AT BEDTIME. 90 capsule 1   torsemide (DEMADEX) 20 MG tablet TAKE 2 TABLETS (40 MG TOTAL) BY MOUTH DAILY. 180 tablet 1   vitamin B-12 (CYANOCOBALAMIN) 1000 MCG tablet Take 1 tablet (1,000 mcg total) by mouth daily. 30 tablet 3    No current facility-administered medications on file prior to visit.    BP (!) 120/58   Pulse 94   Temp (!) 97.5 F (36.4 C) (Oral)   Ht 5' (1.524 m)   Wt 143 lb (64.9 kg)   SpO2 98%   BMI 27.93 kg/m       Objective:   Physical Exam Vitals and nursing note reviewed.  Constitutional:      Appearance: Normal appearance.  Musculoskeletal:        General: Tenderness present. No swelling or deformity.     Right knee: Bony tenderness present. No swelling or deformity. Decreased range of motion. No tenderness. Normal alignment, normal meniscus and normal patellar mobility.  Skin:    General: Skin is warm and dry.     Capillary Refill: Capillary refill takes less than 2 seconds.  Neurological:     General: No focal deficit present.     Mental Status: She is alert and oriented to person, place, and time.  Psychiatric:        Mood and Affect: Mood normal.        Behavior: Behavior normal.        Thought Content: Thought content normal.        Judgment: Judgment normal.        Assessment & Plan:  1. Acute pain of right knee - Continue with current plan of care as she has been doing any home - Follow up with orthopedics as directed  [name], NP

## 2022-08-18 ENCOUNTER — Telehealth: Payer: Self-pay

## 2022-08-18 NOTE — Telephone Encounter (Signed)
        Patient  visited [facility] on 12/26  Telephone encounter attempt :  1st  A HIPAA compliant voice message was left requesting a return call.  Instructed patient to call back     [place], [facility]  [phone] [address], [place], [place] [address] Phone: [phone] Email: [email or web address]

## 2022-08-19 ENCOUNTER — Telehealth: Payer: Self-pay

## 2022-08-19 NOTE — Telephone Encounter (Signed)
     Patient  visit on 12/25  at [facility]   Have you been able to follow up with your primary care physician? Yes   The patient was or was not able to obtain any needed medicine or equipment. Yes   Are there diet recommendations that you are having difficulty following? Na   Patient expresses understanding of discharge instructions and education provided has no other needs at this time.  Yes      [place], [facility], Care Management  [phone] [address], [place], [place] [address] Phone: [phone] Email: [email or web address]

## 2022-08-26 ENCOUNTER — Encounter: Payer: Medicare Other | Admitting: Family Medicine

## 2022-08-30 ENCOUNTER — Encounter: Payer: Self-pay | Admitting: Family Medicine

## 2022-08-30 ENCOUNTER — Non-Acute Institutional Stay: Payer: Medicare Other | Admitting: Family Medicine

## 2022-08-30 VITALS — BP 98/50 | HR 55 | Temp 97.8°F | Resp 18

## 2022-08-30 DIAGNOSIS — M25561 Pain in right knee: Secondary | ICD-10-CM

## 2022-08-30 NOTE — Progress Notes (Signed)
Therapist, nutritional Palliative Care Consult Note Telephone: [phone]  Fax: [phone]    Date of encounter: 08/30/22 11:39 AM PATIENT NAME: Tara Fields [address] [place] [address]   [phone] (home)  DOB: [birth year] MRN: [id number] PRIMARY CARE PROVIDER:    Shirline Frees, NP,  [address] [place] [address] [phone]  REFERRING PROVIDER:   Shirline Frees, NP [address] [place],  [place] [address] [phone]  RESPONSIBLE PARTY:    Contact Information     Name Relation Home Work Hubbard Son [phone]  [phone]        I met face to face with patient and son Tara Fields in her AL facility. Palliative Care was asked to follow this patient by consultation request of  Nafziger, Kandee Keen, NP to address advance care planning and complex medical decision making. This is a follow up visit    ASSESSMENT , SYMPTOM MANAGEMENT AND PLAN / RECOMMENDATIONS:   Acute right knee pain Encouraged use of stabilizing brace during the day  Continue Tylenol and Tramadol as prescribed Encourage elevation and icing.      Advance Care Planning/Goals of Care: Goals include to maximize quality of life and symptom management.  Health Care Agent is son Tara Fields Decision not to resuscitate or to de-escalate disease focused treatments due to poor prognosis. CODE STATUS: MOST as of 12/30/21: DNR/DNI with limited additional intervention Use of antibiotics if indicated Use of IV fluids and feeding tube on a case by case, time limited basis .      Follow up Palliative Care Visit: Palliative care will continue to follow for complex medical decision making, advance care planning, and clarification of goals. Return 4  weeks or prn.    This visit was coded based on medical decision making (MDM).  PPS: 50%  HOSPICE ELIGIBILITY/DIAGNOSIS: TBD  Chief Complaint:  Palliative Care is following  for chronic medical management in setting of chronic congestive heart failure and PAF.   HISTORY OF PRESENT ILLNESS:  Tara Fields is a 89 y.o. year old female with chronic combined systolic and diastolic heart failure, COPD, CAD, aortic stenosis status post TAVR, bilateral carotid artery stenosis, hypertension, persistent atrial fibrillation, GERD, history of GI bleed, diabetes, hyperlipidemia, OA of multiple joints, CKD, diverticulitis, vitamin D deficiency, cognitive deficit, anemia, leg cramps, malnutrition, pain of upper abdomen. Has chronic bilateral knee pain. She had a recent fall over Christmas where her right knee "gave out" on her and started swelling being painful.  States her appetite is fair and unchanged.  She denies any weight loss.  Denies chest pain, SOB, dysuria, constipation.  She states recent episode of diarrhea. She has had difficulty getting her knee brace on which helps with pain.  History obtained from review of EMR, discussion with  Tara Fields.   Xray right knee 08/09/22: FINDINGS: Demineralization. No definite acute fracture. Tricompartmental hypertrophic degenerative arthritis in the right knee with advanced narrowing of all 3 compartments. Moderate knee joint effusion.   IMPRESSION: No definite acute fracture. Advanced osteoarthritis right knee. Moderate knee joint effusion.  Lumbar spine xray 08/09/22: FINDINGS: Lumbar spine, routine five views:   There is diffuse osteopenia. There is preservation of the normal lumbar vertebral heights, without appreciable fractures.   Again noted is grade 1 discogenic anterolisthesis L3-4, grade 1 discogenic retrolisthesis L2-3, and a slight dextroscoliosis.   There is no new or further alignment abnormality. There are posterior fusion plates with bilateral pedicle screws L4-S1,  with spinous process shaving and bone grafting and with solid fusions.   There is disc collapse with bulky marginal osteophytes L1-2  through L3-4, and bulky marginal osteophytes with bridging in the lower thoracic spine.   Moderate facet hypertrophy seen at L1-2 through L3-4, where there is bilateral foraminal stenosis.   The SI joints are patent with bilateral spurring and vacuum phenomenon   The aorta and common iliac arteries are heavily calcified. Cholecystectomy clips are again shown.   AP pelvis and separate AP and frog-leg right hip:   Diffuse osteopenia. No fracture seen involving the pelvis and proximal femurs.   There is mild to moderate symmetric degenerative arthrosis at the hips with enthesopathic changes of the bony pelvis and greater trochanters.   Both femoral arteries are heavily calcified. There is artifact from overlying clothing.   IMPRESSION: 1. Diffuse osteopenia, degenerative changes and postsurgical changes without evidence of lumbar spine fractures. 2. No pelvic or proximal femur fracture. 3. Mild to moderate symmetric degenerative arthrosis of the hips. 4. Enthesopathic changes of the bony pelvis and greater trochanters. 5. Heavy aortoiliac atherosclerosis. 6. Extensive atherosclerosis of the femoral arteries.  I reviewed EMR for available labs, medications, imaging, studies and related documents.    ROS General: NAD Cardiovascular: denies chest pain, denies DOE Pulmonary: denies cough, denies increased SOB Abdomen: endorses poor appetite, endorses recent diarrhea, endorses continence of bowel GU: denies dysuria, endorses some urge incontinence of urine MSK:  denies increased weakness, right knee pain and single fall reported Skin: No rashes Neurological: endorses pain and same nausea Psych: mood better Heme/lymph/immuno: denies bruises, abnormal bleeding  Physical Exam: Current and past weights:  135 lbs last visit Constitutional: NAD General: WNWD CV: S1S2, IRIR with no MRG, no LE edema Pulmonary: CTAB, no SOB, no cough, room air Abdomen: no distension, normo-active BS + 4  quadrants, soft and non tender GU: deferred MSK: no sarcopenia, moves all extremities with some mild edema of right knee without erythema or heat, ambulatory with rollator Skin: warm and dry Neuro:  no generalized weakness, cognitive impairment noted Psych: alert and oriented x 3 Hem/lymph/immuno: no widespread bruising   Thank you for the opportunity to participate in the care of Ms. Bozzi.  The palliative care team will continue to follow. Please call our office at [phone] if we can be of additional assistance.   Lurline Idol, FNP -C  COVID-19 PATIENT SCREENING TOOL Asked and negative response unless otherwise noted:   Have you had symptoms of covid, tested positive or been in contact with someone with symptoms/positive test in the past 5-10 days?  No

## 2022-09-01 ENCOUNTER — Other Ambulatory Visit: Payer: Self-pay | Admitting: Adult Health

## 2022-09-07 ENCOUNTER — Other Ambulatory Visit: Payer: Self-pay

## 2022-09-07 ENCOUNTER — Observation Stay (HOSPITAL_COMMUNITY)
Admission: EM | Admit: 2022-09-07 | Discharge: 2022-09-09 | Disposition: A | Discharge status: Home / Self Care | Payer: Medicare Other | Attending: Internal Medicine | Admitting: Internal Medicine

## 2022-09-07 ENCOUNTER — Encounter (HOSPITAL_COMMUNITY): Payer: Self-pay | Admitting: Emergency Medicine

## 2022-09-07 ENCOUNTER — Emergency Department (HOSPITAL_COMMUNITY): Payer: Medicare Other

## 2022-09-07 DIAGNOSIS — I130 Hypertensive heart and chronic kidney disease with heart failure and stage 1 through stage 4 chronic kidney disease, or unspecified chronic kidney disease: Secondary | ICD-10-CM | POA: Insufficient documentation

## 2022-09-07 DIAGNOSIS — I10 Essential (primary) hypertension: Secondary | ICD-10-CM

## 2022-09-07 DIAGNOSIS — I509 Heart failure, unspecified: Secondary | ICD-10-CM

## 2022-09-07 DIAGNOSIS — I480 Paroxysmal atrial fibrillation: Secondary | ICD-10-CM | POA: Diagnosis present

## 2022-09-07 DIAGNOSIS — Z79899 Other long term (current) drug therapy: Secondary | ICD-10-CM | POA: Diagnosis not present

## 2022-09-07 DIAGNOSIS — J449 Chronic obstructive pulmonary disease, unspecified: Secondary | ICD-10-CM | POA: Diagnosis not present

## 2022-09-07 DIAGNOSIS — Z87891 Personal history of nicotine dependence: Secondary | ICD-10-CM | POA: Diagnosis not present

## 2022-09-07 DIAGNOSIS — Z20822 Contact with and (suspected) exposure to covid-19: Secondary | ICD-10-CM | POA: Diagnosis not present

## 2022-09-07 DIAGNOSIS — R0600 Dyspnea, unspecified: Secondary | ICD-10-CM | POA: Diagnosis present

## 2022-09-07 DIAGNOSIS — I2510 Atherosclerotic heart disease of native coronary artery without angina pectoris: Secondary | ICD-10-CM | POA: Insufficient documentation

## 2022-09-07 DIAGNOSIS — I5023 Acute on chronic systolic (congestive) heart failure: Secondary | ICD-10-CM

## 2022-09-07 DIAGNOSIS — E538 Deficiency of other specified B group vitamins: Secondary | ICD-10-CM | POA: Diagnosis present

## 2022-09-07 DIAGNOSIS — J9601 Acute respiratory failure with hypoxia: Secondary | ICD-10-CM | POA: Insufficient documentation

## 2022-09-07 DIAGNOSIS — E785 Hyperlipidemia, unspecified: Secondary | ICD-10-CM | POA: Diagnosis not present

## 2022-09-07 DIAGNOSIS — E1122 Type 2 diabetes mellitus with diabetic chronic kidney disease: Secondary | ICD-10-CM | POA: Diagnosis not present

## 2022-09-07 DIAGNOSIS — N1832 Chronic kidney disease, stage 3b: Secondary | ICD-10-CM | POA: Diagnosis not present

## 2022-09-07 DIAGNOSIS — I5043 Acute on chronic combined systolic (congestive) and diastolic (congestive) heart failure: Secondary | ICD-10-CM

## 2022-09-07 HISTORY — DX: Acute on chronic systolic (congestive) heart failure: I50.23

## 2022-09-07 LAB — CBC
HCT: 40.4 % (ref 36.0–46.0)
Hemoglobin: 13.1 g/dL (ref 12.0–15.0)
MCH: 31.1 pg (ref 26.0–34.0)
MCHC: 32.4 g/dL (ref 30.0–36.0)
MCV: 96 fL (ref 80.0–100.0)
Platelets: 193 10*3/uL (ref 150–400)
RBC: 4.21 MIL/uL (ref 3.87–5.11)
RDW: 16.1 % — ABNORMAL HIGH (ref 11.5–15.5)
WBC: 7.2 10*3/uL (ref 4.0–10.5)
nRBC: 0 % (ref 0.0–0.2)

## 2022-09-07 LAB — COMPREHENSIVE METABOLIC PANEL
ALT: 17 U/L (ref 0–44)
AST: 32 U/L (ref 15–41)
Albumin: 3.8 g/dL (ref 3.5–5.0)
Alkaline Phosphatase: 86 U/L (ref 38–126)
Anion gap: 15 (ref 5–15)
BUN: 62 mg/dL — ABNORMAL HIGH (ref 8–23)
CO2: 19 mmol/L — ABNORMAL LOW (ref 22–32)
Calcium: 9.2 mg/dL (ref 8.9–10.3)
Chloride: 102 mmol/L (ref 98–111)
Creatinine, Ser: 1.45 mg/dL — ABNORMAL HIGH (ref 0.44–1.00)
GFR, Estimated: 34 mL/min — ABNORMAL LOW (ref >60)
Glucose, Bld: 142 mg/dL — ABNORMAL HIGH (ref 70–99)
Potassium: 4.8 mmol/L (ref 3.5–5.1)
Sodium: 136 mmol/L (ref 135–145)
Total Bilirubin: 0.7 mg/dL (ref 0.3–1.2)
Total Protein: 6.7 g/dL (ref 6.5–8.1)

## 2022-09-07 LAB — URINALYSIS, ROUTINE W REFLEX MICROSCOPIC
Bilirubin Urine: NEGATIVE
Glucose, UA: NEGATIVE mg/dL
Hgb urine dipstick: NEGATIVE
Ketones, ur: NEGATIVE mg/dL
Leukocytes,Ua: NEGATIVE
Nitrite: NEGATIVE
Protein, ur: NEGATIVE mg/dL
Specific Gravity, Urine: 1.011 (ref 1.005–1.030)
pH: 7 (ref 5.0–8.0)

## 2022-09-07 LAB — BRAIN NATRIURETIC PEPTIDE: B Natriuretic Peptide: 1839.2 pg/mL — ABNORMAL HIGH (ref 0.0–100.0)

## 2022-09-07 LAB — RESP PANEL BY RT-PCR (RSV, FLU A&B, COVID)  RVPGX2
Influenza A by PCR: NEGATIVE
Influenza B by PCR: NEGATIVE
Resp Syncytial Virus by PCR: NEGATIVE
SARS Coronavirus 2 by RT PCR: NEGATIVE

## 2022-09-07 LAB — TROPONIN I (HIGH SENSITIVITY)
Troponin I (High Sensitivity): 14 ng/L (ref <18)
Troponin I (High Sensitivity): 15 ng/L (ref <18)
Troponin I (High Sensitivity): 19 ng/L — ABNORMAL HIGH (ref <18)

## 2022-09-07 MED ORDER — FUROSEMIDE 10 MG/ML IJ SOLN
40.0000 mg | Freq: Two times a day (BID) | INTRAMUSCULAR | Status: DC
  Administered 2022-09-08: 40 mg via INTRAVENOUS
  Filled 2022-09-07: qty 4

## 2022-09-07 MED ORDER — MAGNESIUM OXIDE -MG SUPPLEMENT 400 (240 MG) MG PO TABS
200.0000 mg | ORAL_TABLET | Freq: Every evening | ORAL | Status: DC
  Administered 2022-09-07 – 2022-09-08 (×2): 200 mg via ORAL
  Filled 2022-09-07 (×2): qty 1

## 2022-09-07 MED ORDER — ACETAMINOPHEN 325 MG PO TABS
650.0000 mg | ORAL_TABLET | Freq: Four times a day (QID) | ORAL | Status: DC | PRN
  Administered 2022-09-08 – 2022-09-09 (×2): 650 mg via ORAL
  Filled 2022-09-07 (×2): qty 2

## 2022-09-07 MED ORDER — VITAMIN D 25 MCG (1000 UNIT) PO TABS
1000.0000 [IU] | ORAL_TABLET | Freq: Two times a day (BID) | ORAL | Status: DC
  Administered 2022-09-07 – 2022-09-09 (×4): 1000 [IU] via ORAL
  Filled 2022-09-07 (×4): qty 1

## 2022-09-07 MED ORDER — MAGNESIUM 200 MG PO TABS: 200.0000 mg | ORAL_TABLET | Freq: Every evening | ORAL | Status: DC

## 2022-09-07 MED ORDER — ADULT MULTIVITAMIN W/MINERALS CH
1.0000 | ORAL_TABLET | Freq: Two times a day (BID) | ORAL | Status: DC
  Administered 2022-09-07 – 2022-09-09 (×4): 1 via ORAL
  Filled 2022-09-07 (×4): qty 1

## 2022-09-07 MED ORDER — FUROSEMIDE 10 MG/ML IJ SOLN
60.0000 mg | Freq: Once | INTRAMUSCULAR | Status: AC
  Administered 2022-09-07: 60 mg via INTRAVENOUS
  Filled 2022-09-07: qty 6

## 2022-09-07 MED ORDER — VITAMIN B-12 1000 MCG PO TABS
1000.0000 ug | ORAL_TABLET | Freq: Every day | ORAL | Status: DC
  Administered 2022-09-08 – 2022-09-09 (×2): 1000 ug via ORAL
  Filled 2022-09-07 (×2): qty 1

## 2022-09-07 MED ORDER — PSYLLIUM 95 % PO PACK
1.0000 | PACK | Freq: Every day | ORAL | Status: DC | PRN
  Filled 2022-09-07: qty 1

## 2022-09-07 MED ORDER — TRAZODONE HCL 50 MG PO TABS
25.0000 mg | ORAL_TABLET | Freq: Every evening | ORAL | Status: DC | PRN
  Administered 2022-09-07 – 2022-09-08 (×2): 25 mg via ORAL
  Filled 2022-09-07 (×2): qty 1

## 2022-09-07 MED ORDER — ATORVASTATIN CALCIUM 40 MG PO TABS
40.0000 mg | ORAL_TABLET | Freq: Every day | ORAL | Status: DC
  Administered 2022-09-07 – 2022-09-08 (×2): 40 mg via ORAL
  Filled 2022-09-07 (×2): qty 1

## 2022-09-07 MED ORDER — CO Q-10 300 MG PO CAPS: 300.0000 mg | ORAL_CAPSULE | Freq: Every morning | ORAL | Status: DC

## 2022-09-07 MED ORDER — METOPROLOL SUCCINATE ER 25 MG PO TB24
50.0000 mg | ORAL_TABLET | Freq: Every day | ORAL | Status: DC
  Administered 2022-09-08: 50 mg via ORAL
  Filled 2022-09-07: qty 2

## 2022-09-07 MED ORDER — SPIRONOLACTONE 12.5 MG HALF TABLET
12.5000 mg | ORAL_TABLET | Freq: Every day | ORAL | Status: DC
  Administered 2022-09-08: 12.5 mg via ORAL
  Filled 2022-09-07: qty 1

## 2022-09-07 MED ORDER — ONDANSETRON HCL 4 MG PO TABS: 4.0000 mg | ORAL_TABLET | Freq: Four times a day (QID) | ORAL | Status: DC | PRN

## 2022-09-07 MED ORDER — INSULIN ASPART 100 UNIT/ML IJ SOLN
0.0000 [IU] | Freq: Three times a day (TID) | INTRAMUSCULAR | Status: DC
  Administered 2022-09-08 (×2): 2 [IU] via SUBCUTANEOUS

## 2022-09-07 MED ORDER — MAGNESIUM HYDROXIDE 400 MG/5ML PO SUSP
30.0000 mL | Freq: Every day | ORAL | Status: DC | PRN
  Administered 2022-09-08: 30 mL via ORAL
  Filled 2022-09-07: qty 30

## 2022-09-07 MED ORDER — ONDANSETRON HCL 4 MG/2ML IJ SOLN: 4.0000 mg | Freq: Four times a day (QID) | INTRAMUSCULAR | Status: DC | PRN

## 2022-09-07 MED ORDER — TERAZOSIN HCL 5 MG PO CAPS
10.0000 mg | ORAL_CAPSULE | Freq: Every day | ORAL | Status: DC
Start: 2022-09-07 — End: 2022-09-09
  Administered 2022-09-08 (×2): 10 mg via ORAL
  Filled 2022-09-07 (×4): qty 2

## 2022-09-07 MED ORDER — ACETAMINOPHEN 650 MG RE SUPP: 650.0000 mg | Freq: Four times a day (QID) | RECTAL | Status: DC | PRN

## 2022-09-07 MED ORDER — APIXABAN 5 MG PO TABS
5.0000 mg | ORAL_TABLET | Freq: Two times a day (BID) | ORAL | Status: DC
  Administered 2022-09-07 – 2022-09-09 (×4): 5 mg via ORAL
  Filled 2022-09-07 (×4): qty 1

## 2022-09-07 MED ORDER — FERROUS GLUCONATE 324 (38 FE) MG PO TABS
324.0000 mg | ORAL_TABLET | Freq: Every day | ORAL | Status: DC
  Administered 2022-09-08 – 2022-09-09 (×2): 324 mg via ORAL
  Filled 2022-09-07 (×3): qty 1

## 2022-09-07 NOTE — H&P (Addendum)
[place]   PATIENT NAME: Tara Fields    MR#:  [id number]  DATE OF BIRTH:  [birth year]  DATE OF ADMISSION:  09/07/2022  PRIMARY CARE PHYSICIAN: Shirline Frees, NP   Patient is coming from: Home  REQUESTING/REFERRING PHYSICIAN: Margarita Grizzle, MD  CHIEF COMPLAINT:   Chief Complaint  Patient presents with   Shortness of Breath   Near Syncope    HISTORY OF PRESENT ILLNESS:  Tara Fields is a 89 y.o. Caucasian female with medical history significant for coronary artery disease, COPD, type 2 diabetes mellitus, GERD, hypertension, dyslipidemia and tobacco abuse, who presented to the emergency room with acute onset of substernal chest pressure with associated generalized weakness and dyspnea.  She has been having diminished activity and fatigue since she hyperextended her knee on Christmas Eve.  Since this she has been getting slightly better and has been more mobile.  She went to an exercise session today and upon return had worsening dyspnea and weakness.  She usually uses oxygen only as needed at home.  She desaturated today and the pulse oximetry was up to 98% to 4 L as pulse oximetry today no fever chills.  She has been having dyspnea on exertion.  She has orthopnea from back pain.  She denied any lower extremity edema.  No cough or wheezing.  No nausea or vomiting or abdominal pain or diarrhea or melena or bright red bleeding per rectum.  No dysuria, oliguria, hematuria, urgency or frequency or flank pain.  ED Course: Upon presentation to the emergency room, vital signs were within normal and pulse oximetry was 98% on 4 L of O2 by nasal cannula and later 100% on 3 L of O2.  Respiratory rate later was 30 and heart rate was 117 and later 82.  CMP revealed CO2 19 and blood glucose was 142, BUN 62 creatinine 1.45.  BNP was 1839 and high-sensitivity troponin I was 14 and later 15.  CBC was within normal. EKG as reviewed by me : EKG showed atrial fibrillation with a rate of 100  with nonspecific intraventricular conduction delay and ST segment depression with T wave inversion inferiorly and laterally. Imaging: Portable chest x-ray showed post transarterial aortic valve replacement, marked cardiomegaly as on previous imaging.  The patient was given 60 mg of IV Lasix.  She will be admitted to a cardiac telemetry bed for further evaluation and management. PAST MEDICAL HISTORY:   Past Medical History:  Diagnosis Date   Acute exacerbation of CHF (congestive heart failure) (HCC) 12/18/2020   AKI (acute kidney injury) (HCC)    Aortic stenosis    s/p TAVR 26 mm Edwards Sapien 3 THV July 2020   CAD (coronary artery disease)    COPD (chronic obstructive pulmonary disease) (HCC)    Diabetes mellitus (HCC)    Diverticulitis    GERD (gastroesophageal reflux disease)    HTN (hypertension)    Hyperlipidemia    Legally blind in left eye, as defined in [place]    Tobacco abuse    UTI (urinary tract infection) 11/10/2021    PAST SURGICAL HISTORY:   Past Surgical History:  Procedure Laterality Date   ABDOMINAL HYSTERECTOMY     APPENDECTOMY     BACK SURGERY     CATARACT EXTRACTION, BILATERAL     CHOLECYSTECTOMY     COLONOSCOPY WITH PROPOFOL N/A 04/16/2021   Procedure: COLONOSCOPY WITH PROPOFOL;  Surgeon: Rachael Fee, MD;  Location: [facility];  Service: Gastroenterology;  Laterality: N/A;  ESOPHAGOGASTRODUODENOSCOPY (EGD) WITH PROPOFOL N/A 04/15/2021   Procedure: ESOPHAGOGASTRODUODENOSCOPY (EGD) WITH PROPOFOL;  Surgeon: Sherrilyn Rist, MD;  Location: [facility];  Service: Gastroenterology;  Laterality: N/A;    SOCIAL HISTORY:   Social History   Tobacco Use   Smoking status: Former    Packs/day: 1.00    Years: 70.00    Total pack years: 70.00    Types: Cigarettes    Quit date: 12/15/2019    Years since quitting: 2.7   Smokeless tobacco: Never   Tobacco comments:    Pt states quiting a year ago. 01/03/20 [facility]  Substance Use Topics   Alcohol use: No     Alcohol/week: 0.0 standard drinks of alcohol    FAMILY HISTORY:   Family History  Problem Relation Age of Onset   Diabetes Mother    Stroke Brother    Cerebral palsy Brother    Coronary artery disease Neg Hx     DRUG ALLERGIES:   Allergies  Allergen Reactions   Amlodipine Besy-Benazepril Hcl Cough   Hctz [Hydrochlorothiazide] Other (See Comments)    Per family, every diuretic the patient has been on flushes out her out and ultimately results in CRAMPING/JAUNDICE (added potassium might help)   Other Other (See Comments)    History of diverticulitis- CANNOT TOLERATE NUTS, CORN, AND ANY FOODS NOT EASILY DIGESTED- the patient AVOIDS these   Cyclobenzaprine Other (See Comments)    Confusion- does not tolerate well    Diclofenac Sodium Diarrhea   Hydralazine Other (See Comments)    Per family, every diuretic the patient has been on flushes out her out and ultimately results in CRAMPING/JAUNDICE (added potassium might help)   Methylprednisolone Other (See Comments)    Medrol/CORTICOSTEROIDS = A LOT OF ENERGY   Oxycodone Other (See Comments)    Patient becomes very disorientated     REVIEW OF SYSTEMS:   ROS As per history of present illness. All pertinent systems were reviewed above. Constitutional, HEENT, cardiovascular, respiratory, GI, GU, musculoskeletal, neuro, psychiatric, endocrine, integumentary and hematologic systems were reviewed and are otherwise negative/unremarkable except for positive findings mentioned above in the HPI.   MEDICATIONS AT HOME:   Prior to Admission medications   Medication Sig Start Date End Date Taking? Authorizing Provider  acetaminophen (TYLENOL) 500 MG tablet Take 500 mg by mouth 3 (three) times daily as needed for moderate pain. Take with Tramadol    [provider]  atorvastatin (LIPITOR) 40 MG tablet TAKE 1 TABLET BY MOUTH DAILY AT 6 PM. 06/18/22   Nafziger, Kandee Keen, NP  cephALEXin (KEFLEX) 250 MG capsule Take 1 capsule (250 mg total) by  mouth daily. 06/24/22   Nafziger, Kandee Keen, NP  cholecalciferol (VITAMIN D3) 25 MCG (1000 UNIT) tablet Take 1,000 Units by mouth 2 (two) times daily.    [provider]  Coenzyme Q10 (CO Q-10) 300 MG CAPS Take 300 mg by mouth in the morning.    [provider]  ELIQUIS 5 MG TABS tablet TAKE 1 TABLET BY MOUTH TWICE A DAY 09/01/22   Nafziger, Kandee Keen, NP  Ferrous Gluconate 324 (37.5 Fe) MG TABS Take 1 tablet (324 mg total) by mouth daily with breakfast. Patient taking differently: Take 324 mg by mouth daily with breakfast. Every other day 09/09/21   Nafziger, Kandee Keen, NP  Lidocaine-Menthol, Spray, 4-1 % LIQD Apply 1 spray topically daily as needed (back/knee pain/restless legs).    [provider]  Magnesium 200 MG TABS Take 200 mg by mouth every evening.  [provider]  metoprolol succinate (TOPROL-XL) 50 MG 24 hr tablet TAKE 1 TABLET (50 MG TOTAL) BY MOUTH TWICE A DAY .TAKE WITH OR IMMEDIATELY FOLLOWING A MEAL 05/11/22   Nafziger, Tommi Rumps, NP  Multiple Vitamin (MULTIVITAMIN WITH MINERALS) TABS tablet Take 1 tablet by mouth 2 (two) times daily. Centrum Minis    [provider]  potassium chloride 20 MEQ/15ML (10%) SOLN Take 15 mLs (20 mEq total) by mouth daily. Patient taking differently: Take 20 mEq by mouth daily. 7 Mls daily 05/19/21 08/17/22  [name], PA-C  psyllium (METAMUCIL) 58.6 % powder Take 1 packet by mouth daily as needed (for constipation).    [provider]  spironolactone (ALDACTONE) 25 MG tablet TAKE 1/2 TABLET BY MOUTH DAILY 05/11/22   Nafziger, Tommi Rumps, NP  terazosin (HYTRIN) 10 MG capsule TAKE 1 CAPSULE BY MOUTH AT BEDTIME. 05/11/22   Nafziger, Tommi Rumps, NP  torsemide (DEMADEX) 20 MG tablet TAKE 2 TABLETS (40 MG TOTAL) BY MOUTH DAILY. 05/11/22   Nafziger, Tommi Rumps, NP  vitamin B-12 (CYANOCOBALAMIN) 1000 MCG tablet Take 1 tablet (1,000 mcg total) by mouth daily. 04/16/21   Oswald Hillock, MD      VITAL SIGNS:  Blood pressure 116/87, pulse 87,  temperature 97.8 F (36.6 C), resp. rate 20, height 5' (1.524 m), weight 63.5 kg, SpO2 100 %.  PHYSICAL EXAMINATION:  Physical Exam  GENERAL:  89 y.o.-year-old Caucasian female patient lying in the bed with no acute distress.  EYES: Pupils equal, round, reactive to light and accommodation. No scleral icterus. Extraocular muscles intact.  HEENT: Head atraumatic, normocephalic. Oropharynx and nasopharynx clear.  NECK:  Supple, no jugular venous distention. No thyroid enlargement, no tenderness.  LUNGS: Slightly diminished bibasilar breath sounds. No use of accessory muscles of respiration.  CARDIOVASCULAR: Regular rate and rhythm, S1, S2 normal. No murmurs, rubs, or gallops.  ABDOMEN: Soft, nondistended, nontender. Bowel sounds present. No organomegaly or mass.  EXTREMITIES: No pedal edema, cyanosis, or clubbing.  NEUROLOGIC: Cranial nerves II through XII are intact. Muscle strength 5/5 in all extremities. Sensation intact. Gait not checked.  PSYCHIATRIC: The patient is alert and oriented x 3.  Normal affect and good eye contact. SKIN: No obvious rash, lesion, or ulcer.   LABORATORY PANEL:   CBC Recent Labs  Lab 09/07/22 1743  WBC 7.2  HGB 13.1  HCT 40.4  PLT 193   ------------------------------------------------------------------------------------------------------------------  Chemistries  Recent Labs  Lab 09/07/22 1743  NA 136  K 4.8  CL 102  CO2 19*  GLUCOSE 142*  BUN 62*  CREATININE 1.45*  CALCIUM 9.2  AST 32  ALT 17  ALKPHOS 86  BILITOT 0.7   ------------------------------------------------------------------------------------------------------------------  Cardiac Enzymes No results for input(s): "TROPONINI" in the last 168 hours. ------------------------------------------------------------------------------------------------------------------  RADIOLOGY:  DG Chest Port 1 View  Result Date: 09/07/2022 CLINICAL DATA:  89 year old female presenting with  dyspnea. EXAM: PORTABLE CHEST 1 VIEW COMPARISON:  April 23, 2022. FINDINGS: EKG leads project over the chest. Marked cardiomegaly as on previous imaging. Post trans arterial aortic valve replacement as on previous imaging. Aortic atherosclerosis. Hilar structures are stable. No lobar consolidation.  No gross pulmonary edema or pneumothorax. On limited assessment no acute skeletal process. IMPRESSION: 1. Marked cardiomegaly as on previous imaging. 2. Post trans arterial aortic valve replacement. Electronically Signed   By: Zetta Bills M.D.   On: 09/07/2022 18:08      IMPRESSION AND PLAN:  Assessment and Plan: * Acute on chronic systolic (congestive) heart failure (HCC)  -  The patient will be admitted to a cardiac telemetry bed. - We will continue diuresis with IV Lasix. - We will continue Aldactone and Toprol-XL. - We Will follow serial troponins. - We will follow I's and O's and daily weights. - Cardiology consult be obtained. - I notified Salley Hews about the patient.   Paroxysmal atrial fibrillation with RVR (HCC) - Heart rate is currently controlled. - We will continue Toprol-XL. - We will continue Eliquis.  Acute respiratory failure with hypoxia (HCC) - This is clearly secondary to #1. - O2 protocol will be followed. - Management otherwise as above.  Dyslipidemia - We will continue statin therapy.  Essential hypertension - We will continue and Toprol-XL.  Type 2 diabetes mellitus with chronic kidney disease, without long-term current use of insulin (HCC) - The patient will be placed on supplemental coverage with NovoLog.  Vitamin B12 deficiency - We will continue vitamin B12.   DVT prophylaxis: Lovenox.  Advanced Care Planning:  Code Status: full code.  Family Communication:  The plan of care was discussed in details with the patient (and family). I answered all questions. The patient agreed to proceed with the above mentioned plan. Further management will  depend upon hospital course. Disposition Plan: Back to previous home environment Consults called: none.  All the records are reviewed and case discussed with ED provider.  Status is: Inpatient   At the time of the admission, it appears that the appropriate admission status for this patient is inpatient.  This is judged to be reasonable and necessary in order to provide the required intensity of service to ensure the patient's safety given the presenting symptoms, physical exam findings and initial radiographic and laboratory data in the context of comorbid conditions.  The patient requires inpatient status due to high intensity of service, high risk of further deterioration and high frequency of surveillance required.  I certify that at the time of admission, it is my clinical judgment that the patient will require inpatient hospital care extending more than 2 midnights.                            Dispo: The patient is from: Home              Anticipated d/c is to: Home              Patient currently is not medically stable to d/c.              Difficult to place patient: No  Hannah Beat M.D on 09/07/2022 at 10:28 PM  [facility]   From 7 PM-7 AM, contact night-coverage [email or web address]  CC: Primary care physician; Shirline Frees, NP

## 2022-09-07 NOTE — ED Notes (Signed)
Pt care handoff to [facility]

## 2022-09-07 NOTE — Assessment & Plan Note (Signed)
-  This is clearly secondary to #1. - O2 protocol will be followed. - Management otherwise as above.

## 2022-09-07 NOTE — ED Triage Notes (Signed)
Pt BIB [facility] from a [facility]. Pt shortness of breath and a near syncope episode. EMS reported pt was in a tripod position. Fire department placed her on a non rebreather. Pt was using her accessory muscle. EM\S placed her on nasal canula on 4Liter and SP02 98%. Pt report she is having tightness around her abdomen. BP 88/46. Pulse 92, RR. 30, cbg 138. EMS reported EKG showed she had Left BBB. Pt have Hx of Afib. Pr is A & O x4

## 2022-09-07 NOTE — ED Provider Notes (Signed)
[place] EMERGENCY DEPARTMENT AT [facility] Provider Note   CSN: [id number] Arrival date & time: 09/07/22  1702     History  Chief Complaint  Patient presents with   Shortness of Breath   Near Syncope    Tara Fields is a 89 y.o. female.  HPI 89 year old female history of CHF, who lives in independent living facility presents today with onset of substernal pressure, weakness, and dyspnea.  Her son reports that she has had some decreased activity since she hyperextended her knee on Christmas Eve.  She has been seen several times and has been getting more mobile.  She went to an exercise session today.  On return from that, she complained of weakness and had increased dyspnea.  There is no report of cough, fever, chills.  She has received her most recent COVID vaccine.  She feels this is most consistent with prior episodes of CHF    Home Medications Prior to Admission medications   Medication Sig Start Date End Date Taking? Authorizing Provider  acetaminophen (TYLENOL) 500 MG tablet Take 500 mg by mouth 3 (three) times daily as needed for moderate pain. Take with Tramadol    [provider]  atorvastatin (LIPITOR) 40 MG tablet TAKE 1 TABLET BY MOUTH DAILY AT 6 PM. 06/18/22   Nafziger, Kandee Keen, NP  cephALEXin (KEFLEX) 250 MG capsule Take 1 capsule (250 mg total) by mouth daily. 06/24/22   Nafziger, Kandee Keen, NP  cholecalciferol (VITAMIN D3) 25 MCG (1000 UNIT) tablet Take 1,000 Units by mouth 2 (two) times daily.    [provider]  Coenzyme Q10 (CO Q-10) 300 MG CAPS Take 300 mg by mouth in the morning.    [provider]  ELIQUIS 5 MG TABS tablet TAKE 1 TABLET BY MOUTH TWICE A DAY 09/01/22   Nafziger, Kandee Keen, NP  Ferrous Gluconate 324 (37.5 Fe) MG TABS Take 1 tablet (324 mg total) by mouth daily with breakfast. Patient taking differently: Take 324 mg by mouth daily with breakfast. Every other day 09/09/21   Nafziger, Kandee Keen, NP  Lidocaine-Menthol, Spray,  4-1 % LIQD Apply 1 spray topically daily as needed (back/knee pain/restless legs).    [provider]  Magnesium 200 MG TABS Take 200 mg by mouth every evening.    [provider]  metoprolol succinate (TOPROL-XL) 50 MG 24 hr tablet TAKE 1 TABLET (50 MG TOTAL) BY MOUTH TWICE A DAY .TAKE WITH OR IMMEDIATELY FOLLOWING A MEAL 05/11/22   Nafziger, Kandee Keen, NP  Multiple Vitamin (MULTIVITAMIN WITH MINERALS) TABS tablet Take 1 tablet by mouth 2 (two) times daily. Centrum Minis    [provider]  potassium chloride 20 MEQ/15ML (10%) SOLN Take 15 mLs (20 mEq total) by mouth daily. Patient taking differently: Take 20 mEq by mouth daily. 7 Mls daily 05/19/21 08/17/22  Dyann Kief, PA-C  psyllium (METAMUCIL) 58.6 % powder Take 1 packet by mouth daily as needed (for constipation).    [provider]  spironolactone (ALDACTONE) 25 MG tablet TAKE 1/2 TABLET BY MOUTH DAILY 05/11/22   Nafziger, Kandee Keen, NP  terazosin (HYTRIN) 10 MG capsule TAKE 1 CAPSULE BY MOUTH AT BEDTIME. 05/11/22   Nafziger, Kandee Keen, NP  torsemide (DEMADEX) 20 MG tablet TAKE 2 TABLETS (40 MG TOTAL) BY MOUTH DAILY. 05/11/22   Nafziger, Kandee Keen, NP  vitamin B-12 (CYANOCOBALAMIN) 1000 MCG tablet Take 1 tablet (1,000 mcg total) by mouth daily. 04/16/21   Meredeth Ide, MD      Allergies  Amlodipine besy-benazepril hcl, Hctz [hydrochlorothiazide], Other, Cyclobenzaprine, Diclofenac sodium, Hydralazine, Methylprednisolone, and Oxycodone    Review of Systems   Review of Systems  Physical Exam Updated Vital Signs BP 125/68   Pulse (!) 117   Temp 97.8 F (36.6 C)   Resp 18   Ht 1.524 m (5')   Wt 63.5 kg   SpO2 99%   BMI 27.34 kg/m  Physical Exam Vitals and nursing note reviewed.  Constitutional:      Appearance: She is well-developed.  HENT:     Head: Normocephalic.     Mouth/Throat:     Pharynx: Oropharynx is clear.  Eyes:     Pupils: Pupils are equal, round, and reactive to light.  Cardiovascular:      Rate and Rhythm: Tachycardia present. Rhythm irregular.  Musculoskeletal:     Cervical back: Normal range of motion.  Neurological:     Mental Status: She is alert.     ED Results / Procedures / Treatments   Labs (all labs ordered are listed, but only abnormal results are displayed) Labs Reviewed  CBC - Abnormal; Notable for the following components:      Result Value   RDW 16.1 (*)    All other components within normal limits  COMPREHENSIVE METABOLIC PANEL - Abnormal; Notable for the following components:   CO2 19 (*)    Glucose, Bld 142 (*)    BUN 62 (*)    Creatinine, Ser 1.45 (*)    GFR, Estimated 34 (*)    All other components within normal limits  BRAIN NATRIURETIC PEPTIDE - Abnormal; Notable for the following components:   B Natriuretic Peptide 1,839.2 (*)    All other components within normal limits  RESP PANEL BY RT-PCR (RSV, FLU A&B, COVID)  RVPGX2  URINALYSIS, ROUTINE W REFLEX MICROSCOPIC  TROPONIN I (HIGH SENSITIVITY)  TROPONIN I (HIGH SENSITIVITY)    EKG EKG Interpretation  Date/Time:  Tuesday September 07 2022 17:09:03 EST Ventricular Rate:  100 PR Interval:    QRS Duration: 178 QT Interval:  434 QTC Calculation: 571 R Axis:   198 Text Interpretation: Atrial fibrillation Nonspecific intraventricular conduction delay Borderline ST depression, diffuse leads Confirmed by Pattricia Boss [phone]) on 09/07/2022 8:39:38 PM  Radiology DG Chest Port 1 View  Result Date: 09/07/2022 CLINICAL DATA:  89 year old female presenting with dyspnea. EXAM: PORTABLE CHEST 1 VIEW COMPARISON:  April 23, 2022. FINDINGS: EKG leads project over the chest. Marked cardiomegaly as on previous imaging. Post trans arterial aortic valve replacement as on previous imaging. Aortic atherosclerosis. Hilar structures are stable. No lobar consolidation.  No gross pulmonary edema or pneumothorax. On limited assessment no acute skeletal process. IMPRESSION: 1. Marked cardiomegaly as on previous  imaging. 2. Post trans arterial aortic valve replacement. Electronically Signed   By: Zetta Bills M.D.   On: 09/07/2022 18:08    Procedures Procedures    Medications Ordered in ED Medications  furosemide (LASIX) injection 60 mg (60 mg Intravenous Given 09/07/22 2034)    ED Course/ Medical Decision Making/ A&P Clinical Course as of 09/07/22 2101  Tue Sep 07, 2022  2009 Marked cardiomegaly noted on chest x-Rosaleen Mazer [DR]  2010 BNP elevated [DR]    Clinical Course User Index [DR] Pattricia Boss, MD                             Medical Decision Making Amount and/or Complexity of Data Reviewed Labs: ordered. Radiology: ordered.  Risk Prescription drug management.   89 year-old female presents today with acute onset of dyspnea and new oxygen requirement patient evaluated here in ED with EKG, chest x-Grethel Zenk, and labs. EKG with A-fib with RVR rate controlled at about 100 with aberrant conduction stable from prior. Chest x-Jaray Boliver with cardiomegaly and some increased markings without overt pulmonary edema BNP is elevated at 1839 Respiratory panel is negative Differential diagnosis includes but is not limited to acute congestive heart failure, infectious etiologies including pneumonia and viral etiologies, metabolic abnormalities. Labs, imaging, EKG most consistent with congestive heart failure and patient treated here in the ED with Lasix. Patient has continued to need oxygen here but saturations have been in the upper 90s Care was discussed with Dr. Jarome Lamas and she will be admitted for further evaluation and management         Final Clinical Impression(s) / ED Diagnoses Final diagnoses:  Dyspnea, unspecified type  Acute on chronic congestive heart failure, unspecified heart failure type [facility])    Rx / DC Orders ED Discharge Orders     None         Margarita Grizzle, MD 09/07/22 2101

## 2022-09-07 NOTE — Assessment & Plan Note (Signed)
Continue blood pressure monitoring  

## 2022-09-07 NOTE — Assessment & Plan Note (Addendum)
Patient remained in atrial fibrillation, plan to continue metoprolol succinate one tablet daily of 50 mg Continue anticoagulation with apixaban.

## 2022-09-07 NOTE — Assessment & Plan Note (Signed)
-  We will continue vitamin B12.

## 2022-09-07 NOTE — ED Notes (Signed)
Pt is resting in bed with no acute distress or changes noted from original assessment.

## 2022-09-07 NOTE — Assessment & Plan Note (Addendum)
Echocardiogram with reduced LV systolic function to 20 to 25%, with global hypokinesis. LV internal cavity with sever dilatation.  RV with normal function. LA and RA with severe dilatation. PS TAVR.   Volume status has improved after IV furosemide. Blood pressure is stable.   Plan to continue medical therapy with torsemide and if stable resume guideline directed therapy.   Acute hypoxemic respiratory failure due to acute cardiogenic pulmonary edema Clinically improving with diuresis.

## 2022-09-07 NOTE — Assessment & Plan Note (Signed)
Continue with atorvastatin 

## 2022-09-07 NOTE — Assessment & Plan Note (Signed)
Her glucose remained stable, she was placed on insulin sliding scale during her hospitalization.

## 2022-09-08 DIAGNOSIS — I480 Paroxysmal atrial fibrillation: Secondary | ICD-10-CM | POA: Diagnosis not present

## 2022-09-08 DIAGNOSIS — I10 Essential (primary) hypertension: Secondary | ICD-10-CM | POA: Diagnosis not present

## 2022-09-08 DIAGNOSIS — I509 Heart failure, unspecified: Secondary | ICD-10-CM | POA: Insufficient documentation

## 2022-09-08 DIAGNOSIS — I5023 Acute on chronic systolic (congestive) heart failure: Secondary | ICD-10-CM | POA: Diagnosis not present

## 2022-09-08 DIAGNOSIS — E785 Hyperlipidemia, unspecified: Secondary | ICD-10-CM | POA: Diagnosis not present

## 2022-09-08 LAB — CBC
HCT: 36.9 % (ref 36.0–46.0)
Hemoglobin: 12.3 g/dL (ref 12.0–15.0)
MCH: 31.4 pg (ref 26.0–34.0)
MCHC: 33.3 g/dL (ref 30.0–36.0)
MCV: 94.1 fL (ref 80.0–100.0)
Platelets: 173 10*3/uL (ref 150–400)
RBC: 3.92 MIL/uL (ref 3.87–5.11)
RDW: 16.4 % — ABNORMAL HIGH (ref 11.5–15.5)
WBC: 5.3 10*3/uL (ref 4.0–10.5)
nRBC: 0 % (ref 0.0–0.2)

## 2022-09-08 LAB — TROPONIN I (HIGH SENSITIVITY): Troponin I (High Sensitivity): 20 ng/L — ABNORMAL HIGH (ref <18)

## 2022-09-08 LAB — BASIC METABOLIC PANEL
Anion gap: 10 (ref 5–15)
BUN: 54 mg/dL — ABNORMAL HIGH (ref 8–23)
CO2: 24 mmol/L (ref 22–32)
Calcium: 8.8 mg/dL — ABNORMAL LOW (ref 8.9–10.3)
Chloride: 102 mmol/L (ref 98–111)
Creatinine, Ser: 1.23 mg/dL — ABNORMAL HIGH (ref 0.44–1.00)
GFR, Estimated: 41 mL/min — ABNORMAL LOW (ref >60)
Glucose, Bld: 132 mg/dL — ABNORMAL HIGH (ref 70–99)
Potassium: 3.9 mmol/L (ref 3.5–5.1)
Sodium: 136 mmol/L (ref 135–145)

## 2022-09-08 LAB — CBG MONITORING, ED
Glucose-Capillary: 159 mg/dL — ABNORMAL HIGH (ref 70–99)
Glucose-Capillary: 191 mg/dL — ABNORMAL HIGH (ref 70–99)
Glucose-Capillary: 108 mg/dL — ABNORMAL HIGH (ref 70–99)

## 2022-09-08 LAB — HEMOGLOBIN A1C
Hgb A1c MFr Bld: 6.7 % — ABNORMAL HIGH (ref 4.8–5.6)
Mean Plasma Glucose: 145.59 mg/dL

## 2022-09-08 LAB — GLUCOSE, CAPILLARY: Glucose-Capillary: 116 mg/dL — ABNORMAL HIGH (ref 70–99)

## 2022-09-08 MED ORDER — MIDODRINE HCL 5 MG PO TABS
10.0000 mg | ORAL_TABLET | Freq: Three times a day (TID) | ORAL | Status: DC
  Administered 2022-09-08 (×3): 10 mg via ORAL
  Filled 2022-09-08 (×3): qty 2

## 2022-09-08 MED ORDER — TORSEMIDE 20 MG PO TABS
40.0000 mg | ORAL_TABLET | Freq: Every day | ORAL | Status: DC
  Administered 2022-09-09: 40 mg via ORAL
  Filled 2022-09-08: qty 2

## 2022-09-08 MED ORDER — MELATONIN 3 MG PO TABS: 3.0000 mg | ORAL_TABLET | Freq: Once | ORAL | Status: DC

## 2022-09-08 MED ORDER — SODIUM CHLORIDE 0.9 % IV BOLUS
250.0000 mL | Freq: Once | INTRAVENOUS | Status: AC
  Administered 2022-09-08: 250 mL via INTRAVENOUS

## 2022-09-08 MED ORDER — MELATONIN 3 MG PO TABS
3.0000 mg | ORAL_TABLET | Freq: Every evening | ORAL | Status: DC | PRN
  Filled 2022-09-08: qty 1

## 2022-09-08 NOTE — Consult Note (Addendum)
Cardiology Consultation   Patient ID: Ronni Osterberg MRN: [id number]; DOB: 05/28/1928  Admit date: 09/07/2022 Date of Consult: 09/08/2022  PCP:  [name], NP   [facility] Cardiologist:  Lauree Chandler, MD        Patient Profile:   Tara Fields is a 89 y.o. female with a hx of severe AS s/p TAVR at [place], CAD, persistent afib, bilateral carotid artery stenosis, COPD, DM type II, GERD, hypertension, dyslipidemia, tobacco use who is being seen 09/08/2022 for the evaluation of acute on chronic systolic heart failure at the request of Dr. Sidney Ace.  History of Present Illness:   Tara Fields presented to the ED on 1/23 with symptoms of exertional dyspnea, lower chest vs epigastric pain, and general malaise. Patient reports limited exertion since hyperextending her knee this past Christmas Eve, though she has been working on increasing mobility and level of activity. Patient attended an exercise class yesterday and felt short of breath/fatigued, requiring use of oxygen which she reportedly only uses sparingly at home. She also says that she had some epigastric/lower chest tightness in addition to shortness of breath. Patient adds that a couple of days ago, she had an episode of orthostatic dizziness and almost passed out. She has otherwise been without significant symptoms and denies orthopnea/need to elevate her sleeping position. Patient says her son was recently sick with influenza but she herself has not had symptoms of cough, fever, chills, myalgias. Patient's biggest concern at the time of my exam is frequent nocturnal leg cramping.   Past Medical History:  Diagnosis Date   Acute exacerbation of CHF (congestive heart failure) ([place]) 12/18/2020   AKI (acute kidney injury) ([place])    Aortic stenosis    s/p TAVR 26 mm Edwards Sapien 3 THV July 2020   CAD (coronary artery disease)    COPD (chronic obstructive pulmonary disease) (H[facility])    Diabetes mellitus  ([facility])    Diverticulitis    GERD (gastroesophageal reflux disease)    HTN (hypertension)    Hyperlipidemia    Legally blind in left eye, as defined in [place]    Tobacco abuse    UTI (urinary tract infection) 11/10/2021    Past Surgical History:  Procedure Laterality Date   ABDOMINAL HYSTERECTOMY     APPENDECTOMY     BACK SURGERY     CATARACT EXTRACTION, BILATERAL     CHOLECYSTECTOMY     COLONOSCOPY WITH PROPOFOL N/A 04/16/2021   Procedure: COLONOSCOPY WITH PROPOFOL;  Surgeon: Milus Banister, MD;  Location: [facility];  Service: Gastroenterology;  Laterality: N/A;   ESOPHAGOGASTRODUODENOSCOPY (EGD) WITH PROPOFOL N/A 04/15/2021   Procedure: ESOPHAGOGASTRODUODENOSCOPY (EGD) WITH PROPOFOL;  Surgeon: Doran Stabler, MD;  Location: [facility];  Service: Gastroenterology;  Laterality: N/A;     Home Medications:  Prior to Admission medications   Medication Sig Start Date End Date Taking? Authorizing Provider  acetaminophen (TYLENOL) 500 MG tablet Take 500 mg by mouth 3 (three) times daily as needed for moderate pain. Take with Tramadol   Yes [provider]  atorvastatin (LIPITOR) 40 MG tablet TAKE 1 TABLET BY MOUTH DAILY AT 6 PM. Patient taking differently: Take 40 mg by mouth daily. 06/18/22  Yes Nafziger, Tommi Rumps, NP  cephALEXin (KEFLEX) 250 MG capsule Take 1 capsule (250 mg total) by mouth daily. 06/24/22  Yes Nafziger, Tommi Rumps, NP  cholecalciferol (VITAMIN D3) 25 MCG (1000 UNIT) tablet Take 1,000 Units by mouth 2 (two) times daily.   Yes [provider]  Coenzyme Q10 (CO Q-10) 300 MG CAPS Take 300 mg by mouth in the morning.   Yes [provider]  ELIQUIS 5 MG TABS tablet TAKE 1 TABLET BY MOUTH TWICE A DAY 09/01/22  Yes Nafziger, Tommi Rumps, NP  Ferrous Gluconate 324 (37.5 Fe) MG TABS Take 1 tablet (324 mg total) by mouth daily with breakfast. Patient taking differently: Take 324 mg by mouth daily with breakfast. Every other day 09/09/21  Yes Nafziger, Tommi Rumps, NP   Lidocaine-Menthol, Spray, 4-1 % LIQD Apply 1 spray topically daily as needed (back/knee pain/restless legs).   Yes [provider]  Magnesium 200 MG TABS Take 200 mg by mouth every evening.   Yes [provider]  metoprolol succinate (TOPROL-XL) 50 MG 24 hr tablet TAKE 1 TABLET (50 MG TOTAL) BY MOUTH TWICE A DAY .TAKE WITH OR IMMEDIATELY FOLLOWING A MEAL Patient taking differently: Take 50 mg by mouth in the morning and at bedtime. 05/11/22  Yes Nafziger, Tommi Rumps, NP  Multiple Vitamin (MULTIVITAMIN WITH MINERALS) TABS tablet Take 1 tablet by mouth daily. Centrum Minis   Yes [provider]  potassium chloride 20 MEQ/15ML (10%) SOLN Take 15 mLs (20 mEq total) by mouth daily. Patient taking differently: Take 20 mEq by mouth daily. 7 Mls daily 05/19/21 09/09/23 Yes [name], PA-C  psyllium (METAMUCIL) 58.6 % powder Take 1 packet by mouth daily as needed (for constipation).   Yes [provider]  spironolactone (ALDACTONE) 25 MG tablet TAKE 1/2 TABLET BY MOUTH DAILY 05/11/22  Yes Nafziger, Tommi Rumps, NP  terazosin (HYTRIN) 10 MG capsule TAKE 1 CAPSULE BY MOUTH AT BEDTIME. 05/11/22  Yes Nafziger, Tommi Rumps, NP  torsemide (DEMADEX) 20 MG tablet TAKE 2 TABLETS (40 MG TOTAL) BY MOUTH DAILY. 05/11/22  Yes Nafziger, Tommi Rumps, NP  traMADol (ULTRAM) 50 MG tablet Take 50 mg by mouth 3 (three) times daily as needed for moderate pain. 08/20/22  Yes [provider]  vitamin B-12 (CYANOCOBALAMIN) 1000 MCG tablet Take 1 tablet (1,000 mcg total) by mouth daily. 04/16/21  Yes Oswald Hillock, MD    Inpatient Medications: Scheduled Meds:  apixaban  5 mg Oral BID   atorvastatin  40 mg Oral Daily   cholecalciferol  1,000 Units Oral BID   cyanocobalamin  1,000 mcg Oral Daily   ferrous gluconate  324 mg Oral Q breakfast   furosemide  40 mg Intravenous Q12H   insulin aspart  0-9 Units Subcutaneous TID WC   magnesium oxide  200 mg Oral QPM   midodrine  10 mg Oral TID WC   multivitamin with  minerals  1 tablet Oral BID   terazosin  10 mg Oral QHS   Continuous Infusions:  PRN Meds: acetaminophen **OR** acetaminophen, magnesium hydroxide, ondansetron **OR** ondansetron (ZOFRAN) IV, psyllium, traZODone  Allergies:    Allergies  Allergen Reactions   Amlodipine Besy-Benazepril Hcl Cough   Hctz [Hydrochlorothiazide] Other (See Comments)    Per family, every diuretic the patient has been on flushes out her out and ultimately results in CRAMPING/JAUNDICE (added potassium might help)   Other Other (See Comments)    History of diverticulitis- CANNOT TOLERATE NUTS, CORN, AND ANY FOODS NOT EASILY DIGESTED- the patient AVOIDS these   Cyclobenzaprine Other (See Comments)    Confusion- does not tolerate well    Diclofenac Sodium Diarrhea   Hydralazine Other (See Comments)    Per family, every diuretic the patient has been on flushes out her out and ultimately results in CRAMPING/JAUNDICE (added potassium might help)  Methylprednisolone Other (See Comments)    Medrol/CORTICOSTEROIDS = A LOT OF ENERGY Delusions    Oxycodone Other (See Comments)    Patient becomes very disorientated     Social History:   Social History   Socioeconomic History   Marital status: Widowed    Spouse name: Not on file   Number of children: 2   Years of education: Not on file   Highest education level: 12th grade  Occupational History   Occupation: Retired    Comment: homemaker  Tobacco Use   Smoking status: Former    Packs/day: 1.00    Years: 70.00    Total pack years: 70.00    Types: Cigarettes    Quit date: 12/15/2019    Years since quitting: 2.7   Smokeless tobacco: Never   Tobacco comments:    Pt states quiting a year ago. 01/03/20 ARJ  Vaping Use   Vaping Use: Never used  Substance and Sexual Activity   Alcohol use: No    Alcohol/week: 0.0 standard drinks of alcohol   Drug use: No   Sexual activity: Not Currently  Other Topics Concern   Not on file  Social History Narrative    Widowed- was married for 40 years    Has two sons       Social Determinants of Health   Financial Resource Strain: Low Risk  (08/14/2022)   Overall Financial Resource Strain (CARDIA)    Difficulty of Paying Living Expenses: Not hard at all  Food Insecurity: No Food Insecurity (08/14/2022)   Hunger Vital Sign    Worried About Running Out of Food in the Last Year: Never true    Ran Out of Food in the Last Year: Never true  Transportation Needs: No Transportation Needs (08/14/2022)   PRAPARE - Hydrologist (Medical): No    Lack of Transportation (Non-Medical): No  Physical Activity: Unknown (08/14/2022)   Exercise Vital Sign    Days of Exercise per Week: 0 days    Minutes of Exercise per Session: Not on file  Stress: No Stress Concern Present (08/14/2022)   [place]    Feeling of Stress : Only a little  Social Connections: Socially Isolated (08/14/2022)   Social Connection and Isolation Panel [NHANES]    Frequency of Communication with Friends and Family: More than three times a week    Frequency of Social Gatherings with Friends and Family: Three times a week    Attends Religious Services: Never    Active Member of Clubs or Organizations: No    Attends Archivist Meetings: Not on file    Marital Status: Widowed  Intimate Partner Violence: Not At Risk (03/17/2021)   Humiliation, Afraid, Rape, and Kick questionnaire    Fear of Current or Ex-Partner: No    Emotionally Abused: No    Physically Abused: No    Sexually Abused: No    Family History:    Family History  Problem Relation Age of Onset   Diabetes Mother    Stroke Brother    Cerebral palsy Brother    Coronary artery disease Neg Hx      ROS:  Please see the history of present illness.   All other ROS reviewed and negative.     Physical Exam/Data:   Vitals:   09/08/22 0611 09/08/22 1028 09/08/22 1100 09/08/22  1200  BP: (!) 104/59 109/68 (!) 104/54 102/60  Pulse: 72 77 (!) 47  78  Resp: 17 13 17  (!) 26  Temp: 98.5 F (36.9 C) 98.5 F (36.9 C)    TempSrc:  Oral    SpO2: 99% 100% 98% 98%  Weight:      Height:        Intake/Output Summary (Last 24 hours) at 09/08/2022 1301 Last data filed at 09/08/2022 0210 Gross per 24 hour  Intake 250 ml  Output 500 ml  Net -250 ml      09/07/2022    5:27 PM 08/17/2022    3:00 PM 08/09/2022    6:18 PM  Last 3 Weights  Weight (lbs) 140 lb 143 lb 134 lb 7.7 oz  Weight (kg) 63.504 kg 64.864 kg 61 kg     Body mass index is 27.34 kg/m.  General:  Well nourished, well developed, in no acute distress. On O2 via [place] HEENT: normal Neck: no significant elevation of JVP Vascular: No carotid bruits; Distal pulses 2+ bilaterally Cardiac:  normal S1, S2; irregularly irregular; no murmur  Lungs:  clear to auscultation bilaterally, no wheezing, rhonchi or rales  Abd: soft, nontender, no hepatomegaly  Ext: no edema Musculoskeletal:  No deformities, BUE and BLE strength normal and equal Skin: warm and dry  Neuro:  CNs 2-12 intact, no focal abnormalities noted Psych:  Normal affect   EKG:  The EKG was personally reviewed and demonstrates:  atrial fibrillation with LBBB pattern Telemetry:  Telemetry was personally reviewed and demonstrates:  atrial fibrillation, rates <100.  Relevant CV Studies:  11/09/21 TTE  IMPRESSIONS     1. S/P TAVR with mild perivalvular AI; no change compared to previous.   2. Left ventricular ejection fraction, by estimation, is 20 to 25%. The  left ventricle has severely decreased function. The left ventricle  demonstrates global hypokinesis. The left ventricular internal cavity size  was severely dilated. Left ventricular  diastolic function could not be evaluated.   3. Right ventricular systolic function is normal. The right ventricular  size is normal. There is moderately elevated pulmonary artery systolic  pressure.   4.  Left atrial size was severely dilated.   5. Right atrial size was severely dilated.   6. The mitral valve is abnormal. Mild mitral valve regurgitation. No  evidence of mitral stenosis.   7. The aortic valve has been repaired/replaced. Aortic valve  regurgitation is mild. No aortic stenosis is present. There is a 26 mm  Sapien prosthetic (TAVR) valve present in the aortic position. Procedure  Date: 03/07/19.   8. The inferior vena cava is dilated in size with <50% respiratory  variability, suggesting right atrial pressure of 15 mmHg.   FINDINGS   Left Ventricle: Left ventricular ejection fraction, by estimation, is 20  to 25%. The left ventricle has severely decreased function. The left  ventricle demonstrates global hypokinesis. Definity contrast agent was  given IV to delineate the left  ventricular endocardial borders. The left ventricular internal cavity size  was severely dilated. There is no left ventricular hypertrophy. Abnormal  (paradoxical) septal motion, consistent with left bundle branch block.  Left ventricular diastolic function   could not be evaluated due to atrial fibrillation. Left ventricular  diastolic function could not be evaluated.   Right Ventricle: The right ventricular size is normal. Right ventricular  systolic function is normal. There is moderately elevated pulmonary artery  systolic pressure. The tricuspid regurgitant velocity is 3.08 m/s, and  with an assumed right atrial  pressure of 15 mmHg, the estimated right ventricular systolic pressure  is  52.9 mmHg.   Left Atrium: Left atrial size was severely dilated.   Right Atrium: Right atrial size was severely dilated.   Pericardium: There is no evidence of pericardial effusion.   Mitral Valve: The mitral valve is abnormal. There is moderate thickening  of the mitral valve leaflet(s). Mild mitral annular calcification. Mild  mitral valve regurgitation. No evidence of mitral valve stenosis.   Tricuspid  Valve: The tricuspid valve is normal in structure. Tricuspid  valve regurgitation is mild . No evidence of tricuspid stenosis.   Aortic Valve: The aortic valve has been repaired/replaced. Aortic valve  regurgitation is mild. No aortic stenosis is present. Aortic valve mean  gradient measures 6.7 mmHg. Aortic valve peak gradient measures 12.9 mmHg.  Aortic valve area, by VTI measures  2.06 cm. There is a 26 mm Sapien prosthetic, stented (TAVR) valve present  in the aortic position. Procedure Date: 03/07/19.   Pulmonic Valve: The pulmonic valve was normal in structure. Pulmonic valve  regurgitation is mild. No evidence of pulmonic stenosis.   Aorta: The aortic root is normal in size and structure.   Venous: The inferior vena cava is dilated in size with less than 50%  respiratory variability, suggesting right atrial pressure of 15 mmHg.   IAS/Shunts: No atrial level shunt detected by color flow Doppler.   Additional Comments: S/P TAVR with mild perivalvular AI; no change  compared to previous.    Laboratory Data:  High Sensitivity Troponin:   Recent Labs  Lab 09/07/22 1743 09/07/22 1955 09/07/22 2233 09/08/22 0033  TROPONINIHS 14 15 19* 20*     Chemistry Recent Labs  Lab 09/07/22 1743 09/08/22 0610  NA 136 136  K 4.8 3.9  CL 102 102  CO2 19* 24  GLUCOSE 142* 132*  BUN 62* 54*  CREATININE 1.45* 1.23*  CALCIUM 9.2 8.8*  GFRNONAA 34* 41*  ANIONGAP 15 10    Recent Labs  Lab 09/07/22 1743  PROT 6.7  ALBUMIN 3.8  AST 32  ALT 17  ALKPHOS 86  BILITOT 0.7   Lipids No results for input(s): "CHOL", "TRIG", "HDL", "LABVLDL", "LDLCALC", "CHOLHDL" in the last 168 hours.  Hematology Recent Labs  Lab 09/07/22 1743 09/08/22 0610  WBC 7.2 5.3  RBC 4.21 3.92  HGB 13.1 12.3  HCT 40.4 36.9  MCV 96.0 94.1  MCH 31.1 31.4  MCHC 32.4 33.3  RDW 16.1* 16.4*  PLT 193 173   Thyroid No results for input(s): "TSH", "FREET4" in the last 168 hours.  BNP Recent Labs  Lab  09/07/22 1744  BNP 1,839.2*    DDimer No results for input(s): "DDIMER" in the last 168 hours.   Radiology/Studies:  DG Chest Port 1 View  Result Date: 09/07/2022 CLINICAL DATA:  89 year old female presenting with dyspnea. EXAM: PORTABLE CHEST 1 VIEW COMPARISON:  April 23, 2022. FINDINGS: EKG leads project over the chest. Marked cardiomegaly as on previous imaging. Post trans arterial aortic valve replacement as on previous imaging. Aortic atherosclerosis. Hilar structures are stable. No lobar consolidation.  No gross pulmonary edema or pneumothorax. On limited assessment no acute skeletal process. IMPRESSION: 1. Marked cardiomegaly as on previous imaging. 2. Post trans arterial aortic valve replacement. Electronically Signed   By: Zetta Bills M.D.   On: 09/07/2022 18:08     Assessment and Plan:  Tara Fields is a 89 y.o. female with a hx of severe AS s/p TAVR at [place], CAD, persistent afib, bilateral carotid artery stenosis, COPD, DM type II, GERD,  hypertension, dyslipidemia, tobacco use who is being seen 09/08/2022 for the evaluation of acute on chronic systolic heart failure at the request of Dr. Sidney Ace.  Acute on chronic systolic heart failure  Patient admitted for evaluation and management of chest pain vs epigastric pain with dyspnea and generalized fatigue. Found to have BNP elevated to 1839.2. Last echo 11/09/21 with LVEF 20-25%, global hypokinesis. This admission, patient has received IV lasix 60mg  and 40mg . Indeterminate I/O tracking due to location in the ED.   On physical exam, patient doesn't have evidence of volume overload and is warm/well-perfused. Of note, last admission in March 2023, she had a very similarly elevated BNP with Reds Clip not suggestive of fluid accumulation. CXR this admission also not significant for overt pulmonary edema. Would not continue with IV diuresis at this time. Plan to resume home Torsemide tomorrow. Not sure why patient is receiving  Metoprolol, Spironolactone, and Midodrine. Will discontinue Metoprolol, Spironolactone, and Midodrine so that BP can be reassessed. Potentially will need to decrease Metoprolol dosing as she reports some orthostatic symptoms at home. No ACEi/ARB/ARNI due to renal insufficiency Continue close monitoring of I/O. Check daily standing weights Repeat echocardiogram   Chronic atrial fibrillation Secondary hypercoagulable state CHA2DS2-VASc Score = 7  Patient with known hx chronic afib. Has been maintained on Metoprolol Succinate 50mg  and Eliquis 5mg .   Metoprolol discontinued given acute HF and hypotension. May be able to resume tomorrow, perhaps at lower dose as patient also reports some orthostatic symptoms at home. Continue Eliquis 5mg . Patient will need close monitoring of renal function with aggressive diuresis as she nearly meets criteria for reduced dose Eliquis.  Hx CAD  Patient with known CAD per last cath 10/2017 at [place] (25% mid LAD stenosis, 50% stenosis second diagonal branch, 50% distal Circumflex stenosis, 75% ostial stenosis of a small diagonal branch). Troponin high normal to slightly elevated this admission: 14->15->19->20. ECG tracing this admission with baseline artifact making interpretation challenging. However, know significant ST segment or T wave changes noted as compared with 04/27/22 ECG. Chronic LBBB with afib also noted.  Patient has previously declined ischemic evaluation given advanced age, continues to do so which is completely understandable. Suspect troponin leak is 2/2 HF exacerbation. Continue statin  Severe AS s/p TAVR  Patient underwent TAVR with 59mm valve at [facility] in July 2021. Per March 2023 echo, mean gradient 6.61mmHg, peak gradient 12.65mmHg.  Continue Eliquis. No ASA  Carotid artery disease  Patient with hx bilateral carotid artery stenosis. No stents placed per patient. Given her age, would not recommend serial surveillance.   Risk Assessment/Risk  Scores:        New York Heart Association (NYHA) Functional Class NYHA Class III  CHA2DS2-VASc Score = 7   This indicates a 11.2% annual risk of stroke. The patient's score is based upon: CHF History: 1 HTN History: 1 Diabetes History: 1 Stroke History: 0 Vascular Disease History: 1 Age Score: 2 Gender Score: 1         For questions or updates, please contact [facility] [place] Please consult [email or web address] for contact info under    Signed, Lily Kocher, PA-C  09/08/2022 1:01 PM  I have personally seen and examined this patient. I agree with the assessment and plan as outlined above. 89 yo female with severe AS s/p TAVR, CAD, persistent atrial fib, carotid artery disease, DM, COPD, HTN, HLD, NICM, chronic systolic CHF and tobacco abuse admitted with epigastric pain and dyspnea. No clear volume overload on exam. Weight is  up 5 lbs over last six months. BNP is chronically elevated and 1800 today.  EKG reviewed by me and shows atrial fib with LBBB Labs reviewed by me. Troponin negative x 2. (19-->20). BNP is elevated.  My exam: General: Well developed, well nourished, NAD HEENT: OP clear, mucus membranes moist SKIN: warm, dry. No rashes. Neuro: No focal deficits Musculoskeletal: Muscle strength 5/5 all ext Psychiatric: Mood and affect normal Neck: No JVD Lungs:Clear bilaterally, no wheezes, rhonci, crackles Cardiovascular: Irreg irreg. Systolic murmur.  Abdomen:Soft. Bowel sounds present. Non-tender. Extremities: No lower extremity edema.  Plan  She has vague complaints of dyspnea but no clear volume overload on exam. (No JVD, no LE edema, lung clear). BNP is chronically elevated. She has been given IV Lasix here.  -I would stop her Lasix now.  -Will not plan ischemic evaluation given advanced age.  -Will hold beta blocker with hypotension -echo to assess AVR, LV function  -Monitor on telemetry overnight  Lauree Chandler, MD, [facility] 09/08/2022 1:57  PM   Lauree Chandler, MD, [facility] 09/08/2022

## 2022-09-08 NOTE — Hospital Course (Signed)
Tara Fields was admitted to the hospital with the working diagnosis of decompensated heart failure.   89 yo female with the past medical history of coronary artery disease, COPD, hypertension, and T2DM who presented with chest pressure, dyspnea and near syncope. Patient had increase dyspnea and weakness after a outpatient exercise session, with increase 02 requirements up to 4 L per. Because worsening symptoms she presented to the ED. On her initial physical examination her blood pressure was 116/87, HR 87, RR 20 and 02 saturation 100%, lungs with decreased breath sounds, and bibasilar rales, heart with S1 and S2 present and rhythmic with no gallops, abdomen with no distention, no lower extremity edema.   Na 136, K 4,8 CL 102 bicarbonate 19 glucose 142, bun 62 and cr 1,45 BNP 1,839 High sensitive troponin 14 and 15  Wbc 7,2 hgb 13.1 plt 193  Sars covid 19 negative, influenza negative   Urine analysis SG 1,011, negative protein, negative leukocytes.   Chest radiograph with cardiomegaly, bilateral interstitial infiltrates with cephalization of the vasculature, no effusions.   EKG 100 bpm, right axis deviation, qtc 571, interventricular conduction delay, atrial fibrillation rhythm with poor R R wave progression, with no significant ST segment or T wave changes.   Patient received IV furosemide with improvement of her symptoms.  Plan to follow up as outpatient with cardiology.

## 2022-09-08 NOTE — Progress Notes (Signed)
  Progress Note   Patient: Tara Fields JAS:[id number] DOB: [birth year] DOA: 09/07/2022     1 DOS: the patient was seen and examined on 09/08/2022   Brief hospital course: Tara Fields was admitted to the hospital with the working diagnosis of decompensated heart failure.   89 yo female with the past medical history of coronary artery disease, COPD, hypertension, and T2DM who presented with chest pressure, dyspnea and near syncope. Patient had increase dyspnea and weakness after a outpatient exercise session, with increase 02 requirements up to 4 L per. Because worsening symptoms she presented to the ED. On her initial physical examination her blood pressure was 116/87, HR 87, RR 20 and 02 saturation 100%, lungs with decreased breath sounds, and bibasilar rales, heart with S1 and S2 present and rhythmic with no gallops, abdomen with no distention, no lower extremity edema.     Assessment and Plan: * Acute on chronic systolic (congestive) heart failure (HCC) Echocardiogram with reduced LV systolic function to 20 to 25%, with global hypokinesis. LV internal cavity with sever dilatation.  RV with normal function. LA and RA with severe dilatation. PS TAVR.   Volume status has improved after IV furosemide. Blood pressure is stable.   Plan to continue medical therapy with torsemide and if stable resume guideline directed therapy.   Acute hypoxemic respiratory failure due to acute cardiogenic pulmonary edema Clinically improving with diuresis.   Acute respiratory failure with hypoxia (HCC) - This is clearly secondary to #1. - O2 protocol will be followed. - Management otherwise as above.  Paroxysmal atrial fibrillation with RVR (HCC) - Heart rate is currently controlled. - We will continue Toprol-XL. - We will continue Eliquis.  Essential hypertension Continue blood pressure monitoring.   Dyslipidemia - We will continue statin therapy.  Type 2 diabetes mellitus with chronic  kidney disease, without long-term current use of insulin ([place]) - The patient will be placed on supplemental coverage with NovoLog.  Vitamin B12 deficiency - We will continue vitamin B12.        Subjective: Patient is feeling better today, her dyspnea has improved but not back to baseline, no chest pain   Physical Exam: Vitals:   09/08/22 1100 09/08/22 1200 09/08/22 1433 09/08/22 1500  BP: (!) 104/54 102/60  (!) 99/54  Pulse: (!) 47 78  63  Resp: 17 (!) 26  17  Temp:   98.5 F (36.9 C)   TempSrc:   Oral   SpO2: 98% 98%  95%  Weight:      Height:       Neurology awake and alert ENT with mild pallor Cardiovascular with S1 and S2 present and rhythmic with no gallops, rubs or murmurs Respiratory with mild rales but not wheezing or rhonchi Abdomen with no distention No lower extremity edema  Data Reviewed:    Family Communication: no family at the bedside   Disposition: Status is: Observation The patient remains OBS appropriate and will d/c before 2 midnights.  Planned Discharge Destination: Home     Author: Tawni Millers, MD 09/08/2022 3:24 PM  For on call review [email or web address].

## 2022-09-08 NOTE — ED Notes (Signed)
Pt C/O constipation, last BM was yesterday but very small. PRN given.

## 2022-09-08 NOTE — Care Management CC44 (Signed)
Condition Code 44 Documentation Completed  Patient Details  Name: Tayra Dawe MRN: [id number] Date of Birth: 1927-10-07   Condition Code 44 given:  Yes Patient signature on Condition Code 44 notice:  Yes Documentation of 2 MD's agreement:  Yes Code 44 added to claim:  Yes    Laurena Slimmer, RN 09/08/2022, 3:23 PM

## 2022-09-08 NOTE — Care Management Obs Status (Signed)
[facility] NOTIFICATION   Patient Details  Name: Tara Fields MRN: [id number] Date of Birth: [birth year]   Medicare Observation Status Notification Given:   yes   Laurena Slimmer, RN 09/08/2022, 3:22 PM

## 2022-09-08 NOTE — Progress Notes (Signed)
[facility] night cross cover note:   I was notified by RN that this patient is tired, complaining of difficulty sleeping.  I placed order for prn melatonin for insomnia.     Babs Bertin, DO Hospitalist

## 2022-09-08 NOTE — Plan of Care (Signed)

## 2022-09-08 NOTE — ED Notes (Signed)
Patient taken up to inpatient room in NAD with transit at this time. With all patient belongings.

## 2022-09-08 NOTE — ED Notes (Signed)
ED TO INPATIENT HANDOFF REPORT  ED Nurse Name and Phone #: Ferne Reus [phone]  S Name/Age/Gender Tara Fields 89 y.o. female Room/Bed: 046C/046C  Code Status   Code Status: DNR  Home/SNF/Other Skilled nursing facility Patient oriented to: self, place, time, and situation Is this baseline? Yes   Triage Complete: Triage complete  Chief Complaint Acute on chronic systolic (congestive) heart failure ([place]) [I50.23] Heart failure ([place]) [I50.9]  Triage Note Pt BIB [facility] from a [facility]. Pt shortness of breath and a near syncope episode. EMS reported pt was in a tripod position. Fire department placed her on a non rebreather. Pt was using her accessory muscle. EM\S placed her on nasal canula on 4Liter and SP02 98%. Pt report she is having tightness around her abdomen. BP 88/46. Pulse 92, RR. 30, cbg 138. EMS reported EKG showed she had Left BBB. Pt have Hx of Afib. Pr is A & O x4   Allergies Allergies  Allergen Reactions   Amlodipine Besy-Benazepril Hcl Cough   Hctz [Hydrochlorothiazide] Other (See Comments)    Per family, every diuretic the patient has been on flushes out her out and ultimately results in CRAMPING/JAUNDICE (added potassium might help)   Other Other (See Comments)    History of diverticulitis- CANNOT TOLERATE NUTS, CORN, AND ANY FOODS NOT EASILY DIGESTED- the patient AVOIDS these   Cyclobenzaprine Other (See Comments)    Confusion- does not tolerate well    Diclofenac Sodium Diarrhea   Hydralazine Other (See Comments)    Per family, every diuretic the patient has been on flushes out her out and ultimately results in CRAMPING/JAUNDICE (added potassium might help)   Methylprednisolone Other (See Comments)    Medrol/CORTICOSTEROIDS = A LOT OF ENERGY Delusions    Oxycodone Other (See Comments)    Patient becomes very disorientated     Level of Care/Admitting Diagnosis ED Disposition     ED Disposition  Admit   Condition  --   [place]: [place] [[id number]]  Level of Care: Telemetry Cardiac [[id number]]  May place patient in observation at [facility] or [place] if equivalent level of care is available:: No  Covid Evaluation: Asymptomatic - no recent exposure (last 10 days) testing not required  Diagnosis: Heart failure [facility]) [[id number]]  Admitting Physician: Tawni Millers [[id number]]  Attending Physician: Tawni Millers [[id number]]          B Medical/Surgery History Past Medical History:  Diagnosis Date   Acute exacerbation of CHF (congestive heart failure) ([place]) 12/18/2020   AKI (acute kidney injury) ([place])    Aortic stenosis    s/p TAVR 26 mm Edwards Sapien 3 THV July 2020   CAD (coronary artery disease)    COPD (chronic obstructive pulmonary disease) ([place])    Diabetes mellitus ([place])    Diverticulitis    GERD (gastroesophageal reflux disease)    HTN (hypertension)    Hyperlipidemia    Legally blind in left eye, as defined in [place]    Tobacco abuse    UTI (urinary tract infection) 11/10/2021   Past Surgical History:  Procedure Laterality Date   ABDOMINAL HYSTERECTOMY     APPENDECTOMY     BACK SURGERY     CATARACT EXTRACTION, BILATERAL     CHOLECYSTECTOMY     COLONOSCOPY WITH PROPOFOL N/A 04/16/2021   Procedure: COLONOSCOPY WITH PROPOFOL;  Surgeon: Milus Banister, MD;  Location: [facility];  Service: Gastroenterology;  Laterality: N/A;   ESOPHAGOGASTRODUODENOSCOPY (EGD) WITH  PROPOFOL N/A 04/15/2021   Procedure: ESOPHAGOGASTRODUODENOSCOPY (EGD) WITH PROPOFOL;  Surgeon: Doran Stabler, MD;  Location: [facility];  Service: Gastroenterology;  Laterality: N/A;     A IV Location/Drains/Wounds Patient Lines/Drains/Airways Status     Active Line/Drains/Airways     Name Placement date Placement time Site Days   Peripheral IV 09/07/22 20 G Left Antecubital 09/07/22  1800  Antecubital  1            Intake/Output Last 24 hours  Intake/Output Summary (Last  24 hours) at 09/08/2022 2135 Last data filed at 09/08/2022 0210 Gross per 24 hour  Intake 250 ml  Output --  Net 250 ml    Labs/Imaging Results for orders placed or performed during the hospital encounter of 09/07/22 (from the past 48 hour(s))  CBC     Status: Abnormal   Collection Time: 09/07/22  5:43 PM  Result Value Ref Range   WBC 7.2 4.0 - 10.5 K/uL   RBC 4.21 3.87 - 5.11 MIL/uL   Hemoglobin 13.1 12.0 - 15.0 g/dL   HCT 40.4 36.0 - 46.0 %   MCV 96.0 80.0 - 100.0 fL   MCH 31.1 26.0 - 34.0 pg   MCHC 32.4 30.0 - 36.0 g/dL   RDW 16.1 (H) 11.5 - 15.5 %   Platelets 193 150 - 400 K/uL   nRBC 0.0 0.0 - 0.2 %    Comment: Performed at [facility], [place] [address], [place], [place] [address]  Comprehensive metabolic panel     Status: Abnormal   Collection Time: 09/07/22  5:43 PM  Result Value Ref Range   Sodium 136 135 - 145 mmol/L   Potassium 4.8 3.5 - 5.1 mmol/L   Chloride 102 98 - 111 mmol/L   CO2 19 (L) 22 - 32 mmol/L   Glucose, Bld 142 (H) 70 - 99 mg/dL    Comment: Glucose reference range applies only to samples taken after fasting for at least 8 hours.   BUN 62 (H) 8 - 23 mg/dL   Creatinine, Ser 1.45 (H) 0.44 - 1.00 mg/dL   Calcium 9.2 8.9 - 10.3 mg/dL   Total Protein 6.7 6.5 - 8.1 g/dL   Albumin 3.8 3.5 - 5.0 g/dL   AST 32 15 - 41 U/L   ALT 17 0 - 44 U/L   Alkaline Phosphatase 86 38 - 126 U/L   Total Bilirubin 0.7 0.3 - 1.2 mg/dL   GFR, Estimated 34 (L) >60 mL/min    Comment: (NOTE) Calculated using the CKD-EPI Creatinine Equation (2021)    Anion gap 15 5 - 15    Comment: Performed at [facility] [address], [place], [place] [address]  Troponin I (High Sensitivity)     Status: None   Collection Time: 09/07/22  5:43 PM  Result Value Ref Range   Troponin I (High Sensitivity) 14 <18 ng/L    Comment: (NOTE) Elevated high sensitivity troponin I (hsTnI) values and significant  changes across serial measurements may suggest ACS but many other  chronic  and acute conditions are known to elevate hsTnI results.  Refer to the "Links" section for chest pain algorithms and additional  guidance. Performed at [facility], [place] [address], [place],  [address]   Brain natriuretic peptide     Status: Abnormal   Collection Time: 09/07/22  5:44 PM  Result Value Ref Range   B Natriuretic Peptide 1,839.2 (H) 0.0 - 100.0 pg/mL  Comment: Performed at [facility], [place] [address] [place], [place] [address]  Resp panel by RT-PCR (RSV, Flu A&B, Covid) Anterior Nasal Swab     Status: None   Collection Time: 09/07/22  5:44 PM   Specimen: Anterior Nasal Swab  Result Value Ref Range   SARS Coronavirus 2 by RT PCR NEGATIVE NEGATIVE    Comment: (NOTE) SARS-CoV-2 target nucleic acids are NOT DETECTED.  The SARS-CoV-2 RNA is generally detectable in upper respiratory specimens during the acute phase of infection. The lowest concentration of SARS-CoV-2 viral copies this assay can detect is 138 copies/mL. A negative result does not preclude SARS-Cov-2 infection and should not be used as the sole basis for treatment or other patient management decisions. A negative result may occur with  improper specimen collection/handling, submission of specimen other than nasopharyngeal swab, presence of viral mutation(s) within the areas targeted by this assay, and inadequate number of viral copies(<138 copies/mL). A negative result must be combined with clinical observations, patient history, and epidemiological information. The expected result is Negative.  Fact Sheet for Patients:  EntrepreneurPulse.com.au  Fact Sheet for Healthcare Providers:  IncredibleEmployment.be  This test is no t yet approved or cleared by the [place] FDA and  has been authorized for detection and/or diagnosis of SARS-CoV-2 by FDA under an Emergency Use Authorization (EUA). This EUA will remain  in effect (meaning this test can  be used) for the duration of the COVID-19 declaration under Section 564(b)(1) of the Act, 21 U.S.C.section 360bbb-3(b)(1), unless the authorization is terminated  or revoked sooner.       Influenza A by PCR NEGATIVE NEGATIVE   Influenza B by PCR NEGATIVE NEGATIVE    Comment: (NOTE) The Xpert Xpress SARS-CoV-2/FLU/RSV plus assay is intended as an aid in the diagnosis of influenza from Nasopharyngeal swab specimens and should not be used as a sole basis for treatment. Nasal washings and aspirates are unacceptable for Xpert Xpress SARS-CoV-2/FLU/RSV testing.  Fact Sheet for Patients: EntrepreneurPulse.com.au  Fact Sheet for Healthcare Providers: IncredibleEmployment.be  This test is not yet approved or cleared by the [place] FDA and has been authorized for detection and/or diagnosis of SARS-CoV-2 by FDA under an Emergency Use Authorization (EUA). This EUA will remain in effect (meaning this test can be used) for the duration of the COVID-19 declaration under Section 564(b)(1) of the Act, 21 U.S.C. section 360bbb-3(b)(1), unless the authorization is terminated or revoked.     Resp Syncytial Virus by PCR NEGATIVE NEGATIVE    Comment: (NOTE) Fact Sheet for Patients: EntrepreneurPulse.com.au  Fact Sheet for Healthcare Providers: IncredibleEmployment.be  This test is not yet approved or cleared by the [place] FDA and has been authorized for detection and/or diagnosis of SARS-CoV-2 by FDA under an Emergency Use Authorization (EUA). This EUA will remain in effect (meaning this test can be used) for the duration of the COVID-19 declaration under Section 564(b)(1) of the Act, 21 U.S.C. section 360bbb-3(b)(1), unless the authorization is terminated or revoked.  Performed at [facility], [place] [address], [place],  [address]   Urinalysis, Routine w reflex microscopic Urine, In & Out Cath      Status: None   Collection Time: 09/07/22  7:19 PM  Result Value Ref Range   Color, Urine YELLOW YELLOW   APPearance CLEAR CLEAR   Specific Gravity, Urine 1.011 1.005 - 1.030   pH 7.0 5.0 - 8.0   Glucose, UA NEGATIVE NEGATIVE mg/dL   Hgb urine dipstick NEGATIVE  NEGATIVE   Bilirubin Urine NEGATIVE NEGATIVE   Ketones, ur NEGATIVE NEGATIVE mg/dL   Protein, ur NEGATIVE NEGATIVE mg/dL   Nitrite NEGATIVE NEGATIVE   Leukocytes,Ua NEGATIVE NEGATIVE    Comment: Performed at [facility] [address] [place], [place] [address]  Troponin I (High Sensitivity)     Status: None   Collection Time: 09/07/22  7:55 PM  Result Value Ref Range   Troponin I (High Sensitivity) 15 <18 ng/L    Comment: (NOTE) Elevated high sensitivity troponin I (hsTnI) values and significant  changes across serial measurements may suggest ACS but many other  chronic and acute conditions are known to elevate hsTnI results.  Refer to the "Links" section for chest pain algorithms and additional  guidance. Performed at [facility], [place] [address] [place], [place] [address]   Troponin I (High Sensitivity)     Status: Abnormal   Collection Time: 09/07/22 10:33 PM  Result Value Ref Range   Troponin I (High Sensitivity) 19 (H) <18 ng/L    Comment: (NOTE) Elevated high sensitivity troponin I (hsTnI) values and significant  changes across serial measurements may suggest ACS but many other  chronic and acute conditions are known to elevate hsTnI results.  Refer to the "Links" section for chest pain algorithms and additional  guidance. Performed at [facility], [place] [address], [place], [place] [address]   Hemoglobin A1c     Status: Abnormal   Collection Time: 09/08/22 12:33 AM  Result Value Ref Range   Hgb A1c MFr Bld 6.7 (H) 4.8 - 5.6 %    Comment: (NOTE) Pre diabetes:          5.7%-6.4%  Diabetes:              >6.4%  Glycemic control for   <7.0% adults with diabetes    Mean Plasma  Glucose 145.59 mg/dL    Comment: Performed at [facility] [address], [place], [place] [address]  Troponin I (High Sensitivity)     Status: Abnormal   Collection Time: 09/08/22 12:33 AM  Result Value Ref Range   Troponin I (High Sensitivity) 20 (H) <18 ng/L    Comment: (NOTE) Elevated high sensitivity troponin I (hsTnI) values and significant  changes across serial measurements may suggest ACS but many other  chronic and acute conditions are known to elevate hsTnI results.  Refer to the "Links" section for chest pain algorithms and additional  guidance. Performed at [facility], [place] [address], [place], [place] [address]   Basic metabolic panel     Status: Abnormal   Collection Time: 09/08/22  6:10 AM  Result Value Ref Range   Sodium 136 135 - 145 mmol/L   Potassium 3.9 3.5 - 5.1 mmol/L   Chloride 102 98 - 111 mmol/L   CO2 24 22 - 32 mmol/L   Glucose, Bld 132 (H) 70 - 99 mg/dL    Comment: Glucose reference range applies only to samples taken after fasting for at least 8 hours.   BUN 54 (H) 8 - 23 mg/dL   Creatinine, Ser 1.23 (H) 0.44 - 1.00 mg/dL   Calcium 8.8 (L) 8.9 - 10.3 mg/dL   GFR, Estimated 41 (L) >60 mL/min    Comment: (NOTE) Calculated using the CKD-EPI Creatinine Equation (2021)    Anion gap 10 5 - 15    Comment: Performed at [facility] [address] [place], [place] [address]  CBC     Status: Abnormal   Collection Time: 09/08/22  6:10 AM  Result Value Ref Range   WBC 5.3 4.0 - 10.5 K/uL   RBC 3.92 3.87 - 5.11 MIL/uL   Hemoglobin 12.3 12.0 - 15.0 g/dL   HCT 36.9 36.0 - 46.0 %   MCV 94.1 80.0 - 100.0 fL   MCH 31.4 26.0 - 34.0 pg   MCHC 33.3 30.0 - 36.0 g/dL   RDW 16.4 (H) 11.5 - 15.5 %   Platelets 173 150 - 400 K/uL   nRBC 0.0 0.0 - 0.2 %    Comment: Performed at [facility], [place] [address] [place], [place] [address]  CBG monitoring, ED     Status: Abnormal   Collection Time: 09/08/22  7:48 AM  Result Value Ref Range    Glucose-Capillary 159 (H) 70 - 99 mg/dL    Comment: Glucose reference range applies only to samples taken after fasting for at least 8 hours.  CBG monitoring, ED     Status: Abnormal   Collection Time: 09/08/22 12:15 PM  Result Value Ref Range   Glucose-Capillary 191 (H) 70 - 99 mg/dL    Comment: Glucose reference range applies only to samples taken after fasting for at least 8 hours.  CBG monitoring, ED     Status: Abnormal   Collection Time: 09/08/22  5:10 PM  Result Value Ref Range   Glucose-Capillary 108 (H) 70 - 99 mg/dL    Comment: Glucose reference range applies only to samples taken after fasting for at least 8 hours.   DG Chest Port 1 View  Result Date: 09/07/2022 CLINICAL DATA:  89 year old female presenting with dyspnea. EXAM: PORTABLE CHEST 1 VIEW COMPARISON:  April 23, 2022. FINDINGS: EKG leads project over the chest. Marked cardiomegaly as on previous imaging. Post trans arterial aortic valve replacement as on previous imaging. Aortic atherosclerosis. Hilar structures are stable. No lobar consolidation.  No gross pulmonary edema or pneumothorax. On limited assessment no acute skeletal process. IMPRESSION: 1. Marked cardiomegaly as on previous imaging. 2. Post trans arterial aortic valve replacement. Electronically Signed   By: Zetta Bills M.D.   On: 09/07/2022 18:08    Pending Labs Unresulted Labs (From admission, onward)     Start     Ordered   09/09/22 7619  Basic metabolic panel  Tomorrow morning,   R        09/08/22 1527   09/09/22 0500  Magnesium  Tomorrow morning,   R        09/08/22 1527            Vitals/Pain Today's Vitals   09/08/22 1936 09/08/22 2018 09/08/22 2045 09/08/22 2100  BP: (!) 102/55 (!) 102/46  110/69  Pulse: 66 67 66   Resp: 17 16  17   Temp:      TempSrc:      SpO2: 96% 96%  96%  Weight:      Height:      PainSc:        Isolation Precautions No active isolations  Medications Medications  atorvastatin (LIPITOR) tablet 40 mg  (40 mg Oral Given 09/08/22 1030)  terazosin (HYTRIN) capsule 10 mg (10 mg Oral Given 09/08/22 0022)  psyllium (HYDROCIL/METAMUCIL) 1 packet (has no administration in time range)  apixaban (ELIQUIS) tablet 5 mg (5 mg Oral Given 09/08/22 1030)  ferrous gluconate (FERGON) tablet 324 mg (324 mg Oral Given 09/08/22 1308)  cyanocobalamin (VITAMIN B12) tablet 1,000 mcg (1,000 mcg  Oral Given 09/08/22 1030)  cholecalciferol (VITAMIN D3) 25 MCG (1000 UNIT) tablet 1,000 Units (1,000 Units Oral Given 09/08/22 1030)  multivitamin with minerals tablet 1 tablet (1 tablet Oral Given 09/08/22 1030)  acetaminophen (TYLENOL) tablet 650 mg (650 mg Oral Given 09/08/22 1427)    Or  acetaminophen (TYLENOL) suppository 650 mg ( Rectal See Alternative 09/08/22 1427)  traZODone (DESYREL) tablet 25 mg (25 mg Oral Given 09/07/22 2313)  magnesium hydroxide (MILK OF MAGNESIA) suspension 30 mL (30 mLs Oral Given 09/08/22 0741)  ondansetron (ZOFRAN) tablet 4 mg (has no administration in time range)    Or  ondansetron (ZOFRAN) injection 4 mg (has no administration in time range)  magnesium oxide (MAG-OX) tablet 200 mg (200 mg Oral Given 09/07/22 2311)  insulin aspart (novoLOG) injection 0-9 Units ( Subcutaneous Not Given 09/08/22 1729)  torsemide (DEMADEX) tablet 40 mg (has no administration in time range)  furosemide (LASIX) injection 60 mg (60 mg Intravenous Given 09/07/22 2034)  sodium chloride 0.9 % bolus 250 mL (0 mLs Intravenous Stopped 09/08/22 0210)    Mobility I have not personally walked patient but was told she can walk short distance with assistance.      Focused Assessments Neuro Assessment Handoff:  Swallow screen pass? Yes  Cardiac Rhythm: Atrial fibrillation       Neuro Assessment: Within Defined Limits Neuro Checks:      Has TPA been given? No If patient is a Neuro Trauma and patient is going to OR before floor call report to [facility] nurse: [phone] or [phone]   R Recommendations: See  Admitting Provider Note  Report given to:   Additional Notes: able to eat on her own, sweet lady, loves to talk a lot

## 2022-09-09 ENCOUNTER — Observation Stay (HOSPITAL_COMMUNITY): Payer: Medicare Other

## 2022-09-09 DIAGNOSIS — I10 Essential (primary) hypertension: Secondary | ICD-10-CM | POA: Diagnosis not present

## 2022-09-09 DIAGNOSIS — E785 Hyperlipidemia, unspecified: Secondary | ICD-10-CM | POA: Diagnosis not present

## 2022-09-09 DIAGNOSIS — I5023 Acute on chronic systolic (congestive) heart failure: Secondary | ICD-10-CM | POA: Diagnosis not present

## 2022-09-09 DIAGNOSIS — I480 Paroxysmal atrial fibrillation: Secondary | ICD-10-CM | POA: Diagnosis not present

## 2022-09-09 DIAGNOSIS — E1122 Type 2 diabetes mellitus with diabetic chronic kidney disease: Secondary | ICD-10-CM

## 2022-09-09 LAB — BASIC METABOLIC PANEL
Anion gap: 10 (ref 5–15)
BUN: 50 mg/dL — ABNORMAL HIGH (ref 8–23)
CO2: 22 mmol/L (ref 22–32)
Calcium: 9 mg/dL (ref 8.9–10.3)
Chloride: 103 mmol/L (ref 98–111)
Creatinine, Ser: 1.38 mg/dL — ABNORMAL HIGH (ref 0.44–1.00)
GFR, Estimated: 36 mL/min — ABNORMAL LOW (ref >60)
Glucose, Bld: 151 mg/dL — ABNORMAL HIGH (ref 70–99)
Potassium: 3.9 mmol/L (ref 3.5–5.1)
Sodium: 135 mmol/L (ref 135–145)

## 2022-09-09 LAB — MAGNESIUM: Magnesium: 2.8 mg/dL — ABNORMAL HIGH (ref 1.7–2.4)

## 2022-09-09 MED ORDER — METOPROLOL SUCCINATE ER 50 MG PO TB24: 50.0000 mg | ORAL_TABLET | Freq: Every day | ORAL | 0 refills | Status: DC

## 2022-09-09 MED ORDER — TORSEMIDE 20 MG PO TABS: 40.0000 mg | ORAL_TABLET | Freq: Every day | ORAL | 1 refills | Status: DC

## 2022-09-09 NOTE — Progress Notes (Addendum)
Rounding Note    Patient Name: Tara Fields Date of Encounter: 09/09/2022  [place] Cardiologist: Lauree Chandler, MD   Subjective   Patient feeling much better today. Breathing has improved. No chest pain. Eager to go home   Inpatient Medications    Scheduled Meds:  apixaban  5 mg Oral BID   atorvastatin  40 mg Oral Daily   cholecalciferol  1,000 Units Oral BID   cyanocobalamin  1,000 mcg Oral Daily   ferrous gluconate  324 mg Oral Q breakfast   insulin aspart  0-9 Units Subcutaneous TID WC   magnesium oxide  200 mg Oral QPM   multivitamin with minerals  1 tablet Oral BID   terazosin  10 mg Oral QHS   torsemide  40 mg Oral Daily   Continuous Infusions:  PRN Meds: acetaminophen **OR** acetaminophen, magnesium hydroxide, melatonin, ondansetron **OR** ondansetron (ZOFRAN) IV, psyllium, traZODone   Vital Signs    Vitals:   09/08/22 2100 09/08/22 2200 09/08/22 2215 09/09/22 0401  BP: 110/69  122/60 (!) 106/57  Pulse:   80 87  Resp: 17  20 19   Temp:   98.5 F (36.9 C) (!) 97.5 F (36.4 C)  TempSrc:   Oral Oral  SpO2: 96%  99% 100%  Weight:  61.6 kg  66.3 kg  Height:        Intake/Output Summary (Last 24 hours) at 09/09/2022 0842 Last data filed at 09/09/2022 0600 Gross per 24 hour  Intake 240 ml  Output 550 ml  Net -310 ml      09/09/2022    4:01 AM 09/08/2022   10:00 PM 09/07/2022    5:27 PM  Last 3 Weights  Weight (lbs) 146 lb 2.6 oz 135 lb 12.9 oz 140 lb  Weight (kg) 66.3 kg 61.6 kg 63.504 kg      Telemetry    Atrial fibrillation, HR in the 80s-90s - Personally Reviewed  ECG    No new tracings since 1/23 - Personally Reviewed  Physical Exam   GEN: Elderly female. Sitting upright in the bed in no acute distress. Wearing [place]  Neck: No JVD Cardiac: Irregular rate and rhythm. Faint systolic murmur at RUSB. No rubs, or gallops.  Respiratory: Clear to auscultation bilaterally. Normal WOB on [place]  GI: Soft, nontender,  non-distended  MS: No edema in BLE; No deformity. Both lower legs are tender to palpation  Neuro:  Nonfocal  Psych: Normal affect   Labs    High Sensitivity Troponin:   Recent Labs  Lab 09/07/22 1743 09/07/22 1955 09/07/22 2233 09/08/22 0033  TROPONINIHS 14 15 19* 20*     Chemistry Recent Labs  Lab 09/07/22 1743 09/08/22 0610 09/09/22 0336  NA 136 136 135  K 4.8 3.9 3.9  CL 102 102 103  CO2 19* 24 22  GLUCOSE 142* 132* 151*  BUN 62* 54* 50*  CREATININE 1.45* 1.23* 1.38*  CALCIUM 9.2 8.8* 9.0  MG  --   --  2.8*  PROT 6.7  --   --   ALBUMIN 3.8  --   --   AST 32  --   --   ALT 17  --   --   ALKPHOS 86  --   --   BILITOT 0.7  --   --   GFRNONAA 34* 41* 36*  ANIONGAP 15 10 10     Lipids No results for input(s): "CHOL", "TRIG", "HDL", "LABVLDL", "LDLCALC", "CHOLHDL" in the last 168 hours.  Hematology Recent  Labs  Lab 09/07/22 1743 09/08/22 0610  WBC 7.2 5.3  RBC 4.21 3.92  HGB 13.1 12.3  HCT 40.4 36.9  MCV 96.0 94.1  MCH 31.1 31.4  MCHC 32.4 33.3  RDW 16.1* 16.4*  PLT 193 173   Thyroid No results for input(s): "TSH", "FREET4" in the last 168 hours.  BNP Recent Labs  Lab 09/07/22 1744  BNP 1,839.2*    DDimer No results for input(s): "DDIMER" in the last 168 hours.   Radiology    DG Chest Port 1 View  Result Date: 09/07/2022 CLINICAL DATA:  89 year old female presenting with dyspnea. EXAM: PORTABLE CHEST 1 VIEW COMPARISON:  April 23, 2022. FINDINGS: EKG leads project over the chest. Marked cardiomegaly as on previous imaging. Post trans arterial aortic valve replacement as on previous imaging. Aortic atherosclerosis. Hilar structures are stable. No lobar consolidation.  No gross pulmonary edema or pneumothorax. On limited assessment no acute skeletal process. IMPRESSION: 1. Marked cardiomegaly as on previous imaging. 2. Post trans arterial aortic valve replacement. Electronically Signed   By: Zetta Bills M.D.   On: 09/07/2022 18:08    Cardiac  Studies   Echocardiogram Pending 09/19/2022  Patient Profile     89 y.o. female ith a hx of severe AS s/p TAVR at [place], CAD, persistent afib, bilateral carotid artery stenosis, COPD, DM type II, GERD, hypertension, dyslipidemia, tobacco use who is being seen for the evaluation of acute on chronic systolic heart failure   Assessment & Plan    Acute on Chronic Systolic Heart Failure - Most recent echocardiogram from 10/2021 showed EF 20-25%, global hypokinesis, normal RV systolic function - Now, patient presented complaining of exertional dyspnea, lower chest/epigastric pain, and general malaise. BNP elevated to 1839.2 CXR with marked cardiomegaly (as on previous imaging) - Patient has received a dose of IV lasix 60 mg, and a dose of IV lasix 40 mg since presenting to the hospital. IV lasix sopped, and patient is now back on home torsemide 40 mg daily - Euvolemic on exam  - BP has been soft this admission. Patient also complained of an episode of orthostatic dizziness/near syncope that occurred a few days prior to presentation. Home metoprolol, spironolactone on hold. BP improving   Chronic Atrial Fibrillation  - Continue eliquis 5 mg BID (note, while age is 40, weight is 66kg and creatinine is less than 1.5)  - Home metoprolol has been held due to soft BP. If BP continues to increase, can consider resuming a low dose   Severe AS s/p TAVR  - Echo in 10/2021 with mean gradient 6.90mmHg, peak gradient 12.65mmHg.  - Given age, no need for serial echocardiograms   Carotid Artery Disease - Patient has a history of bilateral carotid artery stenosis. No stents placed per patient  - Given advanced age, no plans for serial surveillance   For questions or updates, please contact [place] Please consult [email or web address] for contact info under        Signed, Margie Billet, PA-C  Sep 19, 2022, 8:42 AM    I have personally seen and examined this patient. I agree with the assessment and  plan as outlined above.  Doing well. No volume overload. Reduce Toprol to 50 mg daily. Resume aldactone. OK to d/c home today.   Lauree Chandler, MD, [facility] 09/19/22 9:19 AM

## 2022-09-09 NOTE — Progress Notes (Addendum)
Received patient from ER, alert and oriented to person and place only. Reoriented the patient. Safety provided. Call bell within reach. Notified J. Howerter, DO. Plan of care ongoing.

## 2022-09-09 NOTE — Discharge Summary (Addendum)
Physician Discharge Summary   Patient: Tara Fields MRN: [id number] DOB: 1928-02-10  Admit date:     09/07/2022  Discharge date: 09/09/22  Discharge Physician: Jimmy Picket Magdelene Ruark   PCP: [name], NP   Recommendations at discharge:    Patient will continue diuresis with torsemide 40 mg daily, increase to bid in case of volume overload, 2 to 3 lbs weight gain in 24 hrs or 5 lbs weight gain in 7 days.  Decrease metoprolol succinate to 50 mg po daily.  Out patient echocardiogram. Follow up with [name] in 7 to 10 days. Follow up with Cardiology as scheduled.  Follow up renal function and electrolytes in 7 days.   Discharge Diagnoses: Principal Problem:   Acute on chronic systolic (congestive) heart failure (HCC) Active Problems:   Paroxysmal atrial fibrillation with RVR (HCC)   Essential hypertension   Dyslipidemia   Type 2 diabetes mellitus with chronic kidney disease, without long-term current use of insulin (HCC)   Vitamin B12 deficiency  Resolved Problems:   * No resolved hospital problems. [facility] Course: Tara Fields was admitted to the hospital with the working diagnosis of decompensated heart failure.   89 yo female with the past medical history of coronary artery disease, COPD, hypertension, and T2DM who presented with chest pressure, dyspnea and near syncope. Patient had increase dyspnea and weakness after a outpatient exercise session, with increase 02 requirements up to 4 L per. Because worsening symptoms she presented to the ED. On her initial physical examination her blood pressure was 116/87, HR 87, RR 20 and 02 saturation 100%, lungs with decreased breath sounds, and bibasilar rales, heart with S1 and S2 present and rhythmic with no gallops, abdomen with no distention, no lower extremity edema.   Na 136, K 4,8 CL 102 bicarbonate 19 glucose 142, bun 62 and cr 1,45 BNP 1,839 High sensitive troponin 14 and 15  Wbc 7,2 hgb 13.1 plt 193   Sars covid 19 negative, influenza negative   Urine analysis SG 1,011, negative protein, negative leukocytes.   Chest radiograph with cardiomegaly, bilateral interstitial infiltrates with cephalization of the vasculature, no effusions.   EKG 100 bpm, right axis deviation, qtc 571, interventricular conduction delay, atrial fibrillation rhythm with poor R R wave progression, with no significant ST segment or T wave changes.   Patient received IV furosemide with improvement of her symptoms.  Plan to follow up as outpatient with cardiology.    Assessment and Plan: * Acute on chronic systolic (congestive) heart failure (HCC) Echocardiogram with reduced LV systolic function to 20 to 25%, with global hypokinesis. LV internal cavity with sever dilatation.  RV with normal function. LA and RA with severe dilatation. PS TAVR.   Patient received IV furosemide with improvement in her volume status along with her symptoms.   Acute hypoxemic respiratory failure due to acute cardiogenic pulmonary edema.   At the time of her discharge will continue taking metoprolol (50 mg succinate daily), along with spironolactone and diuresis with torsemide.  Increase torsemide in case of volume overload. No SGLT 2 inh due to risk of recurrent urinary tract infections.   Follow up echocardiogram as outpatient.   Acute respiratory failure with hypoxia (HCC) - This is clearly secondary to #1. - O2 protocol will be followed. - Management otherwise as above.  Paroxysmal atrial fibrillation with RVR ([place]) Patient remained in atrial fibrillation, plan to continue metoprolol succinate one tablet daily of 50 mg Continue anticoagulation with apixaban.   Stage 3b  chronic kidney disease (CKD) ([place]) Renal function has been stable at the time of her discharge her serum cr is 1,38, with K at 3,9 and serum bicarbonate at 22. Plan to continue torsemide 40 mg daily and follow up renal function as out patient.   Essential  hypertension Continue blood pressure control with metoprolol and spironolactone.   Dyslipidemia Continue with atorvastatin.   Type 2 diabetes mellitus with chronic kidney disease, without long-term current use of insulin (HCC) Her glucose remained stable, she was placed on insulin sliding scale during her hospitalization.   Vitamin B12 deficiency - We will continue vitamin B12.         Consultants: cardiology  Procedures performed: none   Disposition: Home Diet recommendation:  Cardiac and Carb modified diet DISCHARGE MEDICATION: Allergies as of 09/09/2022       Reactions   Amlodipine Besy-benazepril Hcl Cough   Hctz [hydrochlorothiazide] Other (See Comments)   Per family, every diuretic the patient has been on flushes out her out and ultimately results in CRAMPING/JAUNDICE (added potassium might help)   Other Other (See Comments)   History of diverticulitis- CANNOT TOLERATE NUTS, CORN, AND ANY FOODS NOT EASILY DIGESTED- the patient AVOIDS these   Cyclobenzaprine Other (See Comments)   Confusion- does not tolerate well    Diclofenac Sodium Diarrhea   Hydralazine Other (See Comments)   Per family, every diuretic the patient has been on flushes out her out and ultimately results in CRAMPING/JAUNDICE (added potassium might help)   Methylprednisolone Other (See Comments)   Medrol/CORTICOSTEROIDS = A LOT OF ENERGY Delusions    Oxycodone Other (See Comments)   Patient becomes very disorientated         Medication List     TAKE these medications    acetaminophen 500 MG tablet Commonly known as: TYLENOL Take 500 mg by mouth 3 (three) times daily as needed for moderate pain. Take with Tramadol   atorvastatin 40 MG tablet Commonly known as: LIPITOR TAKE 1 TABLET BY MOUTH DAILY AT 6 PM. What changed: when to take this   cephALEXin 250 MG capsule Commonly known as: Keflex Take 1 capsule (250 mg total) by mouth daily.   cholecalciferol 25 MCG (1000 UNIT)  tablet Commonly known as: VITAMIN D3 Take 1,000 Units by mouth 2 (two) times daily.   Co Q-10 300 MG Caps Take 300 mg by mouth in the morning.   cyanocobalamin 1000 MCG tablet Commonly known as: VITAMIN B12 Take 1 tablet (1,000 mcg total) by mouth daily.   Eliquis 5 MG Tabs tablet Generic drug: apixaban TAKE 1 TABLET BY MOUTH TWICE A DAY   Ferrous Gluconate 324 (37.5 Fe) MG Tabs Take 1 tablet (324 mg total) by mouth daily with breakfast. What changed: additional instructions   Lidocaine-Menthol (Spray) 4-1 % Liqd Apply 1 spray topically daily as needed (back/knee pain/restless legs).   Magnesium 200 MG Tabs Take 200 mg by mouth every evening.   metoprolol succinate 50 MG 24 hr tablet Commonly known as: TOPROL-XL Take 1 tablet (50 mg total) by mouth daily. Take with or immediately following a meal. What changed: See the new instructions.   multivitamin with minerals Tabs tablet Take 1 tablet by mouth daily. Centrum Minis   potassium chloride 20 MEQ/15ML (10%) Soln Take 15 mLs (20 mEq total) by mouth daily. What changed: additional instructions   psyllium 58.6 % powder Commonly known as: METAMUCIL Take 1 packet by mouth daily as needed (for constipation).   spironolactone 25 MG tablet  Commonly known as: ALDACTONE TAKE 1/2 TABLET BY MOUTH DAILY   terazosin 10 MG capsule Commonly known as: HYTRIN TAKE 1 CAPSULE BY MOUTH AT BEDTIME.   torsemide 20 MG tablet Commonly known as: DEMADEX Take 2 tablets (40 mg total) by mouth daily. Incase of weight gain 2 to 3 lbs in 24 hrs or 5 lbs in 7 days, take 2 tablets twice a day until weight back to baseline. What changed: additional instructions   traMADol 50 MG tablet Commonly known as: ULTRAM Take 50 mg by mouth 3 (three) times daily as needed for moderate pain.        Discharge Exam: Filed Weights   09/07/22 1727 09/08/22 2200 09/09/22 0401  Weight: 63.5 kg 61.6 kg 66.3 kg   BP (!) 106/57 (BP Location: Right Arm)    Pulse 87   Temp (!) 97.5 F (36.4 C) (Oral)   Resp 19   Ht 5' (1.524 m)   Wt 66.3 kg   SpO2 100%   BMI 28.55 kg/m   Patient with improvement in her symptoms, today has feet pain that is not new for her, her son is at the bedside.   Neurology awake and alert ENT with no pallor Cardiovascular with S1 and S2 present irregularly irregular with no gallops, rubs. Systolic murmur at the apex. Respiratory with no rales or wheezing, Abdomen with no distention No lower extremity edema   Condition at discharge: stable  The results of significant diagnostics from this hospitalization (including imaging, microbiology, ancillary and laboratory) are listed below for reference.   Imaging Studies: DG Chest Port 1 View  Result Date: 09/07/2022 CLINICAL DATA:  89 year old female presenting with dyspnea. EXAM: PORTABLE CHEST 1 VIEW COMPARISON:  April 23, 2022. FINDINGS: EKG leads project over the chest. Marked cardiomegaly as on previous imaging. Post trans arterial aortic valve replacement as on previous imaging. Aortic atherosclerosis. Hilar structures are stable. No lobar consolidation.  No gross pulmonary edema or pneumothorax. On limited assessment no acute skeletal process. IMPRESSION: 1. Marked cardiomegaly as on previous imaging. 2. Post trans arterial aortic valve replacement. Electronically Signed   By: Zetta Bills M.D.   On: 09/07/2022 18:08    Microbiology: Results for orders placed or performed during the hospital encounter of 09/07/22  Resp panel by RT-PCR (RSV, Flu A&B, Covid) Anterior Nasal Swab     Status: None   Collection Time: 09/07/22  5:44 PM   Specimen: Anterior Nasal Swab  Result Value Ref Range Status   SARS Coronavirus 2 by RT PCR NEGATIVE NEGATIVE Final    Comment: (NOTE) SARS-CoV-2 target nucleic acids are NOT DETECTED.  The SARS-CoV-2 RNA is generally detectable in upper respiratory specimens during the acute phase of infection. The lowest concentration of  SARS-CoV-2 viral copies this assay can detect is 138 copies/mL. A negative result does not preclude SARS-Cov-2 infection and should not be used as the sole basis for treatment or other patient management decisions. A negative result may occur with  improper specimen collection/handling, submission of specimen other than nasopharyngeal swab, presence of viral mutation(s) within the areas targeted by this assay, and inadequate number of viral copies(<138 copies/mL). A negative result must be combined with clinical observations, patient history, and epidemiological information. The expected result is Negative.  Fact Sheet for Patients:  EntrepreneurPulse.com.au  Fact Sheet for Healthcare Providers:  IncredibleEmployment.be  This test is no t yet approved or cleared by the [place] FDA and  has been authorized for detection and/or diagnosis of  SARS-CoV-2 by FDA under an Emergency Use Authorization (EUA). This EUA will remain  in effect (meaning this test can be used) for the duration of the COVID-19 declaration under Section 564(b)(1) of the Act, 21 U.S.C.section 360bbb-3(b)(1), unless the authorization is terminated  or revoked sooner.       Influenza A by PCR NEGATIVE NEGATIVE Final   Influenza B by PCR NEGATIVE NEGATIVE Final    Comment: (NOTE) The Xpert Xpress SARS-CoV-2/FLU/RSV plus assay is intended as an aid in the diagnosis of influenza from Nasopharyngeal swab specimens and should not be used as a sole basis for treatment. Nasal washings and aspirates are unacceptable for Xpert Xpress SARS-CoV-2/FLU/RSV testing.  Fact Sheet for Patients: EntrepreneurPulse.com.au  Fact Sheet for Healthcare Providers: IncredibleEmployment.be  This test is not yet approved or cleared by the [place] FDA and has been authorized for detection and/or diagnosis of SARS-CoV-2 by FDA under an Emergency Use  Authorization (EUA). This EUA will remain in effect (meaning this test can be used) for the duration of the COVID-19 declaration under Section 564(b)(1) of the Act, 21 U.S.C. section 360bbb-3(b)(1), unless the authorization is terminated or revoked.     Resp Syncytial Virus by PCR NEGATIVE NEGATIVE Final    Comment: (NOTE) Fact Sheet for Patients: EntrepreneurPulse.com.au  Fact Sheet for Healthcare Providers: IncredibleEmployment.be  This test is not yet approved or cleared by the [place] FDA and has been authorized for detection and/or diagnosis of SARS-CoV-2 by FDA under an Emergency Use Authorization (EUA). This EUA will remain in effect (meaning this test can be used) for the duration of the COVID-19 declaration under Section 564(b)(1) of the Act, 21 U.S.C. section 360bbb-3(b)(1), unless the authorization is terminated or revoked.  Performed at [facility], [place] [address], [place], [place] [address]     Labs: CBC: Recent Labs  Lab 09/07/22 1743 09/08/22 0610  WBC 7.2 5.3  HGB 13.1 12.3  HCT 40.4 36.9  MCV 96.0 94.1  PLT 193 224   Basic Metabolic Panel: Recent Labs  Lab 09/07/22 1743 09/08/22 0610 09/09/22 0336  NA 136 136 135  K 4.8 3.9 3.9  CL 102 102 103  CO2 19* 24 22  GLUCOSE 142* 132* 151*  BUN 62* 54* 50*  CREATININE 1.45* 1.23* 1.38*  CALCIUM 9.2 8.8* 9.0  MG  --   --  2.8*   Liver Function Tests: Recent Labs  Lab 09/07/22 1743  AST 32  ALT 17  ALKPHOS 86  BILITOT 0.7  PROT 6.7  ALBUMIN 3.8   CBG: Recent Labs  Lab 09/08/22 0748 09/08/22 1215 09/08/22 1710 09/08/22 2221  GLUCAP 159* 191* 108* 116*    Discharge time spent: greater than 30 minutes.  Signed: Tawni Millers, MD [facility] 09/09/2022

## 2022-09-09 NOTE — Progress Notes (Signed)
0645H, patient complained of generalized pain ( arms, legs and abdominal pain due to constipation?). Patient refused Tylenol. Spoke to [place] (son) over the phone. Patient insisted to wait for Louie Casa at [facility] before she will take Tylenol.

## 2022-09-09 NOTE — Progress Notes (Signed)
Mobility Specialist - Progress Note   09/09/22 1112  Mobility  Activity Ambulated with assistance in room  Level of Assistance Moderate assist, patient does 50-74%  Assistive Device None  Distance Ambulated (ft) 10 ft  Activity Response Tolerated well  Mobility Referral Yes  $Mobility charge 1 Mobility   Pt received in room setting off call bell. Pt needed assistance to sink to get ready for discharge. Pt was ModA throughout. Pt was left EOB with all needs met and family present.   Tara Fields  Mobility Specialist Please contact via Solicitor or Rehab office at [phone]

## 2022-09-09 NOTE — Assessment & Plan Note (Signed)
Renal function has been stable at the time of her discharge her serum cr is 1,38, with K at 3,9 and serum bicarbonate at 22. Plan to continue torsemide 40 mg daily and follow up renal function as out patient.

## 2022-09-09 NOTE — Progress Notes (Signed)
Per Louie Casa (patient's son), patient had intermittent confusion even before going to the hospital. Patient was upset regarding the questions asked during orientation assessment. Education given. No further concern from [place].

## 2022-09-09 NOTE — Progress Notes (Signed)
Heart Failure Navigator Progress Note  Assessed for Heart & Vascular TOC clinic readiness.  Patient has a close [place] follow up on 10/01/2022.   Navigator available for reassessment of patient.   Earnestine Leys, BSN, Clinical cytogeneticist Only

## 2022-09-09 NOTE — Plan of Care (Signed)
Problem: Education: Goal: Ability to demonstrate management of disease process will improve 09/09/2022 1111 by Marshell Rieger, Jerilynn Mages, RN Outcome: Adequate for Discharge 09/09/2022 1111 by Levell July, RN Outcome: Adequate for Discharge Goal: Ability to verbalize understanding of medication therapies will improve 09/09/2022 1111 by Johntae Broxterman, Jerilynn Mages, RN Outcome: Adequate for Discharge 09/09/2022 1111 by Levell July, RN Outcome: Adequate for Discharge Goal: Individualized Educational Video(s) 09/09/2022 1111 by Levell July, RN Outcome: Adequate for Discharge 09/09/2022 1111 by Levell July, RN Outcome: Adequate for Discharge   Problem: Activity: Goal: Capacity to carry out activities will improve 09/09/2022 1111 by Wrenna Saks, Jerilynn Mages, RN Outcome: Adequate for Discharge 09/09/2022 1111 by Levell July, RN Outcome: Adequate for Discharge   Problem: Cardiac: Goal: Ability to achieve and maintain adequate cardiopulmonary perfusion will improve 09/09/2022 1111 by Lacara Dunsworth, Jerilynn Mages, RN Outcome: Adequate for Discharge 09/09/2022 1111 by Levell July, RN Outcome: Adequate for Discharge   Problem: Education: Goal: Ability to describe self-care measures that may prevent or decrease complications (Diabetes Survival Skills Education) will improve 09/09/2022 1111 by Levell July, RN Outcome: Adequate for Discharge 09/09/2022 1111 by Levell July, RN Outcome: Adequate for Discharge Goal: Individualized Educational Video(s) 09/09/2022 1111 by Levell July, RN Outcome: Adequate for Discharge 09/09/2022 1111 by Levell July, RN Outcome: Adequate for Discharge   Problem: Coping: Goal: Ability to adjust to condition or change in health will improve 09/09/2022 1111 by Levell July, RN Outcome: Adequate for Discharge 09/09/2022 1111 by Levell July, RN Outcome: Adequate for Discharge   Problem: Fluid Volume: Goal:  Ability to maintain a balanced intake and output will improve 09/09/2022 1111 by Levell July, RN Outcome: Adequate for Discharge 09/09/2022 1111 by Levell July, RN Outcome: Adequate for Discharge   Problem: Health Behavior/Discharge Planning: Goal: Ability to identify and utilize available resources and services will improve 09/09/2022 1111 by Dammon Makarewicz, Jerilynn Mages, RN Outcome: Adequate for Discharge 09/09/2022 1111 by Levell July, RN Outcome: Adequate for Discharge Goal: Ability to manage health-related needs will improve 09/09/2022 1111 by Margueritte Guthridge, Jerilynn Mages, RN Outcome: Adequate for Discharge 09/09/2022 1111 by Levell July, RN Outcome: Adequate for Discharge   Problem: Metabolic: Goal: Ability to maintain appropriate glucose levels will improve 09/09/2022 1111 by Levell July, RN Outcome: Adequate for Discharge 09/09/2022 1111 by Levell July, RN Outcome: Adequate for Discharge   Problem: Nutritional: Goal: Maintenance of adequate nutrition will improve 09/09/2022 1111 by Levell July, RN Outcome: Adequate for Discharge 09/09/2022 1111 by Levell July, RN Outcome: Adequate for Discharge Goal: Progress toward achieving an optimal weight will improve 09/09/2022 1111 by Levell July, RN Outcome: Adequate for Discharge 09/09/2022 1111 by Levell July, RN Outcome: Adequate for Discharge   Problem: Skin Integrity: Goal: Risk for impaired skin integrity will decrease 09/09/2022 1111 by Levell July, RN Outcome: Adequate for Discharge 09/09/2022 1111 by Levell July, RN Outcome: Adequate for Discharge   Problem: Tissue Perfusion: Goal: Adequacy of tissue perfusion will improve 09/09/2022 1111 by Levell July, RN Outcome: Adequate for Discharge 09/09/2022 1111 by Levell July, RN Outcome: Adequate for Discharge   Problem: Education: Goal: Knowledge of General Education information will  improve Description: Including pain rating scale, medication(s)/side effects and non-pharmacologic comfort measures 09/09/2022 1111 by Levell July, RN Outcome: Adequate for Discharge 09/09/2022 1111 by Levell July, RN Outcome: Adequate for Discharge   Problem: Health Behavior/Discharge Planning:  Goal: Ability to manage health-related needs will improve 09/09/2022 1111 by Kerina Simoneau, Jerilynn Mages, RN Outcome: Adequate for Discharge 09/09/2022 1111 by Levell July, RN Outcome: Adequate for Discharge   Problem: Clinical Measurements: Goal: Ability to maintain clinical measurements within normal limits will improve 09/09/2022 1111 by Hermes Wafer, Jerilynn Mages, RN Outcome: Adequate for Discharge 09/09/2022 1111 by Levell July, RN Outcome: Adequate for Discharge Goal: Will remain free from infection 09/09/2022 1111 by Kaaliyah Kita, Jerilynn Mages, RN Outcome: Adequate for Discharge 09/09/2022 1111 by Levell July, RN Outcome: Adequate for Discharge Goal: Diagnostic test results will improve 09/09/2022 1111 by Lounette Sloan, Jerilynn Mages, RN Outcome: Adequate for Discharge 09/09/2022 1111 by Levell July, RN Outcome: Adequate for Discharge Goal: Respiratory complications will improve 09/09/2022 1111 by Levell July, RN Outcome: Adequate for Discharge 09/09/2022 1111 by Levell July, RN Outcome: Adequate for Discharge Goal: Cardiovascular complication will be avoided 09/09/2022 1111 by Levell July, RN Outcome: Adequate for Discharge 09/09/2022 1111 by Levell July, RN Outcome: Adequate for Discharge   Problem: Activity: Goal: Risk for activity intolerance will decrease 09/09/2022 1111 by Ajax Schroll, Jerilynn Mages, RN Outcome: Adequate for Discharge 09/09/2022 1111 by Levell July, RN Outcome: Adequate for Discharge   Problem: Nutrition: Goal: Adequate nutrition will be maintained 09/09/2022 1111 by Levell July, RN Outcome: Adequate for  Discharge 09/09/2022 1111 by Levell July, RN Outcome: Adequate for Discharge   Problem: Coping: Goal: Level of anxiety will decrease 09/09/2022 1111 by Levell July, RN Outcome: Adequate for Discharge 09/09/2022 1111 by Levell July, RN Outcome: Adequate for Discharge   Problem: Elimination: Goal: Will not experience complications related to bowel motility 09/09/2022 1111 by Levell July, RN Outcome: Adequate for Discharge 09/09/2022 1111 by Levell July, RN Outcome: Adequate for Discharge Goal: Will not experience complications related to urinary retention 09/09/2022 1111 by Levell July, RN Outcome: Adequate for Discharge 09/09/2022 1111 by Levell July, RN Outcome: Adequate for Discharge   Problem: Pain Managment: Goal: General experience of comfort will improve 09/09/2022 1111 by Levell July, RN Outcome: Adequate for Discharge 09/09/2022 1111 by Levell July, RN Outcome: Adequate for Discharge   Problem: Safety: Goal: Ability to remain free from injury will improve 09/09/2022 1111 by Altan Kraai, Jerilynn Mages, RN Outcome: Adequate for Discharge 09/09/2022 1111 by Levell July, RN Outcome: Adequate for Discharge   Problem: Skin Integrity: Goal: Risk for impaired skin integrity will decrease 09/09/2022 1111 by Levell July, RN Outcome: Adequate for Discharge 09/09/2022 1111 by Levell July, RN Outcome: Adequate for Discharge

## 2022-09-14 ENCOUNTER — Encounter: Payer: Self-pay | Admitting: Adult Health

## 2022-09-14 ENCOUNTER — Ambulatory Visit (INDEPENDENT_AMBULATORY_CARE_PROVIDER_SITE_OTHER): Payer: Medicare Other | Admitting: Adult Health

## 2022-09-14 VITALS — BP 120/78 | HR 80 | Temp 97.1°F | Wt 138.6 lb

## 2022-09-14 DIAGNOSIS — E1122 Type 2 diabetes mellitus with diabetic chronic kidney disease: Secondary | ICD-10-CM

## 2022-09-14 DIAGNOSIS — E538 Deficiency of other specified B group vitamins: Secondary | ICD-10-CM

## 2022-09-14 DIAGNOSIS — R451 Restlessness and agitation: Secondary | ICD-10-CM

## 2022-09-14 DIAGNOSIS — I480 Paroxysmal atrial fibrillation: Secondary | ICD-10-CM

## 2022-09-14 DIAGNOSIS — I5043 Acute on chronic combined systolic (congestive) and diastolic (congestive) heart failure: Secondary | ICD-10-CM | POA: Diagnosis not present

## 2022-09-14 DIAGNOSIS — G4700 Insomnia, unspecified: Secondary | ICD-10-CM

## 2022-09-14 DIAGNOSIS — I10 Essential (primary) hypertension: Secondary | ICD-10-CM

## 2022-09-14 DIAGNOSIS — J9601 Acute respiratory failure with hypoxia: Secondary | ICD-10-CM

## 2022-09-14 DIAGNOSIS — N1832 Chronic kidney disease, stage 3b: Secondary | ICD-10-CM | POA: Diagnosis not present

## 2022-09-14 DIAGNOSIS — E785 Hyperlipidemia, unspecified: Secondary | ICD-10-CM

## 2022-09-14 LAB — BASIC METABOLIC PANEL
BUN: 56 mg/dL — ABNORMAL HIGH (ref 6–23)
CO2: 26 mEq/L (ref 19–32)
Calcium: 9.1 mg/dL (ref 8.4–10.5)
Chloride: 100 mEq/L (ref 96–112)
Creatinine, Ser: 1.17 mg/dL (ref 0.40–1.20)
GFR: 40.49 mL/min — ABNORMAL LOW (ref >60.00)
Glucose, Bld: 116 mg/dL — ABNORMAL HIGH (ref 70–99)
Potassium: 4.2 mEq/L (ref 3.5–5.1)
Sodium: 135 mEq/L (ref 135–145)

## 2022-09-14 MED ORDER — ARIPIPRAZOLE 10 MG PO TABS: 10.0000 mg | ORAL_TABLET | Freq: Every day | ORAL | 0 refills | Status: DC

## 2022-09-14 NOTE — Patient Instructions (Addendum)
I am going to check some blood work on you today.   I am also going to prescribe you abilify to help with sleep. Take this every night   Lets follow up in 30 days

## 2022-09-14 NOTE — Progress Notes (Signed)
Subjective:    Patient ID: Tara Fields, female    DOB: September 08, 1927, 89 y.o.   MRN: [id number]  HPI 89 year old female who  has a past medical history of Acute exacerbation of CHF (congestive heart failure) ([place]) (12/18/2020), AKI (acute kidney injury) ([place]), Aortic stenosis, CAD (coronary artery disease), COPD (chronic obstructive pulmonary disease) ([place]), Diabetes mellitus ([place]), Diverticulitis, GERD (gastroesophageal reflux disease), HTN (hypertension), Hyperlipidemia, Legally blind in left eye, as defined in [place], Tobacco abuse, and UTI (urinary tract infection) (11/10/2021).  She presents to the office today for TCM visit   Admit Date 09/07/2022 Discharge Date 09/09/2022  She presented to the emergency room chest pressure, dyspnea, and near syncope.  She reported having increased dyspnea and weakness after an outpatient exercise session with increased O2 requirements up to 4 L per nasal cannula.  Due to her worsening symptoms she presented to the ED.  Hospital Course  Acute on Chronic Systolic HF  -Echocardiogram with reduced LV systolic function to 20 to 25% with global hypokinesis.  LV internal cavity with severe dilation.  RV with normal function.  LA and RA with severe dilation -Received IV sunlight with improvement in her volume status along with symptoms -She was advised upon discharge to continue taking metoprolol 50 mg daily, spironolactone, and diuresis with torsemide.  Can increase torsemide in case of volume overload.  Acute Respiratory failure with hypoxia - related to Acute on chronic CHF  PAF -And in A-fib and was continued on metoprolol succinate 50 mg daily.  She was continued with coagulation with apixaban   Stage 3b chronic kidney disease -Function stable at time of discharge with serum creatinine 1.38 and potassium at 3.9.  -Continue with torsemide 40 mg daily and follow-up renal function as outpatient  Hypertension  -Continue blood pressure control with  metoprolol and spironolactone  Dyslipidemia  - Managed with Atorvastatin   Type 2 DM with CKD - glucose remained stable. Was placed in sliding scale insulin during admission   Vitamin B 12 deficiency  - continued on Vitamin B12 supplement      Review of Systems  Constitutional: Negative.   Respiratory: Negative.    Cardiovascular: Negative.   Gastrointestinal: Negative.   Genitourinary: Negative.   All other systems reviewed and are negative.    Past Medical History:  Diagnosis Date   Acute exacerbation of CHF (congestive heart failure) ([place]) 12/18/2020   AKI (acute kidney injury) ([place])    Aortic stenosis    s/p TAVR 26 mm Edwards Sapien 3 THV July 2020   CAD (coronary artery disease)    COPD (chronic obstructive pulmonary disease) ([facility])    Diabetes mellitus ([facility])    Diverticulitis    GERD (gastroesophageal reflux disease)    HTN (hypertension)    Hyperlipidemia    Legally blind in left eye, as defined in [place]    Tobacco abuse    UTI (urinary tract infection) 11/10/2021    Social History   Socioeconomic History   Marital status: Widowed    Spouse name: Not on file   Number of children: 2   Years of education: Not on file   Highest education level: 12th grade  Occupational History   Occupation: Retired    Comment: homemaker  Tobacco Use   Smoking status: Former    Packs/day: 1.00    Years: 70.00    Total pack years: 70.00    Types: Cigarettes    Quit date: 12/15/2019    Years since quitting:  2.7   Smokeless tobacco: Never   Tobacco comments:    Pt states quiting a year ago. 01/03/20 [facility]J  Vaping Use   Vaping Use: Never used  Substance and Sexual Activity   Alcohol use: No    Alcohol/week: 0.0 standard drinks of alcohol   Drug use: No   Sexual activity: Not Currently  Other Topics Concern   Not on file  Social History Narrative   Widowed- was married for 29 years    Has two sons       Social Determinants of Health   Financial Resource Strain: Low  Risk  (08/14/2022)   Overall Financial Resource Strain (CARDIA)    Difficulty of Paying Living Expenses: Not hard at all  Food Insecurity: No Food Insecurity (09/09/2022)   Hunger Vital Sign    Worried About Running Out of Food in the Last Year: Never true    Ran Out of Food in the Last Year: Never true  Transportation Needs: No Transportation Needs (09/09/2022)   PRAPARE - Hydrologist (Medical): No    Lack of Transportation (Non-Medical): No  Physical Activity: Unknown (08/14/2022)   Exercise Vital Sign    Days of Exercise per Week: 0 days    Minutes of Exercise per Session: Not on file  Stress: No Stress Concern Present (08/14/2022)   [place]    Feeling of Stress : Only a little  Social Connections: Socially Isolated (08/14/2022)   Social Connection and Isolation Panel [NHANES]    Frequency of Communication with Friends and Family: More than three times a week    Frequency of Social Gatherings with Friends and Family: Three times a week    Attends Religious Services: Never    Active Member of Clubs or Organizations: No    Attends Archivist Meetings: Not on file    Marital Status: Widowed  Intimate Partner Violence: Not At Risk (09/09/2022)   Humiliation, Afraid, Rape, and Kick questionnaire    Fear of Current or Ex-Partner: No    Emotionally Abused: No    Physically Abused: No    Sexually Abused: No    Past Surgical History:  Procedure Laterality Date   ABDOMINAL HYSTERECTOMY     APPENDECTOMY     BACK SURGERY     CATARACT EXTRACTION, BILATERAL     CHOLECYSTECTOMY     COLONOSCOPY WITH PROPOFOL N/A 04/16/2021   Procedure: COLONOSCOPY WITH PROPOFOL;  Surgeon: Milus Banister, MD;  Location: [facility];  Service: Gastroenterology;  Laterality: N/A;   ESOPHAGOGASTRODUODENOSCOPY (EGD) WITH PROPOFOL N/A 04/15/2021   Procedure: ESOPHAGOGASTRODUODENOSCOPY (EGD) WITH PROPOFOL;   Surgeon: Doran Stabler, MD;  Location: [facility];  Service: Gastroenterology;  Laterality: N/A;    Family History  Problem Relation Age of Onset   Diabetes Mother    Stroke Brother    Cerebral palsy Brother    Coronary artery disease Neg Hx     Allergies  Allergen Reactions   Amlodipine Besy-Benazepril Hcl Cough   Hctz [Hydrochlorothiazide] Other (See Comments)    Per family, every diuretic the patient has been on flushes out her out and ultimately results in CRAMPING/JAUNDICE (added potassium might help)   Other Other (See Comments)    History of diverticulitis- CANNOT TOLERATE NUTS, CORN, AND ANY FOODS NOT EASILY DIGESTED- the patient AVOIDS these   Cyclobenzaprine Other (See Comments)    Confusion- does not tolerate well    Diclofenac  Sodium Diarrhea   Hydralazine Other (See Comments)    Per family, every diuretic the patient has been on flushes out her out and ultimately results in CRAMPING/JAUNDICE (added potassium might help)   Methylprednisolone Other (See Comments)    Medrol/CORTICOSTEROIDS = A LOT OF ENERGY Delusions    Oxycodone Other (See Comments)    Patient becomes very disorientated     Current Outpatient Medications on File Prior to Visit  Medication Sig Dispense Refill   acetaminophen (TYLENOL) 500 MG tablet Take 500 mg by mouth 3 (three) times daily as needed for moderate pain. Take with Tramadol     atorvastatin (LIPITOR) 40 MG tablet TAKE 1 TABLET BY MOUTH DAILY AT 6 PM. (Patient taking differently: Take 40 mg by mouth daily.) 90 tablet 1   cephALEXin (KEFLEX) 250 MG capsule Take 1 capsule (250 mg total) by mouth daily. 90 capsule 1   cholecalciferol (VITAMIN D3) 25 MCG (1000 UNIT) tablet Take 1,000 Units by mouth 2 (two) times daily.     Coenzyme Q10 (CO Q-10) 300 MG CAPS Take 300 mg by mouth in the morning.     ELIQUIS 5 MG TABS tablet TAKE 1 TABLET BY MOUTH TWICE A DAY 180 tablet 1   Ferrous Gluconate 324 (37.5 Fe) MG TABS Take 1 tablet (324 mg  total) by mouth daily with breakfast. (Patient taking differently: Take 324 mg by mouth daily with breakfast. Every other day) 90 tablet 3   Lidocaine-Menthol, Spray, 4-1 % LIQD Apply 1 spray topically daily as needed (back/knee pain/restless legs).     Magnesium 200 MG TABS Take 200 mg by mouth every evening.     metoprolol succinate (TOPROL-XL) 50 MG 24 hr tablet Take 1 tablet (50 mg total) by mouth daily. Take with or immediately following a meal. 30 tablet 0   Multiple Vitamin (MULTIVITAMIN WITH MINERALS) TABS tablet Take 1 tablet by mouth daily. Centrum Minis     potassium chloride 20 MEQ/15ML (10%) SOLN Take 15 mLs (20 mEq total) by mouth daily. (Patient taking differently: Take 20 mEq by mouth daily. 7 Mls daily) 1350 mL 3   psyllium (METAMUCIL) 58.6 % powder Take 1 packet by mouth daily as needed (for constipation).     spironolactone (ALDACTONE) 25 MG tablet TAKE 1/2 TABLET BY MOUTH DAILY 45 tablet 1   terazosin (HYTRIN) 10 MG capsule TAKE 1 CAPSULE BY MOUTH AT BEDTIME. 90 capsule 1   torsemide (DEMADEX) 20 MG tablet Take 2 tablets (40 mg total) by mouth daily. Incase of weight gain 2 to 3 lbs in 24 hrs or 5 lbs in 7 days, take 2 tablets twice a day until weight back to baseline. 180 tablet 1   traMADol (ULTRAM) 50 MG tablet Take 50 mg by mouth 3 (three) times daily as needed for moderate pain.     vitamin B-12 (CYANOCOBALAMIN) 1000 MCG tablet Take 1 tablet (1,000 mcg total) by mouth daily. 30 tablet 3   No current facility-administered medications on file prior to visit.    BP 120/78 (BP Location: Right Arm, Patient Position: Sitting, Cuff Size: Normal)   Pulse 80   Temp (!) 97.1 F (36.2 C) (Oral)   Wt 138 lb 9.6 oz (62.9 kg)   SpO2 97%   BMI 27.07 kg/m       Objective:   Physical Exam Vitals and nursing note reviewed.  Constitutional:      Appearance: Normal appearance.  Cardiovascular:     Rate and Rhythm: Normal rate. Rhythm  irregular.     Pulses: Normal pulses.      Heart sounds: Normal heart sounds.  Pulmonary:     Effort: Pulmonary effort is normal.     Breath sounds: Normal breath sounds.  Abdominal:     General: Abdomen is flat. Bowel sounds are normal.     Palpations: Abdomen is soft.  Musculoskeletal:        General: Normal range of motion.     Right lower leg: No edema.     Left lower leg: No edema.     Comments: Evolemic   Skin:    General: Skin is warm and dry.     Capillary Refill: Capillary refill takes less than 2 seconds.  Neurological:     General: No focal deficit present.     Mental Status: She is alert and oriented to person, place, and time.  Psychiatric:        Mood and Affect: Mood normal.        Behavior: Behavior normal.        Thought Content: Thought content normal.        Judgment: Judgment normal.        Assessment & Plan:  1. Acute on chronic combined systolic and diastolic CHF (congestive heart failure) ([place]) - Reviewed hospital note, discharge instructions labs, imaging, and medication changes - Basic Metabolic Panel; Future  2. Acute respiratory failure with hypoxia ([facility])  - Basic Metabolic Panel; Future  3. PAF (paroxysmal atrial fibrillation) ([facility]) - Afib today.  - Continue anticoagulation and beta blocker.  - Basic Metabolic Panel; Future  4. Stage 3b chronic kidney disease (CKD) ([place]) - Continue to monitor  - Basic Metabolic Panel; Future  5. Essential hypertension - well controlled.  - Basic Metabolic Panel; Future  6. Dyslipidemia  - Basic Metabolic Panel; Future  7. Type 2 diabetes mellitus with stage 3b chronic kidney disease, without long-term current use of insulin ([place])  - Basic Metabolic Panel; Future  8. Vitamin B12 deficiency  - Basic Metabolic Panel; Future  9. Agitation  - Will trial her on Abilify  - Her son will update me - ARIPiprazole (ABILIFY) 10 MG tablet; Take 1 tablet (10 mg total) by mouth at bedtime.  Dispense: 90 tablet; Refill: 0  10. Insomnia, unspecified  type - ARIPiprazole (ABILIFY) 10 MG tablet; Take 1 tablet (10 mg total) by mouth at bedtime.  Dispense: 90 tablet; Refill: 0  [name], NP

## 2022-09-16 ENCOUNTER — Other Ambulatory Visit: Payer: Self-pay | Admitting: Physician Assistant

## 2022-09-16 ENCOUNTER — Other Ambulatory Visit: Payer: Self-pay | Admitting: Adult Health

## 2022-09-16 DIAGNOSIS — I5043 Acute on chronic combined systolic (congestive) and diastolic (congestive) heart failure: Secondary | ICD-10-CM

## 2022-09-16 NOTE — Telephone Encounter (Signed)
Okay for refill?  

## 2022-09-20 ENCOUNTER — Other Ambulatory Visit: Payer: Self-pay | Admitting: Adult Health

## 2022-09-21 ENCOUNTER — Encounter: Payer: Self-pay | Admitting: Adult Health

## 2022-09-21 NOTE — Telephone Encounter (Signed)
Please advise 

## 2022-09-30 NOTE — Progress Notes (Signed)
Office Visit    Patient Name: Tara Fields Date of Encounter: 10/01/2022  PCP:  [name], NP   [facility]  Cardiologist:  Lauree Chandler, MD  Advanced Practice Provider:  No care team member to display Electrophysiologist:  None   HPI    Tara Fields is a 89 y.o. female with past medical history significant for severe AS status post TAVR at [place], COPD, CAD, GERD, persistent atrial fibrillation, DM, HTN, HLD, tobacco abuse and diverticulitis presents today for follow-up appointment.  She was seen remotely in 2016.  She reestablished with Dr. Angelena Form January 2022.  She underwent TAVR at [facility] 03/07/2019 with placement of a 26 mm SAPIEN ultra THV from the right femoral artery approach.  She developed a pseudoaneurysm post TAVR and had a thrombin injection.  She had atrial fibrillation and was on Eliquis.  Has a history of carotid artery disease.  She says no stent was placed in her carotid artery.  Cardiac catheterization March 2019 at [place] with 25% mid LAD stenosis, 50% second diagonal branch, 50% distal circumflex stenosis, 75% ostial stenosis of a small diagonal branch.  Echocardiogram September 2020 at [place] with LVEF 45 to 50%.  Moderate MR.  Bioprosthetic aortic valve working well.  Repeat echo at [place] July 2021 with no change, trivial perivalvular leak noted.  She was admitted to [facility] 12/19/2020 with acute systolic CHF.  Echo 12/20/2020 with LVEF 25 to 30%.  No ischemic evaluation was planned per discussion with Dr. Percival Spanish and the patient.  She was diuresed with IV Lasix.  Norvasc was stopped.  She was readmitted with CHF 2022 after being treated with IV Lasix.  She was doing well at the office visit August 2022 but readmitted September 2022 with CHF.  She was diuresed at home on furosemide  She was readmitted October 2022 with COVID and was diuresed.  Readmitted again January 2023 with rapid atrial fibrillation.  Rate controlled on IV  Cardizem.  Mild troponin elevation due to demand ischemia.  She was seen in the office February 2023 and was doing well.  She was then admitted to [facility] March 2023 with pneumonia and volume overload.  She was followed by advanced heart failure team and was rate controlled with IV amiodarone and diuresed with IV Lasix.  She was last seen 03/17/2022 by Dr. Angelena Form for follow-up.  Denied chest pain and dyspnea.  Denied palpitations, lower extremity edema, orthopnea, PND, dizziness, near-syncope/syncope.  She was recently in the hospital for a syncopal episode.  She states she did not lose consciousness.  She was standing at the sink and then had some lightheadedness.  She was able to sit down and felt better.  This occurred after her physical therapy session.  She did not eat well that morning.  And it had been after exercise.  We discussed updating an echocardiogram to check on her TAVR valve.  Her heart rate on the pulse ox was elevated and was reading 114.  I do think it was elevated perhaps in the 90s but not quite as high as 114 bpm.  We discussed putting her back on her original dose of metoprolol succinate which was 50 mg twice a day.  This was recently changed when she was in the hospital.  Encouraged to track her blood pressure at home.  We also discussed proper hydration and proper nutrition.  Reports no shortness of breath nor dyspnea on exertion. Reports no chest pain, pressure, or tightness. No edema,  orthopnea, PND. Reports no palpitations.   Past Medical History    Past Medical History:  Diagnosis Date   Acute exacerbation of CHF (congestive heart failure) ([place]) 12/18/2020   AKI (acute kidney injury) ([place])    Aortic stenosis    s/p TAVR 26 mm Edwards Sapien 3 THV July 2020   CAD (coronary artery disease)    COPD (chronic obstructive pulmonary disease) (HCC)    Diabetes mellitus (HCC)    Diverticulitis    GERD (gastroesophageal reflux disease)    HTN (hypertension)    Hyperlipidemia     Legally blind in left eye, as defined in [place]    Tobacco abuse    UTI (urinary tract infection) 11/10/2021   Past Surgical History:  Procedure Laterality Date   ABDOMINAL HYSTERECTOMY     APPENDECTOMY     BACK SURGERY     CATARACT EXTRACTION, BILATERAL     CHOLECYSTECTOMY     COLONOSCOPY WITH PROPOFOL N/A 04/16/2021   Procedure: COLONOSCOPY WITH PROPOFOL;  Surgeon: Milus Banister, MD;  Location: [facility];  Service: Gastroenterology;  Laterality: N/A;   ESOPHAGOGASTRODUODENOSCOPY (EGD) WITH PROPOFOL N/A 04/15/2021   Procedure: ESOPHAGOGASTRODUODENOSCOPY (EGD) WITH PROPOFOL;  Surgeon: Doran Stabler, MD;  Location: [facility];  Service: Gastroenterology;  Laterality: N/A;    Allergies  Allergies  Allergen Reactions   Amlodipine Besy-Benazepril Hcl Cough   Hctz [Hydrochlorothiazide] Other (See Comments)    Per family, every diuretic the patient has been on flushes out her out and ultimately results in CRAMPING/JAUNDICE (added potassium might help)   Other Other (See Comments)    History of diverticulitis- CANNOT TOLERATE NUTS, CORN, AND ANY FOODS NOT EASILY DIGESTED- the patient AVOIDS these   Cyclobenzaprine Other (See Comments)    Confusion- does not tolerate well    Diclofenac Sodium Diarrhea   Hydralazine Other (See Comments)    Per family, every diuretic the patient has been on flushes out her out and ultimately results in CRAMPING/JAUNDICE (added potassium might help)   Methylprednisolone Other (See Comments)    Medrol/CORTICOSTEROIDS = A LOT OF ENERGY Delusions    Oxycodone Other (See Comments)    Patient becomes very disorientated      EKGs/Labs/Other Studies Reviewed:   The following studies were reviewed today:  Echo 11/09/21:  1. S/P TAVR with mild perivalvular AI; no change compared to previous.   2. Left ventricular ejection fraction, by estimation, is 20 to 25%. The  left ventricle has severely decreased function. The left ventricle  demonstrates global  hypokinesis. The left ventricular internal cavity size  was severely dilated. Left ventricular  diastolic function could not be evaluated.   3. Right ventricular systolic function is normal. The right ventricular  size is normal. There is moderately elevated pulmonary artery systolic  pressure.   4. Left atrial size was severely dilated.   5. Right atrial size was severely dilated.   6. The mitral valve is abnormal. Mild mitral valve regurgitation. No  evidence of mitral stenosis.   7. The aortic valve has been repaired/replaced. Aortic valve  regurgitation is mild. No aortic stenosis is present. There is a 26 mm  Sapien prosthetic (TAVR) valve present in the aortic position. Procedure  EKG:  EKG is  ordered today.  The ekg ordered today demonstrates atrial fibrillation with left bundle branch block, rate 94 bpm  Recent Labs: 09/07/2022: ALT 17; B Natriuretic Peptide 1,839.2 09/08/2022: Hemoglobin 12.3; Platelets 173 09/09/2022: Magnesium 2.8 09/14/2022: BUN 56; Creatinine, Ser 1.17;  Potassium 4.2; Sodium 135  Recent Lipid Panel    Component Value Date/Time   CHOL 94 05/08/2021 0234   TRIG 57 05/08/2021 0234   HDL 34 (L) 05/08/2021 0234   CHOLHDL 2.8 05/08/2021 0234   VLDL 11 05/08/2021 0234   LDLCALC 49 05/08/2021 0234    Risk Assessment/Calculations:   CHA2DS2-VASc Score = 7   This indicates a 11.2% annual risk of stroke. The patient's score is based upon: CHF History: 1 HTN History: 1 Diabetes History: 1 Stroke History: 0 Vascular Disease History: 1 Age Score: 2 Gender Score: 1     Home Medications   No outpatient medications have been marked as taking for the 10/01/22 encounter (Office Visit) with Elgie Collard, PA-C.     Review of Systems      All other systems reviewed and are otherwise negative except as noted above.  Physical Exam    VS:  BP 116/74   Pulse (!) 114   Ht 5' (1.524 m)   Wt 140 lb 3.2 oz (63.6 kg)   SpO2 96%   BMI 27.38 kg/m  , BMI Body  mass index is 27.38 kg/m.  Wt Readings from Last 3 Encounters:  10/01/22 140 lb 3.2 oz (63.6 kg)  09/14/22 138 lb 9.6 oz (62.9 kg)  09/09/22 146 lb 2.6 oz (66.3 kg)     GEN: Well nourished, well developed, in no acute distress. HEENT: normal. Neck: Supple, no JVD, carotid bruits, or masses. Cardiac: irregularly irregular, no murmurs, rubs, or gallops. No clubbing, cyanosis, edema.  Radials/PT 2+ and equal bilaterally.  Respiratory:  Respirations regular and unlabored, clear to auscultation bilaterally. GI: Soft, nontender, nondistended. MS: No deformity or atrophy. Skin: Warm and dry, no rash. Neuro:  Strength and sensation are intact. Psych: Normal affect.  Assessment & Plan    Severe aortic stenosis status post TAVR/near syncope -update an echo -continue current medications -Medications include Eliquis 5 mg twice a day (appropriate dose), Lipitor 40 mg daily, magnesium 200 mg daily, metoprolol succinate 50 mg (twice a day), potassium 20 mEq daily, spironolactone 12.5 mg daily, torsemide 40 mg as needed for weight gain -euvolemic on exam today  CAD without angina -no chest pain -continue current medications  Paroxysmal atrial fibrillation -no bleeding issues on Eliquis -increase metoprolol for better rate control  Hypertension -well controlled today -continue to track closely with increase in metoprolol  Carotid artery disease -no recent [place] -would consider updating an [place] if echo is normal  Nonischemic cardiomyopathy/chronic systolic CHF -euvolemic on exam today -update echo -continue current medications       Disposition: Follow up 3-4 months with Lauree Chandler, MD or APP.  Signed, Elgie Collard, PA-C 10/01/2022, 12:33 PM [facility]

## 2022-10-01 ENCOUNTER — Ambulatory Visit: Payer: Medicare Other | Attending: Nurse Practitioner | Admitting: Physician Assistant

## 2022-10-01 ENCOUNTER — Encounter: Payer: Self-pay | Admitting: Physician Assistant

## 2022-10-01 ENCOUNTER — Ambulatory Visit: Payer: Medicare Other | Admitting: Nurse Practitioner

## 2022-10-01 VITALS — BP 116/74 | HR 114 | Ht 60.0 in | Wt 140.2 lb

## 2022-10-01 DIAGNOSIS — N183 Chronic kidney disease, stage 3 unspecified: Secondary | ICD-10-CM

## 2022-10-01 DIAGNOSIS — I10 Essential (primary) hypertension: Secondary | ICD-10-CM | POA: Diagnosis not present

## 2022-10-01 DIAGNOSIS — I5022 Chronic systolic (congestive) heart failure: Secondary | ICD-10-CM

## 2022-10-01 DIAGNOSIS — I5043 Acute on chronic combined systolic (congestive) and diastolic (congestive) heart failure: Secondary | ICD-10-CM

## 2022-10-01 DIAGNOSIS — I350 Nonrheumatic aortic (valve) stenosis: Secondary | ICD-10-CM

## 2022-10-01 DIAGNOSIS — I480 Paroxysmal atrial fibrillation: Secondary | ICD-10-CM

## 2022-10-01 DIAGNOSIS — I359 Nonrheumatic aortic valve disorder, unspecified: Secondary | ICD-10-CM

## 2022-10-01 DIAGNOSIS — Z952 Presence of prosthetic heart valve: Secondary | ICD-10-CM

## 2022-10-01 DIAGNOSIS — I6523 Occlusion and stenosis of bilateral carotid arteries: Secondary | ICD-10-CM | POA: Diagnosis not present

## 2022-10-01 DIAGNOSIS — E11638 Type 2 diabetes mellitus with other oral complications: Secondary | ICD-10-CM

## 2022-10-01 MED ORDER — METOPROLOL SUCCINATE ER 50 MG PO TB24: 50.0000 mg | ORAL_TABLET | Freq: Two times a day (BID) | ORAL | 3 refills | Status: DC

## 2022-10-01 NOTE — Patient Instructions (Signed)
Medication Instructions:  Change metoprolol succinate to 50 mg TWICE a day *If you need a refill on your cardiac medications before your next appointment, please call your pharmacy*   Lab Work: None If you have labs (blood work) drawn today and your tests are completely normal, you will receive your results only by: [place] (if you have MyChart) OR A paper copy in the mail If you have any lab test that is abnormal or we need to change your treatment, we will call you to review the results.   Testing/Procedures: Your physician has requested that you have an echocardiogram. Echocardiography is a painless test that uses sound waves to create images of your heart. It provides your doctor with information about the size and shape of your heart and how well your heart's chambers and valves are working. This procedure takes approximately one hour. There are no restrictions for this procedure. Please do NOT wear cologne, perfume, aftershave, or lotions (deodorant is allowed). Please arrive 15 minutes prior to your appointment time.    Follow-Up: At [facility], you and your health needs are our priority.  As part of our continuing mission to provide you with exceptional heart care, we have created designated Provider Care Teams.  These Care Teams include your primary Cardiologist (physician) and Advanced Practice Providers (APPs -  Physician Assistants and Nurse Practitioners) who all work together to provide you with the care you need, when you need it.   Your next appointment:   3-4 months (or his next available)  Provider:   Lauree Chandler, MD   Other Instructions Check your blood pressure daily, one hour after taking your morning medications and if your systolic (top number) falls below 100 call and let us know

## 2022-10-15 ENCOUNTER — Encounter: Payer: Self-pay | Admitting: Family Medicine

## 2022-10-15 ENCOUNTER — Non-Acute Institutional Stay: Payer: Medicare Other | Admitting: Family Medicine

## 2022-10-15 VITALS — BP 98/50 | HR 85 | Resp 16

## 2022-10-15 DIAGNOSIS — F419 Anxiety disorder, unspecified: Secondary | ICD-10-CM

## 2022-10-15 DIAGNOSIS — E1122 Type 2 diabetes mellitus with diabetic chronic kidney disease: Secondary | ICD-10-CM

## 2022-10-15 DIAGNOSIS — I5043 Acute on chronic combined systolic (congestive) and diastolic (congestive) heart failure: Secondary | ICD-10-CM

## 2022-10-15 NOTE — Progress Notes (Unsigned)
[facility] Consult Note Telephone: [phone]  Fax: [phone]   Date of encounter: 10/15/22 10:37 AM PATIENT NAME: Tara Fields [id number]   [phone] (home)  DOB: 1928-07-29 MRN: [id number] PRIMARY CARE PROVIDER:    [name], NP,  [place] [address] [phone]  REFERRING PROVIDER:   [name], NP [place],  [place] [address] [place] of Attorney:    Contact Information     Name Relation Home Work [place] Son [phone]  [phone]        I met face to face with patient in [place] and her son Louie Casa joined the visit. Palliative Care was asked to follow this patient by consultation request of Nafziger, Tommi Rumps, NP to address advance care planning and complex medical decision making. This is a follow up visit.  GOALS OF CARE: CODE STATUS: MOST as of 12/30/21: DNR/DNI with limited additional intervention Use of antibiotics if indicated  IV fluids and feeding tube on a case by case, time limited basis  ASSESSMENT AND / RECOMMENDATIONS:  PPS: 50% Acute on chronic combined systolic and diastolic congestive heart failure Acute phase resolved. Keep scheduled follow-up with cardiology. Last echo March 2023 with EF 20 to 25%.  Status post aortic valve replacement with perivalvular aortic insufficiency. Continue daily weights with metoprolol succinate at decreased dose 50 mg daily, spironolactone 25 mg half tablet daily, torsemide 40 mg daily with instructions to increase to twice daily for weight gain of 2 to 3 pounds in 24 hours or 5 pounds in 7 days.  Anxiety with agitation Significant improvement with addition of Abilify 10 mg nightly.  Continue.  Type II DM with CKD and no long-term insulin use Stable on diet alone at goal of less than 7.5%. Renal  function stable with a EGFR of 40.49 on 09/14/2022.   Follow up Palliative Care Visit:  Palliative Care continuing to follow up by monitoring for changes in appetite, weight, functional and cognitive status for chronic disease progression and management in agreement with patient's stated goals of care. Next visit in 4-6 weeks or prn.  This visit was coded based on medical decision making (MDM).  Chief Complaint  Palliative Care is following and pt had recent hospitalization for acute on chronic heart failure.  HISTORY OF PRESENT ILLNESS: Tara Fields is a 89 y.o. year old female with recent acute on chronic heart failure in late January.  She has scheduled daily Torsemide and dose is doubled with weight gain 2-3 lbs in 24 hrs or 5 lbs in 7 days. Has some trouble with laying flat but no sense of smothering, has O2 if needed.  Eating pretty good.  No recent falls. She states nocturia has improved with medication changes since her heart failure exacerbation.   ACTIVITIES OF DAILY LIVING: CONTINENT OF BLADDER? Yes CONTINENT OF BOWEL? Yes BATHING/DRESSING/FEEDING:  independent  MOBILITY:    AMBULATORY WITH ASSISTIVE DEVICE: rollator BEDBOUND? No  APPETITE? Good Weight 140 lbs 3.2 oz  History obtained from review of EMR, interview with son Louie Casa and/or patient.   CURRENT PROBLEM LIST:  Patient Active Problem List   Diagnosis Date Noted   Heart failure ([place]) 09/08/2022   Acute on chronic systolic (congestive) heart failure ([place]) 09/07/2022   Paroxysmal atrial fibrillation with RVR ([place]) 09/07/2022   Dyslipidemia 09/07/2022   Essential hypertension 09/07/2022   Vitamin B12 deficiency  09/07/2022   Type 2 diabetes mellitus with chronic kidney disease, without long-term current use of insulin ([place]) 09/07/2022   Hard skin lesion 08/06/2022   Anxiety with agitation 07/06/2022   Stress due to family tension 07/06/2022   Acute cystitis without hematuria [id number]   Chronic systolic  congestive heart failure ([place]) 02/03/2022   Pain of upper abdomen 12/12/2021   Difficulty swallowing pills 12/12/2021   Palliative care encounter 11/23/2021   Malnutrition of moderate degree 11/09/2021   Type 2 diabetes mellitus with hyperlipidemia ([place]) 11/08/2021   DNR (do not resuscitate) 11/08/2021   Leg cramps 09/03/2021   Atrial fibrillation with rapid ventricular response ([place]) 09/02/2021   Elevated troponin 09/02/2021   Lab test positive for detection of COVID-19 virus    Acute on chronic combined systolic and diastolic CHF (congestive heart failure) ([place])    Acute renal failure superimposed on stage 3b chronic kidney disease ([place])    Hyperlipidemia associated with type 2 diabetes mellitus ([place])    Overweight (BMI 25.0-29.9)    COVID-19 virus infection 06/07/2021   Generalized weakness 06/07/2021   GERD (gastroesophageal reflux disease) 06/07/2021   CHF (congestive heart failure) ([place]) 05/05/2021   GI bleed 04/13/2021   Anemia 04/13/2021   Symptomatic anemia 04/13/2021   Stage 3b chronic kidney disease (CKD) ([place]) 04/13/2021   Acute on chronic systolic CHF (congestive heart failure) ([place]) 01/29/2021   Cognitive deficits    Pressure injury of skin 12/19/2020   COPD (chronic obstructive pulmonary disease) ([place]) 09/02/2020   Acute respiratory failure with hypoxia (H[facility]) 09/01/2020   Pulmonary nodule 1 cm or greater in diameter 01/03/2020   S/P TAVR (transcatheter aortic valve replacement) 05/03/2019   Primary osteoarthritis involving multiple joints 05/03/2019   Persistent atrial fibrillation ([place]) 05/03/2019   Bilateral carotid artery stenosis 12/01/2017   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/13/2014   Aortic valve disorder 05/03/2014   Chronic obstructive bronchitis without exacerbation 05/03/2014   Derangement of left knee 05/03/2014   Esophageal reflux 05/03/2014   HTN (hypertension) 05/03/2014   Vitamin D deficiency 05/03/2014   PAST MEDICAL HISTORY:  Active  Ambulatory Problems    Diagnosis Date Noted   Aortic valve disorder 05/03/2014   Bilateral carotid artery stenosis 12/01/2017   Chronic obstructive bronchitis without exacerbation 05/03/2014   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/13/2014   Derangement of left knee 05/03/2014   Esophageal reflux 05/03/2014   HTN (hypertension) 05/03/2014   Vitamin D deficiency 05/03/2014   S/P TAVR (transcatheter aortic valve replacement) 05/03/2019   Primary osteoarthritis involving multiple joints 05/03/2019   Persistent atrial fibrillation ([place]) 05/03/2019   Pulmonary nodule 1 cm or greater in diameter 01/03/2020   Acute respiratory failure with hypoxia ([place]) 09/01/2020   COPD (chronic obstructive pulmonary disease) ([place]) 09/02/2020   Pressure injury of skin 12/19/2020   Cognitive deficits    Acute on chronic systolic CHF (congestive heart failure) ([place]) 01/29/2021   GI bleed 04/13/2021   Anemia 04/13/2021   Symptomatic anemia 04/13/2021   Stage 3b chronic kidney disease (CKD) ([place]) 04/13/2021   CHF (congestive heart failure) ([place]) 05/05/2021   COVID-19 virus infection 06/07/2021   Generalized weakness 06/07/2021   GERD (gastroesophageal reflux disease) 06/07/2021   Atrial fibrillation with rapid ventricular response ([place]) 09/02/2021   Lab test positive for detection of COVID-19 virus    Acute on chronic combined systolic and diastolic CHF (congestive heart failure) ([place])    Acute renal failure superimposed on stage 3b chronic kidney disease ([place])  Hyperlipidemia associated with type 2 diabetes mellitus ([place])    Overweight (BMI 25.0-29.9)    Elevated troponin 09/02/2021   Leg cramps 09/03/2021   Type 2 diabetes mellitus with hyperlipidemia ([place]) 11/08/2021   DNR (do not resuscitate) 11/08/2021   Malnutrition of moderate degree 11/09/2021   Palliative care encounter 11/23/2021   Pain of upper abdomen 12/12/2021   Difficulty swallowing pills 12/12/2021   Acute cystitis without hematuria  [id number]   Chronic systolic congestive heart failure ([place]) 02/03/2022   Anxiety with agitation 07/06/2022   Stress due to family tension 07/06/2022   Hard skin lesion 08/06/2022   Acute on chronic systolic (congestive) heart failure ([place]) 09/07/2022   Paroxysmal atrial fibrillation with RVR ([place]) 09/07/2022   Dyslipidemia 09/07/2022   Essential hypertension 09/07/2022   Vitamin B12 deficiency 09/07/2022   Type 2 diabetes mellitus with chronic kidney disease, without long-term current use of insulin ([place]) 09/07/2022   Heart failure ([place]) 09/08/2022   Resolved Ambulatory Problems    Diagnosis Date Noted   Pure hypercholesterolemia 05/03/2014   Acute exacerbation of CHF (congestive heart failure) ([place]) 12/18/2020   CHF (congestive heart failure) ([place]) 12/18/2020   UTI (urinary tract infection) 11/10/2021   Past Medical History:  Diagnosis Date   AKI (acute kidney injury) ([place])    Aortic stenosis    CAD (coronary artery disease)    Diabetes mellitus ([place])    Diverticulitis    Hyperlipidemia    Legally blind in left eye, as defined in [place]    Tobacco abuse     Preferred Pharmacy: ALLERGIES:  Allergies  Allergen Reactions   Amlodipine Besy-Benazepril Hcl Cough   Hctz [Hydrochlorothiazide] Other (See Comments)    Per family, every diuretic the patient has been on flushes out her out and ultimately results in CRAMPING/JAUNDICE (added potassium might help)   Other Other (See Comments)    History of diverticulitis- CANNOT TOLERATE NUTS, CORN, AND ANY FOODS NOT EASILY DIGESTED- the patient AVOIDS these   Cyclobenzaprine Other (See Comments)    Confusion- does not tolerate well    Diclofenac Sodium Diarrhea   Hydralazine Other (See Comments)    Per family, every diuretic the patient has been on flushes out her out and ultimately results in CRAMPING/JAUNDICE (added potassium might help)   Methylprednisolone Other (See Comments)    Medrol/CORTICOSTEROIDS = A LOT OF ENERGY Delusions     Oxycodone Other (See Comments)    Patient becomes very disorientated      PERTINENT MEDICATIONS:  Outpatient Encounter Medications as of 10/15/2022  Medication Sig   acetaminophen (TYLENOL) 500 MG tablet Take 500 mg by mouth 3 (three) times daily as needed for moderate pain. Take with Tramadol   ARIPiprazole (ABILIFY) 10 MG tablet Take 1 tablet (10 mg total) by mouth at bedtime.   cholecalciferol (VITAMIN D3) 25 MCG (1000 UNIT) tablet Take 1,000 Units by mouth 2 (two) times daily.   Coenzyme Q10 (CO Q-10) 300 MG CAPS Take 300 mg by mouth in the morning.   ELIQUIS 5 MG TABS tablet TAKE 1 TABLET BY MOUTH TWICE A DAY   ferrous gluconate (FERGON) 324 MG tablet TAKE 1 TABLET BY MOUTH DAILY WITH BREAKFAST   Lidocaine-Menthol, Spray, 4-1 % LIQD Apply 1 spray topically daily as needed (back/knee pain/restless legs).   Magnesium 200 MG TABS Take 200 mg by mouth every evening.   metoprolol succinate (TOPROL-XL) 50 MG 24 hr tablet Take 50 mg by mouth daily. Take with or immediately following a meal.  Multiple Vitamin (MULTIVITAMIN WITH MINERALS) TABS tablet Take 1 tablet by mouth daily. Centrum Minis   potassium chloride 20 MEQ/15ML (10%) SOLN TAKE 15 MLS (20 MEQ TOTAL) BY MOUTH DAILY.   psyllium (METAMUCIL) 58.6 % powder Take 1 packet by mouth daily as needed (for constipation).   spironolactone (ALDACTONE) 25 MG tablet TAKE 1/2 TABLET BY MOUTH DAILY   terazosin (HYTRIN) 10 MG capsule TAKE 1 CAPSULE BY MOUTH AT BEDTIME.   torsemide (DEMADEX) 20 MG tablet Take 2 tablets (40 mg total) by mouth daily. Incase of weight gain 2 to 3 lbs in 24 hrs or 5 lbs in 7 days, take 2 tablets twice a day until weight back to baseline.   traMADol (ULTRAM) 50 MG tablet TAKE 1 TABLET BY MOUTH THREE TIMES A DAY   vitamin B-12 (CYANOCOBALAMIN) 1000 MCG tablet Take 1 tablet (1,000 mcg total) by mouth daily.   atorvastatin (LIPITOR) 40 MG tablet TAKE 1 TABLET BY MOUTH DAILY AT 6 PM. (Patient taking differently: Take 40 mg by  mouth daily.)   [DISCONTINUED] cephALEXin (KEFLEX) 250 MG capsule TAKE 1 CAPSULE BY MOUTH DAILY.   [DISCONTINUED] metoprolol succinate (TOPROL-XL) 50 MG 24 hr tablet Take 1 tablet (50 mg total) by mouth 2 (two) times daily. Take with or immediately following a meal. (Patient taking differently: Take 50 mg by mouth daily. Take with or immediately following a meal.)   No facility-administered encounter medications on file as of 10/15/2022.   I reviewed available labs, medications, imaging, studies and related documents from the EMR.  CBC    Component Value Date/Time   WBC 5.3 09/08/2022 0610   RBC 3.92 09/08/2022 0610   HGB 12.3 09/08/2022 0610   HCT 36.9 09/08/2022 0610   PLT 173 09/08/2022 0610   MCV 94.1 09/08/2022 0610   MCV 91.8 01/25/2018 1428   MCH 31.4 09/08/2022 0610   MCHC 33.3 09/08/2022 0610   RDW 16.4 (H) 09/08/2022 0610   LYMPHSABS 1.1 06/22/2022 1405   MONOABS 0.7 06/22/2022 1405   EOSABS 0.1 06/22/2022 1405   BASOSABS 0.0 06/22/2022 1405    CMP    Latest Ref Rng & Units 09/14/2022    2:29 PM 09/09/2022    3:36 AM 09/08/2022    6:10 AM  CMP  Glucose 70 - 99 mg/dL 116  151  132   BUN 6 - 23 mg/dL 56  50  54   Creatinine 0.40 - 1.20 mg/dL 1.17  1.38  1.23   Sodium 135 - 145 mEq/L 135  135  136   Potassium 3.5 - 5.1 mEq/L 4.2  3.9  3.9   Chloride 96 - 112 mEq/L 100  103  102   CO2 19 - 32 mEq/L '26  22  24   '$ Calcium 8.4 - 10.5 mg/dL 9.1  9.0  8.8     LFTs    Latest Ref Rng & Units 09/07/2022    5:43 PM 06/22/2022    2:05 PM 04/23/2022    5:55 PM  Hepatic Function  Total Protein 6.5 - 8.1 g/dL 6.7  7.1  7.2   Albumin 3.5 - 5.0 g/dL 3.8  4.2  4.2   AST 15 - 41 U/L 32  26  21   ALT 0 - 44 U/L '17  21  14   '$ Alk Phosphatase 38 - 126 U/L 86  73  64   Total Bilirubin 0.3 - 1.2 mg/dL 0.7  0.7  1.0     Urinalysis  Component Value Date/Time   COLORURINE YELLOW 09/07/2022 1919   APPEARANCEUR CLEAR 09/07/2022 1919   LABSPEC 1.011 09/07/2022 1919   PHURINE 7.0  09/07/2022 1919   GLUCOSEU NEGATIVE 09/07/2022 1919   GLUCOSEU NEGATIVE 12/11/2019 1033   HGBUR NEGATIVE 09/07/2022 1919   BILIRUBINUR NEGATIVE 09/07/2022 1919   BILIRUBINUR neg 06/22/2022 [place] 09/07/2022 1919   PROTEINUR NEGATIVE 09/07/2022 1919   UROBILINOGEN 0.2 06/22/2022 1402   UROBILINOGEN 0.2 12/11/2019 1033   NITRITE NEGATIVE 09/07/2022 1919   LEUKOCYTESUR NEGATIVE 09/07/2022 1919   09/08/22 HGB A1c: 6.7%   Records reviewed and summarized above.    Physical Exam: GENERAL: NAD LUNGS: CTAB, no increased work of breathing, room air CARDIAC:  S1S2, RRR with no MRG, Y/N edema, Y/N cyanosis ABD:  Normo-active BS x 4 quads, soft, non-tender EXTREMITIES: Normal ROM, no deformity, strength equal, Y/N muscle atrophy/subcutaneous fat loss NEURO:  No weakness or cognitive impairment, aphasia-expressive/receptive  PSYCH:  non-anxious affect, A & O x 3  Thank you for the opportunity to participate in the care of [facility]. Please call our main office at [phone] if we can be of additional assistance.    Damaris Hippo FNP-C  Beckett [email or web address] .[facility]  Phone:  [phone]

## 2022-10-16 ENCOUNTER — Encounter: Payer: Self-pay | Admitting: Family Medicine

## 2022-10-27 ENCOUNTER — Ambulatory Visit (HOSPITAL_COMMUNITY): Payer: Medicare Other | Attending: Cardiovascular Disease

## 2022-10-27 DIAGNOSIS — I350 Nonrheumatic aortic (valve) stenosis: Secondary | ICD-10-CM | POA: Diagnosis present

## 2022-10-27 DIAGNOSIS — Z952 Presence of prosthetic heart valve: Secondary | ICD-10-CM | POA: Diagnosis not present

## 2022-10-28 LAB — ECHOCARDIOGRAM COMPLETE
AR max vel: 1.12 cm2
AV Area VTI: 0.97 cm2
AV Area mean vel: 1.07 cm2
AV Mean grad: 8.2 mmHg
AV Peak grad: 15 mmHg
Ao pk vel: 1.93 m/s
Area-P 1/2: 4.56 cm2
MV M vel: 4.83 m/s
MV Peak grad: 93.3 mmHg
P 1/2 time: 439 msec
Radius: 0.7 cm
S' Lateral: 5.8 cm

## 2022-11-01 ENCOUNTER — Other Ambulatory Visit: Payer: Self-pay | Admitting: Adult Health

## 2022-11-03 NOTE — Telephone Encounter (Signed)
Okay for refill?  

## 2022-11-18 ENCOUNTER — Telehealth: Payer: Self-pay

## 2022-11-18 DIAGNOSIS — I10 Essential (primary) hypertension: Secondary | ICD-10-CM

## 2022-11-18 DIAGNOSIS — E1169 Type 2 diabetes mellitus with other specified complication: Secondary | ICD-10-CM

## 2022-11-18 NOTE — Telephone Encounter (Signed)
-----   Message from Maren Reamer, [facility] sent at 11/11/2022 11:00 AM EDT ----- Regarding: 2300-Pharmacy Pt is needing a 2300-Pharmacy referral placed for management of HTN and COPD per PCP.  Thank you, Maren Reamer Clinical Pharmacist [phone]

## 2022-11-24 ENCOUNTER — Non-Acute Institutional Stay: Payer: Medicare Other | Admitting: Family Medicine

## 2022-11-24 ENCOUNTER — Encounter: Payer: Self-pay | Admitting: Family Medicine

## 2022-11-24 VITALS — BP 106/50 | HR 97 | Temp 97.7°F

## 2022-11-24 DIAGNOSIS — R42 Dizziness and giddiness: Secondary | ICD-10-CM

## 2022-11-24 DIAGNOSIS — F419 Anxiety disorder, unspecified: Secondary | ICD-10-CM

## 2022-11-24 DIAGNOSIS — I5043 Acute on chronic combined systolic (congestive) and diastolic (congestive) heart failure: Secondary | ICD-10-CM

## 2022-11-24 NOTE — Progress Notes (Signed)
Therapist, nutritional Palliative Care Consult Note Telephone: [phone]  Fax: [phone]   Date of encounter: 11/24/22 10:56 AM PATIENT NAME: Tara Fields [address] [place] [address]   [phone] (home)  DOB: 12-Apr-1928 MRN: [id number] PRIMARY CARE PROVIDER:    Shirline Frees, NP,  [address] [place] [address] [phone]  REFERRING PROVIDER:   Shirline Frees, NP [address] [place],  [place] [address] [phone]  Emergency Contact:    Contact Information     Name Relation Home Work Mobile   Berland,Randy Son [phone]  [phone]        I met face to face with patient and son [name] at [facility] Palliative Care was asked to follow this patient by consultation request of Nafziger, Kandee Keen, NP to address advance care planning and complex medical decision making. This is a follow up visit.  CODE STATUS: MOST as of 12/30/21: DNR/DNI with limited additional intervention Use of antibiotics if indicated  IV fluids and feeding tube on a case by case, time limited basis  ASSESSMENT AND / RECOMMENDATIONS:  PPS: 87%  Acute on chronic combined systolic and diastolic congestive heart failure   Continue current medication regimen without change.  Educated and reinforced need for daily weights.  Continue Toprol 50 mg qd, spironolactone 12.5 mg po qd, torsemide 40 mg qd. '  2.  Anxiety Stable, continue Abilify 10 mg qhs.   Consider addition PRN medication for uncontrolled symptoms-either Hydroxyzine or Clonidine.    3.  Dizziness  Concerns for orthostatic hypotension.  Encouraged slow position change and increased fluid intake to facilitate venous return. Encouraged maintaining BP log of BP daily until cardiology appointment pending in May.  Consider PT to eval and treat for strengthening, transfers and balance.   Follow up Palliative Care Visit:  Palliative Care  is  continuing to follow up by monitoring for changes in appetite, weight, functional and cognitive status for chronic disease progression and management in agreement with patient's stated goals of care. Next visit in 4 weeks or prn.  This visit was coded based on medical decision making (MDM).  Chief Complaint  Palliative Care continues to follow for medical management of chronic conditions and comorbidities in setting of chronic combined systolic and diastolic heart failure.    HISTORY OF PRESENT ILLNESS: [name] is a 89 y.o. year old female with Acute on Chronic combined systolic and diastolic congestive heart failure, anxiety with intermittent episodes of agitation.  Had 2 recent episodes of lightheadedness and near syncope.  One episode happened after urinating and had some urine on the floor after patient bent over to clean it up, the other situation happened when going from sitting in the chair to standing up.  No recent falls (Dec last fall), no changes to medications.  No acute complaints at this time.    ACTIVITIES OF DAILY LIVING: CONTINENT OF BLADDER/ BOWEL? Yes BATHING/DRESSING/FEEDING-independent  MOBILITY:  AMBULATORY WITH ASSISTIVE DEVICE:  Rollator or power scooter for distances  APPETITE? Fair - patient now eating 2 meals in room and 1 meal in dining area  CURRENT PROBLEM LIST:  Patient Active Problem List   Diagnosis Date Noted   Dyslipidemia 09/07/2022   Essential hypertension 09/07/2022   Vitamin B12 deficiency 09/07/2022   Type 2 diabetes mellitus with chronic kidney disease, without long-term current use of insulin 09/07/2022   Hard skin lesion 08/06/2022   Anxiety with agitation 07/06/2022   Stress due to family  tension 07/06/2022   Pain of upper abdomen 12/12/2021   Difficulty swallowing pills 12/12/2021   Palliative care encounter 11/23/2021   Malnutrition of moderate degree 11/09/2021   Type 2 diabetes mellitus with hyperlipidemia 11/08/2021   DNR  (do not resuscitate) 11/08/2021   Leg cramps 09/03/2021   Acute renal failure superimposed on stage 3b chronic kidney disease    Hyperlipidemia associated with type 2 diabetes mellitus    Overweight (BMI 25.0-29.9)    Generalized weakness 06/07/2021   GERD (gastroesophageal reflux disease) 06/07/2021   Chronic systolic congestive heart failure 05/05/2021   Anemia 04/13/2021   Symptomatic anemia 04/13/2021   Stage 3b chronic kidney disease (CKD) 04/13/2021   Cognitive deficits    Pressure injury of skin 12/19/2020   COPD (chronic obstructive pulmonary disease) 09/02/2020   Acute respiratory failure with hypoxia 09/01/2020   Pulmonary nodule 1 cm or greater in diameter 01/03/2020   S/P TAVR (transcatheter aortic valve replacement) 05/03/2019   Primary osteoarthritis involving multiple joints 05/03/2019   Persistent atrial fibrillation (HCC) 05/03/2019   Bilateral carotid artery stenosis 12/01/2017   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/13/2014   Aortic valve disorder 05/03/2014   Chronic obstructive bronchitis without exacerbation 05/03/2014   Derangement of left knee 05/03/2014   Esophageal reflux 05/03/2014   HTN (hypertension) 05/03/2014   Vitamin D deficiency 05/03/2014   PAST MEDICAL HISTORY:  Active Ambulatory Problems    Diagnosis Date Noted   Aortic valve disorder 05/03/2014   Bilateral carotid artery stenosis 12/01/2017   Chronic obstructive bronchitis without exacerbation 05/03/2014   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/13/2014   Derangement of left knee 05/03/2014   Esophageal reflux 05/03/2014   HTN (hypertension) 05/03/2014   Vitamin D deficiency 05/03/2014   S/P TAVR (transcatheter aortic valve replacement) 05/03/2019   Primary osteoarthritis involving multiple joints 05/03/2019   Persistent atrial fibrillation (HCC) 05/03/2019   Pulmonary nodule 1 cm or greater in diameter 01/03/2020   Acute respiratory failure with hypoxia 09/01/2020   COPD (chronic  obstructive pulmonary disease) 09/02/2020   Pressure injury of skin 12/19/2020   Cognitive deficits    Anemia 04/13/2021   Symptomatic anemia 04/13/2021   Stage 3b chronic kidney disease (CKD) 04/13/2021   Generalized weakness 06/07/2021   GERD (gastroesophageal reflux disease) 06/07/2021   Acute renal failure superimposed on stage 3b chronic kidney disease    Hyperlipidemia associated with type 2 diabetes mellitus    Overweight (BMI 25.0-29.9)    Leg cramps 09/03/2021   Type 2 diabetes mellitus with hyperlipidemia 11/08/2021   DNR (do not resuscitate) 11/08/2021   Malnutrition of moderate degree 11/09/2021   Palliative care encounter 11/23/2021   Pain of upper abdomen 12/12/2021   Difficulty swallowing pills 12/12/2021   Chronic systolic congestive heart failure 05/05/2021   Anxiety with agitation 07/06/2022   Stress due to family tension 07/06/2022   Hard skin lesion 08/06/2022   Dyslipidemia 09/07/2022   Essential hypertension 09/07/2022   Vitamin B12 deficiency 09/07/2022   Type 2 diabetes mellitus with chronic kidney disease, without long-term current use of insulin 09/07/2022   Resolved Ambulatory Problems    Diagnosis Date Noted   Pure hypercholesterolemia 05/03/2014   Acute exacerbation of CHF (congestive heart failure) 12/18/2020   CHF (congestive heart failure) 12/18/2020   Acute on chronic systolic CHF (congestive heart failure) 01/29/2021   GI bleed 04/13/2021   COVID-19 virus infection 06/07/2021   Atrial fibrillation with rapid ventricular response 09/02/2021   Lab test positive for detection  of COVID-19 virus    Acute on chronic combined systolic and diastolic CHF (congestive heart failure)    Elevated troponin 09/02/2021   UTI (urinary tract infection) 11/10/2021   Acute cystitis without hematuria 02/03/2022   Acute on chronic systolic (congestive) heart failure 09/07/2022   Paroxysmal atrial fibrillation with RVR 09/07/2022   Past Medical History:   Diagnosis Date   AKI (acute kidney injury) (HCC)    Aortic stenosis    CAD (coronary artery disease)    Diabetes mellitus (HCC)    Diverticulitis    Hyperlipidemia    Legally blind in left eye, as defined in [place]    Tobacco abuse    SOCIAL HX:  Social History   Tobacco Use   Smoking status: Former    Packs/day: 1.00    Years: 70.00    Additional pack years: 0.00    Total pack years: 70.00    Types: Cigarettes    Quit date: 12/15/2019    Years since quitting: 2.9   Smokeless tobacco: Never   Tobacco comments:    Pt states quiting a year ago. 01/03/20 [facility]J  Substance Use Topics   Alcohol use: No    Alcohol/week: 0.0 standard drinks of alcohol   FAMILY HX:  Family History  Problem Relation Age of Onset   Diabetes Mother    Stroke Brother    Cerebral palsy Brother    Coronary artery disease Neg Hx      Preferred Pharmacy: ALLERGIES:  Allergies  Allergen Reactions   Amlodipine Besy-Benazepril Hcl Cough   Hctz [Hydrochlorothiazide] Other (See Comments)    Per family, every diuretic the patient has been on flushes out her out and ultimately results in CRAMPING/JAUNDICE (added potassium might help)   Other Other (See Comments)    History of diverticulitis- CANNOT TOLERATE NUTS, CORN, AND ANY FOODS NOT EASILY DIGESTED- the patient AVOIDS these   Cyclobenzaprine Other (See Comments)    Confusion- does not tolerate well    Diclofenac Sodium Diarrhea   Hydralazine Other (See Comments)    Per family, every diuretic the patient has been on flushes out her out and ultimately results in CRAMPING/JAUNDICE (added potassium might help)   Methylprednisolone Other (See Comments)    Medrol/CORTICOSTEROIDS = A LOT OF ENERGY Delusions    Oxycodone Other (See Comments)    Patient becomes very disorientated      PERTINENT MEDICATIONS:  Outpatient Encounter Medications as of 11/24/2022  Medication Sig   acetaminophen (TYLENOL) 500 MG tablet Take 500 mg by mouth 3 (three) times daily as  needed for moderate pain. Take with Tramadol   ARIPiprazole (ABILIFY) 10 MG tablet Take 1 tablet (10 mg total) by mouth at bedtime.   atorvastatin (LIPITOR) 40 MG tablet TAKE 1 TABLET BY MOUTH DAILY AT 6 PM. (Patient taking differently: Take 40 mg by mouth daily.)   cholecalciferol (VITAMIN D3) 25 MCG (1000 UNIT) tablet Take 1,000 Units by mouth 2 (two) times daily.   Coenzyme Q10 (CO Q-10) 300 MG CAPS Take 300 mg by mouth in the morning.   ELIQUIS 5 MG TABS tablet TAKE 1 TABLET BY MOUTH TWICE A DAY   ferrous gluconate (FERGON) 324 MG tablet TAKE 1 TABLET BY MOUTH DAILY WITH BREAKFAST   Lidocaine-Menthol, Spray, 4-1 % LIQD Apply 1 spray topically daily as needed (back/knee pain/restless legs).   Magnesium 200 MG TABS Take 200 mg by mouth every evening.   metoprolol succinate (TOPROL-XL) 50 MG 24 hr tablet Take 50 mg by  mouth daily. Take with or immediately following a meal.   Multiple Vitamin (MULTIVITAMIN WITH MINERALS) TABS tablet Take 1 tablet by mouth daily. Centrum Minis   potassium chloride 20 MEQ/15ML (10%) SOLN TAKE 15 MLS (20 MEQ TOTAL) BY MOUTH DAILY.   psyllium (METAMUCIL) 58.6 % powder Take 1 packet by mouth daily as needed (for constipation).   spironolactone (ALDACTONE) 25 MG tablet TAKE 1/2 TABLET BY MOUTH DAILY   terazosin (HYTRIN) 10 MG capsule TAKE 1 CAPSULE BY MOUTH AT BEDTIME.   torsemide (DEMADEX) 20 MG tablet Take 2 tablets (40 mg total) by mouth daily. Incase of weight gain 2 to 3 lbs in 24 hrs or 5 lbs in 7 days, take 2 tablets twice a day until weight back to baseline.   traMADol (ULTRAM) 50 MG tablet TAKE 1 TABLET BY MOUTH THREE TIMES A DAY   vitamin B-12 (CYANOCOBALAMIN) 1000 MCG tablet Take 1 tablet (1,000 mcg total) by mouth daily.   No facility-administered encounter medications on file as of 11/24/2022.    History obtained from review of EMR, discussion  with family, facility staff/caregiver and/or patient.   CBC    Component Value Date/Time   WBC 5.3  09/08/2022 0610   RBC 3.92 09/08/2022 0610   HGB 12.3 09/08/2022 0610   HCT 36.9 09/08/2022 0610   PLT 173 09/08/2022 0610   MCV 94.1 09/08/2022 0610   MCV 91.8 01/25/2018 1428   MCH 31.4 09/08/2022 0610   MCHC 33.3 09/08/2022 0610   RDW 16.4 (H) 09/08/2022 0610   LYMPHSABS 1.1 06/22/2022 1405   MONOABS 0.7 06/22/2022 1405   EOSABS 0.1 06/22/2022 1405   BASOSABS 0.0 06/22/2022 1405    CMP    Latest Ref Rng & Units 09/14/2022    2:29 PM 09/09/2022    3:36 AM 09/08/2022    6:10 AM  CMP  Glucose 70 - 99 mg/dL 161  096  045   BUN 6 - 23 mg/dL 56  50  54   Creatinine 0.40 - 1.20 mg/dL 4.09  8.11  9.14   Sodium 135 - 145 mEq/L 135  135  136   Potassium 3.5 - 5.1 mEq/L 4.2  3.9  3.9   Chloride 96 - 112 mEq/L 100  103  102   CO2 19 - 32 mEq/L 26  22  24    Calcium 8.4 - 10.5 mg/dL 9.1  9.0  8.8     LFTs    Latest Ref Rng & Units 09/07/2022    5:43 PM 06/22/2022    2:05 PM 04/23/2022    5:55 PM  Hepatic Function  Total Protein 6.5 - 8.1 g/dL 6.7  7.1  7.2   Albumin 3.5 - 5.0 g/dL 3.8  4.2  4.2   AST 15 - 41 U/L 32  26  21   ALT 0 - 44 U/L 17  21  14    Alk Phosphatase 38 - 126 U/L 86  73  64   Total Bilirubin 0.3 - 1.2 mg/dL 0.7  0.7  1.0     Urinalysis    Component Value Date/Time   COLORURINE YELLOW 09/07/2022 1919   APPEARANCEUR CLEAR 09/07/2022 1919   LABSPEC 1.011 09/07/2022 1919   PHURINE 7.0 09/07/2022 1919   GLUCOSEU NEGATIVE 09/07/2022 1919   GLUCOSEU NEGATIVE 12/11/2019 1033   HGBUR NEGATIVE 09/07/2022 1919   BILIRUBINUR NEGATIVE 09/07/2022 1919   BILIRUBINUR neg 06/22/2022 1402   KETONESUR NEGATIVE 09/07/2022 1919   PROTEINUR NEGATIVE 09/07/2022 1919  UROBILINOGEN 0.2 06/22/2022 1402   UROBILINOGEN 0.2 12/11/2019 1033   NITRITE NEGATIVE 09/07/2022 1919   LEUKOCYTESUR NEGATIVE 09/07/2022 1919    BNP (last 3 results) Recent Labs    09/07/22 1744  BNP 1,839.2*    I reviewed available labs, medications, imaging, studies and related documents from the  EMR.  Reviewed and summarized records.   Physical Exam: GENERAL: NAD LUNGS: CTAB, no increased work of breathing, room air CARDIAC:  S1S2, RRR with no MRG, Peripheral Edema LLE Trace Edema to ankle area, No cyanosis ABD:  Normo-active BS x 4 quads, soft, non-tender EXTREMITIES: Decreased ROM Bilateral Knees especially with R knee, Bouchards Nodules Bilateral hands, strength equal, No muscle atrophy/subcutaneous fat loss NEURO:  No weakness or cognitive impairment PSYCH:  non-anxious affect, A & O x 3, changes conversation at times with discussion of uncomfortable topics and addressing noncompliance   Thank you for the opportunity to participate in the care of [facility]. Please call our main office at [phone] if we can be of additional assistance.    Joycelyn Man FNP-C  [facility]  Phone:  [phone]

## 2022-11-26 ENCOUNTER — Encounter (HOSPITAL_BASED_OUTPATIENT_CLINIC_OR_DEPARTMENT_OTHER): Payer: Self-pay

## 2022-11-26 ENCOUNTER — Emergency Department (HOSPITAL_BASED_OUTPATIENT_CLINIC_OR_DEPARTMENT_OTHER): Payer: Medicare Other

## 2022-11-26 ENCOUNTER — Other Ambulatory Visit: Payer: Self-pay

## 2022-11-26 ENCOUNTER — Encounter: Payer: Self-pay | Admitting: Adult Health

## 2022-11-26 ENCOUNTER — Other Ambulatory Visit (HOSPITAL_BASED_OUTPATIENT_CLINIC_OR_DEPARTMENT_OTHER): Payer: Self-pay

## 2022-11-26 ENCOUNTER — Emergency Department (HOSPITAL_BASED_OUTPATIENT_CLINIC_OR_DEPARTMENT_OTHER)
Admission: EM | Admit: 2022-11-26 | Discharge: 2022-11-26 | Disposition: A | Discharge status: Home / Self Care | Payer: Medicare Other | Attending: Emergency Medicine | Admitting: Emergency Medicine

## 2022-11-26 DIAGNOSIS — I509 Heart failure, unspecified: Secondary | ICD-10-CM | POA: Insufficient documentation

## 2022-11-26 DIAGNOSIS — R410 Disorientation, unspecified: Secondary | ICD-10-CM | POA: Insufficient documentation

## 2022-11-26 DIAGNOSIS — Z1152 Encounter for screening for COVID-19: Secondary | ICD-10-CM | POA: Diagnosis not present

## 2022-11-26 DIAGNOSIS — E875 Hyperkalemia: Secondary | ICD-10-CM | POA: Diagnosis not present

## 2022-11-26 DIAGNOSIS — Z7901 Long term (current) use of anticoagulants: Secondary | ICD-10-CM | POA: Insufficient documentation

## 2022-11-26 DIAGNOSIS — I110 Hypertensive heart disease with heart failure: Secondary | ICD-10-CM | POA: Insufficient documentation

## 2022-11-26 DIAGNOSIS — I083 Combined rheumatic disorders of mitral, aortic and tricuspid valves: Secondary | ICD-10-CM | POA: Insufficient documentation

## 2022-11-26 DIAGNOSIS — Z87440 Personal history of urinary (tract) infections: Secondary | ICD-10-CM | POA: Diagnosis not present

## 2022-11-26 DIAGNOSIS — R413 Other amnesia: Secondary | ICD-10-CM | POA: Diagnosis present

## 2022-11-26 DIAGNOSIS — R7989 Other specified abnormal findings of blood chemistry: Secondary | ICD-10-CM

## 2022-11-26 LAB — URINALYSIS, ROUTINE W REFLEX MICROSCOPIC
Bacteria, UA: NONE SEEN
Bilirubin Urine: NEGATIVE
Glucose, UA: NEGATIVE mg/dL
Ketones, ur: NEGATIVE mg/dL
Nitrite: NEGATIVE
Protein, ur: NEGATIVE mg/dL
Specific Gravity, Urine: 1.009 (ref 1.005–1.030)
pH: 8 (ref 5.0–8.0)

## 2022-11-26 LAB — COMPREHENSIVE METABOLIC PANEL
ALT: 13 U/L (ref 0–44)
AST: 30 U/L (ref 15–41)
Albumin: 4.1 g/dL (ref 3.5–5.0)
Alkaline Phosphatase: 76 U/L (ref 38–126)
Anion gap: 13 (ref 5–15)
BUN: 53 mg/dL — ABNORMAL HIGH (ref 8–23)
CO2: 24 mmol/L (ref 22–32)
Calcium: 10.2 mg/dL (ref 8.9–10.3)
Chloride: 98 mmol/L (ref 98–111)
Creatinine, Ser: 1.53 mg/dL — ABNORMAL HIGH (ref 0.44–1.00)
GFR, Estimated: 32 mL/min — ABNORMAL LOW (ref >60)
Glucose, Bld: 107 mg/dL — ABNORMAL HIGH (ref 70–99)
Potassium: 5.2 mmol/L — ABNORMAL HIGH (ref 3.5–5.1)
Sodium: 135 mmol/L (ref 135–145)
Total Bilirubin: 1.1 mg/dL (ref 0.3–1.2)
Total Protein: 7.1 g/dL (ref 6.5–8.1)

## 2022-11-26 LAB — BRAIN NATRIURETIC PEPTIDE: B Natriuretic Peptide: 1711.5 pg/mL — ABNORMAL HIGH (ref 0.0–100.0)

## 2022-11-26 LAB — RESP PANEL BY RT-PCR (RSV, FLU A&B, COVID)  RVPGX2
Influenza A by PCR: NEGATIVE
Influenza B by PCR: NEGATIVE
Resp Syncytial Virus by PCR: NEGATIVE
SARS Coronavirus 2 by RT PCR: NEGATIVE

## 2022-11-26 LAB — CBC
HCT: 38 % (ref 36.0–46.0)
Hemoglobin: 12.8 g/dL (ref 12.0–15.0)
MCH: 31.1 pg (ref 26.0–34.0)
MCHC: 33.7 g/dL (ref 30.0–36.0)
MCV: 92.5 fL (ref 80.0–100.0)
Platelets: 200 10*3/uL (ref 150–400)
RBC: 4.11 MIL/uL (ref 3.87–5.11)
RDW: 14.7 % (ref 11.5–15.5)
WBC: 5.8 10*3/uL (ref 4.0–10.5)
nRBC: 0 % (ref 0.0–0.2)

## 2022-11-26 LAB — TROPONIN I (HIGH SENSITIVITY): Troponin I (High Sensitivity): 24 ng/L — ABNORMAL HIGH (ref <18)

## 2022-11-26 MED ORDER — SODIUM CHLORIDE 0.9 % IV BOLUS
500.0000 mL | Freq: Once | INTRAVENOUS | Status: AC
  Administered 2022-11-26: 500 mL via INTRAVENOUS

## 2022-11-26 MED ORDER — LORAZEPAM 1 MG PO TABS
0.5000 mg | ORAL_TABLET | Freq: Once | ORAL | Status: DC
  Filled 2022-11-26: qty 1

## 2022-11-26 MED ORDER — DIAZEPAM 5 MG/ML IJ SOLN
2.5000 mg | Freq: Once | INTRAMUSCULAR | Status: AC
  Administered 2022-11-26: 2.5 mg via INTRAMUSCULAR
  Filled 2022-11-26: qty 2

## 2022-11-26 NOTE — ED Triage Notes (Signed)
Patient here POV from Home.  Endorses over the past Week Problems with Memory and distinguishing between AM and PM. This has occurred in the past and has been present over the past week.   Son believes she has a UTI. Patient endorses some Pressure in Bladder or ABD Area.   NAD noted during Triage. A&Ox4. Gcs 15. Ambulatory.

## 2022-11-26 NOTE — ED Notes (Signed)
Pt has been sipping on water that is at bedside without any complaints or issues.

## 2022-11-26 NOTE — ED Provider Notes (Signed)
3:13 PM Assumed care of patient from off-going team. For more details, please see note from same day.  In brief, this is a 89 y.o. female who p/w son for increased confusion, memory issues. Abilify on med list, likely has baseline dementia. Coming from facility. Metabolic w/u unremarkable except for AKI. Have them holding her spironolactone because likely dehydrated/dry.  Plan/Dispo at time of sign-out & ED Course since sign-out: [ ]  CTH, plan for DC if negative  BP 113/65 (BP Location: Right Arm)   Pulse 91   Temp 98.3 F (36.8 C) (Oral)   Resp 18   Ht 5' (1.524 m)   Wt 63.6 kg   SpO2 100%   BMI 27.38 kg/m    ED Course:   Clinical Course as of 11/26/22 1711  Fri Nov 26, 2022  1711 CT Head Wo Contrast 1. No acute intracranial abnormality. 2. Age related atrophy and chronic small vessel ischemic change.   [HN]    Clinical Course User Index [HN] Loetta Rough, MD    Dispo: DC w/ discharge instructions/return precautions. All questions answered to patient's satisfaction.   ------------------------------- Vivi Barrack, MD Emergency Medicine  This note was created using dictation software, which may contain spelling or grammatical errors.   Loetta Rough, MD 11/26/22 [phone]

## 2022-11-26 NOTE — Discharge Instructions (Addendum)
It was our pleasure to provide your ER care today - we hope that you feel better.  From today's lab tests, your kidney function (creatinine) is mildly increased from prior, and potassium is slightly high. Make sure to drink adequate fluids/stay well hydrated. Hold/do not take your spironolactone.   Try to get adequate sleep at night so as to normalize your sleep/wake cycle.   Follow up closely with primary care doctor in the coming week.  Return to ER if worse, new symptoms, fevers, chest pain, increased trouble breathing, or other concern.

## 2022-11-26 NOTE — ED Notes (Signed)
Pt is aware urine sample is needed. 

## 2022-11-26 NOTE — ED Provider Notes (Signed)
EMERGENCY DEPARTMENT AT [facility] Provider Note   CSN: [id number] Arrival date & time: 11/26/22  1256     History  Chief Complaint  Patient presents with   Memory Loss    Tara Fields is a 89 y.o. female.  Pt with hx chf, presents w family, indicating in past 1-2 weeks increased confusion including trouble distinguishing between day and night hours, making some confused statements, and also noting increased wob, and noting hx uti.   Pt limited historian, - level 5 caveat. Pt denies specific area of complaint or pain, but does acknowledge in general is not feeling well.  No headache. No chest pain or discomfort. No sob. Denies cough or fever. No abd pain or vomiting/diarrhea. Has noted decreased appetite and decreased po intake. ?mild dysuria. Has not noticed new odor to urine. No extremity pain or swelling. Denies change in meds. No trauma/fall. No change in speech or vision. No new numbness/weakness or loss of baseline functional ability.   The history is provided by the patient, a relative and medical records. The history is limited by the condition of the patient.       Home Medications Prior to Admission medications   Medication Sig Start Date End Date Taking? Authorizing Provider  acetaminophen (TYLENOL) 500 MG tablet Take 500 mg by mouth 3 (three) times daily as needed for moderate pain. Take with Tramadol    [provider]  ARIPiprazole (ABILIFY) 10 MG tablet Take 1 tablet (10 mg total) by mouth at bedtime. 09/14/22   Nafziger, Kandee Keen, NP  atorvastatin (LIPITOR) 40 MG tablet TAKE 1 TABLET BY MOUTH DAILY AT 6 PM. Patient taking differently: Take 40 mg by mouth daily. 06/18/22   Nafziger, Kandee Keen, NP  cholecalciferol (VITAMIN D3) 25 MCG (1000 UNIT) tablet Take 1,000 Units by mouth 2 (two) times daily.    [provider]  Coenzyme Q10 (CO Q-10) 300 MG CAPS Take 300 mg by mouth in the morning.    [provider]  ELIQUIS 5 MG TABS  tablet TAKE 1 TABLET BY MOUTH TWICE A DAY 09/01/22   Nafziger, Kandee Keen, NP  ferrous gluconate (FERGON) 324 MG tablet TAKE 1 TABLET BY MOUTH DAILY WITH BREAKFAST 09/21/22   Nafziger, Kandee Keen, NP  Lidocaine-Menthol, Spray, 4-1 % LIQD Apply 1 spray topically daily as needed (back/knee pain/restless legs).    [provider]  Magnesium 200 MG TABS Take 200 mg by mouth every evening.    [provider]  metoprolol succinate (TOPROL-XL) 50 MG 24 hr tablet Take 50 mg by mouth daily. Take with or immediately following a meal. 09/09/22     Multiple Vitamin (MULTIVITAMIN WITH MINERALS) TABS tablet Take 1 tablet by mouth daily. Centrum Minis    [provider]  potassium chloride 20 MEQ/15ML (10%) SOLN TAKE 15 MLS (20 MEQ TOTAL) BY MOUTH DAILY. 09/16/22 12/15/22  Dyann Kief, PA-C  psyllium (METAMUCIL) 58.6 % powder Take 1 packet by mouth daily as needed (for constipation).    [provider]  spironolactone (ALDACTONE) 25 MG tablet TAKE 1/2 TABLET BY MOUTH DAILY 09/16/22   Nafziger, Kandee Keen, NP  terazosin (HYTRIN) 10 MG capsule TAKE 1 CAPSULE BY MOUTH AT BEDTIME. 05/11/22   Nafziger, Kandee Keen, NP  torsemide (DEMADEX) 20 MG tablet Take 2 tablets (40 mg total) by mouth daily. Incase of weight gain 2 to 3 lbs in 24 hrs or 5 lbs in 7 days, take 2 tablets twice a day until weight back to baseline. 09/09/22  Arrien, York Ram, MD  traMADol Janean Sark) 50 MG tablet TAKE 1 TABLET BY MOUTH THREE TIMES A DAY 11/03/22   Nafziger, Kandee Keen, NP  vitamin B-12 (CYANOCOBALAMIN) 1000 MCG tablet Take 1 tablet (1,000 mcg total) by mouth daily. 04/16/21   Meredeth Ide, MD      Allergies    Amlodipine besy-benazepril hcl, Hctz [hydrochlorothiazide], Other, Cyclobenzaprine, Diclofenac sodium, Hydralazine, Methylprednisolone, and Oxycodone    Review of Systems   Review of Systems  Constitutional:  Positive for appetite change. Negative for chills and fever.  HENT:  Negative for sore throat.   Eyes:  Negative for  visual disturbance.  Respiratory:  Negative for cough and shortness of breath.   Cardiovascular:  Negative for chest pain and leg swelling.  Gastrointestinal:  Negative for abdominal pain, diarrhea and vomiting.  Genitourinary:  Positive for dysuria. Negative for flank pain.  Musculoskeletal:  Negative for back pain and neck pain.  Skin:  Negative for rash.  Neurological:  Negative for speech difficulty, weakness, numbness and headaches.  Psychiatric/Behavioral:  Positive for confusion.     Physical Exam Updated Vital Signs BP 113/65 (BP Location: Right Arm)   Pulse 91   Temp 98.3 F (36.8 C) (Oral)   Resp 18   Ht 1.524 m (5')   Wt 63.6 kg   SpO2 100%   BMI 27.38 kg/m  Physical Exam Vitals and nursing note reviewed.  Constitutional:      Appearance: Normal appearance. She is well-developed.  HENT:     Head: Atraumatic.     Nose: Nose normal.     Mouth/Throat:     Pharynx: No oropharyngeal exudate or posterior oropharyngeal erythema.     Comments: Dry MM.  Eyes:     General: No scleral icterus.    Conjunctiva/sclera: Conjunctivae normal.     Pupils: Pupils are equal, round, and reactive to light.  Neck:     Vascular: No carotid bruit.     Trachea: No tracheal deviation.     Comments: No stiffness or rigidity.  Cardiovascular:     Rate and Rhythm: Normal rate and regular rhythm.     Pulses: Normal pulses.     Heart sounds: Normal heart sounds. No murmur heard.    No friction rub. No gallop.  Pulmonary:     Effort: Pulmonary effort is normal. No respiratory distress.     Breath sounds: Normal breath sounds.  Abdominal:     General: Bowel sounds are normal. There is no distension.     Palpations: Abdomen is soft.     Tenderness: There is no abdominal tenderness. There is no guarding.  Genitourinary:    Comments: No cva tenderness.  Musculoskeletal:        General: No swelling or tenderness.     Cervical back: Normal range of motion and neck supple. No rigidity. No  muscular tenderness.     Right lower leg: No edema.     Left lower leg: No edema.  Skin:    General: Skin is warm and dry.     Findings: No rash.  Neurological:     Mental Status: She is alert.     Comments: Alert, speech normal. Motor/sens grossly intact bil.   Psychiatric:        Mood and Affect: Mood normal.     ED Results / Procedures / Treatments   Labs (all labs ordered are listed, but only abnormal results are displayed) Results for orders placed or performed during the hospital  encounter of 11/26/22  Resp panel by RT-PCR (RSV, Flu A&B, Covid) Anterior Nasal Swab   Specimen: Anterior Nasal Swab  Result Value Ref Range   SARS Coronavirus 2 by RT PCR NEGATIVE NEGATIVE   Influenza A by PCR NEGATIVE NEGATIVE   Influenza B by PCR NEGATIVE NEGATIVE   Resp Syncytial Virus by PCR NEGATIVE NEGATIVE  CBC  Result Value Ref Range   WBC 5.8 4.0 - 10.5 K/uL   RBC 4.11 3.87 - 5.11 MIL/uL   Hemoglobin 12.8 12.0 - 15.0 g/dL   HCT 40.9 81.1 - 91.4 %   MCV 92.5 80.0 - 100.0 fL   MCH 31.1 26.0 - 34.0 pg   MCHC 33.7 30.0 - 36.0 g/dL   RDW 78.2 95.6 - 21.3 %   Platelets 200 150 - 400 K/uL   nRBC 0.0 0.0 - 0.2 %  Comprehensive metabolic panel  Result Value Ref Range   Sodium 135 135 - 145 mmol/L   Potassium 5.2 (H) 3.5 - 5.1 mmol/L   Chloride 98 98 - 111 mmol/L   CO2 24 22 - 32 mmol/L   Glucose, Bld 107 (H) 70 - 99 mg/dL   BUN 53 (H) 8 - 23 mg/dL   Creatinine, Ser 0.86 (H) 0.44 - 1.00 mg/dL   Calcium 57.8 8.9 - 46.9 mg/dL   Total Protein 7.1 6.5 - 8.1 g/dL   Albumin 4.1 3.5 - 5.0 g/dL   AST 30 15 - 41 U/L   ALT 13 0 - 44 U/L   Alkaline Phosphatase 76 38 - 126 U/L   Total Bilirubin 1.1 0.3 - 1.2 mg/dL   GFR, Estimated 32 (L) >60 mL/min   Anion gap 13 5 - 15  Brain natriuretic peptide  Result Value Ref Range   B Natriuretic Peptide 1,711.5 (H) 0.0 - 100.0 pg/mL  Urinalysis, Routine w reflex microscopic -Urine, Clean Catch  Result Value Ref Range   Color, Urine YELLOW YELLOW    APPearance CLEAR CLEAR   Specific Gravity, Urine 1.009 1.005 - 1.030   pH 8.0 5.0 - 8.0   Glucose, UA NEGATIVE NEGATIVE mg/dL   Hgb urine dipstick MODERATE (A) NEGATIVE   Bilirubin Urine NEGATIVE NEGATIVE   Ketones, ur NEGATIVE NEGATIVE mg/dL   Protein, ur NEGATIVE NEGATIVE mg/dL   Nitrite NEGATIVE NEGATIVE   Leukocytes,Ua MODERATE (A) NEGATIVE   RBC / HPF 0-5 0 - 5 RBC/hpf   WBC, UA 0-5 0 - 5 WBC/hpf   Bacteria, UA NONE SEEN NONE SEEN   Squamous Epithelial / HPF 0-5 0 - 5 /HPF  Troponin I (High Sensitivity)  Result Value Ref Range   Troponin I (High Sensitivity) 24 (H) <18 ng/L   DG Chest Port 1 View  Result Date: 11/26/2022 CLINICAL DATA:  Shortness of breath EXAM: PORTABLE CHEST 1 VIEW COMPARISON:  09/07/2022 FINDINGS: Transverse diameter of heart is increased. Aortic valve prosthesis is seen. There are no signs of alveolar pulmonary edema or focal pulmonary consolidation. There is no pleural effusion or pneumothorax. IMPRESSION: Cardiomegaly. There are no signs of pulmonary edema or focal pulmonary consolidation. Electronically Signed   By: Ernie Avena M.D.   On: 11/26/2022 13:41   ECHOCARDIOGRAM COMPLETE  Result Date: 10/28/2022    ECHOCARDIOGRAM REPORT   Patient Name:   Tara Fields Date of Exam: 10/27/2022 Medical Rec #:  [id number]          Height:       60.0 in Accession #:    [id number]  Weight:       140.2 lb Date of Birth:  [birth year]           BSA:          1.605 m Patient Age:    89 years           BP:           116/74 mmHg Patient Gender: F                  HR:           82 bpm. Exam Location:  [address] Procedure: 2D Echo, 3D Echo, Cardiac Doppler and Color Doppler Indications:    Z95.2 Status post TAVR  History:        Patient has prior history of Echocardiogram examinations, most                 recent 11/09/2021. CHF, CAD, COPD, Arrythmias:Atrial                 Fibrillation; Risk Factors:Hypertension, Diabetes, Dyslipidemia                 and  Former Smoker. Aortic Stenosis status post TAVR (July, 2020.                 26mm Edwards S3), Prior EF 20-25%.                 Aortic Valve: 26 mm Sapien prosthetic, stented (TAVR) valve is                 present in the aortic position.  Sonographer:    Farrel Conners RDCS Referring Phys: Bernadette Hoit CONTE IMPRESSIONS  1. Left ventricular ejection fraction, by estimation, is 20 to 25%. The left ventricle has severely decreased function. The left ventricle demonstrates global hypokinesis. The left ventricular internal cavity size was moderately dilated. Left ventricular diastolic parameters are indeterminate.  2. Right ventricular systolic function is normal. The right ventricular size is normal. There is mildly elevated pulmonary artery systolic pressure.  3. Left atrial size was severely dilated.  4. The mitral valve is normal in structure. Mild to moderate mitral valve regurgitation. No evidence of mitral stenosis.  5. The aortic valve has been repaired/replaced. Aortic valve regurgitation is not visualized. Mild aortic valve stenosis. There is a 26 mm Sapien prosthetic (TAVR) valve present in the aortic position. Echo findings are consistent with normal structure and function of the aortic valve prosthesis. Aortic valve area, by VTI measures 0.97 cm. Aortic valve mean gradient measures 8.2 mmHg. Aortic valve Vmax measures 1.93 m/s.  6. The inferior vena cava is normal in size with greater than 50% respiratory variability, suggesting right atrial pressure of 3 mmHg. FINDINGS  Left Ventricle: Left ventricular ejection fraction, by estimation, is 20 to 25%. The left ventricle has severely decreased function. The left ventricle demonstrates global hypokinesis. The left ventricular internal cavity size was moderately dilated. There is no left ventricular hypertrophy. Left ventricular diastolic function could not be evaluated due to atrial fibrillation. Left ventricular diastolic parameters are indeterminate. Right  Ventricle: The right ventricular size is normal. No increase in right ventricular wall thickness. Right ventricular systolic function is normal. There is mildly elevated pulmonary artery systolic pressure. The tricuspid regurgitant velocity is 2.97  m/s, and with an assumed right atrial pressure of 3 mmHg, the estimated right ventricular systolic pressure is 38.3 mmHg. Left Atrium: Left atrial size was severely dilated. Right Atrium: Right  atrial size was normal in size. Pericardium: There is no evidence of pericardial effusion. Mitral Valve: The mitral valve is normal in structure. Mild to moderate mitral valve regurgitation. No evidence of mitral valve stenosis. Tricuspid Valve: The tricuspid valve is normal in structure. Tricuspid valve regurgitation is mild . No evidence of tricuspid stenosis. Aortic Valve: The aortic valve has been repaired/replaced. Aortic valve regurgitation is not visualized. Aortic regurgitation PHT measures 439 msec. Mild aortic stenosis is present. Aortic valve mean gradient measures 8.2 mmHg. Aortic valve peak gradient  measures 15.0 mmHg. Aortic valve area, by VTI measures 0.97 cm. There is a 26 mm Sapien prosthetic, stented (TAVR) valve present in the aortic position. Echo findings are consistent with normal structure and function of the aortic valve prosthesis. Pulmonic Valve: The pulmonic valve was normal in structure. Pulmonic valve regurgitation is mild. No evidence of pulmonic stenosis. Aorta: The aortic root is normal in size and structure. Venous: The inferior vena cava is normal in size with greater than 50% respiratory variability, suggesting right atrial pressure of 3 mmHg. IAS/Shunts: No atrial level shunt detected by color flow Doppler.  LEFT VENTRICLE PLAX 2D LVIDd:         6.80 cm   Diastology LVIDs:         5.80 cm   LV e' lateral:   12.30 cm/s LV PW:         0.90 cm   LV E/e' lateral: 8.0 LV IVS:        0.90 cm LVOT diam:     2.10 cm LV SV:         36 LV SV Index:   22  LVOT Area:     3.46 cm  3D Volume EF:                          3D EF:        38 %                          LV EDV:       188 ml                          LV ESV:       117 ml                          LV SV:        71 ml RIGHT VENTRICLE RV Basal diam:  2.60 cm RV S prime:     8.39 cm/s TAPSE (M-mode): 1.2 cm RVSP:           38.3 mmHg LEFT ATRIUM              Index        RIGHT ATRIUM           Index LA diam:        5.50 cm  3.43 cm/m   RA Pressure: 3.00 mmHg LA Vol (A2C):   117.0 ml 72.90 ml/m  RA Area:     18.10 cm LA Vol (A4C):   90.0 ml  56.07 ml/m  RA Volume:   49.40 ml  30.78 ml/m LA Biplane Vol: 108.0 ml 67.29 ml/m  AORTIC VALVE AV Area (Vmax):    1.12 cm AV Area (Vmean):   1.07 cm AV Area (VTI):  0.97 cm AV Vmax:           193.40 cm/s AV Vmean:          133.200 cm/s AV VTI:            0.370 m AV Peak Grad:      15.0 mmHg AV Mean Grad:      8.2 mmHg LVOT Vmax:         62.50 cm/s LVOT Vmean:        41.167 cm/s LVOT VTI:          0.104 m LVOT/AV VTI ratio: 0.28 AI PHT:            439 msec  AORTA Ao Root diam: 2.90 cm Ao Asc diam:  3.10 cm MITRAL VALVE                 TRICUSPID VALVE MV Area (PHT): cm           TR Peak grad:   35.3 mmHg MV Decel Time: 166 msec      TR Vmax:        297.00 cm/s MR Peak grad:   93.3 mmHg    Estimated RAP:  3.00 mmHg MR Mean grad:   58.7 mmHg    RVSP:           38.3 mmHg MR Vmax:        483.00 cm/s MR Vmean:       360.3 cm/s   SHUNTS MR PISA:        3.08 cm     Systemic VTI:  0.10 m MR PISA Radius: 0.70 cm      Systemic Diam: 2.10 cm MV E velocity: 98.18 cm/s MV A velocity: 47.50 cm/s MV E/A ratio:  2.07 Chilton Si MD Electronically signed by Chilton Si MD Signature Date/Time: 10/28/2022/9:52:00 AM    Final     EKG EKG Interpretation  Date/Time:  Friday November 26 2022 13:08:53 EDT Ventricular Rate:  100 PR Interval:    QRS Duration: 170 QT Interval:  430 QTC Calculation: 555 R Axis:   -75 Text Interpretation: Atrial fibrillation Left bundle branch  block No significant change since last tracing Confirmed by Cathren Laine ([id number]) on 11/26/2022 1:16:48 PM  Radiology DG Chest Port 1 View  Result Date: 11/26/2022 CLINICAL DATA:  Shortness of breath EXAM: PORTABLE CHEST 1 VIEW COMPARISON:  09/07/2022 FINDINGS: Transverse diameter of heart is increased. Aortic valve prosthesis is seen. There are no signs of alveolar pulmonary edema or focal pulmonary consolidation. There is no pleural effusion or pneumothorax. IMPRESSION: Cardiomegaly. There are no signs of pulmonary edema or focal pulmonary consolidation. Electronically Signed   By: Ernie Avena M.D.   On: 11/26/2022 13:41    Procedures Procedures    Medications Ordered in ED Medications  sodium chloride 0.9 % bolus 500 mL (has no administration in time range)    ED Course/ Medical Decision Making/ A&P                             Medical Decision Making Problems Addressed: Confusion: chronic illness or injury    Details: Acute/chronic Hyperkalemia: acute illness or injury    Details: slight Increase in serum creatinine from prior measurement: acute illness or injury    Details: mild Memory disturbance: acute illness or injury with systemic symptoms  Amount and/or Complexity of Data Reviewed Independent Historian:     Details: Family.  External  Data Reviewed: notes. Labs: ordered. Decision-making details documented in ED Course. Radiology: ordered and independent interpretation performed. Decision-making details documented in ED Course. ECG/medicine tests: ordered and independent interpretation performed. Decision-making details documented in ED Course.  Risk Prescription drug management. Decision regarding hospitalization.   Iv ns. Continuous pulse ox and cardiac monitoring. Labs ordered/sent. Imaging ordered.   Differential diagnosis includes uti/urosepsis, dehydration, aki, chf, etc. Dispo decision including potential need for admission considered - will get labs  and imaging and reassess.   Reviewed nursing notes and prior charts for additional history. External reports reviewed. Recent palliative care visit noted (confirms dni/dnr wishes, and in general, supportive/conservative tx.  Additional history from: family.   Cardiac monitor: afib, rate 90.   Labs reviewed/interpreted by me - cr sl increased from prior, k sl high. Recent poor po intake, dry mm. NS bolus. UA pending.   Xrays reviewed/interpreted by me - no edema or pna.   Additional labs UA with nitrite neg, 0 wbc.   CT head pending.   Signed out to oncoming EDP, Dr Jearld Fenton to f/u on head ct. Likely after po/ivf, if ct neg, anticipate d/c with close pcp f/u.          Final Clinical Impression(s) / ED Diagnoses Final diagnoses:  Memory disturbance  Confusion  Increase in serum creatinine from prior measurement  Hyperkalemia    Rx / DC Orders ED Discharge Orders     None         Cathren Laine, MD 11/26/22 1458

## 2022-11-30 NOTE — Telephone Encounter (Signed)
FYI

## 2022-12-03 ENCOUNTER — Ambulatory Visit: Payer: Medicare Other | Attending: Cardiovascular Disease | Admitting: Cardiovascular Disease

## 2022-12-03 ENCOUNTER — Encounter: Payer: Self-pay | Admitting: Cardiovascular Disease

## 2022-12-03 VITALS — BP 100/70 | HR 87 | Ht 60.0 in | Wt 138.2 lb

## 2022-12-03 DIAGNOSIS — I480 Paroxysmal atrial fibrillation: Secondary | ICD-10-CM | POA: Insufficient documentation

## 2022-12-03 DIAGNOSIS — I359 Nonrheumatic aortic valve disorder, unspecified: Secondary | ICD-10-CM | POA: Insufficient documentation

## 2022-12-03 DIAGNOSIS — I10 Essential (primary) hypertension: Secondary | ICD-10-CM | POA: Insufficient documentation

## 2022-12-03 DIAGNOSIS — I2510 Atherosclerotic heart disease of native coronary artery without angina pectoris: Secondary | ICD-10-CM

## 2022-12-03 DIAGNOSIS — I6523 Occlusion and stenosis of bilateral carotid arteries: Secondary | ICD-10-CM | POA: Insufficient documentation

## 2022-12-03 DIAGNOSIS — I5022 Chronic systolic (congestive) heart failure: Secondary | ICD-10-CM | POA: Insufficient documentation

## 2022-12-03 MED ORDER — METOPROLOL SUCCINATE ER 50 MG PO TB24: 50.0000 mg | ORAL_TABLET | Freq: Two times a day (BID) | ORAL | 3 refills | Status: DC

## 2022-12-03 NOTE — Progress Notes (Signed)
Chief Complaint  Patient presents with   Follow-up    CAD, AV disease   History of Present Illness: 89 yo female with history of severe AS s/p TAVR at [facility], COPD, CAD, GERD, persistent atrial fibrillation, DM, HTN, HLD, tobacco abuse and diverticulitis here today for follow up. I saw her remotely in 2016. She re-established in my office January 2022. She underwent TAVR at [facility] 03/07/19 with placement of a 26 mm Sapien Ultra THV from the right femoral artery approach. She developed a pseudoaneurysm post TAVR and had thrombin injection. She has atrial fibrillation and is on Eliquis. She has carotid artery disease. She tells me that no stent was placed in her carotid artery. Cardiac cath March 2019 at [facility] with 25% mid LAD stenosis, 50% stenosis second diagonal branch, 50% distal Circumflex stenosis, 75% ostial stenosis of a small Diagonal branch. Echo September 2020 at [facility] with LVEF=45-50%. Moderate MR. Bioprosthetic aortic valve working well. Repeat echo at [facility] July 2021 with no change, Trivial perivalvular leak noted. She was admitted to [facility] 12/19/20 with acute systolic CHF. Echo 12/20/20 with LVEF=25-30%. No ischemic evaluation was planned per discussion with Dr. Antoine Poche and the patient. She was diuresed with IV Lasix. Norvasc was stopped. She was readmitted with CHF June 2022 after being treated with IV Lasix. She was doing well on office follow up in August 2022 but readmitted September 2022 with CHF. She was diuresed and discharged home on Torsemide. She was readmitted October 2022 with Covid and was diuresed. She was readmitted January 2023 with rapid atrial fib. Rate controlled with IV Cardizem. Mild troponin elevation due to demand ischemia. She was seen in our office in February 2023 and was doing well. She was then admitted to [facility] in March 2023 with pneumonia and volume overload. She was followed by the Advanced Heart Failure team and was rate controlled with IV amiodarone and diuresed with IV  Lasix. Echo March 2024 with LVEF=20-25%. Mild to moderate MR. AVR working well with no PVL. She was seen in the ED on 11/26/22 with confusion and was felt to have dehydration. Creatinine 1.5 which is near her baseline. K was 5.2. Aldactone was held. She is feeling much better.   She is here today for follow up. The patient denies any chest pain, dyspnea, palpitations, lower extremity edema, orthopnea, PND. Mild dizziness when standing but no near syncope or syncope. Recent cough with the pollen.   Primary Care Physician: Shirline Frees, NP  Past Medical History:  Diagnosis Date   Acute cystitis without hematuria 02/03/2022   Acute exacerbation of CHF (congestive heart failure) 12/18/2020   Acute on chronic combined systolic and diastolic CHF (congestive heart failure)    Acute on chronic systolic (congestive) heart failure 09/07/2022   AKI (acute kidney injury)    Aortic stenosis    s/p TAVR 26 mm Edwards Sapien 3 THV July 2020   Atrial fibrillation with rapid ventricular response 09/02/2021   CAD (coronary artery disease)    COPD (chronic obstructive pulmonary disease)    COVID-19 virus infection 06/07/2021   Diabetes mellitus    Diverticulitis    Elevated troponin 09/02/2021   GERD (gastroesophageal reflux disease)    GI bleed 04/13/2021   HTN (hypertension)    Hyperlipidemia    Lab test positive for detection of COVID-19 virus    Legally blind in left eye, as defined in [place]    Tobacco abuse    UTI (urinary tract infection) 11/10/2021  Past Surgical History:  Procedure Laterality Date   ABDOMINAL HYSTERECTOMY     APPENDECTOMY     BACK SURGERY     CATARACT EXTRACTION, BILATERAL     CHOLECYSTECTOMY     COLONOSCOPY WITH PROPOFOL N/A 04/16/2021   Procedure: COLONOSCOPY WITH PROPOFOL;  Surgeon: Rachael Fee, MD;  Location: [facility];  Service: Gastroenterology;  Laterality: N/A;   ESOPHAGOGASTRODUODENOSCOPY (EGD) WITH PROPOFOL N/A 04/15/2021   Procedure:  ESOPHAGOGASTRODUODENOSCOPY (EGD) WITH PROPOFOL;  Surgeon: Sherrilyn Rist, MD;  Location: [facility];  Service: Gastroenterology;  Laterality: N/A;    Current Outpatient Medications  Medication Sig Dispense Refill   acetaminophen (TYLENOL) 500 MG tablet Take 500 mg by mouth 3 (three) times daily as needed for moderate pain. Take with Tramadol     atorvastatin (LIPITOR) 40 MG tablet TAKE 1 TABLET BY MOUTH DAILY AT 6 PM. (Patient taking differently: Take 40 mg by mouth daily.) 90 tablet 1   cholecalciferol (VITAMIN D3) 25 MCG (1000 UNIT) tablet Take 1,000 Units by mouth 2 (two) times daily.     Coenzyme Q10 (CO Q-10) 300 MG CAPS Take 300 mg by mouth in the morning.     ELIQUIS 5 MG TABS tablet TAKE 1 TABLET BY MOUTH TWICE A DAY 180 tablet 1   ferrous gluconate (FERGON) 324 MG tablet TAKE 1 TABLET BY MOUTH DAILY WITH BREAKFAST 90 tablet 3   Lidocaine-Menthol, Spray, 4-1 % LIQD Apply 1 spray topically daily as needed (back/knee pain/restless legs).     Magnesium 200 MG TABS Take 200 mg by mouth every evening.     metoprolol succinate (TOPROL XL) 50 MG 24 hr tablet Take 1 tablet (50 mg total) by mouth 2 (two) times daily. Take with or immediately following a meal. 180 tablet 3   Multiple Vitamin (MULTIVITAMIN WITH MINERALS) TABS tablet Take 1 tablet by mouth daily. Centrum Minis     potassium chloride 20 MEQ/15ML (10%) SOLN TAKE 15 MLS (20 MEQ TOTAL) BY MOUTH DAILY. 90 mL 3   psyllium (METAMUCIL) 58.6 % powder Take 1 packet by mouth daily as needed (for constipation).     terazosin (HYTRIN) 10 MG capsule TAKE 1 CAPSULE BY MOUTH AT BEDTIME. 90 capsule 1   torsemide (DEMADEX) 20 MG tablet Take 2 tablets (40 mg total) by mouth daily. Incase of weight gain 2 to 3 lbs in 24 hrs or 5 lbs in 7 days, take 2 tablets twice a day until weight back to baseline. 180 tablet 1   traMADol (ULTRAM) 50 MG tablet TAKE 1 TABLET BY MOUTH THREE TIMES A DAY 90 tablet 2   vitamin B-12 (CYANOCOBALAMIN) 1000 MCG tablet  Take 1 tablet (1,000 mcg total) by mouth daily. 30 tablet 3   No current facility-administered medications for this visit.    Allergies  Allergen Reactions   Amlodipine Besy-Benazepril Hcl Cough   Hctz [Hydrochlorothiazide] Other (See Comments)    Per family, every diuretic the patient has been on flushes out her out and ultimately results in CRAMPING/JAUNDICE (added potassium might help)   Other Other (See Comments)    History of diverticulitis- CANNOT TOLERATE NUTS, CORN, AND ANY FOODS NOT EASILY DIGESTED- the patient AVOIDS these   Cyclobenzaprine Other (See Comments)    Confusion- does not tolerate well    Diclofenac Sodium Diarrhea   Hydralazine Other (See Comments)    Per family, every diuretic the patient has been on flushes out her out and ultimately results in CRAMPING/JAUNDICE (added potassium might help)  Methylprednisolone Other (See Comments)    Medrol/CORTICOSTEROIDS = A LOT OF ENERGY Delusions    Oxycodone Other (See Comments)    Patient becomes very disorientated     Social History   Socioeconomic History   Marital status: Widowed    Spouse name: Not on file   Number of children: 2   Years of education: Not on file   Highest education level: 12th grade  Occupational History   Occupation: Retired    Comment: homemaker  Tobacco Use   Smoking status: Former    Packs/day: 1.00    Years: 70.00    Additional pack years: 0.00    Total pack years: 70.00    Types: Cigarettes    Quit date: 12/15/2019    Years since quitting: 2.9   Smokeless tobacco: Never   Tobacco comments:    Pt states quiting a year ago. 01/03/20 ARJ  Vaping Use   Vaping Use: Never used  Substance and Sexual Activity   Alcohol use: No    Alcohol/week: 0.0 standard drinks of alcohol   Drug use: No   Sexual activity: Not Currently  Other Topics Concern   Not on file  Social History Narrative   Widowed- was married for 40 years    Has two sons       Social Determinants of Health    Financial Resource Strain: Low Risk  (08/14/2022)   Overall Financial Resource Strain (CARDIA)    Difficulty of Paying Living Expenses: Not hard at all  Food Insecurity: No Food Insecurity (09/09/2022)   Hunger Vital Sign    Worried About Running Out of Food in the Last Year: Never true    Ran Out of Food in the Last Year: Never true  Transportation Needs: No Transportation Needs (09/09/2022)   PRAPARE - Administrator, Civil Service (Medical): No    Lack of Transportation (Non-Medical): No  Physical Activity: Unknown (08/14/2022)   Exercise Vital Sign    Days of Exercise per Week: 0 days    Minutes of Exercise per Session: Not on file  Stress: No Stress Concern Present (08/14/2022)   [employer] of Occupational Health - Occupational Stress Questionnaire    Feeling of Stress : Only a little  Social Connections: Socially Isolated (08/14/2022)   Social Connection and Isolation Panel [NHANES]    Frequency of Communication with Friends and Family: More than three times a week    Frequency of Social Gatherings with Friends and Family: Three times a week    Attends Religious Services: Never    Active Member of Clubs or Organizations: No    Attends Banker Meetings: Not on file    Marital Status: Widowed  Intimate Partner Violence: Not At Risk (09/09/2022)   Humiliation, Afraid, Rape, and Kick questionnaire    Fear of Current or Ex-Partner: No    Emotionally Abused: No    Physically Abused: No    Sexually Abused: No    Family History  Problem Relation Age of Onset   Diabetes Mother    Stroke Brother    Cerebral palsy Brother    Coronary artery disease Neg Hx     Review of Systems:  As stated in the HPI and otherwise negative.   BP 100/70   Pulse 87   Ht 5' (1.524 m)   Wt 62.7 kg   SpO2 96%   BMI 26.99 kg/m   Physical Examination: General: Well developed, well nourished, NAD  HEENT: OP clear,  mucus membranes moist  SKIN: warm, dry. No  rashes. Neuro: No focal deficits  Musculoskeletal: Muscle strength 5/5 all ext  Psychiatric: Mood and affect normal  Neck: No JVD, no carotid bruits, no thyromegaly, no lymphadenopathy.  Lungs:Clear bilaterally, no wheezes, rhonci, crackles Cardiovascular: Irreg irreg. Soft systolic murmur.  Abdomen:Soft. Bowel sounds present. Non-tender.  Extremities: No lower extremity edema. Pulses are 2 + in the bilateral DP/PT.  EKG:  EKG is not ordered today. The ekg ordered today demonstrates   Echo March 2024:  1. Left ventricular ejection fraction, by estimation, is 20 to 25%. The  left ventricle has severely decreased function. The left ventricle  demonstrates global hypokinesis. The left ventricular internal cavity size  was moderately dilated. Left  ventricular diastolic parameters are indeterminate.   2. Right ventricular systolic function is normal. The right ventricular  size is normal. There is mildly elevated pulmonary artery systolic  pressure.   3. Left atrial size was severely dilated.   4. The mitral valve is normal in structure. Mild to moderate mitral valve  regurgitation. No evidence of mitral stenosis.   5. The aortic valve has been repaired/replaced. Aortic valve  regurgitation is not visualized. Mild aortic valve stenosis. There is a 26  mm Sapien prosthetic (TAVR) valve present in the aortic position. Echo  findings are consistent with normal structure  and function of the aortic valve prosthesis. Aortic valve area, by VTI  measures 0.97 cm. Aortic valve mean gradient measures 8.2 mmHg. Aortic  valve Vmax measures 1.93 m/s.   6. The inferior vena cava is normal in size with greater than 50%  respiratory variability, suggesting right atrial pressure of 3 mmHg.    Recent Labs: 09/09/2022: Magnesium 2.8 11/26/2022: ALT 13; B Natriuretic Peptide 1,711.5; BUN 53; Creatinine, Ser 1.53; Hemoglobin 12.8; Platelets 200; Potassium 5.2; Sodium 135   Lipid Panel    Component  Value Date/Time   CHOL 94 05/08/2021 0234   TRIG 57 05/08/2021 0234   HDL 34 (L) 05/08/2021 0234   CHOLHDL 2.8 05/08/2021 0234   VLDL 11 05/08/2021 0234   LDLCALC 49 05/08/2021 0234     Wt Readings from Last 3 Encounters:  12/03/22 62.7 kg  11/26/22 63.6 kg  10/01/22 63.6 kg     Assessment and Plan:   1. Severe aortic stenosis s/p TAVR: TAVR was performed at [facility]. Valve working well by echo March 2024. She is on Eliquis for atrial fib. SBE prophylaxis when indicated.Continue Eliquis and SBE prophylaxis.  Will stop ASA  2. CAD without angina: She has no chest pain suggestive of angina. Mild to moderate CAD by cath in 2019. Drop in LVEF noted on echo May 2022 with LVEF=25-30%. Given her age, we will not plan ischemic testing in the future. Will continue statin and beta blocker.   3. Paroxysmal atrial fibrillation: Atrial fib today on exam today with good rate control. Continue beta blocker and Eliquis.      4. HTN: BP is soft today. Will not restart aldactone. Continue Toprol. Check BMET today  5. Non-ischemic cardiomyopathy/Chronic systolic CHF: : LVEF=20-25% by echo March 2024, unchanged over the past two years.  We have not planned an ischemic evaluation given advanced age. We have discussed this and she does not wish to have any ischemic evaluation. Weight is stable. Continue beta blocker aldactone and Torsemide.   6. Carotid artery disease: Notes indicate right carotid artery stenting. She denies this. No plans for further surveillance given age.   Labs/  tests ordered today include:   Orders Placed This Encounter  Procedures   Basic metabolic panel   Disposition:   F/U with me or office APP in 6 months.   Signed, Verne Carrow, MD 12/03/2022 10:07 AM    [facility] [address] [place], [place], [place]  [address] Phone: [phone]; Fax: [phone]

## 2022-12-03 NOTE — Patient Instructions (Signed)
Medication Instructions:  Your physician has recommended you make the following change in your medication:   1) STOP spironolactone (Aldactone) 2) CHANGE metoprolol succinate (Toprol XL) to  twice daily  *If you need a refill on your cardiac medications before your next appointment, please call your pharmacy*  Lab Work: TODAY: BMET If you have labs (blood work) drawn today and your tests are completely normal, you will receive your results only by: MyChart Message (if you have MyChart) OR A paper copy in the mail If you have any lab test that is abnormal or we need to change your treatment, we will call you to review the results.  Testing/Procedures: None  Follow-Up: At [facility], you and your health needs are our priority.  As part of our continuing mission to provide you with exceptional heart care, we have created designated Provider Care Teams.  These Care Teams include your primary Cardiologist (physician) and Advanced Practice Providers (APPs -  Physician Assistants and Nurse Practitioners) who all work together to provide you with the care you need, when you need it.  Your next appointment:   6 month(s)  Provider:   Verne Carrow, MD

## 2022-12-04 LAB — BASIC METABOLIC PANEL
BUN/Creatinine Ratio: 37 — ABNORMAL HIGH (ref 12–28)
BUN: 44 mg/dL — ABNORMAL HIGH (ref 10–36)
CO2: 21 mmol/L (ref 20–29)
Calcium: 9.2 mg/dL (ref 8.7–10.3)
Chloride: 101 mmol/L (ref 96–106)
Creatinine, Ser: 1.2 mg/dL — ABNORMAL HIGH (ref 0.57–1.00)
Glucose: 194 mg/dL — ABNORMAL HIGH (ref 70–99)
Potassium: 3.9 mmol/L (ref 3.5–5.2)
Sodium: 139 mmol/L (ref 134–144)
eGFR: 42 mL/min/{1.73_m2} — ABNORMAL LOW (ref >59)

## 2022-12-07 ENCOUNTER — Ambulatory Visit (INDEPENDENT_AMBULATORY_CARE_PROVIDER_SITE_OTHER): Payer: Medicare Other | Admitting: Adult Health

## 2022-12-07 VITALS — BP 100/60 | HR 70 | Temp 97.0°F

## 2022-12-07 DIAGNOSIS — R4189 Other symptoms and signs involving cognitive functions and awareness: Secondary | ICD-10-CM | POA: Diagnosis not present

## 2022-12-07 DIAGNOSIS — E1169 Type 2 diabetes mellitus with other specified complication: Secondary | ICD-10-CM

## 2022-12-07 DIAGNOSIS — F05 Delirium due to known physiological condition: Secondary | ICD-10-CM

## 2022-12-07 MED ORDER — DONEPEZIL HCL 5 MG PO TABS
5.0000 mg | ORAL_TABLET | Freq: Every day | ORAL | 1 refills | Status: DC
Start: 2022-12-07 — End: 2022-12-16

## 2022-12-07 NOTE — Progress Notes (Signed)
Subjective:    Patient ID: Tara Fields, female    DOB: Mar 25, 1928, 89 y.o.   MRN: [id number]  HPI 89 year old female who  has a past medical history of Acute cystitis without hematuria (02/03/2022), Acute exacerbation of CHF (congestive heart failure) (12/18/2020), Acute on chronic combined systolic and diastolic CHF (congestive heart failure), Acute on chronic systolic (congestive) heart failure (09/07/2022), AKI (acute kidney injury), Aortic stenosis, Atrial fibrillation with rapid ventricular response (09/02/2021), CAD (coronary artery disease), COPD (chronic obstructive pulmonary disease), COVID-19 virus infection (06/07/2021), Diabetes mellitus, Diverticulitis, Elevated troponin (09/02/2021), GERD (gastroesophageal reflux disease), GI bleed (04/13/2021), HTN (hypertension), Hyperlipidemia, Lab test positive for detection of COVID-19 virus, Legally blind in left eye, as defined in [place], Tobacco abuse, and UTI (urinary tract infection) (11/10/2021).  She is presenting to the office today with her son, [name] for an ER follow up   She was seen ER about 12 days ago for increased confusion and memory issues.  Metabolic workup was unremarkable except for AKI. UA showed no signs of UTI.  Spironolactone was held because of likely dehydration.  CT of the head showed no acute intracranial abnormality but did show age-related atrophy and chronic small vessel ischemic changes.  This time she had 1 to 2 weeks of increased confusion including trouble distinguishing between day and night hours making some confused statements, and also noted increased work of breathing.  She does live at [facility] assisted living facility   She never took her Abilify   Today she reports that she does get her times mixed up but it is because she goes to bed so early and wakes up early and it is light out when she goes to bed and when she wakes up.   She also needs a new podiatrists to help with diabetic foot  care     Review of Systems See HPI   Past Medical History:  Diagnosis Date   Acute cystitis without hematuria 02/03/2022   Acute exacerbation of CHF (congestive heart failure) 12/18/2020   Acute on chronic combined systolic and diastolic CHF (congestive heart failure)    Acute on chronic systolic (congestive) heart failure 09/07/2022   AKI (acute kidney injury)    Aortic stenosis    s/p TAVR 26 mm Edwards Sapien 3 THV July 2020   Atrial fibrillation with rapid ventricular response 09/02/2021   CAD (coronary artery disease)    COPD (chronic obstructive pulmonary disease)    COVID-19 virus infection 06/07/2021   Diabetes mellitus    Diverticulitis    Elevated troponin 09/02/2021   GERD (gastroesophageal reflux disease)    GI bleed 04/13/2021   HTN (hypertension)    Hyperlipidemia    Lab test positive for detection of COVID-19 virus    Legally blind in left eye, as defined in [place]    Tobacco abuse    UTI (urinary tract infection) 11/10/2021    Social History   Socioeconomic History   Marital status: Widowed    Spouse name: Not on file   Number of children: 2   Years of education: Not on file   Highest education level: 12th grade  Occupational History   Occupation: Retired    Comment: homemaker  Tobacco Use   Smoking status: Former    Packs/day: 1.00    Years: 70.00    Additional pack years: 0.00    Total pack years: 70.00    Types: Cigarettes    Quit date: 12/15/2019    Years since  quitting: 2.9   Smokeless tobacco: Never   Tobacco comments:    Pt states quiting a year ago. 01/03/20 ARJ  Vaping Use   Vaping Use: Never used  Substance and Sexual Activity   Alcohol use: No    Alcohol/week: 0.0 standard drinks of alcohol   Drug use: No   Sexual activity: Not Currently  Other Topics Concern   Not on file  Social History Narrative   Widowed- was married for 40 years    Has two sons       Social Determinants of Health   Financial Resource Strain: Low Risk   (08/14/2022)   Overall Financial Resource Strain (CARDIA)    Difficulty of Paying Living Expenses: Not hard at all  Food Insecurity: No Food Insecurity (09/09/2022)   Hunger Vital Sign    Worried About Running Out of Food in the Last Year: Never true    Ran Out of Food in the Last Year: Never true  Transportation Needs: No Transportation Needs (09/09/2022)   PRAPARE - Administrator, Civil Service (Medical): No    Lack of Transportation (Non-Medical): No  Physical Activity: Unknown (08/14/2022)   Exercise Vital Sign    Days of Exercise per Week: 0 days    Minutes of Exercise per Session: Not on file  Stress: No Stress Concern Present (08/14/2022)   [employer] of Occupational Health - Occupational Stress Questionnaire    Feeling of Stress : Only a little  Social Connections: Socially Isolated (08/14/2022)   Social Connection and Isolation Panel [NHANES]    Frequency of Communication with Friends and Family: More than three times a week    Frequency of Social Gatherings with Friends and Family: Three times a week    Attends Religious Services: Never    Active Member of Clubs or Organizations: No    Attends Banker Meetings: Not on file    Marital Status: Widowed  Intimate Partner Violence: Not At Risk (09/09/2022)   Humiliation, Afraid, Rape, and Kick questionnaire    Fear of Current or Ex-Partner: No    Emotionally Abused: No    Physically Abused: No    Sexually Abused: No    Past Surgical History:  Procedure Laterality Date   ABDOMINAL HYSTERECTOMY     APPENDECTOMY     BACK SURGERY     CATARACT EXTRACTION, BILATERAL     CHOLECYSTECTOMY     COLONOSCOPY WITH PROPOFOL N/A 04/16/2021   Procedure: COLONOSCOPY WITH PROPOFOL;  Surgeon: Rachael Fee, MD;  Location: [facility];  Service: Gastroenterology;  Laterality: N/A;   ESOPHAGOGASTRODUODENOSCOPY (EGD) WITH PROPOFOL N/A 04/15/2021   Procedure: ESOPHAGOGASTRODUODENOSCOPY (EGD) WITH PROPOFOL;   Surgeon: Sherrilyn Rist, MD;  Location: [facility];  Service: Gastroenterology;  Laterality: N/A;    Family History  Problem Relation Age of Onset   Diabetes Mother    Stroke Brother    Cerebral palsy Brother    Coronary artery disease Neg Hx     Allergies  Allergen Reactions   Amlodipine Besy-Benazepril Hcl Cough   Hctz [Hydrochlorothiazide] Other (See Comments)    Per family, every diuretic the patient has been on flushes out her out and ultimately results in CRAMPING/JAUNDICE (added potassium might help)   Other Other (See Comments)    History of diverticulitis- CANNOT TOLERATE NUTS, CORN, AND ANY FOODS NOT EASILY DIGESTED- the patient AVOIDS these   Cyclobenzaprine Other (See Comments)    Confusion- does not tolerate well  Diclofenac Sodium Diarrhea   Hydralazine Other (See Comments)    Per family, every diuretic the patient has been on flushes out her out and ultimately results in CRAMPING/JAUNDICE (added potassium might help)   Methylprednisolone Other (See Comments)    Medrol/CORTICOSTEROIDS = A LOT OF ENERGY Delusions    Oxycodone Other (See Comments)    Patient becomes very disorientated     Current Outpatient Medications on File Prior to Visit  Medication Sig Dispense Refill   acetaminophen (TYLENOL) 500 MG tablet Take 500 mg by mouth 3 (three) times daily as needed for moderate pain. Take with Tramadol     atorvastatin (LIPITOR) 40 MG tablet TAKE 1 TABLET BY MOUTH DAILY AT 6 PM. (Patient taking differently: Take 40 mg by mouth daily.) 90 tablet 1   cholecalciferol (VITAMIN D3) 25 MCG (1000 UNIT) tablet Take 1,000 Units by mouth 2 (two) times daily.     Coenzyme Q10 (CO Q-10) 300 MG CAPS Take 300 mg by mouth in the morning.     ELIQUIS 5 MG TABS tablet TAKE 1 TABLET BY MOUTH TWICE A DAY 180 tablet 1   ferrous gluconate (FERGON) 324 MG tablet TAKE 1 TABLET BY MOUTH DAILY WITH BREAKFAST 90 tablet 3   Lidocaine-Menthol, Spray, 4-1 % LIQD Apply 1 spray topically  daily as needed (back/knee pain/restless legs).     Magnesium 200 MG TABS Take 200 mg by mouth every evening.     metoprolol succinate (TOPROL XL) 50 MG 24 hr tablet Take 1 tablet (50 mg total) by mouth 2 (two) times daily. Take with or immediately following a meal. 180 tablet 3   Multiple Vitamin (MULTIVITAMIN WITH MINERALS) TABS tablet Take 1 tablet by mouth daily. Centrum Minis     potassium chloride 20 MEQ/15ML (10%) SOLN TAKE 15 MLS (20 MEQ TOTAL) BY MOUTH DAILY. 90 mL 3   psyllium (METAMUCIL) 58.6 % powder Take 1 packet by mouth daily as needed (for constipation).     terazosin (HYTRIN) 10 MG capsule TAKE 1 CAPSULE BY MOUTH AT BEDTIME. 90 capsule 1   torsemide (DEMADEX) 20 MG tablet Take 2 tablets (40 mg total) by mouth daily. Incase of weight gain 2 to 3 lbs in 24 hrs or 5 lbs in 7 days, take 2 tablets twice a day until weight back to baseline. 180 tablet 1   traMADol (ULTRAM) 50 MG tablet TAKE 1 TABLET BY MOUTH THREE TIMES A DAY 90 tablet 2   vitamin B-12 (CYANOCOBALAMIN) 1000 MCG tablet Take 1 tablet (1,000 mcg total) by mouth daily. 30 tablet 3   No current facility-administered medications on file prior to visit.    BP 100/60   Pulse 70   Temp (!) 97 F (36.1 C) (Oral)   SpO2 93%       Objective:   Physical Exam Vitals and nursing note reviewed.  Constitutional:      Appearance: Normal appearance.  Cardiovascular:     Rate and Rhythm: Normal rate and regular rhythm.     Pulses: Normal pulses.     Heart sounds: Normal heart sounds.  Pulmonary:     Effort: Pulmonary effort is normal.     Breath sounds: Normal breath sounds.  Skin:    General: Skin is warm and dry.  Neurological:     General: No focal deficit present.     Mental Status: She is alert and oriented to person, place, and time.  Psychiatric:        Mood and Affect:  Mood normal.        Behavior: Behavior normal.        Thought Content: Thought content normal.        Judgment: Judgment normal.        Assessment & Plan:  1. Cognitive deficits - Her son bought her a clock that tells her if it is morning, afternoon, or evening in an attempt to help her.  - We will see if she is willing to take Aricept at bedtime  - donepezil (ARICEPT) 5 MG tablet; Take 1 tablet (5 mg total) by mouth at bedtime.  Dispense: 30 tablet; Refill: 1  2. Sundowning  - donepezil (ARICEPT) 5 MG tablet; Take 1 tablet (5 mg total) by mouth at bedtime.  Dispense: 30 tablet; Refill: 1  3. Type 2 diabetes mellitus with other specified complication, without long-term current use of insulin  - Ambulatory referral to Podiatry  Shirline Frees, NP

## 2022-12-11 ENCOUNTER — Emergency Department (HOSPITAL_COMMUNITY): Payer: Medicare Other

## 2022-12-11 ENCOUNTER — Other Ambulatory Visit: Payer: Self-pay

## 2022-12-11 ENCOUNTER — Emergency Department (HOSPITAL_COMMUNITY)
Admission: EM | Admit: 2022-12-11 | Discharge: 2022-12-11 | Disposition: A | Discharge status: Home / Self Care | Payer: Medicare Other | Source: Home / Self Care | Attending: Emergency Medicine | Admitting: Emergency Medicine

## 2022-12-11 ENCOUNTER — Encounter (HOSPITAL_COMMUNITY): Payer: Self-pay

## 2022-12-11 DIAGNOSIS — Y92039 Unspecified place in apartment as the place of occurrence of the external cause: Secondary | ICD-10-CM | POA: Insufficient documentation

## 2022-12-11 DIAGNOSIS — N189 Chronic kidney disease, unspecified: Secondary | ICD-10-CM | POA: Insufficient documentation

## 2022-12-11 DIAGNOSIS — W19XXXA Unspecified fall, initial encounter: Secondary | ICD-10-CM | POA: Insufficient documentation

## 2022-12-11 DIAGNOSIS — I509 Heart failure, unspecified: Secondary | ICD-10-CM | POA: Insufficient documentation

## 2022-12-11 DIAGNOSIS — M542 Cervicalgia: Secondary | ICD-10-CM | POA: Insufficient documentation

## 2022-12-11 DIAGNOSIS — Z79899 Other long term (current) drug therapy: Secondary | ICD-10-CM | POA: Insufficient documentation

## 2022-12-11 DIAGNOSIS — J449 Chronic obstructive pulmonary disease, unspecified: Secondary | ICD-10-CM | POA: Insufficient documentation

## 2022-12-11 DIAGNOSIS — I130 Hypertensive heart and chronic kidney disease with heart failure and stage 1 through stage 4 chronic kidney disease, or unspecified chronic kidney disease: Secondary | ICD-10-CM | POA: Insufficient documentation

## 2022-12-11 DIAGNOSIS — S0003XA Contusion of scalp, initial encounter: Secondary | ICD-10-CM | POA: Insufficient documentation

## 2022-12-11 DIAGNOSIS — R109 Unspecified abdominal pain: Secondary | ICD-10-CM | POA: Insufficient documentation

## 2022-12-11 DIAGNOSIS — Y900 Blood alcohol level of less than 20 mg/100 ml: Secondary | ICD-10-CM | POA: Insufficient documentation

## 2022-12-11 DIAGNOSIS — E1122 Type 2 diabetes mellitus with diabetic chronic kidney disease: Secondary | ICD-10-CM | POA: Insufficient documentation

## 2022-12-11 DIAGNOSIS — I63411 Cerebral infarction due to embolism of right middle cerebral artery: Secondary | ICD-10-CM | POA: Diagnosis not present

## 2022-12-11 DIAGNOSIS — Z7901 Long term (current) use of anticoagulants: Secondary | ICD-10-CM | POA: Insufficient documentation

## 2022-12-11 LAB — COMPREHENSIVE METABOLIC PANEL
ALT: 17 U/L (ref 0–44)
AST: 27 U/L (ref 15–41)
Albumin: 3.6 g/dL (ref 3.5–5.0)
Alkaline Phosphatase: 72 U/L (ref 38–126)
Anion gap: 13 (ref 5–15)
BUN: 39 mg/dL — ABNORMAL HIGH (ref 8–23)
CO2: 24 mmol/L (ref 22–32)
Calcium: 9.5 mg/dL (ref 8.9–10.3)
Chloride: 101 mmol/L (ref 98–111)
Creatinine, Ser: 1.29 mg/dL — ABNORMAL HIGH (ref 0.44–1.00)
GFR, Estimated: 39 mL/min — ABNORMAL LOW (ref >60)
Glucose, Bld: 142 mg/dL — ABNORMAL HIGH (ref 70–99)
Potassium: 4.4 mmol/L (ref 3.5–5.1)
Sodium: 138 mmol/L (ref 135–145)
Total Bilirubin: 0.9 mg/dL (ref 0.3–1.2)
Total Protein: 6.7 g/dL (ref 6.5–8.1)

## 2022-12-11 LAB — CK: Total CK: 108 U/L (ref 38–234)

## 2022-12-11 LAB — I-STAT CHEM 8, ED
BUN: 35 mg/dL — ABNORMAL HIGH (ref 8–23)
Calcium, Ion: 1.17 mmol/L (ref 1.15–1.40)
Chloride: 104 mmol/L (ref 98–111)
Creatinine, Ser: 1.3 mg/dL — ABNORMAL HIGH (ref 0.44–1.00)
Glucose, Bld: 138 mg/dL — ABNORMAL HIGH (ref 70–99)
HCT: 35 % — ABNORMAL LOW (ref 36.0–46.0)
Hemoglobin: 11.9 g/dL — ABNORMAL LOW (ref 12.0–15.0)
Potassium: 4.2 mmol/L (ref 3.5–5.1)
Sodium: 139 mmol/L (ref 135–145)
TCO2: 25 mmol/L (ref 22–32)

## 2022-12-11 LAB — CBC
HCT: 36.5 % (ref 36.0–46.0)
Hemoglobin: 12.2 g/dL (ref 12.0–15.0)
MCH: 31.4 pg (ref 26.0–34.0)
MCHC: 33.4 g/dL (ref 30.0–36.0)
MCV: 94.1 fL (ref 80.0–100.0)
Platelets: 158 10*3/uL (ref 150–400)
RBC: 3.88 MIL/uL (ref 3.87–5.11)
RDW: 14.9 % (ref 11.5–15.5)
WBC: 7.3 10*3/uL (ref 4.0–10.5)
nRBC: 0 % (ref 0.0–0.2)

## 2022-12-11 LAB — LACTIC ACID, PLASMA: Lactic Acid, Venous: 1.2 mmol/L (ref 0.5–1.9)

## 2022-12-11 LAB — PROTIME-INR
INR: 1.7 — ABNORMAL HIGH (ref 0.8–1.2)
Prothrombin Time: 19.4 seconds — ABNORMAL HIGH (ref 11.4–15.2)

## 2022-12-11 LAB — TROPONIN I (HIGH SENSITIVITY)
Troponin I (High Sensitivity): 18 ng/L — ABNORMAL HIGH (ref <18)
Troponin I (High Sensitivity): 20 ng/L — ABNORMAL HIGH (ref <18)

## 2022-12-11 LAB — ETHANOL: Alcohol, Ethyl (B): <10 mg/dL (ref <10)

## 2022-12-11 MED ORDER — LACTATED RINGERS IV BOLUS
500.0000 mL | Freq: Once | INTRAVENOUS | Status: AC
  Administered 2022-12-11: 500 mL via INTRAVENOUS

## 2022-12-11 MED ORDER — LORAZEPAM 2 MG/ML IJ SOLN
1.0000 mg | Freq: Once | INTRAMUSCULAR | Status: AC
  Administered 2022-12-11: 1 mg via INTRAVENOUS
  Filled 2022-12-11: qty 1

## 2022-12-11 MED ORDER — FENTANYL CITRATE PF 50 MCG/ML IJ SOSY
25.0000 ug | PREFILLED_SYRINGE | Freq: Once | INTRAMUSCULAR | Status: AC
  Administered 2022-12-11: 25 ug via INTRAVENOUS
  Filled 2022-12-11: qty 1

## 2022-12-11 MED ORDER — SODIUM CHLORIDE 0.9 % IV BOLUS
250.0000 mL | Freq: Once | INTRAVENOUS | Status: AC
  Administered 2022-12-11: 250 mL via INTRAVENOUS

## 2022-12-11 MED ORDER — ACETAMINOPHEN 325 MG PO TABS
650.0000 mg | ORAL_TABLET | Freq: Once | ORAL | Status: AC
  Administered 2022-12-11: 650 mg via ORAL
  Filled 2022-12-11: qty 2

## 2022-12-11 NOTE — ED Notes (Signed)
C-collar removed at this time.

## 2022-12-11 NOTE — ED Triage Notes (Signed)
Pt BIB EMS from [facility]. Per EMS, pt had unwitnessed fall in her apartment. Hematoma noted to back of head. No obvious injuries or bleeding. Pt denies LOC. On elaquis. A/Ox4.

## 2022-12-11 NOTE — ED Provider Notes (Signed)
[facility] AT [facility], [place] Provider Note   CSN: [id number] Arrival date & time: 12/11/22  1253     History  Chief Complaint  Patient presents with   Tara Fields    Tara Fields is a 89 y.o. female.   Fall  Patient presents after a fall.  She resides in an independent living facility.  Sometime early this morning, patient was ambulating in her apartment.  She states that she does have intermittent episodes of dizziness.  She had an episode of dizziness which caused her knee to give out and she stumbled and bumped into a doorway.  She then fell and landed on the hardwood floor.  She was unable to get up off of the floor on her own.  She states that this is not uncommon for her.  She laid there on the floor until someone found her.  When they found her, she was laying on her back.  She was noted to have a hematoma to the back of her head.  She is on Eliquis and does state that she took her dose this morning.  With EMS, patient was complaining of diffuse body pains.  She was able to stand and bear weight with EMS.  Patient currently endorses neck pain.  She denies any other current areas of new discomfort.     Home Medications Prior to Admission medications   Medication Sig Start Date End Date Taking? Authorizing Provider  acetaminophen (TYLENOL) 500 MG tablet Take 500 mg by mouth 3 (three) times daily as needed for moderate pain. Take with Tramadol   Yes [provider]  atorvastatin (LIPITOR) 40 MG tablet TAKE 1 TABLET BY MOUTH DAILY AT 6 PM. Patient taking differently: Take 40 mg by mouth daily. 06/18/22  Yes Nafziger, Kandee Keen, NP  cholecalciferol (VITAMIN D3) 25 MCG (1000 UNIT) tablet Take 1,000 Units by mouth daily.   Yes [provider]  Coenzyme Q10 (CO Q-10) 300 MG CAPS Take 300 mg by mouth in the morning.   Yes [provider]  ELIQUIS 5 MG TABS tablet TAKE 1 TABLET BY MOUTH TWICE A DAY 09/01/22  Yes Nafziger, Kandee Keen, NP  ferrous  gluconate (FERGON) 324 MG tablet TAKE 1 TABLET BY MOUTH DAILY WITH BREAKFAST 09/21/22  Yes Nafziger, Kandee Keen, NP  fluticasone (FLONASE) 50 MCG/ACT nasal spray Place 2 sprays into both nostrils daily as needed for allergies or rhinitis.   Yes [provider]  Lidocaine-Menthol, Spray, 4-1 % LIQD Apply 1 spray topically daily as needed (back/knee pain/restless legs).   Yes [provider]  Magnesium 200 MG TABS Take 200 mg by mouth every other day.   Yes [provider]  metoprolol succinate (TOPROL XL) 50 MG 24 hr tablet Take 1 tablet (50 mg total) by mouth 2 (two) times daily. Take with or immediately following a meal. 12/03/22  Yes McAlhany, Nile Dear, MD  Misc Natural Products (OSTEO BI-FLEX ADV JOINT SHIELD PO) Take 1 tablet by mouth daily.   Yes [provider]  Multiple Vitamins-Minerals (CENTRUM MINIS WOMEN 50+ PO) Take 1 tablet by mouth daily.   Yes [provider]  Omega-3 Fatty Acids (FISH OIL) 1200 MG CAPS Take 1 capsule by mouth daily.   Yes [provider]  ondansetron (ZOFRAN) 4 MG tablet Take 4 mg by mouth every 6 (six) hours as needed for nausea or vomiting.   Yes [provider]  potassium chloride 20 MEQ/15ML (10%) SOLN TAKE 15 MLS (20 MEQ TOTAL)  BY MOUTH DAILY. Patient taking differently: Take 20 mEq by mouth daily. 10ml 09/16/22 12/15/22 Yes Dyann Kief, PA-C  psyllium (METAMUCIL) 58.6 % powder Take 1 packet by mouth daily as needed (for constipation).   Yes [provider]  QUEtiapine (SEROQUEL) 25 MG tablet Take 12.5 mg by mouth at bedtime.   Yes [provider]  sodium chloride (OCEAN) 0.65 % SOLN nasal spray Place 1 spray into both nostrils as needed for congestion.   Yes [provider]  spironolactone (ALDACTONE) 25 MG tablet Take 12.5 mg by mouth daily.   Yes [provider]  terazosin (HYTRIN) 10 MG capsule TAKE 1 CAPSULE BY MOUTH AT BEDTIME. 05/11/22  Yes Nafziger, Kandee Keen, NP   torsemide (DEMADEX) 20 MG tablet Take 2 tablets (40 mg total) by mouth daily. Incase of weight gain 2 to 3 lbs in 24 hrs or 5 lbs in 7 days, take 2 tablets twice a day until weight back to baseline. Patient taking differently: Take 20 mg by mouth 2 (two) times daily. Incase of weight gain 2 to 3 lbs in 24 hrs or 5 lbs in 7 days, take 2 tablets twice a day until weight back to baseline. 09/09/22  Yes Arrien, York Ram, MD  traMADol (ULTRAM) 50 MG tablet TAKE 1 TABLET BY MOUTH THREE TIMES A DAY 11/03/22  Yes Nafziger, Kandee Keen, NP  traZODone (DESYREL) 50 MG tablet Take 25 mg by mouth at bedtime as needed for sleep.   Yes [provider]  vitamin B-12 (CYANOCOBALAMIN) 1000 MCG tablet Take 1 tablet (1,000 mcg total) by mouth daily. 04/16/21  Yes Lama, Sarina Ill, MD  donepezil (ARICEPT) 5 MG tablet Take 1 tablet (5 mg total) by mouth at bedtime. Patient not taking: Reported on 12/11/2022 12/07/22   Shirline Frees, NP      Allergies    Amlodipine besy-benazepril hcl, Hctz [hydrochlorothiazide], Other, Cyclobenzaprine, Diclofenac sodium, Hydralazine, Methylprednisolone, and Oxycodone    Review of Systems   Review of Systems  Musculoskeletal:  Positive for myalgias and neck pain.  All other systems reviewed and are negative.   Physical Exam Updated Vital Signs BP 122/74   Pulse 79   Temp 97.8 F (36.6 C)   Resp 15   Ht 5' (1.524 m)   Wt 62.7 kg   SpO2 100%   BMI 27.00 kg/m  Physical Exam Vitals and nursing note reviewed.  Constitutional:      General: She is not in acute distress.    Appearance: Normal appearance. She is well-developed. She is not ill-appearing, toxic-appearing or diaphoretic.  HENT:     Head: Normocephalic.     Comments: Hematoma to left occiput    Right Ear: External ear normal.     Left Ear: External ear normal.     Nose: Nose normal.     Mouth/Throat:     Mouth: Mucous membranes are dry.  Eyes:     Extraocular Movements: Extraocular movements intact.      Conjunctiva/sclera: Conjunctivae normal.  Neck:     Comments: Cervical collar in place Cardiovascular:     Rate and Rhythm: Normal rate and regular rhythm.     Heart sounds: No murmur heard. Pulmonary:     Effort: Pulmonary effort is normal. No respiratory distress.     Breath sounds: Normal breath sounds. No wheezing or rales.  Chest:     Chest wall: No tenderness.  Abdominal:     General: There is no distension.     Palpations:  Abdomen is soft.     Tenderness: There is no abdominal tenderness.  Musculoskeletal:        General: No swelling. Normal range of motion.     Right lower leg: No edema.     Left lower leg: No edema.  Skin:    General: Skin is warm and dry.     Coloration: Skin is not jaundiced or pale.  Neurological:     General: No focal deficit present.     Mental Status: She is alert and oriented to person, place, and time.     Cranial Nerves: No cranial nerve deficit.     Sensory: No sensory deficit.     Motor: No weakness.     Coordination: Coordination normal.  Psychiatric:        Mood and Affect: Mood normal.        Behavior: Behavior normal.        Thought Content: Thought content normal.        Judgment: Judgment normal.     ED Results / Procedures / Treatments   Labs (all labs ordered are listed, but only abnormal results are displayed) Labs Reviewed  COMPREHENSIVE METABOLIC PANEL - Abnormal; Notable for the following components:      Result Value   Glucose, Bld 142 (*)    BUN 39 (*)    Creatinine, Ser 1.29 (*)    GFR, Estimated 39 (*)    All other components within normal limits  PROTIME-INR - Abnormal; Notable for the following components:   Prothrombin Time 19.4 (*)    INR 1.7 (*)    All other components within normal limits  I-STAT CHEM 8, ED - Abnormal; Notable for the following components:   BUN 35 (*)    Creatinine, Ser 1.30 (*)    Glucose, Bld 138 (*)    Hemoglobin 11.9 (*)    HCT 35.0 (*)    All other components within normal  limits  TROPONIN I (HIGH SENSITIVITY) - Abnormal; Notable for the following components:   Troponin I (High Sensitivity) 18 (*)    All other components within normal limits  CBC  ETHANOL  LACTIC ACID, PLASMA  CK  URINALYSIS, ROUTINE W REFLEX MICROSCOPIC  TROPONIN I (HIGH SENSITIVITY)    EKG None  Radiology DG Pelvis Portable  Result Date: 12/11/2022 CLINICAL DATA:  Trauma. EXAM: PORTABLE PELVIS 1-2 VIEWS COMPARISON:  None Available. FINDINGS: There is no evidence of pelvic fracture or diastasis. No pelvic bone lesions are seen. Severe osteoarthritic changes of the right hip, moderate osteoarthritic changes of the left hip. Vascular calcifications noted. Lower lumbosacral spine fusion. IMPRESSION: 1. No acute fracture or diastasis of the pelvis. 2. Severe osteoarthritic changes of the right hip, moderate osteoarthritic changes of the left hip. Electronically Signed   By: Ted Mcalpine M.D.   On: 12/11/2022 13:28   DG Chest Port 1 View  Result Date: 12/11/2022 CLINICAL DATA:  Fall. EXAM: PORTABLE CHEST 1 VIEW COMPARISON:  11/26/2022 FINDINGS: The cardio pericardial silhouette is enlarged. Status post TAVR. Interstitial markings are diffusely coarsened with chronic features. Streaky left parahilar density is likely atelectasis and mildly progressive in the interval. Bones are diffusely demineralized. IMPRESSION: Streaky left parahilar density is likely atelectasis and mildly progressive in the interval. No evidence for pneumothorax or pleural effusion. Electronically Signed   By: Kennith Center M.D.   On: 12/11/2022 13:27    Procedures Procedures    Medications Ordered in ED Medications  acetaminophen (TYLENOL) tablet 650  mg (650 mg Oral Given 12/11/22 1348)  fentaNYL (SUBLIMAZE) injection 25 mcg (25 mcg Intravenous Given 12/11/22 1520)    ED Course/ Medical Decision Making/ A&P                             Medical Decision Making Amount and/or Complexity of Data Reviewed Labs:  ordered. Radiology: ordered.  Risk OTC drugs. Prescription drug management.   This patient presents to the ED for concern of fall, this involves an extensive number of treatment options, and is a complaint that carries with it a high risk of complications and morbidity.  The differential diagnosis includes acute injuries, near syncope   Co morbidities that complicate the patient evaluation  HLD, chronic bronchitis, HTN, arthritis, atrial fibrillation, COPD, CKD, GERD, CHF, anxiety, T2DM, anemia   Additional history obtained:  Additional history obtained from EMS External records from outside source obtained and reviewed including EMR   Lab Tests:  I Ordered, and personally interpreted labs.  The pertinent results include: Baseline creatinine, normal electrolytes, normal hemoglobin, no leukocytosis, normal CK, high-normal troponin consistent with prior lab work   Imaging Studies ordered:  I ordered imaging studies including x-ray of chest and pelvis; CT scan of head, cervical spine, chest, abdomen, pelvis, T-spine, L-spine I independently visualized and interpreted imaging which showed no acute findings on x-rays.  CT scans are pending at time of signout. I agree with the radiologist interpretation   Cardiac Monitoring: / EKG:  The patient was maintained on a cardiac monitor.  I personally viewed and interpreted the cardiac monitored which showed an underlying rhythm of: Atrial fibrillation   Problem List / ED Course / Critical interventions / Medication management  Patient is a pleasant 89 year old female presenting after an unwitnessed fall in her independent living facility.  She is on Eliquis.  She did strike the back of her head.  She remembers the fall and does not believe that she lost consciousness.  She was unable to get up off of the floor on her own.  She was likely laying on the floor for several hours.  On arrival in the ED, patient is alert and oriented.  She has  a hematoma to her left occiput but skin is intact.  She has no focal neurologic deficits.  Breathing is unlabored.  Abdomen is soft and nontender.  Tylenol was ordered for analgesia.  Diagnostic workup was initiated.  X-rays of chest and pelvis did not show acute findings.  Initial lab work is reassuring.  While in the ED, patient had some mild agitation.  Fentanyl was ordered for ongoing analgesia.  CT scans were pending at time of signout.  Care of patient was signed out to oncoming ED provider. I ordered medication including Tylenol and fentanyl for analgesia Reevaluation of the patient after these medicines showed that the patient improved I have reviewed the patients home medicines and have made adjustments as needed   Social Determinants of Health:  Resides in independent living facility        Final Clinical Impression(s) / ED Diagnoses Final diagnoses:  Fall, initial encounter    Rx / DC Orders ED Discharge Orders     None         Gloris Manchester, MD 12/11/22 1520

## 2022-12-11 NOTE — ED Provider Notes (Signed)
Patient signed out to me at 3 PM.  Awaiting CT scans after unwitnessed fall at her independent living facility.  Patient was seen earlier this morning ambulating in her apartment.  Her knee gave out and she fell.  She is unable to get help right away.  She is got a hematoma to the back of her head.  She is on Eliquis.  Lab work thus far unremarkable.  Troponin 18 but appears to be baseline.  Plan is to get second troponin.  And get CT scan of her head, neck, chest abdomen pelvis and back.  Chest x-ray and pelvic x-ray thus far unremarkable.  Images overall per radiology report are unremarkable.  Lab work unremarkable.  Troponins at their baseline.  She was sedated mildly by the Ativan that was given for her to tolerate CT scan.  She was without any discomfort on my reevaluation.  Family at the bedside.  They feel comfortable taking her home.  Patient was discharged in good condition.  This chart was dictated using voice recognition software.  Despite best efforts to proofread,  errors can occur which can change the documentation meaning.    Virgina Norfolk, DO 12/11/22 2005

## 2022-12-11 NOTE — ED Notes (Signed)
Pt on bedpan at this time.

## 2022-12-11 NOTE — Progress Notes (Signed)
Orthopedic Tech Progress Note Patient Details:  Tara Fields 01/05/1928 [id number]  Level II trauma, ortho tech services not needed at this moment.  Patient ID: Tara Fields, female   DOB: 12-29-1927, 89 y.o.   MRN: [id number]  Docia Furl 12/11/2022, 1:28 PM

## 2022-12-12 ENCOUNTER — Inpatient Hospital Stay (HOSPITAL_COMMUNITY)
Admission: EM | Admit: 2022-12-12 | Discharge: 2022-12-16 | DRG: 065 | Disposition: A | Discharge status: Skilled Nursing Facility | Payer: Medicare Other | Source: Skilled Nursing Facility | Attending: Internal Medicine | Admitting: Internal Medicine

## 2022-12-12 ENCOUNTER — Emergency Department (HOSPITAL_COMMUNITY): Payer: Medicare Other

## 2022-12-12 ENCOUNTER — Other Ambulatory Visit: Payer: Self-pay

## 2022-12-12 ENCOUNTER — Encounter (HOSPITAL_COMMUNITY): Payer: Self-pay

## 2022-12-12 DIAGNOSIS — Z9049 Acquired absence of other specified parts of digestive tract: Secondary | ICD-10-CM

## 2022-12-12 DIAGNOSIS — Z923 Personal history of irradiation: Secondary | ICD-10-CM

## 2022-12-12 DIAGNOSIS — H53462 Homonymous bilateral field defects, left side: Secondary | ICD-10-CM | POA: Diagnosis present

## 2022-12-12 DIAGNOSIS — I2510 Atherosclerotic heart disease of native coronary artery without angina pectoris: Secondary | ICD-10-CM | POA: Diagnosis present

## 2022-12-12 DIAGNOSIS — Z823 Family history of stroke: Secondary | ICD-10-CM

## 2022-12-12 DIAGNOSIS — E785 Hyperlipidemia, unspecified: Secondary | ICD-10-CM | POA: Diagnosis present

## 2022-12-12 DIAGNOSIS — R29810 Facial weakness: Secondary | ICD-10-CM | POA: Diagnosis present

## 2022-12-12 DIAGNOSIS — Z952 Presence of prosthetic heart valve: Secondary | ICD-10-CM

## 2022-12-12 DIAGNOSIS — Z79899 Other long term (current) drug therapy: Secondary | ICD-10-CM

## 2022-12-12 DIAGNOSIS — Z8616 Personal history of COVID-19: Secondary | ICD-10-CM

## 2022-12-12 DIAGNOSIS — I5022 Chronic systolic (congestive) heart failure: Secondary | ICD-10-CM | POA: Diagnosis present

## 2022-12-12 DIAGNOSIS — E1122 Type 2 diabetes mellitus with diabetic chronic kidney disease: Secondary | ICD-10-CM | POA: Diagnosis present

## 2022-12-12 DIAGNOSIS — I639 Cerebral infarction, unspecified: Principal | ICD-10-CM

## 2022-12-12 DIAGNOSIS — Z87891 Personal history of nicotine dependence: Secondary | ICD-10-CM

## 2022-12-12 DIAGNOSIS — E440 Moderate protein-calorie malnutrition: Secondary | ICD-10-CM | POA: Diagnosis present

## 2022-12-12 DIAGNOSIS — Z7901 Long term (current) use of anticoagulants: Secondary | ICD-10-CM

## 2022-12-12 DIAGNOSIS — J449 Chronic obstructive pulmonary disease, unspecified: Secondary | ICD-10-CM | POA: Diagnosis present

## 2022-12-12 DIAGNOSIS — Z90710 Acquired absence of both cervix and uterus: Secondary | ICD-10-CM

## 2022-12-12 DIAGNOSIS — M7989 Other specified soft tissue disorders: Secondary | ICD-10-CM | POA: Diagnosis present

## 2022-12-12 DIAGNOSIS — I10 Essential (primary) hypertension: Secondary | ICD-10-CM | POA: Diagnosis present

## 2022-12-12 DIAGNOSIS — I4819 Other persistent atrial fibrillation: Secondary | ICD-10-CM | POA: Diagnosis present

## 2022-12-12 DIAGNOSIS — G8194 Hemiplegia, unspecified affecting left nondominant side: Secondary | ICD-10-CM | POA: Diagnosis present

## 2022-12-12 DIAGNOSIS — Z9842 Cataract extraction status, left eye: Secondary | ICD-10-CM

## 2022-12-12 DIAGNOSIS — M25512 Pain in left shoulder: Secondary | ICD-10-CM | POA: Diagnosis present

## 2022-12-12 DIAGNOSIS — I5043 Acute on chronic combined systolic (congestive) and diastolic (congestive) heart failure: Secondary | ICD-10-CM

## 2022-12-12 DIAGNOSIS — R4189 Other symptoms and signs involving cognitive functions and awareness: Secondary | ICD-10-CM | POA: Diagnosis present

## 2022-12-12 DIAGNOSIS — S0003XA Contusion of scalp, initial encounter: Secondary | ICD-10-CM | POA: Diagnosis present

## 2022-12-12 DIAGNOSIS — W07XXXA Fall from chair, initial encounter: Secondary | ICD-10-CM | POA: Diagnosis present

## 2022-12-12 DIAGNOSIS — I130 Hypertensive heart and chronic kidney disease with heart failure and stage 1 through stage 4 chronic kidney disease, or unspecified chronic kidney disease: Secondary | ICD-10-CM | POA: Diagnosis present

## 2022-12-12 DIAGNOSIS — N1832 Chronic kidney disease, stage 3b: Secondary | ICD-10-CM | POA: Diagnosis present

## 2022-12-12 DIAGNOSIS — Z9841 Cataract extraction status, right eye: Secondary | ICD-10-CM

## 2022-12-12 DIAGNOSIS — R29716 NIHSS score 16: Secondary | ICD-10-CM | POA: Diagnosis present

## 2022-12-12 DIAGNOSIS — Z6827 Body mass index (BMI) 27.0-27.9, adult: Secondary | ICD-10-CM

## 2022-12-12 DIAGNOSIS — H548 Legal blindness, as defined in USA: Secondary | ICD-10-CM | POA: Diagnosis present

## 2022-12-12 DIAGNOSIS — I63411 Cerebral infarction due to embolism of right middle cerebral artery: Principal | ICD-10-CM | POA: Diagnosis present

## 2022-12-12 DIAGNOSIS — E1169 Type 2 diabetes mellitus with other specified complication: Secondary | ICD-10-CM | POA: Diagnosis present

## 2022-12-12 DIAGNOSIS — R9431 Abnormal electrocardiogram [ECG] [EKG]: Secondary | ICD-10-CM | POA: Diagnosis present

## 2022-12-12 DIAGNOSIS — Z66 Do not resuscitate: Secondary | ICD-10-CM | POA: Diagnosis present

## 2022-12-12 DIAGNOSIS — Z515 Encounter for palliative care: Secondary | ICD-10-CM

## 2022-12-12 DIAGNOSIS — Z885 Allergy status to narcotic agent status: Secondary | ICD-10-CM

## 2022-12-12 DIAGNOSIS — G8929 Other chronic pain: Secondary | ICD-10-CM | POA: Diagnosis present

## 2022-12-12 DIAGNOSIS — I6522 Occlusion and stenosis of left carotid artery: Secondary | ICD-10-CM | POA: Diagnosis present

## 2022-12-12 DIAGNOSIS — W19XXXA Unspecified fall, initial encounter: Secondary | ICD-10-CM | POA: Diagnosis present

## 2022-12-12 DIAGNOSIS — Z833 Family history of diabetes mellitus: Secondary | ICD-10-CM

## 2022-12-12 DIAGNOSIS — K219 Gastro-esophageal reflux disease without esophagitis: Secondary | ICD-10-CM | POA: Diagnosis present

## 2022-12-12 DIAGNOSIS — I5042 Chronic combined systolic (congestive) and diastolic (congestive) heart failure: Secondary | ICD-10-CM | POA: Diagnosis present

## 2022-12-12 DIAGNOSIS — Z85118 Personal history of other malignant neoplasm of bronchus and lung: Secondary | ICD-10-CM

## 2022-12-12 DIAGNOSIS — Z9109 Other allergy status, other than to drugs and biological substances: Secondary | ICD-10-CM

## 2022-12-12 DIAGNOSIS — R296 Repeated falls: Secondary | ICD-10-CM | POA: Diagnosis present

## 2022-12-12 DIAGNOSIS — F0390 Unspecified dementia without behavioral disturbance: Secondary | ICD-10-CM | POA: Diagnosis present

## 2022-12-12 DIAGNOSIS — M79642 Pain in left hand: Secondary | ICD-10-CM | POA: Diagnosis present

## 2022-12-12 DIAGNOSIS — M542 Cervicalgia: Secondary | ICD-10-CM | POA: Diagnosis present

## 2022-12-12 DIAGNOSIS — I708 Atherosclerosis of other arteries: Secondary | ICD-10-CM | POA: Diagnosis present

## 2022-12-12 DIAGNOSIS — Z888 Allergy status to other drugs, medicaments and biological substances status: Secondary | ICD-10-CM

## 2022-12-12 LAB — BASIC METABOLIC PANEL
Anion gap: 15 (ref 5–15)
BUN: 36 mg/dL — ABNORMAL HIGH (ref 8–23)
CO2: 22 mmol/L (ref 22–32)
Calcium: 9 mg/dL (ref 8.9–10.3)
Chloride: 100 mmol/L (ref 98–111)
Creatinine, Ser: 1.25 mg/dL — ABNORMAL HIGH (ref 0.44–1.00)
GFR, Estimated: 40 mL/min — ABNORMAL LOW (ref >60)
Glucose, Bld: 114 mg/dL — ABNORMAL HIGH (ref 70–99)
Potassium: 3.7 mmol/L (ref 3.5–5.1)
Sodium: 137 mmol/L (ref 135–145)

## 2022-12-12 LAB — CBC WITH DIFFERENTIAL/PLATELET
Abs Immature Granulocytes: 0.02 10*3/uL (ref 0.00–0.07)
Basophils Absolute: 0 10*3/uL (ref 0.0–0.1)
Basophils Relative: 0 %
Eosinophils Absolute: 0.1 10*3/uL (ref 0.0–0.5)
Eosinophils Relative: 2 %
HCT: 33.9 % — ABNORMAL LOW (ref 36.0–46.0)
Hemoglobin: 11.1 g/dL — ABNORMAL LOW (ref 12.0–15.0)
Immature Granulocytes: 0 %
Lymphocytes Relative: 12 %
Lymphs Abs: 0.7 10*3/uL (ref 0.7–4.0)
MCH: 30.8 pg (ref 26.0–34.0)
MCHC: 32.7 g/dL (ref 30.0–36.0)
MCV: 94.2 fL (ref 80.0–100.0)
Monocytes Absolute: 0.6 10*3/uL (ref 0.1–1.0)
Monocytes Relative: 9 %
Neutro Abs: 4.6 10*3/uL (ref 1.7–7.7)
Neutrophils Relative %: 77 %
Platelets: 172 10*3/uL (ref 150–400)
RBC: 3.6 MIL/uL — ABNORMAL LOW (ref 3.87–5.11)
RDW: 15 % (ref 11.5–15.5)
WBC: 6.1 10*3/uL (ref 4.0–10.5)
nRBC: 0 % (ref 0.0–0.2)

## 2022-12-12 LAB — TROPONIN I (HIGH SENSITIVITY): Troponin I (High Sensitivity): 19 ng/L — ABNORMAL HIGH (ref <18)

## 2022-12-12 NOTE — ED Provider Notes (Signed)
[place] EMERGENCY DEPARTMENT AT [facility] Provider Note   CSN: [id number] Arrival date & time: 12/12/22  2021     History {Add pertinent medical, surgical, social history, OB history to HPI:1} Chief Complaint  Patient presents with   Weakness    Pt arrived via ems from [place] independent living after son came in and wanted her evaluated for weakness. Pt fell yesterday seen in er sent back to facility has swelling and pain to left hand wrist    Tara Fields is a 89 y.o. female.  Patient is a 89 year old female with a history of COPD, diabetes, hypertension, hyperlipidemia, CAD, aortic stenosis status post TAVR, CHF, atrial fibrillation on Eliquis who is presenting today from her independent living facility due to worsening weakness and now inability to move her left arm.  Reports come from her son who is available by phone [name].  He reports that she was in the emergency room yesterday after her knees buckled and she fell.  She had extensive workup with CT imaging, blood work and everything was overall looking well.  However he reports when she got home she was not acting quite herself but she had received fentanyl and Ativan in the emergency room due to pain and inability to tolerate a CAT scan.  He reports that she has slept all day today and has not been herself.  This morning he was able to get her up and to the bathroom but then around 4:00 they found her sitting in the floor and it took 3 people to get her off the floor and back into bed and then later tonight she told him she cannot move her left arm or leg and he said she needed to get up to use the bathroom and she said she would but for an hour she kept saying she was going to get up and never could.  She did take a tramadol today which is a chronic medication she is taken for 30 years but no other medication changes.  They also noticed that there was some swelling in her legs today which she had not had  yesterday but he knew she did receive some fluids and the emergency room.  Patient is very sleepy on exam.  She does report her left arm feels very heavy  The history is provided by the patient, a relative and medical records.  Weakness      Home Medications Prior to Admission medications   Medication Sig Start Date End Date Taking? Authorizing Provider  acetaminophen (TYLENOL) 500 MG tablet Take 500 mg by mouth 3 (three) times daily as needed for moderate pain. Take with Tramadol    [provider]  atorvastatin (LIPITOR) 40 MG tablet TAKE 1 TABLET BY MOUTH DAILY AT 6 PM. Patient taking differently: Take 40 mg by mouth daily. 06/18/22   Nafziger, Kandee Keen, NP  cholecalciferol (VITAMIN D3) 25 MCG (1000 UNIT) tablet Take 1,000 Units by mouth daily.    [provider]  Coenzyme Q10 (CO Q-10) 300 MG CAPS Take 300 mg by mouth in the morning.    [provider]  donepezil (ARICEPT) 5 MG tablet Take 1 tablet (5 mg total) by mouth at bedtime. Patient not taking: Reported on 12/11/2022 12/07/22   Shirline Frees, NP  ELIQUIS 5 MG TABS tablet TAKE 1 TABLET BY MOUTH TWICE A DAY 09/01/22   Nafziger, Kandee Keen, NP  ferrous gluconate (FERGON) 324 MG tablet TAKE 1 TABLET BY MOUTH DAILY WITH BREAKFAST 09/21/22  Nafziger, Kandee Keen, NP  fluticasone (FLONASE) 50 MCG/ACT nasal spray Place 2 sprays into both nostrils daily as needed for allergies or rhinitis.    [provider]  Lidocaine-Menthol, Spray, 4-1 % LIQD Apply 1 spray topically daily as needed (back/knee pain/restless legs).    [provider]  Magnesium 200 MG TABS Take 200 mg by mouth every other day.    [provider]  metoprolol succinate (TOPROL XL) 50 MG 24 hr tablet Take 1 tablet (50 mg total) by mouth 2 (two) times daily. Take with or immediately following a meal. 12/03/22   Kathleene Hazel, MD  Misc Natural Products (OSTEO BI-FLEX ADV JOINT SHIELD PO) Take 1 tablet by mouth daily.    [provider]  Multiple Vitamins-Minerals (CENTRUM MINIS WOMEN 50+ PO) Take 1 tablet by mouth daily.    [provider]  Omega-3 Fatty Acids (FISH OIL) 1200 MG CAPS Take 1 capsule by mouth daily.    [provider]  ondansetron (ZOFRAN) 4 MG tablet Take 4 mg by mouth every 6 (six) hours as needed for nausea or vomiting.    [provider]  potassium chloride 20 MEQ/15ML (10%) SOLN TAKE 15 MLS (20 MEQ TOTAL) BY MOUTH DAILY. Patient taking differently: Take 20 mEq by mouth daily. 10ml 09/16/22 12/15/22  Dyann Kief, PA-C  psyllium (METAMUCIL) 58.6 % powder Take 1 packet by mouth daily as needed (for constipation).    [provider]  QUEtiapine (SEROQUEL) 25 MG tablet Take 12.5 mg by mouth at bedtime.    [provider]  sodium chloride (OCEAN) 0.65 % SOLN nasal spray Place 1 spray into both nostrils as needed for congestion.    [provider]  spironolactone (ALDACTONE) 25 MG tablet Take 12.5 mg by mouth daily.    [provider]  terazosin (HYTRIN) 10 MG capsule TAKE 1 CAPSULE BY MOUTH AT BEDTIME. 05/11/22   Nafziger, Kandee Keen, NP  torsemide (DEMADEX) 20 MG tablet Take 2 tablets (40 mg total) by mouth daily. Incase of weight gain 2 to 3 lbs in 24 hrs or 5 lbs in 7 days, take 2 tablets twice a day until weight back to baseline. Patient taking differently: Take 20 mg by mouth 2 (two) times daily. Incase of weight gain 2 to 3 lbs in 24 hrs or 5 lbs in 7 days, take 2 tablets twice a day until weight back to baseline. 09/09/22   Arrien, York Ram, MD  traMADol Janean Sark) 50 MG tablet TAKE 1 TABLET BY MOUTH THREE TIMES A DAY 11/03/22   Nafziger, Kandee Keen, NP  traZODone (DESYREL) 50 MG tablet Take 25 mg by mouth at bedtime as needed for sleep.    [provider]  vitamin B-12 (CYANOCOBALAMIN) 1000 MCG tablet Take 1 tablet (1,000 mcg total) by mouth daily. 04/16/21   Meredeth Ide, MD      Allergies    Amlodipine besy-benazepril hcl, Hctz  [hydrochlorothiazide], Other, Cyclobenzaprine, Diclofenac sodium, Hydralazine, Methylprednisolone, and Oxycodone    Review of Systems   Review of Systems  Neurological:  Positive for weakness.    Physical Exam Updated Vital Signs BP 124/78 (BP Location: Right Arm)   Pulse 75   Temp 97.8 F (36.6 C) (Oral)   Resp 18   Ht 5' (1.524 m)   Wt 62.7 kg   SpO2 93%   BMI 27.00 kg/m  Physical Exam Vitals and nursing note reviewed.  Constitutional:      General: She is not in  acute distress.    Appearance: She is well-developed.  HENT:     Head: Normocephalic and atraumatic.  Eyes:     Comments: Pupils are pinpoint  Cardiovascular:     Rate and Rhythm: Normal rate and regular rhythm.     Heart sounds: No murmur heard.    No friction rub. Gallop present.  Pulmonary:     Effort: Pulmonary effort is normal.     Breath sounds: Normal breath sounds. No wheezing or rales.  Chest:     Chest wall: No tenderness.  Abdominal:     General: Bowel sounds are normal. There is no distension.     Palpations: Abdomen is soft.     Tenderness: There is no abdominal tenderness. There is no guarding or rebound.  Musculoskeletal:        General: No tenderness. Normal range of motion.     Right lower leg: Edema present.     Left lower leg: Edema present.     Comments: Trace edema in ankles bilaterally  Skin:    General: Skin is warm and dry.     Findings: No rash.  Neurological:     Mental Status: She is alert.     Cranial Nerves: No cranial nerve deficit.     Comments: Very sleepy but arousable.  3/5 strength in the left upper ext.  Bilateral lower extremities seem symmetric 5 out of 5 strength.  Right upper extremity with 5 out of 5 strength and no drift.  Patient not cooperative enough for testing of facial muscle strength.  Psychiatric:        Behavior: Behavior normal.     ED Results / Procedures / Treatments   Labs (all labs ordered are listed, but only abnormal results are  displayed) Labs Reviewed  CBC WITH DIFFERENTIAL/PLATELET  BASIC METABOLIC PANEL  TROPONIN I (HIGH SENSITIVITY)    EKG None  Radiology DG Chest Port 1 View  Result Date: 12/12/2022 CLINICAL DATA:  Left arm weakness.  History of lung cancer. EXAM: PORTABLE CHEST 1 VIEW COMPARISON:  Chest x-ray 12/11/2022.  CT chest 12/11/2022. FINDINGS: Heart is enlarged. Patient is status post TAVR. There are patchy airspace and central interstitial opacities bilaterally. There is a band of opacity in the left upper lobe appears unchanged from most recent CT. No pleural effusion or pneumothorax. No acute fractures are seen. IMPRESSION: 1. Cardiomegaly with patchy airspace and central interstitial opacities bilaterally, likely pulmonary edema. 2. Band of opacity in the left upper lobe appears unchanged from most recent CT. Electronically Signed   By: Darliss Cheney M.D.   On: 12/12/2022 21:48   DG Shoulder Left  Result Date: 12/12/2022 CLINICAL DATA:  Weakness EXAM: LEFT SHOULDER - 2+ VIEW COMPARISON:  None Available. FINDINGS: Degenerative changes of the acromioclavicular joint and glenohumeral joint are seen. No acute fracture or dislocation is noted. Linear density is noted similar to that seen on recent chest CT consistent with post radiation change. No underlying bony abnormality is noted. IMPRESSION: Degenerative change without acute abnormality. Electronically Signed   By: Alcide Clever M.D.   On: 12/12/2022 21:47   CT T-SPINE NO CHARGE  Result Date: 12/11/2022 CLINICAL DATA:  Trauma, history of lung cancer, * Tracking Code: BO * EXAM: CT CHEST, ABDOMEN AND PELVIS WITH CONTRAST CT THORACIC AND LUMBAR SPINE WITH CONTRAST TECHNIQUE: Multidetector CT imaging of the chest, abdomen and pelvis was performed following the standard protocol during bolus administration of intravenous contrast. Multidetector CT imaging of the thoracic  and lumbar spine was performed following the standard protocol during bolus  administration of intravenous contrast. RADIATION DOSE REDUCTION: This exam was performed according to the departmental dose-optimization program which includes automated exposure control, adjustment of the mA and/or kV according to patient size and/or use of iterative reconstruction technique. COMPARISON:  CT chest abdomen pelvis, 04/23/2022 FINDINGS: CT CHEST FINDINGS Cardiovascular: Aortic atherosclerosis. Aortic valve stent endograft. Cardiomegaly. Enlargement of the main pulmonary artery measuring up to 3.6 cm in caliber. Three-vessel coronary artery calcifications. No pericardial effusion. Mediastinum/Nodes: No enlarged mediastinal, hilar, or axillary lymph nodes. Thyroid gland, trachea, and esophagus demonstrate no significant findings. Lungs/Pleura: Moderate centrilobular emphysema. Unchanged post treatment/post radiation appearance of the suprahilar left lung (series 6, image 40). Small bilateral pleural effusions and diffuse interlobular septal thickening throughout. Musculoskeletal: No chest wall mass or suspicious osseous lesions identified. CT ABDOMEN PELVIS FINDINGS Hepatobiliary: No focal liver abnormality is seen. Status post cholecystectomy. Unchanged postoperative biliary ductal dilatation. Pancreas: Unremarkable. No pancreatic ductal dilatation or surrounding inflammatory changes. Spleen: Normal in size without significant abnormality. Adrenals/Urinary Tract: Unchanged, definitively benign left adrenal adenoma, for which no further follow-up or characterization is required (series 4, image 63). Kidneys are normal, without renal calculi, solid lesion, or hydronephrosis. Bladder is unremarkable. Stomach/Bowel: Stomach is within normal limits. Appendix is not clearly visualized. No evidence of bowel wall thickening, distention, or inflammatory changes. Descending and sigmoid diverticulosis. Vascular/Lymphatic: Aortic atherosclerosis. No enlarged abdominal or pelvic lymph nodes. Reproductive: No mass  or other abnormality. Other: Small broad-based midline ventral hernia (series 4, image 80). No ascites. Musculoskeletal: No acute osseous findings. CT THORACIC AND LUMBAR SPINE FINDINGS Alignment: Normal thoracic kyphosis. Degenerative and postoperative straightening of the normal lumbar lordosis. Vertebral bodies: Osteopenia.  No fracture or dislocation. Disc spaces: Status post posterior fusion of L4 through S1. Moderate disc space height loss and osteophytosis throughout the thoracic and lumbar spine, worst at L1 through L4. Paraspinous soft tissues: Unremarkable. IMPRESSION: 1. No CT evidence of acute traumatic injury to the chest, abdomen, or pelvis. 2. No fracture or dislocation of the thoracic or lumbar spine. 3. Unchanged post treatment/post radiation appearance of the suprahilar left lung. No evidence of lymphadenopathy or metastatic disease in the chest, abdomen, or pelvis. 4. Small bilateral pleural effusions and diffuse interlobular septal thickening throughout the lungs, consistent with pulmonary edema. 5. Cardiomegaly and coronary artery disease. 6. Enlargement of the main pulmonary artery, as can be seen in pulmonary hypertension. 7. Emphysema. Aortic Atherosclerosis (ICD10-I70.0) and Emphysema (ICD10-J43.9). Electronically Signed   By: Jearld Lesch M.D.   On: 12/11/2022 16:56   CT L-SPINE NO CHARGE  Result Date: 12/11/2022 CLINICAL DATA:  Trauma, history of lung cancer, * Tracking Code: BO * EXAM: CT CHEST, ABDOMEN AND PELVIS WITH CONTRAST CT THORACIC AND LUMBAR SPINE WITH CONTRAST TECHNIQUE: Multidetector CT imaging of the chest, abdomen and pelvis was performed following the standard protocol during bolus administration of intravenous contrast. Multidetector CT imaging of the thoracic and lumbar spine was performed following the standard protocol during bolus administration of intravenous contrast. RADIATION DOSE REDUCTION: This exam was performed according to the departmental  dose-optimization program which includes automated exposure control, adjustment of the mA and/or kV according to patient size and/or use of iterative reconstruction technique. COMPARISON:  CT chest abdomen pelvis, 04/23/2022 FINDINGS: CT CHEST FINDINGS Cardiovascular: Aortic atherosclerosis. Aortic valve stent endograft. Cardiomegaly. Enlargement of the main pulmonary artery measuring up to 3.6 cm in caliber. Three-vessel coronary artery calcifications. No pericardial effusion. Mediastinum/Nodes: No enlarged  mediastinal, hilar, or axillary lymph nodes. Thyroid gland, trachea, and esophagus demonstrate no significant findings. Lungs/Pleura: Moderate centrilobular emphysema. Unchanged post treatment/post radiation appearance of the suprahilar left lung (series 6, image 40). Small bilateral pleural effusions and diffuse interlobular septal thickening throughout. Musculoskeletal: No chest wall mass or suspicious osseous lesions identified. CT ABDOMEN PELVIS FINDINGS Hepatobiliary: No focal liver abnormality is seen. Status post cholecystectomy. Unchanged postoperative biliary ductal dilatation. Pancreas: Unremarkable. No pancreatic ductal dilatation or surrounding inflammatory changes. Spleen: Normal in size without significant abnormality. Adrenals/Urinary Tract: Unchanged, definitively benign left adrenal adenoma, for which no further follow-up or characterization is required (series 4, image 63). Kidneys are normal, without renal calculi, solid lesion, or hydronephrosis. Bladder is unremarkable. Stomach/Bowel: Stomach is within normal limits. Appendix is not clearly visualized. No evidence of bowel wall thickening, distention, or inflammatory changes. Descending and sigmoid diverticulosis. Vascular/Lymphatic: Aortic atherosclerosis. No enlarged abdominal or pelvic lymph nodes. Reproductive: No mass or other abnormality. Other: Small broad-based midline ventral hernia (series 4, image 80). No ascites. Musculoskeletal:  No acute osseous findings. CT THORACIC AND LUMBAR SPINE FINDINGS Alignment: Normal thoracic kyphosis. Degenerative and postoperative straightening of the normal lumbar lordosis. Vertebral bodies: Osteopenia.  No fracture or dislocation. Disc spaces: Status post posterior fusion of L4 through S1. Moderate disc space height loss and osteophytosis throughout the thoracic and lumbar spine, worst at L1 through L4. Paraspinous soft tissues: Unremarkable. IMPRESSION: 1. No CT evidence of acute traumatic injury to the chest, abdomen, or pelvis. 2. No fracture or dislocation of the thoracic or lumbar spine. 3. Unchanged post treatment/post radiation appearance of the suprahilar left lung. No evidence of lymphadenopathy or metastatic disease in the chest, abdomen, or pelvis. 4. Small bilateral pleural effusions and diffuse interlobular septal thickening throughout the lungs, consistent with pulmonary edema. 5. Cardiomegaly and coronary artery disease. 6. Enlargement of the main pulmonary artery, as can be seen in pulmonary hypertension. 7. Emphysema. Aortic Atherosclerosis (ICD10-I70.0) and Emphysema (ICD10-J43.9). Electronically Signed   By: Jearld Lesch M.D.   On: 12/11/2022 16:56   CT CHEST ABDOMEN PELVIS W CONTRAST  Result Date: 12/11/2022 CLINICAL DATA:  Trauma, history of lung cancer, * Tracking Code: BO * EXAM: CT CHEST, ABDOMEN AND PELVIS WITH CONTRAST CT THORACIC AND LUMBAR SPINE WITH CONTRAST TECHNIQUE: Multidetector CT imaging of the chest, abdomen and pelvis was performed following the standard protocol during bolus administration of intravenous contrast. Multidetector CT imaging of the thoracic and lumbar spine was performed following the standard protocol during bolus administration of intravenous contrast. RADIATION DOSE REDUCTION: This exam was performed according to the departmental dose-optimization program which includes automated exposure control, adjustment of the mA and/or kV according to patient  size and/or use of iterative reconstruction technique. COMPARISON:  CT chest abdomen pelvis, 04/23/2022 FINDINGS: CT CHEST FINDINGS Cardiovascular: Aortic atherosclerosis. Aortic valve stent endograft. Cardiomegaly. Enlargement of the main pulmonary artery measuring up to 3.6 cm in caliber. Three-vessel coronary artery calcifications. No pericardial effusion. Mediastinum/Nodes: No enlarged mediastinal, hilar, or axillary lymph nodes. Thyroid gland, trachea, and esophagus demonstrate no significant findings. Lungs/Pleura: Moderate centrilobular emphysema. Unchanged post treatment/post radiation appearance of the suprahilar left lung (series 6, image 40). Small bilateral pleural effusions and diffuse interlobular septal thickening throughout. Musculoskeletal: No chest wall mass or suspicious osseous lesions identified. CT ABDOMEN PELVIS FINDINGS Hepatobiliary: No focal liver abnormality is seen. Status post cholecystectomy. Unchanged postoperative biliary ductal dilatation. Pancreas: Unremarkable. No pancreatic ductal dilatation or surrounding inflammatory changes. Spleen: Normal in size without significant abnormality. Adrenals/Urinary  Tract: Unchanged, definitively benign left adrenal adenoma, for which no further follow-up or characterization is required (series 4, image 63). Kidneys are normal, without renal calculi, solid lesion, or hydronephrosis. Bladder is unremarkable. Stomach/Bowel: Stomach is within normal limits. Appendix is not clearly visualized. No evidence of bowel wall thickening, distention, or inflammatory changes. Descending and sigmoid diverticulosis. Vascular/Lymphatic: Aortic atherosclerosis. No enlarged abdominal or pelvic lymph nodes. Reproductive: No mass or other abnormality. Other: Small broad-based midline ventral hernia (series 4, image 80). No ascites. Musculoskeletal: No acute osseous findings. CT THORACIC AND LUMBAR SPINE FINDINGS Alignment: Normal thoracic kyphosis. Degenerative and  postoperative straightening of the normal lumbar lordosis. Vertebral bodies: Osteopenia.  No fracture or dislocation. Disc spaces: Status post posterior fusion of L4 through S1. Moderate disc space height loss and osteophytosis throughout the thoracic and lumbar spine, worst at L1 through L4. Paraspinous soft tissues: Unremarkable. IMPRESSION: 1. No CT evidence of acute traumatic injury to the chest, abdomen, or pelvis. 2. No fracture or dislocation of the thoracic or lumbar spine. 3. Unchanged post treatment/post radiation appearance of the suprahilar left lung. No evidence of lymphadenopathy or metastatic disease in the chest, abdomen, or pelvis. 4. Small bilateral pleural effusions and diffuse interlobular septal thickening throughout the lungs, consistent with pulmonary edema. 5. Cardiomegaly and coronary artery disease. 6. Enlargement of the main pulmonary artery, as can be seen in pulmonary hypertension. 7. Emphysema. Aortic Atherosclerosis (ICD10-I70.0) and Emphysema (ICD10-J43.9). Electronically Signed   By: Jearld Lesch M.D.   On: 12/11/2022 16:56   CT HEAD WO CONTRAST  Result Date: 12/11/2022 CLINICAL DATA:  Trauma EXAM: CT HEAD WITHOUT CONTRAST CT CERVICAL SPINE WITHOUT CONTRAST TECHNIQUE: Multidetector CT imaging of the head and cervical spine was performed following the standard protocol without intravenous contrast. Multiplanar CT image reconstructions of the cervical spine were also generated. RADIATION DOSE REDUCTION: This exam was performed according to the departmental dose-optimization program which includes automated exposure control, adjustment of the mA and/or kV according to patient size and/or use of iterative reconstruction technique. COMPARISON:  11/26/2022 FINDINGS: CT HEAD FINDINGS Brain: No evidence of acute infarction, hemorrhage, hydrocephalus, extra-axial collection or mass lesion/mass effect. Periventricular white matter hypodensity. Vascular: No hyperdense vessel or unexpected  calcification. Skull: Hyperostosis frontalis. Negative for fracture or focal lesion. Sinuses/Orbits: No acute finding. Other: Soft tissue contusion of the left scalp vertex (series 3, image 27). CT CERVICAL SPINE FINDINGS Alignment: Normal. Skull base and vertebrae: No acute fracture. No primary bone lesion or focal pathologic process. Soft tissues and spinal canal: No prevertebral fluid or swelling. No visible canal hematoma. Disc levels: Severe multilevel disc and facet degenerative disease and osteophytosis of the cervical spine, worst from C5-C7. Upper chest: Negative. Other: None. IMPRESSION: 1. No acute intracranial pathology. Small-vessel white matter disease. 2. Soft tissue contusion of the left scalp vertex. 3. No fracture or static subluxation of the cervical spine. 4. Severe multilevel disc and facet degenerative disease of the cervical spine, worst from C5-C7. Electronically Signed   By: Jearld Lesch M.D.   On: 12/11/2022 16:25   CT CERVICAL SPINE WO CONTRAST  Result Date: 12/11/2022 CLINICAL DATA:  Trauma EXAM: CT HEAD WITHOUT CONTRAST CT CERVICAL SPINE WITHOUT CONTRAST TECHNIQUE: Multidetector CT imaging of the head and cervical spine was performed following the standard protocol without intravenous contrast. Multiplanar CT image reconstructions of the cervical spine were also generated. RADIATION DOSE REDUCTION: This exam was performed according to the departmental dose-optimization program which includes automated exposure control, adjustment of the mA  and/or kV according to patient size and/or use of iterative reconstruction technique. COMPARISON:  11/26/2022 FINDINGS: CT HEAD FINDINGS Brain: No evidence of acute infarction, hemorrhage, hydrocephalus, extra-axial collection or mass lesion/mass effect. Periventricular white matter hypodensity. Vascular: No hyperdense vessel or unexpected calcification. Skull: Hyperostosis frontalis. Negative for fracture or focal lesion. Sinuses/Orbits: No acute  finding. Other: Soft tissue contusion of the left scalp vertex (series 3, image 27). CT CERVICAL SPINE FINDINGS Alignment: Normal. Skull base and vertebrae: No acute fracture. No primary bone lesion or focal pathologic process. Soft tissues and spinal canal: No prevertebral fluid or swelling. No visible canal hematoma. Disc levels: Severe multilevel disc and facet degenerative disease and osteophytosis of the cervical spine, worst from C5-C7. Upper chest: Negative. Other: None. IMPRESSION: 1. No acute intracranial pathology. Small-vessel white matter disease. 2. Soft tissue contusion of the left scalp vertex. 3. No fracture or static subluxation of the cervical spine. 4. Severe multilevel disc and facet degenerative disease of the cervical spine, worst from C5-C7. Electronically Signed   By: Jearld Lesch M.D.   On: 12/11/2022 16:25   DG Pelvis Portable  Result Date: 12/11/2022 CLINICAL DATA:  Trauma. EXAM: PORTABLE PELVIS 1-2 VIEWS COMPARISON:  None Available. FINDINGS: There is no evidence of pelvic fracture or diastasis. No pelvic bone lesions are seen. Severe osteoarthritic changes of the right hip, moderate osteoarthritic changes of the left hip. Vascular calcifications noted. Lower lumbosacral spine fusion. IMPRESSION: 1. No acute fracture or diastasis of the pelvis. 2. Severe osteoarthritic changes of the right hip, moderate osteoarthritic changes of the left hip. Electronically Signed   By: Ted Mcalpine M.D.   On: 12/11/2022 13:28   DG Chest Port 1 View  Result Date: 12/11/2022 CLINICAL DATA:  Fall. EXAM: PORTABLE CHEST 1 VIEW COMPARISON:  11/26/2022 FINDINGS: The cardio pericardial silhouette is enlarged. Status post TAVR. Interstitial markings are diffusely coarsened with chronic features. Streaky left parahilar density is likely atelectasis and mildly progressive in the interval. Bones are diffusely demineralized. IMPRESSION: Streaky left parahilar density is likely atelectasis and mildly  progressive in the interval. No evidence for pneumothorax or pleural effusion. Electronically Signed   By: Kennith Center M.D.   On: 12/11/2022 13:27    Procedures Procedures  {Document cardiac monitor, telemetry assessment procedure when appropriate:1}  Medications Ordered in ED Medications - No data to display  ED Course/ Medical Decision Making/ A&P   {   Click here for ABCD2, HEART and other calculatorsREFRESH Note before signing :1}                          Medical Decision Making Amount and/or Complexity of Data Reviewed Labs: ordered. Radiology: ordered.   Pt with multiple medical problems and comorbidities and presenting today with a complaint that caries a high risk for morbidity and mortality.  Here today due to worsening weakness and now in ability to get out of bed and walk.  Also patient from 4:00 on has said she cannot move her left arm.  Patient does take Eliquis daily.  Has been extremely drowsy today per son and noted on exam.  She does wake up and she can answer questions but she cannot hold her arm up to gravity.  This is a new change in her son reports she was walking with a walker without assistance just a few days ago.  Patient did have fentanyl and Ativan while she was in the emergency room yesterday and does take  tramadol daily but other than her daily tramadol which she has had for quite some time she has had no other medications.  Sats will drop to 84% while she is sleeping but then come back up spontaneously.  Concern for polypharmacy however also concern for new stroke with inability to lift her left arm.  Do not feel that there is an injury preventing her from lifting her arm.  Head CT done yesterday after her fall was negative but CT of her chest abdomen pelvis did show evidence of pulmonary edema.  I have independently visualized and interpreted pt's images today.  Chest x-ray today shows persistent pleural effusion and mild pulmonary edema seems similar to  yesterday.  Will repeat head CT due to concern for possible stroke however patient's son reports she would not be able to tolerate an MRI because of her claustrophobia.  Patient is fairly sedate and calm at this time may be able to tolerate MRI does not have contraindication for MRI.      {Document critical care time when appropriate:1} {Document review of labs and clinical decision tools ie heart score, Chads2Vasc2 etc:1}  {Document your independent review of radiology images, and any outside records:1} {Document your discussion with family members, caretakers, and with consultants:1} {Document social determinants of health affecting pt's care:1} {Document your decision making why or why not admission, treatments were needed:1} Final Clinical Impression(s) / ED Diagnoses Final diagnoses:  None    Rx / DC Orders ED Discharge Orders     None

## 2022-12-13 ENCOUNTER — Other Ambulatory Visit (HOSPITAL_COMMUNITY): Payer: Medicare Other

## 2022-12-13 ENCOUNTER — Emergency Department (HOSPITAL_COMMUNITY): Payer: Medicare Other

## 2022-12-13 ENCOUNTER — Encounter (HOSPITAL_COMMUNITY): Payer: Self-pay | Admitting: Internal Medicine

## 2022-12-13 ENCOUNTER — Inpatient Hospital Stay (HOSPITAL_COMMUNITY): Payer: Medicare Other

## 2022-12-13 ENCOUNTER — Encounter (HOSPITAL_COMMUNITY): Payer: Medicare Other

## 2022-12-13 DIAGNOSIS — I5042 Chronic combined systolic (congestive) and diastolic (congestive) heart failure: Secondary | ICD-10-CM | POA: Diagnosis present

## 2022-12-13 DIAGNOSIS — W19XXXA Unspecified fall, initial encounter: Secondary | ICD-10-CM | POA: Diagnosis present

## 2022-12-13 DIAGNOSIS — J449 Chronic obstructive pulmonary disease, unspecified: Secondary | ICD-10-CM | POA: Diagnosis present

## 2022-12-13 DIAGNOSIS — E1169 Type 2 diabetes mellitus with other specified complication: Secondary | ICD-10-CM | POA: Diagnosis present

## 2022-12-13 DIAGNOSIS — E785 Hyperlipidemia, unspecified: Secondary | ICD-10-CM | POA: Diagnosis present

## 2022-12-13 DIAGNOSIS — Z87891 Personal history of nicotine dependence: Secondary | ICD-10-CM | POA: Diagnosis not present

## 2022-12-13 DIAGNOSIS — G8929 Other chronic pain: Secondary | ICD-10-CM | POA: Diagnosis present

## 2022-12-13 DIAGNOSIS — E1122 Type 2 diabetes mellitus with diabetic chronic kidney disease: Secondary | ICD-10-CM | POA: Diagnosis present

## 2022-12-13 DIAGNOSIS — R569 Unspecified convulsions: Secondary | ICD-10-CM | POA: Diagnosis not present

## 2022-12-13 DIAGNOSIS — Z952 Presence of prosthetic heart valve: Secondary | ICD-10-CM | POA: Diagnosis not present

## 2022-12-13 DIAGNOSIS — I708 Atherosclerosis of other arteries: Secondary | ICD-10-CM | POA: Diagnosis present

## 2022-12-13 DIAGNOSIS — I639 Cerebral infarction, unspecified: Secondary | ICD-10-CM | POA: Diagnosis not present

## 2022-12-13 DIAGNOSIS — Z79899 Other long term (current) drug therapy: Secondary | ICD-10-CM | POA: Diagnosis not present

## 2022-12-13 DIAGNOSIS — I6522 Occlusion and stenosis of left carotid artery: Secondary | ICD-10-CM | POA: Diagnosis present

## 2022-12-13 DIAGNOSIS — I63511 Cerebral infarction due to unspecified occlusion or stenosis of right middle cerebral artery: Secondary | ICD-10-CM

## 2022-12-13 DIAGNOSIS — R4182 Altered mental status, unspecified: Secondary | ICD-10-CM | POA: Diagnosis not present

## 2022-12-13 DIAGNOSIS — F0390 Unspecified dementia without behavioral disturbance: Secondary | ICD-10-CM | POA: Diagnosis present

## 2022-12-13 DIAGNOSIS — H53462 Homonymous bilateral field defects, left side: Secondary | ICD-10-CM | POA: Diagnosis present

## 2022-12-13 DIAGNOSIS — Z6827 Body mass index (BMI) 27.0-27.9, adult: Secondary | ICD-10-CM | POA: Diagnosis not present

## 2022-12-13 DIAGNOSIS — I130 Hypertensive heart and chronic kidney disease with heart failure and stage 1 through stage 4 chronic kidney disease, or unspecified chronic kidney disease: Secondary | ICD-10-CM | POA: Diagnosis present

## 2022-12-13 DIAGNOSIS — R4189 Other symptoms and signs involving cognitive functions and awareness: Secondary | ICD-10-CM | POA: Diagnosis present

## 2022-12-13 DIAGNOSIS — Z8616 Personal history of COVID-19: Secondary | ICD-10-CM | POA: Diagnosis not present

## 2022-12-13 DIAGNOSIS — I63411 Cerebral infarction due to embolism of right middle cerebral artery: Secondary | ICD-10-CM | POA: Diagnosis present

## 2022-12-13 DIAGNOSIS — I255 Ischemic cardiomyopathy: Secondary | ICD-10-CM | POA: Diagnosis not present

## 2022-12-13 DIAGNOSIS — G8194 Hemiplegia, unspecified affecting left nondominant side: Secondary | ICD-10-CM | POA: Diagnosis present

## 2022-12-13 DIAGNOSIS — E440 Moderate protein-calorie malnutrition: Secondary | ICD-10-CM | POA: Diagnosis present

## 2022-12-13 DIAGNOSIS — W07XXXA Fall from chair, initial encounter: Secondary | ICD-10-CM | POA: Diagnosis present

## 2022-12-13 DIAGNOSIS — R9431 Abnormal electrocardiogram [ECG] [EKG]: Secondary | ICD-10-CM | POA: Diagnosis present

## 2022-12-13 DIAGNOSIS — I6521 Occlusion and stenosis of right carotid artery: Secondary | ICD-10-CM | POA: Diagnosis not present

## 2022-12-13 DIAGNOSIS — N1832 Chronic kidney disease, stage 3b: Secondary | ICD-10-CM | POA: Diagnosis present

## 2022-12-13 DIAGNOSIS — I482 Chronic atrial fibrillation, unspecified: Secondary | ICD-10-CM | POA: Diagnosis not present

## 2022-12-13 DIAGNOSIS — Z66 Do not resuscitate: Secondary | ICD-10-CM | POA: Diagnosis present

## 2022-12-13 DIAGNOSIS — I4819 Other persistent atrial fibrillation: Secondary | ICD-10-CM | POA: Diagnosis present

## 2022-12-13 DIAGNOSIS — I10 Essential (primary) hypertension: Secondary | ICD-10-CM | POA: Diagnosis not present

## 2022-12-13 DIAGNOSIS — Z515 Encounter for palliative care: Secondary | ICD-10-CM | POA: Diagnosis not present

## 2022-12-13 DIAGNOSIS — Z7189 Other specified counseling: Secondary | ICD-10-CM | POA: Diagnosis not present

## 2022-12-13 LAB — LIPID PANEL
Cholesterol: 105 mg/dL (ref 0–200)
HDL: 32 mg/dL — ABNORMAL LOW (ref >40)
LDL Cholesterol: 54 mg/dL (ref 0–99)
Total CHOL/HDL Ratio: 3.3 RATIO
Triglycerides: 97 mg/dL (ref <150)
VLDL: 19 mg/dL (ref 0–40)

## 2022-12-13 LAB — TROPONIN I (HIGH SENSITIVITY)
Troponin I (High Sensitivity): 16 ng/L (ref <18)
Troponin I (High Sensitivity): 17 ng/L (ref <18)

## 2022-12-13 LAB — BRAIN NATRIURETIC PEPTIDE: B Natriuretic Peptide: 2586.7 pg/mL — ABNORMAL HIGH (ref 0.0–100.0)

## 2022-12-13 LAB — HEMOGLOBIN A1C
Hgb A1c MFr Bld: 6.6 % — ABNORMAL HIGH (ref 4.8–5.6)
Mean Plasma Glucose: 142.72 mg/dL

## 2022-12-13 MED ORDER — ONDANSETRON HCL 4 MG/2ML IJ SOLN: 4.0000 mg | Freq: Four times a day (QID) | INTRAMUSCULAR | Status: DC | PRN

## 2022-12-13 MED ORDER — LORAZEPAM 2 MG/ML IJ SOLN: 1.0000 mg | Freq: Once | INTRAMUSCULAR | Status: DC | PRN

## 2022-12-13 MED ORDER — APIXABAN 5 MG PO TABS: 5.0000 mg | ORAL_TABLET | Freq: Two times a day (BID) | ORAL | Status: DC

## 2022-12-13 MED ORDER — QUETIAPINE FUMARATE 25 MG PO TABS
12.5000 mg | ORAL_TABLET | Freq: Every day | ORAL | Status: DC
  Administered 2022-12-13 – 2022-12-15 (×3): 12.5 mg via ORAL
  Filled 2022-12-13 (×3): qty 1

## 2022-12-13 MED ORDER — ASPIRIN 300 MG RE SUPP
300.0000 mg | Freq: Every day | RECTAL | Status: DC
  Administered 2022-12-13: 300 mg via RECTAL
  Filled 2022-12-13: qty 1

## 2022-12-13 MED ORDER — STROKE: EARLY STAGES OF RECOVERY BOOK
Freq: Once | Status: AC
  Filled 2022-12-13: qty 1

## 2022-12-13 MED ORDER — LIDOCAINE 5 % EX PTCH
1.0000 | MEDICATED_PATCH | CUTANEOUS | Status: DC
  Administered 2022-12-13 – 2022-12-14 (×2): 1 via TRANSDERMAL
  Filled 2022-12-13 (×3): qty 1

## 2022-12-13 MED ORDER — ACETAMINOPHEN 650 MG RE SUPP: 650.0000 mg | RECTAL | Status: DC | PRN

## 2022-12-13 MED ORDER — ONDANSETRON HCL 4 MG/2ML IJ SOLN
4.0000 mg | Freq: Once | INTRAMUSCULAR | Status: AC
  Administered 2022-12-13: 4 mg via INTRAVENOUS
  Filled 2022-12-13: qty 2

## 2022-12-13 MED ORDER — ACETAMINOPHEN 160 MG/5ML PO SOLN: 650.0000 mg | ORAL | Status: DC | PRN

## 2022-12-13 MED ORDER — ASPIRIN 325 MG PO TABS
325.0000 mg | ORAL_TABLET | Freq: Every day | ORAL | Status: DC
  Administered 2022-12-14: 325 mg via ORAL
  Filled 2022-12-13: qty 1

## 2022-12-13 MED ORDER — TRAZODONE HCL 50 MG PO TABS: 25.0000 mg | ORAL_TABLET | Freq: Every evening | ORAL | Status: DC | PRN

## 2022-12-13 MED ORDER — LORAZEPAM 2 MG/ML IJ SOLN: 0.5000 mg | Freq: Once | INTRAMUSCULAR | Status: DC | PRN

## 2022-12-13 MED ORDER — ATORVASTATIN CALCIUM 40 MG PO TABS
40.0000 mg | ORAL_TABLET | Freq: Every day | ORAL | Status: DC
  Administered 2022-12-13 – 2022-12-16 (×4): 40 mg via ORAL
  Filled 2022-12-13 (×4): qty 1

## 2022-12-13 MED ORDER — SENNOSIDES-DOCUSATE SODIUM 8.6-50 MG PO TABS: 1.0000 | ORAL_TABLET | Freq: Every evening | ORAL | Status: DC | PRN

## 2022-12-13 MED ORDER — ACETAMINOPHEN 325 MG PO TABS
650.0000 mg | ORAL_TABLET | ORAL | Status: DC | PRN
  Administered 2022-12-13: 650 mg via ORAL
  Filled 2022-12-13: qty 2

## 2022-12-13 MED ORDER — NITROGLYCERIN 0.4 MG SL SUBL
0.4000 mg | SUBLINGUAL_TABLET | SUBLINGUAL | Status: DC | PRN
  Administered 2022-12-13: 0.4 mg via SUBLINGUAL
  Filled 2022-12-13: qty 1

## 2022-12-13 MED ORDER — TRAMADOL HCL 50 MG PO TABS
50.0000 mg | ORAL_TABLET | Freq: Three times a day (TID) | ORAL | Status: DC
  Administered 2022-12-13 – 2022-12-16 (×10): 50 mg via ORAL
  Filled 2022-12-13 (×10): qty 1

## 2022-12-13 MED ORDER — DIAZEPAM 5 MG/ML IJ SOLN
2.0000 mg | Freq: Once | INTRAMUSCULAR | Status: AC
  Administered 2022-12-13: 2 mg via INTRAVENOUS
  Filled 2022-12-13: qty 2

## 2022-12-13 NOTE — Progress Notes (Signed)
EEG complete - results pending 

## 2022-12-13 NOTE — Evaluation (Signed)
Speech Language Pathology Evaluation Patient Details Name: Tara Fields MRN: [id number] DOB: Nov 13, 1927 Today's Date: 12/13/2022 Time: 7829-5621 SLP Time Calculation (min) (ACUTE ONLY): 21 min  Problem List:  Patient Active Problem List   Diagnosis Date Noted   Acute CVA (cerebrovascular accident) (HCC) 12/13/2022   Chronic pain 12/13/2022   Prolonged QT interval 12/13/2022   Dyslipidemia 09/07/2022   Essential hypertension 09/07/2022   Vitamin B12 deficiency 09/07/2022   Type 2 diabetes mellitus with chronic kidney disease, without long-term current use of insulin (HCC) 09/07/2022   Hard skin lesion 08/06/2022   Anxiety with agitation 07/06/2022   Stress due to family tension 07/06/2022   Pain of upper abdomen 12/12/2021   Difficulty swallowing pills 12/12/2021   Palliative care encounter 11/23/2021   Malnutrition of moderate degree 11/09/2021   Type 2 diabetes mellitus with hyperlipidemia (HCC) 11/08/2021   DNR (do not resuscitate) 11/08/2021   Leg cramps 09/03/2021   Acute renal failure superimposed on stage 3b chronic kidney disease (HCC)    Hyperlipidemia associated with type 2 diabetes mellitus (HCC)    Overweight (BMI 25.0-29.9)    Generalized weakness 06/07/2021   GERD (gastroesophageal reflux disease) 06/07/2021   Chronic systolic congestive heart failure (HCC) 05/05/2021   Anemia 04/13/2021   Symptomatic anemia 04/13/2021   Stage 3b chronic kidney disease (CKD) (HCC) 04/13/2021   Cognitive deficits    Pressure injury of skin 12/19/2020   COPD (chronic obstructive pulmonary disease) (HCC) 09/02/2020   Acute respiratory failure with hypoxia (HCC) 09/01/2020   Pulmonary nodule 1 cm or greater in diameter 01/03/2020   S/P TAVR (transcatheter aortic valve replacement) 05/03/2019   Primary osteoarthritis involving multiple joints 05/03/2019   Persistent atrial fibrillation (HCC) 05/03/2019   Bilateral carotid artery stenosis 12/01/2017   Degeneration of meniscus  of knee 08/13/2014   Aortic valve disorder 05/03/2014   Chronic obstructive bronchitis without exacerbation 05/03/2014   Derangement of left knee 05/03/2014   Esophageal reflux 05/03/2014   HTN (hypertension) 05/03/2014   Vitamin D deficiency 05/03/2014   Past Medical History:  Past Medical History:  Diagnosis Date   Acute cystitis without hematuria 02/03/2022   Acute on chronic combined systolic and diastolic CHF (congestive heart failure) (HCC)    Acute on chronic systolic (congestive) heart failure (HCC) 09/07/2022   Aortic stenosis    s/p TAVR 26 mm Edwards Sapien 3 THV July 2020   Atrial fibrillation with rapid ventricular response (HCC) 09/02/2021   CAD (coronary artery disease)    COPD (chronic obstructive pulmonary disease) (HCC)    COVID-19 virus infection 06/07/2021   Diabetes mellitus (HCC)    Diverticulitis    Elevated troponin 09/02/2021   GERD (gastroesophageal reflux disease)    GI bleed 04/13/2021   HTN (hypertension)    Hyperlipidemia    Lab test positive for detection of COVID-19 virus    Legally blind in left eye, as defined in [place]    Tobacco abuse    UTI (urinary tract infection) 11/10/2021   Past Surgical History:  Past Surgical History:  Procedure Laterality Date   ABDOMINAL HYSTERECTOMY     APPENDECTOMY     BACK SURGERY     CATARACT EXTRACTION, BILATERAL     CHOLECYSTECTOMY     COLONOSCOPY WITH PROPOFOL N/A 04/16/2021   Procedure: COLONOSCOPY WITH PROPOFOL;  Surgeon: Rachael Fee, MD;  Location: [facility];  Service: Gastroenterology;  Laterality: N/A;   ESOPHAGOGASTRODUODENOSCOPY (EGD) WITH PROPOFOL N/A 04/15/2021   Procedure: ESOPHAGOGASTRODUODENOSCOPY (EGD) WITH PROPOFOL;  Surgeon: Sherrilyn Rist, MD;  Location: [facility];  Service: Gastroenterology;  Laterality: N/A;   HPI:  Tara Fields is a 89 y.o. female who presented with weakness, LUE hemiparesis.  She was seen in the ER on 4/12 for increased confusion, worsening memory and  again on 4/27 after an unwitnessed fall. MRI 4/29: "Very truncated and motion degraded study. Small acute infarct is still suspected at the high right frontal parietal junction." Pt with medical history significant of chronic combined CHF, afib; dementia; CAD; COPD; DM; HTN; and HLD   Assessment / Plan / Recommendation Clinical Impression  Pt presents with a significant cognitive impairment.  She was assessed using the SLUMS and scored 17 of 30.  Pt has hx of dementia and it is unclear if this marks a significant difference from her baseline level of function.  Additionally, pt's performance may have been impacted by pain today.  Pt reports 7 or 8 of 10 chest pain/tightness and voiced concern for a heart attack. RN in room and aware and contacted provider. This may have made her less attentive to tasks that she otherwise would have been able to complete (for example, during reverse digit span, she repeated added all of the numbers together rather than repeating them in reverse order).  SLP will follow briefly while in house for potential intervetion vs reassessment.    SLP Assessment  SLP Recommendation/Assessment: Patient needs continued Speech Lanaguage Pathology Services SLP Visit Diagnosis: Cognitive communication deficit (R41.841)    Recommendations for follow up therapy are one component of a multi-disciplinary discharge planning process, led by the attending physician.  Recommendations may be updated based on patient status, additional functional criteria and insurance authorization.    Follow Up Recommendations   (TBD)    Assistance Recommended at Discharge  None  Functional Status Assessment Patient has had a recent decline in their functional status and demonstrates the ability to make significant improvements in function in a reasonable and predictable amount of time.  Frequency and Duration min 2x/week  2 weeks      SLP Evaluation Cognition  Overall Cognitive Status: History of  cognitive impairments - at baseline Orientation Level: Oriented to person Year: 2024 Day of Week: Incorrect Attention: Sustained;Focused Focused Attention: Impaired Focused Attention Impairment: Verbal basic Sustained Attention: Impaired Sustained Attention Impairment: Verbal basic Memory: Impaired Memory Impairment: Decreased short term memory Awareness: Impaired Problem Solving: Impaired       Comprehension  Auditory Comprehension Overall Auditory Comprehension: Appears within functional limits for tasks assessed Commands: Within Functional Limits Conversation: Simple Visual Recognition/Discrimination Discrimination: Not tested Reading Comprehension Reading Status: Not tested    Expression Expression Primary Mode of Expression: Verbal Verbal Expression Overall Verbal Expression: Appears within functional limits for tasks assessed Naming: Impairment Divergent: 75-100% accurate   Oral / Motor  Motor Speech Overall Motor Speech: Appears within functional limits for tasks assessed Respiration: Within functional limits Phonation: Normal Resonance: Within functional limits Articulation: Within functional limitis Intelligibility: Intelligible Motor Planning: Witnin functional limits Motor Speech Errors: Not applicable            Kerrie Pleasure, MA, CCC-SLP Acute Rehabilitation Services Office: [phone] 12/13/2022, 12:48 PM

## 2022-12-13 NOTE — Progress Notes (Signed)
Initial Nutrition Assessment  DOCUMENTATION CODES:   Non-severe (moderate) malnutrition in context of chronic illness  INTERVENTION:  Multivitamin w/ minerals daily Liberalize diet to regular due to malnutrition Meal ordering with assist Encourage good PO intake  Ensure Enlive po BID, each supplement provides 350 kcal and 20 grams of protein.  NUTRITION DIAGNOSIS:  Moderate Malnutrition related to chronic illness as evidenced by severe muscle depletion, mild muscle depletion.  GOAL:  Patient will meet greater than or equal to 90% of their needs  MONITOR:   PO intake, Supplement acceptance, Labs, I & O's  REASON FOR ASSESSMENT:   Consult Assessment of nutrition requirement/status  ASSESSMENT:   89 y.o. female presented to the ED with weakness. PMH includes COPD, T2DM, HTN, GERD, CKD IIIb, A.Fib, and HLD. Pt admitted with possible CVA.  Pt sleeping at time of RD visit, woke intermittently to RD touch. Pt unable to provide any nutrition or weight history. No family was present at bedside. Per EMR, pt weight appears to fluctuate.   RN reports some confusion. Discussed with team in rounds.   Medications reviewed. Labs reviewed.   NUTRITION - FOCUSED PHYSICAL EXAM:  Flowsheet Row Most Recent Value  Orbital Region Severe depletion  Upper Arm Region Mild depletion  Thoracic and Lumbar Region Mild depletion  Buccal Region Moderate depletion  Temple Region Severe depletion  Clavicle Bone Region Severe depletion  Clavicle and Acromion Bone Region Severe depletion  Scapular Bone Region Severe depletion  Dorsal Hand Unable to assess  Patellar Region Severe depletion  Anterior Thigh Region Severe depletion  Posterior Calf Region Severe depletion  Edema (RD Assessment) None  Hair Reviewed  Eyes Unable to assess  Mouth Unable to assess  Skin Reviewed  Nails Unable to assess   Diet Order:   Diet Order             Diet regular Room service appropriate? Yes with Assist;  Fluid consistency: Thin  Diet effective now                   EDUCATION NEEDS:   Not appropriate for education at this time  Skin:  Skin Assessment: Reviewed RN Assessment  Last BM:  Unknown/PTA  Height:   Ht Readings from Last 1 Encounters:  12/12/22 5' (1.524 m)    Weight:   Wt Readings from Last 1 Encounters:  12/12/22 62.7 kg    Ideal Body Weight:  45.5 kg  BMI:  Body mass index is 27 kg/m.  Estimated Nutritional Needs:  Kcal:  1600-1800 Protein:  80-100 grams Fluid:  >/= 1.6 L   Kirby Crigler RD, LDN Clinical Dietitian See [facility] for contact information.

## 2022-12-13 NOTE — Progress Notes (Addendum)
STROKE TEAM PROGRESS NOTE   INTERVAL HISTORY Consider changing eliquis to pradaxa  Awaiting MRI imaging of brain and C-Spine as well as MRA Neurologically has some cognitive deficits, but overall she is oriented x4.   Vitals:   12/13/22 0712 12/13/22 0730 12/13/22 0746 12/13/22 0800  BP: (!) 154/100 (!) 159/72  (!) 151/97  Pulse: 63 (!) 55  (!) 50  Resp: 14 15  15   Temp:   (!) 97.4 F (36.3 C)   TempSrc:   Oral   SpO2: 93% 93%  94%  Weight:      Height:       CBC:  Recent Labs  Lab 12/11/22 1322 12/11/22 1344 12/12/22 2215  WBC 7.3  --  6.1  NEUTROABS  --   --  4.6  HGB 12.2 11.9* 11.1*  HCT 36.5 35.0* 33.9*  MCV 94.1  --  94.2  PLT 158  --  172   Basic Metabolic Panel:  Recent Labs  Lab 12/11/22 1322 12/11/22 1344 12/12/22 2215  NA 138 139 137  K 4.4 4.2 3.7  CL 101 104 100  CO2 24  --  22  GLUCOSE 142* 138* 114*  BUN 39* 35* 36*  CREATININE 1.29* 1.30* 1.25*  CALCIUM 9.5  --  9.0   Lipid Panel: No results for input(s): "CHOL", "TRIG", "HDL", "CHOLHDL", "VLDL", "LDLCALC" in the last 168 hours. HgbA1c: No results for input(s): "HGBA1C" in the last 168 hours. Urine Drug Screen: No results for input(s): "LABOPIA", "COCAINSCRNUR", "LABBENZ", "AMPHETMU", "THCU", "LABBARB" in the last 168 hours.  Alcohol Level  Recent Labs  Lab 12/11/22 1305  ETH <10    IMAGING past 24 hours MR BRAIN WO CONTRAST  Result Date: 12/13/2022 CLINICAL DATA:  Stroke workup EXAM: MRI HEAD WITHOUT CONTRAST TECHNIQUE: Multiplanar, multiecho pulse sequences of the brain and surrounding structures were obtained without intravenous contrast. COMPARISON:  Head CT from yesterday FINDINGS: Only diffusion imaging was acquired, and very much motion degraded, due to patient condition despite sedative medication. Especially on coronal acquisition small areas of cortex and white matter infarct or suspected at high right frontal lobe. No hydrocephalus or gross midline shift. IMPRESSION: Very  truncated and motion degraded study. Small acute infarct is still suspected at the high right frontal parietal junction. Electronically Signed   By: Tiburcio Pea M.D.   On: 12/13/2022 05:10   CT Head Wo Contrast  Result Date: 12/12/2022 CLINICAL DATA:  Initial evaluation for neuro deficit, stroke suspected. EXAM: CT HEAD WITHOUT CONTRAST TECHNIQUE: Contiguous axial images were obtained from the base of the skull through the vertex without intravenous contrast. RADIATION DOSE REDUCTION: This exam was performed according to the departmental dose-optimization program which includes automated exposure control, adjustment of the mA and/or kV according to patient size and/or use of iterative reconstruction technique. COMPARISON:  Prior study from 12/11/2022. FINDINGS: Brain: Age-related cerebral volume loss with chronic small vessel ischemic disease. No acute intracranial hemorrhage. No acute large vessel territory infarct. No mass lesion or midline shift. No hydrocephalus or extra-axial fluid collection. Prominent dural base calcifications noted about the falx. Vascular: No abnormal hyperdense vessel. Calcified atherosclerosis present at the skull base. Skull: Small left parietal scalp contusion. Calvarium intact. Hyperostosis frontalis interna noted. Sinuses/Orbits: Globes orbital soft tissues within normal limits. Small volume pneumatized secretions noted within the left sphenoid sinus. Paranasal sinuses are otherwise clear. No mastoid effusion. Other: None. IMPRESSION: 1. No acute intracranial abnormality. 2. Small left parietal scalp contusion. No calvarial fracture. 3. Age-related  cerebral volume loss with chronic small vessel ischemic disease. Electronically Signed   By: Rise Mu M.D.   On: 12/12/2022 22:46   DG Chest Port 1 View  Result Date: 12/12/2022 CLINICAL DATA:  Left arm weakness.  History of lung cancer. EXAM: PORTABLE CHEST 1 VIEW COMPARISON:  Chest x-ray 12/11/2022.  CT chest  12/11/2022. FINDINGS: Heart is enlarged. Patient is status post TAVR. There are patchy airspace and central interstitial opacities bilaterally. There is a band of opacity in the left upper lobe appears unchanged from most recent CT. No pleural effusion or pneumothorax. No acute fractures are seen. IMPRESSION: 1. Cardiomegaly with patchy airspace and central interstitial opacities bilaterally, likely pulmonary edema. 2. Band of opacity in the left upper lobe appears unchanged from most recent CT. Electronically Signed   By: Darliss Cheney M.D.   On: 12/12/2022 21:48   DG Shoulder Left  Result Date: 12/12/2022 CLINICAL DATA:  Weakness EXAM: LEFT SHOULDER - 2+ VIEW COMPARISON:  None Available. FINDINGS: Degenerative changes of the acromioclavicular joint and glenohumeral joint are seen. No acute fracture or dislocation is noted. Linear density is noted similar to that seen on recent chest CT consistent with post radiation change. No underlying bony abnormality is noted. IMPRESSION: Degenerative change without acute abnormality. Electronically Signed   By: Alcide Clever M.D.   On: 12/12/2022 21:47    PHYSICAL EXAM Constitutional: Appears elderly  Cardiovascular: Perfusing extremities well Respiratory: Effort normal, non-labored breathing   Neuro: Mental Status: Patient is somnolent.  She briefly follows some commands intermittently.  She is able to name pinky and thumb but otherwise does not participate in language testing.  She reports that it is June or July and she is 89 years old.  When asked where she is she initially responds [place], when asked the name of the building she states it is some sort of hospital. Cranial Nerves: II: Pupils are round, and reactive to light, difficult to assess if equal due to eye closure.   III,IV, VI: Orients to examiner bilaterally does seem to orient more briskly to the right than the left V: Facial sensation is symmetric to light eyelash brush VII: Facial  movement is symmetric on grimace.  VIII: hearing is intact to voice Remainder unable to assess due to mental status Motor/sensory: Right upper extremity is at least antigravity but quickly drifts to the bed as she does not want to hold it up because she is sleepy, despite multiple attempts to test her.  Left upper extremity has at least trace movement.  She will not maintain either of her lower extremities antigravity but withdraws briskly to tickle.  No clear sensory deficit Plantars reflexes: Toes are downgoing bilaterally.  Cerebellar: Unable to assess secondary to patient's mental status Gait:  Unable to assess secondary to patient's mental status   ASSESSMENT/PLAN Ms. Tara Fields is a 89 y.o. female with history of atrial fibrillation on Eliquis, diabetes, hypertension, hyperlipidemia, coronary artery disease, aortic stenosis s/p bioprosthetic TAVR (2018), remote 70-pack-year smoking history (quit May 2021), left lower lobe cancer status postradiation, COPD, chart report of blindness in the left eye   Stroke:  Possible infarct left high right frontal parietal junction, embolic in the setting of afib.  However, need to rule out cervical spine compression Code Stroke CT head No acute abnormality. Small vessel disease. Atrophy. ASPECTS 10.    MRI severe motion degraded. Small acute infarct is still suspected at the high right frontal parietal junction.  MRI Brain pending MRI  C Spine pending MRA Head pending EEG-diffuse encephalopathy, no seizures or epileptiform discharges 2D Echo EF 20-25% LA severely dilated  LDL 49 HgbA1c 6.7 VTE prophylaxis -Lovenox Eliquis (apixaban) daily prior to admission, now on aspirin 325 mg daily. If MRI no large infarct, will resume AC, may consider switch eliquis to pradaxa given eliquis failure. Therapy recommendations: SNF Disposition: Pending  Atrial fibrillation Eliquis 5 mg twice daily PTA Currently on aspirin 325 Rate controlled If MRI  no large infarct, will resume AC, may consider switch eliquis to pradaxa given eliquis failure.  Hypertension Coronary Artery Disease Congestive Heart Failure Home meds: Metoprolol succinate, terazosin, torsemide S/p prosthetic aortic valve Long-term BP goal normotensive Low EF chronically Euvolemic  Hyperlipidemia Home meds: Atorvastatin, resumed in hospital LDL 49, goal < 70 Continue statin at discharge  Other Stroke Risk Factors Former cigarette smoker Quit May 2021  Other Active Problems S/p bioprosthetic TAVR 2018 LLL lung cancer s/p Rx  Dementia  Seroquel, trazodone Prolonged Qtc- avoid haldol Chronic pain- neck, back Tramadol    Hospital day # 0  Patient seen and examined by NP/APP with MD. MD to update note as needed.   Elmer Picker, DNP, FNP-BC [facility] Pager: [phone]  ATTENDING NOTE: I reviewed above note and agree with the assessment and plan. Pt was seen and examined.   No family at bedside.  Patient laying in bed, complaining neck pain and back pain due to arthritis.  Also complaining of left shoulder pain and bilateral lower extremity painful on movement.  Still has significant left arm weakness and mild left leg weakness.  No significant facial asymmetry.  No significant send dysarthria.  MRI severely motion degraded, however concerning for right frontal parietal high convexity infarct.  Recommend MRI C-spine and MR RI brain and MRA head under sedation with Ativan for better result.  If not able to be done, will consider CTA head and neck and repeat CT in the morning.  EF 20 to 25%, patient will need long-term anticoagulation.  INR 1.7 and patient stated compliance with medication, consider Eliquis failure.  Will recommend switch to Pradaxa if MRI or repeat CT showed no large infarct.  Currently on aspirin 325.  PT/OT recommend SNF.  Will follow  For detailed assessment and plan, please refer to above/below as I have made changes wherever  appropriate.   Marvel Plan, MD PhD Stroke Neurology 12/13/2022 5:45 PM  I discussed with Dr. Ophelia Charter. I spent additional inpatient l face-to-face time with the patient, more than 50% of which was spent in counseling and coordination of care, reviewing test results, images and medication, and discussing the diagnosis, treatment plan and potential prognosis. This patient's care requiresreview of multiple databases, neurological assessment, discussion with family, other specialists and medical decision making of high complexity.       To contact Stroke Continuity provider, please refer to [email or web address]. After hours, contact General Neurology

## 2022-12-13 NOTE — Evaluation (Signed)
Physical Therapy Evaluation Patient Details Name: Tara Fields MRN: [id number] DOB: 1927/12/08 Today's Date: 12/13/2022  History of Present Illness  89 y.o. female presents to [facility] hospital with L weakness. 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 PMH: chronic systolic CHF EF 20-25%, h/o TAVr, PAfib on eliquis, COPD, CAD, CKD 3a, aortic stenosis s/p TAVR  Clinical Impression  Pt is a limited participant in evaluation due to confusion. Pt refuses most bed mobility, only participating in rolling to remove extra sheets from under the patient. Assessment of ROM and strength is difficult by pt's impaired command following, but pt does demonstrate the ability to mobilize all extremities against gravity. Pt also lifts trunk partially off bed twice during session due to neck discomfort, demonstrating some fair core strength. Pt will benefit from further assessment later during admission, may benefit to have family present to encourage the patient as she often refused reporting her son does not want her to sit up. PT will continue to follow. Pt will benefit from short term inpatient PT services at the time of discharge.       Recommendations for follow up therapy are one component of a multi-disciplinary discharge planning process, led by the attending physician.  Recommendations may be updated based on patient status, additional functional criteria and insurance authorization.  Follow Up Recommendations Can patient physically be transported by private vehicle: No     Assistance Recommended at Discharge Frequent or constant Supervision/Assistance  Patient can return home with the following  Two people to help with walking and/or transfers;Two people to help with bathing/dressing/bathroom;Assistance with feeding;Assistance with cooking/housework;Direct supervision/assist for medications management;Direct supervision/assist for financial management;Assist for  transportation;Help with stairs or ramp for entrance    Equipment Recommendations Wheelchair (measurements PT);Hospital bed  Recommendations for Other Services       Functional Status Assessment Patient has had a recent decline in their functional status and demonstrates the ability to make significant improvements in function in a reasonable and predictable amount of time.     Precautions / Restrictions Precautions Precautions: Fall Restrictions Weight Bearing Restrictions: No      Mobility  Bed Mobility Overal bed mobility: Needs Assistance Bed Mobility: Rolling Rolling: Max assist         General bed mobility comments: pt refuses other bed mobility or transfers. Perseverates on her son [name] not wanting her to sit up    Transfers                        Ambulation/Gait                  Stairs            Wheelchair Mobility    Modified Rankin (Stroke Patients Only) Modified Rankin (Stroke Patients Only) Pre-Morbid Rankin Score: Moderate disability Modified Rankin: Severe disability     Balance Overall balance assessment:  (unable to assess as pt refuses sitting)                                           Pertinent Vitals/Pain Pain Assessment Pain Assessment: No/denies pain    Home Living Family/patient expects to be discharged to:: Assisted living [facility]) Living Arrangements: Alone Available Help at Discharge: Family;Available PRN/intermittently Type of Home: Assisted living Home Access: Level entry;Elevator       Home Layout:  One level Home Equipment: Rollator (4 wheels);Cane - single point;Shower seat;Grab bars - toilet;Grab bars - tub/shower;Hand held shower head      Prior Function Prior Level of Function : Patient poor historian/Family not available             Mobility Comments: pt is unable to provide a history at this time, is confused. Per last admission pt was ambulatory with  Rollator       Hand Dominance        Extremity/Trunk Assessment   Upper Extremity Assessment Upper Extremity Assessment: Difficult to assess due to impaired cognition;RUE deficits/detail;LUE deficits/detail RUE Deficits / Details: RUE at least 4-/5 based on observed AROM LUE Deficits / Details: L shoulder flexion at least 3/5 based on observation, pt is a limited participant in evaluation    Lower Extremity Assessment Lower Extremity Assessment: RLE deficits/detail;LLE deficits/detail;Difficult to assess due to impaired cognition RLE Deficits / Details: at least 4-/5 grossly based on observed mobility LLE Deficits / Details: at least 4-/5 grossly based on observed mobility    Cervical / Trunk Assessment Cervical / Trunk Assessment: Kyphotic  Communication   Communication: Expressive difficulties;HOH  Cognition Arousal/Alertness: Lethargic (pt maintains eyes closed for majority of session) Behavior During Therapy: WFL for tasks assessed/performed Overall Cognitive Status: Impaired/Different from baseline Area of Impairment: Orientation, Attention, Memory, Following commands, Safety/judgement, Awareness, Problem solving                 Orientation Level: Disoriented to, Time, Situation, Place Current Attention Level: Focused Memory: Decreased recall of precautions, Decreased short-term memory Following Commands: Follows one step commands inconsistently Safety/Judgement: Decreased awareness of safety, Decreased awareness of deficits Awareness: Intellectual Problem Solving: Slow processing, Decreased initiation, Requires verbal cues, Difficulty sequencing          General Comments General comments (skin integrity, edema, etc.): VSS, pt on 2L [place]    Exercises     Assessment/Plan    PT Assessment Patient needs continued PT services  PT Problem List Decreased strength;Decreased activity tolerance;Decreased balance;Decreased mobility;Decreased cognition;Decreased  knowledge of use of DME;Decreased safety awareness;Decreased knowledge of precautions;Cardiopulmonary status limiting activity       PT Treatment Interventions DME instruction;Gait training;Functional mobility training;Therapeutic activities;Therapeutic exercise;Balance training;Neuromuscular re-education;Patient/family education;Cognitive remediation;Wheelchair mobility training    PT Goals (Current goals can be found in the Care Plan section)  Acute Rehab PT Goals Patient Stated Goal: to reduce falls risk PT Goal Formulation: Patient unable to participate in goal setting Time For Goal Achievement: 12/27/22 Potential to Achieve Goals: Fair    Frequency Min 3X/week     Co-evaluation               AM-PAC PT "6 Clicks" Mobility  Outcome Measure Help needed turning from your back to your side while in a flat bed without using bedrails?: A Lot Help needed moving from lying on your back to sitting on the side of a flat bed without using bedrails?: Total Help needed moving to and from a bed to a chair (including a wheelchair)?: Total Help needed standing up from a chair using your arms (e.g., wheelchair or bedside chair)?: Total Help needed to walk in hospital room?: Total Help needed climbing 3-5 steps with a railing? : Total 6 Click Score: 7    End of Session Equipment Utilized During Treatment: Oxygen Activity Tolerance: Patient limited by lethargy;Other (comment) (pt often refuses portions of assessment and requires max encouragement for participation) Patient left: in bed;with call bell/phone within reach Nurse  Communication: Mobility status PT Visit Diagnosis: Other abnormalities of gait and mobility (R26.89);Muscle weakness (generalized) (M62.81);Difficulty in walking, not elsewhere classified (R26.2)    Time: 5621-3086 PT Time Calculation (min) (ACUTE ONLY): 18 min   Charges:   PT Evaluation $PT Eval Low Complexity: 1 Low          Arlyss Gandy, PT, DPT Acute  Rehabilitation Office [phone]   Arlyss Gandy 12/13/2022, 8:56 AM

## 2022-12-13 NOTE — Progress Notes (Signed)
Patient attempted two previous times.  Both times patient had meds and continued to move and kept pulling at imaging coil and screaming at everyone.  It is suggested that general anesthesia be considered for this patient since meds seem not to work.

## 2022-12-13 NOTE — ED Notes (Signed)
Patient complaining of substernal chest tightness. Provider notified

## 2022-12-13 NOTE — H&P (Addendum)
History and Physical    Patient: Tara Fields NWG:[id number] DOB: 07-02-1928 DOA: 12/12/2022 DOS: the patient was seen and examined on 12/13/2022 PCP: Shirline Frees, NP  Patient coming from: [facility]; NOK: [facility] [place], [phone]   Chief Complaint: weakness  HPI: Tara Fields is a 89 y.o. female with medical history significant of chronic combined CHF, afib; dementia; CAD; COPD; DM; HTN; and HLD presenting with weakness.  She was seen in the ER on 4/12 for increased confusion, worsening memory and again on 4/27 after an unwitnessed fall.   The patient reports hurting all over and is overall not able to provide significant history.  She has a marked LUE hemiparesis.  I spoke with her son who reports that a couple of days ago she fell at her facility, uses a walker but has knee instability.  She was down for a few hours.  She was brought to the ER.  Xrays and CT unremarkable and she was given pain medications, slept for hours without complaining.  He took her home and she complained a lot about pain.  Yesterday, he was with her most of the day but again slipped out of her chair and they called 911 to get help back up about 3pm.  Shortly after that, he noticed that she couldn't use her left arm and almost didn't notice it was there.  She had too much difficulty getting her up to the bedside commode.      ER Course:  Carryover, per Dr. [name]:  89 yo female with acute CVA on MRI brain.      Review of Systems: Very limited by patient's current cognitive status  Past Medical History:  Diagnosis Date   Acute cystitis without hematuria 02/03/2022   Acute on chronic combined systolic and diastolic CHF (congestive heart failure) (HCC)    Acute on chronic systolic (congestive) heart failure (HCC) 09/07/2022   Aortic stenosis    s/p TAVR 26 mm Edwards Sapien 3 THV July 2020   Atrial fibrillation with rapid ventricular response (HCC) 09/02/2021   CAD  (coronary artery disease)    COPD (chronic obstructive pulmonary disease) (HCC)    COVID-19 virus infection 06/07/2021   Diabetes mellitus (HCC)    Diverticulitis    Elevated troponin 09/02/2021   GERD (gastroesophageal reflux disease)    GI bleed 04/13/2021   HTN (hypertension)    Hyperlipidemia    Lab test positive for detection of COVID-19 virus    Legally blind in left eye, as defined in [place]    Tobacco abuse    UTI (urinary tract infection) 11/10/2021   Past Surgical History:  Procedure Laterality Date   ABDOMINAL HYSTERECTOMY     APPENDECTOMY     BACK SURGERY     CATARACT EXTRACTION, BILATERAL     CHOLECYSTECTOMY     COLONOSCOPY WITH PROPOFOL N/A 04/16/2021   Procedure: COLONOSCOPY WITH PROPOFOL;  Surgeon: Rachael Fee, MD;  Location: [facility];  Service: Gastroenterology;  Laterality: N/A;   ESOPHAGOGASTRODUODENOSCOPY (EGD) WITH PROPOFOL N/A 04/15/2021   Procedure: ESOPHAGOGASTRODUODENOSCOPY (EGD) WITH PROPOFOL;  Surgeon: Sherrilyn Rist, MD;  Location: [facility];  Service: Gastroenterology;  Laterality: N/A;   Social History:  reports that she quit smoking about 2 years ago. Her smoking use included cigarettes. She has a 70.00 pack-year smoking history. She has never used smokeless tobacco. She reports that she does not drink alcohol and does not use drugs.  Allergies  Allergen Reactions   Amlodipine Besy-Benazepril  Hcl Cough   Hctz [Hydrochlorothiazide] Other (See Comments)    Per family, every diuretic the patient has been on flushes out her out and ultimately results in CRAMPING/JAUNDICE (added potassium might help)   Other Other (See Comments)    History of diverticulitis- CANNOT TOLERATE NUTS, CORN, AND ANY FOODS NOT EASILY DIGESTED- the patient AVOIDS these   Cyclobenzaprine Other (See Comments)    Confusion- does not tolerate well    Diclofenac Sodium Diarrhea   Hydralazine Other (See Comments)    Per family, every diuretic the patient has been on  flushes out her out and ultimately results in CRAMPING/JAUNDICE (added potassium might help)   Methylprednisolone Other (See Comments)    Medrol/CORTICOSTEROIDS = A LOT OF ENERGY Delusions    Oxycodone Other (See Comments)    Patient becomes very disorientated     Family History  Problem Relation Age of Onset   Diabetes Mother    Stroke Brother    Cerebral palsy Brother    Coronary artery disease Neg Hx     Prior to Admission medications   Medication Sig Start Date End Date Taking? Authorizing Provider  traMADol (ULTRAM) 50 MG tablet TAKE 1 TABLET BY MOUTH THREE TIMES A DAY 11/03/22  Yes Nafziger, Kandee Keen, NP  acetaminophen (TYLENOL) 500 MG tablet Take 500 mg by mouth 3 (three) times daily as needed for moderate pain. Take with Tramadol    [provider]  atorvastatin (LIPITOR) 40 MG tablet TAKE 1 TABLET BY MOUTH DAILY AT 6 PM. Patient taking differently: Take 40 mg by mouth daily. 06/18/22   Nafziger, Kandee Keen, NP  cholecalciferol (VITAMIN D3) 25 MCG (1000 UNIT) tablet Take 1,000 Units by mouth daily.    [provider]  Coenzyme Q10 (CO Q-10) 300 MG CAPS Take 300 mg by mouth in the morning.    [provider]  donepezil (ARICEPT) 5 MG tablet Take 1 tablet (5 mg total) by mouth at bedtime. Patient not taking: Reported on 12/11/2022 12/07/22   Shirline Frees, NP  ELIQUIS 5 MG TABS tablet TAKE 1 TABLET BY MOUTH TWICE A DAY 09/01/22   Nafziger, Kandee Keen, NP  ferrous gluconate (FERGON) 324 MG tablet TAKE 1 TABLET BY MOUTH DAILY WITH BREAKFAST 09/21/22   Nafziger, Kandee Keen, NP  fluticasone (FLONASE) 50 MCG/ACT nasal spray Place 2 sprays into both nostrils daily as needed for allergies or rhinitis.    [provider]  Lidocaine-Menthol, Spray, 4-1 % LIQD Apply 1 spray topically daily as needed (back/knee pain/restless legs).    [provider]  Magnesium 200 MG TABS Take 200 mg by mouth every other day.    [provider]  metoprolol succinate (TOPROL XL) 50  MG 24 hr tablet Take 1 tablet (50 mg total) by mouth 2 (two) times daily. Take with or immediately following a meal. 12/03/22   Kathleene Hazel, MD  Misc Natural Products (OSTEO BI-FLEX ADV JOINT SHIELD PO) Take 1 tablet by mouth daily.    [provider]  Multiple Vitamins-Minerals (CENTRUM MINIS WOMEN 50+ PO) Take 1 tablet by mouth daily.    [provider]  Omega-3 Fatty Acids (FISH OIL) 1200 MG CAPS Take 1 capsule by mouth daily.    [provider]  ondansetron (ZOFRAN) 4 MG tablet Take 4 mg by mouth every 6 (six) hours as needed for nausea or vomiting.    [provider]  potassium chloride 20 MEQ/15ML (10%) SOLN TAKE 15 MLS (20 MEQ TOTAL) BY MOUTH DAILY. Patient taking  differently: Take 20 mEq by mouth daily. 10ml 09/16/22 12/15/22  Dyann Kief, PA-C  psyllium (METAMUCIL) 58.6 % powder Take 1 packet by mouth daily as needed (for constipation).    [provider]  QUEtiapine (SEROQUEL) 25 MG tablet Take 12.5 mg by mouth at bedtime.    [provider]  sodium chloride (OCEAN) 0.65 % SOLN nasal spray Place 1 spray into both nostrils as needed for congestion.    [provider]  spironolactone (ALDACTONE) 25 MG tablet Take 12.5 mg by mouth daily.    [provider]  terazosin (HYTRIN) 10 MG capsule TAKE 1 CAPSULE BY MOUTH AT BEDTIME. 05/11/22   Nafziger, Kandee Keen, NP  torsemide (DEMADEX) 20 MG tablet Take 2 tablets (40 mg total) by mouth daily. Incase of weight gain 2 to 3 lbs in 24 hrs or 5 lbs in 7 days, take 2 tablets twice a day until weight back to baseline. Patient taking differently: Take 20 mg by mouth 2 (two) times daily. Incase of weight gain 2 to 3 lbs in 24 hrs or 5 lbs in 7 days, take 2 tablets twice a day until weight back to baseline. 09/09/22   Arrien, York Ram, MD  traZODone (DESYREL) 50 MG tablet Take 25 mg by mouth at bedtime as needed for sleep.    [provider]  vitamin B-12  (CYANOCOBALAMIN) 1000 MCG tablet Take 1 tablet (1,000 mcg total) by mouth daily. 04/16/21   Meredeth Ide, MD    Physical Exam: Vitals:   12/13/22 0830 12/13/22 0900 12/13/22 1000 12/13/22 1139  BP: (!) 162/90 (!) 156/108    Pulse: (!) 54 (!) 59 65   Resp: 14 17 14    Temp:    98.1 F (36.7 C)  TempSrc:    Oral  SpO2: 96% 92% 97%   Weight:      Height:       General:  Appears calm and comfortable and is in NAD Eyes:  Eyes closed, normal lids ENT:  grossly normal lips & tongue, mmm Neck:  no LAD, masses or thyromegaly Cardiovascular:  RRR, no m/r/g. No LE edema.  Respiratory:   CTA bilaterally with no wheezes/rales/rhonchi.  Normal respiratory effort. Abdomen:  soft, NT, ND Skin:  no rash or induration seen on limited exam Musculoskeletal:  LUE > LLE is mostly flaccid, significantly decreased strength (able to lift proximally but not distally against gravity) Psychiatric:  blunted mood and affect, speech sparse, able to provide minimal history Neurologic:  L-sided hemiparesis, limited by cognition    Radiological Exams on Admission: Independently reviewed - see discussion in A/P where applicable  EEG adult  Result Date: 12/13/2022 Charlsie Quest, MD     12/13/2022 11:15 AM Patient Name: Graham Doukas MRN: [id number] Epilepsy Attending: Charlsie Quest Referring Physician/Provider: Gordy Councilman, MD Date: 12/13/2022 Duration: 23.34 mins Patient history: 89 year old female with left frontal stroke getting EEG to evaluate for seizure. Level of alertness: Awake AEDs during EEG study: None Technical aspects: This EEG study was done with scalp electrodes positioned according to the 10-20 International system of electrode placement. Electrical activity was reviewed with band pass filter of 1-70Hz , sensitivity of 7 uV/mm, display speed of 59mm/sec with a 60Hz  notched filter applied as appropriate. EEG data were recorded continuously and digitally stored.  Video monitoring was  available and reviewed as appropriate. Description: The posterior dominant rhythm consists of 7.5 Hz activity of moderate voltage (25-35 uV) seen predominantly in posterior head regions, symmetric  and reactive to eye opening and eye closing. EEG showed intermittent generalized 3 to 6 Hz theta-delta slowing. Hyperventilation and photic stimulation were not performed.   ABNORMALITY - Intermittent slow, generalized IMPRESSION: This study is suggestive of mild diffuse encephalopathy. No seizures or epileptiform discharges were seen throughout the recording. Charlsie Quest   MR BRAIN WO CONTRAST  Result Date: 12/13/2022 CLINICAL DATA:  Stroke workup EXAM: MRI HEAD WITHOUT CONTRAST TECHNIQUE: Multiplanar, multiecho pulse sequences of the brain and surrounding structures were obtained without intravenous contrast. COMPARISON:  Head CT from yesterday FINDINGS: Only diffusion imaging was acquired, and very much motion degraded, due to patient condition despite sedative medication. Especially on coronal acquisition small areas of cortex and white matter infarct or suspected at high right frontal lobe. No hydrocephalus or gross midline shift. IMPRESSION: Very truncated and motion degraded study. Small acute infarct is still suspected at the high right frontal parietal junction. Electronically Signed   By: Tiburcio Pea M.D.   On: 12/13/2022 05:10   CT Head Wo Contrast  Result Date: 12/12/2022 CLINICAL DATA:  Initial evaluation for neuro deficit, stroke suspected. EXAM: CT HEAD WITHOUT CONTRAST TECHNIQUE: Contiguous axial images were obtained from the base of the skull through the vertex without intravenous contrast. RADIATION DOSE REDUCTION: This exam was performed according to the departmental dose-optimization program which includes automated exposure control, adjustment of the mA and/or kV according to patient size and/or use of iterative reconstruction technique. COMPARISON:  Prior study from 12/11/2022.  FINDINGS: Brain: Age-related cerebral volume loss with chronic small vessel ischemic disease. No acute intracranial hemorrhage. No acute large vessel territory infarct. No mass lesion or midline shift. No hydrocephalus or extra-axial fluid collection. Prominent dural base calcifications noted about the falx. Vascular: No abnormal hyperdense vessel. Calcified atherosclerosis present at the skull base. Skull: Small left parietal scalp contusion. Calvarium intact. Hyperostosis frontalis interna noted. Sinuses/Orbits: Globes orbital soft tissues within normal limits. Small volume pneumatized secretions noted within the left sphenoid sinus. Paranasal sinuses are otherwise clear. No mastoid effusion. Other: None. IMPRESSION: 1. No acute intracranial abnormality. 2. Small left parietal scalp contusion. No calvarial fracture. 3. Age-related cerebral volume loss with chronic small vessel ischemic disease. Electronically Signed   By: Rise Mu M.D.   On: 12/12/2022 22:46   DG Chest Port 1 View  Result Date: 12/12/2022 CLINICAL DATA:  Left arm weakness.  History of lung cancer. EXAM: PORTABLE CHEST 1 VIEW COMPARISON:  Chest x-ray 12/11/2022.  CT chest 12/11/2022. FINDINGS: Heart is enlarged. Patient is status post TAVR. There are patchy airspace and central interstitial opacities bilaterally. There is a band of opacity in the left upper lobe appears unchanged from most recent CT. No pleural effusion or pneumothorax. No acute fractures are seen. IMPRESSION: 1. Cardiomegaly with patchy airspace and central interstitial opacities bilaterally, likely pulmonary edema. 2. Band of opacity in the left upper lobe appears unchanged from most recent CT. Electronically Signed   By: Darliss Cheney M.D.   On: 12/12/2022 21:48   DG Shoulder Left  Result Date: 12/12/2022 CLINICAL DATA:  Weakness EXAM: LEFT SHOULDER - 2+ VIEW COMPARISON:  None Available. FINDINGS: Degenerative changes of the acromioclavicular joint and  glenohumeral joint are seen. No acute fracture or dislocation is noted. Linear density is noted similar to that seen on recent chest CT consistent with post radiation change. No underlying bony abnormality is noted. IMPRESSION: Degenerative change without acute abnormality. Electronically Signed   By: Alcide Clever M.D.   On: 12/12/2022  21:47   CT T-SPINE NO CHARGE  Result Date: 12/11/2022 CLINICAL DATA:  Trauma, history of lung cancer, * Tracking Code: BO * EXAM: CT CHEST, ABDOMEN AND PELVIS WITH CONTRAST CT THORACIC AND LUMBAR SPINE WITH CONTRAST TECHNIQUE: Multidetector CT imaging of the chest, abdomen and pelvis was performed following the standard protocol during bolus administration of intravenous contrast. Multidetector CT imaging of the thoracic and lumbar spine was performed following the standard protocol during bolus administration of intravenous contrast. RADIATION DOSE REDUCTION: This exam was performed according to the departmental dose-optimization program which includes automated exposure control, adjustment of the mA and/or kV according to patient size and/or use of iterative reconstruction technique. COMPARISON:  CT chest abdomen pelvis, 04/23/2022 FINDINGS: CT CHEST FINDINGS Cardiovascular: Aortic atherosclerosis. Aortic valve stent endograft. Cardiomegaly. Enlargement of the main pulmonary artery measuring up to 3.6 cm in caliber. Three-vessel coronary artery calcifications. No pericardial effusion. Mediastinum/Nodes: No enlarged mediastinal, hilar, or axillary lymph nodes. Thyroid gland, trachea, and esophagus demonstrate no significant findings. Lungs/Pleura: Moderate centrilobular emphysema. Unchanged post treatment/post radiation appearance of the suprahilar left lung (series 6, image 40). Small bilateral pleural effusions and diffuse interlobular septal thickening throughout. Musculoskeletal: No chest wall mass or suspicious osseous lesions identified. CT ABDOMEN PELVIS FINDINGS  Hepatobiliary: No focal liver abnormality is seen. Status post cholecystectomy. Unchanged postoperative biliary ductal dilatation. Pancreas: Unremarkable. No pancreatic ductal dilatation or surrounding inflammatory changes. Spleen: Normal in size without significant abnormality. Adrenals/Urinary Tract: Unchanged, definitively benign left adrenal adenoma, for which no further follow-up or characterization is required (series 4, image 63). Kidneys are normal, without renal calculi, solid lesion, or hydronephrosis. Bladder is unremarkable. Stomach/Bowel: Stomach is within normal limits. Appendix is not clearly visualized. No evidence of bowel wall thickening, distention, or inflammatory changes. Descending and sigmoid diverticulosis. Vascular/Lymphatic: Aortic atherosclerosis. No enlarged abdominal or pelvic lymph nodes. Reproductive: No mass or other abnormality. Other: Small broad-based midline ventral hernia (series 4, image 80). No ascites. Musculoskeletal: No acute osseous findings. CT THORACIC AND LUMBAR SPINE FINDINGS Alignment: Normal thoracic kyphosis. Degenerative and postoperative straightening of the normal lumbar lordosis. Vertebral bodies: Osteopenia.  No fracture or dislocation. Disc spaces: Status post posterior fusion of L4 through S1. Moderate disc space height loss and osteophytosis throughout the thoracic and lumbar spine, worst at L1 through L4. Paraspinous soft tissues: Unremarkable. IMPRESSION: 1. No CT evidence of acute traumatic injury to the chest, abdomen, or pelvis. 2. No fracture or dislocation of the thoracic or lumbar spine. 3. Unchanged post treatment/post radiation appearance of the suprahilar left lung. No evidence of lymphadenopathy or metastatic disease in the chest, abdomen, or pelvis. 4. Small bilateral pleural effusions and diffuse interlobular septal thickening throughout the lungs, consistent with pulmonary edema. 5. Cardiomegaly and coronary artery disease. 6. Enlargement of  the main pulmonary artery, as can be seen in pulmonary hypertension. 7. Emphysema. Aortic Atherosclerosis (ICD10-I70.0) and Emphysema (ICD10-J43.9). Electronically Signed   By: Jearld Lesch M.D.   On: 12/11/2022 16:56   CT L-SPINE NO CHARGE  Result Date: 12/11/2022 CLINICAL DATA:  Trauma, history of lung cancer, * Tracking Code: BO * EXAM: CT CHEST, ABDOMEN AND PELVIS WITH CONTRAST CT THORACIC AND LUMBAR SPINE WITH CONTRAST TECHNIQUE: Multidetector CT imaging of the chest, abdomen and pelvis was performed following the standard protocol during bolus administration of intravenous contrast. Multidetector CT imaging of the thoracic and lumbar spine was performed following the standard protocol during bolus administration of intravenous contrast. RADIATION DOSE REDUCTION: This exam was performed according to the  departmental dose-optimization program which includes automated exposure control, adjustment of the mA and/or kV according to patient size and/or use of iterative reconstruction technique. COMPARISON:  CT chest abdomen pelvis, 04/23/2022 FINDINGS: CT CHEST FINDINGS Cardiovascular: Aortic atherosclerosis. Aortic valve stent endograft. Cardiomegaly. Enlargement of the main pulmonary artery measuring up to 3.6 cm in caliber. Three-vessel coronary artery calcifications. No pericardial effusion. Mediastinum/Nodes: No enlarged mediastinal, hilar, or axillary lymph nodes. Thyroid gland, trachea, and esophagus demonstrate no significant findings. Lungs/Pleura: Moderate centrilobular emphysema. Unchanged post treatment/post radiation appearance of the suprahilar left lung (series 6, image 40). Small bilateral pleural effusions and diffuse interlobular septal thickening throughout. Musculoskeletal: No chest wall mass or suspicious osseous lesions identified. CT ABDOMEN PELVIS FINDINGS Hepatobiliary: No focal liver abnormality is seen. Status post cholecystectomy. Unchanged postoperative biliary ductal dilatation.  Pancreas: Unremarkable. No pancreatic ductal dilatation or surrounding inflammatory changes. Spleen: Normal in size without significant abnormality. Adrenals/Urinary Tract: Unchanged, definitively benign left adrenal adenoma, for which no further follow-up or characterization is required (series 4, image 63). Kidneys are normal, without renal calculi, solid lesion, or hydronephrosis. Bladder is unremarkable. Stomach/Bowel: Stomach is within normal limits. Appendix is not clearly visualized. No evidence of bowel wall thickening, distention, or inflammatory changes. Descending and sigmoid diverticulosis. Vascular/Lymphatic: Aortic atherosclerosis. No enlarged abdominal or pelvic lymph nodes. Reproductive: No mass or other abnormality. Other: Small broad-based midline ventral hernia (series 4, image 80). No ascites. Musculoskeletal: No acute osseous findings. CT THORACIC AND LUMBAR SPINE FINDINGS Alignment: Normal thoracic kyphosis. Degenerative and postoperative straightening of the normal lumbar lordosis. Vertebral bodies: Osteopenia.  No fracture or dislocation. Disc spaces: Status post posterior fusion of L4 through S1. Moderate disc space height loss and osteophytosis throughout the thoracic and lumbar spine, worst at L1 through L4. Paraspinous soft tissues: Unremarkable. IMPRESSION: 1. No CT evidence of acute traumatic injury to the chest, abdomen, or pelvis. 2. No fracture or dislocation of the thoracic or lumbar spine. 3. Unchanged post treatment/post radiation appearance of the suprahilar left lung. No evidence of lymphadenopathy or metastatic disease in the chest, abdomen, or pelvis. 4. Small bilateral pleural effusions and diffuse interlobular septal thickening throughout the lungs, consistent with pulmonary edema. 5. Cardiomegaly and coronary artery disease. 6. Enlargement of the main pulmonary artery, as can be seen in pulmonary hypertension. 7. Emphysema. Aortic Atherosclerosis (ICD10-I70.0) and Emphysema  (ICD10-J43.9). Electronically Signed   By: Jearld Lesch M.D.   On: 12/11/2022 16:56   CT CHEST ABDOMEN PELVIS W CONTRAST  Result Date: 12/11/2022 CLINICAL DATA:  Trauma, history of lung cancer, * Tracking Code: BO * EXAM: CT CHEST, ABDOMEN AND PELVIS WITH CONTRAST CT THORACIC AND LUMBAR SPINE WITH CONTRAST TECHNIQUE: Multidetector CT imaging of the chest, abdomen and pelvis was performed following the standard protocol during bolus administration of intravenous contrast. Multidetector CT imaging of the thoracic and lumbar spine was performed following the standard protocol during bolus administration of intravenous contrast. RADIATION DOSE REDUCTION: This exam was performed according to the departmental dose-optimization program which includes automated exposure control, adjustment of the mA and/or kV according to patient size and/or use of iterative reconstruction technique. COMPARISON:  CT chest abdomen pelvis, 04/23/2022 FINDINGS: CT CHEST FINDINGS Cardiovascular: Aortic atherosclerosis. Aortic valve stent endograft. Cardiomegaly. Enlargement of the main pulmonary artery measuring up to 3.6 cm in caliber. Three-vessel coronary artery calcifications. No pericardial effusion. Mediastinum/Nodes: No enlarged mediastinal, hilar, or axillary lymph nodes. Thyroid gland, trachea, and esophagus demonstrate no significant findings. Lungs/Pleura: Moderate centrilobular emphysema. Unchanged post treatment/post radiation appearance  of the suprahilar left lung (series 6, image 40). Small bilateral pleural effusions and diffuse interlobular septal thickening throughout. Musculoskeletal: No chest wall mass or suspicious osseous lesions identified. CT ABDOMEN PELVIS FINDINGS Hepatobiliary: No focal liver abnormality is seen. Status post cholecystectomy. Unchanged postoperative biliary ductal dilatation. Pancreas: Unremarkable. No pancreatic ductal dilatation or surrounding inflammatory changes. Spleen: Normal in size without  significant abnormality. Adrenals/Urinary Tract: Unchanged, definitively benign left adrenal adenoma, for which no further follow-up or characterization is required (series 4, image 63). Kidneys are normal, without renal calculi, solid lesion, or hydronephrosis. Bladder is unremarkable. Stomach/Bowel: Stomach is within normal limits. Appendix is not clearly visualized. No evidence of bowel wall thickening, distention, or inflammatory changes. Descending and sigmoid diverticulosis. Vascular/Lymphatic: Aortic atherosclerosis. No enlarged abdominal or pelvic lymph nodes. Reproductive: No mass or other abnormality. Other: Small broad-based midline ventral hernia (series 4, image 80). No ascites. Musculoskeletal: No acute osseous findings. CT THORACIC AND LUMBAR SPINE FINDINGS Alignment: Normal thoracic kyphosis. Degenerative and postoperative straightening of the normal lumbar lordosis. Vertebral bodies: Osteopenia.  No fracture or dislocation. Disc spaces: Status post posterior fusion of L4 through S1. Moderate disc space height loss and osteophytosis throughout the thoracic and lumbar spine, worst at L1 through L4. Paraspinous soft tissues: Unremarkable. IMPRESSION: 1. No CT evidence of acute traumatic injury to the chest, abdomen, or pelvis. 2. No fracture or dislocation of the thoracic or lumbar spine. 3. Unchanged post treatment/post radiation appearance of the suprahilar left lung. No evidence of lymphadenopathy or metastatic disease in the chest, abdomen, or pelvis. 4. Small bilateral pleural effusions and diffuse interlobular septal thickening throughout the lungs, consistent with pulmonary edema. 5. Cardiomegaly and coronary artery disease. 6. Enlargement of the main pulmonary artery, as can be seen in pulmonary hypertension. 7. Emphysema. Aortic Atherosclerosis (ICD10-I70.0) and Emphysema (ICD10-J43.9). Electronically Signed   By: Jearld Lesch M.D.   On: 12/11/2022 16:56   CT HEAD WO CONTRAST  Result  Date: 12/11/2022 CLINICAL DATA:  Trauma EXAM: CT HEAD WITHOUT CONTRAST CT CERVICAL SPINE WITHOUT CONTRAST TECHNIQUE: Multidetector CT imaging of the head and cervical spine was performed following the standard protocol without intravenous contrast. Multiplanar CT image reconstructions of the cervical spine were also generated. RADIATION DOSE REDUCTION: This exam was performed according to the departmental dose-optimization program which includes automated exposure control, adjustment of the mA and/or kV according to patient size and/or use of iterative reconstruction technique. COMPARISON:  11/26/2022 FINDINGS: CT HEAD FINDINGS Brain: No evidence of acute infarction, hemorrhage, hydrocephalus, extra-axial collection or mass lesion/mass effect. Periventricular white matter hypodensity. Vascular: No hyperdense vessel or unexpected calcification. Skull: Hyperostosis frontalis. Negative for fracture or focal lesion. Sinuses/Orbits: No acute finding. Other: Soft tissue contusion of the left scalp vertex (series 3, image 27). CT CERVICAL SPINE FINDINGS Alignment: Normal. Skull base and vertebrae: No acute fracture. No primary bone lesion or focal pathologic process. Soft tissues and spinal canal: No prevertebral fluid or swelling. No visible canal hematoma. Disc levels: Severe multilevel disc and facet degenerative disease and osteophytosis of the cervical spine, worst from C5-C7. Upper chest: Negative. Other: None. IMPRESSION: 1. No acute intracranial pathology. Small-vessel white matter disease. 2. Soft tissue contusion of the left scalp vertex. 3. No fracture or static subluxation of the cervical spine. 4. Severe multilevel disc and facet degenerative disease of the cervical spine, worst from C5-C7. Electronically Signed   By: Jearld Lesch M.D.   On: 12/11/2022 16:25   CT CERVICAL SPINE WO CONTRAST  Result Date: 12/11/2022  CLINICAL DATA:  Trauma EXAM: CT HEAD WITHOUT CONTRAST CT CERVICAL SPINE WITHOUT CONTRAST  TECHNIQUE: Multidetector CT imaging of the head and cervical spine was performed following the standard protocol without intravenous contrast. Multiplanar CT image reconstructions of the cervical spine were also generated. RADIATION DOSE REDUCTION: This exam was performed according to the departmental dose-optimization program which includes automated exposure control, adjustment of the mA and/or kV according to patient size and/or use of iterative reconstruction technique. COMPARISON:  11/26/2022 FINDINGS: CT HEAD FINDINGS Brain: No evidence of acute infarction, hemorrhage, hydrocephalus, extra-axial collection or mass lesion/mass effect. Periventricular white matter hypodensity. Vascular: No hyperdense vessel or unexpected calcification. Skull: Hyperostosis frontalis. Negative for fracture or focal lesion. Sinuses/Orbits: No acute finding. Other: Soft tissue contusion of the left scalp vertex (series 3, image 27). CT CERVICAL SPINE FINDINGS Alignment: Normal. Skull base and vertebrae: No acute fracture. No primary bone lesion or focal pathologic process. Soft tissues and spinal canal: No prevertebral fluid or swelling. No visible canal hematoma. Disc levels: Severe multilevel disc and facet degenerative disease and osteophytosis of the cervical spine, worst from C5-C7. Upper chest: Negative. Other: None. IMPRESSION: 1. No acute intracranial pathology. Small-vessel white matter disease. 2. Soft tissue contusion of the left scalp vertex. 3. No fracture or static subluxation of the cervical spine. 4. Severe multilevel disc and facet degenerative disease of the cervical spine, worst from C5-C7. Electronically Signed   By: Jearld Lesch M.D.   On: 12/11/2022 16:25   DG Pelvis Portable  Result Date: 12/11/2022 CLINICAL DATA:  Trauma. EXAM: PORTABLE PELVIS 1-2 VIEWS COMPARISON:  None Available. FINDINGS: There is no evidence of pelvic fracture or diastasis. No pelvic bone lesions are seen. Severe osteoarthritic  changes of the right hip, moderate osteoarthritic changes of the left hip. Vascular calcifications noted. Lower lumbosacral spine fusion. IMPRESSION: 1. No acute fracture or diastasis of the pelvis. 2. Severe osteoarthritic changes of the right hip, moderate osteoarthritic changes of the left hip. Electronically Signed   By: Ted Mcalpine M.D.   On: 12/11/2022 13:28   DG Chest Port 1 View  Result Date: 12/11/2022 CLINICAL DATA:  Fall. EXAM: PORTABLE CHEST 1 VIEW COMPARISON:  11/26/2022 FINDINGS: The cardio pericardial silhouette is enlarged. Status post TAVR. Interstitial markings are diffusely coarsened with chronic features. Streaky left parahilar density is likely atelectasis and mildly progressive in the interval. Bones are diffusely demineralized. IMPRESSION: Streaky left parahilar density is likely atelectasis and mildly progressive in the interval. No evidence for pneumothorax or pleural effusion. Electronically Signed   By: Kennith Center M.D.   On: 12/11/2022 13:27    EKG: Independently reviewed.  NSR with rate 67; prolonged QTc 560; LBBB   Labs on Admission: I have personally reviewed the available labs and imaging studies at the time of the admission.  Pertinent labs:    Glucose 114 BUN 36/Creatinine 1.25/GFR 40 - stable HS troponin 20, 19 WBC 6.1 Hgb 11.1   Assessment and Plan: Principal Problem:   Acute CVA (cerebrovascular accident) (HCC) Active Problems:   Persistent atrial fibrillation (HCC)   Stage 3b chronic kidney disease (CKD) (HCC)   HTN (hypertension)   Type 2 diabetes mellitus with hyperlipidemia (HCC)   Cognitive deficits   DNR (do not resuscitate)   Chronic systolic congestive heart failure (HCC)   Chronic pain   Prolonged QT interval    CVA -Patient with recurrent falls and then acute onset of LUE > LLE weakness -Concerning for CVA -Aspirin has been given to reduce  stroke mortality and decrease morbidity -Will admit for CVA evaluation -Telemetry  monitoring -MRI was motion degraded; neurology team has ordered repeat MRI/MRA of brain as well as C-spine MRI -Echo was done last month, will not repeat at this time -Risk stratification with FLP, A1c -Neurology consult -PT/OT/ST/Nutrition Consults -TOC team consult for placement  HTN -Allow permissive HTN for now -Treat BP only if >220/120, and then with goal of 15% reduction -Hold Toprol XL, terazosin and plan to restart in 48-72 hours   HLD -Check FLP -Continue Lipitor 40 mg daily, fish oil  Chronic pain  -I have reviewed this patient in the [place] Controlled Substances Reporting System.  She is receiving medications from only one provider and appears to be taking them as prescribed. -She is not at particularly high risk of opioid misuse, diversion, or overdose.  -Continue tramadol  Chronic combined CHF -Euvolemic on exam, although imaging shows some concern for volume overload -No hypoxia (has h/o COPD) -Will hold diuresis at this time - including home spironolactone and torsemide  DM -recent A1c was 6.7 -She is not on home medications -Given her age, her A1c goal is <8 -Will hold accuchecks and SSI at this time  Afib -Rate controlled  with Toprol XL -Continue Eliquis  Dementia -No longer taking donepezil -Continue Seroquel, trazodone -Delirium precautions  Prolonged QTc -Will attempt to avoid QT-prolonging medications such as PPI, nausea meds, SSRIs -Repeat EKG in AM   Stage 3b CKD -Appears to be stable at this time -Attempt to avoid nephrotoxic medications -Recheck BMP in AM   DNR -DNR paperwork present at the bedside -Depending on stroke severity, her son would consider transition to comfort only -Will order palliative care consult    Advance Care Planning:   Code Status: DNR   Consults: Neurology; Palliative care; PT/OT/ST; nutrition; TOC team  DVT Prophylaxis: Eliquis  Family Communication: None present; I spoke with her son by telephone following  admission  Severity of Illness: The appropriate patient status for this patient is INPATIENT. Inpatient status is judged to be reasonable and necessary in order to provide the required intensity of service to ensure the patient's safety. The patient's presenting symptoms, physical exam findings, and initial radiographic and laboratory data in the context of their chronic comorbidities is felt to place them at high risk for further clinical deterioration. Furthermore, it is not anticipated that the patient will be medically stable for discharge from the hospital within 2 midnights of admission.   * I certify that at the point of admission it is my clinical judgment that the patient will require inpatient hospital care spanning beyond 2 midnights from the point of admission due to high intensity of service, high risk for further deterioration and high frequency of surveillance required.*  Author: Jonah Blue, MD 12/13/2022 12:16 PM  For on call review [email or web address].

## 2022-12-13 NOTE — Consult Note (Signed)
Neurology Consultation Reason for Consult: Stroke on MRI Requesting Physician: Drema Pry  CC: Left upper extremity weakness  History is obtained from: Primarily chart review, unable to reach family at the time of my evaluation  HPI: Tara Fields is a 89 y.o. female with a past medical history significant for atrial fibrillation on Eliquis, diabetes, hypertension, hyperlipidemia, coronary artery disease, aortic stenosis s/p bioprosthetic TAVR (2018), remote 70-pack-year smoking history (quit May 2021), left lower lobe cancer status postradiation, COPD, chart report of blindness in the left eye  She recently presented to the ED on 4/27 at 1 PM for an unwitnessed fall.  She received fentanyl and Ativan to tolerate imaging, and was subsequently discharged with no focal deficits noted during her evaluation.  She was somewhat sleepy and confused at discharge felt to be secondary to sedating effects of medication.  However on 4/28 it was discovered that she had left arm and possibly also left leg weakness.  Therefore she was brought to the ED for further evaluation.  On review of chart there has been some longstanding concern about her cognition although she is living in an independent living facility.  As far back as 12/21/2020 she had in-hospital delirium.  She had similar confusion 11/09/2021 while hospitalized.  In November 2023 palliative care note it was noted that she had stated to take her medications but was found to have her metoprolol still on her pillbox, and she was noted to have some paranoia on PCP evaluation 06/22/2022, however she was noted to have refused Seroquel to manage the symptoms.  On 09/14/2022 Abilify was suggested for agitation but she has not been taking this medication.  On April 23 she was started on Aricept for confusion particularly day/night confusion by her PCP after she had presented to the ED on April 12 for day/night confusion.  In particular a MyChart message  presumably from her son on April 12 notes that she would call him "panicking over taking pills" and that in the past this had primarily occurred at night but that day had occurred in the morning as well  LKW: 4/28 morning Thrombolytic given?: No, out of the window IA performed?: No, exam not consistent with LVO Premorbid modified rankin scale:      3 - Moderate disability. Requires some help, but able to walk unassisted.   ROS: Unable to obtain due to altered mental status.   Past Medical History:  Diagnosis Date   Acute cystitis without hematuria 02/03/2022   Acute exacerbation of CHF (congestive heart failure) (HCC) 12/18/2020   Acute on chronic combined systolic and diastolic CHF (congestive heart failure) (HCC)    Acute on chronic systolic (congestive) heart failure (HCC) 09/07/2022   AKI (acute kidney injury) (HCC)    Aortic stenosis    s/p TAVR 26 mm Edwards Sapien 3 THV July 2020   Atrial fibrillation with rapid ventricular response (HCC) 09/02/2021   CAD (coronary artery disease)    COPD (chronic obstructive pulmonary disease) (HCC)    COVID-19 virus infection 06/07/2021   Diabetes mellitus (HCC)    Diverticulitis    Elevated troponin 09/02/2021   GERD (gastroesophageal reflux disease)    GI bleed 04/13/2021   HTN (hypertension)    Hyperlipidemia    Lab test positive for detection of COVID-19 virus    Legally blind in left eye, as defined in [place]    Tobacco abuse    UTI (urinary tract infection) 11/10/2021   Past Surgical History:  Procedure Laterality Date  ABDOMINAL HYSTERECTOMY     APPENDECTOMY     BACK SURGERY     CATARACT EXTRACTION, BILATERAL     CHOLECYSTECTOMY     COLONOSCOPY WITH PROPOFOL N/A 04/16/2021   Procedure: COLONOSCOPY WITH PROPOFOL;  Surgeon: Rachael Fee, MD;  Location: [facility];  Service: Gastroenterology;  Laterality: N/A;   ESOPHAGOGASTRODUODENOSCOPY (EGD) WITH PROPOFOL N/A 04/15/2021   Procedure: ESOPHAGOGASTRODUODENOSCOPY (EGD) WITH  PROPOFOL;  Surgeon: Sherrilyn Rist, MD;  Location: [facility];  Service: Gastroenterology;  Laterality: N/A;   Current Outpatient Medications  Medication Instructions   acetaminophen (TYLENOL) 500 mg, Oral, 3 times daily PRN, Take with Tramadol   atorvastatin (LIPITOR) 40 MG tablet TAKE 1 TABLET BY MOUTH DAILY AT 6 PM.   cholecalciferol (VITAMIN D3) 1,000 Units, Oral, Daily   Co Q-10 300 mg, Oral, Every morning   cyanocobalamin (VITAMIN B12) 1,000 mcg, Oral, Daily   donepezil (ARICEPT) 5 mg, Oral, Daily at bedtime   Eliquis 5 mg, Oral, 2 times daily   ferrous gluconate (FERGON) 324 mg, Oral, Daily with breakfast   fluticasone (FLONASE) 50 MCG/ACT nasal spray 2 sprays, Each Nare, Daily PRN   Lidocaine-Menthol, Spray, 4-1 % LIQD 1 spray, Topical, Daily PRN   Magnesium 200 mg, Oral, Every other day   metoprolol succinate (TOPROL XL) 50 mg, Oral, 2 times daily, Take with or immediately following a meal.   Misc Natural Products (OSTEO BI-FLEX ADV JOINT SHIELD PO) 1 tablet, Oral, Daily   Multiple Vitamins-Minerals (CENTRUM MINIS WOMEN 50+ PO) 1 tablet, Oral, Daily   Omega-3 Fatty Acids (FISH OIL) 1200 MG CAPS 1 capsule, Oral, Daily   ondansetron (ZOFRAN) 4 mg, Oral, Every 6 hours PRN   potassium chloride 20 MEQ/15ML (10%) SOLN 20 mEq, Oral, Daily   psyllium (METAMUCIL) 58.6 % powder 1 packet, Oral, Daily PRN   QUEtiapine (SEROQUEL) 12.5 mg, Oral, Daily at bedtime   sodium chloride (OCEAN) 0.65 % SOLN nasal spray 1 spray, Each Nare, As needed   spironolactone (ALDACTONE) 12.5 mg, Oral, Daily   terazosin (HYTRIN) 10 mg, Oral, Daily at bedtime   torsemide (DEMADEX) 40 mg, Oral, Daily, Incase of weight gain 2 to 3 lbs in 24 hrs or 5 lbs in 7 days, take 2 tablets twice a day until weight back to baseline.   traMADol (ULTRAM) 50 mg, Oral, 3 times daily   traZODone (DESYREL) 25 mg, Oral, At bedtime PRN     Family History  Problem Relation Age of Onset   Diabetes Mother    Stroke Brother     Cerebral palsy Brother    Coronary artery disease Neg Hx    Social History:  reports that she quit smoking about 2 years ago. Her smoking use included cigarettes. She has a 70.00 pack-year smoking history. She has never used smokeless tobacco. She reports that she does not drink alcohol and does not use drugs.   Exam: Current vital signs: BP 124/66   Pulse 60   Temp 98.7 F (37.1 C) (Oral)   Resp 20   Ht 5' (1.524 m)   Wt 62.7 kg   SpO2 93%   BMI 27.00 kg/m  Vital signs in last 24 hours: Temp:  [97.8 F (36.6 C)-98.7 F (37.1 C)] 98.7 F (37.1 C) (04/29 0400) Pulse Rate:  [60-77] 60 (04/29 0430) Resp:  [0-20] 20 (04/29 0400) BP: (117-135)/(60-82) 124/66 (04/29 0430) SpO2:  [91 %-98 %] 93 % (04/29 0430) Weight:  [62.7 kg] 62.7 kg (04/28 2031)  Exam highly limited by patient being very somnolent after receiving sedating medications for MRI  Physical Exam  Constitutional: Appears elderly  Psych: Irritable on waking, fairly reluctant to participate in any examination, repeatedly telling me to let her sleep Eyes: No scleral injection HENT: No oropharyngeal obstruction.  MSK: no major joint deformities.  Cardiovascular: Perfusing extremities well Respiratory: Effort normal, non-labored breathing GI: Soft.  No distension.  Skin: Warm dry and intact visible skin  Neuro: Mental Status: Patient is somnolent.  She briefly follows some commands intermittently.  She is able to name pinky and thumb but otherwise does not participate in language testing.  She reports that it is June or July and she is 89 years old.  When asked where she is she initially responds [place], when asked the name of the building she states it is some sort of hospital. Cranial Nerves: II: Pupils are round, and reactive to light, difficult to assess if equal due to eye closure.   III,IV, VI: Orients to examiner bilaterally does seem to orient more briskly to the right than the left V: Facial  sensation is symmetric to light eyelash brush VII: Facial movement is symmetric on grimace.  VIII: hearing is intact to voice Remainder unable to assess due to mental status Motor/sensory: Right upper extremity is at least antigravity but quickly drifts to the bed as she does not want to hold it up because she is sleepy, despite multiple attempts to test her.  Left upper extremity has at least trace movement.  She will not maintain either of her lower extremities antigravity but withdraws briskly to tickle.  No clear sensory deficit Plantars reflexes: Toes are downgoing bilaterally.  Cerebellar: Unable to assess secondary to patient's mental status Gait:  Unable to assess secondary to patient's mental status   NIHSS total 16 Score breakdown: 2 points for sleepiness, 1 point for not answering month correctly, 1 point for only intermittently following some commands, 3 points for left arm weakness, 2 points for right arm weakness, 3 points for left leg weakness, 3 points for right leg weakness, 1 point for dysarthria; score highly limited by her somnolence Performed at 6:30 AM   I have reviewed labs in epic and the results pertinent to this consultation are:  Basic Metabolic Panel: Recent Labs  Lab 12/11/22 1322 12/11/22 1344 12/12/22 2215  NA 138 139 137  K 4.4 4.2 3.7  CL 101 104 100  CO2 24  --  22  GLUCOSE 142* 138* 114*  BUN 39* 35* 36*  CREATININE 1.29* 1.30* 1.25*  CALCIUM 9.5  --  9.0    CBC: Recent Labs  Lab 12/11/22 1322 12/11/22 1344 12/12/22 2215  WBC 7.3  --  6.1  NEUTROABS  --   --  4.6  HGB 12.2 11.9* 11.1*  HCT 36.5 35.0* 33.9*  MCV 94.1  --  94.2  PLT 158  --  172    Coagulation Studies: Recent Labs    12/11/22 1322  LABPROT 19.4*  INR 1.7*    Lab Results  Component Value Date   CHOL 94 05/08/2021   HDL 34 (L) 05/08/2021   LDLCALC 49 05/08/2021   TRIG 57 05/08/2021   CHOLHDL 2.8 05/08/2021   Lab Results  Component Value Date   HGBA1C 6.7  (H) 09/08/2022      I have reviewed the images obtained:  Head CT 12/12/2022 personally reviewed, agree with radiology:   1. No acute intracranial abnormality. 2. Small left parietal scalp  contusion. No calvarial fracture. 3. Age-related cerebral volume loss with chronic small vessel ischemic disease.  Head CT and C-spine CT 12/11/2022 1. No acute intracranial pathology. Small-vessel white matter disease. 2. Soft tissue contusion of the left scalp vertex. 3. No fracture or static subluxation of the cervical spine. 4. Severe multilevel disc and facet degenerative disease of the cervical spine, worst from C5-C7.  MRI brain 12/13/2022 personally reviewed, agree with radiology:   Very truncated and motion degraded study. Small acute infarct is still suspected at the high right frontal parietal junction.   Impression: Left parietal stroke likely cardioembolic from known atrial fibrillation and (based on cognitive concerns documented in chart review with specific reference to medication adherence) likely incomplete medication adherence (though INR is elevated today suggesting she has taken her Eliquis recently).  Given how motion limited her MRI brain was, hard to estimate accurately the size of her stroke, however would hold Eliquis at least for this morning.  If repeat imaging this afternoon/evening is reassuring, could potentially resume Eliquis pending stroke team evaluation  Of note, I have discontinued order for MRI cervical spine given her poor ability to tolerate MRI, as well as limited MRI brain supportive of acute stroke explaining her left upper extremity symptoms.  At this time I do not think that the risks of sedation for MRI cervical spine would outweigh the benefits from completing the study especially given that generalized anesthesia is at times associated with significant irreversible decline in patients with baseline mild cognitive impairment  Recommendations:  # Likely left  parietal stroke - Stroke labs HgbA1c, fasting lipid panel - Due to difficulty tolerating MRI brain, consider repeat head CT this evening to confirm safety of restarting Eliquis - Adjust home atorvastatin 40 mg daily if LDL greater than goal of 70 - Carotid duplex given inability to tolerate MRI and CKD contraindicating iodinated contrast - Frequent neuro checks per stroke protocol - Echocardiogram not needed from a neurological perspective given she has a long-term indication for anticoagulation, defer to primary team if needed from a medical perspective - Antiplatelet med: Aspirin - dose 325mg  PO or 300mg  PR, until Eliquis can be resumed - Risk factor modification, diet, exercise, medication adherence counseling, likely needs close supervision of medication use with direct observation of medication intake - Telemetry monitoring;  - Blood pressure goal   - Permissive hypertension to 220/120 due to acute stroke for 24 to 48 hours - PT consult, OT consult, Speech consult - Routine EEG  - Stroke team to follow  Brooke Dare MD-PhD [facility] [phone] Available 7 PM to 7 AM, outside of these hours please call Neurologist on call as listed on Amion.

## 2022-12-13 NOTE — ED Notes (Signed)
ED TO INPATIENT HANDOFF REPORT  ED Nurse Name and Phone #:  Marcello Moores [phone]  S Name/Age/Gender Tara Fields 89 y.o. female Room/Bed: 040C/040C  Code Status   Code Status: DNR  Home/SNF/Other Skilled nursing facility Patient oriented to: self, place, time, and situation Is this baseline? Yes   Triage Complete: Triage complete  Chief Complaint Acute CVA (cerebrovascular accident) [facility]) [I63.9]  Triage Note No notes on file   Allergies Allergies  Allergen Reactions   Amlodipine Besy-Benazepril Hcl Cough   Hctz [Hydrochlorothiazide] Other (See Comments)    Per family, every diuretic the patient has been on flushes out her out and ultimately results in CRAMPING/JAUNDICE (added potassium might help)   Other Other (See Comments)    History of diverticulitis- CANNOT TOLERATE NUTS, CORN, AND ANY FOODS NOT EASILY DIGESTED- the patient AVOIDS these   Cyclobenzaprine Other (See Comments)    Confusion- does not tolerate well    Diclofenac Sodium Diarrhea   Hydralazine Other (See Comments)    Per family, every diuretic the patient has been on flushes out her out and ultimately results in CRAMPING/JAUNDICE (added potassium might help)   Methylprednisolone Other (See Comments)    Medrol/CORTICOSTEROIDS = A LOT OF ENERGY Delusions    Oxycodone Other (See Comments)    Patient becomes very disorientated     Level of Care/Admitting Diagnosis ED Disposition     ED Disposition  Admit   Condition  --   Comment  Hospital Area: [facility] [[id number]]  Level of Care: Telemetry Medical [[id number]]  May admit patient to [facility] or [facility] if equivalent level of care is available:: No  Covid Evaluation: Asymptomatic - no recent exposure (last 10 days) testing not required  Diagnosis: Acute CVA (cerebrovascular accident) [facility]) [[id number]]  Admitting Physician: Jonah Blue [[id number]]  Attending Physician: Jonah Blue [[id number]]  Certification:: I certify this patient  will need inpatient services for at least 2 midnights  Estimated Length of Stay: 3          B Medical/Surgery History Past Medical History:  Diagnosis Date   Acute cystitis without hematuria 02/03/2022   Acute on chronic combined systolic and diastolic CHF (congestive heart failure) (HCC)    Acute on chronic systolic (congestive) heart failure (HCC) 09/07/2022   Aortic stenosis    s/p TAVR 26 mm Edwards Sapien 3 THV July 2020   Atrial fibrillation with rapid ventricular response (HCC) 09/02/2021   CAD (coronary artery disease)    COPD (chronic obstructive pulmonary disease) (HCC)    COVID-19 virus infection 06/07/2021   Diabetes mellitus (HCC)    Diverticulitis    Elevated troponin 09/02/2021   GERD (gastroesophageal reflux disease)    GI bleed 04/13/2021   HTN (hypertension)    Hyperlipidemia    Lab test positive for detection of COVID-19 virus    Legally blind in left eye, as defined in [place]    Tobacco abuse    UTI (urinary tract infection) 11/10/2021   Past Surgical History:  Procedure Laterality Date   ABDOMINAL HYSTERECTOMY     APPENDECTOMY     BACK SURGERY     CATARACT EXTRACTION, BILATERAL     CHOLECYSTECTOMY     COLONOSCOPY WITH PROPOFOL N/A 04/16/2021   Procedure: COLONOSCOPY WITH PROPOFOL;  Surgeon: Rachael Fee, MD;  Location: [facility];  Service: Gastroenterology;  Laterality: N/A;   ESOPHAGOGASTRODUODENOSCOPY (EGD) WITH PROPOFOL N/A 04/15/2021   Procedure: ESOPHAGOGASTRODUODENOSCOPY (EGD) WITH PROPOFOL;  Surgeon: Sherrilyn Rist, MD;  Location: [facility];  Service: Gastroenterology;  Laterality: N/A;     A IV Location/Drains/Wounds Patient Lines/Drains/Airways Status     Active Line/Drains/Airways     Name Placement date Placement time Site Days   Peripheral IV 12/13/22 18 G Left Antecubital 12/13/22  0125  Antecubital  less than 1            Intake/Output Last 24 hours No intake or output data in the 24 hours ending 12/13/22  1240  Labs/Imaging Results for orders placed or performed during the hospital encounter of 12/12/22 (from the past 48 hour(s))  Troponin I (High Sensitivity)     Status: Abnormal   Collection Time: 12/12/22 10:15 PM  Result Value Ref Range   Troponin I (High Sensitivity) 19 (H) <18 ng/L    Comment: (NOTE) Elevated high sensitivity troponin I (hsTnI) values and significant  changes across serial measurements may suggest ACS but many other  chronic and acute conditions are known to elevate hsTnI results.  Refer to the "Links" section for chest pain algorithms and additional  guidance. Performed at [facility], [address] [place], [place] [address]   CBC with Differential/Platelet     Status: Abnormal   Collection Time: 12/12/22 10:15 PM  Result Value Ref Range   WBC 6.1 4.0 - 10.5 K/uL   RBC 3.60 (L) 3.87 - 5.11 MIL/uL   Hemoglobin 11.1 (L) 12.0 - 15.0 g/dL   HCT 84.1 (L) 32.4 - 40.1 %   MCV 94.2 80.0 - 100.0 fL   MCH 30.8 26.0 - 34.0 pg   MCHC 32.7 30.0 - 36.0 g/dL   RDW 02.7 25.3 - 66.4 %   Platelets 172 150 - 400 K/uL    Comment: REPEATED TO VERIFY   nRBC 0.0 0.0 - 0.2 %   Neutrophils Relative % 77 %   Neutro Abs 4.6 1.7 - 7.7 K/uL   Lymphocytes Relative 12 %   Lymphs Abs 0.7 0.7 - 4.0 K/uL   Monocytes Relative 9 %   Monocytes Absolute 0.6 0.1 - 1.0 K/uL   Eosinophils Relative 2 %   Eosinophils Absolute 0.1 0.0 - 0.5 K/uL   Basophils Relative 0 %   Basophils Absolute 0.0 0.0 - 0.1 K/uL   Immature Granulocytes 0 %   Abs Immature Granulocytes 0.02 0.00 - 0.07 K/uL    Comment: Performed at [facility], [address], [place], [place] [address]  Basic metabolic panel     Status: Abnormal   Collection Time: 12/12/22 10:15 PM  Result Value Ref Range   Sodium 137 135 - 145 mmol/L   Potassium 3.7 3.5 - 5.1 mmol/L    Comment: HEMOLYSIS AT THIS LEVEL MAY AFFECT RESULT   Chloride 100 98 - 111 mmol/L   CO2 22 22 - 32 mmol/L   Glucose, Bld 114 (H) 70 - 99  mg/dL    Comment: Glucose reference range applies only to samples taken after fasting for at least 8 hours.   BUN 36 (H) 8 - 23 mg/dL   Creatinine, Ser 4.25 (H) 0.44 - 1.00 mg/dL   Calcium 9.0 8.9 - 95.6 mg/dL   GFR, Estimated 40 (L) >60 mL/min    Comment: (NOTE) Calculated using the CKD-EPI Creatinine Equation (2021)    Anion gap 15 5 - 15    Comment: Performed at [facility], [address] [place], [place] [address]   EEG adult  Result Date: 12/13/2022 Charlsie Quest, MD  12/13/2022 11:15 AM Patient Name: Tara Fields MRN: [id number] Epilepsy Attending: Charlsie Quest Referring Physician/Provider: Gordy Councilman, MD Date: 12/13/2022 Duration: 23.34 mins Patient history: 89 year old female with left frontal stroke getting EEG to evaluate for seizure. Level of alertness: Awake AEDs during EEG study: None Technical aspects: This EEG study was done with scalp electrodes positioned according to the 10-20 International system of electrode placement. Electrical activity was reviewed with band pass filter of 1-70Hz , sensitivity of 7 uV/mm, display speed of 25mm/sec with a 60Hz  notched filter applied as appropriate. EEG data were recorded continuously and digitally stored.  Video monitoring was available and reviewed as appropriate. Description: The posterior dominant rhythm consists of 7.5 Hz activity of moderate voltage (25-35 uV) seen predominantly in posterior head regions, symmetric and reactive to eye opening and eye closing. EEG showed intermittent generalized 3 to 6 Hz theta-delta slowing. Hyperventilation and photic stimulation were not performed.   ABNORMALITY - Intermittent slow, generalized IMPRESSION: This study is suggestive of mild diffuse encephalopathy. No seizures or epileptiform discharges were seen throughout the recording. Charlsie Quest   MR BRAIN WO CONTRAST  Result Date: 12/13/2022 CLINICAL DATA:  Stroke workup EXAM: MRI HEAD WITHOUT CONTRAST TECHNIQUE:  Multiplanar, multiecho pulse sequences of the brain and surrounding structures were obtained without intravenous contrast. COMPARISON:  Head CT from yesterday FINDINGS: Only diffusion imaging was acquired, and very much motion degraded, due to patient condition despite sedative medication. Especially on coronal acquisition small areas of cortex and white matter infarct or suspected at high right frontal lobe. No hydrocephalus or gross midline shift. IMPRESSION: Very truncated and motion degraded study. Small acute infarct is still suspected at the high right frontal parietal junction. Electronically Signed   By: Tiburcio Pea M.D.   On: 12/13/2022 05:10   CT Head Wo Contrast  Result Date: 12/12/2022 CLINICAL DATA:  Initial evaluation for neuro deficit, stroke suspected. EXAM: CT HEAD WITHOUT CONTRAST TECHNIQUE: Contiguous axial images were obtained from the base of the skull through the vertex without intravenous contrast. RADIATION DOSE REDUCTION: This exam was performed according to the departmental dose-optimization program which includes automated exposure control, adjustment of the mA and/or kV according to patient size and/or use of iterative reconstruction technique. COMPARISON:  Prior study from 12/11/2022. FINDINGS: Brain: Age-related cerebral volume loss with chronic small vessel ischemic disease. No acute intracranial hemorrhage. No acute large vessel territory infarct. No mass lesion or midline shift. No hydrocephalus or extra-axial fluid collection. Prominent dural base calcifications noted about the falx. Vascular: No abnormal hyperdense vessel. Calcified atherosclerosis present at the skull base. Skull: Small left parietal scalp contusion. Calvarium intact. Hyperostosis frontalis interna noted. Sinuses/Orbits: Globes orbital soft tissues within normal limits. Small volume pneumatized secretions noted within the left sphenoid sinus. Paranasal sinuses are otherwise clear. No mastoid effusion.  Other: None. IMPRESSION: 1. No acute intracranial abnormality. 2. Small left parietal scalp contusion. No calvarial fracture. 3. Age-related cerebral volume loss with chronic small vessel ischemic disease. Electronically Signed   By: Rise Mu M.D.   On: 12/12/2022 22:46   DG Chest Port 1 View  Result Date: 12/12/2022 CLINICAL DATA:  Left arm weakness.  History of lung cancer. EXAM: PORTABLE CHEST 1 VIEW COMPARISON:  Chest x-ray 12/11/2022.  CT chest 12/11/2022. FINDINGS: Heart is enlarged. Patient is status post TAVR. There are patchy airspace and central interstitial opacities bilaterally. There is a band of opacity in the left upper lobe appears unchanged from most recent CT. No pleural effusion or  pneumothorax. No acute fractures are seen. IMPRESSION: 1. Cardiomegaly with patchy airspace and central interstitial opacities bilaterally, likely pulmonary edema. 2. Band of opacity in the left upper lobe appears unchanged from most recent CT. Electronically Signed   By: Darliss Cheney M.D.   On: 12/12/2022 21:48   DG Shoulder Left  Result Date: 12/12/2022 CLINICAL DATA:  Weakness EXAM: LEFT SHOULDER - 2+ VIEW COMPARISON:  None Available. FINDINGS: Degenerative changes of the acromioclavicular joint and glenohumeral joint are seen. No acute fracture or dislocation is noted. Linear density is noted similar to that seen on recent chest CT consistent with post radiation change. No underlying bony abnormality is noted. IMPRESSION: Degenerative change without acute abnormality. Electronically Signed   By: Alcide Clever M.D.   On: 12/12/2022 21:47   CT T-SPINE NO CHARGE  Result Date: 12/11/2022 CLINICAL DATA:  Trauma, history of lung cancer, * Tracking Code: BO * EXAM: CT CHEST, ABDOMEN AND PELVIS WITH CONTRAST CT THORACIC AND LUMBAR SPINE WITH CONTRAST TECHNIQUE: Multidetector CT imaging of the chest, abdomen and pelvis was performed following the standard protocol during bolus administration of  intravenous contrast. Multidetector CT imaging of the thoracic and lumbar spine was performed following the standard protocol during bolus administration of intravenous contrast. RADIATION DOSE REDUCTION: This exam was performed according to the departmental dose-optimization program which includes automated exposure control, adjustment of the mA and/or kV according to patient size and/or use of iterative reconstruction technique. COMPARISON:  CT chest abdomen pelvis, 04/23/2022 FINDINGS: CT CHEST FINDINGS Cardiovascular: Aortic atherosclerosis. Aortic valve stent endograft. Cardiomegaly. Enlargement of the main pulmonary artery measuring up to 3.6 cm in caliber. Three-vessel coronary artery calcifications. No pericardial effusion. Mediastinum/Nodes: No enlarged mediastinal, hilar, or axillary lymph nodes. Thyroid gland, trachea, and esophagus demonstrate no significant findings. Lungs/Pleura: Moderate centrilobular emphysema. Unchanged post treatment/post radiation appearance of the suprahilar left lung (series 6, image 40). Small bilateral pleural effusions and diffuse interlobular septal thickening throughout. Musculoskeletal: No chest wall mass or suspicious osseous lesions identified. CT ABDOMEN PELVIS FINDINGS Hepatobiliary: No focal liver abnormality is seen. Status post cholecystectomy. Unchanged postoperative biliary ductal dilatation. Pancreas: Unremarkable. No pancreatic ductal dilatation or surrounding inflammatory changes. Spleen: Normal in size without significant abnormality. Adrenals/Urinary Tract: Unchanged, definitively benign left adrenal adenoma, for which no further follow-up or characterization is required (series 4, image 63). Kidneys are normal, without renal calculi, solid lesion, or hydronephrosis. Bladder is unremarkable. Stomach/Bowel: Stomach is within normal limits. Appendix is not clearly visualized. No evidence of bowel wall thickening, distention, or inflammatory changes. Descending  and sigmoid diverticulosis. Vascular/Lymphatic: Aortic atherosclerosis. No enlarged abdominal or pelvic lymph nodes. Reproductive: No mass or other abnormality. Other: Small broad-based midline ventral hernia (series 4, image 80). No ascites. Musculoskeletal: No acute osseous findings. CT THORACIC AND LUMBAR SPINE FINDINGS Alignment: Normal thoracic kyphosis. Degenerative and postoperative straightening of the normal lumbar lordosis. Vertebral bodies: Osteopenia.  No fracture or dislocation. Disc spaces: Status post posterior fusion of L4 through S1. Moderate disc space height loss and osteophytosis throughout the thoracic and lumbar spine, worst at L1 through L4. Paraspinous soft tissues: Unremarkable. IMPRESSION: 1. No CT evidence of acute traumatic injury to the chest, abdomen, or pelvis. 2. No fracture or dislocation of the thoracic or lumbar spine. 3. Unchanged post treatment/post radiation appearance of the suprahilar left lung. No evidence of lymphadenopathy or metastatic disease in the chest, abdomen, or pelvis. 4. Small bilateral pleural effusions and diffuse interlobular septal thickening throughout the lungs, consistent with pulmonary edema.  5. Cardiomegaly and coronary artery disease. 6. Enlargement of the main pulmonary artery, as can be seen in pulmonary hypertension. 7. Emphysema. Aortic Atherosclerosis (ICD10-I70.0) and Emphysema (ICD10-J43.9). Electronically Signed   By: Jearld Lesch M.D.   On: 12/11/2022 16:56   CT L-SPINE NO CHARGE  Result Date: 12/11/2022 CLINICAL DATA:  Trauma, history of lung cancer, * Tracking Code: BO * EXAM: CT CHEST, ABDOMEN AND PELVIS WITH CONTRAST CT THORACIC AND LUMBAR SPINE WITH CONTRAST TECHNIQUE: Multidetector CT imaging of the chest, abdomen and pelvis was performed following the standard protocol during bolus administration of intravenous contrast. Multidetector CT imaging of the thoracic and lumbar spine was performed following the standard protocol during  bolus administration of intravenous contrast. RADIATION DOSE REDUCTION: This exam was performed according to the departmental dose-optimization program which includes automated exposure control, adjustment of the mA and/or kV according to patient size and/or use of iterative reconstruction technique. COMPARISON:  CT chest abdomen pelvis, 04/23/2022 FINDINGS: CT CHEST FINDINGS Cardiovascular: Aortic atherosclerosis. Aortic valve stent endograft. Cardiomegaly. Enlargement of the main pulmonary artery measuring up to 3.6 cm in caliber. Three-vessel coronary artery calcifications. No pericardial effusion. Mediastinum/Nodes: No enlarged mediastinal, hilar, or axillary lymph nodes. Thyroid gland, trachea, and esophagus demonstrate no significant findings. Lungs/Pleura: Moderate centrilobular emphysema. Unchanged post treatment/post radiation appearance of the suprahilar left lung (series 6, image 40). Small bilateral pleural effusions and diffuse interlobular septal thickening throughout. Musculoskeletal: No chest wall mass or suspicious osseous lesions identified. CT ABDOMEN PELVIS FINDINGS Hepatobiliary: No focal liver abnormality is seen. Status post cholecystectomy. Unchanged postoperative biliary ductal dilatation. Pancreas: Unremarkable. No pancreatic ductal dilatation or surrounding inflammatory changes. Spleen: Normal in size without significant abnormality. Adrenals/Urinary Tract: Unchanged, definitively benign left adrenal adenoma, for which no further follow-up or characterization is required (series 4, image 63). Kidneys are normal, without renal calculi, solid lesion, or hydronephrosis. Bladder is unremarkable. Stomach/Bowel: Stomach is within normal limits. Appendix is not clearly visualized. No evidence of bowel wall thickening, distention, or inflammatory changes. Descending and sigmoid diverticulosis. Vascular/Lymphatic: Aortic atherosclerosis. No enlarged abdominal or pelvic lymph nodes. Reproductive: No  mass or other abnormality. Other: Small broad-based midline ventral hernia (series 4, image 80). No ascites. Musculoskeletal: No acute osseous findings. CT THORACIC AND LUMBAR SPINE FINDINGS Alignment: Normal thoracic kyphosis. Degenerative and postoperative straightening of the normal lumbar lordosis. Vertebral bodies: Osteopenia.  No fracture or dislocation. Disc spaces: Status post posterior fusion of L4 through S1. Moderate disc space height loss and osteophytosis throughout the thoracic and lumbar spine, worst at L1 through L4. Paraspinous soft tissues: Unremarkable. IMPRESSION: 1. No CT evidence of acute traumatic injury to the chest, abdomen, or pelvis. 2. No fracture or dislocation of the thoracic or lumbar spine. 3. Unchanged post treatment/post radiation appearance of the suprahilar left lung. No evidence of lymphadenopathy or metastatic disease in the chest, abdomen, or pelvis. 4. Small bilateral pleural effusions and diffuse interlobular septal thickening throughout the lungs, consistent with pulmonary edema. 5. Cardiomegaly and coronary artery disease. 6. Enlargement of the main pulmonary artery, as can be seen in pulmonary hypertension. 7. Emphysema. Aortic Atherosclerosis (ICD10-I70.0) and Emphysema (ICD10-J43.9). Electronically Signed   By: Jearld Lesch M.D.   On: 12/11/2022 16:56   CT CHEST ABDOMEN PELVIS W CONTRAST  Result Date: 12/11/2022 CLINICAL DATA:  Trauma, history of lung cancer, * Tracking Code: BO * EXAM: CT CHEST, ABDOMEN AND PELVIS WITH CONTRAST CT THORACIC AND LUMBAR SPINE WITH CONTRAST TECHNIQUE: Multidetector CT imaging of the chest, abdomen and pelvis  was performed following the standard protocol during bolus administration of intravenous contrast. Multidetector CT imaging of the thoracic and lumbar spine was performed following the standard protocol during bolus administration of intravenous contrast. RADIATION DOSE REDUCTION: This exam was performed according to the  departmental dose-optimization program which includes automated exposure control, adjustment of the mA and/or kV according to patient size and/or use of iterative reconstruction technique. COMPARISON:  CT chest abdomen pelvis, 04/23/2022 FINDINGS: CT CHEST FINDINGS Cardiovascular: Aortic atherosclerosis. Aortic valve stent endograft. Cardiomegaly. Enlargement of the main pulmonary artery measuring up to 3.6 cm in caliber. Three-vessel coronary artery calcifications. No pericardial effusion. Mediastinum/Nodes: No enlarged mediastinal, hilar, or axillary lymph nodes. Thyroid gland, trachea, and esophagus demonstrate no significant findings. Lungs/Pleura: Moderate centrilobular emphysema. Unchanged post treatment/post radiation appearance of the suprahilar left lung (series 6, image 40). Small bilateral pleural effusions and diffuse interlobular septal thickening throughout. Musculoskeletal: No chest wall mass or suspicious osseous lesions identified. CT ABDOMEN PELVIS FINDINGS Hepatobiliary: No focal liver abnormality is seen. Status post cholecystectomy. Unchanged postoperative biliary ductal dilatation. Pancreas: Unremarkable. No pancreatic ductal dilatation or surrounding inflammatory changes. Spleen: Normal in size without significant abnormality. Adrenals/Urinary Tract: Unchanged, definitively benign left adrenal adenoma, for which no further follow-up or characterization is required (series 4, image 63). Kidneys are normal, without renal calculi, solid lesion, or hydronephrosis. Bladder is unremarkable. Stomach/Bowel: Stomach is within normal limits. Appendix is not clearly visualized. No evidence of bowel wall thickening, distention, or inflammatory changes. Descending and sigmoid diverticulosis. Vascular/Lymphatic: Aortic atherosclerosis. No enlarged abdominal or pelvic lymph nodes. Reproductive: No mass or other abnormality. Other: Small broad-based midline ventral hernia (series 4, image 80). No ascites.  Musculoskeletal: No acute osseous findings. CT THORACIC AND LUMBAR SPINE FINDINGS Alignment: Normal thoracic kyphosis. Degenerative and postoperative straightening of the normal lumbar lordosis. Vertebral bodies: Osteopenia.  No fracture or dislocation. Disc spaces: Status post posterior fusion of L4 through S1. Moderate disc space height loss and osteophytosis throughout the thoracic and lumbar spine, worst at L1 through L4. Paraspinous soft tissues: Unremarkable. IMPRESSION: 1. No CT evidence of acute traumatic injury to the chest, abdomen, or pelvis. 2. No fracture or dislocation of the thoracic or lumbar spine. 3. Unchanged post treatment/post radiation appearance of the suprahilar left lung. No evidence of lymphadenopathy or metastatic disease in the chest, abdomen, or pelvis. 4. Small bilateral pleural effusions and diffuse interlobular septal thickening throughout the lungs, consistent with pulmonary edema. 5. Cardiomegaly and coronary artery disease. 6. Enlargement of the main pulmonary artery, as can be seen in pulmonary hypertension. 7. Emphysema. Aortic Atherosclerosis (ICD10-I70.0) and Emphysema (ICD10-J43.9). Electronically Signed   By: Jearld Lesch M.D.   On: 12/11/2022 16:56   CT HEAD WO CONTRAST  Result Date: 12/11/2022 CLINICAL DATA:  Trauma EXAM: CT HEAD WITHOUT CONTRAST CT CERVICAL SPINE WITHOUT CONTRAST TECHNIQUE: Multidetector CT imaging of the head and cervical spine was performed following the standard protocol without intravenous contrast. Multiplanar CT image reconstructions of the cervical spine were also generated. RADIATION DOSE REDUCTION: This exam was performed according to the departmental dose-optimization program which includes automated exposure control, adjustment of the mA and/or kV according to patient size and/or use of iterative reconstruction technique. COMPARISON:  11/26/2022 FINDINGS: CT HEAD FINDINGS Brain: No evidence of acute infarction, hemorrhage, hydrocephalus,  extra-axial collection or mass lesion/mass effect. Periventricular white matter hypodensity. Vascular: No hyperdense vessel or unexpected calcification. Skull: Hyperostosis frontalis. Negative for fracture or focal lesion. Sinuses/Orbits: No acute finding. Other: Soft tissue contusion of the  left scalp vertex (series 3, image 27). CT CERVICAL SPINE FINDINGS Alignment: Normal. Skull base and vertebrae: No acute fracture. No primary bone lesion or focal pathologic process. Soft tissues and spinal canal: No prevertebral fluid or swelling. No visible canal hematoma. Disc levels: Severe multilevel disc and facet degenerative disease and osteophytosis of the cervical spine, worst from C5-C7. Upper chest: Negative. Other: None. IMPRESSION: 1. No acute intracranial pathology. Small-vessel white matter disease. 2. Soft tissue contusion of the left scalp vertex. 3. No fracture or static subluxation of the cervical spine. 4. Severe multilevel disc and facet degenerative disease of the cervical spine, worst from C5-C7. Electronically Signed   By: Jearld Lesch M.D.   On: 12/11/2022 16:25   CT CERVICAL SPINE WO CONTRAST  Result Date: 12/11/2022 CLINICAL DATA:  Trauma EXAM: CT HEAD WITHOUT CONTRAST CT CERVICAL SPINE WITHOUT CONTRAST TECHNIQUE: Multidetector CT imaging of the head and cervical spine was performed following the standard protocol without intravenous contrast. Multiplanar CT image reconstructions of the cervical spine were also generated. RADIATION DOSE REDUCTION: This exam was performed according to the departmental dose-optimization program which includes automated exposure control, adjustment of the mA and/or kV according to patient size and/or use of iterative reconstruction technique. COMPARISON:  11/26/2022 FINDINGS: CT HEAD FINDINGS Brain: No evidence of acute infarction, hemorrhage, hydrocephalus, extra-axial collection or mass lesion/mass effect. Periventricular white matter hypodensity. Vascular: No  hyperdense vessel or unexpected calcification. Skull: Hyperostosis frontalis. Negative for fracture or focal lesion. Sinuses/Orbits: No acute finding. Other: Soft tissue contusion of the left scalp vertex (series 3, image 27). CT CERVICAL SPINE FINDINGS Alignment: Normal. Skull base and vertebrae: No acute fracture. No primary bone lesion or focal pathologic process. Soft tissues and spinal canal: No prevertebral fluid or swelling. No visible canal hematoma. Disc levels: Severe multilevel disc and facet degenerative disease and osteophytosis of the cervical spine, worst from C5-C7. Upper chest: Negative. Other: None. IMPRESSION: 1. No acute intracranial pathology. Small-vessel white matter disease. 2. Soft tissue contusion of the left scalp vertex. 3. No fracture or static subluxation of the cervical spine. 4. Severe multilevel disc and facet degenerative disease of the cervical spine, worst from C5-C7. Electronically Signed   By: Jearld Lesch M.D.   On: 12/11/2022 16:25   DG Pelvis Portable  Result Date: 12/11/2022 CLINICAL DATA:  Trauma. EXAM: PORTABLE PELVIS 1-2 VIEWS COMPARISON:  None Available. FINDINGS: There is no evidence of pelvic fracture or diastasis. No pelvic bone lesions are seen. Severe osteoarthritic changes of the right hip, moderate osteoarthritic changes of the left hip. Vascular calcifications noted. Lower lumbosacral spine fusion. IMPRESSION: 1. No acute fracture or diastasis of the pelvis. 2. Severe osteoarthritic changes of the right hip, moderate osteoarthritic changes of the left hip. Electronically Signed   By: Ted Mcalpine M.D.   On: 12/11/2022 13:28   DG Chest Port 1 View  Result Date: 12/11/2022 CLINICAL DATA:  Fall. EXAM: PORTABLE CHEST 1 VIEW COMPARISON:  11/26/2022 FINDINGS: The cardio pericardial silhouette is enlarged. Status post TAVR. Interstitial markings are diffusely coarsened with chronic features. Streaky left parahilar density is likely atelectasis and mildly  progressive in the interval. Bones are diffusely demineralized. IMPRESSION: Streaky left parahilar density is likely atelectasis and mildly progressive in the interval. No evidence for pneumothorax or pleural effusion. Electronically Signed   By: Kennith Center M.D.   On: 12/11/2022 13:27    Pending Labs [employer] (From admission, onward)     Start     Ordered  12/13/22 1219  Brain natriuretic peptide  Once,   R        12/13/22 1219   12/13/22 0654  Lipid panel  (Labs)  Add-on,   AD       Comments: Fasting    12/13/22 0654   12/13/22 0654  Hemoglobin A1c  (Labs)  Add-on,   AD       Comments: To assess prior glycemic control    12/13/22 0654            Vitals/Pain Today's Vitals   12/13/22 0900 12/13/22 1000 12/13/22 1139 12/13/22 1215  BP: (!) 156/108   (!) 179/111  Pulse: (!) 59 65  65  Resp: 17 14  15   Temp:   98.1 F (36.7 C)   TempSrc:   Oral   SpO2: 92% 97%  96%  Weight:      Height:      PainSc:        Isolation Precautions No active isolations  Medications Medications   stroke: early stages of recovery book (has no administration in time range)  aspirin suppository 300 mg (300 mg Rectal Given 12/13/22 0924)    Or  aspirin tablet 325 mg ( Oral See Alternative 12/13/22 0924)  traMADol (ULTRAM) tablet 50 mg (50 mg Oral Given 12/13/22 1137)  atorvastatin (LIPITOR) tablet 40 mg (40 mg Oral Given 12/13/22 1137)  QUEtiapine (SEROQUEL) tablet 12.5 mg (has no administration in time range)  traZODone (DESYREL) tablet 25 mg (has no administration in time range)  senna-docusate (Senokot-S) tablet 1 tablet (has no administration in time range)  acetaminophen (TYLENOL) tablet 650 mg (has no administration in time range)    Or  acetaminophen (TYLENOL) 160 MG/5ML solution 650 mg (has no administration in time range)    Or  acetaminophen (TYLENOL) suppository 650 mg (has no administration in time range)  ondansetron (ZOFRAN) injection 4 mg (has no administration in  time range)  LORazepam (ATIVAN) injection 1 mg (has no administration in time range)  LORazepam (ATIVAN) injection 0.5 mg (has no administration in time range)  nitroGLYCERIN (NITROSTAT) SL tablet 0.4 mg (0.4 mg Sublingual Given 12/13/22 1233)  diazepam (VALIUM) injection 2 mg (2 mg Intravenous Given 12/13/22 0434)  ondansetron (ZOFRAN) injection 4 mg (4 mg Intravenous Given 12/13/22 0407)    Mobility walks with device     Focused Assessments Neuro Assessment Handoff:  Swallow screen pass? Yes    NIH Stroke Scale  Dizziness Present: No Headache Present: No Interval: Shift assessment Level of Consciousness (1a.)   : Not alert, but arousable by minor stimulation to obey, answer, or respond LOC Questions (1b. )   : Answers both questions correctly LOC Commands (1c. )   : Performs both tasks correctly Best Gaze (2. )  : Normal Visual (3. )  : No visual loss Facial Palsy (4. )    : Normal symmetrical movements Motor Arm, Left (5a. )   : No drift Last date known well: 12/11/22 Last time known well: 0800 Neuro Assessment: Within Defined Limits Neuro Checks:   Initial (12/12/22 2100)  Has TPA been given? No If patient is a Neuro Trauma and patient is going to OR before floor call report to 4N Charge nurse: [phone] or [phone]   R Recommendations: See Admitting Provider Note  Report given to:   Additional Notes:

## 2022-12-13 NOTE — ED Notes (Signed)
EEG at bedside.

## 2022-12-13 NOTE — ED Provider Notes (Signed)
I assumed care of this patient.  Please see previous provider's note for further details of history, exam, and MDM.   Briefly patient is a 89 y.o. female who presented with lethargy and left arm weakness. Pending MRI to assess for stroke.  MRI with small right parietal stroke. Admitted to medicine Neuro to see       Nira Conn, MD 12/13/22 1816

## 2022-12-13 NOTE — Procedures (Signed)
Patient Name: Emorie Mcfate  MRN: [id number]  Epilepsy Attending: Charlsie Quest  Referring Physician/Provider: Gordy Councilman, MD  Date: 12/13/2022 Duration: 23.34 mins  Patient history: 89 year old female with left frontal stroke getting EEG to evaluate for seizure.  Level of alertness: Awake AEDs during EEG study: None  Technical aspects: This EEG study was done with scalp electrodes positioned according to the 10-20 International system of electrode placement. Electrical activity was reviewed with band pass filter of 1-70Hz , sensitivity of 7 uV/mm, display speed of 62mm/sec with a 60Hz  notched filter applied as appropriate. EEG data were recorded continuously and digitally stored.  Video monitoring was available and reviewed as appropriate.  Description: The posterior dominant rhythm consists of 7.5 Hz activity of moderate voltage (25-35 uV) seen predominantly in posterior head regions, symmetric and reactive to eye opening and eye closing. EEG showed intermittent generalized 3 to 6 Hz theta-delta slowing. Hyperventilation and photic stimulation were not performed.     ABNORMALITY - Intermittent slow, generalized  IMPRESSION: This study is suggestive of mild diffuse encephalopathy. No seizures or epileptiform discharges were seen throughout the recording.  Galvin Aversa Annabelle Harman

## 2022-12-14 ENCOUNTER — Inpatient Hospital Stay (HOSPITAL_COMMUNITY): Payer: Medicare Other

## 2022-12-14 DIAGNOSIS — Z515 Encounter for palliative care: Secondary | ICD-10-CM | POA: Diagnosis not present

## 2022-12-14 DIAGNOSIS — Z7189 Other specified counseling: Secondary | ICD-10-CM

## 2022-12-14 DIAGNOSIS — I482 Chronic atrial fibrillation, unspecified: Secondary | ICD-10-CM

## 2022-12-14 DIAGNOSIS — Z66 Do not resuscitate: Secondary | ICD-10-CM | POA: Diagnosis not present

## 2022-12-14 DIAGNOSIS — I4819 Other persistent atrial fibrillation: Secondary | ICD-10-CM

## 2022-12-14 DIAGNOSIS — I5021 Acute systolic (congestive) heart failure: Secondary | ICD-10-CM

## 2022-12-14 DIAGNOSIS — I6521 Occlusion and stenosis of right carotid artery: Secondary | ICD-10-CM

## 2022-12-14 DIAGNOSIS — N1832 Chronic kidney disease, stage 3b: Secondary | ICD-10-CM

## 2022-12-14 DIAGNOSIS — I10 Essential (primary) hypertension: Secondary | ICD-10-CM

## 2022-12-14 DIAGNOSIS — Z789 Other specified health status: Secondary | ICD-10-CM

## 2022-12-14 DIAGNOSIS — I639 Cerebral infarction, unspecified: Secondary | ICD-10-CM | POA: Diagnosis not present

## 2022-12-14 DIAGNOSIS — I255 Ischemic cardiomyopathy: Secondary | ICD-10-CM | POA: Diagnosis not present

## 2022-12-14 DIAGNOSIS — R52 Pain, unspecified: Secondary | ICD-10-CM

## 2022-12-14 MED ORDER — OXYCODONE-ACETAMINOPHEN 5-325 MG PO TABS: 1.0000 | ORAL_TABLET | Freq: Four times a day (QID) | ORAL | Status: DC | PRN

## 2022-12-14 MED ORDER — ADULT MULTIVITAMIN W/MINERALS CH
1.0000 | ORAL_TABLET | Freq: Every day | ORAL | Status: DC
  Administered 2022-12-14 – 2022-12-16 (×3): 1 via ORAL
  Filled 2022-12-14 (×3): qty 1

## 2022-12-14 MED ORDER — MORPHINE SULFATE (PF) 2 MG/ML IV SOLN: 1.0000 mg | INTRAVENOUS | Status: DC | PRN

## 2022-12-14 MED ORDER — FENTANYL CITRATE PF 50 MCG/ML IJ SOSY
25.0000 ug | PREFILLED_SYRINGE | INTRAMUSCULAR | Status: DC | PRN
  Administered 2022-12-14 – 2022-12-15 (×3): 25 ug via INTRAVENOUS
  Filled 2022-12-14 (×4): qty 1

## 2022-12-14 MED ORDER — METOPROLOL SUCCINATE ER 50 MG PO TB24
50.0000 mg | ORAL_TABLET | Freq: Two times a day (BID) | ORAL | Status: DC
  Administered 2022-12-14 – 2022-12-16 (×4): 50 mg via ORAL
  Filled 2022-12-14 (×5): qty 1

## 2022-12-14 MED ORDER — IOHEXOL 350 MG/ML SOLN
75.0000 mL | Freq: Once | INTRAVENOUS | Status: AC | PRN
  Administered 2022-12-14: 75 mL via INTRAVENOUS

## 2022-12-14 MED ORDER — PANTOPRAZOLE SODIUM 40 MG PO TBEC
40.0000 mg | DELAYED_RELEASE_TABLET | Freq: Every day | ORAL | Status: DC
  Administered 2022-12-14 – 2022-12-16 (×3): 40 mg via ORAL
  Filled 2022-12-14 (×3): qty 1

## 2022-12-14 MED ORDER — ENSURE ENLIVE PO LIQD
237.0000 mL | Freq: Two times a day (BID) | ORAL | Status: DC
  Administered 2022-12-14 – 2022-12-16 (×5): 237 mL via ORAL

## 2022-12-14 MED ORDER — ASPIRIN 81 MG PO TBEC
81.0000 mg | DELAYED_RELEASE_TABLET | Freq: Every day | ORAL | Status: DC
  Administered 2022-12-14 – 2022-12-16 (×3): 81 mg via ORAL
  Filled 2022-12-14 (×3): qty 1

## 2022-12-14 MED ORDER — HYDROCODONE-ACETAMINOPHEN 5-325 MG PO TABS
1.0000 | ORAL_TABLET | ORAL | Status: DC | PRN
  Administered 2022-12-15: 1 via ORAL
  Filled 2022-12-14: qty 1

## 2022-12-14 MED ORDER — ACETAMINOPHEN 500 MG PO TABS
500.0000 mg | ORAL_TABLET | Freq: Three times a day (TID) | ORAL | Status: DC
  Administered 2022-12-14 – 2022-12-16 (×6): 500 mg via ORAL
  Filled 2022-12-14 (×6): qty 1

## 2022-12-14 MED ORDER — APIXABAN 2.5 MG PO TABS
2.5000 mg | ORAL_TABLET | Freq: Two times a day (BID) | ORAL | Status: DC
  Administered 2022-12-14 – 2022-12-16 (×5): 2.5 mg via ORAL
  Filled 2022-12-14 (×5): qty 1

## 2022-12-14 NOTE — NC FL2 (Signed)
[place] MEDICAID FL2 LEVEL OF CARE FORM     IDENTIFICATION  Patient Name: Tara Fields Birthdate: 29-Jul-1928 Sex: female Admission Date (Current Location): 12/12/2022  [facility] and [place] Number:  Producer, television/film/video and Address:  [facility], [address] [place], [place] [address]      Provider Number: [id number]  Attending Physician Name and Address:  Maretta Bees, MD  Relative Name and Phone Number:       Current Level of Care: Hospital Recommended Level of Care: Skilled Nursing Facility Prior Approval Number:    Date Approved/Denied:   PASRR Number: [id number]  Discharge Plan: SNF    Current Diagnoses: Patient Active Problem List   Diagnosis Date Noted   Acute CVA (cerebrovascular accident) (HCC) 12/13/2022   Chronic pain 12/13/2022   Prolonged QT interval 12/13/2022   Dyslipidemia 09/07/2022   Essential hypertension 09/07/2022   Vitamin B12 deficiency 09/07/2022   Type 2 diabetes mellitus with chronic kidney disease, without long-term current use of insulin (HCC) 09/07/2022   Hard skin lesion 08/06/2022   Anxiety with agitation 07/06/2022   Stress due to family tension 07/06/2022   Pain of upper abdomen 12/12/2021   Difficulty swallowing pills 12/12/2021   Palliative care encounter 11/23/2021   Malnutrition of moderate degree 11/09/2021   Type 2 diabetes mellitus with hyperlipidemia (HCC) 11/08/2021   DNR (do not resuscitate) 11/08/2021   Leg cramps 09/03/2021   Acute renal failure superimposed on stage 3b chronic kidney disease (HCC)    Hyperlipidemia associated with type 2 diabetes mellitus (HCC)    Overweight (BMI 25.0-29.9)    Generalized weakness 06/07/2021   GERD (gastroesophageal reflux disease) 06/07/2021   Chronic systolic congestive heart failure (HCC) 05/05/2021   Anemia 04/13/2021   Symptomatic anemia 04/13/2021   Stage 3b chronic kidney disease (CKD) (HCC) 04/13/2021   Cognitive deficits     Pressure injury of skin 12/19/2020   COPD (chronic obstructive pulmonary disease) (HCC) 09/02/2020   Acute respiratory failure with hypoxia (HCC) 09/01/2020   Pulmonary nodule 1 cm or greater in diameter 01/03/2020   S/P TAVR (transcatheter aortic valve replacement) 05/03/2019   Primary osteoarthritis involving multiple joints 05/03/2019   Persistent atrial fibrillation (HCC) 05/03/2019   Bilateral carotid artery stenosis 12/01/2017   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/13/2014   Aortic valve disorder 05/03/2014   Chronic obstructive bronchitis without exacerbation 05/03/2014   Derangement of left knee 05/03/2014   Esophageal reflux 05/03/2014   HTN (hypertension) 05/03/2014   Vitamin D deficiency 05/03/2014    Orientation RESPIRATION BLADDER Height & Weight     Self, Time, Situation, Place  Normal Incontinent Weight: 138 lb 3.7 oz (62.7 kg) Height:  5' (152.4 cm)  BEHAVIORAL SYMPTOMS/MOOD NEUROLOGICAL BOWEL NUTRITION STATUS      Continent Diet (See dc summary)  AMBULATORY STATUS COMMUNICATION OF NEEDS Skin   Extensive Assist Verbally Normal                       Personal Care Assistance Level of Assistance  Bathing, Feeding, Dressing Bathing Assistance: Limited assistance Feeding assistance: Limited assistance Dressing Assistance: Limited assistance     Functional Limitations Info             SPECIAL CARE FACTORS FREQUENCY  PT (By licensed PT), OT (By licensed OT)     PT Frequency: 5x/week OT Frequency: 5x/week            Contractures Contractures Info: Not present  Additional Factors Info  Code Status, Allergies, Psychotropic Code Status Info: DNR Allergies Info: Amlodipine Besy-benazepril Hcl, Hctz (Hydrochlorothiazide), Other, Cyclobenzaprine, Diclofenac Sodium, Hydralazine, Methylprednisolone, Oxycodone Psychotropic Info: Seroquel         Current Medications (12/14/2022):  This is the current hospital active medication list Current  Facility-Administered Medications  Medication Dose Route Frequency Provider Last Rate Last Admin   acetaminophen (TYLENOL) tablet 650 mg  650 mg Oral Q4H PRN Jonah Blue, MD   650 mg at 12/13/22 1749   Or   acetaminophen (TYLENOL) 160 MG/5ML solution 650 mg  650 mg Per Tube Q4H PRN Jonah Blue, MD       Or   acetaminophen (TYLENOL) suppository 650 mg  650 mg Rectal Q4H PRN Jonah Blue, MD       aspirin suppository 300 mg  300 mg Rectal Daily Jonah Blue, MD   300 mg at 12/13/22 1610   Or   aspirin tablet 325 mg  325 mg Oral Daily Jonah Blue, MD   325 mg at 12/14/22 0912   atorvastatin (LIPITOR) tablet 40 mg  40 mg Oral Daily Jonah Blue, MD   40 mg at 12/14/22 0913   fentaNYL (SUBLIMAZE) injection 25 mcg  25 mcg Intravenous Q4H PRN Maretta Bees, MD   25 mcg at 12/14/22 1051   lidocaine (LIDODERM) 5 % 1 patch  1 patch Transdermal Q24H Jonah Blue, MD   1 patch at 12/13/22 1821   LORazepam (ATIVAN) injection 0.5 mg  0.5 mg Intravenous Once PRN Elmer Picker, NP       LORazepam (ATIVAN) injection 1 mg  1 mg Intravenous Once PRN Elmer Picker, NP       metoprolol succinate (TOPROL-XL) 24 hr tablet 50 mg  50 mg Oral BID Ghimire, Werner Lean, MD       nitroGLYCERIN (NITROSTAT) SL tablet 0.4 mg  0.4 mg Sublingual Q5 min PRN Jonah Blue, MD   0.4 mg at 12/13/22 1233   ondansetron (ZOFRAN) injection 4 mg  4 mg Intravenous Q6H PRN Jonah Blue, MD       oxyCODONE-acetaminophen (PERCOCET/ROXICET) 5-325 MG per tablet 1 tablet  1 tablet Oral Q6H PRN Maretta Bees, MD       QUEtiapine (SEROQUEL) tablet 12.5 mg  12.5 mg Oral Noemi Chapel, MD   12.5 mg at 12/13/22 2138   senna-docusate (Senokot-S) tablet 1 tablet  1 tablet Oral QHS PRN Jonah Blue, MD       traMADol Janean Sark) tablet 50 mg  50 mg Oral TID Jonah Blue, MD   50 mg at 12/14/22 0913   traZODone (DESYREL) tablet 25 mg  25 mg Oral QHS PRN Jonah Blue, MD         Discharge  Medications: Please see discharge summary for a list of discharge medications.  Relevant Imaging Results:  Relevant Lab Results:   Additional Information SSN: [id number] [place], [place]

## 2022-12-14 NOTE — Progress Notes (Addendum)
ANTICOAGULATION CONSULT NOTE - Initial Consult  Pharmacy Consult for apixaban Indication: atrial fibrillation  Allergies  Allergen Reactions   Amlodipine Besy-Benazepril Hcl Cough   Hctz [Hydrochlorothiazide] Other (See Comments)    Per family, every diuretic the patient has been on flushes out her out and ultimately results in CRAMPING/JAUNDICE (added potassium might help)   Other Other (See Comments)    History of diverticulitis- CANNOT TOLERATE NUTS, CORN, AND ANY FOODS NOT EASILY DIGESTED- the patient AVOIDS these   Cyclobenzaprine Other (See Comments)    Confusion- does not tolerate well    Diclofenac Sodium Diarrhea   Hydralazine Other (See Comments)    Per family, every diuretic the patient has been on flushes out her out and ultimately results in CRAMPING/JAUNDICE (added potassium might help)   Methylprednisolone Other (See Comments)    Medrol/CORTICOSTEROIDS = A LOT OF ENERGY Delusions    Oxycodone Other (See Comments)    Patient becomes very disorientated     Patient Measurements: Height: 5' (152.4 cm) Weight: 62.7 kg (138 lb 3.7 oz) IBW/kg (Calculated) : 45.5   Vital Signs: Temp: 98.1 F (36.7 C) (04/30 1152) Temp Source: Axillary (04/30 1152) BP: 124/67 (04/30 1152) Pulse Rate: 80 (04/30 1152)  Labs: Recent Labs    12/11/22 1344 12/11/22 1506 12/12/22 2215 12/13/22 1219 12/13/22 1454  HGB 11.9*  --  11.1*  --   --   HCT 35.0*  --  33.9*  --   --   PLT  --   --  172  --   --   CREATININE 1.30*  --  1.25*  --   --   TROPONINIHS  --    < > 19* 16 17   < > = values in this interval not displayed.    Estimated Creatinine Clearance: 23.8 mL/min (A) (by C-G formula based on SCr of 1.25 mg/dL (H)).   Medical History: Past Medical History:  Diagnosis Date   Acute cystitis without hematuria 02/03/2022   Acute on chronic combined systolic and diastolic CHF (congestive heart failure) (HCC)    Acute on chronic systolic (congestive) heart failure (HCC)  09/07/2022   Aortic stenosis    s/p TAVR 26 mm Edwards Sapien 3 THV July 2020   Atrial fibrillation with rapid ventricular response (HCC) 09/02/2021   CAD (coronary artery disease)    COPD (chronic obstructive pulmonary disease) (HCC)    COVID-19 virus infection 06/07/2021   Diabetes mellitus (HCC)    Diverticulitis    Elevated troponin 09/02/2021   GERD (gastroesophageal reflux disease)    GI bleed 04/13/2021   HTN (hypertension)    Hyperlipidemia    Lab test positive for detection of COVID-19 virus    Legally blind in left eye, as defined in [place]    Tobacco abuse    UTI (urinary tract infection) 11/10/2021     Assessment: 89 yo woman admitted for CVA with a history of afib on apixaban PTA. Unclear how complaint is with apixaban at home. Patient came to ED on 4/27 after multiple falls. Patient stated she only took pain medication since. Pharmacy consulted to restart apixaban.  Given patients age > 89, weight close to 60kg and Scr at 1.25 with fall risk and starting aspirin 81mg , team agrees with decreasing apixaban to 2.5mg  BID.  Goal of Therapy:    Monitor platelets by anticoagulation protocol: Yes   Plan:  Apixaban 2.5mg  BID  Monitor for signs/symptoms of bleeding    Alphia Moh, PharmD, BCPS, BCCP Clinical  Pharmacist  Please check AMION for all [place] phone numbers After 10:00 PM, call [place] [phone]

## 2022-12-14 NOTE — Plan of Care (Signed)

## 2022-12-14 NOTE — Progress Notes (Addendum)
STROKE TEAM PROGRESS NOTE   INTERVAL HISTORY Continue eliquis (decreased 2.5mg ) and ASA 81mg  Received fentanyl and is sleeping on our exam. Increased weakness of left arm and leg.  Son is at the bedside.   Vitals:   12/13/22 1700 12/13/22 1900 12/14/22 0000 12/14/22 0800  BP:  122/81 136/63   Pulse: 64 72 62   Resp: (!) 22 12 15    Temp:  98.1 F (36.7 C) 97.9 F (36.6 C) 98 F (36.7 C)  TempSrc:  Oral Oral Axillary  SpO2: 95% 96% 95%   Weight:      Height:       CBC:  Recent Labs  Lab 12/11/22 1322 12/11/22 1344 12/12/22 2215  WBC 7.3  --  6.1  NEUTROABS  --   --  4.6  HGB 12.2 11.9* 11.1*  HCT 36.5 35.0* 33.9*  MCV 94.1  --  94.2  PLT 158  --  172    Basic Metabolic Panel:  Recent Labs  Lab 12/11/22 1322 12/11/22 1344 12/12/22 2215  NA 138 139 137  K 4.4 4.2 3.7  CL 101 104 100  CO2 24  --  22  GLUCOSE 142* 138* 114*  BUN 39* 35* 36*  CREATININE 1.29* 1.30* 1.25*  CALCIUM 9.5  --  9.0    Lipid Panel:  Recent Labs  Lab 12/13/22 1219  CHOL 105  TRIG 97  HDL 32*  CHOLHDL 3.3  VLDL 19  LDLCALC 54   HgbA1c:  Recent Labs  Lab 12/13/22 1249  HGBA1C 6.6*   Urine Drug Screen: No results for input(s): "LABOPIA", "COCAINSCRNUR", "LABBENZ", "AMPHETMU", "THCU", "LABBARB" in the last 168 hours.  Alcohol Level  Recent Labs  Lab 12/11/22 1305  ETH <10     IMAGING past 24 hours CT ANGIO HEAD NECK W WO CM  Result Date: 12/14/2022 CLINICAL DATA:  89 year old female with neurologic deficit. Motion degraded MRI yesterday suspicious for posterior right frontal, parietal region acute infarct. EXAM: CT ANGIOGRAPHY HEAD AND NECK WITH AND WITHOUT CONTRAST TECHNIQUE: Multidetector CT imaging of the head and neck was performed using the standard protocol during bolus administration of intravenous contrast. Multiplanar CT image reconstructions and MIPs were obtained to evaluate the vascular anatomy. Carotid stenosis measurements (when applicable) are obtained  utilizing NASCET criteria, using the distal internal carotid diameter as the denominator. RADIATION DOSE REDUCTION: This exam was performed according to the departmental dose-optimization program which includes automated exposure control, adjustment of the mA and/or kV according to patient size and/or use of iterative reconstruction technique. CONTRAST:  75mL OMNIPAQUE IOHEXOL 350 MG/ML SOLN COMPARISON:  Brain MRI yesterday.  Head CT 12/12/2022. FINDINGS: CT HEAD Brain: Calcified atherosclerosis at the skull base. No midline shift, mass effect, or evidence of intracranial mass lesion. No ventriculomegaly No acute intracranial hemorrhage identified. Mild motion artifact despite repeated imaging attempts but there is mild asymmetric hypodensity in the groove posterosuperior right frontal lobe series 16, image 12 which is concordant with recent infarct there. Grossly stable gray-white differentiation otherwise. Calvarium and skull base: Mild motion artifact. Hyperostosis of the skull. No acute osseous abnormality identified. Paranasal sinuses: Visualized paranasal sinuses and mastoids are stable and well aerated. Orbits: Broad-based posterior vertex scalp hematoma may be mildly progressed on series 6, image 68. Orbits soft tissues appears stable and negative. CTA NECK Skeleton: Absent dentition. Advanced cervical spine degeneration, including C1-C2. No acute osseous abnormality identified. Upper chest: Centrilobular emphysema with superimposed pulmonary septal thickening bilaterally. Partially visible layering pleural effusions, larger on  the left with simple fluid density on that side. Visible left main pulmonary artery appears patent. No superior mediastinal lymphadenopathy. Other neck: No acute finding. Aortic arch: Extensive Calcified aortic atherosclerosis. 3 vessel arch. Right carotid system: Moderate proximal brachiocephalic artery calcified plaque with up to 50% stenosis. Mild plaque at the right CCA origin.  Occluded right CCA she proximal to the bifurcation, with contrast enhancement of the vessel gradually terminating by the level of the larynx, glottis. Bulky calcified atherosclerosis at the right carotid bifurcation. However, both the proximal right ECA and ICA are reconstituted. The right ICA is diminutive but patent to the skull base. Left carotid system: Moderate left CCA origin calcified plaque with less than 50% stenosis. Intermittent calcified plaque before the left carotid bifurcation is not hemodynamically significant. Bulky calcified plaque at the bifurcation primarily affects the ICA origin. Estimated stenosis is 64 % with respect to the distal vessel. See series 19, image 115. The left ICA remains patent with mild tortuosity below the skull base. Mild calcified plaque also at the left ICA bulb. Vertebral arteries: Mild proximal right subclavian artery calcified plaque without stenosis. Right vertebral artery origin remains normal. Tortuous right V1 segment. Mild right V2 segment calcified plaque with no significant stenosis to the skull base. Proximal left subclavian artery calcified plaque is initially moderate, but bulky beginning about 12 mm distal to the origin and high-grade stenosis is demonstrated on series 9, image 265, numerically estimated at 80 % with respect to the distal vessel. The left subclavian remains patent and despite adjacent calcified plaque the left vertebral artery origin is patent without stenosis on series 19, image 153. Left vertebral artery is codominant and patent to the skull base with mild V3 segment calcified plaque. CTA HEAD Posterior circulation: Proximal V4 segment calcified plaque bilaterally with only mild stenosis which is greater on the left. Tortuous vertebrobasilar junction is patent without stenosis. Normal right PICA origin. Left AICA may be dominant. Patent basilar artery, diminutive. Fetal type left PCA origin. Patent SCA and PCA origins. Right posterior  communicating artery is diminutive or absent. Left PCA branches are within normal limits. There is calcified plaque of the right PCA P3 with moderate stenosis there. Anterior circulation: Right ICA siphon is patent although with asymmetrically decreased enhancement compared to the left. Moderate superimposed right siphon calcified plaque and mild to moderate right ICA supraclinoid stenosis. The right ICA terminus is patent although asymmetrically decreased enhancement is noted there. Right MCA and ACA origins are patent. Contralateral left ICA siphon is patent with intense enhancement. Moderate left siphon calcified plaque but only mild left siphon stenosis. Normal left posterior communicating artery origin. Normal left ICA terminus, left MCA and ACA origins. Left ACA enhancement is dominant throughout. Diminutive or absent anterior communicating artery. Right ACA appears patent although with less enhancement. Left MCA M1 segment and bifurcation are patent without stenosis. Left MCA branches are within normal limits. Right MCA M1 remains patent although with asymmetrically decreased enhancement throughout. Right MCA bifurcation is patent. Right MCA M2 and M3 branches are not well evaluated due to decreased enhancement. Venous sinuses: Early contrast timing, not well evaluated. Anatomic variants: Fetal type left PCA origin. Review of the MIP images confirms the above findings IMPRESSION: 1. 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 Reconstituted right ICA and ECA despite superimposed bulky calcified plaque throughout the right carotid bifurcation. Asymmetrically diminished Right ICA and right Anterior circulation enhancement, such that the right M2 and distal MCA branches are not well  evaluated. 2. Widespread bulky calcified atherosclerosis elsewhere from the upper chest to the skull base, also notable for: - up to Moderate superimposed calcified plaque of both ICA siphons. - Bulky calcified plaque at the  Left ICA origin with estimated 64% stenosis. - High-grade stenosis of the Left Subclavian Artery, estimated 80% stenosis. - calcified plaque right PCA P3. 3. Partially visible layering pleural effusions, larger on the left. Pulmonary septal thickening compatible with Pulmonary Edema. Aortic Atherosclerosis (ICD10-I70.0) and Emphysema (ICD10-J43.9). 4. Broad-based posterior vertex scalp hematoma may be mildly progressed since yesterday Study discussed by telephone with Dr. Rollene Fare on 12/14/2022 at 06:20 . Electronically Signed   By: Odessa Fleming M.D.   On: 12/14/2022 06:29   EEG adult  Result Date: 12/13/2022 Charlsie Quest, MD     12/13/2022 11:15 AM Patient Name: Tara Fields MRN: [id number] Epilepsy Attending: Charlsie Quest Referring Physician/Provider: Gordy Councilman, MD Date: 12/13/2022 Duration: 23.34 mins Patient history: 89 year old female with left frontal stroke getting EEG to evaluate for seizure. Level of alertness: Awake AEDs during EEG study: None Technical aspects: This EEG study was done with scalp electrodes positioned according to the 10-20 International system of electrode placement. Electrical activity was reviewed with band pass filter of 1-70Hz , sensitivity of 7 uV/mm, display speed of 2mm/sec with a 60Hz  notched filter applied as appropriate. EEG data were recorded continuously and digitally stored.  Video monitoring was available and reviewed as appropriate. Description: The posterior dominant rhythm consists of 7.5 Hz activity of moderate voltage (25-35 uV) seen predominantly in posterior head regions, symmetric and reactive to eye opening and eye closing. EEG showed intermittent generalized 3 to 6 Hz theta-delta slowing. Hyperventilation and photic stimulation were not performed.   ABNORMALITY - Intermittent slow, generalized IMPRESSION: This study is suggestive of mild diffuse encephalopathy. No seizures or epileptiform discharges were seen throughout the recording. Priyanka Annabelle Harman    PHYSICAL EXAM Constitutional: Appears elderly  Cardiovascular: Perfusing extremities well Respiratory: Effort normal, non-labored breathing   Neuro: Mental Status: Patient is somnolent.  She briefly follows some commands intermittently.  She is able to name pinky and thumb but otherwise does not participate in language testing.  She reports that it is June or July and she is 89 years old.  When asked where she is she initially responds [place], when asked the name of the building she states it is some sort of hospital. Cranial Nerves: II: Pupils are round, and reactive to light, difficult to assess if equal due to eye closure.   III,IV, VI: Orients to examiner bilaterally does seem to orient more briskly to the right than the left V: Facial sensation is symmetric to light eyelash brush VII: Facial movement is symmetric on grimace.  VIII: hearing is intact to voice Remainder unable to assess due to mental status Motor/sensory: Right upper extremity is at least antigravity but quickly drifts to the bed as she does not want to hold it up because she is sleepy, despite multiple attempts to test her.  Left upper extremity has at least trace movement.  She will not maintain either of her lower extremities antigravity.  No clear sensory deficit Plantars reflexes: Toes are downgoing bilaterally.  Cerebellar: Unable to assess secondary to patient's mental status Gait:  Unable to assess secondary to patient's mental status   ASSESSMENT/PLAN Ms. Donnika Kucher is a 89 y.o. 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  Stroke:  Possible right MCA infarct, due to right CCA occlusion with extensive bifurcation athero and  stenosis and diminished R MCA flow Code Stroke CT head No acute abnormality. Small vessel disease. Atrophy. ASPECTS 10.    MRI severe motion degraded. Small acute infarct is still suspected at the high right frontal parietal junction.  CT Head - No acute abnormality CTA Head and Neck-  Positive for Occluded Right CCA in the lower neck. Reconstituted right ICA and ECA despite superimposed bulky calcified plaque throughout the right carotid bifurcation. Asymmetrically diminished Right ICA and right Anterior circulation enhancement, such that the right M2 and distal MCA branches are not well evaluated. Widespread bulky calcified atherosclerosis elsewhere from the upper chest to the skull base, also notable for: - up to Moderate superimposed calcified plaque of both ICA siphons. Bulky calcified plaque at the Left ICA origin with estimated 64% stenosis. High-grade stenosis of the Left Subclavian Artery, estimated 80% stenosis. Calcified plaque right PCA P3. MRI not able to perform given intolerance EEG-diffuse encephalopathy, no seizures or epileptiform discharges 2D Echo EF 20-25% LA severely dilated  LDL 49 HgbA1c 6.7 VTE prophylaxis -Lovenox Eliquis (apixaban) daily prior to admission, resume Eliquis 2.5mg  BID and ASA 81mg  Therapy recommendations: SNF Disposition: Pending, palliative care on board  Atrial fibrillation Eliquis 5 mg twice daily PTA Currently on aspirin 325 Rate controlled Resume eliquis 2.5mg  and add ASA 81.   Carotid occlusion/stenosis CTA Head and Neck-  Positive for Occluded Right CCA in the lower neck. Reconstituted right ICA and ECA despite superimposed bulky calcified plaque throughout the right carotid bifurcation. Asymmetrically diminished Right ICA and right Anterior circulation enhancement, such that the right M2 and distal MCA branches are not well evaluated.  Likely acute on chronic R CCA occlusion, may or may not related to AFib Add ASA on top of  aricept  Hypertension Coronary Artery Disease Congestive Heart Failure Home meds: Metoprolol succinate, terazosin, torsemide S/p prosthetic aortic valve Long-term BP goal normotensive Low EF chronically Euvolemic  Hyperlipidemia Home meds: Atorvastatin, resumed in hospital LDL 49, goal < 70 Continue statin at discharge  Other Stroke Risk Factors Former cigarette smoker Quit May 2021  Other Active Problems S/p bioprosthetic TAVR 2018 [facility] lung cancer s/p Rx  Dementia  Seroquel, trazodone Prolonged Qtc- avoid haldol Chronic pain- neck, back Tramadol   Fentanyl per primary team  Hospital day # 1  Patient seen and examined by NP/APP with MD. MD to update note as needed.   Elmer Picker, DNP, FNP-BC [facility] Pager: [phone]  ATTENDING NOTE: I reviewed above note and agree with the assessment and plan. Pt was seen and examined.   Son at bedside.  Patient drowsy sleepy after fentanyl 25 mcg for pain around 11 AM.  However, patient was able to open eyes on voice, follows some simple commands.  However seems to have more obvious left facial droop than yesterday, seem to have left hemianopia versus neglect, left upper extremity 0/5, left lower extremity slight withdrawal to pain stimulation.  Patient symptoms worsened than yesterday, overall picture consistent with right MCA infarct in the setting of right CCA occlusion, right ICA and MCA diminished flow and a failed collateral.  Patient not a candidate for any intervention, recommend avoid low BP, BP goal 130-150, continue Eliquis and add baby aspirin on top of Eliquis.  Continue statin.  PT/OT.  Had long discussion with son at the bedside, discussed with him regarding patient worsening neuro exam, severe vasculopathy and intracranial stenosis/occlusion, and potential poor recovery.  Son expressed understanding and still requested continue medical treatment without surgical intervention and possible SNF placement.   Palliative care on board.  I also discussed with Dr. Jerral Ralph.   For detailed assessment and plan, please refer to above/below as I have made changes wherever appropriate.   Marvel Plan, MD PhD Stroke Neurology 12/14/2022 5:56 PM  Neurology will sign off. Please call with questions. Pt will follow up with stroke clinic Dr Pearlean Brownie at [facility] in about 4 weeks. Thanks for the consult.   To contact Stroke Continuity provider, please refer to [email or web address]. After hours, contact General Neurology

## 2022-12-14 NOTE — TOC Initial Note (Signed)
Transition of Care [facility]) - Initial/Assessment Note    Patient Details  Name: Tara Fields MRN: [id number] Date of Birth: 1927-08-25  Transition of Care [facility]) CM/SW Contact:    Mearl Latin, LCSW Phone Number: 12/14/2022, 3:06 PM  Clinical Narrative:                 CSW received consult for possible SNF placement at time of discharge. CSW spoke with patient's son regarding the questions he had. He reported that patient resides in an independent living facility and he is currently unable to care for patient at their home given patient's current physical needs and fall risk. He expressed understanding of PT recommendation and is agreeable to SNF placement at time of discharge. CSW discussed insurance authorization process and will provide Medicare SNF ratings list in room as requested. CSW provided list of bed offers and will continue tol follow.   Skilled Nursing Rehab Facilities-   [email or web address]   Ratings out of 5 stars (5 the highest)   Name Address  Phone # Quality Care Staffing Health Inspection Overall  [facility] [address] [phone] 1 1 5 3   [facility] [address] [place] [phone] 4 1 3 2   [facility] [address], [place] Otto [phone] 3 1 1 1   [facility] [address] [place] [phone] 3 1 3 2   [facility]  [address], [place] [phone] 4 2 5 5   [facility] [address] [facility] [phone] 2 1 2 1   [facility] [address], [place] [phone] 3 1 2 1   [facility] [address] [place] [phone] 3 4 3 3   [address] ([facility]) [address] [place] [phone] 2 2 2 2   [facility] [address] [place], [place] [phone] 1 2 1 1   [facility] ([place]) [address], [place] [phone] 3 1 1 1   [facility] [address] [place] [phone] 5 2 4  5   [facility] [address] [place] [phone] 4 4 3 3           [facility] [address], [place] [phone]      [facility] EX[facility]IR[facility], [place] [address], [place] [phone] 1 1 2 1   [facility] [address], [place] [phone] 2 2 4 4   [facility]  [address], [place]itto [phone] 2 1 4 3   [facility] [address] [place] [phone] 3 3 3 3           [address] (no [facility]) [address], [place] [phone] 4 4 5 5   [facility] (No Hum[employer]) [address] [place] [phone] 3 1 4 3   [facility] [address], [place] [phone] 2 1 2 1   [facility] (No UHC) [address], [place] [phone] 5 5 5 5   [facility] [address] [facility] [phone] 2 3 5 5   [facility] [address], [place] [phone] 2 1 1 1   [facility] [address] [place] [phone] 5 2 5 5   [facility]  [address], [place] [phone] 2 2 2 2   [facility] [address] [place] [phone] 3 2 1 1   [facility] [address] [place], [place] [phone] 2 2 3 3           [facility] [address], [place] [phone] 1 1 1 1   [facility] [address],  [place]  [phone] 2 3 4 4   [facility] (No Humana) [address] [place] [phone] 2 2 3 3   [facility]) [address], [place]an [phone] 1 1 1 1   [facility] [address] [place]an [phone] 2 2 5 5   [facility] [address], [place] [phone] 2 1 1 1           [facility] [address] [place], [place] [phone] 5 4 5 5   [facility])  [address], [place] [phone] 2 1 2 1   [facility]) [address], [place] [phone]  2 4 4   [facility] [address], [place] [phone] 3 4 4 4   [address] [place], [place]  [phone] 2 2 2 2   [facility]) [address] [place] [phone] 2 1 3 2      Expected Discharge Plan: Skilled Nursing Facility Barriers to Discharge: Continued Medical Work up, SNF Pending bed offer   Patient Goals and CMS Choice Patient states their goals for this hospitalization and ongoing recovery are:: Therapy CMS Medicare.gov Compare Post Acute Care list provided to:: Patient Represenative (must comment) Choice offered to / list presented to : Adult Children Pray ownership interest in [facility].provided to:: Adult Children    Expected Discharge Plan and Services In-house Referral: Clinical Social Work   Post Acute Care Choice: Skilled Nursing Facility Living arrangements for the past 2 months: Independent Living Facility                                      Prior Living Arrangements/Services Living arrangements for the past 2 months: Independent Living Facility Lives with:: Self Patient language and need for interpreter reviewed:: Yes Do you feel safe going back to the place where you live?: Yes      Need for Family Participation in Patient Care: Yes (Comment) Care giver support system in place?: Yes (comment) Current home services: DME (walker and shower chair) Criminal Activity/Legal Involvement Pertinent to Current Situation/Hospitalization: No - Comment as needed  Activities of Daily Living      Permission Sought/Granted Permission sought to share information with : Facility Medical sales representative, Family Supports Permission granted to share information with : Yes, Verbal Permission Granted  Share Information with NAME: [name]  Permission granted to share info w AGENCY: SNFs  Permission granted to share info w Relationship: Son  Permission granted to share info w Contact Information: [phone]  Emotional Assessment Appearance:: Appears stated age Attitude/Demeanor/Rapport: Gracious Affect  (typically observed): Appropriate Orientation: : Oriented to Self, Oriented to Place, Oriented to  Time, Oriented to Situation Alcohol / Substance Use: Not Applicable Psych Involvement: No (comment)  Admission diagnosis:  Acute CVA (cerebrovascular accident) [facility] [place] [facility]) [I63.9] Patient Active Problem List   Diagnosis Date Noted   Acute CVA (cerebrovascular accident) (HCC) 12/13/2022   Chronic pain 12/13/2022   Prolonged QT interval 12/13/2022   Dyslipidemia 09/07/2022   Essential hypertension 09/07/2022   Vitamin B12 deficiency 09/07/2022   Type 2 diabetes mellitus with chronic kidney disease, without long-term current use of insulin (HCC) 09/07/2022   Hard skin lesion 08/06/2022   Anxiety with agitation 07/06/2022   Stress due to family tension 07/06/2022   Pain of upper abdomen 12/12/2021   Difficulty swallowing pills 12/12/2021   Palliative care encounter 11/23/2021  Malnutrition of moderate degree 11/09/2021   Type 2 diabetes mellitus with hyperlipidemia (HCC) 11/08/2021   DNR (do not resuscitate) 11/08/2021   Leg cramps 09/03/2021   Acute renal failure superimposed on stage 3b chronic kidney disease (HCC)    Hyperlipidemia associated with type 2 diabetes mellitus (HCC)    Overweight (BMI 25.0-29.9)    Generalized weakness 06/07/2021   GERD (gastroesophageal reflux disease) 06/07/2021   Chronic systolic congestive heart failure (HCC) 05/05/2021   Anemia 04/13/2021   Symptomatic anemia 04/13/2021   Stage 3b chronic kidney disease (CKD) (HCC) 04/13/2021   Cognitive deficits    Pressure injury of skin 12/19/2020   COPD (chronic obstructive pulmonary disease) (HCC) 09/02/2020   Acute respiratory failure with hypoxia (HCC) 09/01/2020   Pulmonary nodule 1 cm or greater in diameter 01/03/2020   S/P TAVR (transcatheter aortic valve replacement) 05/03/2019   Primary osteoarthritis involving multiple joints 05/03/2019   Persistent atrial fibrillation (HCC) 05/03/2019   Bilateral  carotid artery stenosis 12/01/2017   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/13/2014   Aortic valve disorder 05/03/2014   Chronic obstructive bronchitis without exacerbation 05/03/2014   Derangement of left knee 05/03/2014   Esophageal reflux 05/03/2014   HTN (hypertension) 05/03/2014   Vitamin D deficiency 05/03/2014   PCP:  Shirline Frees, NP Pharmacy:   [id number] [place] [place] - [address] AT [facility] [address] [place] [address] Phone: [phone] Fax: [phone]     Social Determinants of Health (SDOH) Social History: SDOH Screenings   Food Insecurity: No Food Insecurity (09/09/2022)  Housing: Low Risk  (09/09/2022)  Transportation Needs: No Transportation Needs (09/09/2022)  Utilities: Not At Risk (09/09/2022)  Alcohol Screen: Low Risk  (03/17/2021)  Depression (PHQ2-9): High Risk (09/14/2022)  Financial Resource Strain: Low Risk  (08/14/2022)  Physical Activity: Unknown (08/14/2022)  Social Connections: Socially Isolated (08/14/2022)  Stress: No Stress Concern Present (08/14/2022)  Tobacco Use: Medium Risk (12/13/2022)   SDOH Interventions:     Readmission Risk Interventions    12/14/2022    3:00 PM 11/09/2021    2:38 PM 09/04/2021    2:20 PM  Readmission Risk Prevention Plan  Transportation Screening Complete Complete Complete  PCP or Specialist Appt within 3-5 Days   Complete  HRI or Home Care Consult   Complete  Social Work Consult for Recovery Care Planning/Counseling   Complete  Palliative Care Screening   Not Applicable  Medication Review [facility]) Complete Complete Complete  PCP or Specialist appointment within 3-5 days of discharge Complete Complete   HRI or Home Care Consult Complete Complete   SW Recovery Care/Counseling Consult Complete Complete   Palliative Care Screening Complete Not Applicable   Skilled Nursing Facility Complete Not Applicable

## 2022-12-14 NOTE — Evaluation (Signed)
Occupational Therapy Evaluation Patient Details Name: Tara Fields MRN: [id number] DOB: 1928/01/06 Today's Date: 12/14/2022   History of Present Illness 89 y.o. female presents to [facility] hospital with L weakness. 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 PMH: chronic systolic CHF EF 20-25%, h/o TAVr, PAfib on eliquis, COPD, CAD, CKD 3a, aortic stenosis s/p TAVR   Clinical Impression   PTA, pt from an ALF and during prior admission, pt was ambulatory with a Rollator. Pt unable to provide additional background info due to AMS. Pt presents now with no active movement in L nondominant UE and deficits in cognition, strength and pain. Pt able to follow directions fairly consistently though fatigued quickly, unable to advance EOB. Overall, pt requires up to Mod A for basic feeding/grooming tasks and Total A for bed level bathing, dressing and toileting. Recommend postacute rehab stay to maximize functional abilities prior to return to ALF setting.       Recommendations for follow up therapy are one component of a multi-disciplinary discharge planning process, led by the attending physician.  Recommendations may be updated based on patient status, additional functional criteria and insurance authorization.   Assistance Recommended at Discharge Frequent or constant Supervision/Assistance  Patient can return home with the following Two people to help with walking and/or transfers;Two people to help with bathing/dressing/bathroom;Assistance with feeding    Functional Status Assessment  Patient has had a recent decline in their functional status and demonstrates the ability to make significant improvements in function in a reasonable and predictable amount of time.  Equipment Recommendations  Other (comment) (TBD pending progress)    Recommendations for Other Services       Precautions / Restrictions Precautions Precautions: Fall Restrictions Weight  Bearing Restrictions: No      Mobility Bed Mobility Overal bed mobility: Needs Assistance             General bed mobility comments: Mod A for long sitting for neck pillow adjustment per pt request. Quickly fell asleep after bed level ADLs and unable to attempt EOB    Transfers                          Balance                                           ADL either performed or assessed with clinical judgement   ADL Overall ADL's : Needs assistance/impaired Eating/Feeding: Moderate assistance;Bed level Eating/Feeding Details (indicate cue type and reason): hand over hand to bring cup to mouth; required encouragement to attempt without OT assist Grooming: Minimal assistance;Bed level;Wash/dry face Grooming Details (indicate cue type and reason): requesting assist to place washcloth on ears - cued pt to attempt w/ hand over hand and pt able to bring to eyes, ears and nose Upper Body Bathing: Bed level;Total assistance   Lower Body Bathing: Total assistance;Bed level   Upper Body Dressing : Total assistance;Bed level   Lower Body Dressing: Total assistance;Bed level       Toileting- Clothing Manipulation and Hygiene: Total assistance;Bed level         General ADL Comments: No movement in L UE and limited by impaired cognition ( likely worse than baseline acutely)     Vision Ability to See in Adequate Light: 1 Impaired Patient Visual Report: Other (comment) (to be further  assessed; L eye more closed than R) Vision Assessment?: Vision impaired- to be further tested in functional context Additional Comments: to be further assessed;able to open R eye more easily than L eye. question some L sided inattention     Perception     Praxis      Pertinent Vitals/Pain Pain Assessment Pain Assessment: Faces Faces Pain Scale: Hurts little more Pain Location: L shoulder Pain Descriptors / Indicators: Guarding, Grimacing Pain Intervention(s):  Monitored during session     Hand Dominance Right   Extremity/Trunk Assessment Upper Extremity Assessment Upper Extremity Assessment: LUE deficits/detail LUE Deficits / Details: no appreciable AROM of L UE; noted pt able to move UE on PT eval 4/29. Pt able to look to L UE when cued but reports "I cant move it without help" LUE Sensation: decreased light touch;decreased proprioception LUE Coordination: decreased fine motor;decreased gross motor   Lower Extremity Assessment Lower Extremity Assessment: Defer to PT evaluation   Cervical / Trunk Assessment Cervical / Trunk Assessment: Kyphotic   Communication Communication Communication: Expressive difficulties;HOH   Cognition Arousal/Alertness: Awake/alert, Lethargic Behavior During Therapy: WFL for tasks assessed/performed, Flat affect Overall Cognitive Status: History of cognitive impairments - at baseline Area of Impairment: Orientation, Attention, Memory, Following commands, Safety/judgement, Awareness, Problem solving                 Orientation Level: Disoriented to, Time, Situation, Place Current Attention Level: Focused Memory: Decreased recall of precautions, Decreased short-term memory Following Commands: Follows one step commands inconsistently, Follows one step commands with increased time Safety/Judgement: Decreased awareness of safety, Decreased awareness of deficits Awareness: Intellectual Problem Solving: Slow processing, Decreased initiation, Requires verbal cues, Difficulty sequencing General Comments: hx of dementia, follows commands with increased time and encouragement. more appropriate in conversational answers though did intermittently shout out son's names in room and give them directions - able to reorient that they were not in the room currently. requesting a spoon from the kitchen - reoriented to being in the hospital     General Comments       Exercises     Shoulder Instructions      Home  Living Family/patient expects to be discharged to:: Assisted living [facility]) Living Arrangements: Alone Available Help at Discharge: Family;Available PRN/intermittently Type of Home: Assisted living Home Access: Level entry;Elevator     Home Layout: One level     Bathroom Shower/Tub: Producer, television/film/video: Handicapped height     Home Equipment: Rollator (4 wheels);Cane - single point;Shower seat;Grab bars - toilet;Grab bars - tub/shower;Hand held shower head          Prior Functioning/Environment Prior Level of Function : Patient poor historian/Family not available             Mobility Comments: per prior admission in 2023, pt ambulatory with a Rollator ADLs Comments: per prior admission in 2023, pt reported able to manage ADLs though unable to report today due to confusion        OT Problem List: Decreased strength;Decreased activity tolerance;Impaired balance (sitting and/or standing);Decreased range of motion;Decreased coordination;Decreased cognition;Impaired vision/perception;Decreased safety awareness;Impaired UE functional use      OT Treatment/Interventions: Self-care/ADL training;Therapeutic exercise;Energy conservation;DME and/or AE instruction;Therapeutic activities;Patient/family education;Balance training    OT Goals(Current goals can be found in the care plan section) Acute Rehab OT Goals Patient Stated Goal: have some ice water OT Goal Formulation: Patient unable to participate in goal setting Time For Goal Achievement: 12/21/22 Potential to Achieve Goals: Fair  OT Frequency: Min 2X/week    Co-evaluation              AM-PAC OT "6 Clicks" Daily Activity     Outcome Measure Help from another person eating meals?: A Lot Help from another person taking care of personal grooming?: A Little Help from another person toileting, which includes using toliet, bedpan, or urinal?: Total Help from another person bathing (including  washing, rinsing, drying)?: Total Help from another person to put on and taking off regular upper body clothing?: Total Help from another person to put on and taking off regular lower body clothing?: Total 6 Click Score: 9   End of Session Nurse Communication: Mobility status;Other (comment) (LUE)  Activity Tolerance: Patient limited by fatigue Patient left: in bed;with call bell/phone within reach;with bed alarm set  OT Visit Diagnosis: Unsteadiness on feet (R26.81);Other abnormalities of gait and mobility (R26.89);Muscle weakness (generalized) (M62.81);Hemiplegia and hemiparesis Hemiplegia - Right/Left: Left Hemiplegia - dominant/non-dominant: Non-Dominant Hemiplegia - caused by: Cerebral infarction                Time: 2956-2130 OT Time Calculation (min): 16 min Charges:  OT General Charges $OT Visit: 1 Visit OT Evaluation $OT Eval Moderate Complexity: 1 Mod  Bradd Canary, OTR/L Acute Rehab Services Office: [phone]   Lorre Munroe 12/14/2022, 8:59 AM

## 2022-12-14 NOTE — Progress Notes (Signed)
[facility]) Hospital Liaison Note  This is a pending outpatient based palliative care patient with [facility].  [facility] hospital liaison will continue to follow for discharge disposition.   Please call for any outpatient based outpatient palliative care related questions or concerns.   Thank you.   Lynder [facility] Liaison [phone]

## 2022-12-14 NOTE — Consult Note (Signed)
Consultation Note Date: 12/14/2022   Patient Name: Tara Fields  DOB: [birth year]  MRN: [id number]  Age / Sex: 89 y.o., female  PCP: Shirline Frees, NP Referring Physician: Maretta Bees, MD  Reason for Consultation: Establishing goals of care  HPI/Patient Profile: 89 y.o. female  with past medical history of chronic combined CHF, afib, dementia, CAD, COPD, DM, HTN, and HLD presented to ED on 12/12/22 from [place] independent living facility after worsening LUE weakness after previous ED visit on 12/11/22 s/p unwitnessed fall. MRI showed small parietal stroke; though imagine is limited due to patient's poor ability to tolerate. Patient was admitted on 12/12/2022 with CVA.   Clinical Assessment and Goals of Care: I have reviewed medical records including EPIC notes, labs, and imaging. Unable to receive report from primary RN.   Went to visit patient at bedside - no family/visitors present. Patient was lying in bed asleep - I did not attempt to wake to preserve comfort. No signs or non-verbal gestures of pain or discomfort noted. No respiratory distress, increased work of breathing, or secretions noted.   Met with son/Randy via phone  to discuss diagnosis, prognosis, GOC, EOL wishes, disposition, and options.  I introduced Palliative Medicine as specialized medical care for people living with serious illness. It focuses on providing relief from the symptoms and stress of a serious illness. The goal is to improve quality of life for both the patient and the family.  We discussed a brief life review of the patient as well as functional and nutritional status. Patient is widowed - she has two sons. Prior to hospitalization, she was living at [facility] independent living. They provided three meals per day; patient was able to dress/bathe herself and ambulate with a walker within her small one bedroom  apartment. [name] tells me she has a hard time with "long distances." Randy prepared her pills every week. He and his brother each called the patient once per day (two calls to patient per day between the two) to check on her. Patient was able to feed herself; though he states now she needs to be fed. [name] tells me that patient "has been absolutely fine" until Saturday when she fell. Though he later notes she fell Christmas night and she has "been going downhill since."   We discussed patient's current illness and what it means in the larger context of patient's on-going co-morbidities. [name] understands that CHF, CKD, COPD, dementia are progressive, non-curable diseases underlying the patient's current acute medical conditions. [name] has a clear understanding of patient's current acute medical situation. Natural disease trajectory and expectations at EOL were discussed. I attempted to elicit values and goals of care important to the patient. The difference between aggressive medical intervention and comfort care was considered in light of the patient's goals of care. Overall, goal is to treat the treatable without undergoing anesthesia for surgery or MRI.  Detailed discussion was had around advanced care planning. [name] understands that patient will likely need increased level of care going forward. He is interested in looking into LTC facilities and patient not returning to [place] unless they can increase her care. Short term goals include for patient's discharge to rehab while long term goal is for LTC placement.  Discussed that the goal of rehab is improvement/stabalization of functional status,  which can be a difficult goal to meet for patients with advanced illness and multiple medical conditions. Reviewed what is needed for someone to have a positive rehabilitation experience to include  adequate nutritional intake as well as willingness/ability to participate. [name] tells me that patient has  been to rehab several times in the past and "it didn't do any good." [name] has realistic expectations for her time at rehab.  [name] indicates he has insurance questions regarding rehab and LTC - reviewed the the best of my ability but will have TOC call to discuss details.  [name] would also like to ensure there is a prioritization on patient's pain. Reviewed current pain medications ordered. Noted intolerance to oxycodone and son confirms - will adjust to hydrocodone.   Discussed with [name] that patient is already enrolled in outpatient Palliative Care with [facility] - notified their liaison of patient's admission and request for follow up from the hospital sooner than later.   [name] is patient's HCPOA - requested copy of document, which he would like to email to me - HCPOA and Living Will documents obtained.  Discussed with [name] the importance of continued conversation with family and the medical providers regarding overall plan of care and treatment options, ensuring decisions are within the context of the patient's values and GOCs.    Questions and concerns were addressed. [name] was encouraged to call with questions and/or concerns. PMT number was provided.  Discussed case with [facility] liaison.  Primary Decision Maker: HCPOA - son/[name]     SUMMARY OF RECOMMENDATIONS   Continue to treat the treatable without use of anesthesia for surgery or MRI Continue DNR/DNI as previously documented - durable DNR form completed and placed in shadow chart. Copy was made and will be scanned into [facility]/ACP tab Goal is for patient's discharge to rehab to allow time for family to find LTC placement Adjusted Percocet to Norco - patient has intolerance to oxycodone  Scheduled tylenol TID. If combination Tramadol and acetaminophen helps, can consider adjusting to Ultracet TOC consulted for: patient's son has insurance questions regarding rehab and LTC Patient is already enrolled with [facility]  outpatient Palliative Care program - notified liaison of patient's admission and requested follow up after discharge sooner than later Copy of HCPOA and Living Will documents were made and will be scanned into [facility]/ACP tab PMT will continue to follow peripherally. If there are any imminent needs please call the service directly   Code Status/Advance Care Planning: DNR  Palliative Prophylaxis:  Aspiration, Frequent Pain Assessment, Oral Care, and Turn Reposition  Additional Recommendations (Limitations, Scope, Preferences): Full Scope Treatment  Psycho-social/Spiritual:  Desire for further Chaplaincy support:no Created space and opportunity for patient and family to express thoughts and feelings regarding patient's current medical situation.  Emotional support and therapeutic listening provided.  Prognosis:  Unable to determine  Discharge Planning: To Be Determined      Primary Diagnoses: Present on Admission:  Acute CVA (cerebrovascular accident) (HCC)  Type 2 diabetes mellitus with hyperlipidemia (HCC)  Stage 3b chronic kidney disease (CKD) (HCC)  Persistent atrial fibrillation (HCC)  HTN (hypertension)  DNR (do not resuscitate)  Chronic systolic congestive heart failure (HCC)  Cognitive deficits  Chronic pain  Prolonged QT interval   I have reviewed the medical record, interviewed the patient and family, and examined the patient. The following aspects are pertinent.  Past Medical History:  Diagnosis Date   Acute cystitis without hematuria 02/03/2022   Acute on chronic combined systolic and diastolic CHF (congestive heart failure) (HCC)    Acute on chronic systolic (congestive) heart failure (HCC) 09/07/2022   Aortic stenosis    s/p TAVR 26 mm Edwards Sapien 3 THV July  2020   Atrial fibrillation with rapid ventricular response (HCC) 09/02/2021   CAD (coronary artery disease)    COPD (chronic obstructive pulmonary disease) (HCC)    COVID-19 virus infection  06/07/2021   Diabetes mellitus (HCC)    Diverticulitis    Elevated troponin 09/02/2021   GERD (gastroesophageal reflux disease)    GI bleed 04/13/2021   HTN (hypertension)    Hyperlipidemia    Lab test positive for detection of COVID-19 virus    Legally blind in left eye, as defined in [place]    Tobacco abuse    UTI (urinary tract infection) 11/10/2021   Social History   Socioeconomic History   Marital status: Widowed    Spouse name: Not on file   Number of children: 2   Years of education: Not on file   Highest education level: 12th grade  Occupational History   Occupation: Retired    Comment: homemaker  Tobacco Use   Smoking status: Former    Packs/day: 1.00    Years: 70.00    Additional pack years: 0.00    Total pack years: 70.00    Types: Cigarettes    Quit date: 12/15/2019    Years since quitting: 3.0   Smokeless tobacco: Never   Tobacco comments:    Pt states quiting a year ago. 01/03/20 [facility]J  Vaping Use   Vaping Use: Never used  Substance and Sexual Activity   Alcohol use: No    Alcohol/week: 0.0 standard drinks of alcohol   Drug use: No   Sexual activity: Not Currently  Other Topics Concern   Not on file  Social History Narrative   Widowed- was married for 40 years    Has two sons       Social Determinants of Health   Financial Resource Strain: Low Risk  (08/14/2022)   Overall Financial Resource Strain (CARDIA)    Difficulty of Paying Living Expenses: Not hard at all  Food Insecurity: No Food Insecurity (09/09/2022)   Hunger Vital Sign    Worried About Running Out of Food in the Last Year: Never true    Ran Out of Food in the Last Year: Never true  Transportation Needs: No Transportation Needs (09/09/2022)   PRAPARE - Administrator, Civil Service (Medical): No    Lack of Transportation (Non-Medical): No  Physical Activity: Unknown (08/14/2022)   Exercise Vital Sign    Days of Exercise per Week: 0 days    Minutes of Exercise per Session: Not  on file  Stress: No Stress Concern Present (08/14/2022)   [employer] of Occupational Health - Occupational Stress Questionnaire    Feeling of Stress : Only a little  Social Connections: Socially Isolated (08/14/2022)   Social Connection and Isolation Panel [NHANES]    Frequency of Communication with Friends and Family: More than three times a week    Frequency of Social Gatherings with Friends and Family: Three times a week    Attends Religious Services: Never    Active Member of Clubs or Organizations: No    Attends Banker Meetings: Not on file    Marital Status: Widowed   Family History  Problem Relation Age of Onset   Diabetes Mother    Stroke Brother    Cerebral palsy Brother    Coronary artery disease Neg Hx    Scheduled Meds:  aspirin  300 mg Rectal Daily   Or   aspirin  325 mg Oral Daily   atorvastatin  40 mg Oral Daily   feeding supplement  237 mL Oral BID BM   lidocaine  1 patch Transdermal Q24H   metoprolol succinate  50 mg Oral BID   multivitamin with minerals  1 tablet Oral Daily   QUEtiapine  12.5 mg Oral QHS   traMADol  50 mg Oral TID   Continuous Infusions: PRN Meds:.acetaminophen **OR** acetaminophen (TYLENOL) oral liquid 160 mg/5 mL **OR** acetaminophen, fentaNYL (SUBLIMAZE) injection, LORazepam, LORazepam, nitroGLYCERIN, ondansetron (ZOFRAN) IV, oxyCODONE-acetaminophen, senna-docusate, traZODone Medications Prior to Admission:  Prior to Admission medications   Medication Sig Start Date End Date Taking? Authorizing Provider  traMADol (ULTRAM) 50 MG tablet TAKE 1 TABLET BY MOUTH THREE TIMES A DAY 11/03/22  Yes Nafziger, Kandee Keen, NP  acetaminophen (TYLENOL) 500 MG tablet Take 500 mg by mouth 3 (three) times daily as needed for moderate pain. Take with Tramadol    [provider]  atorvastatin (LIPITOR) 40 MG tablet TAKE 1 TABLET BY MOUTH DAILY AT 6 PM. Patient taking differently: Take 40 mg by mouth daily. 06/18/22   Nafziger, Kandee Keen, NP   cholecalciferol (VITAMIN D3) 25 MCG (1000 UNIT) tablet Take 1,000 Units by mouth daily.    [provider]  Coenzyme Q10 (CO Q-10) 300 MG CAPS Take 300 mg by mouth in the morning.    [provider]  donepezil (ARICEPT) 5 MG tablet Take 1 tablet (5 mg total) by mouth at bedtime. Patient not taking: Reported on 12/11/2022 12/07/22   Shirline Frees, NP  ELIQUIS 5 MG TABS tablet TAKE 1 TABLET BY MOUTH TWICE A DAY 09/01/22   Nafziger, Kandee Keen, NP  ferrous gluconate (FERGON) 324 MG tablet TAKE 1 TABLET BY MOUTH DAILY WITH BREAKFAST 09/21/22   Nafziger, Kandee Keen, NP  fluticasone (FLONASE) 50 MCG/ACT nasal spray Place 2 sprays into both nostrils daily as needed for allergies or rhinitis.    [provider]  Lidocaine-Menthol, Spray, 4-1 % LIQD Apply 1 spray topically daily as needed (back/knee pain/restless legs).    [provider]  Magnesium 200 MG TABS Take 200 mg by mouth every other day.    [provider]  metoprolol succinate (TOPROL XL) 50 MG 24 hr tablet Take 1 tablet (50 mg total) by mouth 2 (two) times daily. Take with or immediately following a meal. 12/03/22   Kathleene Hazel, MD  Misc Natural Products (OSTEO BI-FLEX ADV JOINT SHIELD PO) Take 1 tablet by mouth daily.    [provider]  Multiple Vitamins-Minerals (CENTRUM MINIS WOMEN 50+ PO) Take 1 tablet by mouth daily.    [provider]  Omega-3 Fatty Acids (FISH OIL) 1200 MG CAPS Take 1 capsule by mouth daily.    [provider]  ondansetron (ZOFRAN) 4 MG tablet Take 4 mg by mouth every 6 (six) hours as needed for nausea or vomiting.    [provider]  potassium chloride 20 MEQ/15ML (10%) SOLN TAKE 15 MLS (20 MEQ TOTAL) BY MOUTH DAILY. Patient taking differently: Take 20 mEq by mouth daily. 10ml 09/16/22 12/15/22  Dyann Kief, PA-C  psyllium (METAMUCIL) 58.6 % powder Take 1 packet by mouth daily as needed (for constipation).    [provider]   QUEtiapine (SEROQUEL) 25 MG tablet Take 12.5 mg by mouth at bedtime.    [provider]  sodium chloride (OCEAN) 0.65 % SOLN nasal spray Place 1 spray into both nostrils as needed for congestion.    [provider]  spironolactone (ALDACTONE) 25 MG tablet Take 12.5 mg  by mouth daily.    [provider]  terazosin (HYTRIN) 10 MG capsule TAKE 1 CAPSULE BY MOUTH AT BEDTIME. 05/11/22   Nafziger, Kandee Keen, NP  torsemide (DEMADEX) 20 MG tablet Take 2 tablets (40 mg total) by mouth daily. Incase of weight gain 2 to 3 lbs in 24 hrs or 5 lbs in 7 days, take 2 tablets twice a day until weight back to baseline. Patient taking differently: Take 20 mg by mouth 2 (two) times daily. Incase of weight gain 2 to 3 lbs in 24 hrs or 5 lbs in 7 days, take 2 tablets twice a day until weight back to baseline. 09/09/22   Arrien, York Ram, MD  traZODone (DESYREL) 50 MG tablet Take 25 mg by mouth at bedtime as needed for sleep.    [provider]  vitamin B-12 (CYANOCOBALAMIN) 1000 MCG tablet Take 1 tablet (1,000 mcg total) by mouth daily. 04/16/21   Meredeth Ide, MD   Allergies  Allergen Reactions   Amlodipine Besy-Benazepril Hcl Cough   Hctz [Hydrochlorothiazide] Other (See Comments)    Per family, every diuretic the patient has been on flushes out her out and ultimately results in CRAMPING/JAUNDICE (added potassium might help)   Other Other (See Comments)    History of diverticulitis- CANNOT TOLERATE NUTS, CORN, AND ANY FOODS NOT EASILY DIGESTED- the patient AVOIDS these   Cyclobenzaprine Other (See Comments)    Confusion- does not tolerate well    Diclofenac Sodium Diarrhea   Hydralazine Other (See Comments)    Per family, every diuretic the patient has been on flushes out her out and ultimately results in CRAMPING/JAUNDICE (added potassium might help)   Methylprednisolone Other (See Comments)    Medrol/CORTICOSTEROIDS = A LOT OF ENERGY Delusions    Oxycodone Other (See  Comments)    Patient becomes very disorientated    Review of Systems  Unable to perform ROS: Mental status change    Physical Exam Vitals and nursing note reviewed.  Constitutional:      General: She is not in acute distress.    Appearance: She is ill-appearing.  Pulmonary:     Effort: No respiratory distress.  Skin:    General: Skin is warm and dry.  Neurological:     Motor: Weakness present.     Comments: asleep     Vital Signs: BP 124/67 (BP Location: Right Arm)   Pulse 80   Temp 98.1 F (36.7 C) (Axillary)   Resp 18   Ht 5' (1.524 m)   Wt 62.7 kg   SpO2 90%   BMI 27.00 kg/m  Pain Scale: 0-10   Pain Score: Asleep   SpO2: SpO2: 90 % O2 Device:SpO2: 90 % O2 Flow Rate: .O2 Flow Rate (L/min): 2 L/min  IO: Intake/output summary: No intake or output data in the 24 hours ending 12/14/22 1241  LBM:   Baseline Weight: Weight: 62.7 kg Most recent weight: Weight: 62.7 kg     Palliative Assessment/Data: PPS 40%     Time In: 1200 Time Out: 1330 Time Total: 90 minutes  Greater than 50%  of this time was spent counseling and coordinating care related to the above assessment and plan.  Signed by: Haskel Khan, NP   Please contact Palliative Medicine Team phone at [phone] for questions and concerns.  For individual provider: See Amion  *Portions of this note are a verbal dictation therefore any spelling and/or grammatical errors are due to the "Dragon [employer] One" system interpretation.

## 2022-12-14 NOTE — Progress Notes (Addendum)
PROGRESS NOTE        PATIENT DETAILS Name: Tara Fields Age: 89 y.o. Sex: female Date of Birth: 08/13/1928 Admit Date: 12/12/2022 Admitting Physician Jonah Blue, MD ZOX:[id number], Kandee Keen, NP  Brief Summary: Patient is a 89 y.o.  female with history of A-fib, chronic HFrEF, COPD, HTN, HLD, dementia who presented with left-sided weakness-was found to have acute CVA and subsequently admitted to the hospitalist service.  Significant events: 4/28>> admit to [facility]  Significant studies: 4/28>> CXR: No PNA-possibly pulm edema 4/28>> x-ray left shoulder: Degenerative changes 4/28>> CT head: No acute intracranial abnormality. 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  Continues to have dense LUE weakness-some mild movement in LLE.  Speech is clear.  Objective: Vitals: Blood pressure 136/63, pulse 62, temperature 98 F (36.7 C), temperature source Axillary, resp. rate 15, height 5' (1.524 m), weight 62.7 kg, SpO2 95 %.   Exam: Gen Exam:Alert awake-not in any distress HEENT:atraumatic, normocephalic Chest: B/L clear to auscultation anteriorly CVS:S1S2 regular Abdomen:soft non tender, non distended Extremities:no edema Neurology: Awake/alert-LUE almost 0/5, LLE some movement approximately 2-3/5. Skin: no rash  Pertinent Labs/Radiology:    Latest Ref Rng & Units 12/12/2022   10:15 PM 12/11/2022    1:44 PM 12/11/2022    1:22 PM  CBC  WBC 4.0 - 10.5 K/uL 6.1   7.3   Hemoglobin 12.0 - 15.0 g/dL 98.1  19.1  47.8   Hematocrit 36.0 - 46.0 % 33.9  35.0  36.5   Platelets 150 - 400 K/uL 172   158     Lab Results  Component Value Date   NA 137 12/12/2022    K 3.7 12/12/2022   CL 100 12/12/2022   CO2 22 12/12/2022      Assessment/Plan: Acute CVA-likely embolic Has dense LUE>> LLE weakness Unfortunately-unable to sit still for an MRI Called son [name] this morning-he is on the way to the hospital-will need to discuss how aggressive we need to be-in order to get an MRI brain/C-spine (neurology concerned about cord compression as well) given her advanced age/frailty. In the interim-continue aspirin-Eliquis on hold-awaiting MRI to determine size of infarct.  Addendum Long discussion with son Randy-explained  this is likely a CVA-however some concern (although unlikely) of C-spine injury.  I explained that to differentiate between these 2 entities-we will need MRI.  Unfortunately even with IV Ativan-patient was unable to lie still-and an MRI will need to be under general anesthesia.  However even if we were to diagnose her with C-spine cord compression/injury-this is ongoing for at least 2-3 days-and decompressive surgery would likely not change overall physical condition.  Furthermore she is currently extremely high risk for surgery.  Patient's son does not want to pursue surgery/MRI under general anesthesia given extensive risks involved.  He wants to mostly focus on pain control-and eventual discharge to SNF.   Chronic A-fib with intermittent RVR Resume metoprolol-Eliquis on hold Telemetry monitoring  Addendum Discussed with Dr. Alonza Smoker Eliquis-add baby aspirin.  Chronic HFrEF Euvolemic  Prolonged QTc Resolved as of EKG 4/30 Telemetry monitoring  HLD Statin  HTN Resume beta-blocker-to control intermittent A-fib Continue  to hold terazosin for now  DM-2 CBG stable-SSI  CKD stage IIIb At baseline  Chronic pain Tramadol  Dementia Seems relatively awake/alert this morning Seroquel/trazodone Delirium precautions  Palliative care  DNR in place Will have brief goals of care conversation patient's son gets here-unable to do  MRI-even with Ativan-May need to be performed in the general anesthesia/sedation.  However-it may not change management  Nutrition Status: Nutrition Problem: Moderate Malnutrition Etiology: chronic illness Signs/Symptoms: severe muscle depletion, mild muscle depletion Interventions: Ensure Enlive (each supplement provides 350kcal and 20 grams of protein), MVI, Liberalize Diet     BMI: Estimated body mass index is 27 kg/m as calculated from the following:   Height as of this encounter: 5' (1.524 m).   Weight as of this encounter: 62.7 kg.   Code status:   Code Status: DNR   DVT Prophylaxis: Place and maintain sequential compression device Start: 12/13/22 1720   Family Communication: Son Randy-over the phone   Disposition Plan: Status is: Inpatient Remains inpatient appropriate because: Severity of illness   Planned Discharge Destination:Skilled nursing facility   Diet: Diet Order             Diet Heart Fluid consistency: Thin  Diet effective ____                     Antimicrobial agents: Anti-infectives (From admission, onward)    None        MEDICATIONS: Scheduled Meds:  aspirin  300 mg Rectal Daily   Or   aspirin  325 mg Oral Daily   atorvastatin  40 mg Oral Daily   lidocaine  1 patch Transdermal Q24H   metoprolol succinate  50 mg Oral BID   QUEtiapine  12.5 mg Oral QHS   traMADol  50 mg Oral TID   Continuous Infusions: PRN Meds:.acetaminophen **OR** acetaminophen (TYLENOL) oral liquid 160 mg/5 mL **OR** acetaminophen, LORazepam, LORazepam, nitroGLYCERIN, ondansetron (ZOFRAN) IV, senna-docusate, traZODone   I have personally reviewed following labs and imaging studies  LABORATORY DATA: CBC: Recent Labs  Lab 12/11/22 1322 12/11/22 1344 12/12/22 2215  WBC 7.3  --  6.1  NEUTROABS  --   --  4.6  HGB 12.2 11.9* 11.1*  HCT 36.5 35.0* 33.9*  MCV 94.1  --  94.2  PLT 158  --  172    Basic Metabolic Panel: Recent Labs  Lab 12/11/22 1322  12/11/22 1344 12/12/22 2215  NA 138 139 137  K 4.4 4.2 3.7  CL 101 104 100  CO2 24  --  22  GLUCOSE 142* 138* 114*  BUN 39* 35* 36*  CREATININE 1.29* 1.30* 1.25*  CALCIUM 9.5  --  9.0    GFR: Estimated Creatinine Clearance: 23.8 mL/min (A) (by C-G formula based on SCr of 1.25 mg/dL (H)).  Liver Function Tests: Recent Labs  Lab 12/11/22 1322  AST 27  ALT 17  ALKPHOS 72  BILITOT 0.9  PROT 6.7  ALBUMIN 3.6   No results for input(s): "LIPASE", "AMYLASE" in the last 168 hours. No results for input(s): "AMMONIA" in the last 168 hours.  Coagulation Profile: Recent Labs  Lab 12/11/22 1322  INR 1.7*    Cardiac Enzymes: Recent Labs  Lab 12/11/22 1322  CKTOTAL 108    BNP (last 3 results) No results for input(s): "PROBNP" in the last 8760 hours.  Lipid Profile: Recent Labs    12/13/22 1219  CHOL 105  HDL 32*  LDLCALC 54  TRIG 97  CHOLHDL 3.3  Thyroid Function Tests: No results for input(s): "TSH", "T4TOTAL", "FREET4", "T3FREE", "THYROIDAB" in the last 72 hours.  Anemia Panel: No results for input(s): "VITAMINB12", "FOLATE", "FERRITIN", "TIBC", "IRON", "RETICCTPCT" in the last 72 hours.  Urine analysis:    Component Value Date/Time   COLORURINE YELLOW 11/26/2022 1415   APPEARANCEUR CLEAR 11/26/2022 1415   LABSPEC 1.009 11/26/2022 1415   PHURINE 8.0 11/26/2022 1415   GLUCOSEU NEGATIVE 11/26/2022 1415   GLUCOSEU NEGATIVE 12/11/2019 1033   HGBUR MODERATE (A) 11/26/2022 1415   BILIRUBINUR NEGATIVE 11/26/2022 1415   BILIRUBINUR neg 06/22/2022 1402   KETONESUR NEGATIVE 11/26/2022 1415   PROTEINUR NEGATIVE 11/26/2022 1415   UROBILINOGEN 0.2 06/22/2022 1402   UROBILINOGEN 0.2 12/11/2019 1033   NITRITE NEGATIVE 11/26/2022 1415   LEUKOCYTESUR MODERATE (A) 11/26/2022 1415    Sepsis Labs: Lactic Acid, Venous    Component Value Date/Time   LATICACIDVEN 1.2 12/11/2022 1305    MICROBIOLOGY: No results found for this or any previous visit (from the  past 240 hour(s)).  RADIOLOGY STUDIES/RESULTS: CT ANGIO HEAD NECK W WO CM  Result Date: 12/14/2022 CLINICAL DATA:  89 year old female with neurologic deficit. Motion degraded MRI yesterday suspicious for posterior right frontal, parietal region acute infarct. EXAM: CT ANGIOGRAPHY HEAD AND NECK WITH AND WITHOUT CONTRAST TECHNIQUE: Multidetector CT imaging of the head and neck was performed using the standard protocol during bolus administration of intravenous contrast. Multiplanar CT image reconstructions and MIPs were obtained to evaluate the vascular anatomy. Carotid stenosis measurements (when applicable) are obtained utilizing NASCET criteria, using the distal internal carotid diameter as the denominator. RADIATION DOSE REDUCTION: This exam was performed according to the departmental dose-optimization program which includes automated exposure control, adjustment of the mA and/or kV according to patient size and/or use of iterative reconstruction technique. CONTRAST:  75mL OMNIPAQUE IOHEXOL 350 MG/ML SOLN COMPARISON:  Brain MRI yesterday.  Head CT 12/12/2022. FINDINGS: CT HEAD Brain: Calcified atherosclerosis at the skull base. No midline shift, mass effect, or evidence of intracranial mass lesion. No ventriculomegaly No acute intracranial hemorrhage identified. Mild motion artifact despite repeated imaging attempts but there is mild asymmetric hypodensity in the groove posterosuperior right frontal lobe series 16, image 12 which is concordant with recent infarct there. Grossly stable gray-white differentiation otherwise. Calvarium and skull base: Mild motion artifact. Hyperostosis of the skull. No acute osseous abnormality identified. Paranasal sinuses: Visualized paranasal sinuses and mastoids are stable and well aerated. Orbits: Broad-based posterior vertex scalp hematoma may be mildly progressed on series 6, image 68. Orbits soft tissues appears stable and negative. CTA NECK Skeleton: Absent dentition.  Advanced cervical spine degeneration, including C1-C2. No acute osseous abnormality identified. Upper chest: Centrilobular emphysema with superimposed pulmonary septal thickening bilaterally. Partially visible layering pleural effusions, larger on the left with simple fluid density on that side. Visible left main pulmonary artery appears patent. No superior mediastinal lymphadenopathy. Other neck: No acute finding. Aortic arch: Extensive Calcified aortic atherosclerosis. 3 vessel arch. Right carotid system: Moderate proximal brachiocephalic artery calcified plaque with up to 50% stenosis. Mild plaque at the right CCA origin. Occluded right CCA she proximal to the bifurcation, with contrast enhancement of the vessel gradually terminating by the level of the larynx, glottis. Bulky calcified atherosclerosis at the right carotid bifurcation. However, both the proximal right ECA and ICA are reconstituted. The right ICA is diminutive but patent to the skull base. Left carotid system: Moderate left CCA origin calcified plaque with less than 50% stenosis. Intermittent calcified plaque before the left carotid  bifurcation is not hemodynamically significant. Bulky calcified plaque at the bifurcation primarily affects the ICA origin. Estimated stenosis is 64 % with respect to the distal vessel. See series 19, image 115. The left ICA remains patent with mild tortuosity below the skull base. Mild calcified plaque also at the left ICA bulb. Vertebral arteries: Mild proximal right subclavian artery calcified plaque without stenosis. Right vertebral artery origin remains normal. Tortuous right V1 segment. Mild right V2 segment calcified plaque with no significant stenosis to the skull base. Proximal left subclavian artery calcified plaque is initially moderate, but bulky beginning about 12 mm distal to the origin and high-grade stenosis is demonstrated on series 9, image 265, numerically estimated at 80 % with respect to the distal  vessel. The left subclavian remains patent and despite adjacent calcified plaque the left vertebral artery origin is patent without stenosis on series 19, image 153. Left vertebral artery is codominant and patent to the skull base with mild V3 segment calcified plaque. CTA HEAD Posterior circulation: Proximal V4 segment calcified plaque bilaterally with only mild stenosis which is greater on the left. Tortuous vertebrobasilar junction is patent without stenosis. Normal right PICA origin. Left AICA may be dominant. Patent basilar artery, diminutive. Fetal type left PCA origin. Patent SCA and PCA origins. Right posterior communicating artery is diminutive or absent. Left PCA branches are within normal limits. There is calcified plaque of the right PCA P3 with moderate stenosis there. Anterior circulation: Right ICA siphon is patent although with asymmetrically decreased enhancement compared to the left. Moderate superimposed right siphon calcified plaque and mild to moderate right ICA supraclinoid stenosis. The right ICA terminus is patent although asymmetrically decreased enhancement is noted there. Right MCA and ACA origins are patent. Contralateral left ICA siphon is patent with intense enhancement. Moderate left siphon calcified plaque but only mild left siphon stenosis. Normal left posterior communicating artery origin. Normal left ICA terminus, left MCA and ACA origins. Left ACA enhancement is dominant throughout. Diminutive or absent anterior communicating artery. Right ACA appears patent although with less enhancement. Left MCA M1 segment and bifurcation are patent without stenosis. Left MCA branches are within normal limits. Right MCA M1 remains patent although with asymmetrically decreased enhancement throughout. Right MCA bifurcation is patent. Right MCA M2 and M3 branches are not well evaluated due to decreased enhancement. Venous sinuses: Early contrast timing, not well evaluated. Anatomic variants: Fetal  type left PCA origin. Review of the MIP images confirms the above findings IMPRESSION: 1. 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 Reconstituted right ICA and ECA despite superimposed bulky calcified plaque throughout the right carotid bifurcation. Asymmetrically diminished Right ICA and right Anterior circulation enhancement, such that the right M2 and distal MCA branches are not well evaluated. 2. Widespread bulky calcified atherosclerosis elsewhere from the upper chest to the skull base, also notable for: - up to Moderate superimposed calcified plaque of both ICA siphons. - Bulky calcified plaque at the Left ICA origin with estimated 64% stenosis. - High-grade stenosis of the Left Subclavian Artery, estimated 80% stenosis. - calcified plaque right PCA P3. 3. Partially visible layering pleural effusions, larger on the left. Pulmonary septal thickening compatible with Pulmonary Edema. Aortic Atherosclerosis (ICD10-I70.0) and Emphysema (ICD10-J43.9). 4. Broad-based posterior vertex scalp hematoma may be mildly progressed since yesterday Study discussed by telephone with Dr. Rollene Fare on 12/14/2022 at 06:20 . Electronically Signed   By: Odessa Fleming M.D.   On: 12/14/2022 06:29   EEG adult  Result Date:  12/13/2022 Charlsie Quest, MD     12/13/2022 11:15 AM Patient Name: Rupa Lagan MRN: [id number] Epilepsy Attending: Charlsie Quest Referring Physician/Provider: Gordy Councilman, MD Date: 12/13/2022 Duration: 23.34 mins Patient history: 89 year old female with left frontal stroke getting EEG to evaluate for seizure. Level of alertness: Awake AEDs during EEG study: None Technical aspects: This EEG study was done with scalp electrodes positioned according to the 10-20 International system of electrode placement. Electrical activity was reviewed with band pass filter of 1-70Hz , sensitivity of 7 uV/mm, display speed of 46mm/sec with a 60Hz  notched filter applied as appropriate. EEG data were recorded  continuously and digitally stored.  Video monitoring was available and reviewed as appropriate. Description: The posterior dominant rhythm consists of 7.5 Hz activity of moderate voltage (25-35 uV) seen predominantly in posterior head regions, symmetric and reactive to eye opening and eye closing. EEG showed intermittent generalized 3 to 6 Hz theta-delta slowing. Hyperventilation and photic stimulation were not performed.   ABNORMALITY - Intermittent slow, generalized IMPRESSION: This study is suggestive of mild diffuse encephalopathy. No seizures or epileptiform discharges were seen throughout the recording. Charlsie Quest   MR BRAIN WO CONTRAST  Result Date: 12/13/2022 CLINICAL DATA:  Stroke workup EXAM: MRI HEAD WITHOUT CONTRAST TECHNIQUE: Multiplanar, multiecho pulse sequences of the brain and surrounding structures were obtained without intravenous contrast. COMPARISON:  Head CT from yesterday FINDINGS: Only diffusion imaging was acquired, and very much motion degraded, due to patient condition despite sedative medication. Especially on coronal acquisition small areas of cortex and white matter infarct or suspected at high right frontal lobe. No hydrocephalus or gross midline shift. IMPRESSION: Very truncated and motion degraded study. Small acute infarct is still suspected at the high right frontal parietal junction. Electronically Signed   By: Tiburcio Pea M.D.   On: 12/13/2022 05:10   CT Head Wo Contrast  Result Date: 12/12/2022 CLINICAL DATA:  Initial evaluation for neuro deficit, stroke suspected. EXAM: CT HEAD WITHOUT CONTRAST TECHNIQUE: Contiguous axial images were obtained from the base of the skull through the vertex without intravenous contrast. RADIATION DOSE REDUCTION: This exam was performed according to the departmental dose-optimization program which includes automated exposure control, adjustment of the mA and/or kV according to patient size and/or use of iterative reconstruction  technique. COMPARISON:  Prior study from 12/11/2022. FINDINGS: Brain: Age-related cerebral volume loss with chronic small vessel ischemic disease. No acute intracranial hemorrhage. No acute large vessel territory infarct. No mass lesion or midline shift. No hydrocephalus or extra-axial fluid collection. Prominent dural base calcifications noted about the falx. Vascular: No abnormal hyperdense vessel. Calcified atherosclerosis present at the skull base. Skull: Small left parietal scalp contusion. Calvarium intact. Hyperostosis frontalis interna noted. Sinuses/Orbits: Globes orbital soft tissues within normal limits. Small volume pneumatized secretions noted within the left sphenoid sinus. Paranasal sinuses are otherwise clear. No mastoid effusion. Other: None. IMPRESSION: 1. No acute intracranial abnormality. 2. Small left parietal scalp contusion. No calvarial fracture. 3. Age-related cerebral volume loss with chronic small vessel ischemic disease. Electronically Signed   By: Rise Mu M.D.   On: 12/12/2022 22:46   DG Chest Port 1 View  Result Date: 12/12/2022 CLINICAL DATA:  Left arm weakness.  History of lung cancer. EXAM: PORTABLE CHEST 1 VIEW COMPARISON:  Chest x-ray 12/11/2022.  CT chest 12/11/2022. FINDINGS: Heart is enlarged. Patient is status post TAVR. There are patchy airspace and central interstitial opacities bilaterally. There is a band of opacity in the left upper lobe appears  unchanged from most recent CT. No pleural effusion or pneumothorax. No acute fractures are seen. IMPRESSION: 1. Cardiomegaly with patchy airspace and central interstitial opacities bilaterally, likely pulmonary edema. 2. Band of opacity in the left upper lobe appears unchanged from most recent CT. Electronically Signed   By: Darliss Cheney M.D.   On: 12/12/2022 21:48   DG Shoulder Left  Result Date: 12/12/2022 CLINICAL DATA:  Weakness EXAM: LEFT SHOULDER - 2+ VIEW COMPARISON:  None Available. FINDINGS:  Degenerative changes of the acromioclavicular joint and glenohumeral joint are seen. No acute fracture or dislocation is noted. Linear density is noted similar to that seen on recent chest CT consistent with post radiation change. No underlying bony abnormality is noted. IMPRESSION: Degenerative change without acute abnormality. Electronically Signed   By: Alcide Clever M.D.   On: 12/12/2022 21:47     LOS: 1 day   Jeoffrey Massed, MD  [facility]    To contact the attending provider between 7A-7P or the covering provider during after hours 7P-7A, please log into the web site [email or web address] and access using universal [place] password for that web site. If you do not have the password, please call the hospital operator.  12/14/2022, 10:13 AM

## 2022-12-15 DIAGNOSIS — N1832 Chronic kidney disease, stage 3b: Secondary | ICD-10-CM | POA: Diagnosis not present

## 2022-12-15 DIAGNOSIS — I10 Essential (primary) hypertension: Secondary | ICD-10-CM | POA: Diagnosis not present

## 2022-12-15 DIAGNOSIS — I4819 Other persistent atrial fibrillation: Secondary | ICD-10-CM | POA: Diagnosis not present

## 2022-12-15 DIAGNOSIS — I639 Cerebral infarction, unspecified: Secondary | ICD-10-CM | POA: Diagnosis not present

## 2022-12-15 LAB — GLUCOSE, CAPILLARY
Glucose-Capillary: 132 mg/dL — ABNORMAL HIGH (ref 70–99)
Glucose-Capillary: 105 mg/dL — ABNORMAL HIGH (ref 70–99)
Glucose-Capillary: 129 mg/dL — ABNORMAL HIGH (ref 70–99)

## 2022-12-15 MED ORDER — INSULIN ASPART 100 UNIT/ML IJ SOLN
0.0000 [IU] | Freq: Three times a day (TID) | INTRAMUSCULAR | Status: DC
  Administered 2022-12-15: 0 [IU] via SUBCUTANEOUS
  Administered 2022-12-16: 2 [IU] via SUBCUTANEOUS

## 2022-12-15 NOTE — TOC Progression Note (Addendum)
Transition of Care [facility]) - Progression Note    Patient Details  Name: Tara Fields MRN: [id number] Date of Birth: 04-24-1928  [facility] CM/SW Contact  Mearl Latin, LCSW Phone Number: 12/15/2022, 2:28 PM  Clinical Narrative:    2pm-CSW spoke with patient's son to obtain SNF choice. He is requesting [facility]. [facility] has not offered a bed so CSW awaiting response and encouraged him to have a second option. He will let CSW know and he expressed concern that patient is lethargic and not eating. He stated when he was feeding her eggs this morning, she spit them out. Will pass concerns on to medical team.   4pm-CSW spoke with [facility] and they can accept patient tomorrow afternoon if medically stable. They requested patient bring in Eliquis. CSW spoke with patient's son and he will bring it in from home.   Expected Discharge Plan: Skilled Nursing Facility Barriers to Discharge: Continued Medical Work up, SNF Pending bed offer  Expected Discharge Plan and Services In-house Referral: Clinical Social Work   Post Acute Care Choice: Skilled Nursing Facility Living arrangements for the past 2 months: Independent Living Facility                                       Social Determinants of Health (SDOH) Interventions SDOH Screenings   Food Insecurity: No Food Insecurity (09/09/2022)  Housing: Low Risk  (09/09/2022)  Transportation Needs: No Transportation Needs (09/09/2022)  Utilities: Not At Risk (09/09/2022)  Alcohol Screen: Low Risk  (03/17/2021)  Depression (PHQ2-9): High Risk (09/14/2022)  Financial Resource Strain: Low Risk  (08/14/2022)  Physical Activity: Unknown (08/14/2022)  Social Connections: Socially Isolated (08/14/2022)  Stress: No Stress Concern Present (08/14/2022)  Tobacco Use: Medium Risk (12/13/2022)    Readmission Risk Interventions    12/14/2022    3:00 PM 11/09/2021    2:38 PM 09/04/2021    2:20 PM  Readmission Risk Prevention Plan   Transportation Screening Complete Complete Complete  PCP or Specialist Appt within 3-5 Days   Complete  HRI or Home Care Consult   Complete  Social Work Consult for Recovery Care Planning/Counseling   Complete  Palliative Care Screening   Not Applicable  Medication Review Oceanographer) Complete Complete Complete  PCP or Specialist appointment within 3-5 days of discharge Complete Complete   HRI or Home Care Consult Complete Complete   SW Recovery Care/Counseling Consult Complete Complete   Palliative Care Screening Complete Not Applicable   Skilled Nursing Facility Complete Not Applicable

## 2022-12-15 NOTE — Progress Notes (Signed)
Physical Therapy Treatment Patient Details Name: Tara Fields MRN: [id number] DOB: 1928/02/05 Today's Date: 12/15/2022   History of Present Illness 89 y.o. female presents to [facility] hospital with L weakness. 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 PMH: chronic systolic CHF EF 20-25%, h/o TAVr, PAfib on eliquis, COPD, CAD, CKD 3a, aortic stenosis s/p TAVR    PT Comments    Pt continues to be limited by lethargy but assisting in supine>sit transfer and tolerating sitting EOB ~3 minutes. Pt slightly more awake while seated EOB but after ~3 minutes pt noted to be dozing off, requiring maxA to assist back into bed. Attempted to get pt to participate in reaching or BLE exercises while seated EOB but pt not following. Pt utilizing RUE for support with close guard for safety. Pt intermittently stating "my back" but not specifying if pain, when back scratched pt not reporting anything but continues later and once supine. Pt repositioned in bed and dozing back to sleep. Acute PT will continue to follow to progress mobility and discharge recommendations remain appropriate.    Recommendations for follow up therapy are one component of a multi-disciplinary discharge planning process, led by the attending physician.  Recommendations may be updated based on patient status, additional functional criteria and insurance authorization.  Follow Up Recommendations  Can patient physically be transported by private vehicle: No    Assistance Recommended at Discharge Frequent or constant Supervision/Assistance  Patient can return home with the following Two people to help with walking and/or transfers;Two people to help with bathing/dressing/bathroom;Assistance with feeding;Assistance with cooking/housework;Direct supervision/assist for medications management;Direct supervision/assist for financial management;Assist for transportation;Help with stairs or ramp for entrance    Equipment Recommendations  Wheelchair (measurements PT);Hospital bed    Recommendations for Other Services       Precautions / Restrictions Precautions Precautions: Fall Restrictions Weight Bearing Restrictions: No     Mobility  Bed Mobility Overal bed mobility: Needs Assistance Bed Mobility: Supine to Sit, Sit to Supine     Supine to sit: Max assist, HOB elevated Sit to supine: Max assist   General bed mobility comments: maxA for BLE management and trunk support for supine<>sit. Pt pulling trunk up into sitting with RUE but requiring assist    Transfers Overall transfer level: Needs assistance                 General transfer comment: unable to attempt    Ambulation/Gait               General Gait Details: unable to attempt   Stairs             Wheelchair Mobility    Modified Rankin (Stroke Patients Only) Modified Rankin (Stroke Patients Only) Pre-Morbid Rankin Score: Moderate disability Modified Rankin: Severe disability     Balance Overall balance assessment: Needs assistance Sitting-balance support: Single extremity supported, Feet supported Sitting balance-Leahy Scale: Fair Sitting balance - Comments: close guard with intermittent minA for balance, R lateral lean utilizing RUE for support Postural control: Right lateral lean                                  Cognition Arousal/Alertness: Lethargic Behavior During Therapy: Flat affect Overall Cognitive Status: History of cognitive impairments - at baseline Area of Impairment: Attention, Memory, Following commands, Safety/judgement, Problem solving  Current Attention Level: Sustained Memory: Decreased recall of precautions, Decreased short-term memory Following Commands: Follows one step commands inconsistently Safety/Judgement: Decreased awareness of safety, Decreased awareness of deficits   Problem Solving: Slow processing, Decreased  initiation, Difficulty sequencing, Requires verbal cues General Comments: sleeping upon arrival but awakening to verbal stimulation. Increased time for command following and drowsy while seated EOB. Pt perseverating on her back, sometimes reporting pain but also enjoying back scratch        Exercises      General Comments General comments (skin integrity, edema, etc.): VSS on 2L O2 [place]      Pertinent Vitals/Pain Pain Assessment Pain Assessment: Faces Faces Pain Scale: Hurts little more Pain Location: back Pain Descriptors / Indicators: Guarding, Grimacing Pain Intervention(s): Limited activity within patient's tolerance, Monitored during session, Repositioned    Home Living                          Prior Function            PT Goals (current goals can now be found in the care plan section) Acute Rehab PT Goals Patient Stated Goal: to reduce falls risk PT Goal Formulation: Patient unable to participate in goal setting Time For Goal Achievement: 12/27/22 Potential to Achieve Goals: Fair Progress towards PT goals: Progressing toward goals    Frequency    Min 3X/week      PT Plan Current plan remains appropriate    Co-evaluation              AM-PAC PT "6 Clicks" Mobility   Outcome Measure  Help needed turning from your back to your side while in a flat bed without using bedrails?: A Lot Help needed moving from lying on your back to sitting on the side of a flat bed without using bedrails?: A Lot Help needed moving to and from a bed to a chair (including a wheelchair)?: Total Help needed standing up from a chair using your arms (e.g., wheelchair or bedside chair)?: Total Help needed to walk in hospital room?: Total Help needed climbing 3-5 steps with a railing? : Total 6 Click Score: 8    End of Session Equipment Utilized During Treatment: Oxygen Activity Tolerance: Patient limited by lethargy Patient left: in bed;with call bell/phone within  reach;with bed alarm set;with SCD's reapplied Nurse Communication: Mobility status PT Visit Diagnosis: Other abnormalities of gait and mobility (R26.89);Muscle weakness (generalized) (M62.81);Difficulty in walking, not elsewhere classified (R26.2)     Time: 1610-9604 PT Time Calculation (min) (ACUTE ONLY): 14 min  Charges:  $Therapeutic Activity: 8-22 mins                     Lindalou Hose, PT DPT Acute Rehabilitation Services Office [phone]    Leonie Man 12/15/2022, 4:33 PM

## 2022-12-15 NOTE — Progress Notes (Signed)
Pharmacist Heart Failure Core Measure Documentation  Assessment: Tara Fields has an EF documented as 20-25% on 10/27/22 by ECHO.  Rationale: Heart failure patients with left ventricular systolic dysfunction (LVSD) and an EF < 40% should be prescribed an angiotensin converting enzyme inhibitor (ACEI) or angiotensin receptor blocker (ARB) at discharge unless a contraindication is documented in the medical record.  This patient is not currently on an ACEI or ARB for HF.  This note is being placed in the record in order to provide documentation that a contraindication to the use of these agents is present for this encounter.  ACE Inhibitor or Angiotensin Receptor Blocker is contraindicated (specify all that apply)  []   ACEI allergy AND ARB allergy []   Angioedema []   Moderate or severe aortic stenosis []   Hyperkalemia []   Hypotension []   Renal artery stenosis [x]   Worsening renal function, preexisting renal disease or dysfunction

## 2022-12-15 NOTE — Progress Notes (Signed)
PROGRESS NOTE        PATIENT DETAILS Name: Tara Fields Age: 89 y.o. Sex: female Date of Birth: [birth year] Admit Date: 12/12/2022 Admitting Physician Jonah Blue, MD ZOX:[id number], Kandee Keen, NP  Brief Summary: Patient is a 89 y.o.  female with history of A-fib, chronic HFrEF, COPD, HTN, HLD, dementia who presented with left-sided weakness-was found to have acute CVA and subsequently admitted to the hospitalist service.  Significant events: 4/28>> admit to [facility]  Significant studies: 4/28>> CXR: No PNA-possibly pulm edema 4/28>> x-ray left shoulder: Degenerative changes 4/28>> CT head: No acute intracranial abnormality. 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    Objective: Vitals: Blood pressure 128/66, pulse 68, temperature 97.7 F (36.5 C), temperature source Oral, resp. rate 19, height 5' (1.524 m), weight 62.7 kg, SpO2 93 %.   Exam: Gen Exam:Alert awake-not in any distress HEENT:atraumatic, normocephalic Chest: B/L clear to auscultation anteriorly CVS:S1S2 regular Abdomen:soft non tender, non distended Extremities:no edema Neurology: LUE-0/5, LLE 2-3/5 Skin: no rash  Pertinent Labs/Radiology:    Latest Ref Rng & Units 12/12/2022   10:15 PM 12/11/2022    1:44 PM 12/11/2022    1:22 PM  CBC  WBC 4.0 - 10.5 K/uL 6.1   7.3   Hemoglobin 12.0 - 15.0 g/dL 98.1  19.1  47.8   Hematocrit 36.0 - 46.0 % 33.9  35.0  36.5   Platelets 150 - 400 K/uL 172   158     Lab Results  Component Value Date   NA 137 12/12/2022   K 3.7 12/12/2022   CL 100 12/12/2022   CO2 22 12/12/2022      Assessment/Plan: Acute  CVA-likely embolic Has dense LUE>> LLE weakness Unfortunately-unable to sit still for an MRI Extensive discussion by this MD-and neurology with son Randy-no further imaging to be pursued (see note on 4/30)-as it will not change management.  Started on Eliquis in addition to aspirin per recommendation from stroke MD. Plans are for SNF on discharge.  Chronic A-fib with intermittent RVR Continue metoprolol/Eliquis.  Chronic HFrEF Euvolemic  Prolonged QTc Resolved as of EKG 4/30 Telemetry monitoring  HLD Statin  HTN BP stable with beta-blocker Continue to hold terazosin for now.   DM-2 CBG stable-SSI  CKD stage IIIb At baseline  Chronic pain Continue scheduled tramadol On Norco for moderate breakthrough pain, fentanyl for severe breakthrough pain  Dementia Seems relatively awake/alert this morning Seroquel/trazodone Delirium precautions  Palliative care  DNR in place Extensive discussion with patient's son [name] on 4/30-understands tenuous situation-please see my note on 4/30-goals of care-gentle medical treatment-no aggressive care desired.  Palliative care also following.  Plans are for SNF on discharge.    Nutrition Status: Nutrition Problem: Moderate Malnutrition Etiology: chronic illness Signs/Symptoms: severe muscle depletion, mild muscle depletion Interventions: Ensure Enlive (each supplement provides 350kcal and 20 grams of protein), MVI, Liberalize Diet    BMI: Estimated body mass index is 27 kg/m as calculated from the following:   Height as of this encounter: 5' (1.524 m).   Weight as of this encounter: 62.7 kg.   Code status:  Code Status: DNR   DVT Prophylaxis: apixaban (ELIQUIS) tablet 2.5 mg Start: 12/14/22 1430 Place and maintain sequential compression device Start: 12/13/22 1720 apixaban (ELIQUIS) tablet 2.5 mg    Family Communication: Son Randy-over the phone 4/30   Disposition Plan: Status is: Inpatient Remains inpatient appropriate  because: Severity of illness   Planned Discharge Destination:Skilled nursing facility   Diet: Diet Order             Diet regular Room service appropriate? Yes with Assist; Fluid consistency: Thin  Diet effective now                     Antimicrobial agents: Anti-infectives (From admission, onward)    None        MEDICATIONS: Scheduled Meds:  acetaminophen  500 mg Oral TID   apixaban  2.5 mg Oral BID   aspirin EC  81 mg Oral Daily   atorvastatin  40 mg Oral Daily   feeding supplement  237 mL Oral BID BM   lidocaine  1 patch Transdermal Q24H   metoprolol succinate  50 mg Oral BID   multivitamin with minerals  1 tablet Oral Daily   pantoprazole  40 mg Oral Q1200   QUEtiapine  12.5 mg Oral QHS   traMADol  50 mg Oral TID   Continuous Infusions: PRN Meds:.acetaminophen **OR** acetaminophen (TYLENOL) oral liquid 160 mg/5 mL **OR** acetaminophen, fentaNYL (SUBLIMAZE) injection, HYDROcodone-acetaminophen, LORazepam, LORazepam, nitroGLYCERIN, ondansetron (ZOFRAN) IV, senna-docusate, traZODone   I have personally reviewed following labs and imaging studies  LABORATORY DATA: CBC: Recent Labs  Lab 12/11/22 1322 12/11/22 1344 12/12/22 2215  WBC 7.3  --  6.1  NEUTROABS  --   --  4.6  HGB 12.2 11.9* 11.1*  HCT 36.5 35.0* 33.9*  MCV 94.1  --  94.2  PLT 158  --  172     Basic Metabolic Panel: Recent Labs  Lab 12/11/22 1322 12/11/22 1344 12/12/22 2215  NA 138 139 137  K 4.4 4.2 3.7  CL 101 104 100  CO2 24  --  22  GLUCOSE 142* 138* 114*  BUN 39* 35* 36*  CREATININE 1.29* 1.30* 1.25*  CALCIUM 9.5  --  9.0     GFR: Estimated Creatinine Clearance: 23.8 mL/min (A) (by C-G formula based on SCr of 1.25 mg/dL (H)).  Liver Function Tests: Recent Labs  Lab 12/11/22 1322  AST 27  ALT 17  ALKPHOS 72  BILITOT 0.9  PROT 6.7  ALBUMIN 3.6    No results for input(s): "LIPASE", "AMYLASE" in the last 168 hours. No results for input(s): "AMMONIA" in the  last 168 hours.  Coagulation Profile: Recent Labs  Lab 12/11/22 1322  INR 1.7*     Cardiac Enzymes: Recent Labs  Lab 12/11/22 1322  CKTOTAL 108     BNP (last 3 results) No results for input(s): "PROBNP" in the last 8760 hours.  Lipid Profile: Recent Labs    12/13/22 1219  CHOL 105  HDL 32*  LDLCALC 54  TRIG 97  CHOLHDL 3.3     Thyroid Function Tests: No results for input(s): "TSH", "T4TOTAL", "FREET4", "T3FREE", "THYROIDAB" in the last 72 hours.  Anemia Panel: No results for input(s): "VITAMINB12", "FOLATE", "FERRITIN", "TIBC", "IRON", "RETICCTPCT" in the last 72 hours.  Urine analysis:    Component Value Date/Time   COLORURINE YELLOW 11/26/2022 1415   APPEARANCEUR CLEAR 11/26/2022 1415   LABSPEC 1.009 11/26/2022 1415   PHURINE 8.0 11/26/2022 1415   GLUCOSEU NEGATIVE  11/26/2022 1415   GLUCOSEU NEGATIVE 12/11/2019 1033   HGBUR MODERATE (A) 11/26/2022 1415   BILIRUBINUR NEGATIVE 11/26/2022 1415   BILIRUBINUR neg 06/22/2022 1402   KETONESUR NEGATIVE 11/26/2022 1415   PROTEINUR NEGATIVE 11/26/2022 1415   UROBILINOGEN 0.2 06/22/2022 1402   UROBILINOGEN 0.2 12/11/2019 1033   NITRITE NEGATIVE 11/26/2022 1415   LEUKOCYTESUR MODERATE (A) 11/26/2022 1415    Sepsis Labs: Lactic Acid, Venous    Component Value Date/Time   LATICACIDVEN 1.2 12/11/2022 1305    MICROBIOLOGY: No results found for this or any previous visit (from the past 240 hour(s)).  RADIOLOGY STUDIES/RESULTS: CT ANGIO HEAD NECK W WO CM  Result Date: 12/14/2022 CLINICAL DATA:  89 year old female with neurologic deficit. Motion degraded MRI yesterday suspicious for posterior right frontal, parietal region acute infarct. EXAM: CT ANGIOGRAPHY HEAD AND NECK WITH AND WITHOUT CONTRAST TECHNIQUE: Multidetector CT imaging of the head and neck was performed using the standard protocol during bolus administration of intravenous contrast. Multiplanar CT image reconstructions and MIPs were obtained to  evaluate the vascular anatomy. Carotid stenosis measurements (when applicable) are obtained utilizing NASCET criteria, using the distal internal carotid diameter as the denominator. RADIATION DOSE REDUCTION: This exam was performed according to the departmental dose-optimization program which includes automated exposure control, adjustment of the mA and/or kV according to patient size and/or use of iterative reconstruction technique. CONTRAST:  75mL OMNIPAQUE IOHEXOL 350 MG/ML SOLN COMPARISON:  Brain MRI yesterday.  Head CT 12/12/2022. FINDINGS: CT HEAD Brain: Calcified atherosclerosis at the skull base. No midline shift, mass effect, or evidence of intracranial mass lesion. No ventriculomegaly No acute intracranial hemorrhage identified. Mild motion artifact despite repeated imaging attempts but there is mild asymmetric hypodensity in the groove posterosuperior right frontal lobe series 16, image 12 which is concordant with recent infarct there. Grossly stable gray-white differentiation otherwise. Calvarium and skull base: Mild motion artifact. Hyperostosis of the skull. No acute osseous abnormality identified. Paranasal sinuses: Visualized paranasal sinuses and mastoids are stable and well aerated. Orbits: Broad-based posterior vertex scalp hematoma may be mildly progressed on series 6, image 68. Orbits soft tissues appears stable and negative. CTA NECK Skeleton: Absent dentition. Advanced cervical spine degeneration, including C1-C2. No acute osseous abnormality identified. Upper chest: Centrilobular emphysema with superimposed pulmonary septal thickening bilaterally. Partially visible layering pleural effusions, larger on the left with simple fluid density on that side. Visible left main pulmonary artery appears patent. No superior mediastinal lymphadenopathy. Other neck: No acute finding. Aortic arch: Extensive Calcified aortic atherosclerosis. 3 vessel arch. Right carotid system: Moderate proximal  brachiocephalic artery calcified plaque with up to 50% stenosis. Mild plaque at the right CCA origin. Occluded right CCA she proximal to the bifurcation, with contrast enhancement of the vessel gradually terminating by the level of the larynx, glottis. Bulky calcified atherosclerosis at the right carotid bifurcation. However, both the proximal right ECA and ICA are reconstituted. The right ICA is diminutive but patent to the skull base. Left carotid system: Moderate left CCA origin calcified plaque with less than 50% stenosis. Intermittent calcified plaque before the left carotid bifurcation is not hemodynamically significant. Bulky calcified plaque at the bifurcation primarily affects the ICA origin. Estimated stenosis is 64 % with respect to the distal vessel. See series 19, image 115. The left ICA remains patent with mild tortuosity below the skull base. Mild calcified plaque also at the left ICA bulb. Vertebral arteries: Mild proximal right subclavian artery calcified plaque without stenosis. Right vertebral artery origin remains normal. Tortuous right  V1 segment. Mild right V2 segment calcified plaque with no significant stenosis to the skull base. Proximal left subclavian artery calcified plaque is initially moderate, but bulky beginning about 12 mm distal to the origin and high-grade stenosis is demonstrated on series 9, image 265, numerically estimated at 80 % with respect to the distal vessel. The left subclavian remains patent and despite adjacent calcified plaque the left vertebral artery origin is patent without stenosis on series 19, image 153. Left vertebral artery is codominant and patent to the skull base with mild V3 segment calcified plaque. CTA HEAD Posterior circulation: Proximal V4 segment calcified plaque bilaterally with only mild stenosis which is greater on the left. Tortuous vertebrobasilar junction is patent without stenosis. Normal right PICA origin. Left AICA may be dominant. Patent  basilar artery, diminutive. Fetal type left PCA origin. Patent SCA and PCA origins. Right posterior communicating artery is diminutive or absent. Left PCA branches are within normal limits. There is calcified plaque of the right PCA P3 with moderate stenosis there. Anterior circulation: Right ICA siphon is patent although with asymmetrically decreased enhancement compared to the left. Moderate superimposed right siphon calcified plaque and mild to moderate right ICA supraclinoid stenosis. The right ICA terminus is patent although asymmetrically decreased enhancement is noted there. Right MCA and ACA origins are patent. Contralateral left ICA siphon is patent with intense enhancement. Moderate left siphon calcified plaque but only mild left siphon stenosis. Normal left posterior communicating artery origin. Normal left ICA terminus, left MCA and ACA origins. Left ACA enhancement is dominant throughout. Diminutive or absent anterior communicating artery. Right ACA appears patent although with less enhancement. Left MCA M1 segment and bifurcation are patent without stenosis. Left MCA branches are within normal limits. Right MCA M1 remains patent although with asymmetrically decreased enhancement throughout. Right MCA bifurcation is patent. Right MCA M2 and M3 branches are not well evaluated due to decreased enhancement. Venous sinuses: Early contrast timing, not well evaluated. Anatomic variants: Fetal type left PCA origin. Review of the MIP images confirms the above findings IMPRESSION: 1. 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 Reconstituted right ICA and ECA despite superimposed bulky calcified plaque throughout the right carotid bifurcation. Asymmetrically diminished Right ICA and right Anterior circulation enhancement, such that the right M2 and distal MCA branches are not well evaluated. 2. Widespread bulky calcified atherosclerosis elsewhere from the upper chest to the skull base, also notable for: -  up to Moderate superimposed calcified plaque of both ICA siphons. - Bulky calcified plaque at the Left ICA origin with estimated 64% stenosis. - High-grade stenosis of the Left Subclavian Artery, estimated 80% stenosis. - calcified plaque right PCA P3. 3. Partially visible layering pleural effusions, larger on the left. Pulmonary septal thickening compatible with Pulmonary Edema. Aortic Atherosclerosis (ICD10-I70.0) and Emphysema (ICD10-J43.9). 4. Broad-based posterior vertex scalp hematoma may be mildly progressed since yesterday Study discussed by telephone with Dr. Rollene Fare on 12/14/2022 at 06:20 . Electronically Signed   By: Odessa Fleming M.D.   On: 12/14/2022 06:29     LOS: 2 days   Jeoffrey Massed, MD  [facility]    To contact the attending provider between 7A-7P or the covering provider during after hours 7P-7A, please log into the web site [email or web address] and access using universal [place] password for that web site. If you do not have the password, please call the hospital operator.  12/15/2022, 11:37 AM

## 2022-12-16 ENCOUNTER — Other Ambulatory Visit (HOSPITAL_COMMUNITY): Payer: Self-pay

## 2022-12-16 DIAGNOSIS — E440 Moderate protein-calorie malnutrition: Secondary | ICD-10-CM

## 2022-12-16 DIAGNOSIS — E1169 Type 2 diabetes mellitus with other specified complication: Secondary | ICD-10-CM | POA: Diagnosis not present

## 2022-12-16 DIAGNOSIS — I4819 Other persistent atrial fibrillation: Secondary | ICD-10-CM | POA: Diagnosis not present

## 2022-12-16 DIAGNOSIS — I5043 Acute on chronic combined systolic (congestive) and diastolic (congestive) heart failure: Secondary | ICD-10-CM

## 2022-12-16 DIAGNOSIS — I639 Cerebral infarction, unspecified: Secondary | ICD-10-CM | POA: Diagnosis not present

## 2022-12-16 DIAGNOSIS — E785 Hyperlipidemia, unspecified: Secondary | ICD-10-CM

## 2022-12-16 LAB — GLUCOSE, CAPILLARY
Glucose-Capillary: 131 mg/dL — ABNORMAL HIGH (ref 70–99)
Glucose-Capillary: 208 mg/dL — ABNORMAL HIGH (ref 70–99)

## 2022-12-16 MED ORDER — LIDOCAINE 5 % EX PTCH: 1.0000 | MEDICATED_PATCH | CUTANEOUS | 0 refills | Status: DC

## 2022-12-16 MED ORDER — HYDROCODONE-ACETAMINOPHEN 5-325 MG PO TABS: 1.0000 | ORAL_TABLET | Freq: Four times a day (QID) | ORAL | 0 refills | Status: DC | PRN

## 2022-12-16 MED ORDER — TRAMADOL HCL 50 MG PO TABS: 50.0000 mg | ORAL_TABLET | Freq: Three times a day (TID) | ORAL | 0 refills | Status: DC

## 2022-12-16 MED ORDER — APIXABAN 2.5 MG PO TABS: 2.5000 mg | ORAL_TABLET | Freq: Two times a day (BID) | ORAL | Status: DC

## 2022-12-16 MED ORDER — INSULIN ASPART 100 UNIT/ML IJ SOLN: INTRAMUSCULAR | 11 refills | Status: DC

## 2022-12-16 MED ORDER — TORSEMIDE 20 MG PO TABS
20.0000 mg | ORAL_TABLET | Freq: Every day | ORAL | 1 refills | Status: DC
Start: 2022-12-16 — End: 2023-01-08

## 2022-12-16 MED ORDER — APIXABAN 2.5 MG PO TABS
2.5000 mg | ORAL_TABLET | Freq: Two times a day (BID) | ORAL | 0 refills | Status: DC
  Filled 2022-12-16: qty 60, 30d supply, fill #0

## 2022-12-16 MED ORDER — PANTOPRAZOLE SODIUM 40 MG PO TBEC: 40.0000 mg | DELAYED_RELEASE_TABLET | Freq: Every day | ORAL | Status: DC

## 2022-12-16 MED ORDER — ENSURE ENLIVE PO LIQD: 237.0000 mL | Freq: Two times a day (BID) | ORAL | 12 refills | Status: DC

## 2022-12-16 MED ORDER — ASPIRIN 81 MG PO TBEC: 81.0000 mg | DELAYED_RELEASE_TABLET | Freq: Every day | ORAL | 12 refills | Status: DC

## 2022-12-16 MED ORDER — ACETAMINOPHEN 160 MG/5ML PO SOLN: 500.0000 mg | Freq: Three times a day (TID) | ORAL | 0 refills | Status: DC

## 2022-12-16 NOTE — Progress Notes (Signed)
Report called to Dorathy Daft, nurse at [facility] for admission today. SW setting up PTAR. Son aware.

## 2022-12-16 NOTE — TOC Transition Note (Signed)
Transition of Care [facility]) - CM/SW Discharge Note   Patient Details  Name: Tara Fields MRN: [id number] Date of Birth: June 14, 1928  Transition of [facility]) CM/SW Contact:  Mearl Latin, LCSW Phone Number: 12/16/2022, 1:09 PM   Clinical Narrative:    Patient will DC to: [facility] Anticipated DC date: 12/16/22 Family notified: Son, [name] Transport by: Sharin Mons    Per MD patient ready for DC to [facility]. RN to call report prior to discharge [phone] room 410a). RN, patient, patient's family, and facility notified of DC. Discharge Summary and FL2 sent to facility. DC packet on chart including signed DNR and script. Son has Eliquis from [facility] pharmacy and will take to the facility. Ambulance transport requested for patient.   CSW will sign off for now as social work intervention is no longer needed. Please consult [place] again if new needs arise.     Final next level of care: Skilled Nursing Facility Barriers to Discharge: Barriers Resolved   Patient Goals and CMS Choice CMS Medicare.gov Compare Post Acute Care list provided to:: Patient Represenative (must comment) Choice offered to / list presented to : Adult Children  Discharge Placement     Existing PASRR number confirmed : 12/16/22          Patient chooses bed at: [facility] Patient to be transferred to facility by: [facility] Name of family member notified: Son Patient and family notified of of transfer: 12/16/22  Discharge Plan and Services Additional resources added to the After Visit Summary for   In-house Referral: Clinical Social Work   Post Acute Care Choice: Skilled Nursing Facility                               Social Determinants of Health (SDOH) Interventions SDOH Screenings   Food Insecurity: No Food Insecurity (09/09/2022)  Housing: Low Risk  (09/09/2022)  Transportation Needs: No Transportation Needs (09/09/2022)  Utilities: Not At Risk (09/09/2022)  Alcohol Screen: Low Risk  (03/17/2021)   Depression (PHQ2-9): High Risk (09/14/2022)  Financial Resource Strain: Low Risk  (08/14/2022)  Physical Activity: Unknown (08/14/2022)  Social Connections: Socially Isolated (08/14/2022)  Stress: No Stress Concern Present (08/14/2022)  Tobacco Use: Medium Risk (12/13/2022)     Readmission Risk Interventions    12/14/2022    3:00 PM 11/09/2021    2:38 PM 09/04/2021    2:20 PM  Readmission Risk Prevention Plan  Transportation Screening Complete Complete Complete  PCP or Specialist Appt within 3-5 Days   Complete  HRI or Home Care Consult   Complete  Social Work Consult for Recovery Care Planning/Counseling   Complete  Palliative Care Screening   Not Applicable  Medication Review [employer]) Complete Complete Complete  PCP or Specialist appointment within 3-5 days of discharge Complete Complete   HRI or Home Care Consult Complete Complete   SW Recovery Care/Counseling Consult Complete Complete   Palliative Care Screening Complete Not Applicable   Skilled Nursing Facility Complete Not Applicable

## 2022-12-16 NOTE — Progress Notes (Signed)
3:15pm-CSW received call from patient's son asking about swelling on patient's left arm. CSW contacted MD and passed the provided info on to son.  Tara Fields, MSW, [facility]

## 2022-12-16 NOTE — Discharge Instructions (Signed)

## 2022-12-16 NOTE — Care Management Important Message (Signed)
Important Message  Patient Details  Name: Tara Fields MRN: [id number] Date of Birth: 13-Mar-1928   Medicare Important Message Given:  Yes     Sherilyn Banker 12/16/2022, 12:39 PM

## 2022-12-16 NOTE — Discharge Summary (Addendum)
PATIENT DETAILS Name: Tara Fields Age: 89 y.o. Sex: female Date of Birth: [birth year] MRN: [id number]. Admitting Physician: Jonah Blue, MD JXB:[id number], Kandee Keen, NP  Admit Date: 12/12/2022 Discharge date: 12/16/2022  Recommendations for Outpatient Follow-up:  Follow up with PCP in 1-2 weeks Please obtain CMP/CBC in one week  Admitted From:  Home  Disposition: Skilled nursing facility   Discharge Condition: fair  CODE STATUS:   Code Status: DNR   Diet recommendation:  Diet Order             DIET DYS 3 Room service appropriate? Yes; Fluid consistency: Thin  Diet effective now           Diet - low sodium heart healthy                    Brief Summary: Patient is a 89 y.o.  female with history of A-fib, chronic HFrEF, COPD, HTN, HLD, dementia who presented with left-sided weakness-was found to have acute CVA and subsequently admitted to the hospitalist service.   Significant events: 4/28>> admit to [facility]   Significant studies: 3/13>> echo: EF 20-25%, TAVR valve with normal structure/function. 4/28>> CXR: No PNA-possibly pulm edema 4/28>> x-ray left shoulder: Degenerative changes 4/28>> CT head: No acute intracranial abnormality. 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  Started on Eliquis in addition to aspirin per recommendation from stroke MD. Plans are for SNF on discharge.   Chronic A-fib with intermittent  RVR Continue metoprolol/Eliquis.   Chronic HFrEF Euvolemic   Prolonged QTc Resolved as of EKG 4/30 Telemetry monitoring   HLD Statin   HTN BP stable with beta-blocker Continue to hold terazosin for now-resume when able   DM-2 CBG stable-SSI   CKD stage IIIb At baseline   Chronic pain Continue scheduled tramadol On Norco for moderate breakthrough pain   Dementia Seems relatively awake/alert this morning Seroquel/trazodone Delirium precautions   Palliative care  DNR in place Extensive discussion with patient's son [name] on 4/30-understands tenuous situation-please see my note on 4/30-goals of care-gentle medical treatment-no aggressive care desired.  Palliative care also following.  Plans are for SNF on discharge.  Please ensure follow-up with palliative care at SNF   Nutrition Status: Nutrition Problem: Moderate Malnutrition Etiology: chronic illness Signs/Symptoms: severe muscle depletion, mild muscle depletion Interventions: Ensure Enlive (each supplement provides 350kcal and 20 grams of protein), MVI, Liberalize Diet    BMI: Estimated body mass index is 27 kg/m as calculated from the following:   Height as of this encounter: 5' (1.524 m).   Weight as of this encounter: 62.7 kg.   Nutrition Status: Nutrition Problem: Moderate Malnutrition Etiology: chronic illness Signs/Symptoms: severe muscle depletion, mild muscle depletion Interventions: Ensure Enlive (each supplement provides 350kcal and 20 grams of protein), MVI, Liberalize Diet   Discharge Diagnoses:  Principal Problem:   Acute CVA (cerebrovascular accident) (HCC) Active Problems:   Persistent atrial fibrillation (HCC)   Stage 3b chronic kidney disease (CKD) (HCC)   HTN (hypertension)   Type 2  diabetes mellitus with hyperlipidemia (HCC)   Cognitive deficits   Malnutrition of moderate degree   DNR (do not resuscitate)   Chronic systolic congestive heart failure (HCC)   Chronic pain   Prolonged  QT interval   Discharge Instructions:  Activity:  As tolerated with Full fall precautions use walker/cane & assistance as needed  Discharge Instructions     Ambulatory referral to Neurology   Complete by: As directed    Follow up with Dr. Pearlean Brownie at [facility] in 4-6 weeks. Too complicated for RN to follow. Thanks.   Diet - low sodium heart healthy   Complete by: As directed    Discharge instructions   Complete by: As directed    Follow with Primary MD  Shirline Frees, NP in 1-2 weeks  Please get a complete blood count and chemistry panel checked by your Primary MD at your next visit, and again as instructed by your Primary MD.  Get Medicines reviewed and adjusted: Please take all your medications with you for your next visit with your Primary MD  Laboratory/radiological data: Please request your Primary MD to go over all hospital tests and procedure/radiological results at the follow up, please ask your Primary MD to get all Hospital records sent to his/her office.  In some cases, they will be blood work, cultures and biopsy results pending at the time of your discharge. Please request that your primary care M.D. follows up on these results.  Also Note the following: If you experience worsening of your admission symptoms, develop shortness of breath, life threatening emergency, suicidal or homicidal thoughts you must seek medical attention immediately by calling 911 or calling your MD immediately  if symptoms less severe.  You must read complete instructions/literature along with all the possible adverse reactions/side effects for all the Medicines you take and that have been prescribed to you. Take any new Medicines after you have completely understood and accpet all the possible adverse reactions/side effects.   Do not drive when taking Pain medications or sleeping medications (Benzodaizepines)  Do not take more than prescribed Pain, Sleep and Anxiety Medications. It is not advisable to  combine anxiety,sleep and pain medications without talking with your primary care practitioner  Special Instructions: If you have smoked or chewed Tobacco  in the last 2 yrs please stop smoking, stop any regular Alcohol  and or any Recreational drug use.  Wear Seat belts while driving.  Please note: You were cared for by a hospitalist during your hospital stay. Once you are discharged, your primary care physician will handle any further medical issues. Please note that NO REFILLS for any discharge medications will be authorized once you are discharged, as it is imperative that you return to your primary care physician (or establish a relationship with a primary care physician if you do not have one) for your post hospital discharge needs so that they can reassess your need for medications and monitor your lab values.   Increase activity slowly   Complete by: As directed       Allergies as of 12/16/2022       Reactions   Amlodipine Besy-benazepril Hcl Cough   Hctz [hydrochlorothiazide] Other (See Comments)   Per family, every diuretic the patient has been on flushes out her out and ultimately results in CRAMPING/JAUNDICE (added potassium might help)   Other Other (See Comments)   History of diverticulitis- CANNOT TOLERATE NUTS, CORN, AND ANY FOODS NOT EASILY DIGESTED- the patient AVOIDS these   Cyclobenzaprine Other (  See Comments)   Confusion- does not tolerate well    Diclofenac Sodium Diarrhea   Hydralazine Other (See Comments)   Per family, every diuretic the patient has been on flushes out her out and ultimately results in CRAMPING/JAUNDICE (added potassium might help)   Methylprednisolone Other (See Comments)   Medrol/CORTICOSTEROIDS = A LOT OF ENERGY Delusions    Oxycodone Other (See Comments)   Patient becomes very disorientated         Medication List     STOP taking these medications    acetaminophen 500 MG tablet Commonly known as: TYLENOL Replaced by: acetaminophen  160 MG/5ML solution   donepezil 5 MG tablet Commonly known as: ARICEPT   potassium chloride 20 MEQ/15ML (10%) Soln   terazosin 10 MG capsule Commonly known as: HYTRIN       TAKE these medications    acetaminophen 160 MG/5ML solution Commonly known as: TYLENOL Take 15.6 mLs (500 mg total) by mouth 3 (three) times daily. Replaces: acetaminophen 500 MG tablet   apixaban 2.5 MG Tabs tablet Commonly known as: Eliquis Take 1 tablet (2.5 mg total) by mouth 2 (two) times daily. What changed:  medication strength how much to take   aspirin EC 81 MG tablet Take 1 tablet (81 mg total) by mouth daily. Swallow whole. Start taking on: Dec 17, 2022   atorvastatin 40 MG tablet Commonly known as: LIPITOR TAKE 1 TABLET BY MOUTH DAILY AT 6 PM. What changed: when to take this   CENTRUM MINIS WOMEN 50+ PO Take 1 tablet by mouth daily.   cholecalciferol 25 MCG (1000 UNIT) tablet Commonly known as: VITAMIN D3 Take 1,000 Units by mouth daily.   Co Q-10 300 MG Caps Take 300 mg by mouth in the morning.   cyanocobalamin 1000 MCG tablet Commonly known as: VITAMIN B12 Take 1 tablet (1,000 mcg total) by mouth daily.   feeding supplement Liqd Take 237 mLs by mouth 2 (two) times daily between meals.   ferrous gluconate 324 MG tablet Commonly known as: FERGON TAKE 1 TABLET BY MOUTH DAILY WITH BREAKFAST   Fish Oil 1200 MG Caps Take 1 capsule by mouth daily.   fluticasone 50 MCG/ACT nasal spray Commonly known as: FLONASE Place 2 sprays into both nostrils daily as needed for allergies or rhinitis.   HYDROcodone-acetaminophen 5-325 MG tablet Commonly known as: NORCO/VICODIN Take 1 tablet by mouth every 6 (six) hours as needed for moderate pain.   insulin aspart 100 UNIT/ML injection Commonly known as: novoLOG 0-6 Units, Subcutaneous, 3 times daily with meals CBG < 70: Implement Hypoglycemia measures CBG 70 - 120: 0 units CBG 121 - 150: 0 units CBG 151 - 200: 1 unit CBG 201-250: 2  units CBG 251-300: 3 units CBG 301-350: 4 units CBG 351-400: 5 units CBG > 400: Give 6 units and call MD   lidocaine 5 % Commonly known as: LIDODERM Place 1 patch onto the skin daily. Remove & Discard patch within 12 hours or as directed by MD   Lidocaine-Menthol (Spray) 4-1 % Liqd Apply 1 spray topically daily as needed (back/knee pain/restless legs).   Magnesium 200 MG Tabs Take 200 mg by mouth every other day.   metoprolol succinate 50 MG 24 hr tablet Commonly known as: Toprol XL Take 1 tablet (50 mg total) by mouth 2 (two) times daily. Take with or immediately following a meal.   ondansetron 4 MG tablet Commonly known as: ZOFRAN Take 4 mg by mouth every 6 (six) hours as needed  for nausea or vomiting.   OSTEO BI-FLEX ADV JOINT SHIELD PO Take 1 tablet by mouth daily.   pantoprazole 40 MG tablet Commonly known as: PROTONIX Take 1 tablet (40 mg total) by mouth daily at 12 noon.   psyllium 58.6 % powder Commonly known as: METAMUCIL Take 1 packet by mouth daily as needed (for constipation).   QUEtiapine 25 MG tablet Commonly known as: SEROQUEL Take 12.5 mg by mouth at bedtime.   sodium chloride 0.65 % Soln nasal spray Commonly known as: OCEAN Place 1 spray into both nostrils as needed for congestion.   spironolactone 25 MG tablet Commonly known as: ALDACTONE Take 12.5 mg by mouth daily.   torsemide 20 MG tablet Commonly known as: DEMADEX Take 1 tablet (20 mg total) by mouth daily. Incase of weight gain 2 to 3 lbs in 24 hrs or 5 lbs in 7 days, take 2 tablets twice a day until weight back to baseline. What changed: how much to take   traMADol 50 MG tablet Commonly known as: ULTRAM Take 1 tablet (50 mg total) by mouth 3 (three) times daily.   traZODone 50 MG tablet Commonly known as: DESYREL Take 25 mg by mouth at bedtime as needed for sleep.        Follow-up Information     Micki Riley, MD. Schedule an appointment as soon as possible for a visit in 1  month(s).   Specialties: Neurology, Radiology Why: stroke clinic Contact information: [address] [place] [address] [phone]         Shirline Frees, NP. Schedule an appointment as soon as possible for a visit in 1 week(s).   Specialty: Family Medicine Contact information: [address] [place] [address] [phone]                Allergies  Allergen Reactions   Amlodipine Besy-Benazepril Hcl Cough   Hctz [Hydrochlorothiazide] Other (See Comments)    Per family, every diuretic the patient has been on flushes out her out and ultimately results in CRAMPING/JAUNDICE (added potassium might help)   Other Other (See Comments)    History of diverticulitis- CANNOT TOLERATE NUTS, CORN, AND ANY FOODS NOT EASILY DIGESTED- the patient AVOIDS these   Cyclobenzaprine Other (See Comments)    Confusion- does not tolerate well    Diclofenac Sodium Diarrhea   Hydralazine Other (See Comments)    Per family, every diuretic the patient has been on flushes out her out and ultimately results in CRAMPING/JAUNDICE (added potassium might help)   Methylprednisolone Other (See Comments)    Medrol/CORTICOSTEROIDS = A LOT OF ENERGY Delusions    Oxycodone Other (See Comments)    Patient becomes very disorientated      Other Procedures/Studies: CT ANGIO HEAD NECK W WO CM  Result Date: 12/14/2022 CLINICAL DATA:  89 year old female with neurologic deficit. Motion degraded MRI yesterday suspicious for posterior right frontal, parietal region acute infarct. EXAM: CT ANGIOGRAPHY HEAD AND NECK WITH AND WITHOUT CONTRAST TECHNIQUE: Multidetector CT imaging of the head and neck was performed using the standard protocol during bolus administration of intravenous contrast. Multiplanar CT image reconstructions and MIPs were obtained to evaluate the vascular anatomy. Carotid stenosis measurements (when applicable) are obtained utilizing NASCET criteria, using the distal  internal carotid diameter as the denominator. RADIATION DOSE REDUCTION: This exam was performed according to the departmental dose-optimization program which includes automated exposure control, adjustment of the mA and/or kV according to patient size and/or use of iterative  reconstruction technique. CONTRAST:  75mL OMNIPAQUE IOHEXOL 350 MG/ML SOLN COMPARISON:  Brain MRI yesterday.  Head CT 12/12/2022. FINDINGS: CT HEAD Brain: Calcified atherosclerosis at the skull base. No midline shift, mass effect, or evidence of intracranial mass lesion. No ventriculomegaly No acute intracranial hemorrhage identified. Mild motion artifact despite repeated imaging attempts but there is mild asymmetric hypodensity in the groove posterosuperior right frontal lobe series 16, image 12 which is concordant with recent infarct there. Grossly stable gray-white differentiation otherwise. Calvarium and skull base: Mild motion artifact. Hyperostosis of the skull. No acute osseous abnormality identified. Paranasal sinuses: Visualized paranasal sinuses and mastoids are stable and well aerated. Orbits: Broad-based posterior vertex scalp hematoma may be mildly progressed on series 6, image 68. Orbits soft tissues appears stable and negative. CTA NECK Skeleton: Absent dentition. Advanced cervical spine degeneration, including C1-C2. No acute osseous abnormality identified. Upper chest: Centrilobular emphysema with superimposed pulmonary septal thickening bilaterally. Partially visible layering pleural effusions, larger on the left with simple fluid density on that side. Visible left main pulmonary artery appears patent. No superior mediastinal lymphadenopathy. Other neck: No acute finding. Aortic arch: Extensive Calcified aortic atherosclerosis. 3 vessel arch. Right carotid system: Moderate proximal brachiocephalic artery calcified plaque with up to 50% stenosis. Mild plaque at the right CCA origin. Occluded right CCA she proximal to the  bifurcation, with contrast enhancement of the vessel gradually terminating by the level of the larynx, glottis. Bulky calcified atherosclerosis at the right carotid bifurcation. However, both the proximal right ECA and ICA are reconstituted. The right ICA is diminutive but patent to the skull base. Left carotid system: Moderate left CCA origin calcified plaque with less than 50% stenosis. Intermittent calcified plaque before the left carotid bifurcation is not hemodynamically significant. Bulky calcified plaque at the bifurcation primarily affects the ICA origin. Estimated stenosis is 64 % with respect to the distal vessel. See series 19, image 115. The left ICA remains patent with mild tortuosity below the skull base. Mild calcified plaque also at the left ICA bulb. Vertebral arteries: Mild proximal right subclavian artery calcified plaque without stenosis. Right vertebral artery origin remains normal. Tortuous right V1 segment. Mild right V2 segment calcified plaque with no significant stenosis to the skull base. Proximal left subclavian artery calcified plaque is initially moderate, but bulky beginning about 12 mm distal to the origin and high-grade stenosis is demonstrated on series 9, image 265, numerically estimated at 80 % with respect to the distal vessel. The left subclavian remains patent and despite adjacent calcified plaque the left vertebral artery origin is patent without stenosis on series 19, image 153. Left vertebral artery is codominant and patent to the skull base with mild V3 segment calcified plaque. CTA HEAD Posterior circulation: Proximal V4 segment calcified plaque bilaterally with only mild stenosis which is greater on the left. Tortuous vertebrobasilar junction is patent without stenosis. Normal right PICA origin. Left AICA may be dominant. Patent basilar artery, diminutive. Fetal type left PCA origin. Patent SCA and PCA origins. Right posterior communicating artery is diminutive or absent.  Left PCA branches are within normal limits. There is calcified plaque of the right PCA P3 with moderate stenosis there. Anterior circulation: Right ICA siphon is patent although with asymmetrically decreased enhancement compared to the left. Moderate superimposed right siphon calcified plaque and mild to moderate right ICA supraclinoid stenosis. The right ICA terminus is patent although asymmetrically decreased enhancement is noted there. Right MCA and ACA origins are patent. Contralateral left ICA siphon is patent with  intense enhancement. Moderate left siphon calcified plaque but only mild left siphon stenosis. Normal left posterior communicating artery origin. Normal left ICA terminus, left MCA and ACA origins. Left ACA enhancement is dominant throughout. Diminutive or absent anterior communicating artery. Right ACA appears patent although with less enhancement. Left MCA M1 segment and bifurcation are patent without stenosis. Left MCA branches are within normal limits. Right MCA M1 remains patent although with asymmetrically decreased enhancement throughout. Right MCA bifurcation is patent. Right MCA M2 and M3 branches are not well evaluated due to decreased enhancement. Venous sinuses: Early contrast timing, not well evaluated. Anatomic variants: Fetal type left PCA origin. Review of the MIP images confirms the above findings IMPRESSION: 1. 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 Reconstituted right ICA and ECA despite superimposed bulky calcified plaque throughout the right carotid bifurcation. Asymmetrically diminished Right ICA and right Anterior circulation enhancement, such that the right M2 and distal MCA branches are not well evaluated. 2. Widespread bulky calcified atherosclerosis elsewhere from the upper chest to the skull base, also notable for: - up to Moderate superimposed calcified plaque of both ICA siphons. - Bulky calcified plaque at the Left ICA origin with estimated 64% stenosis. -  High-grade stenosis of the Left Subclavian Artery, estimated 80% stenosis. - calcified plaque right PCA P3. 3. Partially visible layering pleural effusions, larger on the left. Pulmonary septal thickening compatible with Pulmonary Edema. Aortic Atherosclerosis (ICD10-I70.0) and Emphysema (ICD10-J43.9). 4. Broad-based posterior vertex scalp hematoma may be mildly progressed since yesterday Study discussed by telephone with Dr. Rollene Fare on 12/14/2022 at 06:20 . Electronically Signed   By: Odessa Fleming M.D.   On: 12/14/2022 06:29   EEG adult  Result Date: 12/13/2022 Charlsie Quest, MD     12/13/2022 11:15 AM Patient Name: Tara Fields MRN: [id number] Epilepsy Attending: Charlsie Quest Referring Physician/Provider: Gordy Councilman, MD Date: 12/13/2022 Duration: 23.34 mins Patient history: 89 year old female with left frontal stroke getting EEG to evaluate for seizure. Level of alertness: Awake AEDs during EEG study: None Technical aspects: This EEG study was done with scalp electrodes positioned according to the 10-20 International system of electrode placement. Electrical activity was reviewed with band pass filter of 1-70Hz , sensitivity of 7 uV/mm, display speed of 67mm/sec with a 60Hz  notched filter applied as appropriate. EEG data were recorded continuously and digitally stored.  Video monitoring was available and reviewed as appropriate. Description: The posterior dominant rhythm consists of 7.5 Hz activity of moderate voltage (25-35 uV) seen predominantly in posterior head regions, symmetric and reactive to eye opening and eye closing. EEG showed intermittent generalized 3 to 6 Hz theta-delta slowing. Hyperventilation and photic stimulation were not performed.   ABNORMALITY - Intermittent slow, generalized IMPRESSION: This study is suggestive of mild diffuse encephalopathy. No seizures or epileptiform discharges were seen throughout the recording. Charlsie Quest   MR BRAIN WO CONTRAST  Result Date:  12/13/2022 CLINICAL DATA:  Stroke workup EXAM: MRI HEAD WITHOUT CONTRAST TECHNIQUE: Multiplanar, multiecho pulse sequences of the brain and surrounding structures were obtained without intravenous contrast. COMPARISON:  Head CT from yesterday FINDINGS: Only diffusion imaging was acquired, and very much motion degraded, due to patient condition despite sedative medication. Especially on coronal acquisition small areas of cortex and white matter infarct or suspected at high right frontal lobe. No hydrocephalus or gross midline shift. IMPRESSION: Very truncated and motion degraded study. Small acute infarct is still suspected at the high right frontal parietal junction. Electronically Signed  By: Tiburcio Pea M.D.   On: 12/13/2022 05:10   CT Head Wo Contrast  Result Date: 12/12/2022 CLINICAL DATA:  Initial evaluation for neuro deficit, stroke suspected. EXAM: CT HEAD WITHOUT CONTRAST TECHNIQUE: Contiguous axial images were obtained from the base of the skull through the vertex without intravenous contrast. RADIATION DOSE REDUCTION: This exam was performed according to the departmental dose-optimization program which includes automated exposure control, adjustment of the mA and/or kV according to patient size and/or use of iterative reconstruction technique. COMPARISON:  Prior study from 12/11/2022. FINDINGS: Brain: Age-related cerebral volume loss with chronic small vessel ischemic disease. No acute intracranial hemorrhage. No acute large vessel territory infarct. No mass lesion or midline shift. No hydrocephalus or extra-axial fluid collection. Prominent dural base calcifications noted about the falx. Vascular: No abnormal hyperdense vessel. Calcified atherosclerosis present at the skull base. Skull: Small left parietal scalp contusion. Calvarium intact. Hyperostosis frontalis interna noted. Sinuses/Orbits: Globes orbital soft tissues within normal limits. Small volume pneumatized secretions noted within the  left sphenoid sinus. Paranasal sinuses are otherwise clear. No mastoid effusion. Other: None. IMPRESSION: 1. No acute intracranial abnormality. 2. Small left parietal scalp contusion. No calvarial fracture. 3. Age-related cerebral volume loss with chronic small vessel ischemic disease. Electronically Signed   By: Rise Mu M.D.   On: 12/12/2022 22:46   DG Chest Port 1 View  Result Date: 12/12/2022 CLINICAL DATA:  Left arm weakness.  History of lung cancer. EXAM: PORTABLE CHEST 1 VIEW COMPARISON:  Chest x-ray 12/11/2022.  CT chest 12/11/2022. FINDINGS: Heart is enlarged. Patient is status post TAVR. There are patchy airspace and central interstitial opacities bilaterally. There is a band of opacity in the left upper lobe appears unchanged from most recent CT. No pleural effusion or pneumothorax. No acute fractures are seen. IMPRESSION: 1. Cardiomegaly with patchy airspace and central interstitial opacities bilaterally, likely pulmonary edema. 2. Band of opacity in the left upper lobe appears unchanged from most recent CT. Electronically Signed   By: Darliss Cheney M.D.   On: 12/12/2022 21:48   DG Shoulder Left  Result Date: 12/12/2022 CLINICAL DATA:  Weakness EXAM: LEFT SHOULDER - 2+ VIEW COMPARISON:  None Available. FINDINGS: Degenerative changes of the acromioclavicular joint and glenohumeral joint are seen. No acute fracture or dislocation is noted. Linear density is noted similar to that seen on recent chest CT consistent with post radiation change. No underlying bony abnormality is noted. IMPRESSION: Degenerative change without acute abnormality. Electronically Signed   By: Alcide Clever M.D.   On: 12/12/2022 21:47   CT T-SPINE NO CHARGE  Result Date: 12/11/2022 CLINICAL DATA:  Trauma, history of lung cancer, * Tracking Code: BO * EXAM: CT CHEST, ABDOMEN AND PELVIS WITH CONTRAST CT THORACIC AND LUMBAR SPINE WITH CONTRAST TECHNIQUE: Multidetector CT imaging of the chest, abdomen and pelvis  was performed following the standard protocol during bolus administration of intravenous contrast. Multidetector CT imaging of the thoracic and lumbar spine was performed following the standard protocol during bolus administration of intravenous contrast. RADIATION DOSE REDUCTION: This exam was performed according to the departmental dose-optimization program which includes automated exposure control, adjustment of the mA and/or kV according to patient size and/or use of iterative reconstruction technique. COMPARISON:  CT chest abdomen pelvis, 04/23/2022 FINDINGS: CT CHEST FINDINGS Cardiovascular: Aortic atherosclerosis. Aortic valve stent endograft. Cardiomegaly. Enlargement of the main pulmonary artery measuring up to 3.6 cm in caliber. Three-vessel coronary artery calcifications. No pericardial effusion. Mediastinum/Nodes: No enlarged mediastinal, hilar, or axillary lymph  nodes. Thyroid gland, trachea, and esophagus demonstrate no significant findings. Lungs/Pleura: Moderate centrilobular emphysema. Unchanged post treatment/post radiation appearance of the suprahilar left lung (series 6, image 40). Small bilateral pleural effusions and diffuse interlobular septal thickening throughout. Musculoskeletal: No chest wall mass or suspicious osseous lesions identified. CT ABDOMEN PELVIS FINDINGS Hepatobiliary: No focal liver abnormality is seen. Status post cholecystectomy. Unchanged postoperative biliary ductal dilatation. Pancreas: Unremarkable. No pancreatic ductal dilatation or surrounding inflammatory changes. Spleen: Normal in size without significant abnormality. Adrenals/Urinary Tract: Unchanged, definitively benign left adrenal adenoma, for which no further follow-up or characterization is required (series 4, image 63). Kidneys are normal, without renal calculi, solid lesion, or hydronephrosis. Bladder is unremarkable. Stomach/Bowel: Stomach is within normal limits. Appendix is not clearly visualized. No  evidence of bowel wall thickening, distention, or inflammatory changes. Descending and sigmoid diverticulosis. Vascular/Lymphatic: Aortic atherosclerosis. No enlarged abdominal or pelvic lymph nodes. Reproductive: No mass or other abnormality. Other: Small broad-based midline ventral hernia (series 4, image 80). No ascites. Musculoskeletal: No acute osseous findings. CT THORACIC AND LUMBAR SPINE FINDINGS Alignment: Normal thoracic kyphosis. Degenerative and postoperative straightening of the normal lumbar lordosis. Vertebral bodies: Osteopenia.  No fracture or dislocation. Disc spaces: Status post posterior fusion of L4 through S1. Moderate disc space height loss and osteophytosis throughout the thoracic and lumbar spine, worst at L1 through L4. Paraspinous soft tissues: Unremarkable. IMPRESSION: 1. No CT evidence of acute traumatic injury to the chest, abdomen, or pelvis. 2. No fracture or dislocation of the thoracic or lumbar spine. 3. Unchanged post treatment/post radiation appearance of the suprahilar left lung. No evidence of lymphadenopathy or metastatic disease in the chest, abdomen, or pelvis. 4. Small bilateral pleural effusions and diffuse interlobular septal thickening throughout the lungs, consistent with pulmonary edema. 5. Cardiomegaly and coronary artery disease. 6. Enlargement of the main pulmonary artery, as can be seen in pulmonary hypertension. 7. Emphysema. Aortic Atherosclerosis (ICD10-I70.0) and Emphysema (ICD10-J43.9). Electronically Signed   By: Jearld Lesch M.D.   On: 12/11/2022 16:56   CT L-SPINE NO CHARGE  Result Date: 12/11/2022 CLINICAL DATA:  Trauma, history of lung cancer, * Tracking Code: BO * EXAM: CT CHEST, ABDOMEN AND PELVIS WITH CONTRAST CT THORACIC AND LUMBAR SPINE WITH CONTRAST TECHNIQUE: Multidetector CT imaging of the chest, abdomen and pelvis was performed following the standard protocol during bolus administration of intravenous contrast. Multidetector CT imaging of the  thoracic and lumbar spine was performed following the standard protocol during bolus administration of intravenous contrast. RADIATION DOSE REDUCTION: This exam was performed according to the departmental dose-optimization program which includes automated exposure control, adjustment of the mA and/or kV according to patient size and/or use of iterative reconstruction technique. COMPARISON:  CT chest abdomen pelvis, 04/23/2022 FINDINGS: CT CHEST FINDINGS Cardiovascular: Aortic atherosclerosis. Aortic valve stent endograft. Cardiomegaly. Enlargement of the main pulmonary artery measuring up to 3.6 cm in caliber. Three-vessel coronary artery calcifications. No pericardial effusion. Mediastinum/Nodes: No enlarged mediastinal, hilar, or axillary lymph nodes. Thyroid gland, trachea, and esophagus demonstrate no significant findings. Lungs/Pleura: Moderate centrilobular emphysema. Unchanged post treatment/post radiation appearance of the suprahilar left lung (series 6, image 40). Small bilateral pleural effusions and diffuse interlobular septal thickening throughout. Musculoskeletal: No chest wall mass or suspicious osseous lesions identified. CT ABDOMEN PELVIS FINDINGS Hepatobiliary: No focal liver abnormality is seen. Status post cholecystectomy. Unchanged postoperative biliary ductal dilatation. Pancreas: Unremarkable. No pancreatic ductal dilatation or surrounding inflammatory changes. Spleen: Normal in size without significant abnormality. Adrenals/Urinary Tract: Unchanged, definitively benign left adrenal adenoma,  for which no further follow-up or characterization is required (series 4, image 63). Kidneys are normal, without renal calculi, solid lesion, or hydronephrosis. Bladder is unremarkable. Stomach/Bowel: Stomach is within normal limits. Appendix is not clearly visualized. No evidence of bowel wall thickening, distention, or inflammatory changes. Descending and sigmoid diverticulosis. Vascular/Lymphatic: Aortic  atherosclerosis. No enlarged abdominal or pelvic lymph nodes. Reproductive: No mass or other abnormality. Other: Small broad-based midline ventral hernia (series 4, image 80). No ascites. Musculoskeletal: No acute osseous findings. CT THORACIC AND LUMBAR SPINE FINDINGS Alignment: Normal thoracic kyphosis. Degenerative and postoperative straightening of the normal lumbar lordosis. Vertebral bodies: Osteopenia.  No fracture or dislocation. Disc spaces: Status post posterior fusion of L4 through S1. Moderate disc space height loss and osteophytosis throughout the thoracic and lumbar spine, worst at L1 through L4. Paraspinous soft tissues: Unremarkable. IMPRESSION: 1. No CT evidence of acute traumatic injury to the chest, abdomen, or pelvis. 2. No fracture or dislocation of the thoracic or lumbar spine. 3. Unchanged post treatment/post radiation appearance of the suprahilar left lung. No evidence of lymphadenopathy or metastatic disease in the chest, abdomen, or pelvis. 4. Small bilateral pleural effusions and diffuse interlobular septal thickening throughout the lungs, consistent with pulmonary edema. 5. Cardiomegaly and coronary artery disease. 6. Enlargement of the main pulmonary artery, as can be seen in pulmonary hypertension. 7. Emphysema. Aortic Atherosclerosis (ICD10-I70.0) and Emphysema (ICD10-J43.9). Electronically Signed   By: Jearld Lesch M.D.   On: 12/11/2022 16:56   CT CHEST ABDOMEN PELVIS W CONTRAST  Result Date: 12/11/2022 CLINICAL DATA:  Trauma, history of lung cancer, * Tracking Code: BO * EXAM: CT CHEST, ABDOMEN AND PELVIS WITH CONTRAST CT THORACIC AND LUMBAR SPINE WITH CONTRAST TECHNIQUE: Multidetector CT imaging of the chest, abdomen and pelvis was performed following the standard protocol during bolus administration of intravenous contrast. Multidetector CT imaging of the thoracic and lumbar spine was performed following the standard protocol during bolus administration of intravenous  contrast. RADIATION DOSE REDUCTION: This exam was performed according to the departmental dose-optimization program which includes automated exposure control, adjustment of the mA and/or kV according to patient size and/or use of iterative reconstruction technique. COMPARISON:  CT chest abdomen pelvis, 04/23/2022 FINDINGS: CT CHEST FINDINGS Cardiovascular: Aortic atherosclerosis. Aortic valve stent endograft. Cardiomegaly. Enlargement of the main pulmonary artery measuring up to 3.6 cm in caliber. Three-vessel coronary artery calcifications. No pericardial effusion. Mediastinum/Nodes: No enlarged mediastinal, hilar, or axillary lymph nodes. Thyroid gland, trachea, and esophagus demonstrate no significant findings. Lungs/Pleura: Moderate centrilobular emphysema. Unchanged post treatment/post radiation appearance of the suprahilar left lung (series 6, image 40). Small bilateral pleural effusions and diffuse interlobular septal thickening throughout. Musculoskeletal: No chest wall mass or suspicious osseous lesions identified. CT ABDOMEN PELVIS FINDINGS Hepatobiliary: No focal liver abnormality is seen. Status post cholecystectomy. Unchanged postoperative biliary ductal dilatation. Pancreas: Unremarkable. No pancreatic ductal dilatation or surrounding inflammatory changes. Spleen: Normal in size without significant abnormality. Adrenals/Urinary Tract: Unchanged, definitively benign left adrenal adenoma, for which no further follow-up or characterization is required (series 4, image 63). Kidneys are normal, without renal calculi, solid lesion, or hydronephrosis. Bladder is unremarkable. Stomach/Bowel: Stomach is within normal limits. Appendix is not clearly visualized. No evidence of bowel wall thickening, distention, or inflammatory changes. Descending and sigmoid diverticulosis. Vascular/Lymphatic: Aortic atherosclerosis. No enlarged abdominal or pelvic lymph nodes. Reproductive: No mass or other abnormality. Other:  Small broad-based midline ventral hernia (series 4, image 80). No ascites. Musculoskeletal: No acute osseous findings. CT THORACIC AND LUMBAR SPINE  FINDINGS Alignment: Normal thoracic kyphosis. Degenerative and postoperative straightening of the normal lumbar lordosis. Vertebral bodies: Osteopenia.  No fracture or dislocation. Disc spaces: Status post posterior fusion of L4 through S1. Moderate disc space height loss and osteophytosis throughout the thoracic and lumbar spine, worst at L1 through L4. Paraspinous soft tissues: Unremarkable. IMPRESSION: 1. No CT evidence of acute traumatic injury to the chest, abdomen, or pelvis. 2. No fracture or dislocation of the thoracic or lumbar spine. 3. Unchanged post treatment/post radiation appearance of the suprahilar left lung. No evidence of lymphadenopathy or metastatic disease in the chest, abdomen, or pelvis. 4. Small bilateral pleural effusions and diffuse interlobular septal thickening throughout the lungs, consistent with pulmonary edema. 5. Cardiomegaly and coronary artery disease. 6. Enlargement of the main pulmonary artery, as can be seen in pulmonary hypertension. 7. Emphysema. Aortic Atherosclerosis (ICD10-I70.0) and Emphysema (ICD10-J43.9). Electronically Signed   By: Jearld Lesch M.D.   On: 12/11/2022 16:56   CT HEAD WO CONTRAST  Result Date: 12/11/2022 CLINICAL DATA:  Trauma EXAM: CT HEAD WITHOUT CONTRAST CT CERVICAL SPINE WITHOUT CONTRAST TECHNIQUE: Multidetector CT imaging of the head and cervical spine was performed following the standard protocol without intravenous contrast. Multiplanar CT image reconstructions of the cervical spine were also generated. RADIATION DOSE REDUCTION: This exam was performed according to the departmental dose-optimization program which includes automated exposure control, adjustment of the mA and/or kV according to patient size and/or use of iterative reconstruction technique. COMPARISON:  11/26/2022 FINDINGS: CT HEAD  FINDINGS Brain: No evidence of acute infarction, hemorrhage, hydrocephalus, extra-axial collection or mass lesion/mass effect. Periventricular white matter hypodensity. Vascular: No hyperdense vessel or unexpected calcification. Skull: Hyperostosis frontalis. Negative for fracture or focal lesion. Sinuses/Orbits: No acute finding. Other: Soft tissue contusion of the left scalp vertex (series 3, image 27). CT CERVICAL SPINE FINDINGS Alignment: Normal. Skull base and vertebrae: No acute fracture. No primary bone lesion or focal pathologic process. Soft tissues and spinal canal: No prevertebral fluid or swelling. No visible canal hematoma. Disc levels: Severe multilevel disc and facet degenerative disease and osteophytosis of the cervical spine, worst from C5-C7. Upper chest: Negative. Other: None. IMPRESSION: 1. No acute intracranial pathology. Small-vessel white matter disease. 2. Soft tissue contusion of the left scalp vertex. 3. No fracture or static subluxation of the cervical spine. 4. Severe multilevel disc and facet degenerative disease of the cervical spine, worst from C5-C7. Electronically Signed   By: Jearld Lesch M.D.   On: 12/11/2022 16:25   CT CERVICAL SPINE WO CONTRAST  Result Date: 12/11/2022 CLINICAL DATA:  Trauma EXAM: CT HEAD WITHOUT CONTRAST CT CERVICAL SPINE WITHOUT CONTRAST TECHNIQUE: Multidetector CT imaging of the head and cervical spine was performed following the standard protocol without intravenous contrast. Multiplanar CT image reconstructions of the cervical spine were also generated. RADIATION DOSE REDUCTION: This exam was performed according to the departmental dose-optimization program which includes automated exposure control, adjustment of the mA and/or kV according to patient size and/or use of iterative reconstruction technique. COMPARISON:  11/26/2022 FINDINGS: CT HEAD FINDINGS Brain: No evidence of acute infarction, hemorrhage, hydrocephalus, extra-axial collection or mass  lesion/mass effect. Periventricular white matter hypodensity. Vascular: No hyperdense vessel or unexpected calcification. Skull: Hyperostosis frontalis. Negative for fracture or focal lesion. Sinuses/Orbits: No acute finding. Other: Soft tissue contusion of the left scalp vertex (series 3, image 27). CT CERVICAL SPINE FINDINGS Alignment: Normal. Skull base and vertebrae: No acute fracture. No primary bone lesion or focal pathologic process. Soft tissues and spinal canal:  No prevertebral fluid or swelling. No visible canal hematoma. Disc levels: Severe multilevel disc and facet degenerative disease and osteophytosis of the cervical spine, worst from C5-C7. Upper chest: Negative. Other: None. IMPRESSION: 1. No acute intracranial pathology. Small-vessel white matter disease. 2. Soft tissue contusion of the left scalp vertex. 3. No fracture or static subluxation of the cervical spine. 4. Severe multilevel disc and facet degenerative disease of the cervical spine, worst from C5-C7. Electronically Signed   By: Jearld Lesch M.D.   On: 12/11/2022 16:25   DG Pelvis Portable  Result Date: 12/11/2022 CLINICAL DATA:  Trauma. EXAM: PORTABLE PELVIS 1-2 VIEWS COMPARISON:  None Available. FINDINGS: There is no evidence of pelvic fracture or diastasis. No pelvic bone lesions are seen. Severe osteoarthritic changes of the right hip, moderate osteoarthritic changes of the left hip. Vascular calcifications noted. Lower lumbosacral spine fusion. IMPRESSION: 1. No acute fracture or diastasis of the pelvis. 2. Severe osteoarthritic changes of the right hip, moderate osteoarthritic changes of the left hip. Electronically Signed   By: Ted Mcalpine M.D.   On: 12/11/2022 13:28   DG Chest Port 1 View  Result Date: 12/11/2022 CLINICAL DATA:  Fall. EXAM: PORTABLE CHEST 1 VIEW COMPARISON:  11/26/2022 FINDINGS: The cardio pericardial silhouette is enlarged. Status post TAVR. Interstitial markings are diffusely coarsened with  chronic features. Streaky left parahilar density is likely atelectasis and mildly progressive in the interval. Bones are diffusely demineralized. IMPRESSION: Streaky left parahilar density is likely atelectasis and mildly progressive in the interval. No evidence for pneumothorax or pleural effusion. Electronically Signed   By: Kennith Center M.D.   On: 12/11/2022 13:27   CT Head Wo Contrast  Result Date: 11/26/2022 CLINICAL DATA:  Mental status change, unknown cause EXAM: CT HEAD WITHOUT CONTRAST TECHNIQUE: Contiguous axial images were obtained from the base of the skull through the vertex without intravenous contrast. RADIATION DOSE REDUCTION: This exam was performed according to the departmental dose-optimization program which includes automated exposure control, adjustment of the mA and/or kV according to patient size and/or use of iterative reconstruction technique. COMPARISON:  Head CT 04/23/2022 FINDINGS: Brain: No intracranial hemorrhage, mass effect, or midline shift. Age related atrophy. No hydrocephalus. The basilar cisterns are patent. Bilateral basal ganglia mineralization, likely senescent. Similar periventricular chronic small vessel ischemic change. No evidence of territorial infarct or acute ischemia. No extra-axial or intracranial fluid collection. Vascular: Atherosclerosis of skullbase vasculature without hyperdense vessel or abnormal calcification. Skull: No fracture or focal lesion. Chronic frontal hyperostosis. Sinuses/Orbits: Minimal mucosal thickening in the left side of sphenoid sinus. No mastoid effusion. Other: None. IMPRESSION: 1. No acute intracranial abnormality. 2. Age related atrophy and chronic small vessel ischemic change. Electronically Signed   By: Narda Rutherford M.D.   On: 11/26/2022 17:01   DG Chest Port 1 View  Result Date: 11/26/2022 CLINICAL DATA:  Shortness of breath EXAM: PORTABLE CHEST 1 VIEW COMPARISON:  09/07/2022 FINDINGS: Transverse diameter of heart is  increased. Aortic valve prosthesis is seen. There are no signs of alveolar pulmonary edema or focal pulmonary consolidation. There is no pleural effusion or pneumothorax. IMPRESSION: Cardiomegaly. There are no signs of pulmonary edema or focal pulmonary consolidation. Electronically Signed   By: Ernie Avena M.D.   On: 11/26/2022 13:41     TODAY-DAY OF DISCHARGE:  Subjective:   Tara Fields today has no headache,no chest abdominal pain,no new weakness tingling or numbness, feels much better wants to go home today.  Objective:   Blood pressure (!) 142/75,  pulse 85, temperature (!) 97 F (36.1 C), temperature source Axillary, resp. rate 15, height 5' (1.524 m), weight 62.7 kg, SpO2 95 %.  Intake/Output Summary (Last 24 hours) at 12/16/2022 0947 Last data filed at 12/16/2022 0600 Gross per 24 hour  Intake 100 ml  Output 500 ml  Net -400 ml   Filed Weights   12/12/22 2031  Weight: 62.7 kg    Exam: Awake Alert, Oriented *3, No new F.N deficits, Normal affect [place].AT,PERRAL Supple Neck,No JVD, No cervical lymphadenopathy appriciated.  Symmetrical Chest wall movement, Good air movement bilaterally, CTAB RRR,No Gallops,Rubs or new Murmurs, No Parasternal Heave +ve B.Sounds, Abd Soft, Non tender, No organomegaly appriciated, No rebound -guarding or rigidity. No Cyanosis, Clubbing or edema, No new Rash or bruise   PERTINENT RADIOLOGIC STUDIES: No results found.   PERTINENT LAB RESULTS: CBC: No results for input(s): "WBC", "HGB", "HCT", "PLT" in the last 72 hours. CMET CMP     Component Value Date/Time   NA 137 12/12/2022 2215   NA 139 12/03/2022 1019   K 3.7 12/12/2022 2215   CL 100 12/12/2022 2215   CO2 22 12/12/2022 2215   GLUCOSE 114 (H) 12/12/2022 2215   BUN 36 (H) 12/12/2022 2215   BUN 44 (H) 12/03/2022 1019   CREATININE 1.25 (H) 12/12/2022 2215   CALCIUM 9.0 12/12/2022 2215   PROT 6.7 12/11/2022 1322   ALBUMIN 3.6 12/11/2022 1322   AST 27 12/11/2022 1322    ALT 17 12/11/2022 1322   ALKPHOS 72 12/11/2022 1322   BILITOT 0.9 12/11/2022 1322   GFRNONAA 40 (L) 12/12/2022 2215    GFR Estimated Creatinine Clearance: 23.8 mL/min (A) (by C-G formula based on SCr of 1.25 mg/dL (H)). No results for input(s): "LIPASE", "AMYLASE" in the last 72 hours. No results for input(s): "CKTOTAL", "CKMB", "CKMBINDEX", "TROPONINI" in the last 72 hours. Invalid input(s): "POCBNP" No results for input(s): "DDIMER" in the last 72 hours. Recent Labs    12/13/22 1249  HGBA1C 6.6*   Recent Labs    12/13/22 1219  CHOL 105  HDL 32*  LDLCALC 54  TRIG 97  CHOLHDL 3.3   No results for input(s): "TSH", "T4TOTAL", "T3FREE", "THYROIDAB" in the last 72 hours.  Invalid input(s): "FREET3" No results for input(s): "VITAMINB12", "FOLATE", "FERRITIN", "TIBC", "IRON", "RETICCTPCT" in the last 72 hours. Coags: No results for input(s): "INR" in the last 72 hours.  Invalid input(s): "PT" Microbiology: No results found for this or any previous visit (from the past 240 hour(s)).  FURTHER DISCHARGE INSTRUCTIONS:  Get Medicines reviewed and adjusted: Please take all your medications with you for your next visit with your Primary MD  Laboratory/radiological data: Please request your Primary MD to go over all hospital tests and procedure/radiological results at the follow up, please ask your Primary MD to get all Hospital records sent to his/her office.  In some cases, they will be blood work, cultures and biopsy results pending at the time of your discharge. Please request that your primary care M.D. goes through all the records of your hospital data and follows up on these results.  Also Note the following: If you experience worsening of your admission symptoms, develop shortness of breath, life threatening emergency, suicidal or homicidal thoughts you must seek medical attention immediately by calling 911 or calling your MD immediately  if symptoms less severe.  You  must read complete instructions/literature along with all the possible adverse reactions/side effects for all the Medicines you take and that have been  prescribed to you. Take any new Medicines after you have completely understood and accpet all the possible adverse reactions/side effects.   Do not drive when taking Pain medications or sleeping medications (Benzodaizepines)  Do not take more than prescribed Pain, Sleep and Anxiety Medications. It is not advisable to combine anxiety,sleep and pain medications without talking with your primary care practitioner  Special Instructions: If you have smoked or chewed Tobacco  in the last 2 yrs please stop smoking, stop any regular Alcohol  and or any Recreational drug use.  Wear Seat belts while driving.  Please note: You were cared for by a hospitalist during your hospital stay. Once you are discharged, your primary care physician will handle any further medical issues. Please note that NO REFILLS for any discharge medications will be authorized once you are discharged, as it is imperative that you return to your primary care physician (or establish a relationship with a primary care physician if you do not have one) for your post hospital discharge needs so that they can reassess your need for medications and monitor your lab values.  Total Time spent coordinating discharge including counseling, education and face to face time equals greater than 30 minutes.  SignedJeoffrey Massed 12/16/2022 9:47 AM

## 2022-12-16 NOTE — Progress Notes (Signed)
Brief Palliative Medicine Progress Note:  Notified by TOC that patient's son was concerned about her decreased oral intake. Patient is scheduled to be discharge today. Patient is already enrolled in [facility] outpatient Palliative Care program - notified their liaison that patient has decreased oral intake as she discharges to rehab facility. Requested one of their Palliative Care providers follow up with patient sooner than later after discharge as hospice may need to be considered soon pending her clinical course and goals. [facility] liaison will notify their provider.  Thank you for letting [facility] assist in the care of this patient.  Elgin Carn M. Katrinka Blazing [facility] Palliative Medicine Team Team Phone: [phone]

## 2022-12-21 ENCOUNTER — Ambulatory Visit: Payer: Medicare Other | Admitting: Podiatry

## 2022-12-25 ENCOUNTER — Other Ambulatory Visit: Payer: Self-pay | Admitting: Adult Health

## 2022-12-25 DIAGNOSIS — I5043 Acute on chronic combined systolic (congestive) and diastolic (congestive) heart failure: Secondary | ICD-10-CM

## 2022-12-27 ENCOUNTER — Non-Acute Institutional Stay: Payer: Medicare Other | Admitting: Family Medicine

## 2022-12-27 ENCOUNTER — Encounter: Payer: Self-pay | Admitting: Family Medicine

## 2022-12-27 VITALS — BP 112/66 | HR 81 | Temp 97.6°F | Resp 20 | Wt 154.2 lb

## 2022-12-27 DIAGNOSIS — I639 Cerebral infarction, unspecified: Secondary | ICD-10-CM

## 2022-12-27 DIAGNOSIS — F0392 Unspecified dementia, unspecified severity, with psychotic disturbance: Secondary | ICD-10-CM

## 2022-12-27 DIAGNOSIS — Z515 Encounter for palliative care: Secondary | ICD-10-CM

## 2022-12-27 DIAGNOSIS — I69391 Dysphagia following cerebral infarction: Secondary | ICD-10-CM

## 2022-12-27 DIAGNOSIS — I5022 Chronic systolic (congestive) heart failure: Secondary | ICD-10-CM

## 2022-12-27 DIAGNOSIS — Z7401 Bed confinement status: Secondary | ICD-10-CM

## 2022-12-27 NOTE — Progress Notes (Signed)
Therapist, nutritional Palliative Care Consult Note Telephone: [phone]  Fax: [phone]   Date of encounter: 12/27/22 1:02 PM PATIENT NAME: Tara Fields [address] [place] [address]   [id number] (home)  DOB: 03/27/1928 MRN: [id number] PRIMARY CARE PROVIDER:    Shirline Frees, NP,  [address] [place] [address] [phone]  REFERRING PROVIDER:   Jaclyn Prime, NP [facility]   Emergency Contact:    Contact Information     Name Relation Home Work [place] Son   [phone]       [facility], [name]   I met face to face with patient, son [name] and daughter-in-law Inocencio Homes at [facility]. Palliative Care was asked to follow this patient by consultation request of Jaclyn Prime, NP to address advance care planning and complex medical decision making. This is an initial skilled nursing visit on existing Palliative Care patient.  ADVANCE CARE PLANNING/GOALS OF CARE: Health care surrogate gave his permission to discuss.   PRESENT FOR DISCUSSION: Son [name] GOALS: Comfort based care at the lowest level possible but with assist of Hospice, if appropriate.  BARRIERS: Patient is not progressing with therapy, is cognitively unable to follow directions and pt currently on skilled days at the facility.  Patient will require 24/7 care and is dependent on all ADLs.   Our advance care planning conversation included a discussion about:    Experiences with loved ones who have been seriously ill or have died-He believes that her confusion may be a reaction to her opiod pain medicines.  Advised that she remains on Tramadol and that Hydrocodone is only for moderate pain as needed which is due to be d/c'd on 12/30/22.  He states not wanting her to hurt but knowing that before this has created some confusion and says he thinks that she has been more clear in the afternoons "when the medicine clears out." Pt does  have Trazodone 25 mg and Seroquel 12.5 mg QHS. Exploration of personal, cultural or spiritual beliefs that might influence medical decisions  Review of an existing advance directive document.  CODE STATUS: MOST as of 12/30/21: DNR/DNI with limited additional intervention Use of antibiotics as indicated  IV fluids and feeding tube on a case by case, time limited basis No feeding tube.   I spent 42 minutes providing this consultation with more than 50% of the time spent on counseling patient and coordinating communication with referring provider, staff/family, chart review and documentation. 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 Noted dysarthria, dysphagia and worsening cognition Max assist for all ADLs Agree with continuation of reduced dose Eliquis 2.5 mg po BID for now, discussed risks/benefits given age/fall risk and risk of bleed with son to consider at some point possible d/c of Eliquis. Agree with Atorvastatin 40 mg daily for now given recent acute stroke but may need to consider de-escalation due to no immediate benefit after 30-90 days post stroke and with CKD stage 3. Continue PT/OT/ST for now.  If no significant improvement, will likely be candidate for Hospice.  2.    Dementia with psychotic disturbance, unspecified type Complicated by recent CVA with deficits. Prior to stroke, FAST 7 score 4 as of April 2024 Had been on Abilify and on prophylactic therapy for recurrent UTI for mental status changes and had just been recently started on Aricept. Doubt benefit to giving Aricept currently. Pt was non-compliant at home  on Seroquel.  3.    Chronic systolic CHF Euvolemic at present, pt was not on continuous O2 at home and with stable O2  sats in the mid 90s on room air, would reserve O2 use for when O2 sat < 92%. Continue Beta blocker and Torsemide, monitor potassium level and replete prn.  4.    Dysphagia due to recent CVA Noted pocketing per CT. Advised son on likely risks of aspiration and aspiration pneumonia with pocketing. Discussed with difficulty swallowing pills recommendation to de-escalate regimen for those meds with no immediate benefit (vitamin's B and D, likely Magnesium) to allow for necessary medication.   Son resistant to d/c of vitamin B12 as pt had at one point been in the 90s, most recent B12 level over 74 year old was high in 10s. Recommended meds crushed in applesauce, pudding or ice cream Discussed risks vs benefits of high risk meds: Eliquis and Atorvastatin which may have some benefit for 30-60 days post CVA then consider d/c. Continue pureed diet, check for pocketing, Provide oral care prior to meals to decrease bacterial content.  5.    Bedbound Recommend frequent repositioning to avoid skin breakdown. Float heels when in bed or use of pressure reduction boots. Albumin at d/c was sufficient but with poor intake, recommend supplement with protein. Increase Sennosides to 2 tabs BID to help with constipation since pt cannot sit on BSC for BM.  6.    Palliative Care Encounter Recommend use of folded washcloth in left hand to prevent contracture. Advised son that given current lack of progress with therapy and cognitive impairment that is impeding education pt will need 24/7 supervision and assistance. Advised son that pt will likely be a candidate for Hospice Care once skilled days are exhausted if no change in function. Recommend re-visiting goals of care/MOST form at next contact to see if he wants to make changes or keep same instruction.  Follow up Palliative Care Visit:  Palliative Care continuing to follow up by monitoring for changes in appetite, weight, functional and cognitive status for  chronic disease progression and management in agreement with patient's stated goals of care. Next visit in 1-2 weeks or prn.  This visit was coded based on medical decision making (MDM).  Chief Complaint  Palliative Care received a referral to follow existing Palliative patient at [facility] where she was admitted for rehab following a stroke in setting of dementia.  HISTORY OF PRESENT ILLNESS: NAME@ is a 89 y.o. year old female with chronic systolic congestive heart failure, persistent atrial fibrillation on anticoagulation in setting of dementia.  Pt had a fall and was unable to get up off the floor per son.  She was taken to ED and underwent extensive diagnostic testing then had to have sedation to obtain a CT scan.  Pt was d/c'd to home and had been home less than 24 hours when son noted that pt was having difficulty with moving her left arm.  She returned to the hospital with a diagnosis of acute CVA with left sided residual and she was outside of the window for tPa.  She has been noted to have significant difficulty with swallowing and with swallowing pills.  She continued to exhibit difficulty swallowing with finely chopped diet and is currently on pureed diet.  She was not eating well at home previously and son says she does not eat current diet well.  Spoke with therapy department at the facility and pt is 2-3 person max assist fully  dependent for transfers OOB to [facility], cannot use her left hand, has trouble remembering instructions to build off of and has difficulty with understanding and following through on instructions being given.  PT says when pt is distracted she does have ROM and strength in her left leg but cannot understand instructions being given to her.  ST has noted that it takes pt a long time to "chew" her food and she has been noted to pocket it.  Today pt is observed lying in bed with some slurring of her speech and she talks about "Nadene Rubins is coming."  When asked if she thinks he  is coming soon, she says "sometime on his way."  When asked if she knows where she is she responds "I never know where I am."  She begins to point and talk to her son [name] but he is outside the room where she cannot see him.  She is convinced he is standing in front of her and she needs him to "hold my hand while I'm looking for the sweater".    ACTIVITIES OF DAILY LIVING: CONTINENT OF BLADDER/BOWEL? No BATHING/DRESSING/FEEDING: total care  MOBILITY:   BEDBOUND with left sided neglect  APPETITE? poor WEIGHT: 154 lbs 3.2 oz as of 12/24/22 at the facility  CURRENT PROBLEM LIST:  Patient Active Problem List   Diagnosis Date Noted   Acute CVA (cerebrovascular accident) (HCC) 12/13/2022   Chronic pain 12/13/2022   Prolonged QT interval 12/13/2022   Dyslipidemia 09/07/2022   Essential hypertension 09/07/2022   Vitamin B12 deficiency 09/07/2022   Type 2 diabetes mellitus with chronic kidney disease, without long-term current use of insulin (HCC) 09/07/2022   Hard skin lesion 08/06/2022   Anxiety with agitation 07/06/2022   Stress due to family tension 07/06/2022   Pain of upper abdomen 12/12/2021   Difficulty swallowing pills 12/12/2021   Palliative care encounter 11/23/2021   Malnutrition of moderate degree 11/09/2021   Type 2 diabetes mellitus with hyperlipidemia (HCC) 11/08/2021   DNR (do not resuscitate) 11/08/2021   Leg cramps 09/03/2021   Acute renal failure superimposed on stage 3b chronic kidney disease (HCC)    Hyperlipidemia associated with type 2 diabetes mellitus (HCC)    Overweight (BMI 25.0-29.9)    Generalized weakness 06/07/2021   GERD (gastroesophageal reflux disease) 06/07/2021   Chronic systolic congestive heart failure (HCC) 05/05/2021   Anemia 04/13/2021   Symptomatic anemia 04/13/2021   Stage 3b chronic kidney disease (CKD) (HCC) 04/13/2021   Cognitive deficits    Pressure injury of skin 12/19/2020   COPD (chronic obstructive pulmonary disease) (HCC)  09/02/2020   Acute respiratory failure with hypoxia (HCC) 09/01/2020   Pulmonary nodule 1 cm or greater in diameter 01/03/2020   S/P TAVR (transcatheter aortic valve replacement) 05/03/2019   Primary osteoarthritis involving multiple joints 05/03/2019   Persistent atrial fibrillation (HCC) 05/03/2019   Bilateral carotid artery stenosis 12/01/2017   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/13/2014   Aortic valve disorder 05/03/2014   Chronic obstructive bronchitis without exacerbation 05/03/2014   Derangement of left knee 05/03/2014   Esophageal reflux 05/03/2014   HTN (hypertension) 05/03/2014   Vitamin D deficiency 05/03/2014   PAST MEDICAL HISTORY:  Active Ambulatory Problems    Diagnosis Date Noted   Aortic valve disorder 05/03/2014   Bilateral carotid artery stenosis 12/01/2017   Chronic obstructive bronchitis without exacerbation 05/03/2014   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/13/2014   Derangement of left knee 05/03/2014   Esophageal reflux 05/03/2014   HTN (  hypertension) 05/03/2014   Vitamin D deficiency 05/03/2014   S/P TAVR (transcatheter aortic valve replacement) 05/03/2019   Primary osteoarthritis involving multiple joints 05/03/2019   Persistent atrial fibrillation (HCC) 05/03/2019   Pulmonary nodule 1 cm or greater in diameter 01/03/2020   Acute respiratory failure with hypoxia (HCC) 09/01/2020   COPD (chronic obstructive pulmonary disease) (HCC) 09/02/2020   Pressure injury of skin 12/19/2020   Cognitive deficits    Anemia 04/13/2021   Symptomatic anemia 04/13/2021   Stage 3b chronic kidney disease (CKD) (HCC) 04/13/2021   Generalized weakness 06/07/2021   GERD (gastroesophageal reflux disease) 06/07/2021   Acute renal failure superimposed on stage 3b chronic kidney disease (HCC)    Hyperlipidemia associated with type 2 diabetes mellitus (HCC)    Overweight (BMI 25.0-29.9)    Leg cramps 09/03/2021   Type 2 diabetes mellitus with hyperlipidemia (HCC) 11/08/2021    DNR (do not resuscitate) 11/08/2021   Malnutrition of moderate degree 11/09/2021   Palliative care encounter 11/23/2021   Pain of upper abdomen 12/12/2021   Difficulty swallowing pills 12/12/2021   Chronic systolic congestive heart failure (HCC) 05/05/2021   Anxiety with agitation 07/06/2022   Stress due to family tension 07/06/2022   Hard skin lesion 08/06/2022   Dyslipidemia 09/07/2022   Essential hypertension 09/07/2022   Vitamin B12 deficiency 09/07/2022   Type 2 diabetes mellitus with chronic kidney disease, without long-term current use of insulin (HCC) 09/07/2022   Acute CVA (cerebrovascular accident) (HCC) 12/13/2022   Chronic pain 12/13/2022   Prolonged QT interval 12/13/2022   Resolved Ambulatory Problems    Diagnosis Date Noted   Pure hypercholesterolemia 05/03/2014   Acute exacerbation of CHF (congestive heart failure) (HCC) 12/18/2020   CHF (congestive heart failure) (HCC) 12/18/2020   Acute on chronic systolic CHF (congestive heart failure) (HCC) 01/29/2021   GI bleed 04/13/2021   COVID-19 virus infection 06/07/2021   Atrial fibrillation with rapid ventricular response (HCC) 09/02/2021   Lab test positive for detection of COVID-19 virus    Acute on chronic combined systolic and diastolic CHF (congestive heart failure) (HCC)    Elevated troponin 09/02/2021   UTI (urinary tract infection) 11/10/2021   Acute cystitis without hematuria 02/03/2022   Acute on chronic systolic (congestive) heart failure (HCC) 09/07/2022   Paroxysmal atrial fibrillation with RVR (HCC) 09/07/2022   Past Medical History:  Diagnosis Date   Aortic stenosis    CAD (coronary artery disease)    Diabetes mellitus (HCC)    Diverticulitis    Hyperlipidemia    Legally blind in left eye, as defined in [place]    Tobacco abuse    SOCIAL HX:  Social History   Tobacco Use   Smoking status: Former    Packs/day: 1.00    Years: 70.00    Additional pack years: 0.00    Total pack years: 70.00     Types: Cigarettes    Quit date: 12/15/2019    Years since quitting: 3.0   Smokeless tobacco: Never   Tobacco comments:    Pt states quiting a year ago. 01/03/20 [facility]  Substance Use Topics   Alcohol use: No    Alcohol/week: 0.0 standard drinks of alcohol   FAMILY HX:  Family History  Problem Relation Age of Onset   Diabetes Mother    Stroke Brother    Cerebral palsy Brother    Coronary artery disease Neg Hx        Preferred Pharmacy: ALLERGIES:  Allergies  Allergen Reactions   Amlodipine  Besy-Benazepril Hcl Cough   Hctz [Hydrochlorothiazide] Other (See Comments)    Per family, every diuretic the patient has been on flushes out her out and ultimately results in CRAMPING/JAUNDICE (added potassium might help)   Other Other (See Comments)    History of diverticulitis- CANNOT TOLERATE NUTS, CORN, AND ANY FOODS NOT EASILY DIGESTED- the patient AVOIDS these   Cyclobenzaprine Other (See Comments)    Confusion- does not tolerate well    Diclofenac Sodium Diarrhea   Hydralazine Other (See Comments)    Per family, every diuretic the patient has been on flushes out her out and ultimately results in CRAMPING/JAUNDICE (added potassium might help)   Methylprednisolone Other (See Comments)    Medrol/CORTICOSTEROIDS = A LOT OF ENERGY Delusions    Oxycodone Other (See Comments)    Patient becomes very disorientated      PERTINENT MEDICATIONS:  Outpatient Encounter Medications as of 12/27/2022  Medication Sig   acetaminophen (TYLENOL) 160 MG/5ML solution Take 15.6 mLs (500 mg total) by mouth 3 (three) times daily.   apixaban (ELIQUIS) 2.5 MG TABS tablet Take 1 tablet (2.5 mg total) by mouth 2 (two) times daily.   aspirin EC 81 MG tablet Take 1 tablet (81 mg total) by mouth daily. Swallow whole.   atorvastatin (LIPITOR) 40 MG tablet TAKE 1 TABLET BY MOUTH DAILY AT 6 PM. (Patient taking differently: Take 40 mg by mouth daily.)   cholecalciferol (VITAMIN D3) 25 MCG (1000 UNIT) tablet Take 1,000  Units by mouth daily.   feeding supplement (ENSURE ENLIVE / ENSURE PLUS) LIQD Take 237 mLs by mouth 2 (two) times daily between meals.   ferrous gluconate (FERGON) 324 MG tablet TAKE 1 TABLET BY MOUTH DAILY WITH BREAKFAST   fluticasone (FLONASE) 50 MCG/ACT nasal spray Place 2 sprays into both nostrils daily as needed for allergies or rhinitis.   HYDROcodone-acetaminophen (NORCO/VICODIN) 5-325 MG tablet Take 1 tablet by mouth every 6 (six) hours as needed for moderate pain.   lidocaine (LIDODERM) 5 % Place 1 patch onto the skin daily. Remove & Discard patch within 12 hours or as directed by MD   Lidocaine-Menthol, Spray, 4-1 % LIQD Apply 1 spray topically daily as needed (back/knee pain/restless legs).   Magnesium 200 MG TABS Take 200 mg by mouth every other day.   metoprolol succinate (TOPROL XL) 50 MG 24 hr tablet Take 1 tablet (50 mg total) by mouth 2 (two) times daily. Take with or immediately following a meal.   ondansetron (ZOFRAN) 4 MG tablet Take 4 mg by mouth every 6 (six) hours as needed for nausea or vomiting.   pantoprazole (PROTONIX) 40 MG tablet Take 1 tablet (40 mg total) by mouth daily at 12 noon.   polyethylene glycol (MIRALAX / GLYCOLAX) 17 g packet Take 17 g by mouth every 3 (three) days as needed for mild constipation.   psyllium (METAMUCIL) 58.6 % powder Take 1 packet by mouth daily as needed (for constipation).   sennosides-docusate sodium (SENOKOT-S) 8.6-50 MG tablet Take 1 tablet by mouth 2 (two) times daily.   sodium chloride (OCEAN) 0.65 % SOLN nasal spray Place 1 spray into both nostrils as needed for congestion.   torsemide (DEMADEX) 20 MG tablet Take 1 tablet (20 mg total) by mouth daily. Incase of weight gain 2 to 3 lbs in 24 hrs or 5 lbs in 7 days, take 2 tablets twice a day until weight back to baseline.   traMADol (ULTRAM) 50 MG tablet Take 1 tablet (50 mg total) by  mouth 3 (three) times daily.   traZODone (DESYREL) 50 MG tablet Take 25 mg by mouth at bedtime as  needed for sleep.   vitamin B-12 (CYANOCOBALAMIN) 1000 MCG tablet Take 1 tablet (1,000 mcg total) by mouth daily.   insulin aspart (NOVOLOG) 100 UNIT/ML injection 0-6 Units, Subcutaneous, 3 times daily with meals CBG < 70: Implement Hypoglycemia measures CBG 70 - 120: 0 units CBG 121 - 150: 0 units CBG 151 - 200: 1 unit CBG 201-250: 2 units CBG 251-300: 3 units CBG 301-350: 4 units CBG 351-400: 5 units CBG > 400: Give 6 units and call MD (Patient not taking: Reported on 12/27/2022)   spironolactone (ALDACTONE) 25 MG tablet Take 12.5 mg by mouth daily.   [DISCONTINUED] Coenzyme Q10 (CO Q-10) 300 MG CAPS Take 300 mg by mouth in the morning.   [DISCONTINUED] Misc Natural Products (OSTEO BI-FLEX ADV JOINT SHIELD PO) Take 1 tablet by mouth daily.   [DISCONTINUED] Multiple Vitamins-Minerals (CENTRUM MINIS WOMEN 50+ PO) Take 1 tablet by mouth daily.   [DISCONTINUED] Omega-3 Fatty Acids (FISH OIL) 1200 MG CAPS Take 1 capsule by mouth daily.   [DISCONTINUED] QUEtiapine (SEROQUEL) 25 MG tablet Take 12.5 mg by mouth at bedtime.   No facility-administered encounter medications on file as of 12/27/2022.    History obtained from review of EMR, discussion with primary team, and interview with family, facility staff/caregiver and/or patient.   CBC    Component Value Date/Time   WBC 6.1 12/12/2022 2215   RBC 3.60 (L) 12/12/2022 2215   HGB 11.1 (L) 12/12/2022 2215   HCT 33.9 (L) 12/12/2022 2215   PLT 172 12/12/2022 2215   MCV 94.2 12/12/2022 2215   MCV 91.8 01/25/2018 1428   MCH 30.8 12/12/2022 2215   MCHC 32.7 12/12/2022 2215   RDW 15.0 12/12/2022 2215   LYMPHSABS 0.7 12/12/2022 2215   MONOABS 0.6 12/12/2022 2215   EOSABS 0.1 12/12/2022 2215   BASOSABS 0.0 12/12/2022 2215    CMP    Latest Ref Rng & Units 12/12/2022   10:15 PM 12/11/2022    1:44 PM 12/11/2022    1:22 PM  CMP  Glucose 70 - 99 mg/dL 161  096  045   BUN 8 - 23 mg/dL 36  35  39   Creatinine 0.44 - 1.00 mg/dL 4.09  8.11  9.14    Sodium 135 - 145 mmol/L 137  139  138   Potassium 3.5 - 5.1 mmol/L 3.7  4.2  4.4   Chloride 98 - 111 mmol/L 100  104  101   CO2 22 - 32 mmol/L 22   24   Calcium 8.9 - 10.3 mg/dL 9.0   9.5   Total Protein 6.5 - 8.1 g/dL   6.7   Total Bilirubin 0.3 - 1.2 mg/dL   0.9   Alkaline Phos 38 - 126 U/L   72   AST 15 - 41 U/L   27   ALT 0 - 44 U/L   17     LFTs    Latest Ref Rng & Units 12/11/2022    1:22 PM 11/26/2022    1:20 PM 09/07/2022    5:43 PM  Hepatic Function  Total Protein 6.5 - 8.1 g/dL 6.7  7.1  6.7   Albumin 3.5 - 5.0 g/dL 3.6  4.1  3.8   AST 15 - 41 U/L 27  30  32   ALT 0 - 44 U/L 17  13  17    Alk  Phosphatase 38 - 126 U/L 72  76  86   Total Bilirubin 0.3 - 1.2 mg/dL 0.9  1.1  0.7     Urinalysis    Component Value Date/Time   COLORURINE YELLOW 11/26/2022 1415   APPEARANCEUR CLEAR 11/26/2022 1415   LABSPEC 1.009 11/26/2022 1415   PHURINE 8.0 11/26/2022 1415   GLUCOSEU NEGATIVE 11/26/2022 1415   GLUCOSEU NEGATIVE 12/11/2019 1033   HGBUR MODERATE (A) 11/26/2022 1415   BILIRUBINUR NEGATIVE 11/26/2022 1415   BILIRUBINUR neg 06/22/2022 1402   KETONESUR NEGATIVE 11/26/2022 1415   PROTEINUR NEGATIVE 11/26/2022 1415   UROBILINOGEN 0.2 06/22/2022 1402   UROBILINOGEN 0.2 12/11/2019 1033   NITRITE NEGATIVE 11/26/2022 1415   LEUKOCYTESUR MODERATE (A) 11/26/2022 1415    HGB A1c  6.6% (12/13/22),  6.7% (09/08/22), 6.8% (06/22/22)  BNP (last 3 results) Recent Labs    09/07/22 1744 11/26/22 1340 12/13/22 1219  BNP 1,839.2* 1,711.5* 2,586.7*   12/11/22 CT Chest, abd and pelvis w/contrast: IMPRESSION: 1. No CT evidence of acute traumatic injury to the chest, abdomen, or pelvis. 2. No fracture or dislocation of the thoracic or lumbar spine. 3. Unchanged post treatment/post radiation appearance of the suprahilar left lung. No evidence of lymphadenopathy or metastatic disease in the chest, abdomen, or pelvis. 4. Small bilateral pleural effusions and diffuse interlobular septal  thickening throughout the lungs, consistent with pulmonary edema. 5. Cardiomegaly and coronary artery disease. 6. Enlargement of the main pulmonary artery, as can be seen in pulmonary hypertension. 7. Emphysema.  12/12/22 CT head wo contrast: IMPRESSION: 1. No acute intracranial abnormality. 2. Small left parietal scalp contusion. No calvarial fracture. 3. Age-related cerebral volume loss with chronic small vessel ischemic disease.  12/13/22 MRI brain wo contrast: FINDINGS: Only diffusion imaging was acquired, and very much motion degraded, due to patient condition despite sedative medication. Especially on coronal acquisition small areas of cortex and white matter infarct or suspected at high right frontal lobe. No hydrocephalus or gross midline shift.   IMPRESSION: Very truncated and motion degraded study. Small acute infarct is still suspected at the high right frontal parietal junction.  12/14/22 CTA head and neck w/wo contrast: IMPRESSION: 1. 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 Reconstituted right ICA and ECA despite superimposed bulky calcified plaque throughout the right carotid bifurcation. Asymmetrically diminished Right ICA and right Anterior circulation enhancement, such that the right M2 and distal MCA branches are not well evaluated.   2. Widespread bulky calcified atherosclerosis elsewhere from the upper chest to the skull base, also notable for: - up to Moderate superimposed calcified plaque of both ICA siphons.   - Bulky calcified plaque at the Left ICA origin with estimated 64% stenosis. - High-grade stenosis of the Left Subclavian Artery, estimated 80% stenosis. - calcified plaque right PCA P3.   3. Partially visible layering pleural effusions, larger on the left. Pulmonary septal thickening compatible with Pulmonary Edema. Aortic Atherosclerosis (ICD10-I70.0) and Emphysema (ICD10-J43.9).   4. Broad-based posterior vertex scalp hematoma may be mildly  progressed since yesterday   11/10/21 Vitamin B12 level high at 1589   I reviewed available labs, medications, imaging, studies and related documents from the EMR.  Records reviewed and summarized above.   Physical Exam: GENERAL: NAD, looks older than stated age LUNGS: CTAB, no increased work of breathing.  Pt had O2 tubing off and was noted to have stable O2 sats off oxygen CARDIAC:  S1S2, IRIR with lusb murmur, No edema/cyanosis ABD:  hypo-active BS x 4 quads,  soft, non-tender EXTREMITIES: Normal ROM, left arm flaccid with hand partially flexed strength equal RUE and RLE NEURO:  Noted left sided weakness/neglect, noted minor facial drooping, dysarthria, noted cognitive impairment with visual hallucinations and responding to internal stimuli PSYCH:  non-anxious affect, Alert, oriented only to name  Thank you for the opportunity to participate in the care of [facility]. Please call our main office at [phone] if we [email or web address] be of additional [email or web address]    Joycelyn Man FNP-C  [email or web address] .[email or web address]  Phone:  [phone]

## 2022-12-28 DIAGNOSIS — R2689 Other abnormalities of gait and mobility: Secondary | ICD-10-CM | POA: Diagnosis not present

## 2022-12-28 DIAGNOSIS — R2681 Unsteadiness on feet: Secondary | ICD-10-CM | POA: Diagnosis not present

## 2022-12-28 DIAGNOSIS — Z7689 Persons encountering health services in other specified circumstances: Secondary | ICD-10-CM

## 2022-12-28 DIAGNOSIS — M6281 Muscle weakness (generalized): Secondary | ICD-10-CM | POA: Diagnosis not present

## 2022-12-28 DIAGNOSIS — R32 Unspecified urinary incontinence: Secondary | ICD-10-CM | POA: Diagnosis not present

## 2023-01-04 ENCOUNTER — Encounter: Payer: Self-pay | Admitting: Neurology

## 2023-01-06 ENCOUNTER — Encounter: Payer: Self-pay | Admitting: Cardiovascular Disease

## 2023-01-06 ENCOUNTER — Ambulatory Visit: Payer: Medicare Other | Admitting: Cardiovascular Disease

## 2023-01-06 ENCOUNTER — Encounter: Payer: Self-pay | Admitting: Adult Health

## 2023-01-11 ENCOUNTER — Encounter: Payer: Medicare Other | Admitting: Family Medicine

## 2023-01-12 ENCOUNTER — Other Ambulatory Visit: Payer: Self-pay | Admitting: Adult Health

## 2023-01-15 DEATH — deceased

## 2023-03-09 ENCOUNTER — Inpatient Hospital Stay: Payer: Medicare Other | Admitting: Neurology
